# Patient Record
Sex: Male | Born: 1950 | Race: Black or African American | Hispanic: No | Marital: Married | Smoking: Former smoker
Health system: Southern US, Community
[De-identification: ages and names within clinical notes are randomized; demographics above are authoritative.]

## PROBLEM LIST (undated history)

## (undated) DIAGNOSIS — I10 Essential (primary) hypertension: Secondary | ICD-10-CM

## (undated) DIAGNOSIS — E039 Hypothyroidism, unspecified: Secondary | ICD-10-CM

## (undated) DIAGNOSIS — I639 Cerebral infarction, unspecified: Secondary | ICD-10-CM

## (undated) DIAGNOSIS — E785 Hyperlipidemia, unspecified: Secondary | ICD-10-CM

## (undated) DIAGNOSIS — R7303 Prediabetes: Secondary | ICD-10-CM

## (undated) DIAGNOSIS — I6522 Occlusion and stenosis of left carotid artery: Secondary | ICD-10-CM

---

## 2007-02-28 DIAGNOSIS — I2510 Atherosclerotic heart disease of native coronary artery without angina pectoris: Secondary | ICD-10-CM

## 2007-02-28 HISTORY — DX: Atherosclerotic heart disease of native coronary artery without angina pectoris: I25.10

## 2007-06-28 ENCOUNTER — Ambulatory Visit: Payer: Self-pay | Admitting: Thoracic Surgery (Cardiothoracic Vascular Surgery)

## 2007-08-14 ENCOUNTER — Ambulatory Visit: Payer: Self-pay | Admitting: Thoracic Surgery (Cardiothoracic Vascular Surgery)

## 2019-03-11 DIAGNOSIS — R42 Dizziness and giddiness: Secondary | ICD-10-CM | POA: Insufficient documentation

## 2019-03-11 DIAGNOSIS — I160 Hypertensive urgency: Secondary | ICD-10-CM

## 2019-03-11 HISTORY — DX: Hypertensive urgency: I16.0

## 2019-03-13 DIAGNOSIS — I519 Heart disease, unspecified: Secondary | ICD-10-CM | POA: Insufficient documentation

## 2019-03-13 DIAGNOSIS — I5189 Other ill-defined heart diseases: Secondary | ICD-10-CM | POA: Insufficient documentation

## 2019-03-17 DIAGNOSIS — R55 Syncope and collapse: Secondary | ICD-10-CM | POA: Diagnosis not present

## 2019-03-17 DIAGNOSIS — R001 Bradycardia, unspecified: Secondary | ICD-10-CM | POA: Diagnosis not present

## 2019-03-17 DIAGNOSIS — E039 Hypothyroidism, unspecified: Secondary | ICD-10-CM | POA: Diagnosis not present

## 2019-03-18 DIAGNOSIS — I10 Essential (primary) hypertension: Secondary | ICD-10-CM | POA: Diagnosis not present

## 2019-03-18 DIAGNOSIS — R001 Bradycardia, unspecified: Secondary | ICD-10-CM | POA: Diagnosis not present

## 2019-03-18 DIAGNOSIS — E039 Hypothyroidism, unspecified: Secondary | ICD-10-CM | POA: Diagnosis not present

## 2019-03-18 DIAGNOSIS — R55 Syncope and collapse: Secondary | ICD-10-CM | POA: Diagnosis not present

## 2019-03-18 DIAGNOSIS — I2510 Atherosclerotic heart disease of native coronary artery without angina pectoris: Secondary | ICD-10-CM | POA: Diagnosis not present

## 2019-03-18 DIAGNOSIS — I517 Cardiomegaly: Secondary | ICD-10-CM | POA: Diagnosis not present

## 2019-03-18 DIAGNOSIS — E785 Hyperlipidemia, unspecified: Secondary | ICD-10-CM | POA: Diagnosis not present

## 2019-03-18 DIAGNOSIS — R9089 Other abnormal findings on diagnostic imaging of central nervous system: Secondary | ICD-10-CM

## 2019-03-19 DIAGNOSIS — R55 Syncope and collapse: Secondary | ICD-10-CM | POA: Diagnosis not present

## 2019-03-19 DIAGNOSIS — I2510 Atherosclerotic heart disease of native coronary artery without angina pectoris: Secondary | ICD-10-CM | POA: Diagnosis not present

## 2019-03-19 DIAGNOSIS — R001 Bradycardia, unspecified: Secondary | ICD-10-CM | POA: Diagnosis not present

## 2019-03-19 DIAGNOSIS — E039 Hypothyroidism, unspecified: Secondary | ICD-10-CM | POA: Diagnosis not present

## 2019-03-19 DIAGNOSIS — E785 Hyperlipidemia, unspecified: Secondary | ICD-10-CM | POA: Diagnosis not present

## 2019-03-19 DIAGNOSIS — I10 Essential (primary) hypertension: Secondary | ICD-10-CM | POA: Diagnosis not present

## 2019-03-20 ENCOUNTER — Encounter (HOSPITAL_COMMUNITY): Payer: Self-pay | Admitting: Internal Medicine

## 2019-03-20 ENCOUNTER — Inpatient Hospital Stay (HOSPITAL_COMMUNITY)
Admission: AD | Admit: 2019-03-20 | Discharge: 2019-04-03 | DRG: 023 | Disposition: A | Discharge status: Rehabilitation Facility | Payer: BC Managed Care – PPO | Source: Other Acute Inpatient Hospital | Attending: Infectious Disease | Admitting: Infectious Disease

## 2019-03-20 ENCOUNTER — Other Ambulatory Visit: Payer: Self-pay

## 2019-03-20 DIAGNOSIS — E1151 Type 2 diabetes mellitus with diabetic peripheral angiopathy without gangrene: Secondary | ICD-10-CM | POA: Diagnosis present

## 2019-03-20 DIAGNOSIS — R339 Retention of urine, unspecified: Secondary | ICD-10-CM | POA: Diagnosis not present

## 2019-03-20 DIAGNOSIS — R338 Other retention of urine: Secondary | ICD-10-CM | POA: Diagnosis not present

## 2019-03-20 DIAGNOSIS — I771 Stricture of artery: Secondary | ICD-10-CM

## 2019-03-20 DIAGNOSIS — I513 Intracardiac thrombosis, not elsewhere classified: Secondary | ICD-10-CM | POA: Diagnosis present

## 2019-03-20 DIAGNOSIS — I6522 Occlusion and stenosis of left carotid artery: Secondary | ICD-10-CM | POA: Diagnosis not present

## 2019-03-20 DIAGNOSIS — E1169 Type 2 diabetes mellitus with other specified complication: Secondary | ICD-10-CM | POA: Diagnosis not present

## 2019-03-20 DIAGNOSIS — I63519 Cerebral infarction due to unspecified occlusion or stenosis of unspecified middle cerebral artery: Secondary | ICD-10-CM | POA: Diagnosis not present

## 2019-03-20 DIAGNOSIS — R001 Bradycardia, unspecified: Secondary | ICD-10-CM | POA: Diagnosis not present

## 2019-03-20 DIAGNOSIS — I639 Cerebral infarction, unspecified: Secondary | ICD-10-CM | POA: Diagnosis present

## 2019-03-20 DIAGNOSIS — I6523 Occlusion and stenosis of bilateral carotid arteries: Secondary | ICD-10-CM | POA: Diagnosis present

## 2019-03-20 DIAGNOSIS — D62 Acute posthemorrhagic anemia: Secondary | ICD-10-CM | POA: Diagnosis not present

## 2019-03-20 DIAGNOSIS — R55 Syncope and collapse: Secondary | ICD-10-CM | POA: Diagnosis not present

## 2019-03-20 DIAGNOSIS — I25810 Atherosclerosis of coronary artery bypass graft(s) without angina pectoris: Secondary | ICD-10-CM | POA: Diagnosis not present

## 2019-03-20 DIAGNOSIS — R7303 Prediabetes: Secondary | ICD-10-CM | POA: Diagnosis present

## 2019-03-20 DIAGNOSIS — E669 Obesity, unspecified: Secondary | ICD-10-CM | POA: Diagnosis not present

## 2019-03-20 DIAGNOSIS — R140 Abdominal distension (gaseous): Secondary | ICD-10-CM

## 2019-03-20 DIAGNOSIS — I10 Essential (primary) hypertension: Secondary | ICD-10-CM | POA: Diagnosis present

## 2019-03-20 DIAGNOSIS — I6602 Occlusion and stenosis of left middle cerebral artery: Secondary | ICD-10-CM | POA: Diagnosis not present

## 2019-03-20 DIAGNOSIS — N179 Acute kidney failure, unspecified: Secondary | ICD-10-CM | POA: Diagnosis not present

## 2019-03-20 DIAGNOSIS — E876 Hypokalemia: Secondary | ICD-10-CM | POA: Diagnosis present

## 2019-03-20 DIAGNOSIS — Y839 Surgical procedure, unspecified as the cause of abnormal reaction of the patient, or of later complication, without mention of misadventure at the time of the procedure: Secondary | ICD-10-CM | POA: Diagnosis not present

## 2019-03-20 DIAGNOSIS — J95821 Acute postprocedural respiratory failure: Secondary | ICD-10-CM | POA: Diagnosis not present

## 2019-03-20 DIAGNOSIS — I671 Cerebral aneurysm, nonruptured: Secondary | ICD-10-CM

## 2019-03-20 DIAGNOSIS — R251 Tremor, unspecified: Secondary | ICD-10-CM | POA: Diagnosis not present

## 2019-03-20 DIAGNOSIS — E871 Hypo-osmolality and hyponatremia: Secondary | ICD-10-CM | POA: Diagnosis not present

## 2019-03-20 DIAGNOSIS — Z7982 Long term (current) use of aspirin: Secondary | ICD-10-CM

## 2019-03-20 DIAGNOSIS — Z79899 Other long term (current) drug therapy: Secondary | ICD-10-CM

## 2019-03-20 DIAGNOSIS — R4189 Other symptoms and signs involving cognitive functions and awareness: Secondary | ICD-10-CM | POA: Diagnosis not present

## 2019-03-20 DIAGNOSIS — Z20822 Contact with and (suspected) exposure to covid-19: Secondary | ICD-10-CM | POA: Diagnosis present

## 2019-03-20 DIAGNOSIS — N400 Enlarged prostate without lower urinary tract symptoms: Secondary | ICD-10-CM | POA: Diagnosis present

## 2019-03-20 DIAGNOSIS — Z951 Presence of aortocoronary bypass graft: Secondary | ICD-10-CM

## 2019-03-20 DIAGNOSIS — I6302 Cerebral infarction due to thrombosis of basilar artery: Secondary | ICD-10-CM

## 2019-03-20 DIAGNOSIS — R29701 NIHSS score 1: Secondary | ICD-10-CM | POA: Diagnosis present

## 2019-03-20 DIAGNOSIS — I951 Orthostatic hypotension: Secondary | ICD-10-CM | POA: Diagnosis not present

## 2019-03-20 DIAGNOSIS — I6381 Other cerebral infarction due to occlusion or stenosis of small artery: Principal | ICD-10-CM | POA: Diagnosis present

## 2019-03-20 DIAGNOSIS — E039 Hypothyroidism, unspecified: Secondary | ICD-10-CM | POA: Diagnosis present

## 2019-03-20 DIAGNOSIS — F17210 Nicotine dependence, cigarettes, uncomplicated: Secondary | ICD-10-CM | POA: Diagnosis present

## 2019-03-20 DIAGNOSIS — I5032 Chronic diastolic (congestive) heart failure: Secondary | ICD-10-CM | POA: Diagnosis present

## 2019-03-20 DIAGNOSIS — B351 Tinea unguium: Secondary | ICD-10-CM | POA: Diagnosis present

## 2019-03-20 DIAGNOSIS — R1312 Dysphagia, oropharyngeal phase: Secondary | ICD-10-CM | POA: Diagnosis present

## 2019-03-20 DIAGNOSIS — R269 Unspecified abnormalities of gait and mobility: Secondary | ICD-10-CM | POA: Diagnosis not present

## 2019-03-20 DIAGNOSIS — E782 Mixed hyperlipidemia: Secondary | ICD-10-CM | POA: Diagnosis not present

## 2019-03-20 DIAGNOSIS — R310 Gross hematuria: Secondary | ICD-10-CM | POA: Diagnosis not present

## 2019-03-20 DIAGNOSIS — I6609 Occlusion and stenosis of unspecified middle cerebral artery: Secondary | ICD-10-CM | POA: Diagnosis present

## 2019-03-20 DIAGNOSIS — I2510 Atherosclerotic heart disease of native coronary artery without angina pectoris: Secondary | ICD-10-CM | POA: Diagnosis present

## 2019-03-20 DIAGNOSIS — I6529 Occlusion and stenosis of unspecified carotid artery: Secondary | ICD-10-CM | POA: Diagnosis present

## 2019-03-20 DIAGNOSIS — I959 Hypotension, unspecified: Secondary | ICD-10-CM | POA: Diagnosis present

## 2019-03-20 DIAGNOSIS — Z9911 Dependence on respirator [ventilator] status: Secondary | ICD-10-CM

## 2019-03-20 DIAGNOSIS — L602 Onychogryphosis: Secondary | ICD-10-CM | POA: Diagnosis present

## 2019-03-20 DIAGNOSIS — I70201 Unspecified atherosclerosis of native arteries of extremities, right leg: Secondary | ICD-10-CM | POA: Diagnosis present

## 2019-03-20 DIAGNOSIS — N390 Urinary tract infection, site not specified: Secondary | ICD-10-CM | POA: Diagnosis not present

## 2019-03-20 DIAGNOSIS — I161 Hypertensive emergency: Secondary | ICD-10-CM | POA: Diagnosis not present

## 2019-03-20 DIAGNOSIS — F17200 Nicotine dependence, unspecified, uncomplicated: Secondary | ICD-10-CM | POA: Diagnosis not present

## 2019-03-20 DIAGNOSIS — I663 Occlusion and stenosis of cerebellar arteries: Secondary | ICD-10-CM | POA: Diagnosis not present

## 2019-03-20 DIAGNOSIS — R509 Fever, unspecified: Secondary | ICD-10-CM

## 2019-03-20 DIAGNOSIS — I69391 Dysphagia following cerebral infarction: Secondary | ICD-10-CM | POA: Diagnosis not present

## 2019-03-20 DIAGNOSIS — I69351 Hemiplegia and hemiparesis following cerebral infarction affecting right dominant side: Secondary | ICD-10-CM | POA: Diagnosis present

## 2019-03-20 DIAGNOSIS — I110 Hypertensive heart disease with heart failure: Secondary | ICD-10-CM | POA: Diagnosis present

## 2019-03-20 DIAGNOSIS — R1310 Dysphagia, unspecified: Secondary | ICD-10-CM | POA: Diagnosis present

## 2019-03-20 DIAGNOSIS — I998 Other disorder of circulatory system: Secondary | ICD-10-CM | POA: Diagnosis not present

## 2019-03-20 DIAGNOSIS — Z978 Presence of other specified devices: Secondary | ICD-10-CM

## 2019-03-20 DIAGNOSIS — R7401 Elevation of levels of liver transaminase levels: Secondary | ICD-10-CM | POA: Diagnosis not present

## 2019-03-20 DIAGNOSIS — Z823 Family history of stroke: Secondary | ICD-10-CM

## 2019-03-20 DIAGNOSIS — R319 Hematuria, unspecified: Secondary | ICD-10-CM | POA: Diagnosis not present

## 2019-03-20 DIAGNOSIS — G8191 Hemiplegia, unspecified affecting right dominant side: Secondary | ICD-10-CM | POA: Diagnosis not present

## 2019-03-20 DIAGNOSIS — E785 Hyperlipidemia, unspecified: Secondary | ICD-10-CM | POA: Diagnosis present

## 2019-03-20 DIAGNOSIS — R0689 Other abnormalities of breathing: Secondary | ICD-10-CM | POA: Diagnosis not present

## 2019-03-20 DIAGNOSIS — I69398 Other sequelae of cerebral infarction: Secondary | ICD-10-CM | POA: Diagnosis not present

## 2019-03-20 DIAGNOSIS — N401 Enlarged prostate with lower urinary tract symptoms: Secondary | ICD-10-CM | POA: Diagnosis not present

## 2019-03-20 DIAGNOSIS — I739 Peripheral vascular disease, unspecified: Secondary | ICD-10-CM | POA: Diagnosis not present

## 2019-03-20 DIAGNOSIS — I340 Nonrheumatic mitral (valve) insufficiency: Secondary | ICD-10-CM

## 2019-03-20 DIAGNOSIS — J9600 Acute respiratory failure, unspecified whether with hypoxia or hypercapnia: Secondary | ICD-10-CM | POA: Diagnosis not present

## 2019-03-20 HISTORY — DX: Prediabetes: R73.03

## 2019-03-20 HISTORY — DX: Occlusion and stenosis of left carotid artery: I65.22

## 2019-03-20 HISTORY — DX: Essential (primary) hypertension: I10

## 2019-03-20 HISTORY — DX: Hyperlipidemia, unspecified: E78.5

## 2019-03-20 HISTORY — DX: Hypothyroidism, unspecified: E03.9

## 2019-03-20 LAB — CREATININE, SERUM
Creatinine, Ser: 1.14 mg/dL (ref 0.61–1.24)
GFR calc Af Amer: >60 mL/min (ref >60)
GFR calc non Af Amer: >60 mL/min (ref >60)

## 2019-03-20 LAB — GLUCOSE, CAPILLARY: Glucose-Capillary: 111 mg/dL — ABNORMAL HIGH (ref 70–99)

## 2019-03-20 LAB — HIV ANTIBODY (ROUTINE TESTING W REFLEX): HIV Screen 4th Generation wRfx: NONREACTIVE

## 2019-03-20 MED ORDER — STROKE: EARLY STAGES OF RECOVERY BOOK
Freq: Once | Status: AC
  Filled 2019-03-20: qty 1

## 2019-03-20 MED ORDER — ACETAMINOPHEN 160 MG/5ML PO SOLN
650.0000 mg | ORAL | Status: DC | PRN
  Administered 2019-04-01: 650 mg
  Filled 2019-03-20: qty 20.3

## 2019-03-20 MED ORDER — SODIUM CHLORIDE 0.9 % IV SOLN: INTRAVENOUS | Status: AC

## 2019-03-20 MED ORDER — ACETAMINOPHEN 650 MG RE SUPP: 650.0000 mg | RECTAL | Status: DC | PRN

## 2019-03-20 MED ORDER — INSULIN ASPART 100 UNIT/ML ~~LOC~~ SOLN
0.0000 [IU] | SUBCUTANEOUS | Status: DC
  Administered 2019-03-24 – 2019-03-29 (×3): 1 [IU] via SUBCUTANEOUS

## 2019-03-20 MED ORDER — ACETAMINOPHEN 325 MG PO TABS
650.0000 mg | ORAL_TABLET | ORAL | Status: DC | PRN
  Administered 2019-03-23 – 2019-04-02 (×10): 650 mg via ORAL
  Filled 2019-03-20 (×10): qty 2

## 2019-03-20 MED ORDER — HEPARIN SODIUM (PORCINE) 5000 UNIT/ML IJ SOLN
5000.0000 [IU] | Freq: Three times a day (TID) | INTRAMUSCULAR | Status: DC
  Administered 2019-03-20 – 2019-03-21 (×2): 5000 [IU] via SUBCUTANEOUS
  Filled 2019-03-20 (×3): qty 1

## 2019-03-20 NOTE — H&P (Addendum)
TRH H&P    Patient Demographics:    Jeremy Sherman, is a 69 y.o. male  MRN: [id number]  DOB - 1950/06/12  Admit Date - 03/20/2019  Referring MD/NP/PA: Newman Pies  Outpatient Primary MD for the patient is Imagene Riches, NP  Patient coming from: [facility]  Chief complaint- dizziness   HPI:    Jeremy Sherman  is a 69 y.o. 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    In ED bp 158/77,   CT brain  03/17/2019 8x57mm focal slight dense lesion in the left and anterior callosal region.    MRI brain 03/17/19 Early subacute infarct within the right cerebellum and right brachium pontis  Punctate acute left parietal lobe cortical infarct  9x54mm lobular focus of enhancement in the pericallosal region w imaging features most suggestive of ACA pericallosal aneurysm, CTA recommended.     Chronic lacunar infarct in the right caudate nucleus  CTA head/ neck 4x7 mm anuerysm left pericallosal segment left ACA without evidence of rupture.   Mild stenosis of right M1 segment,  Moderate to severe stenosis left M1 segment poststenotic dilation.  Mild stenoss in the PCA bilaterally  Atherosclerotic disease, mural thrombus in the prox left ICA narrowing the lumen by 50%.  Eccentric atherosclerotic dsiease vs focal dissection in the left ICA below the skull base without significant stenosis  TEE 03/20/19 EF 45-50% , left atrial appendage thrombus, and mild MR  03/20/19 Wbc 4.7, hgb 12.8, Plt 200 INR 1.0 TSH 57.2 Bun 29, Creatinine 1.1   03/19/19 T. Chol 245, TG 139, Hdl 29, LD  188 Hga1c=6.2  03/17/19 Sars covid-2 PCR negative  Pt will be admitted for stroke as well as  L ICA 50% stenosis and M1 stenosis and ACA pericallosal aneurysm 4x43mm      Review of systems:    In addition to the HPI above,  No Fever-chills, No Headache, No changes with Vision or hearing, No problems swallowing food or Liquids, No Chest pain, Cough or Shortness of Breath, No Abdominal pain, No Nausea or Vomiting, bowel movements are regular, No Blood in stool or Urine, No dysuria, No new skin rashes or bruises, No new joints pains-aches,  No new weakness, tingling, numbness in any extremity, No recent weight gain or loss, No polyuria, polydypsia or polyphagia, No significant Mental Stressors.  All other systems reviewed and are negative.    Past History of the following :    Past Medical History:  Diagnosis Date  . CAD (coronary artery disease)   . Carotid stenosis, left    L ICA 50%  . Hyperlipidemia   . Hypertension   . Hypertensive urgency 03/11/2019  . 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 Prediabetes       Past Surgical History:  Procedure Laterality Date  .  CORONARY ARTERY BYPASS GRAFT  07/02/2007   [place]-> LAD SVG-> PD branch of RCA [name]) at [facility]      Social History:      Social History   Tobacco Use  . Smoking status: Current Every Day Smoker    Packs/day: 0.50    Years: 45.00    Pack years: 22.50    Types: Cigarettes    Start date: 02/27/1974  . Smokeless tobacco: Never Used  Substance Use Topics  . Alcohol use: Not Currently       Family History :     Family History  Problem Relation Age of Onset  . Stroke Mother       Home Medications:   Prior to Admission medications   Medication Sig Start Date End Date Taking? Authorizing Provider  aspirin EC 81 MG tablet Take 81 mg by mouth daily.   Yes [provider]  omega-3 acid ethyl esters (LOVAZA) 1 g capsule Take 1 g by mouth daily.    Yes [provider]     Allergies:    No Known Allergies   Physical Exam:   Vitals  Blood pressure (!) 146/78, pulse 61, temperature 98.6 F (37 C), temperature source Oral, resp. rate 18, height 6\' 5"  (1.956 m), weight 106.1 kg, SpO2 99 %.  1.  General:  axoxo3  2. Psychiatric: euthymic  3. Neurologic: Nonfocal, cn2-12 intact, reflexes 2+ symmetric, diffuse with no clonus, motor 5/5 in all 4 ext Good finger to nose  4. HEENMT:  Anicteric, + arcus senilis, pupils 1.24mm symmetric, direct, consensual intact Neck: no jvd  5. Respiratory : CTAB  6. Cardiovascular : rrr s1, s2,   7. Gastrointestinal:  Abd: soft, nt, nd, +bs  8. Skin:  Ext: no c/c/e,  + dry skin on bilateral lower ext Onychomycosis (severe)  9.Musculoskeletal:  Good ROM    Data Review:    CBC No results for input(s): WBC, HGB, HCT, PLT, MCV, MCH, MCHC, RDW, LYMPHSABS, MONOABS, EOSABS, BASOSABS, BANDABS in the last 168 hours.  Invalid input(s): NEUTRABS, BANDSABD ------------------------------------------------------------------------------------------------------------------  Results for orders placed or performed during the hospital encounter of 03/20/19 (from the past 48 hour(s))  HIV Antibody (routine testing w rflx)     Status: None   Collection Time: 03/20/19  8:08 PM  Result Value Ref Range   HIV Screen 4th Generation wRfx NON REACTIVE NON REACTIVE    Comment: Performed at [facility], [address], [place], [place] [address]  Creatinine, serum     Status: None   Collection Time: 03/20/19  8:08 PM  Result Value Ref Range   Creatinine, Ser 1.14 0.61 - 1.24 mg/dL   GFR calc non Af Amer >60 >60 mL/min   GFR calc Af Amer >60 >60 mL/min    Comment: Performed at [facility] [address] [place], [place] [address]  Glucose, capillary     Status: Abnormal   Collection Time: 03/20/19 11:54 PM  Result Value Ref Range   Glucose-Capillary 111 (H) 70 - 99 mg/dL    Comment 1 Notify RN     Chemistries  Recent Labs  Lab 03/20/19 2008  CREATININE 1.14   ------------------------------------------------------------------------------------------------------------------  ------------------------------------------------------------------------------------------------------------------ GFR: Estimated Creatinine Clearance: 76 mL/min (by C-G formula based on SCr of 1.14 mg/dL). Liver Function Tests: No results for input(s): AST, ALT, ALKPHOS, BILITOT, PROT, ALBUMIN in the last 168 hours. No results for input(s): LIPASE, AMYLASE in the last 168 hours.  No results for input(s): AMMONIA in the last 168 hours. Coagulation Profile: No results for input(s): INR, PROTIME in the last 168 hours. Cardiac Enzymes: No results for input(s): CKTOTAL, CKMB, CKMBINDEX, TROPONINI in the last 168 hours. BNP (last 3 results) No results for input(s): PROBNP in the last 8760 hours. HbA1C: No results for input(s): HGBA1C in the last 72 hours. CBG: Recent Labs  Lab 03/20/19 2354  GLUCAP 111*   Lipid Profile: No results for input(s): CHOL, HDL, LDLCALC, TRIG, CHOLHDL, LDLDIRECT in the last 72 hours. Thyroid Function Tests: No results for input(s): TSH, T4TOTAL, FREET4, T3FREE, THYROIDAB in the last 72 hours. Anemia Panel: No results for input(s): VITAMINB12, FOLATE, FERRITIN, TIBC, IRON, RETICCTPCT in the last 72 hours.  --------------------------------------------------------------------------------------------------------------- Urine analysis: No results found for: COLORURINE, APPEARANCEUR, LABSPEC, PHURINE, GLUCOSEU, HGBUR, BILIRUBINUR, KETONESUR, PROTEINUR, UROBILINOGEN, NITRITE, LEUKOCYTESUR    Imaging Results:    No results found.     Assessment & Plan:    Active Problems:   Stroke ([place]) \ Acute bilateral strokes Right cerebellum , right brachium pontis PT/OT/ speech therapy Cont aspirin Cont Plavix Cont Statin Please consult neurology in  AM  M1 stenosis, left ACA aneurysm, Mild L ICA stenosis NPO after MN Hydrate with ns at 70mL per hour Dr. Estanislado Pandy was notified that patient has arrived Anticipate angiogram in AM to evaluate M1 stenosis and L ACA aneurysm   L atrial appendage clot Cont aspirin Cont plavix Consider anticoagulation after angiogram Please consult cardiology in Am  Syncope Multifactorial secondar to stroke polypharmacy causing bradycardia, hypotension Permissive hypertension, consider tightening bp control in am  Bradycardia ? Carvedilol vs hypothyroidism Carvedilol on hold Cont telemetry  Hypothyroidism TSH significantly elevated Cont Levothyroxine  Smoker counselled on smoking cessation x 75minutes  CAD s/p CABG Cont aspirin Cont Lipitor      DVT Prophylaxis-   Heparin- SCDs  AM Labs Ordered, also please review Full Orders  Family Communication: Admission, patients condition and plan of care including tests being ordered have been discussed with the patient who indicate understanding and agree with the plan and Code Status.  Code Status:  FULL CODE per patient,  Notified wife that patient arrived at [facility]  Admission status:  Inpatient: Based on patients clinical presentation and evaluation of above clinical data, I have made determination that patient meets Inpatient criteria at this time.   Time spent in minutes : 70 minutes   Jani Gravel M.D on 03/21/2019 at 4:42 AM

## 2019-03-20 NOTE — Progress Notes (Signed)
New Admission Note:  Arrival Method: via [place] from [place] Orientation: Alert and oriented to person, place time and situation Telemetry: Box 35 NSR Assessment: Completed Skin: Clean, dry and flaky on bilateral legs. Old scar in center of chest from CABG 2009 IV: Left and Right AC Pain: No complaints of pain Safety Measures: Safety Fall Prevention Plan was given, discussed. Admission: Completed 3W: Patient has been orientated to the room, unit and the staff. Family: No family at bedside, pt states wife will be on the way soon  Orders have been reviewed and implemented. Will continue to monitor the patient. Call light has been placed within reach and bed alarm has been activated.   Janus Molder ,RN

## 2019-03-21 ENCOUNTER — Inpatient Hospital Stay (HOSPITAL_COMMUNITY): Payer: BC Managed Care – PPO

## 2019-03-21 ENCOUNTER — Encounter (HOSPITAL_COMMUNITY): Payer: Self-pay | Admitting: Internal Medicine

## 2019-03-21 DIAGNOSIS — I671 Cerebral aneurysm, nonruptured: Secondary | ICD-10-CM | POA: Diagnosis present

## 2019-03-21 DIAGNOSIS — I998 Other disorder of circulatory system: Secondary | ICD-10-CM

## 2019-03-21 DIAGNOSIS — I6529 Occlusion and stenosis of unspecified carotid artery: Secondary | ICD-10-CM | POA: Diagnosis present

## 2019-03-21 DIAGNOSIS — I639 Cerebral infarction, unspecified: Secondary | ICD-10-CM

## 2019-03-21 DIAGNOSIS — I2510 Atherosclerotic heart disease of native coronary artery without angina pectoris: Secondary | ICD-10-CM

## 2019-03-21 DIAGNOSIS — I739 Peripheral vascular disease, unspecified: Secondary | ICD-10-CM

## 2019-03-21 DIAGNOSIS — E039 Hypothyroidism, unspecified: Secondary | ICD-10-CM

## 2019-03-21 HISTORY — PX: IR ANGIO INTRA EXTRACRAN SEL COM CAROTID INNOMINATE BILAT MOD SED: IMG5360

## 2019-03-21 HISTORY — PX: IR ANGIO VERTEBRAL SEL SUBCLAVIAN INNOMINATE BILAT MOD SED: IMG5366

## 2019-03-21 LAB — COMPREHENSIVE METABOLIC PANEL
ALT: 16 U/L (ref 0–44)
AST: 23 U/L (ref 15–41)
Albumin: 3.5 g/dL (ref 3.5–5.0)
Alkaline Phosphatase: 61 U/L (ref 38–126)
Anion gap: 13 (ref 5–15)
BUN: 15 mg/dL (ref 8–23)
CO2: 25 mmol/L (ref 22–32)
Calcium: 8.9 mg/dL (ref 8.9–10.3)
Chloride: 98 mmol/L (ref 98–111)
Creatinine, Ser: 1.12 mg/dL (ref 0.61–1.24)
GFR calc Af Amer: >60 mL/min (ref >60)
GFR calc non Af Amer: >60 mL/min (ref >60)
Glucose, Bld: 94 mg/dL (ref 70–99)
Potassium: 3.2 mmol/L — ABNORMAL LOW (ref 3.5–5.1)
Sodium: 136 mmol/L (ref 135–145)
Total Bilirubin: 0.9 mg/dL (ref 0.3–1.2)
Total Protein: 7.4 g/dL (ref 6.5–8.1)

## 2019-03-21 LAB — CBC
HCT: 39.4 % (ref 39.0–52.0)
Hemoglobin: 13.4 g/dL (ref 13.0–17.0)
MCH: 31.8 pg (ref 26.0–34.0)
MCHC: 34 g/dL (ref 30.0–36.0)
MCV: 93.6 fL (ref 80.0–100.0)
Platelets: UNDETERMINED 10*3/uL (ref 150–400)
RBC: 4.21 MIL/uL — ABNORMAL LOW (ref 4.22–5.81)
RDW: 15 % (ref 11.5–15.5)
WBC: 4.7 10*3/uL (ref 4.0–10.5)
nRBC: 0 % (ref 0.0–0.2)

## 2019-03-21 LAB — GLUCOSE, CAPILLARY
Glucose-Capillary: 98 mg/dL (ref 70–99)
Glucose-Capillary: 86 mg/dL (ref 70–99)
Glucose-Capillary: 80 mg/dL (ref 70–99)
Glucose-Capillary: 68 mg/dL — ABNORMAL LOW (ref 70–99)
Glucose-Capillary: 93 mg/dL (ref 70–99)
Glucose-Capillary: 108 mg/dL — ABNORMAL HIGH (ref 70–99)

## 2019-03-21 LAB — LIPID PANEL
Cholesterol: 243 mg/dL — ABNORMAL HIGH (ref 0–200)
HDL: 37 mg/dL — ABNORMAL LOW (ref >40)
LDL Cholesterol: 177 mg/dL — ABNORMAL HIGH (ref 0–99)
Total CHOL/HDL Ratio: 6.6 RATIO
Triglycerides: 147 mg/dL (ref <150)
VLDL: 29 mg/dL (ref 0–40)

## 2019-03-21 LAB — HEMOGLOBIN A1C
Hgb A1c MFr Bld: 6.4 % — ABNORMAL HIGH (ref 4.8–5.6)
Mean Plasma Glucose: 136.98 mg/dL

## 2019-03-21 LAB — PROTIME-INR
INR: 1 (ref 0.8–1.2)
Prothrombin Time: 13.2 seconds (ref 11.4–15.2)

## 2019-03-21 MED ORDER — LIDOCAINE HCL 1 % IJ SOLN
INTRAMUSCULAR | Status: AC | PRN
  Administered 2019-03-21: 10 mL

## 2019-03-21 MED ORDER — POTASSIUM CHLORIDE 10 MEQ/100ML IV SOLN
10.0000 meq | INTRAVENOUS | Status: AC
  Administered 2019-03-21 (×2): 10 meq via INTRAVENOUS
  Filled 2019-03-21 (×2): qty 100

## 2019-03-21 MED ORDER — FENTANYL CITRATE (PF) 100 MCG/2ML IJ SOLN
INTRAMUSCULAR | Status: AC | PRN
  Administered 2019-03-21: 25 ug via INTRAVENOUS

## 2019-03-21 MED ORDER — MIDAZOLAM HCL 2 MG/2ML IJ SOLN
INTRAMUSCULAR | Status: AC | PRN
  Administered 2019-03-21: 1 mg via INTRAVENOUS

## 2019-03-21 MED ORDER — VERAPAMIL HCL 2.5 MG/ML IV SOLN
INTRAVENOUS | Status: AC
  Filled 2019-03-21: qty 2

## 2019-03-21 MED ORDER — IOHEXOL 300 MG/ML  SOLN
150.0000 mL | Freq: Once | INTRAMUSCULAR | Status: AC | PRN
  Administered 2019-03-21: 12:00:00 70 mL via INTRA_ARTERIAL

## 2019-03-21 MED ORDER — HEPARIN (PORCINE) 25000 UT/250ML-% IV SOLN
900.0000 [IU]/h | INTRAVENOUS | Status: DC
  Administered 2019-03-21: 23:00:00 1300 [IU]/h via INTRAVENOUS
  Administered 2019-03-22: 19:00:00 900 [IU]/h via INTRAVENOUS
  Filled 2019-03-21 (×4): qty 250

## 2019-03-21 MED ORDER — MIDAZOLAM HCL 2 MG/2ML IJ SOLN
INTRAMUSCULAR | Status: AC
  Filled 2019-03-21: qty 2

## 2019-03-21 MED ORDER — HEPARIN SODIUM (PORCINE) 1000 UNIT/ML IJ SOLN
INTRAMUSCULAR | Status: AC | PRN
  Administered 2019-03-21: 1000 [IU] via INTRAVENOUS

## 2019-03-21 MED ORDER — CLOPIDOGREL BISULFATE 75 MG PO TABS
75.0000 mg | ORAL_TABLET | Freq: Every day | ORAL | Status: DC
  Administered 2019-03-22 – 2019-03-25 (×4): 75 mg via ORAL
  Filled 2019-03-21 (×4): qty 1

## 2019-03-21 MED ORDER — NITROGLYCERIN 1 MG/10 ML FOR IR/CATH LAB
INTRA_ARTERIAL | Status: AC
  Filled 2019-03-21: qty 10

## 2019-03-21 MED ORDER — SODIUM CHLORIDE 0.9 % IV SOLN: INTRAVENOUS | Status: AC

## 2019-03-21 MED ORDER — ATORVASTATIN CALCIUM 80 MG PO TABS
80.0000 mg | ORAL_TABLET | Freq: Every day | ORAL | Status: DC
  Administered 2019-03-21 – 2019-03-25 (×5): 80 mg via ORAL
  Filled 2019-03-21 (×5): qty 1

## 2019-03-21 MED ORDER — ASPIRIN EC 81 MG PO TBEC
81.0000 mg | DELAYED_RELEASE_TABLET | Freq: Every day | ORAL | Status: DC
  Administered 2019-03-21 – 2019-03-22 (×2): 81 mg via ORAL
  Filled 2019-03-21 (×2): qty 1

## 2019-03-21 MED ORDER — LIDOCAINE HCL 1 % IJ SOLN
INTRAMUSCULAR | Status: AC
  Filled 2019-03-21: qty 20

## 2019-03-21 MED ORDER — LEVOTHYROXINE SODIUM 100 MCG PO TABS
100.0000 ug | ORAL_TABLET | Freq: Every day | ORAL | Status: DC
  Administered 2019-03-21 – 2019-03-26 (×6): 100 ug via ORAL
  Filled 2019-03-21 (×6): qty 1

## 2019-03-21 MED ORDER — FENTANYL CITRATE (PF) 100 MCG/2ML IJ SOLN
INTRAMUSCULAR | Status: AC
  Filled 2019-03-21: qty 2

## 2019-03-21 MED ORDER — HEPARIN SODIUM (PORCINE) 1000 UNIT/ML IJ SOLN
INTRAMUSCULAR | Status: AC
  Filled 2019-03-21: qty 1

## 2019-03-21 MED ORDER — IOHEXOL 300 MG/ML  SOLN
50.0000 mL | Freq: Once | INTRAMUSCULAR | Status: AC | PRN
  Administered 2019-03-21: 12:00:00 5 mL via INTRA_ARTERIAL

## 2019-03-21 MED ORDER — POTASSIUM CHLORIDE 10 MEQ/100ML IV SOLN: 10.0000 meq | INTRAVENOUS | Status: DC

## 2019-03-21 NOTE — Procedures (Signed)
S/P 4 vessel c erebral arteriogram  RT CFA approach. Findings. 1.severe 90% LT MCA M 1 stenosis. 2.Approx 9.7 mm x 7 mm LT ACA pericallosal aneurysm. 3. Approx 75% LT ICA prox stenosis. S.Abayomi Pattison MD

## 2019-03-21 NOTE — Consult Note (Addendum)
NEURO HOSPITALIST  CONSULT   Requesting Physician: Dr. Darrick Meigs    Chief Complaint: stroke  History obtained from:  Chart  HPI:                                                                                                                                         Jeremy Sherman is an 69 y.o. male with PMH HTN, HLD, CAD, MI, CABG ( 2009), smoke, hypothyroidism. Presented to [facility] from [facility] for arteriogram.  Patient does not remember much as to what brought him to [place]. He remembers that he passed out, but states it was when he was getting some medication. He is aware of why he was transferred to [facility] ED and that he has stroke on MRI. Per chart review of records from [place] 03/17/19: patient had an episode of dizziness and confusion at home. He went to PCP's office and became unresponsive, hypotensive and diaphoretic. BP was reported lower than 100. Patient was brought via EMS to [facility]. Patient was hospitalized at [place] (1/12-1/14 for hypertensive urgency. Presented with SBP's in 200's over 100's. Was given lisinopril and started on nicardipine drip with good control of pressures. Was discharged on amlodipine 5mg , 10 mg of lisinopril and  3.125 mg coreg with SBP 120-150's. [facility] revealed grade 2 DD. Severe LVH EF 50-55%. CTA: 4x7 mm aneurysm left pericallosal segment left AVA w/o rupture. Severe stenosis of left M1  03/20/19: Dr. Dora Sims was consulted for left cerebral artery aneurysm and severe M1 stenosis.  CT scan at [place]: showed 8X61mm focal slightly dense lesion within the left and anterior callosal region.  MRI suggested: anterior cerebral artery pericallosal aneurysm. Showed an early/ subacute infarct with in the right cerebellum in the right brachium pontis.     Past Medical History:  Diagnosis Date  . CAD (coronary artery disease)   . Carotid stenosis, left    L ICA 50%  . Hyperlipidemia    . Hypertension   . Hypertensive urgency 03/11/2019  . 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 Prediabetes     Past Surgical History:  Procedure Laterality Date  . CORONARY ARTERY BYPASS GRAFT  07/02/2007   [place]-> LAD SVG-> PD branch of RCA [name]) at [facility]    Family History  Problem Relation Age of Onset  . Stroke Mother        Social History:  reports that he has been smoking cigarettes. He started smoking about 45 years ago. He has a 22.50 pack-year smoking history. He has never used smokeless tobacco. He reports previous alcohol use. No history  on file for drug.  Allergies: No Known Allergies  Medications:                                                                                                                           Scheduled: . atorvastatin  80 mg Oral q1800  . clopidogrel  75 mg Oral Daily  . fentaNYL      . heparin      . insulin aspart  0-9 Units Subcutaneous Q4H  . levothyroxine  100 mcg Oral Q0600  . lidocaine      . midazolam      . nitroGLYCERIN      . verapamil       Continuous: . sodium chloride 75 mL/hr at 03/21/19 1628   [id number] **OR** acetaminophen (TYLENOL) oral liquid 160 mg/5 mL **OR** acetaminophen   ROS:                                                                                                                                       ROS was performed and is negative except as noted in HPI   General Examination:                                                                                                      Blood pressure 98/80, pulse (!) 59, temperature 98.1 F (36.7 C), temperature source Oral, resp. rate 12, height 6\' 5"  (1.956 m), weight 106.1 kg, SpO2 97 %.  Physical Exam  Constitutional: Appears well-developed and well-nourished.  Psych: Affect appropriate to situation Eyes: Normal external eye and conjunctiva. HENT: Normocephalic, no lesions, without obvious abnormality.    Musculoskeletal-no joint tenderness, or swelling Bilateral feet with discoloration and long toe Cardiovascular: Normal rate and regular rhythm.  Respiratory: Effort normal, non-labored breathing saturations WNL GI: Soft.  No distension. There is no tenderness.  Skin: WDI, bilaterally feet with dark scaly patches of skin.  Neurological Examination Mental Status: Alert, oriented, thought content appropriate.  Speech fluent without evidence of aphasia.  Able to follow commands without difficulty. Cranial Nerves: II:  Visual fields grossly normal,  III,IV, VI: ptosis not present, extra-ocular motions intact bilaterally, pupils equal, round, reactive to light and accommodation V,VII: smile symmetric, facial light touch sensation normal bilaterally VIII: hearing normal bilaterally IX,X: uvula rises midline XI: bilateral shoulder shrug XII: midline tongue extension Motor: Right : Upper extremity   5/5  Left:     Upper extremity   5/5  Lower extremity   5/5   Lower extremity   5/5 Tone and bulk:normal tone throughout; no atrophy noted Sensory:  light touch intact throughout, bilaterally Deep Tendon Reflexes: 3+ and symmetric patella, 2+ and symmetric biceps Plantars: Right: downgoing   Left: downgoing Cerebellar: No ataxia Gait: deferred   Lab Results: Basic Metabolic Panel: Recent Labs  Lab 03/20/19 2008 03/21/19 0413  NA  --  136  K  --  3.2*  CL  --  98  CO2  --  25  GLUCOSE  --  94  BUN  --  15  CREATININE 1.14 1.12  CALCIUM  --  8.9    CBC: Recent Labs  Lab 03/21/19 0413  WBC 4.7  HGB 13.4  HCT 39.4  MCV 93.6  PLT PLATELET CLUMPS NOTED ON SMEAR, UNABLE TO ESTIMATE    Lipid Panel: Recent Labs  Lab 03/21/19 0413  CHOL 243*  TRIG 147  HDL 37*  CHOLHDL 6.6  VLDL 29  LDLCALC 177*    CBG: Recent Labs  Lab 03/20/19 2354 03/21/19 0335 03/21/19 0745  GLUCAP 111* 98 86    Imaging: VAS [place] ABI WITH/WO TBI  Result Date: 03/21/2019 LOWER EXTREMITY  DOPPLER STUDY Indications: Peripheral artery disease, and cold right foot post op angio. High Risk Factors: Hypertension, hyperlipidemia, coronary artery disease.  Comparison Study: no prior Performing Technologist: Abram Sander RVS  Examination Guidelines: A complete evaluation includes at minimum, Doppler waveform signals and systolic blood pressure reading at the level of bilateral brachial, anterior tibial, and posterior tibial arteries, when vessel segments are accessible. Bilateral testing is considered an integral part of a complete examination. Photoelectric Plethysmograph (PPG) waveforms and toe systolic pressure readings are included as required and additional duplex testing as needed. Limited examinations for reoccurring indications may be performed as noted.  ABI Findings: +--------+------------------+-----+---------+-----------+ Right   Rt Pressure (mmHg)IndexWaveform Comment     +--------+------------------+-----+---------+-----------+ [id number]                    triphasic            +--------+------------------+-----+---------+-----------+ PTA                                     not audible +--------+------------------+-----+---------+-----------+ PERO                                                +--------+------------------+-----+---------+-----------+ DP                                      not audible +--------+------------------+-----+---------+-----------+ +--------+------------------+-----+---------+-----------+ Left    Lt Pressure (mmHg)IndexWaveform Comment     +--------+------------------+-----+---------+-----------+ [id number]  triphasic            +--------+------------------+-----+---------+-----------+ PTA                                     not audible +--------+------------------+-----+---------+-----------+ DP                                      not audible  +--------+------------------+-----+---------+-----------+  Summary: Right: Unable to obtain DP and PTA. Not audbile. Right arterial duplex showed occluded SFA and popliteal artery. Left: Unable to obtain DP and PTA. Not audbile.  *See table(s) above for measurements and observations.    Preliminary    VAS [place] LOWER EXTREMITY ARTERIAL DUPLEX  Result Date: 03/21/2019 LOWER EXTREMITY ARTERIAL DUPLEX STUDY Indications: Peripheral artery disease.  Current ABI: n/a Limitations: right foot cool post angio Comparison Study: no prior Performing Technologist: Abram Sander RVS  Examination Guidelines: A complete evaluation includes B-mode imaging, spectral Doppler, color Doppler, and power Doppler as needed of all accessible portions of each vessel. Bilateral testing is considered an integral part of a complete examination. Limited examinations for reoccurring indications may be performed as noted.  +-----------+--------+-----+--------+----------+--------+ RIGHT      PSV cm/sRatioStenosisWaveform  Comments +-----------+--------+-----+--------+----------+--------+ CFA Prox   108                  biphasic           +-----------+--------+-----+--------+----------+--------+ DFA        47                   monophasic         +-----------+--------+-----+--------+----------+--------+ SFA Prox                occluded                   +-----------+--------+-----+--------+----------+--------+ SFA Mid                 occluded                   +-----------+--------+-----+--------+----------+--------+ SFA Distal              occluded                   +-----------+--------+-----+--------+----------+--------+ POP Prox                occluded                   +-----------+--------+-----+--------+----------+--------+ ATA Distal 9                    monophasic         +-----------+--------+-----+--------+----------+--------+ PTA Distal 16                   monophasic          +-----------+--------+-----+--------+----------+--------+ PERO Distal10                   monophasic         +-----------+--------+-----+--------+----------+--------+   Summary: Right: Total occlusion noted in the superficial femoral artery. Total occlusion noted in the superficial femoral artery and/or popliteal artery. Total occlusion noted in the popliteal artery.  See table(s) above for measurements and observations.    Preliminary     ECHO at [place]: 1. Technically a  difficult study without suprasternal views 2. Severe concentric left ventricular hypertrophy 3. Mild global hypokinesis of LV 4. Overall left ventricular systolic function is mildly impaired with and EF between 45-50 5.psuedo normal LV filling pattern consistent with elevated LA pressure 6. The right atrium is moderately;y enlarged 7. 2 agitated saline contrast injections at rest and valsalvae w/o shunt 8. Flow patterns measured by doppler indicate systolic blunting and elevated LA pressure.     Bilateral cortid duplex: Right: minimal heterogenous plaque, with no significant stenosis. Left: heterogenous plaque and left carotid bifurcation. Peak velocity suggest less than 50% stenosis with ICA/CCA ration 50-69% stenosis   TEE:  1. Overall left ventricular systolic function  Mildly impaired EF between 45-50% 2. Right ventrical is normal 3. Left atrium not well visualized. There is a slot in the left atrial appendage. 4. Negative bubble study 5. Aortic valve is trileaflet and is mildly thickened. 6. Moderate aortic valve sclerosis 7. Mitral valve normal 8. Mild mitral regurgitation 9. Tricuspid valve appears structurally  normal 10. Pulmonic valve not well visualized 11. Pulmonic valve is normal 12. There is thrombus located in left atrial appendage 13. Pericardium is normal  Laurey Morale, MSN, NP-C [facility] [phone]  03/21/2019, 11:35 AM   Attending physician note to follow with  Assessment and plan .   Assessment: 69 y.o. male   with PMH HTN, HLD, CAD, MI, CABG ( 2009), smoking, hypothyroidism.  He has a small brachium pontis infarct as well as a left frontal infarct.  The left frontal infarct is punctate.  It is possible that these represent decompensated small vessel disease in the setting of syncope, but the cerebellar infarct is less likely.   Stroke Risk Factors - hyperlipidemia, hypertension and smoking    Recommendations:  -- Prophylactic therapy-aspirin 81 mg daily with Plavix 75 mg daily for 3 weeks followed by monotherapy. -- High intensity Statin  -- PT consult, OT consult, Speech consult --Follow-up recommendations of Dr. Estanislado Pandy --please page stroke NP  Or  PA  Or MD from 8am -4 pm  as this patient from this time will be  followed by the stroke.   You can look them up on [email or web address]  Password [email or web address]  Roland Rack, MD [facility] [phone]  If 7pm- 7am, please page neurology on call as listed in [facility].

## 2019-03-21 NOTE — Progress Notes (Signed)
Patient ID: Jeremy Sherman, male   DOB: 1950-10-24, 69 y.o.   MRN: [id number] INR. Post procedure Neurologically stable . Prior to the procedure RT DP and PT dopplerable . LT DP dopplerable with absent LT PT . Post procedure LT DP dopplerable  And Lt PT absent unchanged. RT DP and PT not dopplerable. Both feet warm to the ankles.. Patient not complaining   of any pain or paresthesias in the lower extremities. Both common femoral pulses palpable. Have requested vascular surgery consult regarding above findings.Marland Kitchen S.Keaira Whitehurst MD

## 2019-03-21 NOTE — Progress Notes (Addendum)
ANTICOAGULATION CONSULT NOTE - Initial Consult  Pharmacy Consult for Heparin Indication: Left Atrial Appendage Thrombus  No Known Allergies  Patient Measurements: Height: 6\' 5"  (195.6 cm) Weight: 233 lb 14.5 oz (106.1 kg) IBW/kg (Calculated) : 89.1 Heparin Dosing Weight: 106.1 kg   Vital Signs: Temp: 98.7 F (37.1 C) (01/22 1532) Temp Source: Oral (01/22 1532) BP: 149/70 (01/22 1532) Pulse Rate: 53 (01/22 1532)  Labs: Recent Labs    03/20/19 2008 03/21/19 0413  HGB  --  13.4  HCT  --  39.4  PLT  --  PLATELET CLUMPS NOTED ON SMEAR, UNABLE TO ESTIMATE  LABPROT  --  13.2  INR  --  1.0  CREATININE 1.14 1.12    Estimated Creatinine Clearance: 77.3 mL/min (by C-G formula based on SCr of 1.12 mg/dL).   Medical History: Past Medical History:  Diagnosis Date  . CAD (coronary artery disease)   . Carotid stenosis, left    L ICA 50%  . Hyperlipidemia   . Hypertension   . Hypertensive urgency 03/11/2019  . 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 Prediabetes     Assessment: Pharmacy is consulted to dose heparin in 69 yr old male with left atrial appendage thrombus shown on TEE from 03/20/19 at [facility]. MRI of brain on 03/17/19 showed old stroke (subacute infarct within right cerebellum and right brachium pontis, acute left parietal lobe cortical infarct, chronic lacunar infarct in right caudate nucleus), and enhancement suggestive of ACA pericallosal aneurysm. CTA showed middle cerebral artery stenosis; pt is S/P 4-vessel cerebral arteriogram this afternoon. Dr. Darrick Meigs indicated that Dr. Estanislado Pandy and Cardiology are okay with starting heparin infusion. I spoke with Dr. Estanislado Pandy re: heparin dose intensity and goal; he recommended using heparin level goal of 0.3-0.5 units/ml and using stroke dosing intensity (no bolus).  Pt also with right foot ischemia and was found to have absent peripheral pulses; Vascular Surgery has been consulted.  Pt was receiving SQ heparin 5000  units every 8 hrs here (last dose at 0700 today). Pt also rec'd heparin 1000 units IV X 1during arteriogram at [id number] today). H/H WNL; INR 1.0. Pt was not on anticoagulation PTA, per med rec (confirmed with pt).   Goal of Therapy:  Heparin level: 0.3-0.5 units/ml Monitor platelets by anticoagulation protocol: Yes   Plan:  Start heparin infusion at 1300 units/hr (no bolus) Monitor 6-hr heparin level Monitor daily heparin level, CBC Monitor for signs/symptoms of bleeding   Gillermina Hu, PharmD, BCPS, [facility] Clinical Pharmacist 03/21/2019,5:39 PM

## 2019-03-21 NOTE — Progress Notes (Signed)
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  Apparently presented to [facility] ER on 03/17/2019 for dizziness and syncope at the PCP office.  Patient was unresponsive, hypotensive and diaphoretic.  In ED bp 158/77,   CT brain  03/17/2019 8x16mm focal slight dense lesion in the left and anterior callosal region.    MRI brain 03/17/19 Early subacute infarct within the right cerebellum and right brachium pontis  Punctate acute left parietal lobe cortical infarct  9x83mm lobular focus of enhancement in the pericallosal region w imaging features most suggestive of ACA pericallosal aneurysm, CTA recommended.     Chronic lacunar infarct in the right caudate nucleus  CTA head/ neck 4x7 mm anuerysm left pericallosal segment left ACA without evidence of rupture.   Mild stenosis of right M1 segment,  Moderate to severe stenosis left M1 segment poststenotic dilation.  Mild stenoss in the PCA bilaterally  Atherosclerotic disease, mural thrombus in the prox left ICA narrowing the lumen by 50%.  Eccentric atherosclerotic dsiease vs focal dissection in the left ICA below the skull base without significant stenosis  TEE 03/20/19 EF 45-50% , left atrial appendage thrombus, and mild MR   Subjective   Patient seen and examined, underwent cerebral angiogram today.  Patient was found to have absent peripheral pulses in both feet, vascular surgery was consulted by neuro interventional radiology.   Assessment/Plan:     1. M1 stenosis, left ACA aneurysm, mild L ICA stenosis-patient underwent cerebral arteriogram per neuro interventional  neurology.  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  Plan for intervention early next week as per Dr. Estanislado Pandy.  Patient is currently on Plavix 75 mg p.o. daily.  2. Left atrial appendage thrombus-patient has left atrial appendage thrombus, seen on TEE from 03/20/2019 at [facility] and discussed with cardiologist Dr. Sallyanne Kuster, he recommends starting anticoagulation immediately if okay with neuro intervention radiology.  I called and discussed with Dr. Estanislado Pandy, he is okay with starting anticoagulation.  Will start heparin at this time in case patient needs procedure as per vascular surgery.  3. Right foot ischemia-patient was found to have absent peripheral pulses in right foot, distal surgery was consulted.  Dr. Eden Lathe has seen the patient and ordered duplex ultrasound of the right lower extremity with bilateral ABIs.  Further management as per vascular surgery.  4. Bilateral stroke, right cerebellum, right brachium pontis-patient was taking aspirin 81 mg daily at home.  He has been started here on Plavix, aspirin has not been restarted.  Continue statin.  Neurology has been consulted for further recommendations.  5. Syncope-multifactorial likely from polypharmacy causing bradycardia, hypotension, permissive hypertension.  Continue to monitor patient blood pressure in the hospital.  6. Bradycardia-likely from carvedilol which is on hold.  Patient also has hypothyroidism.  7. Hypothyroidism-TSH was significantly elevated 111 with T3 and T4 lower than normal.  On Synthroid 100 mcg daily.  Will recheck TSH, T3 and T4.  8. Hypokalemia-potassium was 3.2, replace potassium and check BMP in a.m.    SpO2: 100 % O2 Flow Rate (L/min): 2 L/min      CBG:  Recent Labs  Lab 03/20/19 2354 03/21/19 0335 03/21/19 0745 03/21/19 1406  GLUCAP 111* 98 86 80    CBC: Recent Labs  Lab 03/21/19 0413  WBC 4.7   HGB 13.4  HCT 39.4  MCV 93.6  PLT PLATELET CLUMPS NOTED ON SMEAR, UNABLE TO ESTIMATE    Basic Metabolic Panel: Recent Labs  Lab 03/20/19 2008 03/21/19 0413  NA  --  136  K  --  3.2*  CL  --  98  CO2  --  25  GLUCOSE  --  94  BUN  --  15  CREATININE 1.14 1.12  CALCIUM  --  8.9     Liver Function Tests: Recent Labs  Lab 03/21/19 0413  AST 23  ALT 16  ALKPHOS 61  BILITOT 0.9  PROT 7.4  ALBUMIN 3.5        DVT prophylaxis: Heparin  Code Status: Full code  Family Communication: No family at bedside  Disposition Plan: likely home when medically ready for discharge       Scheduled medications:  . atorvastatin  80 mg Oral q1800  . clopidogrel  75 mg Oral Daily  . fentaNYL      . heparin      . heparin  5,000 Units Subcutaneous Q8H  . insulin aspart  0-9 Units Subcutaneous Q4H  . levothyroxine  100 mcg Oral Q0600  . lidocaine      . midazolam      . nitroGLYCERIN      . verapamil        Consultants:  Neuro intervention radiology  Vascular surgery  Neurology  Procedures:  Cerebral arteriogram  Antibiotics:   Anti-infectives (From admission, onward)   None       Objective   Vitals:   03/21/19 1255 03/21/19 1305 03/21/19 1357 03/21/19 1532  BP: 132/74 137/76 139/88 (!) 149/70  Pulse: 62 62 (!) 58 (!) 53  Resp: 16 16 18 16   Temp:   98.4 F (36.9 C) 98.7 F (37.1 C)  TempSrc:   Oral Oral  SpO2: 100% 97% 99% 100%  Weight:      Height:        Intake/Output Summary (Last 24 hours) at 03/21/2019 1713 Last data filed at 03/21/2019 0551 Gross per 24 hour  Intake 311.95 ml  Output 200 ml  Net 111.95 ml    01/20 1901 - 01/22 0700 In: 312 [I.V.:312] Out: 200 [Urine:200]  Filed Weights   03/20/19 1842  Weight: 106.1 kg    Physical Examination:    General-appears in no acute distress  Heart-S1-S2, regular, no murmur auscultated  Lungs-clear to auscultation bilaterally, no wheezing or crackles  auscultated  Abdomen-soft, nontender, no organomegaly  Extremities-no edema in the lower extremities  Neuro-alert, oriented x3, no focal deficit noted     Studies:  VAS [place] ABI WITH/WO TBI  Result Date: 03/21/2019 LOWER EXTREMITY DOPPLER STUDY Indications: Peripheral artery disease, and cold right foot post op angio. High Risk Factors: Hypertension, hyperlipidemia, coronary artery disease.  Comparison Study: no prior Performing Technologist: Abram Sander RVS  Examination Guidelines: A complete evaluation includes at minimum, Doppler waveform signals and systolic blood pressure reading at the level of bilateral brachial, anterior tibial, and posterior tibial arteries, when vessel segments are accessible. Bilateral testing is considered an integral part of a complete examination. Photoelectric Plethysmograph (PPG) waveforms and toe systolic pressure readings are included as required and additional duplex testing as needed. Limited examinations for reoccurring indications may be performed as  noted.  ABI Findings: +--------+------------------+-----+---------+-----------+ Right   Rt Pressure (mmHg)IndexWaveform Comment     +--------+------------------+-----+---------+-----------+ [id number]                    triphasic            +--------+------------------+-----+---------+-----------+ PTA                                     not audible +--------+------------------+-----+---------+-----------+ PERO                                                +--------+------------------+-----+---------+-----------+ DP                                      not audible +--------+------------------+-----+---------+-----------+ +--------+------------------+-----+---------+-----------+ Left    Lt Pressure (mmHg)IndexWaveform Comment     +--------+------------------+-----+---------+-----------+ [id number]                    triphasic             +--------+------------------+-----+---------+-----------+ PTA                                     not audible +--------+------------------+-----+---------+-----------+ DP                                      not audible +--------+------------------+-----+---------+-----------+  Summary: Right: Unable to obtain DP and PTA. Not audbile. Right arterial duplex showed occluded SFA and popliteal artery. Left: Unable to obtain DP and PTA. Not audbile.  *See table(s) above for measurements and observations.    Preliminary    VAS [place] LOWER EXTREMITY ARTERIAL DUPLEX  Result Date: 03/21/2019 LOWER EXTREMITY ARTERIAL DUPLEX STUDY Indications: Peripheral artery disease.  Current ABI: n/a Limitations: right foot cool post angio Comparison Study: no prior Performing Technologist: Abram Sander RVS  Examination Guidelines: A complete evaluation includes B-mode imaging, spectral Doppler, color Doppler, and power Doppler as needed of all accessible portions of each vessel. Bilateral testing is considered an integral part of a complete examination. Limited examinations for reoccurring indications may be performed as noted.  +-----------+--------+-----+--------+----------+--------+ RIGHT      PSV cm/sRatioStenosisWaveform  Comments +-----------+--------+-----+--------+----------+--------+ CFA Prox   108                  biphasic           +-----------+--------+-----+--------+----------+--------+ DFA        47                   monophasic         +-----------+--------+-----+--------+----------+--------+ SFA Prox                occluded                   +-----------+--------+-----+--------+----------+--------+ SFA Mid                 occluded                   +-----------+--------+-----+--------+----------+--------+ SFA Distal  occluded                   +-----------+--------+-----+--------+----------+--------+ POP Prox                occluded                    +-----------+--------+-----+--------+----------+--------+ ATA Distal 9                    monophasic         +-----------+--------+-----+--------+----------+--------+ PTA Distal 16                   monophasic         +-----------+--------+-----+--------+----------+--------+ PERO Distal10                   monophasic         +-----------+--------+-----+--------+----------+--------+   Summary: Right: Total occlusion noted in the superficial femoral artery. Total occlusion noted in the superficial femoral artery and/or popliteal artery. Total occlusion noted in the popliteal artery.  See table(s) above for measurements and observations.    Preliminary      Admission status: Inpatient: Based on patients clinical presentation and evaluation of above clinical data, I have made determination that patient meets Inpatient criteria at this time.   Oswald Hillock   [facility] If 7PM-7AM, please contact night-coverage at [email or web address], Office  [phone]  password [email or web address]  03/21/2019, 5:13 PM  LOS: 1 day

## 2019-03-21 NOTE — Progress Notes (Signed)
SLP Cancellation Note  Patient Details Name: Franchesco Dubray MRN: [id number] DOB: [birth year]   Cancelled treatment:        Attempted to see pt for speech evaluation.  Pt transporting to IR imminently for procedure.  Will reatttempt as schedule permits.   Celedonio Savage, MA, [facility] Capo Office: [phone] 03/21/2019, 9:26 AM

## 2019-03-21 NOTE — Consult Note (Signed)
Chief Complaint: Patient was seen in consultation today for diagnostic cerebral angiogram  Referring Physician(s): Dr. Jani Gravel  Supervising Physician: Luanne Bras  Patient Status: [facility] - In-pt  History of Present Illness: Jeremy Sherman is a 69 y.o. male with a past medical history significant for hypothyroidism, HTN with recent admission for HTN urgency (02/2019) HLD, CAD s/p CABG (2009), ongoing tobacco use who presented to [facility] ED on 03/17/19 from his PCPs office where he was being seen for dizziness and syncope - he was found to be hypotensive, diaphoretic and minimally responsive. CT brain significant for 8 x 6 mm focal slight dense lesion in the left and anterior callosal region. MRI brain significant for early subacute infarct within the right cerebellum and right brachium pontis, punctate acute left parietal lobe cortical infarct, 9 x 7 mm lobular focus of enhancement in the pericallosal region with imaging features most suggestive of ACA pericallosal aneurysm and chronic lacunar infarct in the right caudate nucleus. CTA head/neck notable for 4 x 7 mm aneurysm left pericallosal segment left ACA without evidence of rupture, mild stenosis of right M1 segment, moderate to severe stenosis left M1 segment poststenotic dilation, mild stenosis in the PCA bilaterally, mural thrombus in the proximal left ICA narrowing the lumen by 50%. He was transferred to [facility] for further evaluation and management. NIR has been asked to perform a diagnostic cerebral angiogram for further treatment planning.  Patient sitting up in bed watching TV, just finished using the urinal. He states he feels well today, he is aware that he will be having a test on his head today but he does not remember what it was called. He does not remember if had any trouble with his speech or movement, just that he felt very poorly. He states understanding of the requested procedure and is agreeable to  proceed.   Past Medical History:  Diagnosis Date  . CAD (coronary artery disease)   . Carotid stenosis, left    L ICA 50%  . Hyperlipidemia   . Hypertension   . Hypertensive urgency 03/11/2019  . 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 Prediabetes     Past Surgical History:  Procedure Laterality Date  . CORONARY ARTERY BYPASS GRAFT  07/02/2007   [place]-> LAD SVG-> PD branch of RCA [name]) at [facility]    Allergies: Patient has no known allergies.  Medications: Prior to Admission medications   Medication Sig Start Date End Date Taking? Authorizing Provider  aspirin EC 81 MG tablet Take 81 mg by mouth daily.   Yes [provider]  omega-3 acid ethyl esters (LOVAZA) 1 g capsule Take 1 g by mouth daily.   Yes [provider]     Family History  Problem Relation Age of Onset  . Stroke Mother     Social History   Socioeconomic History  . Marital status: Married    Spouse name: Not on file  . Number of children: Not on file  . Years of education: Not on file  . Highest education level: Not on file  Occupational History  . Not on file  Tobacco Use  . Smoking status: Current Every Day Smoker    Packs/day: 0.50    Years: 45.00    Pack years: 22.50    Types: Cigarettes    Start date: 02/27/1974  . Smokeless tobacco: Never Used  Substance and Sexual Activity  . Alcohol use: Not Currently  . Drug  use: Not on file  . Sexual activity: Not on file  Other Topics Concern  . Not on file  Social History Narrative  . Not on file   Social Determinants of Health   Financial Resource Strain:   . Difficulty of Paying Living Expenses: Not on file  Food Insecurity:   . Worried About Charity fundraiser in the Last Year: Not on file  . Ran Out of Food in the Last Year: Not on file  Transportation Needs:   . Lack of Transportation (Medical): Not on file  . Lack of Transportation (Non-Medical): Not on file  Physical Activity:   .  Days of Exercise per Week: Not on file  . Minutes of Exercise per Session: Not on file  Stress:   . Feeling of Stress : Not on file  Social Connections:   . Frequency of Communication with Friends and Family: Not on file  . Frequency of Social Gatherings with Friends and Family: Not on file  . Attends Religious Services: Not on file  . Active Member of Clubs or Organizations: Not on file  . Attends Archivist Meetings: Not on file  . Marital Status: Not on file     Review of Systems: A 12 point ROS discussed and pertinent positives are indicated in the HPI above.  All other systems are negative.  Review of Systems  Constitutional: Negative for chills and fever.  Respiratory: Negative for cough and shortness of breath.   Cardiovascular: Negative for chest pain.  Gastrointestinal: Negative for abdominal pain, diarrhea, nausea and vomiting.  Skin: Negative for wound.  Neurological: Positive for dizziness (none currently). Negative for facial asymmetry, speech difficulty, weakness, light-headedness, numbness and headaches.  Psychiatric/Behavioral: Negative for confusion.    Vital Signs: BP 121/86 (BP Location: Right Arm)   Pulse (!) 54   Temp 98.1 F (36.7 C) (Oral)   Resp 16   Ht 6\' 5"  (1.956 m)   Wt 233 lb 14.5 oz (106.1 kg)   SpO2 98%   BMI 27.74 kg/m   Physical Exam Vitals and nursing note reviewed.  HENT:     Head: Normocephalic.     Mouth/Throat:     Mouth: Mucous membranes are moist.     Pharynx: Oropharynx is clear. No oropharyngeal exudate or posterior oropharyngeal erythema.  Cardiovascular:     Rate and Rhythm: Normal rate and regular rhythm.  Pulmonary:     Effort: Pulmonary effort is normal.     Breath sounds: Normal breath sounds.  Abdominal:     General: There is no distension.     Palpations: Abdomen is soft.     Tenderness: There is no abdominal tenderness.  Skin:    General: Skin is warm and dry.  Neurological:     Mental Status: He is  alert.   Alert, awake, and oriented x 3 Speech and comprehension in tact PERRL bilaterally EOMs withou nystagmus or subjective diplopia. Visual fields grossly intact No obvious facial asymmetry. Tongue midline Motor power full in all 4 extremities Negative pronator drift. Fine motor and coordination grossly in tact Gait not assessed Romberg not assessed Heel to toe not assessed Distal pulses not assessed    MD Evaluation Airway: WNL Heart: WNL Abdomen: WNL Chest/ Lungs: WNL ASA  Classification: 3 Mallampati/Airway Score: Two   Imaging: No results found.  Labs:  CBC: Recent Labs    03/21/19 0413  WBC 4.7  HGB 13.4  HCT 39.4  PLT PENDING  COAGS: Recent Labs    03/21/19 0413  INR 1.0    BMP: Recent Labs    03/20/19 2008 03/21/19 0413  NA  --  136  K  --  3.2*  CL  --  98  CO2  --  25  GLUCOSE  --  94  BUN  --  15  CALCIUM  --  8.9  CREATININE 1.14 1.12  GFRNONAA >60 >60  GFRAA >60 >60    LIVER FUNCTION TESTS: Recent Labs    03/21/19 0413  BILITOT 0.9  AST 23  ALT 16  ALKPHOS 61  PROT 7.4  ALBUMIN 3.5    TUMOR MARKERS: No results for input(s): AFPTM, CEA, CA199, CHROMGRNA in the last 8760 hours.  Assessment and Plan:  69 y/o M who [facility] ED on [facility]/17/19 with complaints of dizziness and syncope - he was found to be hypotensive, diaphoretic and poorly responsive. CTA head/neck notable for M1 stenosis, left ACA aneurysm and mild left ICA stenosis - NIR has been asked to perform a diagnostic cerebral angiogram to further guide treatment planning.  Plan to proceed with diagnostic angiogram today in NIR pending any emergent procedures.  Patient has been NPO since midnight and is to remain NPO until post procedure. Afebrile, WBC 4.7, hgb 13.4, plt clumped on smear, creatinine 1.12, INR 1.0.   Risks and benefits of diagnostic cerebral angiogram were discussed with the patient including, but not limited to bleeding,  infection, vascular injury, stroke, or contrast induced renal failure.  This interventional procedure involves the use of X-rays and because of the nature of the planned procedure, it is possible that we will have prolonged use of X-ray fluoroscopy. Potential radiation risks to you include (but are not limited to) the following: - A slightly elevated risk for cancer  several years later in life. This risk is typically less than 0.5% percent. This risk is low in comparison to the normal incidence of human cancer, which is 33% for women and 50% for men according to the [employer]. - Radiation induced injury can include skin redness, resembling a rash, tissue breakdown / ulcers and hair loss (which can be temporary or permanent).  The likelihood of either of these occurring depends on the difficulty of the procedure and whether you are sensitive to radiation due to previous procedures, disease, or genetic conditions.   IF your procedure requires a prolonged use of radiation, you will be notified and given written instructions for further action.  It is your responsibility to monitor the irradiated area for the 2 weeks following the procedure and to notify your physician if you are concerned that you have suffered a radiation induced injury.    All of the patient's questions were answered, patient is agreeable to proceed.  Consent signed and in IR binder.  Thank you for this interesting consult.  I greatly enjoyed meeting [employer] and look forward to participating in their care.  A copy of this report was sent to the requesting provider on this date.  Electronically Signed: Joaquim Nam, PA-C 03/21/2019, 9:27 AM   I spent a total of 40 Minutes  in face to face in clinical consultation, greater than 50% of which was counseling/coordinating care for diagnostic cerebral angiogram.

## 2019-03-21 NOTE — Progress Notes (Signed)
PT Cancellation Note  Patient Details Name: Jeremy Sherman MRN: [id number] DOB: 1950/12/20   Cancelled Treatment:    Reason Eval/Treat Not Completed: Patient at procedure or test/unavailable (IR).  Ellamae Sia, PT, DPT Acute Rehabilitation Services Pager [phone] Office [phone]    Willy Eddy 03/21/2019, 10:34 AM

## 2019-03-21 NOTE — Progress Notes (Signed)
Duplex and ABIs noted. Pt common femoral and profunda open. Most likely this is all chronic disease. At some point may need agram but currently asymptomatic.   If he develops pain in the foot or numbness or wound will do agram while in hospital otherwise will wait until he has recovered from his neuro event.  Will recheck in morning.  Ruta Hinds MD

## 2019-03-21 NOTE — Consult Note (Signed)
Referring Physician: Dr Estanislado Pandy  Patient name: Jeremy Sherman MRN: [id number] DOB: Dec 23, 1950 Sex: male  REASON FOR CONSULT: Right foot ischemia  HPI: Jeremy Sherman is a 69 y.o. male, who underwent right common femoral access for a diagnostic carotid angiogram and cerebral angiogram earlier today by Dr. Estanislado Pandy.  According to the patient's medical record he had dorsalis pedis and posterior tibial Doppler signal in the right foot prior to the procedure.  At the conclusion of the procedure he did not have Doppler signals in the right foot.  The patient currently has no symptoms of pain numbness tingling or lack of motion in the right foot.  He does not have numbness and tingling in the foot at baseline.  He has no symptoms in the left foot either.  Patient was admitted with a recent stroke.  Other medical problems include coronary artery disease hyperlipidemia hypertension all of which have been stable.  Currently patient has minimal symptoms from his stroke.  Past Medical History:  Diagnosis Date  . CAD (coronary artery disease)   . Carotid stenosis, left    L ICA 50%  . Hyperlipidemia   . Hypertension   . Hypertensive urgency 03/11/2019  . 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 Prediabetes    Past Surgical History:  Procedure Laterality Date  . CORONARY ARTERY BYPASS GRAFT  07/02/2007   [place]-> LAD SVG-> PD branch of RCA [name]) at [facility]    Family History  Problem Relation Age of Onset  . Stroke Mother     SOCIAL HISTORY: Social History   Socioeconomic History  . Marital status: Married    Spouse name: Not on file  . Number of children: Not on file  . Years of education: Not on file  . Highest education level: Not on file  Occupational History  . Not on file  Tobacco Use  . Smoking status: Current Every Day Smoker    Packs/day: 0.50    Years: 45.00    Pack years: 22.50    Types: Cigarettes    Start date:  02/27/1974  . Smokeless tobacco: Never Used  Substance and Sexual Activity  . Alcohol use: Not Currently  . Drug use: Not on file  . Sexual activity: Not on file  Other Topics Concern  . Not on file  Social History Narrative  . Not on file   Social Determinants of Health   Financial Resource Strain:   . Difficulty of Paying Living Expenses: Not on file  Food Insecurity:   . Worried About Charity fundraiser in the Last Year: Not on file  . Ran Out of Food in the Last Year: Not on file  Transportation Needs:   . Lack of Transportation (Medical): Not on file  . Lack of Transportation (Non-Medical): Not on file  Physical Activity:   . Days of Exercise per Week: Not on file  . Minutes of Exercise per Session: Not on file  Stress:   . Feeling of Stress : Not on file  Social Connections:   . Frequency of Communication with Friends and Family: Not on file  . Frequency of Social Gatherings with Friends and Family: Not on file  . Attends Religious Services: Not on file  . Active Member of Clubs or Organizations: Not on file  . Attends Archivist Meetings: Not on file  . Marital Status: Not on file  Intimate Partner Violence:   . Fear  of Current or Ex-Partner: Not on file  . Emotionally Abused: Not on file  . Physically Abused: Not on file  . Sexually Abused: Not on file    No Known Allergies  Current Facility-Administered Medications  Medication Dose Route Frequency Provider Last Rate Last Admin  . 0.9 %  sodium chloride infusion   Intravenous Continuous Deveshwar, Sanjeev, MD      . acetaminophen (TYLENOL) tablet 650 mg  650 mg Oral Q4H PRN Jani Gravel, MD       Or  . acetaminophen (TYLENOL) 160 MG/5ML solution 650 mg  650 mg Per Tube Q4H PRN Jani Gravel, MD       Or  . acetaminophen (TYLENOL) suppository 650 mg  650 mg Rectal Q4H PRN Jani Gravel, MD      . atorvastatin (LIPITOR) tablet 80 mg  80 mg Oral q1800 Jani Gravel, MD      . clopidogrel (PLAVIX) tablet 75 mg  75  mg Oral Daily Jani Gravel, MD      . fentaNYL (SUBLIMAZE) 100 MCG/2ML injection           . heparin 1000 UNIT/ML injection           . heparin injection 5,000 Units  5,000 Units Subcutaneous Q8H Jani Gravel, MD   5,000 Units at 03/21/19 0700  . insulin aspart (novoLOG) injection 0-9 Units  0-9 Units Subcutaneous Q4H Jani Gravel, MD      . levothyroxine (SYNTHROID) tablet 100 mcg  100 mcg Oral Q0600 Jani Gravel, MD   100 mcg at 03/21/19 0700  . lidocaine (XYLOCAINE) 1 % (with pres) injection           . midazolam (VERSED) 2 MG/2ML injection           . nitroGLYCERIN 100 mcg/mL intra-arterial injection           . verapamil (ISOPTIN) 2.5 MG/ML injection             ROS:   General:  No weight loss, Fever, chills  HEENT: No recent headaches, no nasal bleeding, no visual changes, no sore throat  Neurologic: + dizziness, blackouts, seizures. + recent symptoms of stroke or mini- stroke. No recent episodes of slurred speech, or temporary blindness.  Cardiac: No recent episodes of chest pain/pressure, no shortness of breath at rest.  + shortness of breath with exertion.  Denies history of atrial fibrillation or irregular heartbeat  Vascular: No history of rest pain in feet.  No history of claudication.  No history of non-healing ulcer, No history of DVT   Pulmonary: No home oxygen, no productive cough, no hemoptysis,  No asthma or wheezing  Musculoskeletal:  [ ]  Arthritis, [ ]  Low back pain,  [ ]  Joint pain  Hematologic:No history of hypercoagulable state.  No history of easy bleeding.  No history of anemia  Gastrointestinal: No hematochezia or melena,  No gastroesophageal reflux, no trouble swallowing  Urinary: [ ]  chronic Kidney disease, [ ]  on HD - [ ]  MWF or [ ]  TTHS, [ ]  Burning with urination, [ ]  Frequent urination, [ ]  Difficulty urinating;   Skin: No rashes  Psychological: No history of anxiety,  No history of depression   Physical Examination  Vitals:   03/21/19 1220 03/21/19  1255 03/21/19 1305 03/21/19 1357  BP: (!) 137/99 132/74 137/76 139/88  Pulse: 61 62 62 (!) 58  Resp: 16 16 16 18   Temp:    98.4 F (36.9 C)  TempSrc:  Oral  SpO2: 97% 100% 97% 99%  Weight:      Height:        Body mass index is 27.74 kg/m.  General:  Alert and oriented, no acute distress HEENT: Normal Neck: No bruit or JVD Pulmonary: Clear to auscultation bilaterally Cardiac: Regular Rate and Rhythm  Abdomen: Soft, non-tender, non-distended Skin: No rash, thickened scaly skin bilateral feet with thickened nails no ulcer Extremity Pulses:  2+ left femoral patient tender in the right groin so this was not pressed, he has no palpable pulses in either feet.  Temperature of feet is fairly symmetric right foot may be slightly cooler than the left.   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  Otherwise we will wait on his diagnostic testing before proceeding with further intervention.  Please keep the patient n.p.o. for now.   Ruta Hinds, MD [facility] Office: [phone] Pager: [phone]

## 2019-03-21 NOTE — Sedation Documentation (Signed)
Unable to doppler distal right leg extremity pulses. Dr. Toni Amend made aware and at bedside to assess pulses. Patient denies pain in leg, foot warm to touch.

## 2019-03-21 NOTE — Progress Notes (Signed)
ABI and Arterial duplex have been completed.   Preliminary results in CV Proc.   Jeremy Sherman 03/21/2019 4:12 PM

## 2019-03-21 NOTE — Sedation Documentation (Signed)
Right groin sheath removed, manual pressure being held a right groin site.

## 2019-03-21 NOTE — Progress Notes (Signed)
OT Cancellation Note  Patient Details Name: Dominiq Nealy MRN: [id number] DOB: 03/18/1950   Cancelled Treatment:    Reason Eval/Treat Not Completed: Patient not medically ready Pt on bed rest orders post sheath removal. Will continue to follow as available and appropriate to initiate OT POC.   Marland KitchenZenovia Jarred, MSOT, OTR/L Acute Rehabilitation Services [facility] Office Number: [phone]  Zenovia Jarred 03/21/2019, 2:11 PM

## 2019-03-22 LAB — GLUCOSE, CAPILLARY
Glucose-Capillary: 104 mg/dL — ABNORMAL HIGH (ref 70–99)
Glucose-Capillary: 105 mg/dL — ABNORMAL HIGH (ref 70–99)
Glucose-Capillary: 111 mg/dL — ABNORMAL HIGH (ref 70–99)
Glucose-Capillary: 95 mg/dL (ref 70–99)
Glucose-Capillary: 97 mg/dL (ref 70–99)
Glucose-Capillary: 98 mg/dL (ref 70–99)

## 2019-03-22 LAB — T4, FREE: Free T4: 0.64 ng/dL (ref 0.61–1.12)

## 2019-03-22 LAB — CBC
HCT: 37.7 % — ABNORMAL LOW (ref 39.0–52.0)
Hemoglobin: 12.7 g/dL — ABNORMAL LOW (ref 13.0–17.0)
MCH: 31.6 pg (ref 26.0–34.0)
MCHC: 33.7 g/dL (ref 30.0–36.0)
MCV: 93.8 fL (ref 80.0–100.0)
Platelets: 199 10*3/uL (ref 150–400)
RBC: 4.02 MIL/uL — ABNORMAL LOW (ref 4.22–5.81)
RDW: 15 % (ref 11.5–15.5)
WBC: 5.2 10*3/uL (ref 4.0–10.5)
nRBC: 0 % (ref 0.0–0.2)

## 2019-03-22 LAB — COMPREHENSIVE METABOLIC PANEL
ALT: 15 U/L (ref 0–44)
AST: 22 U/L (ref 15–41)
Albumin: 3.5 g/dL (ref 3.5–5.0)
Alkaline Phosphatase: 64 U/L (ref 38–126)
Anion gap: 12 (ref 5–15)
BUN: 11 mg/dL (ref 8–23)
CO2: 23 mmol/L (ref 22–32)
Calcium: 9 mg/dL (ref 8.9–10.3)
Chloride: 101 mmol/L (ref 98–111)
Creatinine, Ser: 1.06 mg/dL (ref 0.61–1.24)
GFR calc Af Amer: >60 mL/min (ref >60)
GFR calc non Af Amer: >60 mL/min (ref >60)
Glucose, Bld: 109 mg/dL — ABNORMAL HIGH (ref 70–99)
Potassium: 3.3 mmol/L — ABNORMAL LOW (ref 3.5–5.1)
Sodium: 136 mmol/L (ref 135–145)
Total Bilirubin: 0.7 mg/dL (ref 0.3–1.2)
Total Protein: 7.6 g/dL (ref 6.5–8.1)

## 2019-03-22 LAB — HEPARIN LEVEL (UNFRACTIONATED)
Heparin Unfractionated: 0.34 IU/mL (ref 0.30–0.70)
Heparin Unfractionated: 0.89 IU/mL — ABNORMAL HIGH (ref 0.30–0.70)
Heparin Unfractionated: 0.85 IU/mL — ABNORMAL HIGH (ref 0.30–0.70)

## 2019-03-22 LAB — TSH: TSH: 45.808 u[IU]/mL — ABNORMAL HIGH (ref 0.350–4.500)

## 2019-03-22 MED ORDER — POTASSIUM CHLORIDE CRYS ER 20 MEQ PO TBCR
40.0000 meq | EXTENDED_RELEASE_TABLET | Freq: Once | ORAL | Status: AC
  Administered 2019-03-22: 40 meq via ORAL
  Filled 2019-03-22: qty 2

## 2019-03-22 NOTE — Consult Note (Signed)
No complaints in feet.    Physical Exam:  Vitals:   03/21/19 2000 03/21/19 2321 03/22/19 0354 03/22/19 0752  BP: (!) 136/55 (!) 124/57 (!) 150/92 (!) 146/69  Pulse: 65 67 79 65  Resp: 17 18 17 17   Temp: 99.5 F (37.5 C) 98.3 F (36.8 C) 99.3 F (37.4 C) 98.7 F (37.1 C)  TempSrc: Oral Oral Oral Oral  SpO2: 100% 99% 98% 99%  Weight:      Height:        Feet cool symmetric 2+ femoral pulses  Severe lengthening of thickened nails  A: Severe PAD currently asymptomatic, chronice  P: I will arrange outpt follow up for pt with me in a few weeks  Protect feet.  Will be at high risk of limb loss with any wound or pressure ulcer  He would definitely benefit from Podiatry eval to address his toenails which are all about 2 inches long and at risk of snagging and creating a wound  Call if questions  Ruta Hinds, MD [facility] Office: [phone]

## 2019-03-22 NOTE — Progress Notes (Signed)
[place] for Heparin Indication: Left Atrial Appendage Thrombus  No Known Allergies  Patient Measurements: Height: 6\' 5"  (195.6 cm) Weight: 233 lb 14.5 oz (106.1 kg) IBW/kg (Calculated) : 89.1 Heparin Dosing Weight: 106.1 kg   Vital Signs: Temp: 98.7 F (37.1 C) (01/23 0752) Temp Source: Oral (01/23 0752) BP: 146/69 (01/23 0752) Pulse Rate: 65 (01/23 0752)  Labs: Recent Labs    03/20/19 2008 03/21/19 0413 03/22/19 0112 03/22/19 0725  HGB  --  13.4 12.7*  --   HCT  --  39.4 37.7*  --   PLT  --  PLATELET CLUMPS NOTED ON SMEAR, UNABLE TO ESTIMATE 199  --   LABPROT  --  13.2  --   --   INR  --  1.0  --   --   HEPARINUNFRC  --   --  0.34 0.89*  CREATININE 1.14 1.12 1.06  --     Estimated Creatinine Clearance: 81.7 mL/min (by C-G formula based on SCr of 1.06 mg/dL).   Medical History: Past Medical History:  Diagnosis Date  . CAD (coronary artery disease)   . Carotid stenosis, left    L ICA 50%  . Hyperlipidemia   . Hypertension   . Hypertensive urgency 03/11/2019  . 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 Prediabetes     Assessment: Pharmacy is consulted to dose heparin in 69 yr old male with left atrial appendage thrombus shown on TEE from 03/20/19 at [facility]. 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 -Heparin level above goal  Goal of Therapy:  Heparin level: 0.3-0.5 units/ml Monitor platelets by anticoagulation protocol: Yes   Plan:  -Decrease heparin to 1100 units/hr -Heparin level in 6 hours and daily wth CBC daily  Hildred Laser, PharmD Clinical Pharmacist **Pharmacist phone directory can now be found on [email or web address] ([email or web address]).  Listed under [place].

## 2019-03-22 NOTE — Progress Notes (Signed)
ANTICOAGULATION CONSULT NOTE  Pharmacy Consult for Heparin Indication: Left Atrial Appendage Thrombus  No Known Allergies  Patient Measurements: Height: 6\' 5"  (195.6 cm) Weight: 233 lb 14.5 oz (106.1 kg) IBW/kg (Calculated) : 89.1 Heparin Dosing Weight: 106.1 kg   Vital Signs: Temp: 98.3 F (36.8 C) (01/23 1535) Temp Source: Oral (01/23 1535) BP: 116/66 (01/23 1535) Pulse Rate: 75 (01/23 1535)  Labs: Recent Labs    03/20/19 2008 03/21/19 0413 03/22/19 0112 03/22/19 0725 03/22/19 1631  HGB  --  13.4 12.7*  --   --   HCT  --  39.4 37.7*  --   --   PLT  --  PLATELET CLUMPS NOTED ON SMEAR, UNABLE TO ESTIMATE 199  --   --   LABPROT  --  13.2  --   --   --   INR  --  1.0  --   --   --   HEPARINUNFRC  --   --  0.34 0.89* 0.85*  CREATININE 1.14 1.12 1.06  --   --     Estimated Creatinine Clearance: 81.7 mL/min (by C-G formula based on SCr of 1.06 mg/dL).   Medical History: Past Medical History:  Diagnosis Date  . CAD (coronary artery disease)   . Carotid stenosis, left    L ICA 50%  . Hyperlipidemia   . Hypertension   . Hypertensive urgency 03/11/2019  . 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 Prediabetes     Assessment: Pharmacy is consulted to dose heparin in 69 yr old male with left atrial appendage thrombus shown on TEE from 03/20/19 at [facility]. MRI of brain on 03/17/19 showed old stroke. He is S/P 4-vessel cerebral arteriogram.   -Heparin level remains above goal despite prior rate decrease  Goal of Therapy:  Heparin level: 0.3-0.5 units/ml Monitor platelets by anticoagulation protocol: Yes   Plan:  -Decrease heparin to 900 units/hr -Heparin level in 6 hours and daily wth CBC daily -Monitor for bleeding  Vertis Kelch, PharmD, [facility] PGY2 Cardiology Pharmacy Resident Phone [phone] 03/22/2019       5:28 PM  Please check [email or web address] for unit-specific pharmacist phone numbers

## 2019-03-22 NOTE — Progress Notes (Signed)
[place] for Heparin Indication: Left Atrial Appendage Thrombus   Assessment: Pharmacy is consulted to dose heparin in 69 yr old male with left atrial appendage thrombus shown on TEE from 03/20/19 at [facility].  Heparin level in goal range 0.34 units/ml Goal of Therapy:  Heparin level: 0.3-0.5 units/ml Monitor platelets by anticoagulation protocol: Yes   Plan:  Continue heparin at 1300 units/hr Monitor 6-hr heparin level to confirm Monitor daily heparin level, CBC Monitor for signs/symptoms of bleeding  Thanks for allowing pharmacy to be a part of this patient's care.  Excell Seltzer, PharmD Clinical Pharmacist  03/22/2019,1:58 AM

## 2019-03-22 NOTE — Progress Notes (Signed)
[facility]  PROGRESS NOTE  Jeremy Sherman [id number] DOB: 06-May-1950 DOA: 03/20/2019 PCP: Imagene Riches, NP   Brief HPI:   69 year old male with a history of hypothyroidism, hypertension, hyperlipidemia, prediabetes, CAD s/p CABG 07/02/2007, smoker who was recently mated at [facility] from 03/11/2019 to 03/13/2019 for dizziness thought to be due to hypertensive urgency.  Apparently presented to [facility] ER on 03/17/2019 for dizziness and syncope at the PCP office.  Patient was unresponsive, hypotensive and diaphoretic.  In ED bp 158/77,   CT brain  03/17/2019 8x40mm focal slight dense lesion in the left and anterior callosal region.    MRI brain 03/17/19 Early subacute infarct within the right cerebellum and right brachium pontis  Punctate acute left parietal lobe cortical infarct  9x52mm lobular focus of enhancement in the pericallosal region w imaging features most suggestive of ACA pericallosal aneurysm, CTA recommended.     Chronic lacunar infarct in the right caudate nucleus  CTA head/ neck 4x7 mm anuerysm left pericallosal segment left ACA without evidence of rupture.   Mild stenosis of right M1 segment,  Moderate to severe stenosis left M1 segment poststenotic dilation.  Mild stenoss in the PCA bilaterally  Atherosclerotic disease, mural thrombus in the prox left ICA narrowing the lumen by 50%.  Eccentric atherosclerotic dsiease vs focal dissection in the left ICA below the skull base without significant stenosis  TEE 03/20/19 EF 45-50% , left atrial appendage thrombus, and mild MR   Subjective   Patient seen and examined, vascular surgery patient and plan for intervention as outpatient.  Appreciate neuro input.   Assessment/Plan:     1. M1 stenosis, left ACA aneurysm, mild L ICA stenosis-patient underwent cerebral arteriogram per neuro interventional neurology.  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  Plan for intervention early next week as per Dr. Estanislado Pandy.  Patient is currently on Plavix 75 mg p.o. daily.  Also started on aspirin 81 mg daily per neurology.  2. Left atrial appendage thrombus-patient has left atrial appendage thrombus, seen on TEE from 03/20/2019 at [facility] and discussed with cardiologist Dr. Sallyanne Kuster, he recommends starting anticoagulation immediately if okay with neuro intervention radiology.  I called and discussed with Dr. Estanislado Pandy, he is okay with starting anticoagulation.  Patient started on heparin.    3. Right foot ischemia-patient was found to have absent peripheral pulses in right foot, vascular surgery was consulted.  Dr. Eden Lathe has seen the patient and ordered duplex ultrasound of the right lower extremity with bilateral ABIs.  He has severe PAD, chronic.  Patient is asymptomatic at this time.  No intervention planned in the hospital.  Patient to follow-up as outpatient.  4. Bilateral stroke, right cerebellum, right brachium pontis-patient was taking aspirin 81 mg daily at home.  He has been started here on Plavix, and aspirin. Continue statin. Neurology following.  5. Syncope-multifactorial likely from polypharmacy causing bradycardia, hypotension, permissive hypertension.  Continue to monitor patient blood pressure in the hospital.  6. Bradycardia-likely from carvedilol which is on hold.  Patient also has hypothyroidism.  7. Hypothyroidism-TSH was significantly elevated 111 with T3 and T4 lower than normal.  On Synthroid 100 mcg daily.  Recheck TSH--45.8, T3 and T4--0.64.  8. Hypokalemia-potassium is 3.3 today.  Replace potassium and follow BMP in a.m.    SpO2: 98 % O2 Flow Rate (L/min): 2 L/min      CBG:  Recent Labs  Lab 03/21/19 2057 03/21/19 2319 03/22/19 0351 03/22/19 0754 03/22/19 1146  GLUCAP 93 108* 97 95 105*    CBC: Recent Labs  Lab 03/21/19 0413 03/22/19 0112   WBC 4.7 5.2  HGB 13.4 12.7*  HCT 39.4 37.7*  MCV 93.6 93.8  PLT PLATELET CLUMPS NOTED ON SMEAR, UNABLE TO ESTIMATE 123XX123    Basic Metabolic Panel: Recent Labs  Lab 03/20/19 2008 03/21/19 0413 03/22/19 0112  NA  --  136 136  K  --  3.2* 3.3*  CL  --  98 101  CO2  --  25 23  GLUCOSE  --  94 109*  BUN  --  15 11  CREATININE 1.14 1.12 1.06  CALCIUM  --  8.9 9.0     Liver Function Tests: Recent Labs  Lab 03/21/19 0413 03/22/19 0112  AST 23 22  ALT 16 15  ALKPHOS 61 64  BILITOT 0.9 0.7  PROT 7.4 7.6  ALBUMIN 3.5 3.5        DVT prophylaxis: Heparin  Code Status: Full code  Family Communication: No family at bedside  Disposition Plan: likely home when medically ready for discharge       Scheduled medications:  . aspirin EC  81 mg Oral Daily  . atorvastatin  80 mg Oral q1800  . clopidogrel  75 mg Oral Daily  . insulin aspart  0-9 Units Subcutaneous Q4H  . levothyroxine  100 mcg Oral Q0600    Consultants:  Neuro intervention radiology  Vascular surgery  Neurology  Procedures:  Cerebral arteriogram  Antibiotics:   Anti-infectives (From admission, onward)   None       Objective   Vitals:   03/21/19 2321 03/22/19 0354 03/22/19 0752 03/22/19 1143  BP: (!) 124/57 (!) 150/92 (!) 146/69 (!) 169/72  Pulse: 67 79 65 62  Resp: 18 17 17 20   Temp: 98.3 F (36.8 C) 99.3 F (37.4 C) 98.7 F (37.1 C) 98.2 F (36.8 C)  TempSrc: Oral Oral Oral Oral  SpO2: 99% 98% 99% 98%  Weight:      Height:        Intake/Output Summary (Last 24 hours) at 03/22/2019 1315 Last data filed at 03/22/2019 0500 Gross per 24 hour  Intake 314.85 ml  Output 425 ml  Net -110.15 ml    01/21 1901 - 01/23 0700 In: 626.8 [P.O.:240; I.V.:386.8] Out: 625 [Urine:625]  Filed Weights   03/20/19 1842  Weight: 106.1 kg    Physical Examination:    General-appears in no acute distress  Heart-S1-S2, regular, no murmur auscultated  Lungs-clear to auscultation  bilaterally, no wheezing or crackles auscultated  Abdomen-soft, nontender, no organomegaly  Extremities-no edema in the lower extremities  Neuro-alert, oriented x3, no focal deficit noted     Studies:  VAS [place] ABI WITH/WO TBI  Result Date: 03/22/2019 LOWER EXTREMITY DOPPLER STUDY Indications: Peripheral artery disease, and cold right foot post op angio. High Risk Factors: Hypertension, hyperlipidemia, coronary artery disease.  Comparison Study: no prior Performing Technologist: Abram Sander RVS  Examination Guidelines: A complete evaluation includes at minimum, Doppler waveform signals and systolic blood pressure reading at the level of bilateral brachial, anterior tibial, and posterior tibial arteries, when vessel segments are accessible. Bilateral testing is considered an integral part of a complete examination. Photoelectric Plethysmograph (PPG) waveforms and toe systolic pressure readings are included as required and additional duplex testing as needed. Limited examinations for reoccurring indications may be performed as noted.  ABI Findings: +--------+------------------+-----+---------+-----------+  Right   Rt Pressure (mmHg)IndexWaveform Comment     +--------+------------------+-----+---------+-----------+ [id number]                    triphasic            +--------+------------------+-----+---------+-----------+ PTA                                     not audible +--------+------------------+-----+---------+-----------+ PERO                                                +--------+------------------+-----+---------+-----------+ DP                                      not audible +--------+------------------+-----+---------+-----------+ +--------+------------------+-----+---------+-----------+ Left    Lt Pressure (mmHg)IndexWaveform Comment     +--------+------------------+-----+---------+-----------+ [id number]                    triphasic             +--------+------------------+-----+---------+-----------+ PTA                                     not audible +--------+------------------+-----+---------+-----------+ DP                                      not audible +--------+------------------+-----+---------+-----------+  Summary: Right: Unable to obtain DP and PTA. Not audbile. Right arterial duplex showed occluded SFA and popliteal artery. Left: Unable to obtain DP and PTA. Not audbile. Monophasic doppler flow noted in AT PT and peroneal right leg on duplex  *See table(s) above for measurements and observations.  Electronically signed by Ruta Hinds MD on 03/22/2019 at 9:39:19 AM.   Final    VAS [place] LOWER EXTREMITY ARTERIAL DUPLEX  Result Date: 03/22/2019 LOWER EXTREMITY ARTERIAL DUPLEX STUDY Indications: Peripheral artery disease.  Current ABI: n/a Limitations: right foot cool post angio Comparison Study: no prior Performing Technologist: Abram Sander RVS  Examination Guidelines: A complete evaluation includes B-mode imaging, spectral Doppler, color Doppler, and power Doppler as needed of all accessible portions of each vessel. Bilateral testing is considered an integral part of a complete examination. Limited examinations for reoccurring indications may be performed as noted.  +-----------+--------+-----+--------+----------+--------+ RIGHT      PSV cm/sRatioStenosisWaveform  Comments +-----------+--------+-----+--------+----------+--------+ CFA Prox   108                  biphasic           +-----------+--------+-----+--------+----------+--------+ DFA        47                   monophasic         +-----------+--------+-----+--------+----------+--------+ SFA Prox                occluded                   +-----------+--------+-----+--------+----------+--------+ SFA Mid                 occluded                   +-----------+--------+-----+--------+----------+--------+  SFA Distal              occluded                    +-----------+--------+-----+--------+----------+--------+ POP Prox                occluded                   +-----------+--------+-----+--------+----------+--------+ ATA Distal 9                    monophasic         +-----------+--------+-----+--------+----------+--------+ PTA Distal 16                   monophasic         +-----------+--------+-----+--------+----------+--------+ PERO Distal10                   monophasic         +-----------+--------+-----+--------+----------+--------+   Summary: Right: Total occlusion noted in the superficial femoral artery. Total occlusion noted in the superficial femoral artery and/or popliteal artery. Total occlusion noted in the popliteal artery.  See table(s) above for measurements and observations. Electronically signed by Ruta Hinds MD on 03/22/2019 at 9:38:33 AM.    Final      Admission status: Inpatient: Based on patients clinical presentation and evaluation of above clinical data, I have made determination that patient meets Inpatient criteria at this time.   Oswald Hillock   [facility] If 7PM-7AM, please contact night-coverage at [email or web address], Office  [phone]  password [email or web address]  03/22/2019, 1:15 PM  LOS: 2 days

## 2019-03-22 NOTE — Progress Notes (Signed)
STROKE TEAM PROGRESS NOTE   INTERVAL HISTORY Patient sitting in bed, having lunch, no acute distress, neuro stable.  Had cerebral angiogram with Dr. Estanislado Pandy yesterday, planning to have procedure to fix aneurysm and M1 stenosis.  Has vascular surgery consultation to evaluate lower extremity PAD.  OBJECTIVE Vitals:   03/21/19 2000 03/21/19 2321 03/22/19 0354 03/22/19 0752  BP: (!) 136/55 (!) 124/57 (!) 150/92 (!) 146/69  Pulse: 65 67 79 65  Resp: 17 18 17 17   Temp: 99.5 F (37.5 C) 98.3 F (36.8 C) 99.3 F (37.4 C) 98.7 F (37.1 C)  TempSrc: Oral Oral Oral Oral  SpO2: 100% 99% 98% 99%  Weight:      Height:        CBC:  Recent Labs  Lab 03/21/19 0413 03/22/19 0112  WBC 4.7 5.2  HGB 13.4 12.7*  HCT 39.4 37.7*  MCV 93.6 93.8  PLT PLATELET CLUMPS NOTED ON SMEAR, UNABLE TO ESTIMATE 123XX123    Basic Metabolic Panel:  Recent Labs  Lab 03/21/19 0413 03/22/19 0112  NA 136 136  K 3.2* 3.3*  CL 98 101  CO2 25 23  GLUCOSE 94 109*  BUN 15 11  CREATININE 1.12 1.06  CALCIUM 8.9 9.0    Lipid Panel:     Component Value Date/Time   CHOL 243 (H) 03/21/2019 0413   TRIG 147 03/21/2019 0413   HDL 37 (L) 03/21/2019 0413   CHOLHDL 6.6 03/21/2019 0413   VLDL 29 03/21/2019 0413   LDLCALC 177 (H) 03/21/2019 0413   HgbA1c:  Lab Results  Component Value Date   HGBA1C 6.4 (H) 03/21/2019   Urine Drug Screen: No results found for: LABOPIA, COCAINSCRNUR, LABBENZ, AMPHETMU, THCU, LABBARB  Alcohol Level No results found for: ETH  IMAGING  VAS [place] ABI WITH/WO TBI 03/22/2019 Summary:  Right: Unable to obtain DP and PTA. Not audbile. Right arterial duplex showed occluded SFA and popliteal artery.  Left: Unable to obtain DP and PTA. Not audbile. Monophasic doppler flow noted in AT PT and peroneal right leg on duplex    VAS [place] LOWER EXTREMITY ARTERIAL DUPLEX 03/22/2019 Summary:  Right: Total occlusion noted in the superficial femoral artery and/or popliteal artery. Total occlusion  noted in the popliteal artery.     S/P 4 Vessel Cerebral Arteriogram - Dr Estanislado Pandy 03/21/2019 RT CFA approach. Findings. 1. Severe 90% LT MCA M 1 stenosis. 2. Approx 9.7 mm x 7 mm LT ACA pericallosal aneurysm. 3. Approx 75% LT ICA prox stenosis. S.Deveshwar MD   Transthoracic Echocardiogram  Left atrial appendage thrombus-patient has left atrial appendage thrombus, seen on TEE from 03/20/2019 at [facility]    ECG - not performed here   PHYSICAL EXAM  Temp:  [98.2 F (36.8 C)-99.5 F (37.5 C)] 98.3 F (36.8 C) (01/23 1535) Pulse Rate:  [62-79] 75 (01/23 1535) Resp:  [17-20] 17 (01/23 1535) BP: (116-169)/(55-92) 116/66 (01/23 1535) SpO2:  [96 %-100 %] 96 % (01/23 1535)  General - Well nourished, well developed, in no apparent distress.  Ophthalmologic - fundi not visualized due to noncooperation.  Cardiovascular - Regular rhythm and rate.  Mental Status -  Level of arousal and orientation to time, place, and person were intact. Language including expression, naming, repetition, comprehension was assessed and found intact. Fund of Knowledge was assessed and was intact.  Cranial Nerves II - XII - II - Visual field intact OU. III, IV, VI - Extraocular movements intact. V - Facial sensation intact bilaterally. VII - Facial movement  intact bilaterally. VIII - Hearing & vestibular intact bilaterally. X - Palate elevates symmetrically. XI - Chin turning & shoulder shrug intact bilaterally. XII - Tongue protrusion intact.  Motor Strength - The patient's strength was normal in all extremities and pronator drift was absent.  Bulk was normal and fasciculations were absent.   Motor Tone - Muscle tone was assessed at the neck and appendages and was normal.  Reflexes - The patient's reflexes were symmetrical in all extremities and he had no pathological reflexes.  Sensory - Light touch, temperature/pinprick were assessed and were symmetrical.    Coordination - The  patient had normal movements in the hands with no ataxia or dysmetria.  Tremor was absent.  Gait and Station - deferred.   ASSESSMENT/PLAN Jeremy Sherman is a 69 y.o. 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  He subsequently became diaphoretic, hypotensive and unresponsive and was taken to [facility] where an MRI revealed a stroke. He did not receive IV t-PA due to late presentation.  Stroke:  early/ subacute infarct with in the right cerebellar peduncle, likely small vessel disease.   CT head - OSH - showed 8X37mm focal slightly dense lesion within the left and anterior callosal region   MRI head - OSH - anterior cerebral artery pericallosal aneurysm. Showed an early/ subacute infarct with in the right cerebellum in the right brachium pontis.   CTA H&N - OSH - 4 x 7 mm aneurysm left pericallosal segment left ACA without evidence of rupture. Moderate to severe stenosis left M1 segment with poststenotic dilatation. Atherosclerotic disease/mural thrombus in the proximal left ICA 50% stenosis.  IR - severe 90% left MCA M1 stenosis.  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  75% left ICA proximal stenosis.  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   LDL - 177  HgbA1c - 6.4  VTE prophylaxis - SCDs and Heparin IV  aspirin 81 mg daily prior to admission, now on clopidogrel 75 mg daily and heparin IV  Patient counseled to be compliant with his antithrombotic medications  Ongoing aggressive stroke risk factor management  Therapy recommendations:  pending  Disposition:  Pending  LAA thrombus  TEE - 03/20/2019 OSH - Left atrial appendage thrombus  on heparin IV  Tele monitoring to evaluate for afib  Cardiology on  board  Recommend long term cardiac monitoring with cardiology to evaluate for afib. If afib found pt will need long term anticoagulation  Left M1 high grade stenosis  CTA head and neck moderate to severe stenosis left M1 with poststenotic dilatation  IR severe left M1 90% stenosis  On Plavix  Dr. Estanislado Pandy plan for left M1 stenting next week  Left ACA pericallosal segment aneurysm  CTA head and neck 4x7  IR 9.7x7  Dr. Laurence Ferrari plan for pipeline stent for aneurysm repair next week  Left ICA proximal stenosis  CT head and neck left ICA proximal 50% stenosis with soft plaque versus mural thrombus  IR 75% left ICA proximal stenosis  On Plavix and heparin IV  PAD  Pre and post IR procedure, but her lower extremity PAD found with Doppler  Vascular surgery consulted  Dr. Oneida Alar to follow as outpatient  Hypertension  Home BP meds: none listed  Current BP meds: none  Stable . Permissive hypertension (OK if < 220/120) but gradually normalize in 5-7 days  . Long-term BP goal 130-150 given extra and intracranial stenosis  Hyperlipidemia  Home Lipid lowering medication: Lovaza  LDL 177, goal < 70  Current lipid lowering medication: Lipitor 80 mg daily   Continue statin at discharge  Diabetes  Home diabetic meds: none   Current diabetic meds: SSI   HgbA1c 6.4, goal < 7.0  CBG monitoring  Close PCP follow-up  Tobacco abuse  Current smoker  Smoking cessation counseling provided  Pt is willing to quit  Other Stroke Risk Factors  Previous ETOH use  Family hx stroke (mother)  Coronary artery disease  Other Active Problems  Hypokalemia - 3.3 - supplemented  Hospital day # 2  Neurology will sign off. Please call with questions. Pt will follow up with stroke clinic Dr. Leonie Man at [facility] in about 4 weeks. Thanks for the consult.  Rosalin Hawking, MD PhD Stroke Neurology 03/22/2019 5:22 PM  I spent  35 minutes in total face-to-face time with the  patient, more than 50% of which was spent in counseling and coordination of care, reviewing test results, images and medication, and discussing the diagnosis of stroke, left atrium appendage thrombus, left ACA aneurysm, left M1 and ICA high-grade stenosis, PAD, treatment plan and potential prognosis. This patient's care requiresreview of multiple databases, neurological assessment, discussion with family, other specialists and medical decision making of high complexity.  I also discussed with Dr. Darrick Meigs.    To contact Stroke Continuity provider, please refer to [email or web address] After hours, contact General Neurology

## 2019-03-22 NOTE — Evaluation (Signed)
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    Clinical Impression   PTA pt living with spouse, fully independent and still working full time. At time of eval he presents with higher level balance and cognitive deficits. He is able to complete transfers and functional mobility beyond a household distance at min guard level without AD. He does lean to the R slightly and is overall generally weak. He appears to have decreased awareness into deficits that limits safety going home. Educated on use of 3:1 as shower seat for tub shower at home to prevent falls. Recommend HHOT at this time to help promote functional and independent baseline. Will continue to follow per POC listed below.     Follow Up Recommendations  Home health OT;Supervision/Assistance - 24 hour    Equipment Recommendations  3 in 1 bedside commode    Recommendations for Other Services       Precautions / Restrictions Precautions Precautions: Fall Restrictions Weight Bearing Restrictions: No      Mobility Bed Mobility Overal bed mobility: Needs Assistance Bed Mobility: Supine to Sit     Supine to sit: Supervision     General bed mobility comments: finishing working with PT and up in recliner  Transfers Overall transfer level: Needs assistance Equipment used: None Transfers: Sit to/from Stand Sit to Stand: Min guard         General transfer comment: min-guard for safety    Balance Overall balance assessment: Needs assistance Sitting-balance support: Feet supported;No upper extremity supported Sitting balance-Leahy Scale: Good     Standing balance support: No upper extremity  supported Standing balance-Leahy Scale: Fair Standing balance comment: mild light lean and difficulty with higher level balance                           ADL either performed or assessed with clinical judgement   ADL Overall ADL's : Needs assistance/impaired Eating/Feeding: Independent   Grooming: Supervision/safety;Standing   Upper Body Bathing: Modified independent;Sitting   Lower Body Bathing: Min guard;Sit to/from stand   Upper Body Dressing : Modified independent;Sitting   Lower Body Dressing: Supervision/safety;Sit to/from stand   Toilet Transfer: Economist and Hygiene: Minimal assistance Toileting - Water quality scientist Details (indicate cue type and reason): unaware of incontinence during session Tub/ Shower Transfer: Min guard;3 in [employer] Details (indicate cue type and reason): education given on use of 3:1 in tub shower Functional mobility during ADLs: Min guard General ADL Comments: pt presents with higher level balance deficits and cognitive deficits as well as decreased BADL activity tolerance     Vision Patient Visual Report: No change from baseline Vision Assessment?: Yes Eye Alignment: Within Functional Limits Ocular Range of Motion: Within Functional Limits Alignment/Gaze Preference: Within Defined Limits Tracking/Visual Pursuits: Able to track stimulus in all quads without difficulty Additional Comments: no visual impairments noted     Perception     Praxis      Pertinent Vitals/Pain Pain Assessment: No/denies pain     Hand Dominance Right   Extremity/Trunk Assessment Upper Extremity Assessment Upper Extremity Assessment: Overall WFL for tasks assessed   Lower Extremity Assessment Lower Extremity Assessment: Defer to PT evaluation  Cervical / Trunk Assessment Cervical / Trunk Assessment: Normal   Communication Communication Communication: No  difficulties   Cognition Arousal/Alertness: Awake/alert Behavior During Therapy: WFL for tasks assessed/performed Overall Cognitive Status: Impaired/Different from baseline Area of Impairment: Safety/judgement;Problem solving                         Safety/Judgement: Decreased awareness of deficits;Decreased awareness of safety   Problem Solving: Difficulty sequencing;Requires verbal cues General Comments: decreased awareness of imbalance, incontinence in bed, and wrapped up in IV line unknowingly   General Comments  HR 86 bpm after ambulation    Exercises     Shoulder Instructions      Home Living Family/patient expects to be discharged to:: Private residence Living Arrangements: Spouse/significant other Available Help at Discharge: Family;Available 24 hours/day Type of Home: House Home Access: Stairs to enter [employer] of Steps: 4 Entrance Stairs-Rails: None Home Layout: One level     Bathroom Shower/Tub: Teacher, early years/pre: Standard     Home Equipment: [employer] - single point   Additional Comments: works full time as a Glass blower/designer      Prior Functioning/Environment Level of Independence: Independent        Comments: drives        OT Problem List: Decreased strength;Decreased knowledge of use of DME or AE;Decreased activity tolerance;Decreased cognition;Impaired balance (sitting and/or standing)      OT Treatment/Interventions: Self-care/ADL training;Therapeutic exercise;Patient/family education;Balance training;Energy conservation;Therapeutic activities;DME and/or AE instruction    OT Goals(Current goals can be found in the care plan section) Acute Rehab OT Goals Patient Stated Goal: return to home and work OT Goal Formulation: With patient Time For Goal Achievement: 04/05/19 Potential to Achieve Goals: Good  OT Frequency: Min 2X/week   Barriers to D/C:            Co-evaluation              AM-PAC OT  "6 Clicks" Daily Activity     Outcome Measure Help from another person eating meals?: None Help from another person taking care of personal grooming?: None Help from another person toileting, which includes using toliet, bedpan, or urinal?: A Little Help from another person bathing (including washing, rinsing, drying)?: A Little Help from another person to put on and taking off regular upper body clothing?: None Help from another person to put on and taking off regular lower body clothing?: A Little 6 Click Score: 21   End of Session Nurse Communication: Mobility status  Activity Tolerance: Patient tolerated treatment well Patient left: in chair;with call bell/phone within reach;with chair alarm set  OT Visit Diagnosis: Unsteadiness on feet (R26.81);Other abnormalities of gait and mobility (R26.89);Muscle weakness (generalized) (M62.81);Other symptoms and signs involving cognitive function                Time: [id number] OT Time Calculation (min): 10 min Charges:  OT General Charges $OT Visit: 1 Visit OT Evaluation $OT Eval Moderate Complexity: 1 Mod  Zenovia Jarred, MSOT, OTR/L Acute Rehabilitation Services [facility] Office Number: [phone]  Zenovia Jarred 03/22/2019, 5:46 PM

## 2019-03-22 NOTE — Progress Notes (Signed)
Chief Complaint: Patient was seen today for follow up cerebral angiogram  Supervising Physician: Luanne Bras  Patient Status: [facility] - In-pt  Subjective: S/p cerebral angiogram with findings as noted. Post procedure concern for absent pedal pulses. Appreciate vascular consult and input. Pt without c/o this am, feels well. Remains on heparin gtt  Objective: Physical Exam: BP (!) 146/69 (BP Location: Left Arm)   Pulse 65   Temp 98.7 F (37.1 C) (Oral)   Resp 17   Ht 6\' 5"  (1.956 m)   Wt 106.1 kg   SpO2 99%   BMI 27.74 kg/m  Neuro intact (R)groin soft, NT, no hematoma Femoral pulses intact bilat. Legs warm to ankles Feet cool but symmetric.   Current Facility-Administered Medications:  .  acetaminophen (TYLENOL) tablet 650 mg, 650 mg, Oral, Q4H PRN **OR** acetaminophen (TYLENOL) 160 MG/5ML solution 650 mg, 650 mg, Per Tube, Q4H PRN **OR** acetaminophen (TYLENOL) suppository 650 mg, 650 mg, Rectal, Q4H PRN, Jani Gravel, MD .  aspirin EC tablet 81 mg, 81 mg, Oral, Daily, Greta Doom, MD, 81 mg at 03/22/19 0914 .  atorvastatin (LIPITOR) tablet 80 mg, 80 mg, Oral, q1800, Jani Gravel, MD, 80 mg at 03/21/19 1842 .  clopidogrel (PLAVIX) tablet 75 mg, 75 mg, Oral, Daily, Jani Gravel, MD, 75 mg at 03/22/19 0914 .  heparin ADULT infusion 100 units/mL (25000 units/247mL sodium chloride 0.45%), 1,300 Units/hr, Intravenous, Continuous, Oswald Hillock, MD, Last Rate: 13 mL/hr at 03/21/19 2313, 1,300 Units/hr at 03/21/19 2313 .  insulin aspart (novoLOG) injection 0-9 Units, 0-9 Units, Subcutaneous, Q4H, Jani Gravel, MD .  levothyroxine (SYNTHROID) tablet 100 mcg, 100 mcg, Oral, Q0600, Jani Gravel, MD, 100 mcg at 03/22/19 0700  Labs: CBC Recent Labs    03/21/19 0413 03/22/19 0112  WBC 4.7 5.2  HGB 13.4 12.7*  HCT 39.4 37.7*  PLT PLATELET CLUMPS NOTED ON SMEAR, UNABLE TO ESTIMATE 199   BMET Recent Labs    03/21/19 0413 03/22/19 0112  NA 136 136  K 3.2* 3.3*  CL  98 101  CO2 25 23  GLUCOSE 94 109*  BUN 15 11  CREATININE 1.12 1.06  CALCIUM 8.9 9.0   LFT Recent Labs    03/22/19 0112  PROT 7.6  ALBUMIN 3.5  AST 22  ALT 15  ALKPHOS 64  BILITOT 0.7   PT/INR Recent Labs    03/21/19 0413  LABPROT 13.2  INR 1.0     Studies/Results: VAS [place] ABI WITH/WO TBI  Result Date: 03/21/2019 LOWER EXTREMITY DOPPLER STUDY Indications: Peripheral artery disease, and cold right foot post op angio. High Risk Factors: Hypertension, hyperlipidemia, coronary artery disease.  Comparison Study: no prior Performing Technologist: Abram Sander RVS  Examination Guidelines: A complete evaluation includes at minimum, Doppler waveform signals and systolic blood pressure reading at the level of bilateral brachial, anterior tibial, and posterior tibial arteries, when vessel segments are accessible. Bilateral testing is considered an integral part of a complete examination. Photoelectric Plethysmograph (PPG) waveforms and toe systolic pressure readings are included as required and additional duplex testing as needed. Limited examinations for reoccurring indications may be performed as noted.  ABI Findings: +--------+------------------+-----+---------+-----------+ Right   Rt Pressure (mmHg)IndexWaveform Comment     +--------+------------------+-----+---------+-----------+ [id number]                    triphasic            +--------+------------------+-----+---------+-----------+ PTA  not audible +--------+------------------+-----+---------+-----------+ PERO                                                +--------+------------------+-----+---------+-----------+ DP                                      not audible +--------+------------------+-----+---------+-----------+ +--------+------------------+-----+---------+-----------+ Left    Lt Pressure (mmHg)IndexWaveform Comment      +--------+------------------+-----+---------+-----------+ [id number]                    triphasic            +--------+------------------+-----+---------+-----------+ PTA                                     not audible +--------+------------------+-----+---------+-----------+ DP                                      not audible +--------+------------------+-----+---------+-----------+  Summary: Right: Unable to obtain DP and PTA. Not audbile. Right arterial duplex showed occluded SFA and popliteal artery. Left: Unable to obtain DP and PTA. Not audbile.  *See table(s) above for measurements and observations.    Preliminary    VAS [place] LOWER EXTREMITY ARTERIAL DUPLEX  Result Date: 03/21/2019 LOWER EXTREMITY ARTERIAL DUPLEX STUDY Indications: Peripheral artery disease.  Current ABI: n/a Limitations: right foot cool post angio Comparison Study: no prior Performing Technologist: Abram Sander RVS  Examination Guidelines: A complete evaluation includes B-mode imaging, spectral Doppler, color Doppler, and power Doppler as needed of all accessible portions of each vessel. Bilateral testing is considered an integral part of a complete examination. Limited examinations for reoccurring indications may be performed as noted.  +-----------+--------+-----+--------+----------+--------+ RIGHT      PSV cm/sRatioStenosisWaveform  Comments +-----------+--------+-----+--------+----------+--------+ CFA Prox   108                  biphasic           +-----------+--------+-----+--------+----------+--------+ DFA        47                   monophasic         +-----------+--------+-----+--------+----------+--------+ SFA Prox                occluded                   +-----------+--------+-----+--------+----------+--------+ SFA Mid                 occluded                   +-----------+--------+-----+--------+----------+--------+ SFA Distal              occluded                    +-----------+--------+-----+--------+----------+--------+ POP Prox                occluded                   +-----------+--------+-----+--------+----------+--------+ ATA Distal 9  monophasic         +-----------+--------+-----+--------+----------+--------+ PTA Distal 16                   monophasic         +-----------+--------+-----+--------+----------+--------+ PERO Distal10                   monophasic         +-----------+--------+-----+--------+----------+--------+   Summary: Right: Total occlusion noted in the superficial femoral artery. Total occlusion noted in the superficial femoral artery and/or popliteal artery. Total occlusion noted in the popliteal artery.  See table(s) above for measurements and observations.    Preliminary     Assessment/Plan: S/p cerebral arteriogram with findings as below 1.severe 90% LT MCA M 1 stenosis. 2.Approx 9.7 mm x 7 mm LT ACA pericallosal aneurysm. 3. Approx 75% LT ICA prox stenosis.  Severe PAD, appreciate VVS input, no immediate intervention needed NIR will cont to follow   LOS: 2 days   I spent a total of 15 minutes in face to face in clinical consultation, greater than 50% of which was counseling/coordinating care for cerebral angio  Ascencion Dike PA-C 03/22/2019 9:20 AM

## 2019-03-22 NOTE — Evaluation (Signed)
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   Clinical Impression  Pt admitted with above diagnosis. Pt ambulated 240' with IV pole and no AD. Unsteady without support, min A needed, though has not really ambulated past week. Decreased awareness of deficits noted as well. Will follow acutely and rec HHPT for now.  Pt currently with functional limitations due to the deficits listed below (see PT Problem List). Pt will benefit from skilled PT to increase their independence and safety with mobility to allow discharge to the venue listed below.       Follow Up Recommendations Home health PT    Equipment Recommendations  None recommended by PT    Recommendations for Other Services       Precautions / Restrictions Precautions Precautions: Fall Restrictions Weight Bearing Restrictions: No      Mobility  Bed Mobility Overal bed mobility: Needs Assistance Bed Mobility: Supine to Sit     Supine to sit: Supervision     General bed mobility comments: increased time and effort but no physical assist needed   Transfers Overall transfer level: Needs assistance Equipment used: None Transfers: Sit to/from Stand Sit to Stand: Min guard         General transfer comment: min-guard for safety  Ambulation/Gait Ambulation/Gait assistance: Min assist Gait Distance (Feet): 240 Feet Assistive device: None;IV Pole Gait Pattern/deviations: Wide base of support Gait velocity: WFL Gait velocity interpretation: >2.62 ft/sec, indicative of community ambulatory General Gait Details: with IV pole, pt ambulated min-guard A, mild unsteadiness noted. With no support, pt was  unsteady and widened BOS, min A for safety.   Stairs Stairs: Yes Stairs assistance: Min assist Stair Management: One rail Left;Forwards;Alternating pattern Number of Stairs: 5 General stair comments: safe with use of rails  Wheelchair Mobility    Modified Rankin (Stroke Patients Only) Modified Rankin (Stroke Patients Only) Pre-Morbid Rankin Score: No symptoms Modified Rankin: Moderate disability     Balance Overall balance assessment: Needs assistance Sitting-balance support: Feet supported;No upper extremity supported Sitting balance-Leahy Scale: Good     Standing balance support: No upper extremity supported Standing balance-Leahy Scale: Fair                               Pertinent Vitals/Pain Pain Assessment: No/denies pain    Home Living Family/patient expects to be discharged to:: Private residence Living Arrangements: Spouse/significant other Available Help at Discharge: Family;Available 24 hours/day Type of Home: House Home Access: Stairs to enter Entrance Stairs-Rails: None Entrance Stairs-Number of Steps: 4 Home Layout: One level Home Equipment: Cane - single point Additional Comments: works full time as a Glass blower/designer    Prior Function Level of Independence: Independent         Comments: drives     Hand Dominance   Dominant Hand: Right    Extremity/Trunk Assessment   Upper Extremity Assessment Upper Extremity Assessment: Defer to OT evaluation    Lower Extremity Assessment Lower Extremity Assessment: Overall WFL for tasks assessed    Cervical / Trunk Assessment Cervical / Trunk Assessment: Normal  Communication   Communication: Expressive difficulties(mild slurring)  Cognition Arousal/Alertness: Awake/alert Behavior During Therapy: WFL for tasks assessed/performed Overall Cognitive Status: Impaired/Different  from baseline Area of Impairment: Safety/judgement;Problem solving                          Safety/Judgement: Decreased awareness of deficits;Decreased awareness of safety   Problem Solving: Difficulty sequencing;Requires verbal cues General Comments: decreased awareness of imbalance, incontinence in bed, and wrapped up in IV line unknowingly      General Comments General comments (skin integrity, edema, etc.): HR 86 bpm after ambulation    Exercises     Assessment/Plan    PT Assessment Patient needs continued PT services  PT Problem List Decreased balance;Decreased mobility;Decreased knowledge of use of DME;Decreased safety awareness;Decreased knowledge of precautions       PT Treatment Interventions DME instruction;Gait training;Stair training;Functional mobility training;Therapeutic activities;Therapeutic exercise;Balance training;Cognitive remediation;Patient/family education    PT Goals (Current goals can be found in the Care Plan section)  Acute Rehab PT Goals Patient Stated Goal: return to home and work PT Goal Formulation: With patient Time For Goal Achievement: 04/05/19 Potential to Achieve Goals: Good    Frequency Min 4X/week   Barriers to discharge        Co-evaluation               AM-PAC PT "6 Clicks" Mobility  Outcome Measure Help needed turning from your back to your side while in a flat bed without using bedrails?: None Help needed moving from lying on your back to sitting on the side of a flat bed without using bedrails?: A Little Help needed moving to and from a bed to a chair (including a wheelchair)?: A Little Help needed standing up from a chair using your arms (e.g., wheelchair or bedside chair)?: A Little Help needed to walk in hospital room?: A Little Help needed climbing 3-5 steps with a railing? : A Little 6 Click Score: 19    End of Session Equipment Utilized During Treatment: Gait belt Activity Tolerance: Patient tolerated treatment well Patient left: in chair;with chair alarm set;with call bell/phone within reach Nurse  Communication: Mobility status PT Visit Diagnosis: Other abnormalities of gait and mobility (R26.89);Unsteadiness on feet (R26.81)    Time: 1405-1440 PT Time Calculation (min) (ACUTE ONLY): 35 min   Charges:   PT Evaluation $PT Eval Moderate Complexity: 1 Mod PT Treatments $Gait Training: 8-22 mins        Leighton Roach, [place]  Pager [phone] Office [place] 03/22/2019, 3:01 PM

## 2019-03-23 ENCOUNTER — Inpatient Hospital Stay (HOSPITAL_COMMUNITY): Payer: BC Managed Care – PPO

## 2019-03-23 LAB — CBC
HCT: 37.5 % — ABNORMAL LOW (ref 39.0–52.0)
Hemoglobin: 12.7 g/dL — ABNORMAL LOW (ref 13.0–17.0)
MCH: 31.7 pg (ref 26.0–34.0)
MCHC: 33.9 g/dL (ref 30.0–36.0)
MCV: 93.5 fL (ref 80.0–100.0)
Platelets: 193 10*3/uL (ref 150–400)
RBC: 4.01 MIL/uL — ABNORMAL LOW (ref 4.22–5.81)
RDW: 14.9 % (ref 11.5–15.5)
WBC: 5.7 10*3/uL (ref 4.0–10.5)
nRBC: 0 % (ref 0.0–0.2)

## 2019-03-23 LAB — BASIC METABOLIC PANEL
Anion gap: 11 (ref 5–15)
BUN: 15 mg/dL (ref 8–23)
CO2: 22 mmol/L (ref 22–32)
Calcium: 8.9 mg/dL (ref 8.9–10.3)
Chloride: 102 mmol/L (ref 98–111)
Creatinine, Ser: 1.43 mg/dL — ABNORMAL HIGH (ref 0.61–1.24)
GFR calc Af Amer: 57 mL/min — ABNORMAL LOW (ref >60)
GFR calc non Af Amer: 49 mL/min — ABNORMAL LOW (ref >60)
Glucose, Bld: 108 mg/dL — ABNORMAL HIGH (ref 70–99)
Potassium: 3.6 mmol/L (ref 3.5–5.1)
Sodium: 135 mmol/L (ref 135–145)

## 2019-03-23 LAB — GLUCOSE, CAPILLARY
Glucose-Capillary: 109 mg/dL — ABNORMAL HIGH (ref 70–99)
Glucose-Capillary: 92 mg/dL (ref 70–99)
Glucose-Capillary: 99 mg/dL (ref 70–99)
Glucose-Capillary: 103 mg/dL — ABNORMAL HIGH (ref 70–99)
Glucose-Capillary: 106 mg/dL — ABNORMAL HIGH (ref 70–99)
Glucose-Capillary: 111 mg/dL — ABNORMAL HIGH (ref 70–99)

## 2019-03-23 LAB — HEPARIN LEVEL (UNFRACTIONATED)
Heparin Unfractionated: 0.44 IU/mL (ref 0.30–0.70)
Heparin Unfractionated: 0.73 IU/mL — ABNORMAL HIGH (ref 0.30–0.70)

## 2019-03-23 LAB — T3: T3, Total: 64 ng/dL — ABNORMAL LOW (ref 71–180)

## 2019-03-23 MED ORDER — SODIUM CHLORIDE 0.9 % IV SOLN: INTRAVENOUS | Status: DC

## 2019-03-23 MED ORDER — TAMSULOSIN HCL 0.4 MG PO CAPS
0.4000 mg | ORAL_CAPSULE | Freq: Every day | ORAL | Status: DC
  Administered 2019-03-23 – 2019-03-25 (×3): 0.4 mg via ORAL
  Filled 2019-03-23 (×3): qty 1

## 2019-03-23 NOTE — Progress Notes (Signed)
ANTICOAGULATION CONSULT NOTE - Follow Up Consult  Pharmacy Consult for heparin Indication: left atrial appendage thrombus  Labs: Recent Labs    03/20/19 2008 03/21/19 0413 03/21/19 0413 03/22/19 0112 03/22/19 0112 03/22/19 0725 03/22/19 1631 03/23/19 0003  HGB  --  13.4   < > 12.7*  --   --   --  12.7*  HCT  --  39.4  --  37.7*  --   --   --  37.5*  PLT  --  PLATELET CLUMPS NOTED ON SMEAR, UNABLE TO ESTIMATE  --  199  --   --   --  193  LABPROT  --  13.2  --   --   --   --   --   --   INR  --  1.0  --   --   --   --   --   --   HEPARINUNFRC  --   --   --  0.34   < > 0.89* 0.85* 0.73*  CREATININE 1.14 1.12  --  1.06  --   --   --   --    < > = values in this interval not displayed.    Assessment: 69yo male remains supratherapeutic on heparin after rate changes; no gtt issues or signs of bleeding per RN.  Goal of Therapy:  Heparin level 0.3-0.5 units/ml   Plan:  Will decrease heparin gtt by 3 units/kg/hr to 600 units/hr and check level in 6-8 hours.    Wynona Neat, PharmD, BCPS  03/23/2019,12:49 AM

## 2019-03-23 NOTE — Progress Notes (Addendum)
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  Apparently presented to [facility] ER on 03/17/2019 for dizziness and syncope at the PCP office.  Patient was unresponsive, hypotensive and diaphoretic.  In ED bp 158/77,   CT brain  03/17/2019 8x37mm focal slight dense lesion in the left and anterior callosal region.    MRI brain 03/17/19 Early subacute infarct within the right cerebellum and right brachium pontis  Punctate acute left parietal lobe cortical infarct  9x29mm lobular focus of enhancement in the pericallosal region w imaging features most suggestive of ACA pericallosal aneurysm, CTA recommended.     Chronic lacunar infarct in the right caudate nucleus  CTA head/ neck 4x7 mm anuerysm left pericallosal segment left ACA without evidence of rupture.   Mild stenosis of right M1 segment,  Moderate to severe stenosis left M1 segment poststenotic dilation.  Mild stenoss in the PCA bilaterally  Atherosclerotic disease, mural thrombus in the prox left ICA narrowing the lumen by 50%.  Eccentric atherosclerotic dsiease vs focal dissection in the left ICA below the skull base without significant stenosis  TEE 03/20/19 EF 45-50% , left atrial appendage thrombus, and mild MR   Subjective   Patient seen and examined, denies any complaints.   Assessment/Plan:     1. M1 stenosis, left ACA aneurysm, mild L ICA stenosis-patient underwent cerebral arteriogram per neuro interventional neurology.  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  Plan for intervention  early next week as per Dr. Estanislado Pandy.  Patient is currently on Plavix 75 mg p.o. daily.  Also started on aspirin 81 mg daily per neurology.  2. 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 Sallyanne Kuster, he recommends starting anticoagulation immediately if okay with neuro intervention radiology.  I called and discussed with Dr. Estanislado Pandy, he is okay with starting anticoagulation.  Patient started on heparin.    3. Right foot ischemia-patient was found to have absent peripheral pulses in right foot, vascular surgery was consulted.  Dr. Eden Lathe has seen the patient and ordered duplex ultrasound of the right lower extremity with bilateral ABIs.  He has severe PAD, chronic.  Patient is asymptomatic at this time.  No intervention planned in the hospital.  Patient to follow-up as outpatient.  4. Bilateral stroke, right cerebellum, right brachium pontis-patient was taking aspirin 81 mg daily at home.  He has been started here on Plavix, and aspirin. Continue statin. Neurology following.  5. Syncope-multifactorial likely from polypharmacy causing bradycardia, hypotension, permissive hypertension.  Continue to monitor patient blood pressure in the hospital.  6. Bradycardia-likely from carvedilol which is on hold.  Patient also has hypothyroidism.  7. Hypothyroidism-TSH was significantly elevated 111 with T3 and T4 lower than normal.  On Synthroid 100 mcg daily.  Recheck TSH--45.8, T3 and T4--0.64.  8. Hypokalemia-replete  9. Abdominal mass-palpable mass noted in left lower quadrant/lower abdomen.  X-ray of the abdomen showed distended urinary bladder.  Will obtain bladder scan and in and out cath as needed.  Will start him on Flomax 0.4  mg p.o. daily.  10. Acute kidney injury-patient baseline creatinine is around one.  Creatinine has jumped up to 1.43.  Started on Flomax as above.  Start  gentle hydration with normal saline at 75 mill per hour.  Follow BMP in a.m.    SpO2: 99 % O2 Flow Rate (L/min): 2 L/min      CBG: Recent Labs  Lab 03/22/19 1538 03/22/19 1927 03/22/19 2353 03/23/19 0335 03/23/19 0749  GLUCAP 111* 98 104* 109* 92    CBC: Recent Labs  Lab 03/21/19 0413 03/22/19 0112 03/23/19 0003  WBC 4.7 5.2 5.7  HGB 13.4 12.7* 12.7*  HCT 39.4 37.7* 37.5*  MCV 93.6 93.8 93.5  PLT PLATELET CLUMPS NOTED ON SMEAR, UNABLE TO ESTIMATE 199 0000000    Basic Metabolic Panel: Recent Labs  Lab 03/20/19 2008 03/21/19 0413 03/22/19 0112 03/23/19 0003  NA  --  136 136 135  K  --  3.2* 3.3* 3.6  CL  --  98 101 102  CO2  --  25 23 22   GLUCOSE  --  94 109* 108*  BUN  --  15 11 15   CREATININE 1.14 1.12 1.06 1.43*  CALCIUM  --  8.9 9.0 8.9     Liver Function Tests: Recent Labs  Lab 03/21/19 0413 03/22/19 0112  AST 23 22  ALT 16 15  ALKPHOS 61 64  BILITOT 0.9 0.7  PROT 7.4 7.6  ALBUMIN 3.5 3.5        DVT prophylaxis: Heparin  Code Status: Full code  Family Communication: No family at bedside  Disposition Plan: likely home when medically ready for discharge       Scheduled medications:  . atorvastatin  80 mg Oral q1800  . clopidogrel  75 mg Oral Daily  . insulin aspart  0-9 Units Subcutaneous Q4H  . levothyroxine  100 mcg Oral Q0600    Consultants:  Neuro intervention radiology  Vascular surgery  Neurology  Procedures:  Cerebral arteriogram  Antibiotics:   Anti-infectives (From admission, onward)   None       Objective   Vitals:   03/22/19 2000 03/22/19 2356 03/23/19 0338 03/23/19 0754  BP: 136/71 115/78 118/62 (!) 121/55  Pulse: 90 75 79 71  Resp: 17 18 16 17   Temp: 98.3 F (36.8 C) 100.2 F (37.9 C) 98.9 F (37.2 C) 98.7 F (37.1 C)  TempSrc: Oral Oral Oral Oral  SpO2: 97% 98% 96% 99%  Weight:      Height:        Intake/Output Summary (Last 24 hours) at 03/23/2019 1215 Last data filed at 03/22/2019  2000 Gross per 24 hour  Intake 122.48 ml  Output 300 ml  Net -177.52 ml    01/22 1901 - 01/24 0700 In: 197.3 [I.V.:197.3] Out: 525 [Urine:525]  Filed Weights   03/20/19 1842  Weight: 106.1 kg    Physical Examination:    General-appears in no acute distress  Heart-S1-S2, regular, no murmur auscultated  Lungs-clear to auscultation bilaterally, no wheezing or crackles auscultated  Abdomen-soft, palpable mass noted in the left lower quadrant/lower abdomen  Extremities-no edema in the lower extremities  Neuro-alert, oriented x3, no focal deficit noted      Studies:  DG Abd 2 Views  Result Date: 03/23/2019 CLINICAL DATA:  Patient status post stroke.  Abdominal distension. EXAM: ABDOMEN - 2 VIEW COMPARISON:  None. FINDINGS: The bowel gas pattern is normal. There is no evidence of free air. No radio-opaque calculi or other significant radiographic abnormality  is seen. Contrast in a distended urinary bladder from the patient's angiogram is noted. IMPRESSION: Normal bowel gas pattern. Distended urinary bladder. Electronically Signed   By: Inge Rise M.D.   On: 03/23/2019 11:24   VAS [place] ABI WITH/WO TBI  Result Date: 03/22/2019 LOWER EXTREMITY DOPPLER STUDY Indications: Peripheral artery disease, and cold right foot post op angio. High Risk Factors: Hypertension, hyperlipidemia, coronary artery disease.  Comparison Study: no prior Performing Technologist: Abram Sander RVS  Examination Guidelines: A complete evaluation includes at minimum, Doppler waveform signals and systolic blood pressure reading at the level of bilateral brachial, anterior tibial, and posterior tibial arteries, when vessel segments are accessible. Bilateral testing is considered an integral part of a complete examination. Photoelectric Plethysmograph (PPG) waveforms and toe systolic pressure readings are included as required and additional duplex testing as needed. Limited examinations for reoccurring  indications may be performed as noted.  ABI Findings: +--------+------------------+-----+---------+-----------+ Right   Rt Pressure (mmHg)IndexWaveform Comment     +--------+------------------+-----+---------+-----------+ [id number]                    triphasic            +--------+------------------+-----+---------+-----------+ PTA                                     not audible +--------+------------------+-----+---------+-----------+ PERO                                                +--------+------------------+-----+---------+-----------+ DP                                      not audible +--------+------------------+-----+---------+-----------+ +--------+------------------+-----+---------+-----------+ Left    Lt Pressure (mmHg)IndexWaveform Comment     +--------+------------------+-----+---------+-----------+ [id number]                    triphasic            +--------+------------------+-----+---------+-----------+ PTA                                     not audible +--------+------------------+-----+---------+-----------+ DP                                      not audible +--------+------------------+-----+---------+-----------+  Summary: Right: Unable to obtain DP and PTA. Not audbile. Right arterial duplex showed occluded SFA and popliteal artery. Left: Unable to obtain DP and PTA. Not audbile. Monophasic doppler flow noted in AT PT and peroneal right leg on duplex  *See table(s) above for measurements and observations.  Electronically signed by Ruta Hinds MD on 03/22/2019 at 9:39:19 AM.   Final    VAS [place] LOWER EXTREMITY ARTERIAL DUPLEX  Result Date: 03/22/2019 LOWER EXTREMITY ARTERIAL DUPLEX STUDY Indications: Peripheral artery disease.  Current ABI: n/a Limitations: right foot cool post angio Comparison Study: no prior Performing Technologist: Abram Sander RVS  Examination Guidelines: A complete evaluation includes B-mode imaging, spectral  Doppler, color Doppler, and power Doppler as needed of all accessible portions of each vessel. Bilateral testing is considered an integral  part of a complete examination. Limited examinations for reoccurring indications may be performed as noted.  +-----------+--------+-----+--------+----------+--------+ RIGHT      PSV cm/sRatioStenosisWaveform  Comments +-----------+--------+-----+--------+----------+--------+ CFA Prox   108                  biphasic           +-----------+--------+-----+--------+----------+--------+ DFA        47                   monophasic         +-----------+--------+-----+--------+----------+--------+ SFA Prox                occluded                   +-----------+--------+-----+--------+----------+--------+ SFA Mid                 occluded                   +-----------+--------+-----+--------+----------+--------+ SFA Distal              occluded                   +-----------+--------+-----+--------+----------+--------+ POP Prox                occluded                   +-----------+--------+-----+--------+----------+--------+ ATA Distal 9                    monophasic         +-----------+--------+-----+--------+----------+--------+ PTA Distal 16                   monophasic         +-----------+--------+-----+--------+----------+--------+ PERO Distal10                   monophasic         +-----------+--------+-----+--------+----------+--------+   Summary: Right: Total occlusion noted in the superficial femoral artery. Total occlusion noted in the superficial femoral artery and/or popliteal artery. Total occlusion noted in the popliteal artery.  See table(s) above for measurements and observations. Electronically signed by Ruta Hinds MD on 03/22/2019 at 9:38:33 AM.    Final      Admission status: Inpatient: Based on patients clinical presentation and evaluation of above clinical data, I have made determination  that patient meets Inpatient criteria at this time.   Oswald Hillock   [facility] If 7PM-7AM, please contact night-coverage at [email or web address], Office  [phone]  password [email or web address]  03/23/2019, 12:15 PM  LOS: 3 days

## 2019-03-23 NOTE — Progress Notes (Signed)
PT Cancellation Note  Patient Details Name: Jeremy Sherman MRN: [id number] DOB: Sep 15, 1950   Cancelled Treatment:    Reason Eval/Treat Not Completed: Fatigue/lethargy limiting ability to participate. Pt napping and declined PT services today. Will check back as schedule permits.   Galen Manila 03/23/2019, 4:31 PM

## 2019-03-23 NOTE — Progress Notes (Signed)
ANTICOAGULATION CONSULT NOTE  Pharmacy Consult for Heparin Indication: Left Atrial Appendage Thrombus  No Known Allergies  Patient Measurements: Height: 6\' 5"  (195.6 cm) Weight: 233 lb 14.5 oz (106.1 kg) IBW/kg (Calculated) : 89.1 Heparin Dosing Weight: 106.1 kg   Vital Signs: Temp: 98.7 F (37.1 C) (01/24 0754) Temp Source: Oral (01/24 0754) BP: 121/55 (01/24 0754) Pulse Rate: 71 (01/24 0754)  Labs: Recent Labs    03/21/19 0413 03/21/19 0413 03/22/19 0112 03/22/19 0725 03/22/19 1631 03/23/19 0003 03/23/19 0738  HGB 13.4   < > 12.7*  --   --  12.7*  --   HCT 39.4  --  37.7*  --   --  37.5*  --   PLT PLATELET CLUMPS NOTED ON SMEAR, UNABLE TO ESTIMATE  --  199  --   --  193  --   LABPROT 13.2  --   --   --   --   --   --   INR 1.0  --   --   --   --   --   --   HEPARINUNFRC  --   --  0.34   < > 0.85* 0.73* 0.44  CREATININE 1.12  --  1.06  --   --  1.43*  --    < > = values in this interval not displayed.    Estimated Creatinine Clearance: 60.6 mL/min (A) (by C-G formula based on SCr of 1.43 mg/dL (H)).   Medical History: Past Medical History:  Diagnosis Date  . CAD (coronary artery disease)   . Carotid stenosis, left    L ICA 50%  . Hyperlipidemia   . Hypertension   . Hypertensive urgency 03/11/2019  . 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 Prediabetes     Assessment: Pharmacy is consulted to dose heparin in 69 yr old male with left atrial appendage thrombus shown on TEE from 03/20/19 at [facility] 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 -Heparin level at goal -plans noted this week for procedure to fix aneurysm and M1 stenosis  Goal of Therapy:  Heparin level: 0.3-0.5 units/ml Monitor platelets by anticoagulation protocol: Yes   Plan:  -No heparin changes needed -Daily heparin level and CBC  Hildred Laser, PharmD Clinical Pharmacist **Pharmacist phone directory can now be found on [email or web address] (PW  [email or web address]).  Listed under [place].

## 2019-03-24 ENCOUNTER — Inpatient Hospital Stay (HOSPITAL_COMMUNITY): Payer: BC Managed Care – PPO

## 2019-03-24 DIAGNOSIS — F17200 Nicotine dependence, unspecified, uncomplicated: Secondary | ICD-10-CM

## 2019-03-24 DIAGNOSIS — E782 Mixed hyperlipidemia: Secondary | ICD-10-CM

## 2019-03-24 DIAGNOSIS — I739 Peripheral vascular disease, unspecified: Secondary | ICD-10-CM

## 2019-03-24 LAB — GLUCOSE, CAPILLARY
Glucose-Capillary: 111 mg/dL — ABNORMAL HIGH (ref 70–99)
Glucose-Capillary: 102 mg/dL — ABNORMAL HIGH (ref 70–99)
Glucose-Capillary: 124 mg/dL — ABNORMAL HIGH (ref 70–99)
Glucose-Capillary: 135 mg/dL — ABNORMAL HIGH (ref 70–99)
Glucose-Capillary: 120 mg/dL — ABNORMAL HIGH (ref 70–99)

## 2019-03-24 LAB — BASIC METABOLIC PANEL
Anion gap: 12 (ref 5–15)
BUN: 18 mg/dL (ref 8–23)
CO2: 22 mmol/L (ref 22–32)
Calcium: 8.5 mg/dL — ABNORMAL LOW (ref 8.9–10.3)
Chloride: 102 mmol/L (ref 98–111)
Creatinine, Ser: 1.52 mg/dL — ABNORMAL HIGH (ref 0.61–1.24)
GFR calc Af Amer: 53 mL/min — ABNORMAL LOW (ref >60)
GFR calc non Af Amer: 46 mL/min — ABNORMAL LOW (ref >60)
Glucose, Bld: 109 mg/dL — ABNORMAL HIGH (ref 70–99)
Potassium: 3.8 mmol/L (ref 3.5–5.1)
Sodium: 136 mmol/L (ref 135–145)

## 2019-03-24 LAB — CBC
HCT: 33.2 % — ABNORMAL LOW (ref 39.0–52.0)
Hemoglobin: 11.4 g/dL — ABNORMAL LOW (ref 13.0–17.0)
MCH: 31.8 pg (ref 26.0–34.0)
MCHC: 34.3 g/dL (ref 30.0–36.0)
MCV: 92.7 fL (ref 80.0–100.0)
Platelets: 194 10*3/uL (ref 150–400)
RBC: 3.58 MIL/uL — ABNORMAL LOW (ref 4.22–5.81)
RDW: 15 % (ref 11.5–15.5)
WBC: 4.5 10*3/uL (ref 4.0–10.5)
nRBC: 0 % (ref 0.0–0.2)

## 2019-03-24 LAB — HEPARIN LEVEL (UNFRACTIONATED)
Heparin Unfractionated: 0.15 IU/mL — ABNORMAL LOW (ref 0.30–0.70)
Heparin Unfractionated: 0.14 IU/mL — ABNORMAL LOW (ref 0.30–0.70)

## 2019-03-24 LAB — PLATELET INHIBITION P2Y12: Platelet Function  P2Y12: 160 [PRU] — ABNORMAL LOW (ref 182–335)

## 2019-03-24 MED ORDER — CHLORHEXIDINE GLUCONATE CLOTH 2 % EX PADS
6.0000 | MEDICATED_PAD | Freq: Every day | CUTANEOUS | Status: DC
  Administered 2019-03-24 – 2019-04-03 (×10): 6 via TOPICAL

## 2019-03-24 MED ORDER — LIDOCAINE HCL URETHRAL/MUCOSAL 2 % EX GEL
1.0000 "application " | Freq: Once | CUTANEOUS | Status: AC
  Administered 2019-03-24: 1 via URETHRAL
  Filled 2019-03-24: qty 20

## 2019-03-24 MED ORDER — ASPIRIN EC 81 MG PO TBEC
81.0000 mg | DELAYED_RELEASE_TABLET | Freq: Every day | ORAL | Status: DC
  Administered 2019-03-24 – 2019-03-25 (×2): 81 mg via ORAL
  Filled 2019-03-24 (×2): qty 1

## 2019-03-24 NOTE — Progress Notes (Addendum)
Referring Physician(s): Jani Gravel  Supervising Physician: Luanne Bras  Patient Status:  [facility] - In-pt  Chief Complaint: None  Subjective:  Acute CVA s/p diagnostic cerebral arteriogram revealing severe left MCA M1 segment stenosis, a left ACA pericallosal aneurysm, and proximal left ICA stenosis. Patient awake and alert sitting in bed eating breakfast, no complaints.   Allergies: Patient has no known allergies.  Medications: Prior to Admission medications   Medication Sig Start Date End Date Taking? Authorizing Provider  aspirin EC 81 MG tablet Take 81 mg by mouth daily.   Yes [provider]  omega-3 acid ethyl esters (LOVAZA) 1 g capsule Take 1 g by mouth daily.   Yes [provider]     Vital Signs: BP 138/73 (BP Location: Left Arm)   Pulse 68   Temp 99.3 F (37.4 C) (Axillary)   Resp 16   Ht 6\' 5"  (1.956 m)   Wt 233 lb 14.5 oz (106.1 kg)   SpO2 98%   BMI 27.74 kg/m   Physical Exam Vitals and nursing note reviewed.  Constitutional:      General: He is not in acute distress.    Appearance: Normal appearance.  Pulmonary:     Effort: Pulmonary effort is normal. No respiratory distress.  Skin:    General: Skin is warm and dry.     Comments: Right groin incision soft without active bleeding or hematoma.  Neurological:     Mental Status: He is alert and oriented to person, place, and time.  Psychiatric:        Mood and Affect: Mood normal.        Behavior: Behavior normal.     Imaging: DG Abd 2 Views  Result Date: 03/23/2019 CLINICAL DATA:  Patient status post stroke.  Abdominal distension. EXAM: ABDOMEN - 2 VIEW COMPARISON:  None. FINDINGS: The bowel gas pattern is normal. There is no evidence of free air. No radio-opaque calculi or other significant radiographic abnormality is seen. Contrast in a distended urinary bladder from the patient's angiogram is noted. IMPRESSION: Normal bowel gas pattern. Distended urinary bladder.  Electronically Signed   By: Inge Rise M.D.   On: 03/23/2019 11:24   VAS [place] ABI WITH/WO TBI  Result Date: 03/22/2019 LOWER EXTREMITY DOPPLER STUDY Indications: Peripheral artery disease, and cold right foot post op angio. High Risk Factors: Hypertension, hyperlipidemia, coronary artery disease.  Comparison Study: no prior Performing Technologist: Abram Sander RVS  Examination Guidelines: A complete evaluation includes at minimum, Doppler waveform signals and systolic blood pressure reading at the level of bilateral brachial, anterior tibial, and posterior tibial arteries, when vessel segments are accessible. Bilateral testing is considered an integral part of a complete examination. Photoelectric Plethysmograph (PPG) waveforms and toe systolic pressure readings are included as required and additional duplex testing as needed. Limited examinations for reoccurring indications may be performed as noted.  ABI Findings: +--------+------------------+-----+---------+-----------+ Right   Rt Pressure (mmHg)IndexWaveform Comment     +--------+------------------+-----+---------+-----------+ [id number]                    triphasic            +--------+------------------+-----+---------+-----------+ PTA                                     not audible +--------+------------------+-----+---------+-----------+ PERO                                                +--------+------------------+-----+---------+-----------+  DP                                      not audible +--------+------------------+-----+---------+-----------+ +--------+------------------+-----+---------+-----------+ Left    Lt Pressure (mmHg)IndexWaveform Comment     +--------+------------------+-----+---------+-----------+ [id number]                    triphasic            +--------+------------------+-----+---------+-----------+ PTA                                     not audible  +--------+------------------+-----+---------+-----------+ DP                                      not audible +--------+------------------+-----+---------+-----------+  Summary: Right: Unable to obtain DP and PTA. Not audbile. Right arterial duplex showed occluded SFA and popliteal artery. Left: Unable to obtain DP and PTA. Not audbile. Monophasic doppler flow noted in AT PT and peroneal right leg on duplex  *See table(s) above for measurements and observations.  Electronically signed by Ruta Hinds MD on 03/22/2019 at 9:39:19 AM.   Final    VAS [place] LOWER EXTREMITY ARTERIAL DUPLEX  Result Date: 03/22/2019 LOWER EXTREMITY ARTERIAL DUPLEX STUDY Indications: Peripheral artery disease.  Current ABI: n/a Limitations: right foot cool post angio Comparison Study: no prior Performing Technologist: Abram Sander RVS  Examination Guidelines: A complete evaluation includes B-mode imaging, spectral Doppler, color Doppler, and power Doppler as needed of all accessible portions of each vessel. Bilateral testing is considered an integral part of a complete examination. Limited examinations for reoccurring indications may be performed as noted.  +-----------+--------+-----+--------+----------+--------+ RIGHT      PSV cm/sRatioStenosisWaveform  Comments +-----------+--------+-----+--------+----------+--------+ CFA Prox   108                  biphasic           +-----------+--------+-----+--------+----------+--------+ DFA        47                   monophasic         +-----------+--------+-----+--------+----------+--------+ SFA Prox                occluded                   +-----------+--------+-----+--------+----------+--------+ SFA Mid                 occluded                   +-----------+--------+-----+--------+----------+--------+ SFA Distal              occluded                   +-----------+--------+-----+--------+----------+--------+ POP Prox                occluded                    +-----------+--------+-----+--------+----------+--------+ ATA Distal 9                    monophasic         +-----------+--------+-----+--------+----------+--------+ PTA Distal 16  monophasic         +-----------+--------+-----+--------+----------+--------+ PERO Distal10                   monophasic         +-----------+--------+-----+--------+----------+--------+   Summary: Right: Total occlusion noted in the superficial femoral artery. Total occlusion noted in the superficial femoral artery and/or popliteal artery. Total occlusion noted in the popliteal artery.  See table(s) above for measurements and observations. Electronically signed by Ruta Hinds MD on 03/22/2019 at 9:38:33 AM.    Final     Labs:  CBC: Recent Labs    03/21/19 0413 03/22/19 0112 03/23/19 0003 03/24/19 0238  WBC 4.7 5.2 5.7 4.5  HGB 13.4 12.7* 12.7* 11.4*  HCT 39.4 37.7* 37.5* 33.2*  PLT PLATELET CLUMPS NOTED ON SMEAR, UNABLE TO ESTIMATE 199 193 194    COAGS: Recent Labs    03/21/19 0413  INR 1.0    BMP: Recent Labs    03/21/19 0413 03/22/19 0112 03/23/19 0003 03/24/19 0238  NA 136 136 135 136  K 3.2* 3.3* 3.6 3.8  CL 98 101 102 102  CO2 25 23 22 22   GLUCOSE 94 109* 108* 109*  BUN 15 11 15 18   CALCIUM 8.9 9.0 8.9 8.5*  CREATININE 1.12 1.06 1.43* 1.52*  GFRNONAA >60 >60 49* 46*  GFRAA >60 >60 57* 53*    LIVER FUNCTION TESTS: Recent Labs    03/21/19 0413 03/22/19 0112  BILITOT 0.9 0.7  AST 23 22  ALT 16 15  ALKPHOS 37 64  PROT 7.4 7.6  ALBUMIN 3.5 3.5    Assessment and Plan:  Acute CVA s/p diagnostic cerebral arteriogram revealing severe left MCA M1 segment stenosis, a left ACA pericallosal aneurysm, and proximal left ICA stenosis. Discussed management of above with patient. Explained that there are two management options moving forward- either conservative management including routine imaging scans to monitor for changes or  with an endovascular procedure. Explained procedure, including risks and benefits. Explained that due to patient's severe PAD, recommend treating left MCA stenosis and left ACA aneurysm during same procedure (if patient agrees to proceed). Patient expresses desire to move forward with procedure. Dr. Estanislado Pandy discussed treatment plans with Dr. Oneida Alar ([facility]) who recommends direct carotid stick for access during intervention- appreciate assistance with this. Plan for image-guided cerebral arteriogram with possible revascularization (angioplasty/stent placement) of left MCA M1 stenosis and/or embolization of left ACA pericallosal aneurysm via direct carotid stick access (by [facility]) tentatively for 03/26/2019 with Dr. Estanislado Pandy. Continue taking Plavix 75 mg once daily, begin taking Aspirin 81 mg once daily. Will obtain P2Y12 today to check effectiveness of DAPT. From NIR standpoint, ok to proceed with IV Heparin. Additional orders to follow. Will obtain consent prior to procedure. NIR to follow.   Electronically Signed: Earley Abide, PA-C 03/24/2019, 11:31 AM   I spent a total of 25 Minutes at the the patient's bedside AND on the patient's hospital floor or unit, greater than 50% of which was counseling/coordinating care for left MCA M1 stenosis, left ACA pericallosal aneurysm, and proximal left ICA stenosis.

## 2019-03-24 NOTE — Evaluation (Signed)
Speech Language Pathology Evaluation Patient Details Name: Jeremy Sherman MRN: [id number] DOB: 05-Jul-1950 Today's Date: 03/24/2019 Time: [id number] SLP Time Calculation (min) (ACUTE ONLY): 22 min  Problem List:  Patient Active Problem List   Diagnosis Date Noted  . Carotid stenosis 03/21/2019  . CAD (coronary artery disease) 03/21/2019  . Brain aneurysm 03/21/2019  . Stroke [facility]) 03/20/2019   Past Medical History:  Past Medical History:  Diagnosis Date  . CAD (coronary artery disease)   . Carotid stenosis, left    L ICA 50%  . Hyperlipidemia   . Hypertension   . Hypertensive urgency 03/11/2019  . 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 Prediabetes    Past Surgical History:  Past Surgical History:  Procedure Laterality Date  . CORONARY ARTERY BYPASS GRAFT  07/02/2007   [place]-> LAD SVG-> PD branch of RCA [name]) at [facility]   HPI:  Jeremy Sherman  is a 69 y.o. 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  Imaging revealed subacute infarct within the right cerebellum and right brachium pontis and punctate acute left parietal lobe cortical infarct.   Assessment / Plan / Recommendation Clinical Impression  Pt presents with mild-moderate cognitive impairments.  Pt was assessed using the COGNISTAT (see below for additional information).  Areas of impairment include memory and executive funciton (calculations, similarities).  Pt also exhibited some difficulty with following sequential commands as well, but this does not appear due to a true comprehension deficit and is more likely the result of fatigue and decreased attention.  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  Pt lives with his retired wife who is responsible for meal preparation, management of finances, and can assist him with any medication management as needed.  SLP will follow for above mentioned deficits while in house.  Pt may benefit from continued ST at next level of care.   COGNISTAT - all subtests are within the average range, except where otherwise specified Orientation: 11/12 Attention: 8/8 Comprehension: 4/6, mild impairment Repetition: 10/12 Naming: 6/8 Construction: not assessed Memory: 4/12, Severe impairment Calculation: 2/4, mild impairment Similarities: 1/8, severe impairment Judgment: 4/6    SLP Assessment  SLP Recommendation/Assessment: Patient needs continued Speech Lanaguage Pathology Services SLP Visit Diagnosis: Cognitive communication deficit (R41.841)    Follow Up Recommendations  (Continue ST at next level of care)    Frequency and Duration min 2x/week  2 weeks      SLP Evaluation Cognition  Overall Cognitive Status: Impaired/Different from baseline Arousal/Alertness: Awake/alert Orientation Level: Oriented X4 Attention: Focused;Sustained Focused Attention: Appears intact Sustained Attention: Appears intact Memory: Impaired Memory Impairment: Decreased short term memory Decreased Short Term Memory: Verbal basic Problem Solving: Impaired Problem Solving Impairment: Verbal complex Executive Function: Reasoning Reasoning: Impaired Reasoning Impairment: Verbal complex       Comprehension  Auditory Comprehension Commands: Impaired Multistep Basic Commands: 50-74% accurate Visual Recognition/Discrimination Discrimination: Not tested Reading Comprehension Reading Status: Not tested    Expression Expression Primary Mode of Expression: Verbal Verbal  Expression Overall Verbal Expression: Appears within functional limits for tasks  assessed Level of Generative/Spontaneous Verbalization: Conversation Repetition: No impairment Naming: No impairment Pragmatics: No impairment   Oral / Surveyor, quantity Overall Motor Speech: Appears within functional limits for tasks assessed Respiration: Within functional limits Resonance: Within functional limits Articulation: Within functional limitis Intelligibility: Intelligible Motor Speech Errors: Not applicable   [place], [place], [place] Office: [phone] 03/24/2019, 12:40 PM

## 2019-03-24 NOTE — Progress Notes (Signed)
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 s/p arteriogram which additionally reveals L ACA pericallosal aneurysm   OT comments  Pt fatigued this afternoon with OT.  He requires min A for ADLs and is unsteady placing him at risk for falls.   He initially insists he is safe to return to work, but had a good discussion with him re: post stroke fatigue, and that I don't think he has the endurance to safely return to work at this time.  He stated he agreed with that recommendation. He will need direct supervision/assist at discharge, at least initially as his balance decreases as he fatigues which places him at risk for falls.  Recommend tub transfer bench, and recommend no driving until cleared by therapies at follow up.    Follow Up Recommendations  Home health OT;Supervision/Assistance - 24 hour    Equipment Recommendations  Tub/shower bench    Recommendations for Other Services      Precautions / Restrictions Precautions Precautions: Fall Restrictions Weight Bearing Restrictions: No       Mobility Bed Mobility Overal bed mobility: Needs Assistance Bed Mobility: Supine to Sit;Sit to Supine     Supine to sit: HOB elevated;Min guard Sit to supine: Min guard;HOB elevated   General bed mobility comments: dependent on use of bedrails   Transfers Overall transfer level: Needs assistance Equipment used: None(pushing IV pole per his request ) Transfers: Sit to/from [facility] Sit to Stand: Min assist Stand pivot transfers: Min assist       General transfer comment: assist for balance     Balance Overall balance assessment: Needs  assistance Sitting-balance support: Feet supported;No upper extremity supported Sitting balance-Leahy Scale: Good     Standing balance support: During functional activity;Single extremity supported Standing balance-Leahy Scale: Poor Standing balance comment: reliant on UE support                            ADL either performed or assessed with clinical judgement   ADL Overall ADL's : Needs assistance/impaired Eating/Feeding: Independent   Grooming: Wash/dry hands;Oral care;Wash/dry face;Brushing hair;Min guard;Standing   Upper Body Bathing: Set up;Sitting   Lower Body Bathing: Minimal assistance;Sit to/from stand   Upper Body Dressing : Set up;Sitting   Lower Body Dressing: Minimal assistance;Sit to/from stand   Toilet Transfer: Minimal assistance;Ambulation;Comfort height toilet;Grab bars   Toileting- Clothing Manipulation and Hygiene: Minimal assistance;Sit to/from stand       Functional mobility during ADLs: Minimal assistance General ADL Comments: Pt reports feeling fatigued and doesn't feel steady this afternoon.  He requires min A for balance during ADLs.  Discussed recommendation for tub transfer bench with pt      Vision       Perception     Praxis      Cognition Arousal/Alertness: Awake/alert Behavior During Therapy: WFL for tasks assessed/performed Overall Cognitive Status: Impaired/Different from baseline Area of Impairment: Safety/judgement;Problem solving;Awareness                         Safety/Judgement: Decreased awareness of deficits;Decreased awareness of safety Awareness: Intellectual Problem Solving: Difficulty sequencing;Requires verbal cues General Comments: pt unable to generalize current deficits and  how they may impact him functionally         Exercises Other Exercises Other Exercises: Sit to stand x5 from EOB with use of RW, emphasis on slow eccentric lowering and gluteal contraction and full upright standing.    Shoulder Instructions       General Comments Pt initially stating he feels he can return to work.  At end of session discussed his level of fatigue and balance deficits and that I don't think he is ready to return to work, and should take off several weeks at minimum.  He did agree.  Discussed post stroke fatigue with pt and recommendation for tub transfer bench     Pertinent Vitals/ Pain       Pain Assessment: No/denies pain Faces Pain Scale: No hurt Pain Intervention(s): RN gave pain meds during session  Home Living     Available Help at Discharge: Family(Wife retired)                                    Prior Functioning/Environment              Frequency  Min 2X/week        Progress Toward Goals  OT Goals(current goals can now be found in the care plan section)  Progress towards OT goals: Not progressing toward goals - comment(fatigue )  Acute Rehab OT Goals Patient Stated Goal: return to home and work  Plan Discharge plan remains appropriate;Equipment recommendations need to be updated    Co-evaluation                 AM-PAC OT "6 Clicks" Daily Activity     Outcome Measure   Help from another person eating meals?: None Help from another person taking care of personal grooming?: A Little Help from another person toileting, which includes using toliet, bedpan, or urinal?: A Little Help from another person bathing (including washing, rinsing, drying)?: A Little Help from another person to put on and taking off regular upper body clothing?: A Little Help from another person to put on and taking off regular lower body clothing?: A Little 6 Click Score: 19    End of Session    OT Visit Diagnosis: Unsteadiness on feet (R26.81);Other abnormalities of gait and mobility (R26.89);Muscle weakness (generalized) (M62.81);Other symptoms and signs involving cognitive function   Activity Tolerance     Patient Left     Nurse Communication           Time: [id number] OT Time Calculation (min): 20 min  Charges: OT General Charges $OT Visit: 1 Visit OT Treatments $Self Care/Home Management : 8-22 mins  Nilsa Nutting OTR/L Acute Rehabilitation Services Pager [phone] Office [phone]    Lucille Passy M 03/24/2019, 1:21 PM

## 2019-03-24 NOTE — Progress Notes (Signed)
[place] for Heparin Indication: Left Atrial Appendage Thrombus  No Known Allergies  Patient Measurements: Height: 6\' 5"  (195.6 cm) Weight: 233 lb 14.5 oz (106.1 kg) IBW/kg (Calculated) : 89.1 Heparin Dosing Weight: 106.1 kg   Vital Signs: Temp: 99.3 F (37.4 C) (01/25 0753) Temp Source: Axillary (01/25 0753) BP: 138/73 (01/25 0753) Pulse Rate: 68 (01/25 0753)  Labs: Recent Labs    03/22/19 0112 03/22/19 0725 03/23/19 0003 03/23/19 0738 03/24/19 0238  HGB 12.7*  --  12.7*  --  11.4*  HCT 37.7*  --  37.5*  --  33.2*  PLT 199  --  193  --  194  HEPARINUNFRC 0.34   < > 0.73* 0.44 0.15*  CREATININE 1.06  --  1.43*  --  1.52*   < > = values in this interval not displayed.    Estimated Creatinine Clearance: 57 mL/min (A) (by C-G formula based on SCr of 1.52 mg/dL (H)).   Assessment: Pharmacy is consulted to dose heparin in 69 yr old male with left atrial appendage thrombus shown on TEE from 03/20/[facility] at [facility]. 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 -Heparin level below goal -plans noted this week for procedure to fix aneurysm and M1 stenosis  Goal of Therapy:  Heparin level: 0.3-0.5 units/ml Monitor platelets by anticoagulation protocol: Yes   Plan:  -Increase heparin to 750 units / hr -8 hour heparin level -Daily heparin level and CBC  Anette Guarneri, PharmD Clinical Pharmacist **Pharmacist phone directory can now be found on [email or web address] ([email or web address]).  Listed under [place].

## 2019-03-24 NOTE — Progress Notes (Signed)
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 s/p arteriogram which additionally reveals L ACA pericallosal aneurysm    PT Comments    PT focused session on normalizing gait, increasing pt activity tolerance for ambulation, and functional transfer training. Pt tolerated session well and showed increased ambulation distance this session vs eval, but does require frequent rest breaks at this time due to decreased activity tolerance. PT to continue to progress pt mobility as able, will continue to follow acutely.    Follow Up Recommendations  Home health PT     Equipment Recommendations  None recommended by PT    Recommendations for Other Services       Precautions / Restrictions Precautions Precautions: Fall Restrictions Weight Bearing Restrictions: No    Mobility  Bed Mobility Overal bed mobility: Needs Assistance Bed Mobility: Supine to Sit;Sit to Supine     Supine to sit: Min guard Sit to supine: Min assist   General bed mobility comments: min guard for supine to sit for safety, increased time. Min assist for return to supine for LE lifting into bed.  Transfers Overall transfer level: Needs assistance Equipment used: None;Rolling walker (2 wheeled) Transfers: Sit to/from Stand Sit to Stand: Min assist         General transfer comment: min assist for steadying upon standing, verbal cuing for hand placement rising especially with repeated sit to stands with use of RW.  Ambulation/Gait Ambulation/Gait assistance: Min assist Gait Distance (Feet): 340 Feet Assistive device: IV Pole;1 person hand held assist Gait Pattern/deviations: Wide base of  support;Step-through pattern;Staggering right Gait velocity: slightly decr, heavy anterior leaning to speed up gait   General Gait Details: Min assist for occasional steadying from PT, wide BOS with unsteadiness and RLE min buckling noted corrected by PT and use of IV pole and PT HHA on R. Standing rest breaks x2, one at 200 ft and other at 275 ft. Pt staggering R during hallway navigation, requiring PT assist to correct.   Stairs             Wheelchair Mobility    Modified Rankin (Stroke Patients Only)       Balance Overall balance assessment: Needs assistance Sitting-balance support: Feet supported;No upper extremity supported Sitting balance-Leahy Scale: Good     Standing balance support: No upper extremity supported Standing balance-Leahy Scale: Fair                              Cognition Arousal/Alertness: Awake/alert Behavior During Therapy: WFL for tasks assessed/performed Overall Cognitive Status: Impaired/Different from baseline Area of Impairment: Safety/judgement;Problem solving;Awareness                         Safety/Judgement: Decreased awareness of deficits;Decreased awareness of safety Awareness: Intellectual Problem Solving: Difficulty sequencing;Requires verbal cues General Comments: pt with difficulty articulating reason for being hospitalized, all pt states is "I'm going to have some sort of procedure sometime". Pt lacking understanding of deficits, staggers R requiring PT correction in hallway.      Exercises Other Exercises Other Exercises: Sit to stand x5 from EOB with use of RW, emphasis on slow eccentric lowering and gluteal contraction and full  upright standing.    General Comments General comments (skin integrity, edema, etc.): HRmax 103 bpm during ambulation      Pertinent Vitals/Pain Pain Assessment: No/denies pain    Home Living                      Prior Function            PT Goals (current  goals can now be found in the care plan section) Acute Rehab PT Goals Patient Stated Goal: return to home and work PT Goal Formulation: With patient Time For Goal Achievement: 04/05/19 Potential to Achieve Goals: Good Progress towards PT goals: Progressing toward goals    Frequency    Min 4X/week      PT Plan Current plan remains appropriate    Co-evaluation              AM-PAC PT "6 Clicks" Mobility   Outcome Measure  Help needed turning from your back to your side while in a flat bed without using bedrails?: None Help needed moving from lying on your back to sitting on the side of a flat bed without using bedrails?: A Little Help needed moving to and from a bed to a chair (including a wheelchair)?: A Little Help needed standing up from a chair using your arms (e.g., wheelchair or bedside chair)?: A Little Help needed to walk in hospital room?: A Little Help needed climbing 3-5 steps with a railing? : A Little 6 Click Score: 19    End of Session Equipment Utilized During Treatment: Gait belt Activity Tolerance: Patient tolerated treatment well;Patient limited by fatigue Patient left: with call bell/phone within reach;in bed;with bed alarm set Nurse Communication: Mobility status PT Visit Diagnosis: Other abnormalities of gait and mobility (R26.89);Unsteadiness on feet (R26.81)     Time: [id number] PT Time Calculation (min) (ACUTE ONLY): 23 min  Charges:  $Gait Training: 8-22 mins $Therapeutic Activity: 8-22 mins                     Seon Gaertner E, PT [place] Pager [phone]  Office [phone]    [place] 03/24/2019, 12:00 PM

## 2019-03-24 NOTE — TOC Initial Note (Signed)
Transition of Care [facility]) - Initial/Assessment Note    Patient Details  Name: Jeremy Sherman MRN: [id number] Date of Birth: 02/18/1951  [facility] CM/SW Contact:    Pollie Friar, RN Phone Number: 03/24/2019, 4:14 PM  Clinical Narrative:                 Plan is for IR on Wednesday. PT/OT recommending [place] services. Pt with only BCBS listed and he is 26ys old. Pt unsure if has medicare. CM will reach out to his spouse in am to see if he also has medicare.  TOC following to arrange [facility] and will order 3 in 1 to be delivered to the room closer to d/c.   Expected Discharge Plan: [place] Barriers to Discharge: Continued Medical Work up   Patient Goals and CMS Choice   CMS Medicare.gov Compare Post Acute Care list provided to:: Patient Choice offered to / list presented to : Patient  Expected Discharge Plan and Services Expected Discharge Plan: [place]   Discharge Planning Services: CM Consult Post Acute Care Choice: Home Health, Durable Medical Equipment Living arrangements for the past 2 months: Single Family Home                                      Prior Living Arrangements/Services Living arrangements for the past 2 months: Single Family Home Lives with:: Spouse Patient language and need for interpreter reviewed:: Yes Do you feel safe going back to the place where you live?: Yes      Need for Family Participation in Patient Care: Yes (Comment)     Criminal Activity/Legal Involvement Pertinent to Current Situation/Hospitalization: No - Comment as needed  Activities of Daily Living Home Assistive Devices/Equipment: Cane (specify quad or straight) ADL Screening (condition at time of admission) Patient's cognitive ability adequate to safely complete daily activities?: Yes Is the patient deaf or have difficulty hearing?: No Does the patient have difficulty seeing, even when wearing glasses/contacts?:  No Does the patient have difficulty concentrating, remembering, or making decisions?: No Patient able to express need for assistance with ADLs?: Yes Does the patient have difficulty dressing or bathing?: No Independently performs ADLs?: Yes (appropriate for developmental age) Does the patient have difficulty walking or climbing stairs?: Yes Weakness of Legs: Both Weakness of Arms/Hands: None  Permission Sought/Granted                  Emotional Assessment Appearance:: Appears stated age Attitude/Demeanor/Rapport: Engaged Affect (typically observed): Accepting Orientation: : Oriented to Self, Oriented to Place, Oriented to  Time, Oriented to Situation   Psych Involvement: No (comment)  Admission diagnosis:  Stroke [facility]) [I63.9] Patient Active Problem List   Diagnosis Date Noted  . Carotid stenosis 03/21/2019  . CAD (coronary artery disease) 03/21/2019  . Brain aneurysm 03/21/2019  . Stroke ([place]) 03/20/2019   PCP:  Imagene Riches, NP Pharmacy:   [id number] - [place], [place] - [address] [facility] [place] [address] Phone: [phone] Fax: [phone]     Social Determinants of Health (SDOH) Interventions    Readmission Risk Interventions No flowsheet data found.

## 2019-03-24 NOTE — Progress Notes (Signed)
ANTICOAGULATION CONSULT NOTE  Pharmacy Consult for Heparin Indication: Left Atrial Appendage Thrombus  No Known Allergies  Patient Measurements: Height: 6\' 5"  (195.6 cm) Weight: 233 lb 14.5 oz (106.1 kg) IBW/kg (Calculated) : 89.1 Heparin Dosing Weight: 106.1 kg   Vital Signs: Temp: 100.9 F (38.3 C) (01/25 1616) Temp Source: Oral (01/25 1616) BP: 138/76 (01/25 1616) Pulse Rate: 75 (01/25 1616)  Labs: Recent Labs    03/22/19 0112 03/22/19 0725 03/23/19 0003 03/23/19 0003 03/23/19 0738 03/24/19 0238 03/24/19 1548  HGB 12.7*  --  12.7*  --   --  11.4*  --   HCT 37.7*  --  37.5*  --   --  33.2*  --   PLT 199  --  193  --   --  194  --   HEPARINUNFRC 0.34   < > 0.73*   < > 0.44 0.15* 0.14*  CREATININE 1.06  --  1.43*  --   --  1.52*  --    < > = values in this interval not displayed.    Estimated Creatinine Clearance: 57 mL/min (A) (by C-G formula based on SCr of 1.52 mg/dL (H)).   Assessment: Pharmacy is consulted to dose heparin in 69 yr old male with left atrial appendage thrombus shown on TEE from 03/20/19 at [facility]. 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  Heparin level subtherapeutic at 0.14, on 750 units/hr. CBC stable. No s/sx of bleeding- some bloody urine. Plans noted this week for procedure to fix aneurysm and M1 stenosis.   Goal of Therapy:  Heparin level: 0.3-0.5 units/ml Monitor platelets by anticoagulation protocol: Yes   Plan:  -Increase heparin to 900 units / hr -8 hour heparin level -Daily heparin level and CBC  Antonietta Jewel, PharmD, BCCCP Clinical Pharmacist  Phone: [phone]  Please check AMION for all [place] phone numbers After 10:00 PM, call [place] [phone]

## 2019-03-24 NOTE — Progress Notes (Signed)
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  Apparently presented to [facility] ER on 03/17/2019 for dizziness and syncope at the PCP office.  Patient was unresponsive, hypotensive and diaphoretic.  In ED bp 158/77,   CT brain  03/17/2019 8x25mm focal slight dense lesion in the left and anterior callosal region.    MRI brain 03/17/19 Early subacute infarct within the right cerebellum and right brachium pontis  Punctate acute left parietal lobe cortical infarct  9x74mm lobular focus of enhancement in the pericallosal region w imaging features most suggestive of ACA pericallosal aneurysm, CTA recommended.     Chronic lacunar infarct in the right caudate nucleus  CTA head/ neck 4x7 mm anuerysm left pericallosal segment left ACA without evidence of rupture.   Mild stenosis of right M1 segment,  Moderate to severe stenosis left M1 segment poststenotic dilation.  Mild stenoss in the PCA bilaterally  Atherosclerotic disease, mural thrombus in the prox left ICA narrowing the lumen by 50%.  Eccentric atherosclerotic dsiease vs focal dissection in the left ICA below the skull base without significant stenosis  TEE 03/20/19 EF 45-50% , left atrial appendage thrombus, and mild MR   Subjective   Patient seen and examined, denies any complaints.   Assessment/Plan:     1. M1 stenosis, left ACA aneurysm, mild L ICA stenosis-patient underwent cerebral arteriogram per neuro interventional neurology.  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  Plan for intervention  early next week as per Dr. Estanislado Pandy.  Patient is currently on Plavix 75 mg p.o. daily.  Also started on aspirin 81 mg daily per neurology.plan for cerebral arteriogram with possible stent placement of left MCA M1 stenosis and/or embolization of left ACA pericallosal aneurysm plan for 03/26/2019 by neuro intervention radiology.  2. Left atrial appendage thrombus-patient has left atrial appendage thrombus, seen on TEE from 03/20/2019 at [facility] and discussed with cardiologist Dr. Sallyanne Kuster, he recommends starting anticoagulation immediately if okay with neuro intervention radiology.  I called and discussed with Dr. Estanislado Pandy, he is okay with starting anticoagulation.  Patient started on heparin.  Consider switching to Eliquis after the procedure.  3. Right foot ischemia-patient was found to have absent peripheral pulses in right foot, vascular surgery was consulted.  Dr. Eden Lathe has seen the patient and ordered duplex ultrasound of the right lower extremity with bilateral ABIs.  He has severe PAD, chronic.  Patient is asymptomatic at this time.  No intervention planned in the hospital.  Patient to follow-up as outpatient.  4. Bilateral stroke, right cerebellum, right brachium pontis-patient was taking aspirin 81 mg daily at home.  He has been started here on Plavix, and aspirin. Continue statin. Neurology following.  5. Syncope-multifactorial likely from polypharmacy causing bradycardia, hypotension, permissive hypertension.  Continue to monitor patient blood pressure in the hospital.  6. Bradycardia-likely from carvedilol which is on hold.  Patient also has hypothyroidism.  7. Hypothyroidism-TSH was significantly elevated 111 with T3 and T4 lower than normal.  On Synthroid 100 mcg daily.  Recheck TSH--45.8, T3 and T4--0.64.  8. Hypokalemia-replete  9. Abdominal mass-palpable mass noted in  left lower quadrant/lower abdomen.  X-ray of the abdomen showed distended urinary bladder.  Will  obtain bladder scan and in and out cath as needed.  Will start him on Flomax 0.4 mg p.o. daily.  Patient continues to have urinary retention, will insert Foley catheter.  He will need urology follow-up as outpatient.  10. Acute kidney injury-patient baseline creatinine is around one.  Creatinine has jumped up to 1.52.  Started on Flomax as above.  Foley catheter inserted as above.  Start gentle hydration with normal saline at 75 mill per hour.  Follow BMP in a.m. We will obtain renal ultrasound.    SpO2: 98 % O2 Flow Rate (L/min): 2 L/min      CBG: Recent Labs  Lab 03/23/19 1619 03/23/19 2016 03/23/19 2319 03/24/19 0353 03/24/19 0830  GLUCAP 103* 106* 111* 111* 102*    CBC: Recent Labs  Lab 03/21/19 0413 03/22/19 0112 03/23/19 0003 03/24/19 0238  WBC 4.7 5.2 5.7 4.5  HGB 13.4 12.7* 12.7* 11.4*  HCT 39.4 37.7* 37.5* 33.2*  MCV 93.6 93.8 93.5 92.7  PLT PLATELET CLUMPS NOTED ON SMEAR, UNABLE TO ESTIMATE 199 193 [id number]    Basic Metabolic Panel: Recent Labs  Lab 03/20/19 2008 03/21/19 0413 03/22/19 0112 03/23/19 0003 03/24/19 0238  NA  --  136 136 135 136  K  --  3.2* 3.3* 3.6 3.8  CL  --  98 101 102 102  CO2  --  25 23 22 22   GLUCOSE  --  94 109* 108* 109*  BUN  --  15 11 15 18   CREATININE 1.14 1.12 1.06 1.43* 1.52*  CALCIUM  --  8.9 9.0 8.9 8.5*     Liver Function Tests: Recent Labs  Lab 03/21/19 0413 03/22/19 0112  AST 23 22  ALT 16 15  ALKPHOS 61 64  BILITOT 0.9 0.7  PROT 7.4 7.6  ALBUMIN 3.5 3.5        DVT prophylaxis: Heparin  Code Status: Full code  Family Communication: No family at bedside  Disposition Plan: likely home when medically ready for discharge       Scheduled medications:  . aspirin EC  81 mg Oral Daily  . atorvastatin  80 mg Oral q1800  . clopidogrel  75 mg Oral Daily  . insulin aspart  0-9 Units Subcutaneous Q4H  . levothyroxine  100 mcg Oral Q0600  . lidocaine  1 application Urethral Once  . tamsulosin  0.4 mg  Oral Daily    Consultants:  Neuro intervention radiology  Vascular surgery  Neurology  Procedures:  Cerebral arteriogram  Antibiotics:   Anti-infectives (From admission, onward)   None       Objective   Vitals:   03/23/19 2017 03/23/19 2322 03/24/19 0357 03/24/19 0753  BP: (!) 142/71 121/80 117/74 138/73  Pulse: 76 64 69 68  Resp: 18 17 18 16   Temp: 99 F (37.2 C) 98.4 F (36.9 C) 99.6 F (37.6 C) 99.3 F (37.4 C)  TempSrc: Oral Oral Oral Axillary  SpO2: 97% 97% 97% 98%  Weight:      Height:        Intake/Output Summary (Last 24 hours) at 03/24/2019 1213 Last data filed at 03/24/2019 0615 Gross per 24 hour  Intake 455.06 ml  Output 1875 ml  Net -1419.94 ml    01/23 1901 - 01/25 0700 In: 455.1 [I.V.:455.1] Out: 2175 [Urine:2175]  Filed Weights   03/20/19 1842  Weight: 106.1 kg    Physical Examination:  General-appears in no acute distress  Heart-S1-S2, regular, no murmur auscultated  Lungs-clear to auscultation bilaterally, no wheezing or crackles auscultated  Abdomen-soft, nontender, no organomegaly  Extremities-no edema in the lower extremities  Neuro-alert, oriented x3, no focal deficit noted     Studies:  DG Abd 2 Views  Result Date: 03/23/2019 CLINICAL DATA:  Patient status post stroke.  Abdominal distension. EXAM: ABDOMEN - 2 VIEW COMPARISON:  None. FINDINGS: The bowel gas pattern is normal. There is no evidence of free air. No radio-opaque calculi or other significant radiographic abnormality is seen. Contrast in a distended urinary bladder from the patient's angiogram is noted. IMPRESSION: Normal bowel gas pattern. Distended urinary bladder. Electronically Signed   By: Inge Rise M.D.   On: 03/23/2019 11:24     Admission status: Inpatient: Based on patients clinical presentation and evaluation of above clinical data, I have made determination that patient meets Inpatient criteria at this time.   Oswald Hillock   [facility] If 7PM-7AM, please contact night-coverage at [email or web address], Office  [phone]  password [email or web address]  03/24/2019, 12:13 PM  LOS: 4 days

## 2019-03-25 ENCOUNTER — Encounter (HOSPITAL_COMMUNITY): Payer: Self-pay | Admitting: Anesthesiology

## 2019-03-25 DIAGNOSIS — R338 Other retention of urine: Secondary | ICD-10-CM

## 2019-03-25 LAB — BASIC METABOLIC PANEL
Anion gap: 10 (ref 5–15)
BUN: 14 mg/dL (ref 8–23)
CO2: 21 mmol/L — ABNORMAL LOW (ref 22–32)
Calcium: 8.4 mg/dL — ABNORMAL LOW (ref 8.9–10.3)
Chloride: 104 mmol/L (ref 98–111)
Creatinine, Ser: 1.22 mg/dL (ref 0.61–1.24)
GFR calc Af Amer: >60 mL/min (ref >60)
GFR calc non Af Amer: 60 mL/min — ABNORMAL LOW (ref >60)
Glucose, Bld: 106 mg/dL — ABNORMAL HIGH (ref 70–99)
Potassium: 3.5 mmol/L (ref 3.5–5.1)
Sodium: 135 mmol/L (ref 135–145)

## 2019-03-25 LAB — GLUCOSE, CAPILLARY
Glucose-Capillary: 101 mg/dL — ABNORMAL HIGH (ref 70–99)
Glucose-Capillary: 102 mg/dL — ABNORMAL HIGH (ref 70–99)
Glucose-Capillary: 103 mg/dL — ABNORMAL HIGH (ref 70–99)
Glucose-Capillary: 104 mg/dL — ABNORMAL HIGH (ref 70–99)
Glucose-Capillary: 110 mg/dL — ABNORMAL HIGH (ref 70–99)
Glucose-Capillary: 120 mg/dL — ABNORMAL HIGH (ref 70–99)
Glucose-Capillary: 86 mg/dL (ref 70–99)

## 2019-03-25 LAB — URINALYSIS, COMPLETE (UACMP) WITH MICROSCOPIC
Bilirubin Urine: NEGATIVE
Glucose, UA: NEGATIVE mg/dL
Ketones, ur: NEGATIVE mg/dL
Nitrite: NEGATIVE
Protein, ur: 30 mg/dL — AB
RBC / HPF: >50 RBC/hpf — ABNORMAL HIGH (ref 0–5)
Specific Gravity, Urine: 1.012 (ref 1.005–1.030)
WBC, UA: >50 WBC/hpf — ABNORMAL HIGH (ref 0–5)
pH: 5 (ref 5.0–8.0)

## 2019-03-25 LAB — CBC
HCT: 30 % — ABNORMAL LOW (ref 39.0–52.0)
Hemoglobin: 10.3 g/dL — ABNORMAL LOW (ref 13.0–17.0)
MCH: 31.9 pg (ref 26.0–34.0)
MCHC: 34.3 g/dL (ref 30.0–36.0)
MCV: 92.9 fL (ref 80.0–100.0)
Platelets: 177 10*3/uL (ref 150–400)
RBC: 3.23 MIL/uL — ABNORMAL LOW (ref 4.22–5.81)
RDW: 15.1 % (ref 11.5–15.5)
WBC: 4.8 10*3/uL (ref 4.0–10.5)
nRBC: 0 % (ref 0.0–0.2)

## 2019-03-25 LAB — HEPARIN LEVEL (UNFRACTIONATED): Heparin Unfractionated: 0.3 IU/mL (ref 0.30–0.70)

## 2019-03-25 LAB — SARS CORONAVIRUS 2 (TAT 6-24 HRS): SARS Coronavirus 2: NEGATIVE

## 2019-03-25 MED ORDER — LEVOFLOXACIN IN D5W 750 MG/150ML IV SOLN
750.0000 mg | INTRAVENOUS | Status: DC
  Administered 2019-03-25 – 2019-03-27 (×2): 750 mg via INTRAVENOUS
  Filled 2019-03-25 (×4): qty 150

## 2019-03-25 NOTE — Progress Notes (Addendum)
Referring Physician(s): Dr. Jani Gravel   Supervising Physician: Luanne Bras  Patient Status:  [facility] - In-pt  Chief Complaint: None   Subjective: Acute CVA s/p diagnostic cerebral arteriogram revealing severe left MCA M1 segment stenosis, a left ACA pericallosal aneurysm, and proximal left ICA stenosis. Patient alert and laying in bed.   Allergies: Patient has no known allergies.  Medications: Prior to Admission medications   Medication Sig Start Date End Date Taking? Authorizing Provider  aspirin EC 81 MG tablet Take 81 mg by mouth daily.   Yes [provider]  omega-3 acid ethyl esters (LOVAZA) 1 g capsule Take 1 g by mouth daily.   Yes [provider]     Vital Signs: BP (!) 150/76 (BP Location: Left Arm)   Pulse 67   Temp 98.6 F (37 C) (Oral)   Resp 20   Ht 6\' 5"  (1.956 m)   Wt 233 lb 14.5 oz (106.1 kg)   SpO2 97%   BMI 27.74 kg/m   Physical Exam Vitals and nursing note reviewed.  Constitutional:      Appearance: He is well-developed.  HENT:     Head: Normocephalic.  Cardiovascular:     Rate and Rhythm: Normal rate and regular rhythm.  Pulmonary:     Effort: Pulmonary effort is normal.     Breath sounds: Normal breath sounds.  Musculoskeletal:        General: Normal range of motion.     Cervical back: Normal range of motion.  Skin:    General: Skin is dry.     Comments: Surgical incision to sternum.  Neurological:     Mental Status: He is alert and oriented to person, place, and time.     Imaging: US RENAL  Result Date: 03/24/2019 CLINICAL DATA:  Elevated BUN and creatinine EXAM: RENAL / URINARY TRACT ULTRASOUND COMPLETE COMPARISON:  None. FINDINGS: Right Kidney: Renal measurements: 10.4 x 5.4 x 5.4 cm = volume: 158 mL. 1.4 cm midpole cyst. Normal echotexture. No suspicious mass or hydronephrosis. Left Kidney: Renal measurements: 11.1 x 6.4 x 4.8 cm = volume: 179 mL. Echogenicity within normal limits. No mass or  hydronephrosis visualized. Bladder: Decompressed with Foley catheter in place. Other: None. IMPRESSION: No acute findings.  No hydronephrosis. Electronically Signed   By: Rolm Baptise M.D.   On: 03/24/2019 21:58   DG Abd 2 Views  Result Date: 03/23/2019 CLINICAL DATA:  Patient status post stroke.  Abdominal distension. EXAM: ABDOMEN - 2 VIEW COMPARISON:  None. FINDINGS: The bowel gas pattern is normal. There is no evidence of free air. No radio-opaque calculi or other significant radiographic abnormality is seen. Contrast in a distended urinary bladder from the patient's angiogram is noted. IMPRESSION: Normal bowel gas pattern. Distended urinary bladder. Electronically Signed   By: Inge Rise M.D.   On: 03/23/2019 11:24   VAS [place] ABI WITH/WO TBI  Result Date: 03/22/2019 LOWER EXTREMITY DOPPLER STUDY Indications: Peripheral artery disease, and cold right foot post op angio. High Risk Factors: Hypertension, hyperlipidemia, coronary artery disease.  Comparison Study: no prior Performing Technologist: Abram Sander RVS  Examination Guidelines: A complete evaluation includes at minimum, Doppler waveform signals and systolic blood pressure reading at the level of bilateral brachial, anterior tibial, and posterior tibial arteries, when vessel segments are accessible. Bilateral testing is considered an integral part of a complete examination. Photoelectric Plethysmograph (PPG) waveforms and toe systolic pressure readings are included as required and additional duplex testing as needed. Limited examinations  for reoccurring indications may be performed as noted.  ABI Findings: +--------+------------------+-----+---------+-----------+ Right   Rt Pressure (mmHg)IndexWaveform Comment     +--------+------------------+-----+---------+-----------+ [id number]                    triphasic            +--------+------------------+-----+---------+-----------+ PTA                                     not  audible +--------+------------------+-----+---------+-----------+ PERO                                                +--------+------------------+-----+---------+-----------+ DP                                      not audible +--------+------------------+-----+---------+-----------+ +--------+------------------+-----+---------+-----------+ Left    Lt Pressure (mmHg)IndexWaveform Comment     +--------+------------------+-----+---------+-----------+ [id number]                    triphasic            +--------+------------------+-----+---------+-----------+ PTA                                     not audible +--------+------------------+-----+---------+-----------+ DP                                      not audible +--------+------------------+-----+---------+-----------+  Summary: Right: Unable to obtain DP and PTA. Not audbile. Right arterial duplex showed occluded SFA and popliteal artery. Left: Unable to obtain DP and PTA. Not audbile. Monophasic doppler flow noted in AT PT and peroneal right leg on duplex  *See table(s) above for measurements and observations.  Electronically signed by Ruta Hinds MD on 03/22/2019 at 9:39:19 AM.   Final    VAS [place] LOWER EXTREMITY ARTERIAL DUPLEX  Result Date: 03/22/2019 LOWER EXTREMITY ARTERIAL DUPLEX STUDY Indications: Peripheral artery disease.  Current ABI: n/a Limitations: right foot cool post angio Comparison Study: no prior Performing Technologist: Abram Sander RVS  Examination Guidelines: A complete evaluation includes B-mode imaging, spectral Doppler, color Doppler, and power Doppler as needed of all accessible portions of each vessel. Bilateral testing is considered an integral part of a complete examination. Limited examinations for reoccurring indications may be performed as noted.  +-----------+--------+-----+--------+----------+--------+ RIGHT      PSV cm/sRatioStenosisWaveform  Comments  +-----------+--------+-----+--------+----------+--------+ CFA Prox   108                  biphasic           +-----------+--------+-----+--------+----------+--------+ DFA        47                   monophasic         +-----------+--------+-----+--------+----------+--------+ SFA Prox                occluded                   +-----------+--------+-----+--------+----------+--------+ SFA Mid  occluded                   +-----------+--------+-----+--------+----------+--------+ SFA Distal              occluded                   +-----------+--------+-----+--------+----------+--------+ POP Prox                occluded                   +-----------+--------+-----+--------+----------+--------+ ATA Distal 9                    monophasic         +-----------+--------+-----+--------+----------+--------+ PTA Distal 16                   monophasic         +-----------+--------+-----+--------+----------+--------+ PERO Distal10                   monophasic         +-----------+--------+-----+--------+----------+--------+   Summary: Right: Total occlusion noted in the superficial femoral artery. Total occlusion noted in the superficial femoral artery and/or popliteal artery. Total occlusion noted in the popliteal artery.  See table(s) above for measurements and observations. Electronically signed by Ruta Hinds MD on 03/22/2019 at 9:38:33 AM.    Final    IR ANGIO INTRA EXTRACRAN SEL COM CAROTID INNOMINATE BILAT MOD SED  Result Date: 03/25/2019 CLINICAL DATA:  Recent history of aphasia and transient right-sided weakness. Workup revealed severe high-grade stenosis of the proximal left middle cerebral artery M1 segment on CT angiogram of the head neck. 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 EXAM: BILATERAL COMMON CAROTID AND INNOMINATE ANGIOGRAPHY COMPARISON:  CT angiogram of the head and neck of March 19, 2019.  MEDICATIONS: Heparin 1000 units IV; no antibiotic was administered within 1 hour of the procedure. ANESTHESIA/SEDATION: Versed 1 mg IV; Fentanyl 25 mcg IV Moderate Sedation Time:  41 minutes The patient was continuously monitored during the procedure by the interventional radiology nurse under my direct supervision. CONTRAST:  Isovue 300 approximately 60 mL. FLUOROSCOPY TIME:  Fluoroscopy Time: 10 minutes 12 seconds (1256 mGy). COMPLICATIONS: None immediate. TECHNIQUE: Informed written consent was obtained from the patient after a thorough discussion of the procedural risks, benefits and alternatives. All questions were addressed. Maximal Sterile Barrier Technique was utilized including caps, mask, sterile gowns, sterile gloves, sterile drape, hand hygiene and skin antiseptic. A timeout was performed prior to the initiation of the procedure. The right radial artery was interrogated and imaged for a right radial approach with ultrasound. The artery was noted to be severely narrowed distally. Therefore, this approach was not used. The right groin was prepped and draped in the usual sterile fashion. Thereafter using modified Seldinger technique, transfemoral access into the right common femoral artery was obtained without difficulty. Over a 0.035 inch guidewire, a 5 French Pinnacle sheath was inserted. Through this, and also over 0.035 inch guidewire, a 5 [place] JB 1 catheter was advanced to the aortic arch region and selectively positioned in the right common carotid artery, right subclavian artery, the left common carotid artery and the left subclavian artery. FINDINGS: The innominate artery injection demonstrates the origins of the right subclavian artery and the right common carotid artery to be widely patent. The right common carotid arteriogram demonstrates the right external carotid artery and its major branches to be widely patent. The right internal carotid  artery just distal to the bulb has a mild  approximately 20% stenosis by the NASCET criteria. No evidence of intraluminal filling defects or of ulcerations is seen. More distally, the vessel is seen to opacify to the cranial skull base. The petrous segment is widely patent. There is diffuse narrowing of the cavernous segment with approximately 50% narrowing of the right internal carotid supraclinoid segment. The right middle cerebral artery proximally demonstrates mild arteriosclerotic narrowing. The superior division demonstrates wide patency into the capillary and venous phases. The inferior division demonstrates a segmental area of moderate stenosis. Hypoplastic right anterior cerebral artery is seen with angiographic occlusion in the mid A2 segment. The origin of the right vertebral artery is widely patent. The vessel is seen to opacify to the cranial skull base with mild areas of focal narrowing. The right vertebrobasilar junction is widely patent. The right posteroinferior cerebellar artery demonstrates a high-grade stenosis in its proximal 1/3. More distally, the opacified portion of the basilar artery, the superior cerebellar arteries and transiently the posterior cerebral arteries and the anterior-inferior cerebellar arteries demonstrate patency into the capillary and venous phases. Unopacified blood is seen in hypoplastic basilar artery from the contralateral vertebral artery. The left common carotid arteriogram demonstrates the left external carotid artery and its major branches to be widely patent. The left internal carotid artery just distal to the bulb has a smooth area of atherosclerotic disease associated with narrowing of approximately 75%. No evidence of intraluminal filling defects is seen. More distally, the left internal carotid artery is seen to opacify to the cranial skull base. Wide patency is seen of the petrous segment. Tapered narrowing of the cavernous segment to the level of the takeoff of the ophthalmic artery. Mild focal  irregularity is also seen of the supraclinoid left ICA. A left posterior communicating artery is seen opacifying the left posterior cerebral artery distribution with focal areas of caliber irregularity. The left middle cerebral artery M1 segment demonstrates severe 90% stenosis. Patency of the left MCA trifurcation branches is seen into the capillary and the venous phases. The left anterior cerebral artery opacifies into the capillary and venous phases. Arising from the distal pericallosal artery is a large saccular aneurysm with mild lobulation. This is measured at approximately 9.7 mm x 7.0 mm. The left subclavian arteriogram demonstrates atherosclerotic disease involving the proximal left subclavian artery with a mild tapered stenosis of the artery proximal to the origin of the left vertebral artery. The left vertebral artery is seen to opacify to the cranial skull base. Patency is seen of the left vertebrobasilar junction and the basilar artery. The opacified portion of the left posteroinferior cerebellar artery demonstrates wide patency. Also the opacified portions of the basilar artery, the posterior cerebral arteries and the superior cerebellar arteries is grossly intact. Mixing of unopacified blood is seen from contralateral right vertebral artery. 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 Approximately 75% stenosis of the proximal left internal carotid artery just distal to the bulb. Probable angiographic occlusion of the right anterior cerebral artery, in the mid A2 segment. PLAN: Above findings were discussed with the patient. Prior to consideration of treatment of the left middle cerebral artery M1 segment symptomatic stenosis, and of the large aneurysm in the left pericallosal region, further workup regarding the lower extremity peripheral vascular  disease may be necessary given the severely depleted pulses in both feet left greater  than right. Electronically Signed   By: Luanne Bras M.D.   On: 03/21/2019 15:11   IR ANGIO VERTEBRAL SEL SUBCLAVIAN INNOMINATE BILAT MOD SED  Result Date: 03/21/2019 CLINICAL DATA:  Recent history of aphasia and transient right-sided weakness. Workup revealed severe high-grade stenosis of the proximal left middle cerebral artery M1 segment on CT angiogram of the head neck. 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  EXAM: BILATERAL COMMON CAROTID AND INNOMINATE ANGIOGRAPHY  COMPARISON:  CT angiogram of the head and neck of March 19, 2019.  MEDICATIONS: Heparin 1000 units IV; no antibiotic was administered within 1 hour of the procedure.  ANESTHESIA/SEDATION: Versed 1 mg IV; Fentanyl 25 mcg IV  Moderate Sedation Time:  41 minutes  The patient was continuously monitored during the procedure by the interventional radiology nurse under my direct supervision.  CONTRAST:  Isovue 300 approximately 60 mL.  FLUOROSCOPY TIME:  Fluoroscopy Time: 10 minutes 12 seconds (1256 mGy).  COMPLICATIONS: None immediate.  TECHNIQUE: Informed written consent was obtained from the patient after a thorough discussion of the procedural risks, benefits and alternatives. All questions were addressed. Maximal Sterile Barrier Technique was utilized including caps, mask, sterile gowns, sterile gloves, sterile drape, hand hygiene and skin antiseptic. A timeout was performed prior to the initiation of the procedure.  The right radial artery was interrogated and imaged for a right radial approach with ultrasound. The artery was noted to be severely narrowed distally. Therefore, this approach was not used.  The right groin was prepped and draped in the usual sterile fashion. Thereafter using modified Seldinger technique, transfemoral access into the right common femoral artery was obtained without difficulty. Over a  0.035 inch guidewire, a 5 French Pinnacle sheath was inserted. Through this, and also over 0.035 inch guidewire, a 5 [place] JB 1 catheter was advanced to the aortic arch region and selectively positioned in the right common carotid artery, right subclavian artery, the left common carotid artery and the left subclavian artery.  FINDINGS: The innominate artery injection demonstrates the origins of the right subclavian artery and the right common carotid artery to be widely patent.  The right common carotid arteriogram demonstrates the right external carotid artery and its major branches to be widely patent.  The right internal carotid artery just distal to the bulb has a mild approximately 20% stenosis by the NASCET criteria. No evidence of intraluminal filling defects or of ulcerations is seen.  More distally, the vessel is seen to opacify to the cranial skull base.  The petrous segment is widely patent.  There is diffuse narrowing of the cavernous segment with approximately 50% narrowing of the right internal carotid supraclinoid segment. The right middle cerebral artery proximally demonstrates mild arteriosclerotic narrowing. The superior division demonstrates wide patency into the capillary and venous phases. The inferior division demonstrates a segmental area of moderate stenosis. Hypoplastic right anterior cerebral artery is seen with angiographic occlusion in the mid A2 segment.  The origin of the right vertebral artery is widely patent. The vessel is seen to opacify to the cranial skull base with mild areas of focal narrowing. The right vertebrobasilar junction is widely patent.  The right posteroinferior cerebellar artery demonstrates a high-grade stenosis in its proximal 1/3.  More distally, the opacified portion of the basilar artery, the superior cerebellar arteries and transiently the posterior cerebral arteries and the anterior-inferior cerebellar arteries demonstrate patency into the capillary  and venous phases. Unopacified blood is seen in hypoplastic basilar artery from the contralateral vertebral  artery.  The left common carotid arteriogram demonstrates the left external carotid artery and its major branches to be widely patent.  The left internal carotid artery just distal to the bulb has a smooth area of atherosclerotic disease associated with narrowing of approximately 75%. No evidence of intraluminal filling defects is seen.  More distally, the left internal carotid artery is seen to opacify to the cranial skull base. Wide patency is seen of the petrous segment.  Tapered narrowing of the cavernous segment to the level of the takeoff of the ophthalmic artery.  Mild focal irregularity is also seen of the supraclinoid left ICA. A left posterior communicating artery is seen opacifying the left posterior cerebral artery distribution with focal areas of caliber irregularity.  The left middle cerebral artery M1 segment demonstrates severe 90% stenosis. Patency of the left MCA trifurcation branches is seen into the capillary and the venous phases.  The left anterior cerebral artery opacifies into the capillary and venous phases.  Arising from the distal pericallosal artery is a large saccular aneurysm with mild lobulation. This is measured at approximately 9.7 mm x 7.0 mm.  The left subclavian arteriogram demonstrates atherosclerotic disease involving the proximal left subclavian artery with a mild tapered stenosis of the artery proximal to the origin of the left vertebral artery.  The left vertebral artery is seen to opacify to the cranial skull base. Patency is seen of the left vertebrobasilar junction and the basilar artery. The opacified portion of the left posteroinferior cerebellar artery demonstrates wide patency.  Also the opacified portions of the basilar artery, the posterior cerebral arteries and the superior cerebellar arteries is grossly intact. Mixing of unopacified blood is seen  from contralateral right vertebral artery.  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  Approximately 75% stenosis of the proximal left internal carotid artery just distal to the bulb. Probable angiographic occlusion of the right anterior cerebral artery, in the mid A2 segment.  PLAN: Above findings were discussed with the patient. Prior to consideration of treatment of the left middle cerebral artery M1 segment symptomatic stenosis, and of the large aneurysm in the left pericallosal region, further workup regarding the lower extremity peripheral vascular disease may be necessary given the severely depleted pulses in both feet left greater than right.   Electronically Signed   By: Luanne Bras M.D.   On: 03/21/2019 15:11   Labs:  CBC: Recent Labs    03/22/19 0112 03/23/19 0003 03/24/19 0238 03/25/19 0225  WBC 5.2 5.7 4.5 4.8  HGB 12.7* 12.7* 11.4* 10.3*  HCT 37.7* 37.5* 33.2* 30.0*  PLT 199 193 194 177    COAGS: Recent Labs    03/21/19 0413  INR 1.0    BMP: Recent Labs    03/22/19 0112 03/23/19 0003 03/24/19 0238 03/25/19 0225  NA 136 135 136 135  K 3.3* 3.6 3.8 3.5  CL 101 102 102 104  CO2 23 22 22  21*  GLUCOSE 109* 108* 109* 106*  BUN 11 15 18 14   CALCIUM 9.0 8.9 8.5* 8.4*  CREATININE 1.06 1.43* 1.52* 1.22  GFRNONAA >60 49* 46* 60*  GFRAA >60 57* 53* >60    LIVER FUNCTION TESTS: Recent Labs    03/21/19 0413 03/22/19 0112  BILITOT 0.9 0.7  AST 23 22  ALT 16 15  ALKPHOS 61 64  PROT 7.4 7.6  ALBUMIN 3.5 3.5  Assessment and Plan:  Acute CVA s/p diagnostic cerebral arteriogram revealing severe left MCA M1 segment stenosis, a left ACA pericallosal aneurysm, and proximal left ICA stenosis. Discussed management of above with patient and patient's wife via telephone with Dr. Estanislado Pandy.  Explained  procedure, including risks and benefits. Explained that due to patient's severe PAD, recommend treating left MCA stenosis and left ACA aneurysm during same procedure. Patient in agreement with plan of care.  After direct discussion with Dr. Estanislado Pandy and Dr. Oneida Alar ([facility]) procedure will be performed using a  direct carotid stick for access during intervention which wil lbe performed by [facility].   Plan for image-guided cerebral arteriogram with possible revascularization (angioplasty/stent placement) of left MCA M1 stenosis and/or embolization of left ACA pericallosal aneurysm via direct carotid stick access (by [facility]) with Dr. Estanislado Pandy. recommendations  - Continue taking Plavix 75 mg once on 1.26.21 and 150 mg on 1.27.21  - Aspirin 81 mg to be given daily   - NPO at midnight  - P2Y12 the morning of 1.27.21  Recommendations regarding Heparin gtt from [facility].  From NIR standpoint okay to continue Heparin.   NIR to follow.  Tentatively planned for 1.27.21 pending OR availability.  Risks and benefits of cerebral arteriogram with intervention were discussed with the patient including, but not limited to bleeding, infection, vascular injury, contrast induced renal failure, stroke, reperfusion hemorrhage, or even death. This interventional procedure involves the use of X-rays and because of the nature of the planned procedure, it is possible that we will have prolonged use of X-ray fluoroscopy. Potential radiation risks to you include (but are not limited to) the following: - A slightly elevated risk for cancer  several years later in life. This risk is typically less than 0.5% percent. This risk is low in comparison to the normal incidence of human cancer, which is 33% for women and 50% for men according to the [employer]. - Radiation induced injury can include skin redness, resembling a rash, tissue breakdown / ulcers and hair loss (which can be temporary or permanent).  The likelihood of either  of these occurring depends on the difficulty of the procedure and whether you are sensitive to radiation due to previous procedures, disease, or genetic conditions.  IF your procedure requires a prolonged use of radiation, you will be notified and given written instructions for further action.  It is your responsibility to monitor the irradiated area for the 2 weeks following the procedure and to notify your physician if you are concerned that you have suffered a radiation induced injury.   All of the patient's questions were answered, patient is agreeable to proceed. Consent signed and in chart.    Electronically Signed: Avel Peace, NP 03/25/2019, 9:43 AM   I spent a total of 35 Minutes at the the patient's bedside AND on the patient's hospital floor or unit, greater than 50% of which was counseling/coordinating care for cerebral arteriogram   Addendum: UA results show UTI after direct discussion with Dr. Estanislado Pandy patient will be placed on levaquin 750 mg IV daily X 5 days. Patient will remain on NIR schedule for 1.27.21

## 2019-03-25 NOTE — Progress Notes (Signed)
Rehab Admissions Coordinator Note:  Per PT recommendation, patient was screened by Michel Santee for appropriateness for an Inpatient Acute Rehab Consult.  At this time, pt is performing at too high of a level for CIR. Would recommend f/u with a lower level of care if pt and family do not feel they can manage at home.   Michel Santee 03/25/2019, 1:11 PM  I can be reached at [facility], [place], DPT Admissions Coordinator [phone] 03/25/19  1:12 PM  .

## 2019-03-25 NOTE — Plan of Care (Signed)

## 2019-03-25 NOTE — Progress Notes (Signed)
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 s/p arteriogram which additionally reveals L ACA pericallosal aneurysm    PT Comments    Pt agreeable to PT this session. Pt presents with LLE weakness noticed in dynamic standing this session, not present yesterday. Pt tolerated short distance ambulation in hallway with use of RW, but pt appears and states he feels weaker today vs prior PT sessions. Pt reports no vision changes, no altered sensation, today vs yesterday. Pt's goal is to return to independence and maximize function upon d/c, PT feels pt would be good CIR candidate at this time given current presentation and planned aneurysm repair tomorrow. PT to continue to follow acutely, CIR screening order placed.    Follow Up Recommendations  CIR;Supervision/Assistance - 24 hour     Equipment Recommendations  None recommended by PT    Recommendations for Other Services       Precautions / Restrictions Precautions Precautions: Fall Restrictions Weight Bearing Restrictions: No    Mobility  Bed Mobility Overal bed mobility: Needs Assistance Bed Mobility: Supine to Sit;Sit to Supine     Supine to sit: HOB elevated;Min assist Sit to supine: Min guard;HOB elevated   General bed mobility comments: Min assist for supine to sit for scooting to EOB, trunk elevation. Very increased time with use of bedrails. Min guard for return to supine.  Transfers Overall transfer level: Needs assistance Equipment used: Rolling walker (2 wheeled) Transfers: Sit to/from Stand Sit to Stand: Min assist         General transfer comment: min assist for initial power up, steadying upon  standing. Frequent verbal cuing for hand placement when performing sit to stands (performed for exercise, see below)  Ambulation/Gait Ambulation/Gait assistance: Min assist Gait Distance (Feet): 60 Feet Assistive device: Rolling walker (2 wheeled) Gait Pattern/deviations: Wide base of support;Step-through pattern;Decreased stride length;Trunk flexed Gait velocity: decr   General Gait Details: Min assist for steadying, verbal cuing for upright posture and use of RW to offweight LEs as needed for weakness. Pt with mild L knee buckling noted this session, corrected by pt. Pt with standing rest breaks x2, lasting ~30 seconds each.   Stairs             Wheelchair Mobility    Modified Rankin (Stroke Patients Only) Modified Rankin (Stroke Patients Only) Pre-Morbid Rankin Score: No symptoms Modified Rankin: Moderately severe disability     Balance Overall balance assessment: Needs assistance Sitting-balance support: Feet supported;No upper extremity supported Sitting balance-Leahy Scale: Good     Standing balance support: During functional activity;Single extremity supported Standing balance-Leahy Scale: Poor Standing balance comment: reliant on UE support                             Cognition Arousal/Alertness: Awake/alert Behavior During Therapy: Flat affect Overall Cognitive Status: Impaired/Different from baseline Area of Impairment: Safety/judgement;Problem solving;Awareness;Following commands                       Following Commands: Follows one step commands consistently;Follows one step commands with increased time Safety/Judgement: Decreased awareness of safety Awareness: Intellectual Problem Solving: Difficulty sequencing;Requires verbal cues General Comments: Pt aware of his weakness today,  especially in LEs. Pt with flat affect today, states he is tired.      Exercises Other Exercises Other Exercises: Sit to stand x7 from EOB with use of  RW, emphasis on slow eccentric lowering and gluteal contraction and full upright standing.    General Comments        Pertinent Vitals/Pain Pain Assessment: No/denies pain    Home Living                      Prior Function            PT Goals (current goals can now be found in the care plan section)      Frequency    Min 4X/week      PT Plan Discharge plan needs to be updated    Co-evaluation              AM-PAC PT "6 Clicks" Mobility   Outcome Measure  Help needed turning from your back to your side while in a flat bed without using bedrails?: A Little Help needed moving from lying on your back to sitting on the side of a flat bed without using bedrails?: A Little Help needed moving to and from a bed to a chair (including a wheelchair)?: A Little Help needed standing up from a chair using your arms (e.g., wheelchair or bedside chair)?: A Little Help needed to walk in hospital room?: A Little Help needed climbing 3-5 steps with a railing? : A Lot 6 Click Score: 17    End of Session Equipment Utilized During Treatment: Gait belt Activity Tolerance: Patient tolerated treatment well;Patient limited by fatigue Patient left: with call bell/phone within reach;in bed;with bed alarm set Nurse Communication: Mobility status PT Visit Diagnosis: Other abnormalities of gait and mobility (R26.89);Unsteadiness on feet (R26.81)     Time: [id number] PT Time Calculation (min) (ACUTE ONLY): 26 min  Charges:  $Gait Training: 8-22 mins $Therapeutic Activity: 8-22 mins                     Clevie Prout E, PT Acute Rehabilitation Services Pager [phone]  Office [place] 03/25/2019, 1:01 PM

## 2019-03-25 NOTE — Anesthesia Preprocedure Evaluation (Deleted)
Anesthesia Evaluation    Reviewed: Allergy & Precautions, Patient's Chart, lab work & pertinent test results  History of Anesthesia Complications Negative for: history of anesthetic complications  Airway        Dental   Pulmonary Current Smoker and Patient abstained from smoking.,           Cardiovascular hypertension, Pt. on home beta blockers and Pt. on medications + CAD and + CABG     '21 Carotid US - Under 50% right ICAS, no left ICAS  '21 TTE - severely increased LV wall thickness. EF 50-55%. Grade II diastolic dysfunction (elevated filling pressure). LA and RA mildly dilated. No significant valve problems.  Beta-blocker being held during admission due to bradycardia   Neuro/Psych CVA negative psych ROS   GI/Hepatic negative GI ROS, Neg liver ROS,   Endo/Other  Hypothyroidism  Pre-DM   Renal/GU negative Renal ROS     Musculoskeletal negative musculoskeletal ROS (+)   Abdominal   Peds  Hematology  (+) anemia ,   Anesthesia Other Findings Covid neg 1/25   Reproductive/Obstetrics                             Anesthesia Physical Anesthesia Plan  ASA: III  Anesthesia Plan: General   Post-op Pain Management:    Induction: Intravenous  PONV Risk Score and Plan: 1 and Treatment may vary due to age or medical condition and Ondansetron  Airway Management Planned: Oral ETT  Additional Equipment: Arterial line  Intra-op Plan:   Post-operative Plan: Extubation in OR  Informed Consent:   Plan Discussed with: CRNA and Anesthesiologist  Anesthesia Plan Comments:         Anesthesia Quick Evaluation

## 2019-03-25 NOTE — Progress Notes (Addendum)
ANTICOAGULATION CONSULT NOTE - Follow Up Consult  Pharmacy Consult for heparin Indication: left atrial appendage thrombus  Labs: Recent Labs    03/23/19 0003 03/23/19 0738 03/24/19 0238 03/24/19 1548 03/25/19 0225  HGB 12.7*  --  11.4*  --  10.3*  HCT 37.5*  --  33.2*  --  30.0*  PLT 193  --  194  --  177  HEPARINUNFRC 0.73*   < > 0.15* 0.14* 0.30  CREATININE 1.43*  --  1.52*  --  1.22   < > = values in this interval not displayed.    Assessment/Plan:  69yo male therapeutic on heparin after rate changes. Will continue gtt at current rate.  Thank you Anette Guarneri, PharmD

## 2019-03-25 NOTE — Anesthesia Preprocedure Evaluation (Addendum)
Anesthesia Evaluation  Patient identified by MRN, date of birth, ID bandGeneral Assessment Comment: Awake, answers questions appropriately with yes/no verbalizations  Reviewed: Allergy & Precautions, NPO status , Patient's Chart, lab work & pertinent test results  History of Anesthesia Complications Negative for: history of anesthetic complications  Airway Mallampati: III  TM Distance: >3 FB Neck ROM: Full    Dental  (+) Dental Advisory Given, Edentulous Upper   Pulmonary Current Smoker and Patient abstained from smoking.,    Pulmonary exam normal        Cardiovascular hypertension, Pt. on home beta blockers and Pt. on medications + CAD and + CABG  Normal cardiovascular exam   '21 Carotid US - Under 50% right ICAS, no left ICAS  '21 TTE - severely increased LV wall thickness. EF 50-55%. Grade II diastolic dysfunction (elevated filling pressure). LA and RA mildly dilated. No significant valve problems.  Beta-blocker being held during admission due to bradycardia   Neuro/Psych CVA, Residual Symptoms negative psych ROS   GI/Hepatic negative GI ROS, Neg liver ROS,   Endo/Other  Hypothyroidism  Pre-DM   Renal/GU negative Renal ROS     Musculoskeletal negative musculoskeletal ROS (+)   Abdominal   Peds  Hematology  (+) anemia ,   Anesthesia Other Findings Covid neg 1/25   Reproductive/Obstetrics                           Anesthesia Physical  Anesthesia Plan  ASA: III  Anesthesia Plan: General   Post-op Pain Management:    Induction: Intravenous  PONV Risk Score and Plan: 1 and Treatment may vary due to age or medical condition and Ondansetron  Airway Management Planned: Oral ETT  Additional Equipment: Arterial line  Intra-op Plan:   Post-operative Plan: Post-operative intubation/ventilation  Informed Consent: I have reviewed the patients History and Physical, chart, labs and  discussed the procedure including the risks, benefits and alternatives for the proposed anesthesia with the patient or authorized representative who has indicated his/her understanding and acceptance.     Dental advisory given  Plan Discussed with: CRNA and Anesthesiologist  Anesthesia Plan Comments: (Will transfer IR suite intubated for second part of procedure. )      Anesthesia Quick Evaluation

## 2019-03-25 NOTE — Plan of Care (Signed)
  Problem: Education: Goal: Knowledge of General Education information will improve Description: Including pain rating scale, medication(s)/side effects and non-pharmacologic comfort measures Outcome: Progressing   Problem: Health Behavior/Discharge Planning: Goal: Ability to manage health-related needs will improve Outcome: Progressing   Problem: Clinical Measurements: Goal: Ability to maintain clinical measurements within normal limits will improve Outcome: Progressing Goal: Will remain free from infection Outcome: Progressing Goal: Diagnostic test results will improve Outcome: Progressing Goal: Respiratory complications will improve Outcome: Progressing Goal: Cardiovascular complication will be avoided Outcome: Progressing   Problem: Activity: Goal: Risk for activity intolerance will decrease Outcome: Progressing   Problem: Nutrition: Goal: Adequate nutrition will be maintained Outcome: Progressing   Problem: Coping: Goal: Level of anxiety will decrease Outcome: Progressing   Problem: Elimination: Goal: Will not experience complications related to bowel motility Outcome: Progressing Goal: Will not experience complications related to urinary retention Outcome: Progressing   Problem: Pain Managment: Goal: General experience of comfort will improve Outcome: Progressing   Problem: Skin Integrity: Goal: Risk for impaired skin integrity will decrease Outcome: Progressing   Problem: Education: Goal: Knowledge of disease or condition will improve Outcome: Progressing Goal: Knowledge of secondary prevention will improve Outcome: Progressing Goal: Knowledge of patient specific risk factors addressed and post discharge goals established will improve Outcome: Progressing Goal: Individualized Educational Video(s) Outcome: Progressing   Problem: Coping: Goal: Will verbalize positive feelings about self Outcome: Progressing Goal: Will identify appropriate support  needs Outcome: Progressing   Problem: Health Behavior/Discharge Planning: Goal: Ability to manage health-related needs will improve Outcome: Progressing   Problem: Self-Care: Goal: Ability to participate in self-care as condition permits will improve Outcome: Progressing Goal: Verbalization of feelings and concerns over difficulty with self-care will improve Outcome: Progressing Goal: Ability to communicate needs accurately will improve Outcome: Progressing   Problem: Nutrition: Goal: Risk of aspiration will decrease Outcome: Progressing Goal: Dietary intake will improve Outcome: Progressing   Problem: Ischemic Stroke/TIA Tissue Perfusion: Goal: Complications of ischemic stroke/TIA will be minimized Outcome: Progressing

## 2019-03-25 NOTE — Progress Notes (Signed)
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  Apparently presented to [facility] ER on 03/17/2019 for dizziness and syncope at the PCP office.  Patient was unresponsive, hypotensive and diaphoretic.  In ED bp 158/77,   CT brain  03/17/2019 8x82mm focal slight dense lesion in the left and anterior callosal region.    MRI brain 03/17/19 Early subacute infarct within the right cerebellum and right brachium pontis  Punctate acute left parietal lobe cortical infarct  9x50mm lobular focus of enhancement in the pericallosal region w imaging features most suggestive of ACA pericallosal aneurysm, CTA recommended.     Chronic lacunar infarct in the right caudate nucleus  CTA head/ neck 4x7 mm anuerysm left pericallosal segment left ACA without evidence of rupture.   Mild stenosis of right M1 segment,  Moderate to severe stenosis left M1 segment poststenotic dilation.  Mild stenoss in the PCA bilaterally  Atherosclerotic disease, mural thrombus in the prox left ICA narrowing the lumen by 50%.  Eccentric atherosclerotic dsiease vs focal dissection in the left ICA below the skull base without significant stenosis  TEE 03/20/19 EF 45-50% , left atrial appendage thrombus, and mild MR   Subjective   Patient seen and examined, required placement of coud Foley catheter yesterday for urinary retention.   Assessment/Plan:     1. M1 stenosis, left ACA aneurysm, mild L ICA stenosis-patient underwent cerebral arteriogram per neuro interventional neurology.  He has severe 90% left MCA M1 stenosis, 9.7 mm x 1 mm left ACA pericallosal aneurysm,  75% left ICA proximal stenosis.  Plan for intervention early next week as per Dr. Estanislado Pandy.  Patient is currently on Plavix 75 mg p.o. daily.  Also started on aspirin 81 mg daily per neurology.plan for cerebral arteriogram with possible stent placement of left MCA M1 stenosis and/or embolization of left ACA pericallosal aneurysm plan for 03/26/2019 by neuro intervention radiology.  2. 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 Sallyanne Kuster, he recommends starting anticoagulation immediately if okay with neuro intervention radiology.  I called and discussed with Dr. Estanislado Pandy, he is okay with starting anticoagulation.  Patient started on heparin.  Consider switching to Eliquis after the procedure.  3. Right foot ischemia-patient was found to have absent peripheral pulses in right foot, vascular surgery was consulted.  Dr. Eden Lathe has seen the patient and ordered duplex ultrasound of the right lower extremity with bilateral ABIs.  He has severe PAD, chronic.  Patient is asymptomatic at this time.  No intervention planned in the hospital.  Patient to follow-up as outpatient.  4. Bilateral stroke, right cerebellum, right brachium pontis-patient was taking aspirin 81 mg daily at home.  He has been started here on Plavix, and aspirin. Continue statin. Neurology following.  5. Syncope-multifactorial likely from polypharmacy causing bradycardia, hypotension, permissive hypertension.  Continue to monitor patient blood pressure in the hospital.  6. Bradycardia-likely from carvedilol which is on hold.  Patient also has hypothyroidism.  7. Hypothyroidism-TSH was significantly elevated 111 with T3 and T4 lower than normal.  On Synthroid 100 mcg daily.  Recheck TSH--45.8, T3 and T4--0.64.  8. Hypokalemia-replete  9. Abdominal mass-palpable mass noted in left lower quadrant/lower abdomen.  X-ray of  the abdomen showed distended urinary bladder.  Started on Flomax 0.4 mg p.o. daily.  Coud Foley catheter inserted yesterday.  Renal ultrasound shows no hydronephrosis.  I called and discussed with urologist on-call Dr. [name], who recommends to continue with Flomax at this time and consider voiding trial once patient is more stable.  If patient is unable to urinate, reinsert Foley catheter and follow-up urology in 2 weeks after discharge.    10. Acute kidney injury-patient baseline creatinine is around one.  Creatinine has jumped up to 1.52.  Started on Flomax as above.  Foley catheter inserted as above.  Started on gentle hydration with normal saline at 75 mill per hour.  Today creatinine is down to 1.22.  Will change IV fluids to [facility].  Follow BMP in a.m.      SpO2: 97 % O2 Flow Rate (L/min): 2 L/min      CBG: Recent Labs  Lab 03/24/19 2025 03/25/19 0011 03/25/19 0429 03/25/19 0810 03/25/19 1114  GLUCAP 120* 102* 104* 86 110*    CBC: Recent Labs  Lab 03/21/19 0413 03/22/19 0112 03/23/19 0003 03/24/19 0238 03/25/19 0225  WBC 4.7 5.2 5.7 4.5 4.8  HGB 13.4 12.7* 12.7* 11.4* 10.3*  HCT 39.4 37.7* 37.5* 33.2* 30.0*  MCV 93.6 93.8 93.5 92.7 92.9  PLT PLATELET CLUMPS NOTED ON SMEAR, UNABLE TO ESTIMATE 199 193 194 [id number]    Basic Metabolic Panel: Recent Labs  Lab 03/21/19 0413 03/22/19 0112 03/23/19 0003 03/24/19 0238 03/25/19 0225  NA 136 136 135 136 135  K 3.2* 3.3* 3.6 3.8 3.5  CL 98 101 102 102 104  CO2 25 23 22 22  21*  GLUCOSE 94 109* 108* 109* 106*  BUN 15 11 15 18 14   CREATININE 1.12 1.06 1.43* 1.52* 1.22  CALCIUM 8.9 9.0 8.9 8.5* 8.4*     Liver Function Tests: Recent Labs  Lab 03/21/19 0413 03/22/19 0112  AST 23 22  ALT 16 15  ALKPHOS 61 64  BILITOT 0.9 0.7  PROT 7.4 7.6  ALBUMIN 3.5 3.5      DVT prophylaxis: Heparin  Code Status: Full code  Family Communication: No family at bedside  Disposition Plan: likely home when medically  ready for discharge       Scheduled medications:  . aspirin EC  81 mg Oral Daily  . atorvastatin  80 mg Oral q1800  . Chlorhexidine Gluconate Cloth  6 each Topical Daily  . clopidogrel  75 mg Oral Daily  . insulin aspart  0-9 Units Subcutaneous Q4H  . levothyroxine  100 mcg Oral Q0600  . tamsulosin  0.4 mg Oral Daily    Consultants:  Neuro intervention radiology  Vascular surgery  Neurology  Procedures:  Cerebral arteriogram  Antibiotics:   Anti-infectives (From admission, onward)   Start     Dose/Rate Route Frequency Ordered Stop   03/25/19 1215  levofloxacin (LEVAQUIN) IVPB 750 mg     750 mg 100 mL/hr over 90 Minutes Intravenous Every 24 hours 03/25/19 1206 03/30/19 1214       Objective   Vitals:   03/25/19 0013 03/25/19 0308 03/25/19 0813 03/25/19 1118  BP: (!) 153/76 (!) 146/78 (!) 150/76 137/83  Pulse: 70 73 67 66  Resp: 19 18 20 16   Temp: 99.4 F (37.4 C) 100 F (37.8 C) 98.6 F (37 C) 98.4 F (36.9 C)  TempSrc: Oral Oral Oral Oral  SpO2: 95%  95% 97% 97%  Weight:      Height:        Intake/Output Summary (Last 24 hours) at 03/25/2019 1240 Last data filed at 03/25/2019 1119 Gross per 24 hour  Intake --  Output 2000 ml  Net -2000 ml    01/24 1901 - 01/26 0700 In: -  Out: 1350 [id number]  Filed Weights   03/20/19 1842  Weight: 106.1 kg    Physical Examination:    General-appears in no acute distress  Heart-S1-S2, regular, no murmur auscultated  Lungs-clear to auscultation bilaterally, no wheezing or crackles auscultated  Abdomen-soft, nontender, no organomegaly  Extremities-no edema in the lower extremities  Neuro-alert, oriented x3, no focal deficit noted     Studies:  US RENAL  Result Date: 04-21-19 CLINICAL DATA:  Elevated BUN and creatinine EXAM: RENAL / URINARY TRACT ULTRASOUND COMPLETE COMPARISON:  None. FINDINGS: Right Kidney: Renal measurements: 10.4 x 5.4 x 5.4 cm = volume: 158 mL. 1.4 cm midpole cyst.  Normal echotexture. No suspicious mass or hydronephrosis. Left Kidney: Renal measurements: 11.1 x 6.4 x 4.8 cm = volume: 179 mL. Echogenicity within normal limits. No mass or hydronephrosis visualized. Bladder: Decompressed with Foley catheter in place. Other: None. IMPRESSION: No acute findings.  No hydronephrosis. Electronically Signed   By: Rolm Baptise M.D.   On: 04/21/2019 21:58     Admission status: Inpatient: Based on patients clinical presentation and evaluation of above clinical data, I have made determination that patient meets Inpatient criteria at this time.   Oswald Hillock   [facility] If 7PM-7AM, please contact night-coverage at [email or web address], Office  [phone]  password [email or web address]  03/25/2019, 12:40 PM  LOS: 5 days

## 2019-03-26 ENCOUNTER — Encounter (HOSPITAL_COMMUNITY): Payer: Self-pay | Admitting: Anesthesiology

## 2019-03-26 ENCOUNTER — Inpatient Hospital Stay (HOSPITAL_COMMUNITY)
Admit: 2019-03-26 | Discharge: 2019-03-26 | Disposition: A | Discharge status: Home / Self Care | Payer: BC Managed Care – PPO | Attending: Interventional Radiology | Admitting: Interventional Radiology

## 2019-03-26 ENCOUNTER — Inpatient Hospital Stay (HOSPITAL_COMMUNITY): Payer: BC Managed Care – PPO

## 2019-03-26 ENCOUNTER — Encounter (HOSPITAL_COMMUNITY)
Admission: AD | Disposition: A | Discharge status: Rehabilitation Facility | Payer: Self-pay | Source: Other Acute Inpatient Hospital | Attending: Internal Medicine

## 2019-03-26 ENCOUNTER — Encounter (HOSPITAL_COMMUNITY): Payer: Self-pay | Admitting: Internal Medicine

## 2019-03-26 DIAGNOSIS — I6609 Occlusion and stenosis of unspecified middle cerebral artery: Secondary | ICD-10-CM | POA: Diagnosis present

## 2019-03-26 DIAGNOSIS — R55 Syncope and collapse: Secondary | ICD-10-CM

## 2019-03-26 HISTORY — PX: IR INTRA CRAN STENT: IMG2345

## 2019-03-26 HISTORY — PX: IR TRANSCATH/EMBOLIZ: IMG695

## 2019-03-26 HISTORY — PX: IR INTRAVSC STENT CERV CAROTID W/O EMB-PROT MOD SED INC ANGIO: IMG2304

## 2019-03-26 HISTORY — PX: IR ANGIOGRAM FOLLOW UP STUDY: IMG697

## 2019-03-26 HISTORY — PX: IR ANGIO INTRA EXTRACRAN SEL INTERNAL CAROTID UNI L MOD SED: IMG5361

## 2019-03-26 HISTORY — PX: IR CT HEAD LTD: IMG2386

## 2019-03-26 HISTORY — PX: ENDARTERECTOMY: SHX5162

## 2019-03-26 HISTORY — PX: IR NEURO EACH ADD'L AFTER BASIC UNI LEFT (MS): IMG5373

## 2019-03-26 LAB — POCT I-STAT 7, (LYTES, BLD GAS, ICA,H+H)
Bicarbonate: 24.8 mmol/L (ref 20.0–28.0)
Calcium, Ion: 1.13 mmol/L — ABNORMAL LOW (ref 1.15–1.40)
HCT: 24 % — ABNORMAL LOW (ref 39.0–52.0)
Hemoglobin: 8.2 g/dL — ABNORMAL LOW (ref 13.0–17.0)
O2 Saturation: 100 %
Patient temperature: 97.6
Potassium: 3.8 mmol/L (ref 3.5–5.1)
Sodium: 136 mmol/L (ref 135–145)
TCO2: 26 mmol/L (ref 22–32)
pCO2 arterial: 41.2 mmHg (ref 32.0–48.0)
pH, Arterial: 7.385 (ref 7.350–7.450)
pO2, Arterial: 386 mmHg — ABNORMAL HIGH (ref 83.0–108.0)

## 2019-03-26 LAB — POCT I-STAT, CHEM 8
BUN: 10 mg/dL (ref 8–23)
BUN: 10 mg/dL (ref 8–23)
Calcium, Ion: 1.14 mmol/L — ABNORMAL LOW (ref 1.15–1.40)
Calcium, Ion: 1.14 mmol/L — ABNORMAL LOW (ref 1.15–1.40)
Chloride: 105 mmol/L (ref 98–111)
Chloride: 104 mmol/L (ref 98–111)
Creatinine, Ser: 1 mg/dL (ref 0.61–1.24)
Creatinine, Ser: 1 mg/dL (ref 0.61–1.24)
Glucose, Bld: 96 mg/dL (ref 70–99)
Glucose, Bld: 136 mg/dL — ABNORMAL HIGH (ref 70–99)
HCT: 27 % — ABNORMAL LOW (ref 39.0–52.0)
HCT: 24 % — ABNORMAL LOW (ref 39.0–52.0)
Hemoglobin: 9.2 g/dL — ABNORMAL LOW (ref 13.0–17.0)
Hemoglobin: 8.2 g/dL — ABNORMAL LOW (ref 13.0–17.0)
Potassium: 3.4 mmol/L — ABNORMAL LOW (ref 3.5–5.1)
Potassium: 3.9 mmol/L (ref 3.5–5.1)
Sodium: 138 mmol/L (ref 135–145)
Sodium: 136 mmol/L (ref 135–145)
TCO2: 23 mmol/L (ref 22–32)
TCO2: 24 mmol/L (ref 22–32)

## 2019-03-26 LAB — CBC
HCT: 28.7 % — ABNORMAL LOW (ref 39.0–52.0)
Hemoglobin: 9.6 g/dL — ABNORMAL LOW (ref 13.0–17.0)
MCH: 31.6 pg (ref 26.0–34.0)
MCHC: 33.4 g/dL (ref 30.0–36.0)
MCV: 94.4 fL (ref 80.0–100.0)
Platelets: 195 10*3/uL (ref 150–400)
RBC: 3.04 MIL/uL — ABNORMAL LOW (ref 4.22–5.81)
RDW: 15.1 % (ref 11.5–15.5)
WBC: 4.7 10*3/uL (ref 4.0–10.5)
nRBC: 0 % (ref 0.0–0.2)

## 2019-03-26 LAB — ABO/RH: ABO/RH(D): O POS

## 2019-03-26 LAB — GLUCOSE, CAPILLARY
Glucose-Capillary: 98 mg/dL (ref 70–99)
Glucose-Capillary: 116 mg/dL — ABNORMAL HIGH (ref 70–99)
Glucose-Capillary: 89 mg/dL (ref 70–99)

## 2019-03-26 LAB — BASIC METABOLIC PANEL
Anion gap: 9 (ref 5–15)
BUN: 11 mg/dL (ref 8–23)
CO2: 22 mmol/L (ref 22–32)
Calcium: 8.4 mg/dL — ABNORMAL LOW (ref 8.9–10.3)
Chloride: 103 mmol/L (ref 98–111)
Creatinine, Ser: 1.19 mg/dL (ref 0.61–1.24)
GFR calc Af Amer: >60 mL/min (ref >60)
GFR calc non Af Amer: >60 mL/min (ref >60)
Glucose, Bld: 105 mg/dL — ABNORMAL HIGH (ref 70–99)
Potassium: 3.4 mmol/L — ABNORMAL LOW (ref 3.5–5.1)
Sodium: 134 mmol/L — ABNORMAL LOW (ref 135–145)

## 2019-03-26 LAB — POCT ACTIVATED CLOTTING TIME
Activated Clotting Time: 235 seconds
Activated Clotting Time: 235 seconds
Activated Clotting Time: 246 seconds

## 2019-03-26 LAB — SURGICAL PCR SCREEN
MRSA, PCR: NEGATIVE
Staphylococcus aureus: NEGATIVE

## 2019-03-26 LAB — PLATELET INHIBITION P2Y12
Platelet Function  P2Y12: 206 [PRU] (ref 182–335)
Platelet Function  P2Y12: 255 [PRU] (ref 182–335)

## 2019-03-26 LAB — PREPARE RBC (CROSSMATCH)

## 2019-03-26 LAB — HEPARIN LEVEL (UNFRACTIONATED): Heparin Unfractionated: 0.27 IU/mL — ABNORMAL LOW (ref 0.30–0.70)

## 2019-03-26 SURGERY — IR WITH ANESTHESIA
Anesthesia: General

## 2019-03-26 SURGERY — ENDARTERECTOMY, CAROTID
Anesthesia: General | Site: Neck | Laterality: Left

## 2019-03-26 MED ORDER — IOHEXOL 350 MG/ML SOLN
100.0000 mL | Freq: Once | INTRAVENOUS | Status: AC | PRN
  Administered 2019-03-26: 100 mL via INTRAVENOUS

## 2019-03-26 MED ORDER — ORAL CARE MOUTH RINSE
15.0000 mL | OROMUCOSAL | Status: DC
  Administered 2019-03-26 – 2019-03-27 (×10): 15 mL via OROMUCOSAL

## 2019-03-26 MED ORDER — LIDOCAINE HCL (PF) 1 % IJ SOLN
INTRAMUSCULAR | Status: AC
  Filled 2019-03-26: qty 30

## 2019-03-26 MED ORDER — SODIUM CHLORIDE 0.9 % IV BOLUS
500.0000 mL | Freq: Once | INTRAVENOUS | Status: AC
  Administered 2019-03-26: 18:00:00 500 mL via INTRAVENOUS

## 2019-03-26 MED ORDER — EPTIFIBATIDE 20 MG/10ML IV SOLN
INTRAVENOUS | Status: AC | PRN
  Administered 2019-03-26 (×8): 1.5 mg

## 2019-03-26 MED ORDER — ONDANSETRON HCL 4 MG/2ML IJ SOLN
INTRAMUSCULAR | Status: AC
  Filled 2019-03-26: qty 2

## 2019-03-26 MED ORDER — ACETAMINOPHEN 160 MG/5ML PO SOLN: 650.0000 mg | ORAL | Status: DC | PRN

## 2019-03-26 MED ORDER — CEFAZOLIN SODIUM 1 G IJ SOLR
INTRAMUSCULAR | Status: AC
  Filled 2019-03-26: qty 20

## 2019-03-26 MED ORDER — IOHEXOL 300 MG/ML  SOLN
150.0000 mL | Freq: Once | INTRAMUSCULAR | Status: AC | PRN
  Administered 2019-03-26: 75 mL via INTRA_ARTERIAL

## 2019-03-26 MED ORDER — PROPOFOL 1000 MG/100ML IV EMUL
0.0000 ug/kg/min | INTRAVENOUS | Status: DC
  Administered 2019-03-26: 17:00:00 40 ug/kg/min via INTRAVENOUS
  Administered 2019-03-26 (×2): 30 ug/kg/min via INTRAVENOUS
  Administered 2019-03-27: 10:00:00 15 ug/kg/min via INTRAVENOUS
  Administered 2019-03-27: 25 ug/kg/min via INTRAVENOUS
  Filled 2019-03-26 (×4): qty 100

## 2019-03-26 MED ORDER — 0.9 % SODIUM CHLORIDE (POUR BTL) OPTIME
TOPICAL | Status: DC | PRN
  Administered 2019-03-26: 2000 mL

## 2019-03-26 MED ORDER — LABETALOL HCL 5 MG/ML IV SOLN
INTRAVENOUS | Status: AC
  Filled 2019-03-26: qty 4

## 2019-03-26 MED ORDER — EPHEDRINE SULFATE-NACL 50-0.9 MG/10ML-% IV SOSY
PREFILLED_SYRINGE | INTRAVENOUS | Status: DC | PRN
  Administered 2019-03-26 (×2): 5 mg via INTRAVENOUS

## 2019-03-26 MED ORDER — PHENYLEPHRINE 40 MCG/ML (10ML) SYRINGE FOR IV PUSH (FOR BLOOD PRESSURE SUPPORT)
PREFILLED_SYRINGE | INTRAVENOUS | Status: DC | PRN
  Administered 2019-03-26 (×4): 40 ug via INTRAVENOUS
  Administered 2019-03-26 (×2): 80 ug via INTRAVENOUS
  Administered 2019-03-26: 40 ug via INTRAVENOUS
  Administered 2019-03-26: 80 ug via INTRAVENOUS
  Administered 2019-03-26: 40 ug via INTRAVENOUS

## 2019-03-26 MED ORDER — LACTATED RINGERS IV SOLN: INTRAVENOUS | Status: DC | PRN

## 2019-03-26 MED ORDER — PROPOFOL 10 MG/ML IV BOLUS
INTRAVENOUS | Status: DC | PRN
  Administered 2019-03-26: 130 mg via INTRAVENOUS
  Administered 2019-03-26: 20 mg via INTRAVENOUS

## 2019-03-26 MED ORDER — 0.9 % SODIUM CHLORIDE (POUR BTL) OPTIME
TOPICAL | Status: DC | PRN
  Administered 2019-03-26 (×2): 1000 mL

## 2019-03-26 MED ORDER — HEPARIN (PORCINE) 25000 UT/250ML-% IV SOLN: 500.0000 [IU]/h | INTRAVENOUS | Status: DC

## 2019-03-26 MED ORDER — FENTANYL CITRATE (PF) 250 MCG/5ML IJ SOLN
INTRAMUSCULAR | Status: DC | PRN
  Administered 2019-03-26 (×3): 50 ug via INTRAVENOUS

## 2019-03-26 MED ORDER — EPHEDRINE SULFATE 50 MG/ML IJ SOLN: INTRAMUSCULAR | Status: DC | PRN

## 2019-03-26 MED ORDER — SODIUM CHLORIDE 0.9% IV SOLUTION: Freq: Once | INTRAVENOUS | Status: DC

## 2019-03-26 MED ORDER — SODIUM CHLORIDE 0.9 % IV SOLN: INTRAVENOUS | Status: DC

## 2019-03-26 MED ORDER — FENTANYL CITRATE (PF) 100 MCG/2ML IJ SOLN
25.0000 ug | INTRAMUSCULAR | Status: DC | PRN
  Administered 2019-03-26 – 2019-03-27 (×2): 100 ug via INTRAVENOUS
  Filled 2019-03-26 (×2): qty 2

## 2019-03-26 MED ORDER — CHLORHEXIDINE GLUCONATE 0.12% ORAL RINSE (MEDLINE KIT)
15.0000 mL | Freq: Two times a day (BID) | OROMUCOSAL | Status: DC
  Administered 2019-03-26 – 2019-04-03 (×14): 15 mL via OROMUCOSAL

## 2019-03-26 MED ORDER — SODIUM CHLORIDE 0.9 % IV SOLN
INTRAVENOUS | Status: DC | PRN
  Administered 2019-03-26: 500 mL

## 2019-03-26 MED ORDER — HEPARIN SODIUM (PORCINE) 1000 UNIT/ML IJ SOLN
INTRAMUSCULAR | Status: DC | PRN
  Administered 2019-03-26: 10000 [IU] via INTRAVENOUS
  Administered 2019-03-26: 1000 [IU] via INTRAVENOUS

## 2019-03-26 MED ORDER — HYDRALAZINE HCL 20 MG/ML IJ SOLN
INTRAMUSCULAR | Status: AC
  Filled 2019-03-26: qty 1

## 2019-03-26 MED ORDER — FENTANYL CITRATE (PF) 100 MCG/2ML IJ SOLN: 25.0000 ug | INTRAMUSCULAR | Status: DC | PRN

## 2019-03-26 MED ORDER — ROCURONIUM BROMIDE 10 MG/ML (PF) SYRINGE
PREFILLED_SYRINGE | INTRAVENOUS | Status: DC | PRN
  Administered 2019-03-26 (×2): 50 mg via INTRAVENOUS
  Administered 2019-03-26: 100 mg via INTRAVENOUS
  Administered 2019-03-26: 50 mg via INTRAVENOUS

## 2019-03-26 MED ORDER — LEVOTHYROXINE SODIUM 100 MCG PO TABS
100.0000 ug | ORAL_TABLET | Freq: Every day | ORAL | Status: DC
  Administered 2019-03-27 – 2019-03-29 (×3): 100 ug
  Filled 2019-03-26 (×4): qty 1

## 2019-03-26 MED ORDER — ACETAMINOPHEN 650 MG RE SUPP: 650.0000 mg | RECTAL | Status: DC | PRN

## 2019-03-26 MED ORDER — CHLORHEXIDINE GLUCONATE 0.12% ORAL RINSE (MEDLINE KIT): 15.0000 mL | Freq: Two times a day (BID) | OROMUCOSAL | Status: DC

## 2019-03-26 MED ORDER — PROPOFOL 500 MG/50ML IV EMUL
INTRAVENOUS | Status: DC | PRN
  Administered 2019-03-26: 100 ug/kg/min via INTRAVENOUS

## 2019-03-26 MED ORDER — CHLORHEXIDINE GLUCONATE 4 % EX LIQD
CUTANEOUS | Status: AC
  Filled 2019-03-26: qty 15

## 2019-03-26 MED ORDER — PROTAMINE SULFATE 10 MG/ML IV SOLN
INTRAVENOUS | Status: AC
  Filled 2019-03-26: qty 5

## 2019-03-26 MED ORDER — SODIUM CHLORIDE 0.9 % IV SOLN
INTRAVENOUS | Status: AC
  Filled 2019-03-26: qty 1.2

## 2019-03-26 MED ORDER — HEPARIN (PORCINE) 25000 UT/250ML-% IV SOLN
750.0000 [IU]/h | INTRAVENOUS | Status: DC
  Filled 2019-03-26: qty 250

## 2019-03-26 MED ORDER — ORAL CARE MOUTH RINSE: 15.0000 mL | OROMUCOSAL | Status: DC

## 2019-03-26 MED ORDER — HEPARIN (PORCINE) 25000 UT/250ML-% IV SOLN
750.0000 [IU]/h | INTRAVENOUS | Status: DC
  Administered 2019-03-26: 20:00:00 750 [IU]/h via INTRAVENOUS

## 2019-03-26 MED ORDER — FENTANYL CITRATE (PF) 250 MCG/5ML IJ SOLN
INTRAMUSCULAR | Status: AC
  Filled 2019-03-26: qty 5

## 2019-03-26 MED ORDER — PHENYLEPHRINE HCL-NACL 10-0.9 MG/250ML-% IV SOLN
25.0000 ug/min | INTRAVENOUS | Status: DC
  Administered 2019-03-26: 17:00:00 40 ug/min via INTRAVENOUS
  Filled 2019-03-26: qty 250

## 2019-03-26 MED ORDER — ASPIRIN 81 MG PO CHEW
81.0000 mg | CHEWABLE_TABLET | Freq: Every day | ORAL | Status: DC
  Filled 2019-03-26: qty 1

## 2019-03-26 MED ORDER — CEFAZOLIN SODIUM-DEXTROSE 2-3 GM-%(50ML) IV SOLR
INTRAVENOUS | Status: DC | PRN
  Administered 2019-03-26 (×2): 2 g via INTRAVENOUS

## 2019-03-26 MED ORDER — ACETAMINOPHEN 325 MG PO TABS: 650.0000 mg | ORAL_TABLET | ORAL | Status: DC | PRN

## 2019-03-26 MED ORDER — EPTIFIBATIDE 20 MG/10ML IV SOLN
INTRAVENOUS | Status: AC
  Filled 2019-03-26: qty 10

## 2019-03-26 MED ORDER — PANTOPRAZOLE SODIUM 40 MG IV SOLR
40.0000 mg | Freq: Every day | INTRAVENOUS | Status: DC
  Administered 2019-03-26 – 2019-03-27 (×2): 40 mg via INTRAVENOUS
  Filled 2019-03-26 (×2): qty 40

## 2019-03-26 MED ORDER — IODIXANOL 320 MG/ML IV SOLN
INTRAVENOUS | Status: DC | PRN
  Administered 2019-03-26: 10 mL via INTRA_ARTERIAL

## 2019-03-26 MED ORDER — PROPOFOL 1000 MG/100ML IV EMUL
INTRAVENOUS | Status: AC
  Filled 2019-03-26: qty 100

## 2019-03-26 MED ORDER — ATORVASTATIN CALCIUM 80 MG PO TABS
80.0000 mg | ORAL_TABLET | Freq: Every day | ORAL | Status: DC
  Administered 2019-03-26 – 2019-03-28 (×2): 80 mg
  Filled 2019-03-26 (×2): qty 1

## 2019-03-26 MED ORDER — HEMOSTATIC AGENTS (NO CHARGE) OPTIME
TOPICAL | Status: DC | PRN
  Administered 2019-03-26: 1 via TOPICAL

## 2019-03-26 MED ORDER — ONDANSETRON HCL 4 MG/2ML IJ SOLN: 4.0000 mg | Freq: Four times a day (QID) | INTRAMUSCULAR | Status: DC | PRN

## 2019-03-26 MED ORDER — PHENYLEPHRINE HCL-NACL 10-0.9 MG/250ML-% IV SOLN
INTRAVENOUS | Status: DC | PRN
  Administered 2019-03-26: 25 ug/min via INTRAVENOUS
  Administered 2019-03-26: 30 ug/min via INTRAVENOUS

## 2019-03-26 MED ORDER — CLEVIDIPINE BUTYRATE 0.5 MG/ML IV EMUL
0.0000 mg/h | INTRAVENOUS | Status: DC
  Administered 2019-03-27: 4 mg/h via INTRAVENOUS
  Administered 2019-03-27: 06:00:00 5 mg/h via INTRAVENOUS
  Administered 2019-03-27: 01:00:00 2 mg/h via INTRAVENOUS
  Administered 2019-03-27 (×2): 8 mg/h via INTRAVENOUS
  Filled 2019-03-26 (×6): qty 50
  Filled 2019-03-26: qty 100

## 2019-03-26 MED ORDER — MIDAZOLAM HCL 2 MG/2ML IJ SOLN
INTRAMUSCULAR | Status: AC
  Filled 2019-03-26: qty 2

## 2019-03-26 MED ORDER — PROPOFOL 10 MG/ML IV BOLUS
INTRAVENOUS | Status: AC
  Filled 2019-03-26: qty 40

## 2019-03-26 MED ORDER — TICAGRELOR 90 MG PO TABS: 90.0000 mg | ORAL_TABLET | Freq: Two times a day (BID) | ORAL | Status: DC

## 2019-03-26 MED ORDER — CLOPIDOGREL BISULFATE 75 MG PO TABS: 225.0000 mg | ORAL_TABLET | Freq: Once | ORAL | Status: AC

## 2019-03-26 MED ORDER — LIDOCAINE 2% (20 MG/ML) 5 ML SYRINGE
INTRAMUSCULAR | Status: DC | PRN
  Administered 2019-03-26: 60 mg via INTRAVENOUS

## 2019-03-26 MED ORDER — SODIUM CHLORIDE 0.9 % IV SOLN: INTRAVENOUS | Status: DC | PRN

## 2019-03-26 MED ORDER — CLOPIDOGREL BISULFATE 75 MG PO TABS
ORAL_TABLET | ORAL | Status: AC
  Administered 2019-03-26: 08:00:00 225 mg via ORAL
  Filled 2019-03-26: qty 3

## 2019-03-26 MED ORDER — TICAGRELOR 90 MG PO TABS
180.0000 mg | ORAL_TABLET | Freq: Once | ORAL | Status: AC
  Administered 2019-03-27: 09:00:00 180 mg via ORAL
  Filled 2019-03-26: qty 2

## 2019-03-26 MED ORDER — SODIUM CHLORIDE 0.9 % IV SOLN: 250.0000 mL | INTRAVENOUS | Status: DC

## 2019-03-26 MED ORDER — PROTAMINE SULFATE 10 MG/ML IV SOLN
INTRAVENOUS | Status: DC | PRN
  Administered 2019-03-26: 50 mg via INTRAVENOUS

## 2019-03-26 MED ORDER — CEFAZOLIN SODIUM-DEXTROSE 2-4 GM/100ML-% IV SOLN
INTRAVENOUS | Status: AC
  Filled 2019-03-26: qty 100

## 2019-03-26 MED ORDER — CLOPIDOGREL BISULFATE 75 MG PO TABS: 75.0000 mg | ORAL_TABLET | Freq: Every day | ORAL | Status: DC

## 2019-03-26 MED ORDER — DEXAMETHASONE SODIUM PHOSPHATE 10 MG/ML IJ SOLN
INTRAMUSCULAR | Status: DC | PRN
  Administered 2019-03-26: 5 mg via INTRAVENOUS

## 2019-03-26 SURGICAL SUPPLY — 60 items
CANISTER SUCT 3000ML PPV (MISCELLANEOUS) ×3 IMPLANT
CATH ROBINSON RED A/P 18FR (CATHETERS) ×3 IMPLANT
CLIP VESOCCLUDE MED 24/CT (CLIP) IMPLANT
CLIP VESOCCLUDE MED 6/CT (CLIP) ×3 IMPLANT
CLIP VESOCCLUDE SM WIDE 24/CT (CLIP) IMPLANT
CLIP VESOCCLUDE SM WIDE 6/CT (CLIP) ×3 IMPLANT
COVER PROBE W GEL 5X96 (DRAPES) IMPLANT
COVER SURGICAL LIGHT HANDLE (MISCELLANEOUS) ×3 IMPLANT
COVER TRANSDUCER ULTRASND GEL (DRAPE) ×3 IMPLANT
COVER WAND RF STERILE (DRAPES) ×3 IMPLANT
DERMABOND ADVANCED (GAUZE/BANDAGES/DRESSINGS) ×2
DERMABOND ADVANCED .7 DNX12 (GAUZE/BANDAGES/DRESSINGS) ×1 IMPLANT
DRAIN CHANNEL 15F RND FF W/TCR (WOUND CARE) IMPLANT
DRAIN WOUND SNY 15 RND (WOUND CARE) ×3 IMPLANT
ELECT REM PT RETURN 9FT ADLT (ELECTROSURGICAL) ×3
ELECTRODE REM PT RTRN 9FT ADLT (ELECTROSURGICAL) ×1 IMPLANT
EVACUATOR SILICONE 100CC (DRAIN) ×3 IMPLANT
GAUZE SPONGE 2X2 8PLY STRL LF (GAUZE/BANDAGES/DRESSINGS) ×1 IMPLANT
GAUZE SPONGE 4X4 12PLY STRL LF (GAUZE/BANDAGES/DRESSINGS) ×3 IMPLANT
GLOVE BIO SURGEON STRL SZ 6.5 (GLOVE) ×2 IMPLANT
GLOVE BIO SURGEON STRL SZ7.5 (GLOVE) ×3 IMPLANT
GLOVE BIO SURGEONS STRL SZ 6.5 (GLOVE) ×1
GLOVE BIOGEL PI IND STRL 8 (GLOVE) ×1 IMPLANT
GLOVE BIOGEL PI INDICATOR 8 (GLOVE) ×2
GLOVE ECLIPSE 7.0 STRL STRAW (GLOVE) ×3 IMPLANT
GOWN STRL REUS W/ TWL LRG LVL3 (GOWN DISPOSABLE) ×2 IMPLANT
GOWN STRL REUS W/ TWL XL LVL3 (GOWN DISPOSABLE) ×2 IMPLANT
GOWN STRL REUS W/TWL LRG LVL3 (GOWN DISPOSABLE) ×4
GOWN STRL REUS W/TWL XL LVL3 (GOWN DISPOSABLE) ×4
HEMOSTAT SNOW SURGICEL 2X4 (HEMOSTASIS) ×3 IMPLANT
IV ADAPTER SYR DOUBLE MALE LL (MISCELLANEOUS) IMPLANT
KIT BASIN OR (CUSTOM PROCEDURE TRAY) ×3 IMPLANT
KIT SHUNT ARGYLE CAROTID ART 6 (VASCULAR PRODUCTS) IMPLANT
KIT TURNOVER KIT B (KITS) ×3 IMPLANT
LOOP VESSEL MINI RED (MISCELLANEOUS) IMPLANT
NEEDLE HYPO 25GX1X1/2 BEV (NEEDLE) IMPLANT
NEEDLE SPNL 20GX3.5 QUINCKE YW (NEEDLE) IMPLANT
NS IRRIG 1000ML POUR BTL (IV SOLUTION) ×9 IMPLANT
PACK CAROTID (CUSTOM PROCEDURE TRAY) ×3 IMPLANT
PAD ARMBOARD 7.5X6 YLW CONV (MISCELLANEOUS) ×6 IMPLANT
POSITIONER HEAD DONUT 9IN (MISCELLANEOUS) ×3 IMPLANT
SHUNT CAROTID BYPASS 10 (VASCULAR PRODUCTS) IMPLANT
SHUNT CAROTID BYPASS 12FRX15.5 (VASCULAR PRODUCTS) IMPLANT
SPONGE GAUZE 2X2 STER 10/PKG (GAUZE/BANDAGES/DRESSINGS) ×2
SPONGE SURGIFOAM ABS GEL 100 (HEMOSTASIS) IMPLANT
STOPCOCK 4 WAY LG BORE MALE ST (IV SETS) IMPLANT
SUT ETHILON 2 0 FS 18 (SUTURE) ×3 IMPLANT
SUT ETHILON 3 0 PS 1 (SUTURE) IMPLANT
SUT MNCRL AB 4-0 PS2 18 (SUTURE) ×3 IMPLANT
SUT PROLENE 5 0 C 1 24 (SUTURE) IMPLANT
SUT PROLENE 6 0 BV (SUTURE) ×3 IMPLANT
SUT SILK 3 0 (SUTURE)
SUT SILK 3-0 18XBRD TIE 12 (SUTURE) IMPLANT
SUT VIC AB 3-0 SH 27 (SUTURE) ×2
SUT VIC AB 3-0 SH 27X BRD (SUTURE) ×1 IMPLANT
SYR CONTROL 10ML LL (SYRINGE) IMPLANT
TAPE CLOTH SURG 4X10 WHT LF (GAUZE/BANDAGES/DRESSINGS) ×3 IMPLANT
TOWEL GREEN STERILE (TOWEL DISPOSABLE) ×3 IMPLANT
TUBING ART PRESS 48 MALE/FEM (TUBING) IMPLANT
WATER STERILE IRR 1000ML POUR (IV SOLUTION) ×3 IMPLANT

## 2019-03-26 SURGICAL SUPPLY — 54 items
CANISTER SUCT 3000ML PPV (MISCELLANEOUS) ×3 IMPLANT
CATH ROBINSON RED A/P 18FR (CATHETERS) ×3 IMPLANT
CLIP VESOCCLUDE MED 24/CT (CLIP) ×3 IMPLANT
CLIP VESOCCLUDE SM WIDE 24/CT (CLIP) ×3 IMPLANT
COVER PROBE W GEL 5X96 (DRAPES) IMPLANT
COVER TRANSDUCER ULTRASND GEL (DRAPE) ×3 IMPLANT
COVER WAND RF STERILE (DRAPES) ×3 IMPLANT
DERMABOND ADVANCED (GAUZE/BANDAGES/DRESSINGS) ×2
DERMABOND ADVANCED .7 DNX12 (GAUZE/BANDAGES/DRESSINGS) ×1 IMPLANT
DRAIN CHANNEL 15F RND FF W/TCR (WOUND CARE) IMPLANT
DRAPE INCISE IOBAN 66X45 STRL (DRAPES) ×3 IMPLANT
ELECT REM PT RETURN 9FT ADLT (ELECTROSURGICAL) ×3
ELECTRODE REM PT RTRN 9FT ADLT (ELECTROSURGICAL) ×1 IMPLANT
EVACUATOR SILICONE 100CC (DRAIN) IMPLANT
GLOVE BIO SURGEON STRL SZ7.5 (GLOVE) ×3 IMPLANT
GLOVE BIOGEL PI IND STRL 8 (GLOVE) ×1 IMPLANT
GLOVE BIOGEL PI INDICATOR 8 (GLOVE) ×2
GOWN STRL REUS W/ TWL LRG LVL3 (GOWN DISPOSABLE) ×2 IMPLANT
GOWN STRL REUS W/ TWL XL LVL3 (GOWN DISPOSABLE) ×2 IMPLANT
GOWN STRL REUS W/TWL LRG LVL3 (GOWN DISPOSABLE) ×4
GOWN STRL REUS W/TWL XL LVL3 (GOWN DISPOSABLE) ×4
HEMOSTAT SNOW SURGICEL 2X4 (HEMOSTASIS) IMPLANT
IV ADAPTER SYR DOUBLE MALE LL (MISCELLANEOUS) IMPLANT
KIT BASIN OR (CUSTOM PROCEDURE TRAY) ×3 IMPLANT
KIT SHUNT ARGYLE CAROTID ART 6 (VASCULAR PRODUCTS) IMPLANT
KIT TURNOVER KIT B (KITS) ×3 IMPLANT
LOOP VESSEL MINI RED (MISCELLANEOUS) IMPLANT
NEEDLE HYPO 25GX1X1/2 BEV (NEEDLE) IMPLANT
NEEDLE SPNL 20GX3.5 QUINCKE YW (NEEDLE) IMPLANT
NS IRRIG 1000ML POUR BTL (IV SOLUTION) ×9 IMPLANT
PACK CAROTID (CUSTOM PROCEDURE TRAY) ×3 IMPLANT
PAD ARMBOARD 7.5X6 YLW CONV (MISCELLANEOUS) ×6 IMPLANT
POSITIONER HEAD DONUT 9IN (MISCELLANEOUS) ×3 IMPLANT
SET MICROPUNCTURE 5F STIFF (MISCELLANEOUS) ×3 IMPLANT
SHEATH PINNACLE 6F 10CM (SHEATH) ×3 IMPLANT
SHUNT CAROTID BYPASS 10 (VASCULAR PRODUCTS) IMPLANT
SHUNT CAROTID BYPASS 12FRX15.5 (VASCULAR PRODUCTS) IMPLANT
SPONGE SURGIFOAM ABS GEL 100 (HEMOSTASIS) IMPLANT
STOPCOCK 4 WAY LG BORE MALE ST (IV SETS) ×3 IMPLANT
SUT ETHILON 2 0 FS 18 (SUTURE) ×3 IMPLANT
SUT ETHILON 3 0 PS 1 (SUTURE) IMPLANT
SUT MNCRL AB 4-0 PS2 18 (SUTURE) ×3 IMPLANT
SUT PROLENE 5 0 C 1 24 (SUTURE) ×3 IMPLANT
SUT PROLENE 6 0 BV (SUTURE) ×3 IMPLANT
SUT SILK 3 0 (SUTURE)
SUT SILK 3-0 18XBRD TIE 12 (SUTURE) IMPLANT
SUT VIC AB 3-0 SH 27 (SUTURE) ×2
SUT VIC AB 3-0 SH 27X BRD (SUTURE) ×1 IMPLANT
SYR CONTROL 10ML LL (SYRINGE) IMPLANT
TOWEL GREEN STERILE (TOWEL DISPOSABLE) ×3 IMPLANT
TUBING ART PRESS 48 MALE/FEM (TUBING) IMPLANT
TUBING EXTENTION W/L.L. (IV SETS) ×3 IMPLANT
WATER STERILE IRR 1000ML POUR (IV SOLUTION) ×3 IMPLANT
WIRE EMERALD 3MM-J .035X150CM (WIRE) ×3 IMPLANT

## 2019-03-26 NOTE — Progress Notes (Signed)
PT Cancellation Note  Patient Details Name: Jeremy Sherman MRN: [id number] DOB: 01-23-1951   Cancelled Treatment:    Reason Eval/Treat Not Completed: Patient at procedure or test/unavailable   Ellamae Sia, PT, DPT Acute Rehabilitation Services Pager [phone] Office [phone]    Deno Etienne 03/26/2019, 10:55 AM

## 2019-03-26 NOTE — Procedures (Signed)
S/P Lt common carotid arteriogram followed by Stent angioplasty of Lt MCA M 1 seg ,Stent assisted coiling of LT ACA pericallosal aneurysm and Lt ICA prox due  To sig stenosis precluding safe passage of guide catheter.  Post procedure CT brain ned for hemorrhage or mass effect. S.Makenize Messman MD

## 2019-03-26 NOTE — Progress Notes (Addendum)
Patient ID: Jeremy Sherman, male   DOB: [birth year], 69 y.o.   MRN: [id number] INR. Patient scheduled to undergo Lt ACA pericallosal region large probably dissecting aneurysm endovascular treatment and of the Lt MCA M1 symptomatic severe stenosis with angioplasty and/or  stenting under GA. Approach will be a via  direct  Lt CCA  Access provided by vascular surgery. Common femoral and radial access not feasible and safe due to severe peripheral vascular disease. The treatment plan ,reasons and alternatives were discussed fully in detail with the patient and his spouse. Risks of ischemic stroke,aneurysm rupture,worsening neuro function ,death and inability to treat were discussed fully also.   They both expressed understanding and provided consent to proceed. Plan for treatment also reviewed with stroke neurology.  Clinically stable with occasional word finding difficulties.No sig motor abnormalities. Speech and comprehension remain clear with mild dysarthria. S.Ludean Duhart MD

## 2019-03-26 NOTE — Op Note (Signed)
    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  He was then taken down to neuro interventional radiology for treatment of his intracranial aneurysm and carotid stenosis.  The procedure was uncomplicated.  The patient returns to the operating room for removal of the sheath and closure of the carotid artery.  Procedure:  The patient was identified in the holding area and taken to [place] [facility]  The patient was then placed supine on the table. general anesthesia was administered.  The patient was prepped and draped in the usual sterile fashion.  A time out was called and antibiotics were administered.  The previously placed sheath was aspirated through the port.  There was no thrombus.  The sheath was then removed and the previously placed pursestring suture around the arteriotomy was secured, closing the arteriotomy.  Hand-held Doppler was used to evaluate the signal in the common carotid artery which showed no resistance.  There was a normal carotid signal.  I then reversed the patient's heparin with protamine.  The wound was irrigated.  There was diffuse oozing from raw surface areas.  Cautery was used to obtain hemostasis.  There was still diffuse oozing which could not be controlled with septal cautery.  A piece of snow was inserted and manual pressure was then held.  The cause of the ongoing drainage which appeared to be slowing down, I elected to place a 15 French drain which was brought out through a separate  stab incision and secured with a 3-0 nylon.  I then closed the platysma muscle over the drain and closed the skin with a 3-0 Vicryl followed by Dermabond.  There were no immediate complications.   Disposition: To the ICU, intubated   V. Annamarie Major, M.D., [facility] [place] Office: [phone] Pager:  [phone]

## 2019-03-26 NOTE — Progress Notes (Signed)
Patient was  transferred to ICU intubated after today's IR intervention. I wasn't able to see the patient today because of the patients prolonged procedure today. Spoke with ICU team and ICU team will be taking over the service.

## 2019-03-26 NOTE — Progress Notes (Signed)
Pt's belongings form given to Clovis Riley nurse to place in pt' file.

## 2019-03-26 NOTE — Progress Notes (Signed)
Oozing blood from incision site and hardened margins around incision. Marked margins of hardened place. Spoke w/ Dr. Trula Slade. Verbal order to hold heparin until 1900 and continue to closely monitor site. Will call MD w/ any changes.

## 2019-03-26 NOTE — Progress Notes (Signed)
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  TEE also revealed left atrial appendage thrombus and patient on heparin drip.  Patient on 1/27 underwent left carotid CEA followed by left MCA M1 stent angioplasty as well as stent assisted coiling of the left ACA pericallosal aneurysm.  Postprocedure patient admitted to neuro ICU.  Heparin drip has been restarted at 7 PM.  Weaning off sedation, ICU nurse noted patient moving left upper and lower extremities spontaneously but not from the right side.  No forced gaze deviation noted.  Dr. Patrecia Pour was contacted who requested neurology to assess the patient    ROS: Unable to obtain due to poor mental status   Examination  Vital signs in last 24 hours: Temp:  [97.6 F (36.4 C)-97.7 F (36.5 C)] 97.7 F (36.5 C) (01/28 0400) Pulse Rate:  [52-65] 65 (01/28 0325) Resp:  [11-17] 15 (01/28 0700) BP: (91-150)/(55-91) 110/55 (01/28 0700) SpO2:  [100 %] 100 % (01/28 0600) Arterial Line BP: (111-181)/(64-88) 132/73 (01/28 0700) FiO2 (%):  [40 %-100 %] 40 % (01/28 0400)  General: lying in bed CVS: pulse-normal rate and rhythm RS: breathing comfortably Extremities: normal   Neuro: limited due to sedation  MS: not following commands CN: pupils equal and reactive,  EOMI, no forced gaze deviation Motor: minimal withdrawal bilateral extremities Reflexes:  plantars: flexor Coordination: normal Gait: not tested  Basic Metabolic Panel: Recent Labs  Lab 03/23/19 0003 03/23/19 0003 03/24/19 0238 03/24/19 0238 03/25/19 0225 03/25/19 0225 03/26/19 0444 03/26/19 0737 03/26/19 1250 03/26/19 1648 03/27/19 0537  NA 135   < > 136   < > 135   < > 134* 138 136 136 137  K 3.6    < > 3.8   < > 3.5   < > 3.4* 3.4* 3.9 3.8 3.8  CL 102   < > 102   < > 104  --  103 105 104  --  109  CO2 22  --  22  --  21*  --  22  --   --   --  20*  GLUCOSE 108*   < > 109*   < > 106*  --  105* 96 136*  --  121*  BUN 15   < > 18   < > 14  --  11 10 10   --  9  CREATININE 1.43*   < > 1.52*   < > 1.22  --  1.19 1.00 1.00  --  0.96  CALCIUM 8.9   < > 8.5*   < > 8.4*  --  8.4*  --   --   --  7.8*   < > = values in this interval not displayed.    CBC: Recent Labs  Lab 03/23/19 0003 03/23/19 0003 03/24/19 0238 03/24/19 0238 03/25/19 0225 03/25/19 0225 03/26/19 0444 03/26/19 0737 03/26/19 1250 03/26/19 1648 03/27/19 0537  WBC 5.7  --  4.5  --  4.8  --  4.7  --   --   --  6.3  NEUTROABS  --   --   --   --   --   --   --   --   --   --  4.8  HGB 12.7*   < > 11.4*   < > 10.3*   < > 9.6* 9.2* 8.2* 8.2* 8.8*  HCT 37.5*   < > 33.2*   < > 30.0*   < > 28.7* 27.0* 24.0* 24.0* 25.8*  MCV 93.5  --  92.7  --  92.9  --  94.4  --   --   --  93.5  PLT 193  --  194  --  177  --  195  --   --   --  173   < > = values in this interval not displayed.     Coagulation Studies: No results for input(s): LABPROT, INR in the last 72 hours.  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  TEE also revealed left atrial appendage thrombus and patient started on heparin drip.  Patient underwent left CEA as well as left MCA stent and left ACA stent assisted coiling.   Neurology reconsulted for concern for right-sided weakness post procedure  Plan Temporarily held heparin drip Stat CT head to rule out hemorrhage, CT angiogram to rule out in-stent thrombosis After discussion with neuroradiology, also order CT perfusion. MRI brain if patient has persistent right-sided  weakness  Addendum CT head showed no hemorrhage. CTA shows no occlusion of the stent, shows no perfusion deficit.  No need for any acute intervention.  Heparin drip was restarted Followed-up on patient, nurse stated that he is now moving the right side as well.    Karena Addison Caroline Matters [facility] Pager Number [phone] For questions after 7pm please refer to AMION to reach the Neurologist on call

## 2019-03-26 NOTE — Progress Notes (Signed)
[facility] [place]  Subjective  -plan for left common carotid artery exposure with sheath placement in the OR and then to IR with Dr. Estanislado Pandy.  No new neurologic complaints from patient.   Objective (!) 141/72 64 98.5 F (36.9 C) (Oral) 20 100%  Intake/Output Summary (Last 24 hours) at 03/26/2019 0736 Last data filed at 03/26/2019 0552 Gross per 24 hour  Intake 3895.57 ml  Output 1950 ml  Net 1945.57 ml    BUE and BLE motor exam intact, no focal deficits  Laboratory Lab Results: Recent Labs    03/25/19 0225 03/26/19 0444  WBC 4.8 4.7  HGB 10.3* 9.6*  HCT 30.0* 28.7*  PLT 177 195   BMET Recent Labs    03/25/19 0225 03/26/19 0444  NA 135 134*  K 3.5 3.4*  CL 104 103  CO2 21* 22  GLUCOSE 106* 105*  BUN 14 11  CREATININE 1.22 1.19  CALCIUM 8.4* 8.4*    COAG Lab Results  Component Value Date   INR 1.0 03/21/2019   No results found for: PTT  Assessment/Planning:  69 year old male scheduled for left common carotid artery cutdown with sheath placement in the OR and then to IR for intracranial intervention with Dr. Estanislado Pandy.  Risk and benefits discussed with patient.  Marty Heck 03/26/2019 7:36 AM --

## 2019-03-26 NOTE — Anesthesia Procedure Notes (Signed)
Procedure Name: Intubation Date/Time: 03/26/2019 10:32 AM Performed by: Colin Benton, CRNA Pre-anesthesia Checklist: Patient identified, Emergency Drugs available, Suction available and Patient being monitored Patient Re-evaluated:Patient Re-evaluated prior to induction Oxygen Delivery Method: Circle System Utilized Preoxygenation: Pre-oxygenation with 100% oxygen Induction Type: IV induction Ventilation: Mask ventilation without difficulty Laryngoscope Size: Mac and 3 Grade View: Grade II Tube type: Oral Tube size: 8.0 mm Number of attempts: 1 Airway Equipment and Method: Stylet and Oral airway Placement Confirmation: ETT inserted through vocal cords under direct vision,  positive ETCO2 and breath sounds checked- equal and bilateral Secured at: 22 cm Tube secured with: Tape Dental Injury: Teeth and Oropharynx as per pre-operative assessment  Comments: Inserted by Paulina Fusi, SRNA

## 2019-03-26 NOTE — Anesthesia Postprocedure Evaluation (Signed)
Anesthesia Post Note  Patient: Jeremy Sherman  Procedure(s) Performed: EXPOSURE OF LEFT COMMON CAROTID ARTERY AND PLACEMENT OF SHEATH (Left Neck)     Patient location during evaluation: ICU Anesthesia Type: General Level of consciousness: sedated and patient remains intubated per anesthesia plan Pain management: pain level controlled Vital Signs Assessment: post-procedure vital signs reviewed and stable Respiratory status: patient remains intubated per anesthesia plan Cardiovascular status: stable Postop Assessment: no apparent nausea or vomiting Anesthetic complications: no    Last Vitals:  Vitals:   03/26/19 0322 03/26/19 1525  BP: (!) 141/72 136/81  Pulse: 64 60  Resp: 20 16  Temp: 36.9 C   SpO2: 100% 100%    Last Pain:  Vitals:   03/26/19 0358  TempSrc:   PainSc: [name]

## 2019-03-26 NOTE — Progress Notes (Signed)
Pt has heparin gtt and potassium 3.4 with procedure scheduled. Sharlet Salina informed.

## 2019-03-26 NOTE — OR Nursing (Signed)
To OR 11 with the following items in surgical incision.  1 RGS, 1 Blue Vessel Loop.  1 Suture Tag and 2 Yellow Suture Boots around the incision.  These items were removed by Dr. Trula Slade from the surgical site and the beginning of the case.

## 2019-03-26 NOTE — Op Note (Signed)
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  Open exposure of left common carotid artery 2.  Left common carotid artery angiogram 3.  Percutaneous placement of 6 French Pinnacle sheath in the left common carotid artery after cutdown  Surgeon: Dr. 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  He is scheduled for intracranial intervention with Dr. Estanislado Pandy in IR today.  Unfortunately he has severe infrainguinal disease and a transfemoral approach is not an option after evaluation by Dr. Oneida Alar.  He presents today for left common carotid artery exposure above the clavicle with placement of percutaneous sheath after cutdown.  Risk benefits were discussed with the patient.  Findings: Transverse incision over the left clavicle with exposure of the common carotid artery.  A 5-0 Prolene pursestring suture was placed in the common carotid artery and then a micro access sheath was placed.  A left carotid angiogram was performed to identify the carotid bifurcation.  Subsequently placed a 6 French Pinnacle sheath over a J-wire in the left common carotid artery.  Sheath was secured and patient was transported to IR.  Anesthesia: General  Details: Patient was taken to the operating room after informed consent was obtained.  Placed on operative table in the supine position.  General endotracheal anesthesia was induced.  Shoulder roll was placed under his neck and then his head was turned to the right.  We used ultrasound and identified his carotid artery which was marked just above the clavicle as well as the sternocleidomastoid.  The left neck was then prepped and draped in  usual sterile fashion.  Patient got preoperative antibiotics.  Timeout was performed.  Initially placed a transverse incision over the left clavicle one fingerbreadth above the clavicle.  Dissected down with bovie cautery and opened the subcutaneous tissue and platysma with Bovie cautery.  The avascular plane between the heads of the sternocleidomastoid was identified and this was bluntly dissected.  We then mobilized the internal jugular vein laterally and identified the common carotid artery.  One branch of the internal jugular vein was ligated between 3-0 silk ties.  Ultimately we circumferentially dissected around the common carotid artery and placed a vessel loop.  At that point in time a 5-0 Prolene was used to place a pursestring on the anterior wall of the common carotid artery.  This was then accessed with a micro access needle placed a microwire and then a microsheath.  Patient was then given 100 units/kg heparin.  I then performed a left carotid angiogram to identify the carotid bifurcation.  This was subsequently marked and I inserted a J-wire into the micro sheath below the carotid bifurcation.  I then over this inserted a 6 [place] Pinnacle sheath.  I shot another carotid angiogram to ensure no dissection.  The sheath was then secured with multiple 2-0 nylons.  I then packed the neck with saline soaked gauze.  The neck was then covered with sterile towels and covered with sterile [place].  The patient was transported to IR under general anesthesia.  Please see IR dictation for the next portion of the case.  Complication: None  Condition: Stable  Marty Heck, MD [facility] [place] Office: [place]

## 2019-03-26 NOTE — Transfer of Care (Signed)
Immediate Anesthesia Transfer of Care Note  Patient: Jeremy Sherman  Procedure(s) Performed: EXPOSURE OF LEFT COMMON CAROTID ARTERY AND PLACEMENT OF SHEATH (Left Neck)  Patient Location: ICU  Anesthesia Type:General  Level of Consciousness: sedated and Patient remains intubated per anesthesia plan  Airway & Oxygen Therapy: Patient remains intubated per anesthesia plan and Patient placed on Ventilator (see vital sign flow sheet for setting)  Post-op Assessment: Report given to RN and Post -op Vital signs reviewed and stable  Post vital signs: Reviewed and stable  Last Vitals:  Vitals Value Taken Time  BP 136/81 03/26/19 1525  Temp    Pulse 58 03/26/19 1528  Resp 15 03/26/19 1528  SpO2 100 % 03/26/19 1528  Vitals shown include unvalidated device data.  Last Pain:  Vitals:   03/26/19 0358  TempSrc:   PainSc: Asleep         Complications: No apparent anesthesia complications

## 2019-03-26 NOTE — Progress Notes (Signed)
Referring Physician(s): Jani Gravel  Supervising Physician: Luanne Bras  Patient Status:  [facility] - In-pt  Chief Complaint: None- intubated with sedation  Subjective:  Proximal left ICA stenosis and left MCA M1 stenosis s/p revascularization using stent assisted angioplasty along with embolization of left ACA pericallosal aneurysm using stent assisted coiling via left carotid approach 03/26/2019 by Dr. Estanislado Pandy. Patient laying in bed intubated with sedation. Left carotid incision with drain intact, minimal oozing.   Allergies: Patient has no known allergies.  Medications: Prior to Admission medications   Medication Sig Start Date End Date Taking? Authorizing Provider  aspirin EC 81 MG tablet Take 81 mg by mouth daily.   Yes [provider]  omega-3 acid ethyl esters (LOVAZA) 1 g capsule Take 1 g by mouth daily.   Yes [provider]     Vital Signs: BP 116/64   Pulse (!) 56   Temp 98.5 F (36.9 C) (Oral)   Resp 12   Ht 6\' 5"  (1.956 m)   Wt 233 lb 14.5 oz (106.1 kg)   SpO2 100%   BMI 27.74 kg/m   Physical Exam Vitals and nursing note reviewed.  Constitutional:      General: He is not in acute distress.    Comments: Intubated with sedation.  Cardiovascular:     Comments: Right carotid incision with minimal oozing, JP intact with approximately 50 cc blood with clots. Pulmonary:     Effort: Pulmonary effort is normal. No respiratory distress.     Comments: Intubated with sedation. Skin:    General: Skin is warm and dry.  Neurological:     Comments: Intubated with sedation. Distal pulses palpable bilaterally with Doppler.     Imaging: US RENAL  Result Date: 03/24/2019 CLINICAL DATA:  Elevated BUN and creatinine EXAM: RENAL / URINARY TRACT ULTRASOUND COMPLETE COMPARISON:  None. FINDINGS: Right Kidney: Renal measurements: 10.4 x 5.4 x 5.4 cm = volume: 158 mL. 1.4 cm midpole cyst. Normal echotexture. No suspicious mass or hydronephrosis.  Left Kidney: Renal measurements: 11.1 x 6.4 x 4.8 cm = volume: 179 mL. Echogenicity within normal limits. No mass or hydronephrosis visualized. Bladder: Decompressed with Foley catheter in place. Other: None. IMPRESSION: No acute findings.  No hydronephrosis. Electronically Signed   By: Rolm Baptise M.D.   On: 03/24/2019 21:58   DG Chest Port 1 View  Result Date: 03/26/2019 CLINICAL DATA:  Endotracheal tube placement. Hypertension. Coronary artery disease. EXAM: PORTABLE CHEST 1 VIEW COMPARISON:  03/17/2019 from [place]: Prior median sternotomy. Endotracheal tube terminates 5.5 cm above carina. Nasogastric terminates at the body of the stomach with the side port likely just at the gastroesophageal junction. Numerous leads and wires project over the chest. Cardiomegaly accentuated by AP portable technique. The apices are partially excluded. No pleural fluid. Low lung volumes with resultant pulmonary interstitial prominence. New subsegmental atelectasis at the left lung base. IMPRESSION: Nasogastric tube borderline low in position.  Consider advancement. Otherwise, appropriate position of support apparatus. Diminished lung volumes with new left lower lobe subsegmental atelectasis. Cardiomegaly without congestive failure. Electronically Signed   By: Abigail Miyamoto M.D.   On: 03/26/2019 16:16   DG Abd 2 Views  Result Date: 03/23/2019 CLINICAL DATA:  Patient status post stroke.  Abdominal distension. EXAM: ABDOMEN - 2 VIEW COMPARISON:  None. FINDINGS: The bowel gas pattern is normal. There is no evidence of free air. No radio-opaque calculi or other significant radiographic abnormality is seen. Contrast in a distended urinary bladder  from the patient's angiogram is noted. IMPRESSION: Normal bowel gas pattern. Distended urinary bladder. Electronically Signed   By: Inge Rise M.D.   On: 03/23/2019 11:24   DG C-Arm 1-60 Min-No Report  Result Date: 03/26/2019 Fluoroscopy was utilized by the  requesting physician.  No radiographic interpretation.    Labs:  CBC: Recent Labs    03/23/19 0003 03/23/19 0003 03/24/19 0238 03/24/19 0238 03/25/19 0225 03/26/19 0444 03/26/19 0737 03/26/19 1250  WBC 5.7  --  4.5  --  4.8 4.7  --   --   HGB 12.7*   < > 11.4*   < > 10.3* 9.6* 9.2* 8.2*  HCT 37.5*   < > 33.2*   < > 30.0* 28.7* 27.0* 24.0*  PLT 193  --  194  --  177 195  --   --    < > = values in this interval not displayed.    COAGS: Recent Labs    03/21/19 0413  INR 1.0    BMP: Recent Labs    03/23/19 0003 03/23/19 0003 03/24/19 0238 03/24/19 0238 03/25/19 0225 03/26/19 0444 03/26/19 0737 03/26/19 1250  NA 135   < > 136   < > 135 134* 138 136  K 3.6   < > 3.8   < > 3.5 3.4* 3.4* 3.9  CL 102   < > 102   < > 104 103 105 104  CO2 22  --  22  --  21* 22  --   --   GLUCOSE 108*   < > 109*   < > 106* 105* 96 136*  BUN 15   < > 18   < > 14 11 10 10   CALCIUM 8.9  --  8.5*  --  8.4* 8.4*  --   --   CREATININE 1.43*   < > 1.52*   < > 1.22 1.19 1.00 1.00  GFRNONAA 49*  --  46*  --  60* >60  --   --   GFRAA 57*  --  53*  --  >60 >60  --   --    < > = values in this interval not displayed.    LIVER FUNCTION TESTS: Recent Labs    03/21/19 0413 03/22/19 0112  BILITOT 0.9 0.7  AST 23 22  ALT 16 15  ALKPHOS 61 64  PROT 7.4 7.6  ALBUMIN 3.5 3.5    Assessment and Plan:  Proximal left ICA stenosis and left MCA M1 stenosis s/p revascularization using stent assisted angioplasty along with embolization of left ACA pericallosal aneurysm using stent assisted coiling via left carotid approach 03/26/2019 by Dr. Estanislado Pandy. Patient's condition stable- remains intubated with sedation. Patient to remain intubated until results of carotid US in AM (CCM aware). Oozing from left carotid incision- per VVS hold heparin x3 hours and reassess. Jerene Pitch, RN and Dr. Estanislado Pandy aware. Further plans per CCM/VVS/neurology/TRH- appreciate and agree with management. NIR to  follow.   Electronically Signed: Earley Abide, PA-C 03/26/2019, 4:21 PM   I spent a total of 25 Minutes at the the patient's bedside AND on the patient's hospital floor or unit, greater than 50% of which was counseling/coordinating care for proximal left ICA stenosis and left MCA M1 stenosis s/p revascularization, and left ACA pericallosal aneurysm s/p embolization.

## 2019-03-26 NOTE — Consult Note (Addendum)
NAME:  Jeremy Sherman, MRN:  [id number], DOB:  06/21/1950, LOS: 6 ADMISSION DATE:  03/20/2019, CONSULTATION DATE:  1/27 REFERRING MD:  Estanislado Pandy, CHIEF COMPLAINT:  Stroke; ventilator management s/p Neuro IR   Brief History   69 year old initially admitted 1/18 for dizziness and syncope. 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  PCCM consulted on 1/27 by neuro interventional radiology to assist w/ care s/p planned intervention involving what was felt to be probably dissecting ACA aneurysm, and endovascular treatment of left MCA stenosis.  History of present illness   69 year old male patient with history as mentioned below presented to [facility] ER on 1/18 with chief complaint of dizziness and syncopal episode witnessed at primary care provider's office, with associated unresponsiveness, diaphoresis and hypotension. 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 Neuro and neuro IR consulted.  Had cerebral angiogram 1/22 post procedure had cold right foot. Vascular surg consulted. ABI and dopplers showed intact flow and was felt represented chronic changes. Recommended angiogram if symptomatic.  To IR on 1/27 for planned intervention involving what was felt to be probably dissecting ACA aneurysm, and endovascular treatment of left MCA stenosis. PCCM asked to assist w/ post-operative care.    Past Medical History  Hypothyroidism, hypertension, hyperlipidemia, prediabetes, coronary artery disease with prior bypass surgery 07/02/2007, smoker. Note: [facility] 1/12 through 1/14 with dizziness felt to be secondary to hypertensive urgency  Significant Hospital Events   1/18 MRI showed early subacute infarct within the right cerebellum  and right brachium pontis, also acute left Puctate and left parietal lobe infarct. 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 ECHO showed  TEE EF 45-50% , left atrial appendage thrombus, and mild MR.  Hospital course: admitted 1/18 1/22 both neuro and N-IR consulted. Started on asa and plavix. As well as high dose statin. As well as therapeutic AC.  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 3. Approx 75% LT ICA prox stenosis. Vascular surg consulted s/p angiogram lost pulses to right foot. Duplex and ABIs showed CFA and profunda open. felt this all represented chronic disease.  Recommended A-gram if symptomatic.  1/23 followed by stroke team recommending BP goal 130-150, anticoagulation and further cards eval to look for AF given the LA thrombus.  1/27 To IR for planned intervention involving what was felt to be probably dissecting ACA aneurysm, and endovascular treatment of left MCA stenosis. PCCM asked to assist w/ post-operative care.  After undergoing stent angioplasty of left MCA and stent assisted coiling of left ACA aneurysm.  Went to general OR for closure of carotid access site by vascular surgery,  Consults:  Neuro   Procedures:  Cerebral angiogram 1/22:  severe 90% left MCA M1 stenosis.  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  75% left ICA proximal stenosis. 1/27: stent angioplasty of left MCA and stent assisted coiling of left ACA aneurysm.  Went to general OR for closure of carotid access site by vascular surgery,   Significant Diagnostic Tests:  MRI head - OSH - anterior cerebral artery pericallosal aneurysm. Showed an early/ subacute infarct with  in the right cerebellum in the right brachium pontis TEE 1/21- OSH - Left atrial appendage thrombus  1/22: ABI right foot: Pt common  femoral and profunda open. Most likely this is all chronic disease (Fields)  Micro Data:    Antimicrobials:    Interim history/subjective:  Sedated  Objective   Blood pressure (Abnormal) 141/72, pulse 64, temperature 98.5 F (36.9 C), temperature source Oral, resp. rate 20, height 6\' 5"  (1.956 m), weight 106.1 kg, SpO2 100 %.        Intake/Output Summary (Last 24 hours) at 03/26/2019 1020 Last data filed at 03/26/2019 1008 Gross per 24 hour  Intake 4395.57 ml  Output 2100 ml  Net 2295.57 ml   Filed Weights   03/20/19 1842  Weight: 106.1 kg    Examination: General: 69 year old male patient currently sedated on propofol infusion HENT: Normocephalic atraumatic orally intubated pupils equal reactive, left carotid JP drain with dark bloody semiclotted output, JP currently charged Lungs: Clear bilaterally, plateau pressures 24, currently FiO2 100% awaiting ABG Cardiovascular: Soft systolic murmur sinus rhythm Abdomen: Soft nontender Extremities: Cool lower extremity edema right extremity require Doppler check, femoral arterial site dressing intact,lower extremities are dry and crusted bilaterally Neuro: Sedated pupils equal reactive GU: Mild hematuria Foley catheter in place  Resolved Hospital Problem list     Assessment & Plan:  S/p IR coiling of left ACA pericallosal aneurysm and stent placement of severely stenosed MCA at M1.  Plan Post care per IR w/ goal SBP 120-140 Serial neuro checks Keep intubated through the night. He will need [place] of carotid artery prior to extubation->to r/o risk of UAW obstruction.   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   Mild acute anemia. No evidence of bleeding but has has dropped from 12.7 down to 9.2 over course of hospitalization Plan Trend cbc  DM and hyperglycemia  Plan ssi   History of BPH Plan Foley catheter in place Resume Flomax at some point  Best practice:  Diet: NPO Pain/Anxiety/Delirium protocol (if indicated): VAD protocol 1/27 VAP protocol (if indicated): Ordered 1/27 DVT prophylaxis: IV heparin since 1/21 GI prophylaxis: PPI Glucose control: SSI  Mobility: Bedrest Code Status: FULL CODE  Family Communication: Pending Disposition: ICU monitoring  Labs   CBC: Recent Labs  Lab 03/22/19 0112 03/22/19 0112 03/23/19 0003 03/24/19 0238 03/25/19 0225 03/26/19 0444 03/26/19 0737  WBC 5.2  --  5.7 4.5 4.8 4.7  --   HGB 12.7*   < > 12.7* 11.4* 10.3* 9.6* 9.2*  HCT 37.7*   < > 37.5* 33.2* 30.0* 28.7* 27.0*  MCV 93.8  --  93.5 92.7 92.9 94.4  --   PLT 199  --  193 194 177 195  --    < > = values in this interval not displayed.    Basic Metabolic Panel: Recent Labs  Lab 03/22/19 0112 03/22/19 0112 03/23/19 0003 03/24/19 0238 03/25/19 0225 03/26/19 0444 03/26/19 0737  NA 136   < > 135 136 135 134* 138  K 3.3*   < > 3.6 3.8 3.5 3.4* 3.4*  CL 101   < >  102 102 104 103 105  CO2 23  --  22 22 21* 22  --   GLUCOSE 109*   < > 108* 109* 106* 105* 96  BUN 11   < > 15 18 14 11 10   CREATININE 1.06   < > 1.43* 1.52* 1.22 1.19 1.00  CALCIUM 9.0  --  8.9 8.5* 8.4* 8.4*  --    < > = values in this interval not displayed.   GFR: Estimated Creatinine Clearance: 86.6 mL/min (by C-G formula based on SCr of 1 mg/dL). Recent Labs  Lab 03/23/19 0003 03/24/19 0238 03/25/19 0225 03/26/19 0444  WBC 5.7 4.5 4.8 4.7    Liver Function Tests: Recent Labs  Lab 03/21/19 0413 03/22/19 0112  AST 23 22  ALT 16 15  ALKPHOS 61 64  BILITOT 0.9 0.7  PROT 7.4 7.6  ALBUMIN 3.5 3.5     No results for input(s): LIPASE, AMYLASE in the last 168 hours. No results for input(s): AMMONIA in the last 168 hours.  ABG    Component Value Date/Time   TCO2 23 03/26/2019 0737     Coagulation Profile: Recent Labs  Lab 03/21/19 0413  INR 1.0    Cardiac Enzymes: No results for input(s): CKTOTAL, CKMB, CKMBINDEX, TROPONINI in the last 168 hours.  HbA1C: Hgb A1c MFr Bld  Date/Time Value Ref Range Status  03/21/2019 04:13 AM 6.4 (H) 4.8 - 5.6 % Final    Comment:    (NOTE) Pre diabetes:          5.7%-6.4% Diabetes:              >6.4% Glycemic control for   <7.0% adults with diabetes     CBG: Recent Labs  Lab 03/25/19 1114 03/25/19 1658 03/25/19 2028 03/25/19 2353 03/26/19 0321  GLUCAP 110* 103* 120* 101* 98    Review of Systems:   Not able   Past Medical History  He,  has a past medical history of CAD (coronary artery disease), Carotid stenosis, left, Hyperlipidemia, Hypertension, Hypertensive urgency (03/11/2019), Hypothyroidism, and Prediabetes.   Surgical History    Past Surgical History:  Procedure Laterality Date  . CORONARY ARTERY BYPASS GRAFT  07/02/2007   [place]-> LAD SVG-> PD branch of RCA [name]) at [facility]  . IR ANGIO INTRA EXTRACRAN SEL COM CAROTID INNOMINATE BILAT MOD SED  03/21/2019  . IR ANGIO VERTEBRAL SEL SUBCLAVIAN INNOMINATE BILAT MOD SED  03/21/2019     Social History   reports that he has been smoking cigarettes. He started smoking about 45 years ago. He has a 22.50 pack-year smoking history. He has never used smokeless tobacco. He reports previous alcohol use.   Family History   His family history includes Stroke in his mother.   Allergies No Known Allergies   Home Medications  Prior to Admission medications   Medication Sig Start Date End Date Taking? Authorizing Provider  aspirin EC 81 MG tablet Take 81 mg by mouth daily.   Yes [provider]  omega-3 acid ethyl esters (LOVAZA) 1 g  capsule Take 1 g by mouth daily.   Yes [provider]     Critical care time: 32 minutes     Erick Colace ACNP-BC [facility] Pager # [phone] OR # [phone] if no answer

## 2019-03-26 NOTE — Progress Notes (Addendum)
Pt has gray and yellow color watch placed in plastic bag. Charge nurse called security and spoke with "Jake". Patient valuable form filled out. Pt already gone to OR. Jami Steelman and I both witnessed sealed bag with valuable. Crae nurse will transport belongings to security. On-coming nurse to be informed of placement of pt's valuable and form to be placed in pt binder.

## 2019-03-26 NOTE — Anesthesia Procedure Notes (Signed)
Arterial Line Insertion Start/End1/27/2021 6:45 AM, 03/26/2019 7:05 AM Performed by: Renato Shin, CRNA, CRNA  Preanesthetic checklist: patient identified, IV checked, site marked, risks and benefits discussed, surgical consent, monitors and equipment checked, pre-op evaluation, timeout performed and anesthesia consent Lidocaine 1% used for infiltration Left, radial was placed Catheter size: 20 G Hand hygiene performed , maximum sterile barriers used  and Seldinger technique used Allen's test indicative of satisfactory collateral circulation Attempts: 3 Procedure performed without using ultrasound guided technique. Following insertion, dressing applied and Biopatch. Post procedure assessment: normal and unchanged  Patient tolerated the procedure well with no immediate complications.

## 2019-03-26 NOTE — Progress Notes (Signed)
ANTICOAGULATION CONSULT NOTE  Pharmacy Consult for Heparin Indication: Left Atrial Appendage Thrombus  No Known Allergies  Patient Measurements: Height: 6\' 5"  (195.6 cm) Weight: 233 lb 14.5 oz (106.1 kg) IBW/kg (Calculated) : 89.1 Heparin Dosing Weight: 106.1 kg   Vital Signs: BP: 136/81 (01/27 1525) Pulse Rate: 60 (01/27 1525)  Labs: Recent Labs    03/24/19 0238 03/24/19 0238 03/24/19 1548 03/25/19 0225 03/25/19 0225 03/26/19 0444 03/26/19 0444 03/26/19 0737 03/26/19 1250  HGB 11.4*   < >  --  10.3*   < > 9.6*   < > 9.2* 8.2*  HCT 33.2*   < >  --  30.0*   < > 28.7*  --  27.0* 24.0*  PLT 194  --   --  177  --  195  --   --   --   HEPARINUNFRC 0.15*   < > 0.14* 0.30  --  0.27*  --   --   --   CREATININE 1.52*   < >  --  1.22   < > 1.19  --  1.00 1.00   < > = values in this interval not displayed.    Estimated Creatinine Clearance: 86.6 mL/min (by C-G formula based on SCr of 1 mg/dL).   Assessment: Pharmacy is consulted to dose heparin in 69 yr old male with left atrial appendage thrombus shown on TEE from 03/20/[facility] at [facility]. 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  S/p revascularization left ICA stenosis and left MCA stenosis by IR. Discussed with IR PA and will use our Post IR low dose tonight and reassess need for higher heparin dosing in am.   Goal of Therapy:  Heparin level: 0.1-0.25 units/ml overnight Monitor platelets by anticoagulation protocol: Yes   Plan:  -Increase heparin to 750 units / hr -8 hour heparin level -Daily heparin level and CBC  Erin Hearing PharmD., BCPS Clinical Pharmacist 03/26/2019 4:08 PM

## 2019-03-26 NOTE — Progress Notes (Signed)
Patient transported on ventilator from 4N29 to CT and back with no complications.

## 2019-03-27 ENCOUNTER — Encounter: Payer: Self-pay | Admitting: *Deleted

## 2019-03-27 ENCOUNTER — Inpatient Hospital Stay (HOSPITAL_COMMUNITY): Payer: BC Managed Care – PPO

## 2019-03-27 ENCOUNTER — Inpatient Hospital Stay: Payer: Self-pay

## 2019-03-27 DIAGNOSIS — I671 Cerebral aneurysm, nonruptured: Secondary | ICD-10-CM

## 2019-03-27 DIAGNOSIS — J9600 Acute respiratory failure, unspecified whether with hypoxia or hypercapnia: Secondary | ICD-10-CM

## 2019-03-27 DIAGNOSIS — R0689 Other abnormalities of breathing: Secondary | ICD-10-CM

## 2019-03-27 LAB — CBC WITH DIFFERENTIAL/PLATELET
Abs Immature Granulocytes: 0.04 10*3/uL (ref 0.00–0.07)
Basophils Absolute: 0 10*3/uL (ref 0.0–0.1)
Basophils Relative: 0 %
Eosinophils Absolute: 0 10*3/uL (ref 0.0–0.5)
Eosinophils Relative: 0 %
HCT: 25.8 % — ABNORMAL LOW (ref 39.0–52.0)
Hemoglobin: 8.8 g/dL — ABNORMAL LOW (ref 13.0–17.0)
Immature Granulocytes: 1 %
Lymphocytes Relative: 12 %
Lymphs Abs: 0.8 10*3/uL (ref 0.7–4.0)
MCH: 31.9 pg (ref 26.0–34.0)
MCHC: 34.1 g/dL (ref 30.0–36.0)
MCV: 93.5 fL (ref 80.0–100.0)
Monocytes Absolute: 0.7 10*3/uL (ref 0.1–1.0)
Monocytes Relative: 10 %
Neutro Abs: 4.8 10*3/uL (ref 1.7–7.7)
Neutrophils Relative %: 77 %
Platelets: 173 10*3/uL (ref 150–400)
RBC: 2.76 MIL/uL — ABNORMAL LOW (ref 4.22–5.81)
RDW: 15.9 % — ABNORMAL HIGH (ref 11.5–15.5)
WBC: 6.3 10*3/uL (ref 4.0–10.5)
nRBC: 0 % (ref 0.0–0.2)

## 2019-03-27 LAB — ECHOCARDIOGRAM COMPLETE
Height: 77 in
Weight: 3742.53 oz

## 2019-03-27 LAB — BASIC METABOLIC PANEL
Anion gap: 8 (ref 5–15)
BUN: 9 mg/dL (ref 8–23)
CO2: 20 mmol/L — ABNORMAL LOW (ref 22–32)
Calcium: 7.8 mg/dL — ABNORMAL LOW (ref 8.9–10.3)
Chloride: 109 mmol/L (ref 98–111)
Creatinine, Ser: 0.96 mg/dL (ref 0.61–1.24)
GFR calc Af Amer: >60 mL/min (ref >60)
GFR calc non Af Amer: >60 mL/min (ref >60)
Glucose, Bld: 121 mg/dL — ABNORMAL HIGH (ref 70–99)
Potassium: 3.8 mmol/L (ref 3.5–5.1)
Sodium: 137 mmol/L (ref 135–145)

## 2019-03-27 LAB — HEPARIN LEVEL (UNFRACTIONATED)
Heparin Unfractionated: 0.23 IU/mL — ABNORMAL LOW (ref 0.30–0.70)
Heparin Unfractionated: 0.24 IU/mL — ABNORMAL LOW (ref 0.30–0.70)
Heparin Unfractionated: 0.31 IU/mL (ref 0.30–0.70)

## 2019-03-27 LAB — TRIGLYCERIDES: Triglycerides: 219 mg/dL — ABNORMAL HIGH (ref <150)

## 2019-03-27 LAB — TYPE AND SCREEN
ABO/RH(D): O POS
Antibody Screen: NEGATIVE
Unit division: 0

## 2019-03-27 LAB — POCT I-STAT 7, (LYTES, BLD GAS, ICA,H+H)
Bicarbonate: 25.5 mmol/L (ref 20.0–28.0)
Calcium, Ion: 1.12 mmol/L — ABNORMAL LOW (ref 1.15–1.40)
HCT: 25 % — ABNORMAL LOW (ref 39.0–52.0)
Hemoglobin: 8.5 g/dL — ABNORMAL LOW (ref 13.0–17.0)
O2 Saturation: 100 %
Potassium: 3.9 mmol/L (ref 3.5–5.1)
Sodium: 138 mmol/L (ref 135–145)
TCO2: 27 mmol/L (ref 22–32)
pCO2 arterial: 46.4 mmHg (ref 32.0–48.0)
pH, Arterial: 7.348 — ABNORMAL LOW (ref 7.350–7.450)
pO2, Arterial: 318 mmHg — ABNORMAL HIGH (ref 83.0–108.0)

## 2019-03-27 LAB — GLUCOSE, CAPILLARY
Glucose-Capillary: 95 mg/dL (ref 70–99)
Glucose-Capillary: 80 mg/dL (ref 70–99)
Glucose-Capillary: 78 mg/dL (ref 70–99)

## 2019-03-27 LAB — BPAM RBC
Blood Product Expiration Date: 202102232359
ISSUE DATE / TIME: 202101271404
Unit Type and Rh: 5100

## 2019-03-27 LAB — POCT ACTIVATED CLOTTING TIME: Activated Clotting Time: 241 seconds

## 2019-03-27 LAB — PLATELET INHIBITION P2Y12
Platelet Function  P2Y12: 162 [PRU] — ABNORMAL LOW (ref 182–335)
Platelet Function  P2Y12: 21 [PRU] — ABNORMAL LOW (ref 182–335)

## 2019-03-27 MED ORDER — TICAGRELOR 90 MG PO TABS
90.0000 mg | ORAL_TABLET | Freq: Two times a day (BID) | ORAL | Status: DC
  Filled 2019-03-27: qty 1

## 2019-03-27 MED ORDER — SODIUM CHLORIDE 0.9% FLUSH
10.0000 mL | INTRAVENOUS | Status: DC | PRN
  Administered 2019-04-02: 20 mL

## 2019-03-27 MED ORDER — SODIUM CHLORIDE 0.9% FLUSH
10.0000 mL | Freq: Two times a day (BID) | INTRAVENOUS | Status: DC
  Administered 2019-03-27 – 2019-04-03 (×14): 10 mL

## 2019-03-27 MED ORDER — TICAGRELOR 90 MG PO TABS: 180.0000 mg | ORAL_TABLET | Freq: Once | ORAL | Status: DC

## 2019-03-27 MED ORDER — ASPIRIN 81 MG PO CHEW
81.0000 mg | CHEWABLE_TABLET | Freq: Every day | ORAL | Status: DC
  Administered 2019-03-27: 09:00:00 81 mg
  Filled 2019-03-27: qty 1

## 2019-03-27 MED ORDER — HEPARIN (PORCINE) 25000 UT/250ML-% IV SOLN
1050.0000 [IU]/h | INTRAVENOUS | Status: DC
  Administered 2019-03-27: 900 [IU]/h via INTRAVENOUS
  Filled 2019-03-27: qty 250

## 2019-03-27 MED ORDER — SODIUM CHLORIDE 0.9% FLUSH: 10.0000 mL | INTRAVENOUS | Status: DC | PRN

## 2019-03-27 MED ORDER — POTASSIUM CHLORIDE 20 MEQ/15ML (10%) PO SOLN
40.0000 meq | Freq: Once | ORAL | Status: AC
  Administered 2019-03-27: 40 meq

## 2019-03-27 MED ORDER — TICAGRELOR 90 MG PO TABS: 90.0000 mg | ORAL_TABLET | Freq: Two times a day (BID) | ORAL | Status: DC

## 2019-03-27 MED ORDER — TICAGRELOR 90 MG PO TABS
90.0000 mg | ORAL_TABLET | Freq: Two times a day (BID) | ORAL | Status: DC
  Administered 2019-03-27 – 2019-04-03 (×14): 90 mg via ORAL
  Filled 2019-03-27 (×13): qty 1

## 2019-03-27 MED ORDER — SODIUM CHLORIDE 0.9% FLUSH
10.0000 mL | Freq: Two times a day (BID) | INTRAVENOUS | Status: DC
  Administered 2019-03-27 – 2019-04-02 (×13): 10 mL

## 2019-03-27 NOTE — Consult Note (Signed)
NAME:  Jeremy Sherman, MRN:  [id number], DOB:  1950/12/20, LOS: 7 ADMISSION DATE:  03/20/2019, CONSULTATION DATE:  1/27 REFERRING MD:  Estanislado Pandy, CHIEF COMPLAINT:  Stroke; ventilator management s/p Neuro IR   Brief History    69 year old male patient with history as mentioned below presented to [facility] ER on 1/18 with chief complaint of dizziness and syncopal episode witnessed at primary care provider's office, with associated unresponsiveness, diaphoresis and hypotension. 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 Neuro and neuro IR consulted.  Had cerebral angiogram 1/22 post procedure had cold right foot. Vascular surg consulted. ABI and dopplers showed intact flow and was felt represented chronic changes. Recommended angiogram if symptomatic.  To IR on 1/27 for planned intervention involving what was felt to be probably dissecting ACA aneurysm, and endovascular treatment of left MCA stenosis. PCCM asked to assist w/ post-operative care.    Past Medical History  Hypothyroidism, hypertension, hyperlipidemia, prediabetes, coronary artery disease with prior bypass surgery 07/02/2007, smoker. Note: [facility] 1/12 through 1/14 with dizziness felt to be secondary to hypertensive urgency  Significant Hospital Events   1/18 MRI showed early subacute infarct within the right cerebellum and right brachium pontis, also acute left Puctate and left parietal lobe infarct. 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 ECHO showed  TEE EF 45-50% , left atrial appendage thrombus, and mild MR.  Hospital course: admitted 1/18 1/22 both neuro and N-IR consulted. Started on asa and plavix. As well as high dose statin. As well as therapeutic AC.  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 3. Approx 75% LT ICA prox stenosis. Vascular surg consulted s/p angiogram lost pulses to right foot. Duplex and ABIs showed CFA and profunda open. felt this all represented chronic disease.  Recommended A-gram if symptomatic.  1/23 followed by stroke team recommending BP goal 130-150, anticoagulation and further cards eval to look for AF given the LA thrombus.  1/27 To IR for planned intervention involving what was felt to be probably dissecting ACA aneurysm, and endovascular treatment of left MCA stenosis. PCCM asked to assist w/ post-operative care.  After undergoing stent angioplasty of left MCA and stent assisted coiling of left ACA aneurysm.  Went to general OR for closure of carotid access site by vascular surgery,  Consults:  Neuro   Procedures:  Cerebral angiogram 1/22:  severe 90% left MCA M1 stenosis.  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  75% left ICA proximal stenosis. 1/27: stent angioplasty of left MCA and stent assisted coiling of left ACA aneurysm.  Went to general OR for closure of carotid access site by vascular surgery,   Significant Diagnostic Tests:  MRI head - OSH - anterior cerebral artery pericallosal aneurysm. Showed an early/ subacute infarct with in the right cerebellum in the right brachium pontis TEE 1/21- OSH - Left atrial appendage thrombus  1/22: ABI right foot: Pt common femoral and profunda open. Most likely this is all chronic disease (Fields)  Micro Data:    Antimicrobials:    Interim history/subjective:    03/27/2019 - Qtc 489msec per tech. Cuff leak Positive. No active bleeding from carotid drain. Light WUA assessment earlier by RN ->  Followed  commands and neuro intact. Carotid US still pending. Off neo, On cleviprex and neo - target sbp 10-140. ON Iv heparin gtt  Objective   Blood pressure 131/85, pulse 65, temperature  98.5 F (36.9 C), temperature source Axillary, resp. rate 16, height 6\' 5"  (1.956 m), weight 106.1 kg, SpO2 100 %.    Vent Mode: PRVC FiO2 (%):  [40 %-100 %] 40 % Set Rate:  [12 bmp] 12 bmp Vt Set:  [700 mL] 700 mL PEEP:  [5 cmH20] 5 cmH20 Plateau Pressure:  [19 cmH20-20 cmH20] 20 cmH20   Intake/Output Summary (Last 24 hours) at 03/27/2019 [id number] Last data filed at 03/27/2019 0800 Gross per 24 hour  Intake 4969.66 ml  Output 1900 ml  Net 3069.66 ml   Filed Weights   03/20/19 1842  Weight: 106.1 kg   General Appearance:  Looks criticall ill OBESE - no Head:  Normocephalic, without obvious abnormality, atraumatic Eyes:  PERRL - yes, conjunctiva/corneas - muddy     Ears:  Normal external ear canals, both ears Nose:  G tube - n Throat:  ETT TUBE - yes , OG tube - yes Neck:  Supple,  No enlargement/tenderness/nodules. HAS LEFT CAROTID DRAIN - no active bleeding Lungs: Clear to auscultation bilaterally, Ventilator   Synchrony - yes Heart:  S1 and S2 normal, no murmur, CVP - no.  Pressors - no Abdomen:  Soft, no masses, no organomegaly Genitalia / Rectal:  Not done Extremities:  Extremities- intact Skin:  ntact in exposed areas . Sacral area - not examined Neurologic:  Sedation - diprivan gtt -> RASS - -3. 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  Need to ensure no upper airway obstruction prior to extubation. Cuff leak +  Plan Ok to do SBT off diprivan Await Carotid US v [facility] input on severity/risk of neck hematoma Full vent support otherwise F/u cxr and abg Daily assessment for SBT VAP bundle   Fluid and electrolyte imbalance:   - 03/27/2019 - mild low K  Plan Replete K Monitor  Mild acute anemia. No evidence of bleeding but has has dropped from 12.7 down to 9.2 over course of hospitalization  03/27/2019 -no active bleed  Plan - PRBC for hgb </= 6.9gm%    - exceptions are   -  if ACS susepcted/confirmed then transfuse for hgb </= 8.0gm%,  or    -  active bleeding with hemodynamic instability, then transfuse regardless of hemoglobin value   At at all times try to transfuse 1 unit prbc as  possible with exception of active hemorrhage    DM and hyperglycemia  Plan ssi   History of BPH Plan Foley catheter in place Resume Flomax at some point  Best practice:  Diet: NPO Pain/Anxiety/Delirium protocol (if indicated): VAD protocol 1/27 VAP protocol (if indicated): Ordered 1/27 DVT prophylaxis: IV heparin since 1/21 GI prophylaxis: PPI Glucose control: SSI  Mobility: Bedrest Code Status: FULL CODE  Family Communication: none at bedside Disposition: ICU monitoring     ATTESTATION & SIGNATURE   The patient Jeremy Sherman is critically ill with multiple organ systems failure and requires high complexity decision making for assessment and support, frequent evaluation and titration of therapies, application of advanced monitoring technologies and extensive interpretation of multiple databases.   Critical Care  Time devoted to patient care services described in this note is  31  Minutes. This time reflects time of care of this signee Dr Brand Males. This critical care time does not reflect procedure time, or teaching time or supervisory time of PA/NP/Med student/Med Resident etc but could involve care discussion time     Dr. Brand Males, M.D., [facility].C.P Pulmonary and Critical Care Medicine Staff Physician [place] [facility] Pager: [phone], If no answer or between  15:00h - 7:00h: call [phone]  03/27/2019 8:24 AM    LABS    PULMONARY Recent Labs  Lab 03/26/19 0737 03/26/19 1250 03/26/19 1406 03/26/19 1648  PHART  --   --  7.348* 7.385  PCO2ART  --   --  46.4 41.2  PO2ART  --   --  318.0* 386.0*  HCO3  --   --  25.5 24.8  TCO2 23 24 27 26   O2SAT  --   --  100.0 100.0    CBC Recent Labs  Lab 03/25/19 0225 03/25/19 0225 03/26/19 0444 03/26/19 0737 03/26/19 1406 03/26/19 1648 03/27/19 0537  HGB 10.3*   < > 9.6*   < > 8.5* 8.2* 8.8*  HCT 30.0*   < > 28.7*   < > 25.0* 24.0* 25.8*  WBC 4.8  --  4.7  --   --   --  6.3  PLT 177  --  195  --   --   --  173   < > = values in this interval not displayed.    COAGULATION Recent Labs  Lab 03/21/19 0413  INR 1.0    CARDIAC  No results for input(s): TROPONINI in the last 168 hours. No results for input(s): PROBNP in the last 168 hours.   CHEMISTRY Recent Labs  Lab 03/23/19 0003 03/23/19 0003 03/24/19 0238 03/24/19 0238 03/25/19 0225 03/25/19 0225 03/26/19 0444 03/26/19 0444 03/26/19 0737 03/26/19 0737 03/26/19 1250 03/26/19 1250 03/26/19 1406 03/26/19 1406 03/26/19 1648 03/27/19 0537  NA 135   < > 136   < > 135   < > 134*   < > 138  --  136  --  138  --  136 137  K 3.6   < > 3.8   < > 3.5   < > 3.4*   < > 3.4*   < > 3.9   < > 3.9   < > 3.8 3.8  CL 102   < > 102   < > 104  --  103  --  105  --  104  --   --   --   --  109  CO2 22  --  22  --  21*  --  22   --   --   --   --   --   --   --   --  20*  GLUCOSE 108*   < > 109*   < > 106*  --  105*  --  96  --  136*  --   --   --   --  121*  BUN 15   < > 18   < > 14  --  11  --  10  --  10  --   --   --   --  9  CREATININE 1.43*   < > 1.52*   < > 1.22  --  1.19  --  1.00  --  1.00  --   --   --   --  0.96  CALCIUM 8.9  --  8.5*  --  8.4*  --  8.4*  --   --   --   --   --   --   --   --  7.8*   < > = values in this interval not displayed.   Estimated Creatinine Clearance: 90.2 mL/min (by C-G formula based on SCr of 0.96 mg/dL).   LIVER Recent Labs  Lab 03/21/19 0413 03/22/19 0112  AST 23 22  ALT 16 15  ALKPHOS 61 64  BILITOT 0.9 0.7  PROT 7.4 7.6  ALBUMIN 3.5 3.5  INR 1.0  --      INFECTIOUS No results for input(s): LATICACIDVEN, PROCALCITON in the last 168 hours.   ENDOCRINE CBG (last 3)  Recent Labs    03/26/19 0321 03/26/19 1953 03/26/19 2323  GLUCAP 98 116* 48         IMAGING x48h  - image(s) personally visualized  -   highlighted in bold CT ANGIO HEAD W OR WO CONTRAST  Result Date: 03/26/2019 CLINICAL DATA:  Follow-up examination for acute stroke, recent ICA and MCA stenting and aneurysm coiling EXAM: CT ANGIOGRAPHY HEAD AND NECK CT PERFUSION BRAIN TECHNIQUE: Multidetector CT imaging of the head and neck was performed using the standard protocol during bolus administration of intravenous contrast. Multiplanar CT image reconstructions and MIPs were obtained to evaluate the vascular anatomy. Carotid stenosis measurements (when applicable) are obtained utilizing NASCET criteria, using the distal internal carotid diameter as the denominator. Multiphase CT imaging of the brain was performed following IV bolus contrast injection. Subsequent parametric perfusion maps were calculated using RAPID software. CONTRAST:  158mL OMNIPAQUE IOHEXOL 350 MG/ML SOLN COMPARISON:  Comparison made with prior CTA from 03/19/2019. FINDINGS: CT HEAD FINDINGS Brain: Generalized age-related cerebral  atrophy with chronic small vessel ischemic disease. No acute intracranial hemorrhage. No acute large vessel territory infarct. No mass lesion, midline shift or mass effect. No hydrocephalus. No extra-axial fluid collection. 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 Vascular stent has been placed within the left M1 segment. No hyperdense vessel. Scattered vascular calcifications noted within the carotid siphons. Skull: Scalp soft tissues within normal limits.  Calvarium intact. Sinuses/Orbits: Globes and orbital soft tissues within normal limits. Scattered mucosal thickening noted within the ethmoidal air cells and right maxillary sinus. Few small air-fluid levels noted. Mastoid air cells are clear. Patient is intubated. Other: None. CTA NECK FINDINGS Aortic arch: Visualized aortic arch of normal caliber. Bovine arch with common origin of the right brachiocephalic and left common carotid artery noted. Extensive noncalcified plaque seen throughout the visualized arch and about  the origin of the great vessels without hemodynamically significant stenosis. Irregular soft plaque and/or thrombus protruding into the lumen at the origin of the right brachiocephalic artery (series 12, image 38), unchanged. 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 Subclavian arteries otherwise irregular but patent without flow-limiting stenosis. Right carotid system: Right common carotid artery patent from its origin to the bifurcation without stenosis. Mild scattered plaque about the right bifurcation/proximal right ICA without hemodynamically significant stenosis. Right ICA irregular but patent to the skull base without stenosis, dissection, or occlusion. Left carotid system: Multifocal atheromatous irregularity within the left common carotid artery without stenosis. Interval placement of a vascular stent, proximal aspect at the origin of the left ICA. Mild-to-moderate  stenoses involving the mid and distal aspect of the stent, measuring up to approximately 50% by NASCET criteria. Widely patent flow otherwise seen through the stent. No intraluminal thrombus or other complication. Left ICA irregular but otherwise widely patent to the skull base without stenosis, dissection, or occlusion. Vertebral arteries: Both vertebral arteries arise from the subclavian arteries. Vertebral arteries remain widely patent within the neck without stenosis, dissection or occlusion. Skeleton: No acute osseous abnormality. No discrete osseous lesions. Moderate cervical spondylosis noted at C5-6. Patient is edentulous. Other neck: Endotracheal and enteric tubes in place. Postoperative changes from recent cutdown and open exposure of the left common carotid artery seen at the lower anterior left neck. Percutaneous drain remains in place within this region with associated scattered foci of soft tissue emphysema. Associated postoperative swelling and blood products present within this region as well. Small linear contrast blush within this region adjacent to the surgical drain likely reflects a small amount of persistent bleeding, likely venous in nature (series 5, image 36). No other active contrast extravasation. Upper chest: Layering bilateral pleural effusions with associated atelectasis partially visualized. Paraseptal emphysematous changes noted at the lung apices. Median sternotomy partially visualized. Review of the MIP images confirms the above findings CTA HEAD FINDINGS Anterior circulation: Petrous segments are widely patent. Scattered atherosclerotic change throughout the carotid siphons with associated moderate multifocal narrowing, unchanged. Left A1 widely patent. Hypoplastic right A1. Normal anterior communicating artery. Partially azygos ACA noted. 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 No visible neck remnant identified. Grossly patent flow through the  adjacent stent. Interval placement of a vascular stent across the previously seen severe left M1 stenosis. Patent flow is seen through the stent. Left MCA branches perfused distally. Mild stenosis involving the proximal right M1 segment noted, stable. Distal right MCA branches well perfused and stable from previous. Posterior circulation: Vertebral arteries remain widely patent to the vertebrobasilar junction. Posterior inferior cerebral arteries patent bilaterally. Basilar diffusely diminutive with associated mild multifocal narrowing. Superior cerebral arteries patent bilaterally. PCA supplied via hypoplastic P1 segments as well as robust bilateral posterior communicating arteries. Prominent atherosclerotic change throughout both PCAs with associated moderate to severe multifocal stenoses, right worse than left, grossly stable. Venous sinuses: Grossly patent allowing for timing of the contrast bolus Anatomic variants: Predominant fetal type origin of the PCAs. Hypoplastic right A1 segment. Review of the MIP images confirms the above findings CT Brain Perfusion Findings: CBF (<30%) Volume: 40mL Perfusion (Tmax>6.0s) volume: 53mL Mismatch Volume: 78mL Infarction Location:Negative CT perfusion for acute ischemia. On source perfusion maps, there is increased cerebral blood volume and cerebral blood flow with mildly decreased mean transit time and T-max, likely reflecting improved cerebrovascular flow to the left cerebral hemisphere due to interval stenting of the left ICA  and MCA. No other perfusion abnormality. IMPRESSION: CT HEAD IMPRESSION: 1. No acute intracranial abnormality. 2. 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 3. 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 CTA HEAD AND NECK IMPRESSION: 1. Negative CTA for emergent large vessel occlusion. 2. Interval stenting at the proximal left ICA and left M1 segment without complication. Patent flow seen  through both stents. 3. 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 No residual neck remnant identified. 4. Postoperative changes from interval cutdown for left carotid artery exposure/access. Surgical drain remains in place. Small blush of contrast adjacent to the drain consistent with a small focus of active bleeding, likely venous in nature. 5. Otherwise stable CTA with extensive atherosclerotic change elsewhere throughout the major arterial vasculature of the head and neck. CT PERFUSION IMPRESSION: 1. Negative CT perfusion for acute core infarct. 2. Increased cerebral blood flow and blood volume with decreased T-max within the left cerebral hemisphere, reflecting improved vascular flow to the left MCA distribution due to the left ICA and MCA stents. 3. Otherwise negative CT perfusion, with no other perfusion abnormality. These results were communicated to Dr. Lorraine Lax at 9:30 pmon 1/27/2021by text page via the [facility] messaging system. Electronically Signed   By: Jeannine Boga M.D.   On: 03/26/2019 22:49   CT ANGIO NECK W OR WO CONTRAST  Result Date: 03/26/2019 CLINICAL DATA:  Follow-up examination for acute stroke, recent ICA and MCA stenting and aneurysm coiling EXAM: CT ANGIOGRAPHY HEAD AND NECK CT PERFUSION BRAIN TECHNIQUE: Multidetector CT imaging of the head and neck was performed using the standard protocol during bolus administration of intravenous contrast. Multiplanar CT image reconstructions and MIPs were obtained to evaluate the vascular anatomy. Carotid stenosis measurements (when applicable) are obtained utilizing NASCET criteria, using the distal internal carotid diameter as the denominator. Multiphase CT imaging of the brain was performed following IV bolus contrast injection. Subsequent parametric perfusion maps were calculated using RAPID software. CONTRAST:  162mL OMNIPAQUE IOHEXOL 350 MG/ML SOLN COMPARISON:  Comparison made with prior CTA from  03/19/2019. FINDINGS: CT HEAD FINDINGS Brain: Generalized age-related cerebral atrophy with chronic small vessel ischemic disease. No acute intracranial hemorrhage. No acute large vessel territory infarct. No mass lesion, midline shift or mass effect. No hydrocephalus. No extra-axial fluid collection. 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 Vascular stent has been placed within the left M1 segment. No hyperdense vessel. Scattered vascular calcifications noted within the carotid siphons. Skull: Scalp soft tissues within normal limits.  Calvarium intact. Sinuses/Orbits: Globes and orbital soft tissues within normal limits. Scattered mucosal thickening noted within the ethmoidal air cells and right maxillary sinus. Few small air-fluid levels noted. Mastoid air cells are clear. Patient is intubated. Other: None. CTA NECK FINDINGS Aortic arch: Visualized aortic arch of normal caliber. Bovine arch with common origin of the right brachiocephalic and left common carotid artery noted. Extensive noncalcified plaque seen throughout the visualized arch and about the origin of the great vessels without hemodynamically significant stenosis. Irregular soft plaque and/or thrombus protruding into the lumen at the origin of the right brachiocephalic artery (series 12, image 38), unchanged. 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 Subclavian arteries otherwise irregular but patent without flow-limiting stenosis. Right carotid system: Right common carotid artery patent from its origin to the bifurcation without stenosis. Mild scattered plaque about the right bifurcation/proximal right ICA without hemodynamically significant stenosis. Right ICA irregular but patent to the skull  base without stenosis, dissection, or occlusion. Left carotid system: Multifocal atheromatous irregularity within the left common carotid artery without stenosis. Interval placement of a  vascular stent, proximal aspect at the origin of the left ICA. Mild-to-moderate stenoses involving the mid and distal aspect of the stent, measuring up to approximately 50% by NASCET criteria. Widely patent flow otherwise seen through the stent. No intraluminal thrombus or other complication. Left ICA irregular but otherwise widely patent to the skull base without stenosis, dissection, or occlusion. Vertebral arteries: Both vertebral arteries arise from the subclavian arteries. Vertebral arteries remain widely patent within the neck without stenosis, dissection or occlusion. Skeleton: No acute osseous abnormality. No discrete osseous lesions. Moderate cervical spondylosis noted at C5-6. Patient is edentulous. Other neck: Endotracheal and enteric tubes in place. Postoperative changes from recent cutdown and open exposure of the left common carotid artery seen at the lower anterior left neck. Percutaneous drain remains in place within this region with associated scattered foci of soft tissue emphysema. Associated postoperative swelling and blood products present within this region as well. Small linear contrast blush within this region adjacent to the surgical drain likely reflects a small amount of persistent bleeding, likely venous in nature (series 5, image 36). No other active contrast extravasation. Upper chest: Layering bilateral pleural effusions with associated atelectasis partially visualized. Paraseptal emphysematous changes noted at the lung apices. Median sternotomy partially visualized. Review of the MIP images confirms the above findings CTA HEAD FINDINGS Anterior circulation: Petrous segments are widely patent. Scattered atherosclerotic change throughout the carotid siphons with associated moderate multifocal narrowing, unchanged. Left A1 widely patent. Hypoplastic right A1. Normal anterior communicating artery. Partially azygos ACA noted. Interval stenting and coiling of previously seen pericallosal  aneurysm. No visible neck remnant identified. Grossly patent flow through the adjacent stent. Interval placement of a vascular stent across the previously seen severe left M1 stenosis. Patent flow is seen through the stent. Left MCA branches perfused distally. Mild stenosis involving the proximal right M1 segment noted, stable. Distal right MCA branches well perfused and stable from previous. Posterior circulation: Vertebral arteries remain widely patent to the vertebrobasilar junction. Posterior inferior cerebral arteries patent bilaterally. Basilar diffusely diminutive with associated mild multifocal narrowing. Superior cerebral arteries patent bilaterally. PCA supplied via hypoplastic P1 segments as well as robust bilateral posterior communicating arteries. Prominent atherosclerotic change throughout both PCAs with associated moderate to severe multifocal stenoses, right worse than left, grossly stable. Venous sinuses: Grossly patent allowing for timing of the contrast bolus Anatomic variants: Predominant fetal type origin of the PCAs. Hypoplastic right A1 segment. Review of the MIP images confirms the above findings CT Brain Perfusion Findings: CBF (<30%) Volume: 74mL Perfusion (Tmax>6.0s) volume: 14mL Mismatch Volume: 73mL Infarction Location:Negative CT perfusion for acute ischemia. On source perfusion maps, there is increased cerebral blood volume and cerebral blood flow with mildly decreased mean transit time and T-max, likely reflecting improved cerebrovascular flow to the left cerebral hemisphere due to interval stenting of the left ICA and MCA. No other perfusion abnormality. IMPRESSION: CT HEAD IMPRESSION: 1. No acute intracranial abnormality. 2. 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 3. 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 CTA HEAD AND NECK IMPRESSION: 1. Negative CTA for emergent large vessel occlusion. 2. Interval stenting at the  proximal left ICA and left M1 segment without complication. Patent flow seen through both stents. 3. 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 No residual neck remnant identified. 4.  Postoperative changes from interval cutdown for left carotid artery exposure/access. Surgical drain remains in place. Small blush of contrast adjacent to the drain consistent with a small focus of active bleeding, likely venous in nature. 5. Otherwise stable CTA with extensive atherosclerotic change elsewhere throughout the major arterial vasculature of the head and neck. CT PERFUSION IMPRESSION: 1. Negative CT perfusion for acute core infarct. 2. Increased cerebral blood flow and blood volume with decreased T-max within the left cerebral hemisphere, reflecting improved vascular flow to the left MCA distribution due to the left ICA and MCA stents. 3. Otherwise negative CT perfusion, with no other perfusion abnormality. These results were communicated to Dr. Lorraine Lax at 9:30 pmon 1/27/2021by text page via the [facility] messaging system. Electronically Signed   By: Jeannine Boga M.D.   On: 03/26/2019 22:49   CT CEREBRAL PERFUSION W CONTRAST  Result Date: 03/26/2019 CLINICAL DATA:  Follow-up examination for acute stroke, recent ICA and MCA stenting and aneurysm coiling EXAM: CT ANGIOGRAPHY HEAD AND NECK CT PERFUSION BRAIN TECHNIQUE: Multidetector CT imaging of the head and neck was performed using the standard protocol during bolus administration of intravenous contrast. Multiplanar CT image reconstructions and MIPs were obtained to evaluate the vascular anatomy. Carotid stenosis measurements (when applicable) are obtained utilizing NASCET criteria, using the distal internal carotid diameter as the denominator. Multiphase CT imaging of the brain was performed following IV bolus contrast injection. Subsequent parametric perfusion maps were calculated using RAPID software. CONTRAST:  176mL  OMNIPAQUE IOHEXOL 350 MG/ML SOLN COMPARISON:  Comparison made with prior CTA from 03/19/2019. FINDINGS: CT HEAD FINDINGS Brain: Generalized age-related cerebral atrophy with chronic small vessel ischemic disease. No acute intracranial hemorrhage. No acute large vessel territory infarct. No mass lesion, midline shift or mass effect. No hydrocephalus. No extra-axial fluid collection. 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 Vascular stent has been placed within the left M1 segment. No hyperdense vessel. Scattered vascular calcifications noted within the carotid siphons. Skull: Scalp soft tissues within normal limits.  Calvarium intact. Sinuses/Orbits: Globes and orbital soft tissues within normal limits. Scattered mucosal thickening noted within the ethmoidal air cells and right maxillary sinus. Few small air-fluid levels noted. Mastoid air cells are clear. Patient is intubated. Other: None. CTA NECK FINDINGS Aortic arch: Visualized aortic arch of normal caliber. Bovine arch with common origin of the right brachiocephalic and left common carotid artery noted. Extensive noncalcified plaque seen throughout the visualized arch and about the origin of the great vessels without hemodynamically significant stenosis. Irregular soft plaque and/or thrombus protruding into the lumen at the origin of the right brachiocephalic artery (series 12, image 38), unchanged. 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 Subclavian arteries otherwise irregular but patent without flow-limiting stenosis. Right carotid system: Right common carotid artery patent from its origin to the bifurcation without stenosis. Mild scattered plaque about the right bifurcation/proximal right ICA without hemodynamically significant stenosis. Right ICA irregular but patent to the skull base without stenosis, dissection, or occlusion. Left carotid system: Multifocal atheromatous irregularity  within the left common carotid artery without stenosis. Interval placement of a vascular stent, proximal aspect at the origin of the left ICA. Mild-to-moderate stenoses involving the mid and distal aspect of the stent, measuring up to approximately 50% by NASCET criteria. Widely patent flow otherwise seen through the stent. No intraluminal thrombus or other complication. Left ICA irregular but otherwise widely patent to the skull base without stenosis, dissection, or occlusion. Vertebral arteries: Both vertebral arteries  arise from the subclavian arteries. Vertebral arteries remain widely patent within the neck without stenosis, dissection or occlusion. Skeleton: No acute osseous abnormality. No discrete osseous lesions. Moderate cervical spondylosis noted at C5-6. Patient is edentulous. Other neck: Endotracheal and enteric tubes in place. Postoperative changes from recent cutdown and open exposure of the left common carotid artery seen at the lower anterior left neck. Percutaneous drain remains in place within this region with associated scattered foci of soft tissue emphysema. Associated postoperative swelling and blood products present within this region as well. Small linear contrast blush within this region adjacent to the surgical drain likely reflects a small amount of persistent bleeding, likely venous in nature (series 5, image 36). No other active contrast extravasation. Upper chest: Layering bilateral pleural effusions with associated atelectasis partially visualized. Paraseptal emphysematous changes noted at the lung apices. Median sternotomy partially visualized. Review of the MIP images confirms the above findings CTA HEAD FINDINGS Anterior circulation: Petrous segments are widely patent. Scattered atherosclerotic change throughout the carotid siphons with associated moderate multifocal narrowing, unchanged. Left A1 widely patent. Hypoplastic right A1. Normal anterior communicating artery. Partially  azygos ACA noted. 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 No visible neck remnant identified. Grossly patent flow through the adjacent stent. Interval placement of a vascular stent across the previously seen severe left M1 stenosis. Patent flow is seen through the stent. Left MCA branches perfused distally. Mild stenosis involving the proximal right M1 segment noted, stable. Distal right MCA branches well perfused and stable from previous. Posterior circulation: Vertebral arteries remain widely patent to the vertebrobasilar junction. Posterior inferior cerebral arteries patent bilaterally. Basilar diffusely diminutive with associated mild multifocal narrowing. Superior cerebral arteries patent bilaterally. PCA supplied via hypoplastic P1 segments as well as robust bilateral posterior communicating arteries. Prominent atherosclerotic change throughout both PCAs with associated moderate to severe multifocal stenoses, right worse than left, grossly stable. Venous sinuses: Grossly patent allowing for timing of the contrast bolus Anatomic variants: Predominant fetal type origin of the PCAs. Hypoplastic right A1 segment. Review of the MIP images confirms the above findings CT Brain Perfusion Findings: CBF (<30%) Volume: 51mL Perfusion (Tmax>6.0s) volume: 76mL Mismatch Volume: 27mL Infarction Location:Negative CT perfusion for acute ischemia. On source perfusion maps, there is increased cerebral blood volume and cerebral blood flow with mildly decreased mean transit time and T-max, likely reflecting improved cerebrovascular flow to the left cerebral hemisphere due to interval stenting of the left ICA and MCA. No other perfusion abnormality. IMPRESSION: CT HEAD IMPRESSION: 1. No acute intracranial abnormality. 2. 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 3. 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 CTA HEAD AND NECK IMPRESSION:  1. Negative CTA for emergent large vessel occlusion. 2. Interval stenting at the proximal left ICA and left M1 segment without complication. Patent flow seen through both stents. 3. 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 No residual neck remnant identified. 4. Postoperative changes from interval cutdown for left carotid artery exposure/access. Surgical drain remains in place. Small blush of contrast adjacent to the drain consistent with a small focus of active bleeding, likely venous in nature. 5. Otherwise stable CTA with extensive atherosclerotic change elsewhere throughout the major arterial vasculature of the head and neck. CT PERFUSION IMPRESSION: 1. Negative CT perfusion for acute core infarct. 2. Increased cerebral blood flow and blood volume with decreased T-max within the left cerebral hemisphere, reflecting improved vascular flow to the left MCA distribution  due to the left ICA and MCA stents. 3. Otherwise negative CT perfusion, with no other perfusion abnormality. These results were communicated to Dr. Lorraine Lax at 9:30 pmon 1/27/2021by text page via the [facility] messaging system. Electronically Signed   By: Jeannine Boga M.D.   On: 03/26/2019 22:49   DG Chest Port 1 View  Result Date: 03/26/2019 CLINICAL DATA:  Endotracheal tube placement. Hypertension. Coronary artery disease. EXAM: PORTABLE CHEST 1 VIEW COMPARISON:  03/17/2019 from [place]: Prior median sternotomy. Endotracheal tube terminates 5.5 cm above carina. Nasogastric terminates at the body of the stomach with the side port likely just at the gastroesophageal junction. Numerous leads and wires project over the chest. Cardiomegaly accentuated by AP portable technique. The apices are partially excluded. No pleural fluid. Low lung volumes with resultant pulmonary interstitial prominence. New subsegmental atelectasis at the left lung base. IMPRESSION: Nasogastric tube borderline low  in position.  Consider advancement. Otherwise, appropriate position of support apparatus. Diminished lung volumes with new left lower lobe subsegmental atelectasis. Cardiomegaly without congestive failure. Electronically Signed   By: Abigail Miyamoto M.D.   On: 03/26/2019 16:16   DG C-Arm 1-60 Min-No Report  Result Date: 03/26/2019 Fluoroscopy was utilized by the requesting physician.  No radiographic interpretation.

## 2019-03-27 NOTE — Progress Notes (Signed)
  Echocardiogram 2D Echocardiogram has been performed.  Jeremy Sherman A Gasper Hopes 03/27/2019, 1:45 PM

## 2019-03-27 NOTE — Progress Notes (Signed)
ANTICOAGULATION CONSULT NOTE  Pharmacy Consult for Heparin Indication: Left Atrial Appendage Thrombus  No Known Allergies  Patient Measurements: Height: 6\' 5"  (195.6 cm) Weight: 233 lb 14.5 oz (106.1 kg) IBW/kg (Calculated) : 89.1 Heparin Dosing Weight: 106.1 kg   Vital Signs: Temp: 97.7 F (36.5 C) (01/28 0400) Temp Source: Axillary (01/28 0400) BP: 91/55 (01/28 0500) Pulse Rate: 65 (01/28 0325)  Labs: Recent Labs    03/25/19 0225 03/25/19 0225 03/26/19 0444 03/26/19 0444 03/26/19 0737 03/26/19 0737 03/26/19 1250 03/26/19 1250 03/26/19 1648 03/27/19 0537  HGB 10.3*   < > 9.6*   < > 9.2*   < > 8.2*   < > 8.2* 8.8*  HCT 30.0*   < > 28.7*   < > 27.0*   < > 24.0*  --  24.0* 25.8*  PLT 177  --  195  --   --   --   --   --   --  173  HEPARINUNFRC 0.30  --  0.27*  --   --   --   --   --   --  0.23*  CREATININE 1.22   < > 1.19  --  1.00  --  1.00  --   --   --    < > = values in this interval not displayed.    Estimated Creatinine Clearance: 86.6 mL/min (by C-G formula based on SCr of 1 mg/dL).   Assessment: Pharmacy is consulted to dose heparin in 69 yr old male with left atrial appendage thrombus shown on TEE from 03/20/19 at [facility]. 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  S/p revascularization left ICA stenosis and left MCA stenosis by IR. Discussed with IR PA and will use our Post IR low dose tonight and reassess need for higher heparin dosing in am.   1/28 AM update:  Heparin level within therapeutic range of post-IR goal  Will likely need to increase goal at some point today (f/u with neuro/neuro IR)  Goal of Therapy:  Heparin level: 0.1-0.25 units/ml, f/u with neuro/neuro IR today to increase to higher goal Monitor platelets by anticoagulation protocol: Yes   Plan:  -Cont heparin at 750 units/hr -Confirmatory heparin level at 1200 -F/U with neuro/neuro IR regarding timing of increasing heparin level  goal  Narda Bonds, PharmD, BCPS Clinical Pharmacist Phone: [phone]

## 2019-03-27 NOTE — Progress Notes (Addendum)
Peripherally Inserted Central Catheter/Midline Placement  The IV Nurse has discussed with the patient and/or persons authorized to consent for the patient, the purpose of this procedure and the potential benefits and risks involved with this procedure.  The benefits include less needle sticks, lab draws from the catheter, and the patient may be discharged home with the catheter. Risks include, but not limited to, infection, bleeding, blood clot (thrombus formation), and puncture of an artery; nerve damage and irregular heartbeat and possibility to perform a PICC exchange if needed/ordered by physician.  Alternatives to this procedure were also discussed.  Bard Power PICC patient education guide, fact sheet on infection prevention and patient information card has been provided to patient /or left at bedside.    PICC/Midline Placement Documentation  PICC Double Lumen 03/27/19 PICC Right Brachial 39 cm 0 cm (Active)  Indication for Insertion or Continuance of Line Vasoactive infusions;Poor Vasculature-patient has had multiple peripheral attempts or PIVs lasting less than 24 hours 03/27/19 1531  Exposed Catheter (cm) 0 cm 03/27/19 1531  Site Assessment Clean;Dry;Intact 03/27/19 1531  Lumen #1 Status Flushed;Saline locked;Blood return noted 03/27/19 1531  Lumen #2 Status Flushed;Saline locked;Blood return noted 03/27/19 1531  Dressing Type Transparent;Securing device 03/27/19 1531  Dressing Status Clean;Dry;Intact;Antimicrobial disc in place 03/27/19 1531  Dressing Intervention New dressing 03/27/19 1531  Dressing Change Due 04/03/19 03/27/19 1531    Verbal consent given, patient unable to physically hold pen to sign consent.   Enos Fling 03/27/2019, 3:34 PM

## 2019-03-27 NOTE — Progress Notes (Signed)
   Doing well on SBT No neck  Hematoma of signficnatce on US neck today and CT neck yesterday and per VVS Cuff leak + RASS -1/-2 equilvant without sedation Moves all4s Answers questions Gag + No bleeding via drain  Plan Extubate    SIGNATURE    Dr. Brand Males, M.D., F.C.C.P,  Pulmonary and Critical Care Medicine Staff Physician, [place] Director - Interstitial Lung Disease  Program  [facility] [place] [facility] [place], [place], [address]  Pager: [phone], If no answer or between  15:00h - 7:00h: call [phone] Telephone: [phone]  10:43 AM 03/27/2019

## 2019-03-27 NOTE — Progress Notes (Signed)
Referring Physician(s): Dr. 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  Opens eyes and follows commands.  Tremor when moving upper extremities.  Allergies: Patient has no known allergies.  Medications: Prior to Admission medications   Medication Sig Start Date End Date Taking? Authorizing Provider  aspirin EC 81 MG tablet Take 81 mg by mouth daily.   Yes [provider]  omega-3 acid ethyl esters (LOVAZA) 1 g capsule Take 1 g by mouth daily.   Yes [provider]     Vital Signs: BP (!) 113/101   Pulse 72   Temp 98.5 F (36.9 C) (Axillary)   Resp 15   Ht 6\' 5"  (1.956 m)   Wt 233 lb 14.5 oz (106.1 kg)   SpO2 99%   BMI 27.74 kg/m   Physical Exam  Intubated, sedated. Neuro: on sedation medication, but does open eyes to voice and nod head appropriately to questions asked.  Moves all extremities.  Tremor noted in upper extremities bilaterally.   Imaging: CT ANGIO HEAD W OR WO CONTRAST  Result Date: 03/26/2019 CLINICAL DATA:  Follow-up examination for acute stroke, recent ICA and MCA stenting and aneurysm coiling EXAM: CT ANGIOGRAPHY HEAD AND NECK CT PERFUSION BRAIN TECHNIQUE: Multidetector CT imaging of the head and neck was performed using the standard protocol during bolus administration of intravenous contrast. Multiplanar CT image reconstructions and MIPs were obtained to evaluate the vascular anatomy. Carotid stenosis measurements (when applicable) are obtained utilizing NASCET criteria, using the distal internal carotid diameter as the denominator. Multiphase CT imaging of the brain was performed following IV bolus contrast injection. Subsequent parametric perfusion maps were calculated using RAPID software. CONTRAST:  155mL OMNIPAQUE IOHEXOL 350 MG/ML SOLN COMPARISON:  Comparison made with prior CTA from  03/19/2019. FINDINGS: CT HEAD FINDINGS Brain: Generalized age-related cerebral atrophy with chronic small vessel ischemic disease. No acute intracranial hemorrhage. No acute large vessel territory infarct. No mass lesion, midline shift or mass effect. No hydrocephalus. No extra-axial fluid collection. 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 Vascular stent has been placed within the left M1 segment. No hyperdense vessel. Scattered vascular calcifications noted within the carotid siphons. Skull: Scalp soft tissues within normal limits.  Calvarium intact. Sinuses/Orbits: Globes and orbital soft tissues within normal limits. Scattered mucosal thickening noted within the ethmoidal air cells and right maxillary sinus. Few small air-fluid levels noted. Mastoid air cells are clear. Patient is intubated. Other: None. CTA NECK FINDINGS Aortic arch: Visualized aortic arch of normal caliber. Bovine arch with common origin of the right brachiocephalic and left common carotid artery noted. Extensive noncalcified plaque seen throughout the visualized arch and about the origin of the great vessels without hemodynamically significant stenosis. Irregular soft plaque and/or thrombus protruding into the lumen at the origin of the right brachiocephalic artery (series 12, image 38), unchanged. 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 Subclavian arteries otherwise irregular but patent without flow-limiting stenosis. Right carotid system: Right common carotid artery patent from its origin to the bifurcation without stenosis. Mild scattered plaque about the right bifurcation/proximal right ICA without hemodynamically significant stenosis. Right ICA irregular but patent to the skull base without stenosis, dissection, or occlusion. Left carotid system: Multifocal atheromatous irregularity within the left common carotid artery without stenosis. Interval placement of a  vascular stent, proximal aspect at the  origin of the left ICA. Mild-to-moderate stenoses involving the mid and distal aspect of the stent, measuring up to approximately 50% by NASCET criteria. Widely patent flow otherwise seen through the stent. No intraluminal thrombus or other complication. Left ICA irregular but otherwise widely patent to the skull base without stenosis, dissection, or occlusion. Vertebral arteries: Both vertebral arteries arise from the subclavian arteries. Vertebral arteries remain widely patent within the neck without stenosis, dissection or occlusion. Skeleton: No acute osseous abnormality. No discrete osseous lesions. Moderate cervical spondylosis noted at C5-6. Patient is edentulous. Other neck: Endotracheal and enteric tubes in place. Postoperative changes from recent cutdown and open exposure of the left common carotid artery seen at the lower anterior left neck. Percutaneous drain remains in place within this region with associated scattered foci of soft tissue emphysema. Associated postoperative swelling and blood products present within this region as well. Small linear contrast blush within this region adjacent to the surgical drain likely reflects a small amount of persistent bleeding, likely venous in nature (series 5, image 36). No other active contrast extravasation. Upper chest: Layering bilateral pleural effusions with associated atelectasis partially visualized. Paraseptal emphysematous changes noted at the lung apices. Median sternotomy partially visualized. Review of the MIP images confirms the above findings CTA HEAD FINDINGS Anterior circulation: Petrous segments are widely patent. Scattered atherosclerotic change throughout the carotid siphons with associated moderate multifocal narrowing, unchanged. Left A1 widely patent. Hypoplastic right A1. Normal anterior communicating artery. Partially azygos ACA noted. Interval stenting and coiling of previously seen pericallosal  aneurysm. No visible neck remnant identified. Grossly patent flow through the adjacent stent. Interval placement of a vascular stent across the previously seen severe left M1 stenosis. Patent flow is seen through the stent. Left MCA branches perfused distally. Mild stenosis involving the proximal right M1 segment noted, stable. Distal right MCA branches well perfused and stable from previous. Posterior circulation: Vertebral arteries remain widely patent to the vertebrobasilar junction. Posterior inferior cerebral arteries patent bilaterally. Basilar diffusely diminutive with associated mild multifocal narrowing. Superior cerebral arteries patent bilaterally. PCA supplied via hypoplastic P1 segments as well as robust bilateral posterior communicating arteries. Prominent atherosclerotic change throughout both PCAs with associated moderate to severe multifocal stenoses, right worse than left, grossly stable. Venous sinuses: Grossly patent allowing for timing of the contrast bolus Anatomic variants: Predominant fetal type origin of the PCAs. Hypoplastic right A1 segment. Review of the MIP images confirms the above findings CT Brain Perfusion Findings: CBF (<30%) Volume: 34mL Perfusion (Tmax>6.0s) volume: 15mL Mismatch Volume: 67mL Infarction Location:Negative CT perfusion for acute ischemia. On source perfusion maps, there is increased cerebral blood volume and cerebral blood flow with mildly decreased mean transit time and T-max, likely reflecting improved cerebrovascular flow to the left cerebral hemisphere due to interval stenting of the left ICA and MCA. No other perfusion abnormality. IMPRESSION: CT HEAD IMPRESSION: 1. No acute intracranial abnormality. 2. 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 3. 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 CTA HEAD AND NECK IMPRESSION: 1. Negative CTA for emergent large vessel occlusion. 2. Interval stenting at the  proximal left ICA and left M1 segment without complication. Patent flow seen through both stents. 3. 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 No residual neck remnant identified. 4. Postoperative changes from interval cutdown for left carotid artery exposure/access. Surgical drain remains in place. Small blush of contrast adjacent to the drain consistent with a small focus of active bleeding,  likely venous in nature. 5. Otherwise stable CTA with extensive atherosclerotic change elsewhere throughout the major arterial vasculature of the head and neck. CT PERFUSION IMPRESSION: 1. Negative CT perfusion for acute core infarct. 2. Increased cerebral blood flow and blood volume with decreased T-max within the left cerebral hemisphere, reflecting improved vascular flow to the left MCA distribution due to the left ICA and MCA stents. 3. Otherwise negative CT perfusion, with no other perfusion abnormality. These results were communicated to Dr. Lorraine Lax at 9:30 pmon 1/27/2021by text page via the [facility] messaging system. Electronically Signed   By: Jeannine Boga M.D.   On: 03/26/2019 22:49   CT ANGIO NECK W OR WO CONTRAST  Result Date: 03/26/2019 CLINICAL DATA:  Follow-up examination for acute stroke, recent ICA and MCA stenting and aneurysm coiling EXAM: CT ANGIOGRAPHY HEAD AND NECK CT PERFUSION BRAIN TECHNIQUE: Multidetector CT imaging of the head and neck was performed using the standard protocol during bolus administration of intravenous contrast. Multiplanar CT image reconstructions and MIPs were obtained to evaluate the vascular anatomy. Carotid stenosis measurements (when applicable) are obtained utilizing NASCET criteria, using the distal internal carotid diameter as the denominator. Multiphase CT imaging of the brain was performed following IV bolus contrast injection. Subsequent parametric perfusion maps were calculated using RAPID software. CONTRAST:  185mL OMNIPAQUE  IOHEXOL 350 MG/ML SOLN COMPARISON:  Comparison made with prior CTA from 03/19/2019. FINDINGS: CT HEAD FINDINGS Brain: Generalized age-related cerebral atrophy with chronic small vessel ischemic disease. No acute intracranial hemorrhage. No acute large vessel territory infarct. No mass lesion, midline shift or mass effect. No hydrocephalus. No extra-axial fluid collection. 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 Vascular stent has been placed within the left M1 segment. No hyperdense vessel. Scattered vascular calcifications noted within the carotid siphons. Skull: Scalp soft tissues within normal limits.  Calvarium intact. Sinuses/Orbits: Globes and orbital soft tissues within normal limits. Scattered mucosal thickening noted within the ethmoidal air cells and right maxillary sinus. Few small air-fluid levels noted. Mastoid air cells are clear. Patient is intubated. Other: None. CTA NECK FINDINGS Aortic arch: Visualized aortic arch of normal caliber. Bovine arch with common origin of the right brachiocephalic and left common carotid artery noted. Extensive noncalcified plaque seen throughout the visualized arch and about the origin of the great vessels without hemodynamically significant stenosis. Irregular soft plaque and/or thrombus protruding into the lumen at the origin of the right brachiocephalic artery (series 12, image 38), unchanged. 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 Subclavian arteries otherwise irregular but patent without flow-limiting stenosis. Right carotid system: Right common carotid artery patent from its origin to the bifurcation without stenosis. Mild scattered plaque about the right bifurcation/proximal right ICA without hemodynamically significant stenosis. Right ICA irregular but patent to the skull base without stenosis, dissection, or occlusion. Left carotid system: Multifocal atheromatous irregularity within the  left common carotid artery without stenosis. Interval placement of a vascular stent, proximal aspect at the origin of the left ICA. Mild-to-moderate stenoses involving the mid and distal aspect of the stent, measuring up to approximately 50% by NASCET criteria. Widely patent flow otherwise seen through the stent. No intraluminal thrombus or other complication. Left ICA irregular but otherwise widely patent to the skull base without stenosis, dissection, or occlusion. Vertebral arteries: Both vertebral arteries arise from the subclavian arteries. Vertebral arteries remain widely patent within the neck without stenosis, dissection or occlusion. Skeleton: No acute osseous abnormality. No discrete osseous lesions. Moderate cervical  spondylosis noted at C5-6. Patient is edentulous. Other neck: Endotracheal and enteric tubes in place. Postoperative changes from recent cutdown and open exposure of the left common carotid artery seen at the lower anterior left neck. Percutaneous drain remains in place within this region with associated scattered foci of soft tissue emphysema. Associated postoperative swelling and blood products present within this region as well. Small linear contrast blush within this region adjacent to the surgical drain likely reflects a small amount of persistent bleeding, likely venous in nature (series 5, image 36). No other active contrast extravasation. Upper chest: Layering bilateral pleural effusions with associated atelectasis partially visualized. Paraseptal emphysematous changes noted at the lung apices. Median sternotomy partially visualized. Review of the MIP images confirms the above findings CTA HEAD FINDINGS Anterior circulation: Petrous segments are widely patent. Scattered atherosclerotic change throughout the carotid siphons with associated moderate multifocal narrowing, unchanged. Left A1 widely patent. Hypoplastic right A1. Normal anterior communicating artery. Partially azygos ACA  noted. 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 No visible neck remnant identified. Grossly patent flow through the adjacent stent. Interval placement of a vascular stent across the previously seen severe left M1 stenosis. Patent flow is seen through the stent. Left MCA branches perfused distally. Mild stenosis involving the proximal right M1 segment noted, stable. Distal right MCA branches well perfused and stable from previous. Posterior circulation: Vertebral arteries remain widely patent to the vertebrobasilar junction. Posterior inferior cerebral arteries patent bilaterally. Basilar diffusely diminutive with associated mild multifocal narrowing. Superior cerebral arteries patent bilaterally. PCA supplied via hypoplastic P1 segments as well as robust bilateral posterior communicating arteries. Prominent atherosclerotic change throughout both PCAs with associated moderate to severe multifocal stenoses, right worse than left, grossly stable. Venous sinuses: Grossly patent allowing for timing of the contrast bolus Anatomic variants: Predominant fetal type origin of the PCAs. Hypoplastic right A1 segment. Review of the MIP images confirms the above findings CT Brain Perfusion Findings: CBF (<30%) Volume: 51mL Perfusion (Tmax>6.0s) volume: 56mL Mismatch Volume: 62mL Infarction Location:Negative CT perfusion for acute ischemia. On source perfusion maps, there is increased cerebral blood volume and cerebral blood flow with mildly decreased mean transit time and T-max, likely reflecting improved cerebrovascular flow to the left cerebral hemisphere due to interval stenting of the left ICA and MCA. No other perfusion abnormality. IMPRESSION: CT HEAD IMPRESSION: 1. No acute intracranial abnormality. 2. 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 3. 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 CTA HEAD AND NECK IMPRESSION: 1. Negative  CTA for emergent large vessel occlusion. 2. Interval stenting at the proximal left ICA and left M1 segment without complication. Patent flow seen through both stents. 3. 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 No residual neck remnant identified. 4. Postoperative changes from interval cutdown for left carotid artery exposure/access. Surgical drain remains in place. Small blush of contrast adjacent to the drain consistent with a small focus of active bleeding, likely venous in nature. 5. Otherwise stable CTA with extensive atherosclerotic change elsewhere throughout the major arterial vasculature of the head and neck. CT PERFUSION IMPRESSION: 1. Negative CT perfusion for acute core infarct. 2. Increased cerebral blood flow and blood volume with decreased T-max within the left cerebral hemisphere, reflecting improved vascular flow to the left MCA distribution due to the left ICA and MCA stents. 3. Otherwise negative CT perfusion, with no other perfusion abnormality. These results were communicated to Dr. Lorraine Lax at 9:30 pmon 1/27/2021by  text page via the [facility] messaging system. Electronically Signed   By: Jeannine Boga M.D.   On: 03/26/2019 22:49   US RENAL  Result Date: 03/24/2019 CLINICAL DATA:  Elevated BUN and creatinine EXAM: RENAL / URINARY TRACT ULTRASOUND COMPLETE COMPARISON:  None. FINDINGS: Right Kidney: Renal measurements: 10.4 x 5.4 x 5.4 cm = volume: 158 mL. 1.4 cm midpole cyst. Normal echotexture. No suspicious mass or hydronephrosis. Left Kidney: Renal measurements: 11.1 x 6.4 x 4.8 cm = volume: 179 mL. Echogenicity within normal limits. No mass or hydronephrosis visualized. Bladder: Decompressed with Foley catheter in place. Other: None. IMPRESSION: No acute findings.  No hydronephrosis. Electronically Signed   By: Rolm Baptise M.D.   On: 03/24/2019 21:58   CT CEREBRAL PERFUSION W CONTRAST  Result Date: 03/26/2019 CLINICAL DATA:  Follow-up  examination for acute stroke, recent ICA and MCA stenting and aneurysm coiling EXAM: CT ANGIOGRAPHY HEAD AND NECK CT PERFUSION BRAIN TECHNIQUE: Multidetector CT imaging of the head and neck was performed using the standard protocol during bolus administration of intravenous contrast. Multiplanar CT image reconstructions and MIPs were obtained to evaluate the vascular anatomy. Carotid stenosis measurements (when applicable) are obtained utilizing NASCET criteria, using the distal internal carotid diameter as the denominator. Multiphase CT imaging of the brain was performed following IV bolus contrast injection. Subsequent parametric perfusion maps were calculated using RAPID software. CONTRAST:  144mL OMNIPAQUE IOHEXOL 350 MG/ML SOLN COMPARISON:  Comparison made with prior CTA from 03/19/2019. FINDINGS: CT HEAD FINDINGS Brain: Generalized age-related cerebral atrophy with chronic small vessel ischemic disease. No acute intracranial hemorrhage. No acute large vessel territory infarct. No mass lesion, midline shift or mass effect. No hydrocephalus. No extra-axial fluid collection. 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 Vascular stent has been placed within the left M1 segment. No hyperdense vessel. Scattered vascular calcifications noted within the carotid siphons. Skull: Scalp soft tissues within normal limits.  Calvarium intact. Sinuses/Orbits: Globes and orbital soft tissues within normal limits. Scattered mucosal thickening noted within the ethmoidal air cells and right maxillary sinus. Few small air-fluid levels noted. Mastoid air cells are clear. Patient is intubated. Other: None. CTA NECK FINDINGS Aortic arch: Visualized aortic arch of normal caliber. Bovine arch with common origin of the right brachiocephalic and left common carotid artery noted. Extensive noncalcified plaque seen throughout the visualized arch and about the origin of the great vessels without  hemodynamically significant stenosis. Irregular soft plaque and/or thrombus protruding into the lumen at the origin of the right brachiocephalic artery (series 12, image 38), unchanged. 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 Subclavian arteries otherwise irregular but patent without flow-limiting stenosis. Right carotid system: Right common carotid artery patent from its origin to the bifurcation without stenosis. Mild scattered plaque about the right bifurcation/proximal right ICA without hemodynamically significant stenosis. Right ICA irregular but patent to the skull base without stenosis, dissection, or occlusion. Left carotid system: Multifocal atheromatous irregularity within the left common carotid artery without stenosis. Interval placement of a vascular stent, proximal aspect at the origin of the left ICA. Mild-to-moderate stenoses involving the mid and distal aspect of the stent, measuring up to approximately 50% by NASCET criteria. Widely patent flow otherwise seen through the stent. No intraluminal thrombus or other complication. Left ICA irregular but otherwise widely patent to the skull base without stenosis, dissection, or occlusion. Vertebral arteries: Both vertebral arteries arise from the subclavian arteries. Vertebral arteries remain widely patent within the neck without stenosis,  dissection or occlusion. Skeleton: No acute osseous abnormality. No discrete osseous lesions. Moderate cervical spondylosis noted at C5-6. Patient is edentulous. Other neck: Endotracheal and enteric tubes in place. Postoperative changes from recent cutdown and open exposure of the left common carotid artery seen at the lower anterior left neck. Percutaneous drain remains in place within this region with associated scattered foci of soft tissue emphysema. Associated postoperative swelling and blood products present within this region as well. Small linear contrast blush within this  region adjacent to the surgical drain likely reflects a small amount of persistent bleeding, likely venous in nature (series 5, image 36). No other active contrast extravasation. Upper chest: Layering bilateral pleural effusions with associated atelectasis partially visualized. Paraseptal emphysematous changes noted at the lung apices. Median sternotomy partially visualized. Review of the MIP images confirms the above findings CTA HEAD FINDINGS Anterior circulation: Petrous segments are widely patent. Scattered atherosclerotic change throughout the carotid siphons with associated moderate multifocal narrowing, unchanged. Left A1 widely patent. Hypoplastic right A1. Normal anterior communicating artery. Partially azygos ACA noted. 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 No visible neck remnant identified. Grossly patent flow through the adjacent stent. Interval placement of a vascular stent across the previously seen severe left M1 stenosis. Patent flow is seen through the stent. Left MCA branches perfused distally. Mild stenosis involving the proximal right M1 segment noted, stable. Distal right MCA branches well perfused and stable from previous. Posterior circulation: Vertebral arteries remain widely patent to the vertebrobasilar junction. Posterior inferior cerebral arteries patent bilaterally. Basilar diffusely diminutive with associated mild multifocal narrowing. Superior cerebral arteries patent bilaterally. PCA supplied via hypoplastic P1 segments as well as robust bilateral posterior communicating arteries. Prominent atherosclerotic change throughout both PCAs with associated moderate to severe multifocal stenoses, right worse than left, grossly stable. Venous sinuses: Grossly patent allowing for timing of the contrast bolus Anatomic variants: Predominant fetal type origin of the PCAs. Hypoplastic right A1 segment. Review of the MIP images confirms the above findings CT Brain  Perfusion Findings: CBF (<30%) Volume: 90mL Perfusion (Tmax>6.0s) volume: 76mL Mismatch Volume: 45mL Infarction Location:Negative CT perfusion for acute ischemia. On source perfusion maps, there is increased cerebral blood volume and cerebral blood flow with mildly decreased mean transit time and T-max, likely reflecting improved cerebrovascular flow to the left cerebral hemisphere due to interval stenting of the left ICA and MCA. No other perfusion abnormality. IMPRESSION: CT HEAD IMPRESSION: 1. No acute intracranial abnormality. 2. 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 3. 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 CTA HEAD AND NECK IMPRESSION: 1. Negative CTA for emergent large vessel occlusion. 2. Interval stenting at the proximal left ICA and left M1 segment without complication. Patent flow seen through both stents. 3. 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 No residual neck remnant identified. 4. Postoperative changes from interval cutdown for left carotid artery exposure/access. Surgical drain remains in place. Small blush of contrast adjacent to the drain consistent with a small focus of active bleeding, likely venous in nature. 5. Otherwise stable CTA with extensive atherosclerotic change elsewhere throughout the major arterial vasculature of the head and neck. CT PERFUSION IMPRESSION: 1. Negative CT perfusion for acute core infarct. 2. Increased cerebral blood flow and blood volume with decreased T-max within the left cerebral hemisphere, reflecting improved vascular flow to the left MCA distribution due to the left ICA and MCA stents. 3. Otherwise negative CT perfusion, with no  other perfusion abnormality. These results were communicated to Dr. Lorraine Lax at 9:30 pmon 1/27/2021by text page via the [facility] messaging system. Electronically Signed   By: Jeannine Boga M.D.   On: 03/26/2019 22:49    DG Chest Port 1 View  Result Date: 03/26/2019 CLINICAL DATA:  Endotracheal tube placement. Hypertension. Coronary artery disease. EXAM: PORTABLE CHEST 1 VIEW COMPARISON:  03/17/2019 from [place]: Prior median sternotomy. Endotracheal tube terminates 5.5 cm above carina. Nasogastric terminates at the body of the stomach with the side port likely just at the gastroesophageal junction. Numerous leads and wires project over the chest. Cardiomegaly accentuated by AP portable technique. The apices are partially excluded. No pleural fluid. Low lung volumes with resultant pulmonary interstitial prominence. New subsegmental atelectasis at the left lung base. IMPRESSION: Nasogastric tube borderline low in position.  Consider advancement. Otherwise, appropriate position of support apparatus. Diminished lung volumes with new left lower lobe subsegmental atelectasis. Cardiomegaly without congestive failure. Electronically Signed   By: Abigail Miyamoto M.D.   On: 03/26/2019 16:16   DG Abd 2 Views  Result Date: 03/23/2019 CLINICAL DATA:  Patient status post stroke.  Abdominal distension. EXAM: ABDOMEN - 2 VIEW COMPARISON:  None. FINDINGS: The bowel gas pattern is normal. There is no evidence of free air. No radio-opaque calculi or other significant radiographic abnormality is seen. Contrast in a distended urinary bladder from the patient's angiogram is noted. IMPRESSION: Normal bowel gas pattern. Distended urinary bladder. Electronically Signed   By: Inge Rise M.D.   On: 03/23/2019 11:24   DG C-Arm 1-60 Min-No Report  Result Date: 03/26/2019 Fluoroscopy was utilized by the requesting physician.  No radiographic interpretation.   VAS US CAROTID  Result Date: 03/27/2019 Carotid Arterial Duplex Study Indications:       CVA, Syncope, Left stent and possible hematoma. Risk Factors:      Hypertension, current smoker, coronary artery disease. Limitations        Today's exam was limited due to the  post surgical status of                    the patient, patient on a ventilator and movement. Comparison Study:  No prior study on file for comparison. Performing Technologist: Sharion Dove RVS  Examination Guidelines: A complete evaluation includes B-mode imaging, spectral Doppler, color Doppler, and power Doppler as needed of all accessible portions of each vessel. Bilateral testing is considered an integral part of a complete examination. Limited examinations for reoccurring indications may be performed as noted.  Left Carotid Findings: +----------+--------+--------+--------+------------------+------------------+           PSV cm/sEDV cm/sStenosisPlaque DescriptionComments           +----------+--------+--------+--------+------------------+------------------+ CCA Prox  94      19                                intimal thickening +----------+--------+--------+--------+------------------+------------------+ CCA Distal81      15                                intimal thickening +----------+--------+--------+--------+------------------+------------------+ ICA Prox                                            stent              +----------+--------+--------+--------+------------------+------------------+  ICA Distal52      22                                                   +----------+--------+--------+--------+------------------+------------------+ ECA       55      12                                                   +----------+--------+--------+--------+------------------+------------------+ +----------+--------+--------+--------------+-------------------+           PSV cm/sEDV cm/sDescribe      Arm Pressure (mmHG) +----------+--------+--------+--------------+-------------------+ Subclavian                Not identified                    +----------+--------+--------+--------------+-------------------+ +---------+--------+--------+--------------+  VertebralPSV cm/sEDV cm/sNot identified +---------+--------+--------+--------------+  Left Stent(s): +--------------+--+--++++ Prox to Stent 6211 +--------------+--+--++++ Proximal Stent8032 +--------------+--+--++++ Mid Stent     7227 +--------------+--+--++++    Summary:  Left Carotid: Patent stent. 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 *See table(s) above for measurements and observations.     Preliminary     Labs:  CBC: Recent Labs    03/24/19 0238 03/24/19 0238 03/25/19 0225 03/25/19 0225 03/26/19 0444 03/26/19 0737 03/26/19 1250 03/26/19 1406 03/26/19 1648 03/27/19 0537  WBC 4.5  --  4.8  --  4.7  --   --   --   --  6.3  HGB 11.4*   < > 10.3*   < > 9.6*   < > 8.2* 8.5* 8.2* 8.8*  HCT 33.2*   < > 30.0*   < > 28.7*   < > 24.0* 25.0* 24.0* 25.8*  PLT 194  --  177  --  195  --   --   --   --  173   < > = values in this interval not displayed.    COAGS: Recent Labs    03/21/19 0413  INR 1.0    BMP: Recent Labs    03/24/19 0238 03/24/19 0238 03/25/19 0225 03/25/19 0225 03/26/19 0444 03/26/19 0444 03/26/19 0737 03/26/19 0737 03/26/19 1250 03/26/19 1406 03/26/19 1648 03/27/19 0537  NA 136   < > 135   < > 134*   < > 138   < > 136 138 136 137  K 3.8   < > 3.5   < > 3.4*   < > 3.4*   < > 3.9 3.9 3.8 3.8  CL 102   < > 104   < > 103  --  105  --  104  --   --  109  CO2 22  --  21*  --  22  --   --   --   --   --   --  20*  GLUCOSE 109*   < > 106*   < > 105*  --  96  --  136*  --   --  121*  BUN 18   < > 14   < > 11  --  10  --  10  --   --  9  CALCIUM 8.5*  --  8.4*  --  8.4*  --   --   --   --   --   --  7.8*  CREATININE 1.52*   < > 1.22   < > 1.19  --  1.00  --  1.00  --   --  0.96  GFRNONAA 46*  --  60*  --  >60  --   --   --   --   --   --  >60  GFRAA 53*  --  >60  --  >60  --   --   --   --   --   --  >60   < > = values in this interval not displayed.    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  CT performed overnight and reviewed by Dr. Estanislado Pandy.  No acute findings.  Patient remains intubated and sedated overnight for protection of carotid access site. Appreciate VS assistance.  Remains intubated this AM.  Carotid site stable.  Drain remains in place. Per VS note this AM, to remain in place for additional 24 hrs.  US Carotid this AM to assess for hematoma.  Brilinta 90mg  BID P2Y12 ordered.  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

## 2019-03-27 NOTE — Progress Notes (Signed)
ANTICOAGULATION CONSULT NOTE  Pharmacy Consult for Heparin Indication: Left Atrial Appendage Thrombus  No Known Allergies  Patient Measurements: Height: 6\' 5"  (195.6 cm) Weight: 233 lb 14.5 oz (106.1 kg) IBW/kg (Calculated) : 89.1 Heparin Dosing Weight: 106.1 kg   Vital Signs: Temp: 99.8 F (37.7 C) (01/28 2000) Temp Source: Oral (01/28 2000) BP: 122/54 (01/28 1400) Pulse Rate: 76 (01/28 2145)  Labs: Recent Labs    03/25/19 0225 03/25/19 0225 03/26/19 0444 03/26/19 0444 03/26/19 0737 03/26/19 0737 03/26/19 1250 03/26/19 1250 03/26/19 1406 03/26/19 1406 03/26/19 1648 03/27/19 0537 03/27/19 1338 03/27/19 2201  HGB 10.3*   < > 9.6*   < > 9.2*   < > 8.2*   < > 8.5*   < > 8.2* 8.8*  --   --   HCT 30.0*   < > 28.7*   < > 27.0*   < > 24.0*   < > 25.0*  --  24.0* 25.8*  --   --   PLT 177  --  195  --   --   --   --   --   --   --   --  173  --   --   HEPARINUNFRC 0.30   < > 0.27*   < >  --   --   --   --   --   --   --  0.23* 0.24* 0.31  CREATININE 1.22   < > 1.19   < > 1.00  --  1.00  --   --   --   --  0.96  --   --    < > = values in this interval not displayed.    Estimated Creatinine Clearance: 90.2 mL/min (by C-G formula based on SCr of 0.96 mg/dL).   Assessment: Pharmacy is consulted to dose heparin in 69 yr old male with left atrial appendage thrombus shown on TEE from 03/20/19 at [facility]. 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  S/p revascularization left ICA stenosis and left MCA stenosis by IR.  -heparin level= 0.31  Goal of Therapy:  Heparin level: 0.3-0.5 units/ml Monitor platelets by anticoagulation protocol: Yes   Plan:  -No heparin changes needed - Monitor daily heparin level and CBC  Hildred Laser, PharmD Clinical Pharmacist **Pharmacist phone directory can now be found on [email or web address] ([email or web address]).  Listed under [place].

## 2019-03-27 NOTE — Progress Notes (Signed)
eLink Physician-Brief Progress Note Patient Name: Jeremy Sherman DOB: [birth year] MRN: [id number]   Date of Service  03/27/2019  HPI/Events of Note  RN called with noticed some blood tinged urine in cath bag, no bleeding at urethra or frank red blood. Hgb improved and plt normal . B/p stable . O2 sats 100%. Patient is on Hep IV (and Brilinta s/p IR coiling /stent of MCA  And sub acute CVA , left atrial thrombus . B/p stable ,   eICU Interventions  Monitor for now . Check cbc in am . Reassess urine/cath in am .      Intervention Category Minor Interventions: Other:  Shloma Roggenkamp 03/27/2019, 10:23 PM

## 2019-03-27 NOTE — Progress Notes (Signed)
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 s/p arteriogram which additionally reveals L ACA pericallosal aneurysm. Pt now s/p L common carotid arteriogram with angioplasty of L MCA M1 segment, L ACA pericallosal aneurysm coiling, and L ICA stenting. Extubated 03/27/19.   OT comments  Pt seen for OT re-evaluation following removal of aneurysm and s/p extubation. Pt is formerly independent and still working. At time of re eval, he presents very fatigued and now max A +2 for bed mobility when he was able to complete functional mobility around the unit without AD at min guard level on 1/23. Further transfer deferred per MD considering carotid drain still present at this time. Pt continues to present with cognitive deficits noted in awareness, safety awareness, and problem solving/processing. Given this change in functional level, recommend CIR prior to d/c home for intensive facilitation of BADL/IADL for safety and fall reduction. Will continue to follow per POC listed below. Goals updated to reflect progress and current status.    Follow Up Recommendations  CIR;Supervision/Assistance - 24 hour    Equipment Recommendations  Tub/shower bench    Recommendations for Other Services      Precautions / Restrictions Precautions Precautions: Fall Restrictions Weight Bearing Restrictions: No       Mobility Bed Mobility Overal bed mobility: Needs Assistance Bed Mobility: Supine to Sit;Sit to Supine     Supine to sit: Max assist;+2 for physical assistance;+2 for safety/equipment Sit to supine: Max assist;+2 for physical assistance;+2 for safety/equipment   General bed mobility  comments: max A +2 for supine <> sit to EOB. Cueing and assist needed at [place] and assist for trunk elevation  Transfers                 General transfer comment: not tested this date due to MD request to hold while carotid drain in place    Balance Overall balance assessment: Needs assistance Sitting-balance support: Feet supported;No upper extremity supported Sitting balance-Leahy Scale: Poor Sitting balance - Comments: fluctuating support while sitting EOB, overall min A                                   ADL either performed or assessed with clinical judgement   ADL Overall ADL's : Needs assistance/impaired Eating/Feeding: NPO   Grooming: Minimal assistance;Sitting   Upper Body Bathing: Moderate assistance;Sitting   Lower Body Bathing: Maximal assistance;Sit to/from stand   Upper Body Dressing : Moderate assistance;Sitting   Lower Body Dressing: Maximal assistance;Sit to/from stand                 General ADL Comments: transfers not attempted 2/2 carotid drain in place (MD request) pt with significant status change from original eval     Vision   Vision Assessment?: No apparent visual deficits   Perception     Praxis      Cognition Arousal/Alertness: Suspect due to medications;Lethargic Behavior During Therapy: Flat affect Overall Cognitive Status: Impaired/Different from baseline Area of Impairment: Safety/judgement;Problem solving;Awareness;Following commands                       Following Commands: Follows one step commands consistently;Follows one step  commands with increased time Safety/Judgement: Decreased awareness of safety Awareness: Intellectual Problem Solving: Difficulty sequencing;Requires verbal cues;Slow processing;Requires tactile cues General Comments: pt presenting with status change from initial eval, much more lethargic this date- stating "there ya go" to many statements when not always appropriate.  Decreased overall awareness of deficits        Exercises     Shoulder Instructions       General Comments      Pertinent Vitals/ Pain       Pain Assessment: Faces Faces Pain Scale: Hurts a little bit Pain Location: generalized Pain Descriptors / Indicators: Aching Pain Intervention(s): Limited activity within patient's tolerance;Monitored during session;Repositioned  Home Living                                          Prior Functioning/Environment              Frequency  Min 2X/week        Progress Toward Goals  OT Goals(current goals can now be found in the care plan section)  Progress towards OT goals: Not progressing toward goals - comment(change in status with goal update)  Acute Rehab OT Goals Patient Stated Goal: return to home and work OT Goal Formulation: With patient Time For Goal Achievement: 04/05/19 Potential to Achieve Goals: Good  Plan Discharge plan needs to be updated    Co-evaluation    PT/OT/SLP Co-Evaluation/Treatment: Yes Reason for Co-Treatment: For patient/therapist safety;To address functional/ADL transfers PT goals addressed during session: Mobility/safety with mobility;Balance;Strengthening/ROM OT goals addressed during session: ADL's and self-care;Strengthening/ROM      AM-PAC OT "6 Clicks" Daily Activity     Outcome Measure   Help from another person eating meals?: Total(NPO) Help from another person taking care of personal grooming?: A Little Help from another person toileting, which includes using toliet, bedpan, or urinal?: A Lot Help from another person bathing (including washing, rinsing, drying)?: A Lot Help from another person to put on and taking off regular upper body clothing?: A Lot Help from another person to put on and taking off regular lower body clothing?: A Lot 6 Click Score: 12    End of Session    OT Visit Diagnosis: Unsteadiness on feet (R26.81);Other abnormalities of gait and  mobility (R26.89);Muscle weakness (generalized) (M62.81);Other symptoms and signs involving cognitive function   Activity Tolerance Patient limited by fatigue   Patient Left in bed;with call bell/phone within reach;with bed alarm set   Nurse Communication Mobility status        Time: [id number] OT Time Calculation (min): 17 min  Charges: OT General Charges $OT Visit: 1 Visit OT Evaluation $OT Re-eval: 1 Re-eval  Zenovia Jarred, MSOT, OTR/L Acute Rehabilitation Services [facility] Office Number: [phone]  Zenovia Jarred 03/27/2019, 5:34 PM

## 2019-03-27 NOTE — Progress Notes (Signed)
Extubated today, no neurologic deficits, moving all extremities.  Left neck unchanged, small soft hematoma at left carotid exposure - minimal output from drain.  Will d/c drain tomorrow.  Marty Heck, MD [facility] Office: [phone] Pager: [place]

## 2019-03-27 NOTE — Progress Notes (Signed)
STROKE TEAM PROGRESS NOTE   INTERVAL HISTORY RN at bedside. Pt still intubated, on weaning, just off sedation, open eyes and following simple commands.  Moving all extremities bilaterally symmetrical.  Had one episode last night with right-sided weakness, had a CT and CT head and neck which unremarkable.  Shortly after right-sided weakness resolved.  Not sure if it is related to sedation or TIA.  But this morning patient muscle strength remained symmetrical bilaterally.  Vitals:   03/27/19 0600 03/27/19 0700 03/27/19 0753 03/27/19 0800  BP: 127/65 (!) 110/55  131/85  Pulse:      Resp: 13 15 20 16   Temp:    98.5 F (36.9 C)  TempSrc:    Axillary  SpO2: 100%  100%   Weight:      Height:        CBC:  Recent Labs  Lab 03/26/19 0444 03/26/19 0737 03/26/19 1648 03/27/19 0537  WBC 4.7  --   --  6.3  NEUTROABS  --   --   --  4.8  HGB 9.6*   < > 8.2* 8.8*  HCT 28.7*   < > 24.0* 25.8*  MCV 94.4  --   --  93.5  PLT 195  --   --  173   < > = values in this interval not displayed.    Basic Metabolic Panel:  Recent Labs  Lab 03/26/19 0444 03/26/19 0737 03/26/19 1250 03/26/19 1406 03/26/19 1648 03/27/19 0537  NA 134*   < > 136   < > 136 137  K 3.4*   < > 3.9   < > 3.8 3.8  CL 103   < > 104  --   --  109  CO2 22  --   --   --   --  20*  GLUCOSE 105*   < > 136*  --   --  121*  BUN 11   < > 10  --   --  9  CREATININE 1.19   < > 1.00  --   --  0.96  CALCIUM 8.4*  --   --   --   --  7.8*   < > = values in this interval not displayed.   Lipid Panel:     Component Value Date/Time   CHOL 243 (H) 03/21/2019 0413   TRIG 219 (H) 03/27/2019 0537   HDL 37 (L) 03/21/2019 0413   CHOLHDL 6.6 03/21/2019 0413   VLDL 29 03/21/2019 0413   LDLCALC 177 (H) 03/21/2019 0413   HgbA1c:  Lab Results  Component Value Date   HGBA1C 6.4 (H) 03/21/2019   Urine Drug Screen: No results found for: LABOPIA, COCAINSCRNUR, LABBENZ, AMPHETMU, THCU, LABBARB  Alcohol Level No results found for:  ETH  IMAGING past 48 hours CT ANGIO HEAD W OR WO CONTRAST  Result Date: 03/26/2019 CLINICAL DATA:  Follow-up examination for acute stroke, recent ICA and MCA stenting and aneurysm coiling EXAM: CT ANGIOGRAPHY HEAD AND NECK CT PERFUSION BRAIN TECHNIQUE: Multidetector CT imaging of the head and neck was performed using the standard protocol during bolus administration of intravenous contrast. Multiplanar CT image reconstructions and MIPs were obtained to evaluate the vascular anatomy. Carotid stenosis measurements (when applicable) are obtained utilizing NASCET criteria, using the distal internal carotid diameter as the denominator. Multiphase CT imaging of the brain was performed following IV bolus contrast injection. Subsequent parametric perfusion maps were calculated using RAPID software. CONTRAST:  148mL OMNIPAQUE IOHEXOL 350 MG/ML SOLN COMPARISON:  Comparison made with prior CTA from 03/19/2019. FINDINGS: CT HEAD FINDINGS Brain: Generalized age-related cerebral atrophy with chronic small vessel ischemic disease. No acute intracranial hemorrhage. No acute large vessel territory infarct. No mass lesion, midline shift or mass effect. No hydrocephalus. No extra-axial fluid collection. 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 Vascular stent has been placed within the left M1 segment. No hyperdense vessel. Scattered vascular calcifications noted within the carotid siphons. Skull: Scalp soft tissues within normal limits.  Calvarium intact. Sinuses/Orbits: Globes and orbital soft tissues within normal limits. Scattered mucosal thickening noted within the ethmoidal air cells and right maxillary sinus. Few small air-fluid levels noted. Mastoid air cells are clear. Patient is intubated. Other: None. CTA NECK FINDINGS Aortic arch: Visualized aortic arch of normal caliber. Bovine arch with common origin of the right brachiocephalic and left common carotid artery noted. Extensive  noncalcified plaque seen throughout the visualized arch and about the origin of the great vessels without hemodynamically significant stenosis. Irregular soft plaque and/or thrombus protruding into the lumen at the origin of the right brachiocephalic artery (series 12, image 38), unchanged. 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 Subclavian arteries otherwise irregular but patent without flow-limiting stenosis. Right carotid system: Right common carotid artery patent from its origin to the bifurcation without stenosis. Mild scattered plaque about the right bifurcation/proximal right ICA without hemodynamically significant stenosis. Right ICA irregular but patent to the skull base without stenosis, dissection, or occlusion. Left carotid system: Multifocal atheromatous irregularity within the left common carotid artery without stenosis. Interval placement of a vascular stent, proximal aspect at the origin of the left ICA. Mild-to-moderate stenoses involving the mid and distal aspect of the stent, measuring up to approximately 50% by NASCET criteria. Widely patent flow otherwise seen through the stent. No intraluminal thrombus or other complication. Left ICA irregular but otherwise widely patent to the skull base without stenosis, dissection, or occlusion. Vertebral arteries: Both vertebral arteries arise from the subclavian arteries. Vertebral arteries remain widely patent within the neck without stenosis, dissection or occlusion. Skeleton: No acute osseous abnormality. No discrete osseous lesions. Moderate cervical spondylosis noted at C5-6. Patient is edentulous. Other neck: Endotracheal and enteric tubes in place. Postoperative changes from recent cutdown and open exposure of the left common carotid artery seen at the lower anterior left neck. Percutaneous drain remains in place within this region with associated scattered foci of soft tissue emphysema. Associated postoperative  swelling and blood products present within this region as well. Small linear contrast blush within this region adjacent to the surgical drain likely reflects a small amount of persistent bleeding, likely venous in nature (series 5, image 36). No other active contrast extravasation. Upper chest: Layering bilateral pleural effusions with associated atelectasis partially visualized. Paraseptal emphysematous changes noted at the lung apices. Median sternotomy partially visualized. Review of the MIP images confirms the above findings CTA HEAD FINDINGS Anterior circulation: Petrous segments are widely patent. Scattered atherosclerotic change throughout the carotid siphons with associated moderate multifocal narrowing, unchanged. Left A1 widely patent. Hypoplastic right A1. Normal anterior communicating artery. Partially azygos ACA noted. 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 No visible neck remnant identified. Grossly patent flow through the adjacent stent. Interval placement of a vascular stent across the previously seen severe left M1 stenosis. Patent flow is seen through the stent. Left MCA branches perfused distally. Mild stenosis involving the proximal right M1 segment noted, stable. Distal right MCA branches well perfused and stable  from previous. Posterior circulation: Vertebral arteries remain widely patent to the vertebrobasilar junction. Posterior inferior cerebral arteries patent bilaterally. Basilar diffusely diminutive with associated mild multifocal narrowing. Superior cerebral arteries patent bilaterally. PCA supplied via hypoplastic P1 segments as well as robust bilateral posterior communicating arteries. Prominent atherosclerotic change throughout both PCAs with associated moderate to severe multifocal stenoses, right worse than left, grossly stable. Venous sinuses: Grossly patent allowing for timing of the contrast bolus Anatomic variants: Predominant fetal type origin  of the PCAs. Hypoplastic right A1 segment. Review of the MIP images confirms the above findings CT Brain Perfusion Findings: CBF (<30%) Volume: 37mL Perfusion (Tmax>6.0s) volume: 46mL Mismatch Volume: 49mL Infarction Location:Negative CT perfusion for acute ischemia. On source perfusion maps, there is increased cerebral blood volume and cerebral blood flow with mildly decreased mean transit time and T-max, likely reflecting improved cerebrovascular flow to the left cerebral hemisphere due to interval stenting of the left ICA and MCA. No other perfusion abnormality. IMPRESSION: CT HEAD IMPRESSION: 1. No acute intracranial abnormality. 2. 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 3. 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 CTA HEAD AND NECK IMPRESSION: 1. Negative CTA for emergent large vessel occlusion. 2. Interval stenting at the proximal left ICA and left M1 segment without complication. Patent flow seen through both stents. 3. 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 No residual neck remnant identified. 4. Postoperative changes from interval cutdown for left carotid artery exposure/access. Surgical drain remains in place. Small blush of contrast adjacent to the drain consistent with a small focus of active bleeding, likely venous in nature. 5. Otherwise stable CTA with extensive atherosclerotic change elsewhere throughout the major arterial vasculature of the head and neck. CT PERFUSION IMPRESSION: 1. Negative CT perfusion for acute core infarct. 2. Increased cerebral blood flow and blood volume with decreased T-max within the left cerebral hemisphere, reflecting improved vascular flow to the left MCA distribution due to the left ICA and MCA stents. 3. Otherwise negative CT perfusion, with no other perfusion abnormality. These results were communicated to Dr. Lorraine Lax at 9:30 pmon 1/27/2021by text page via the  [facility] messaging system. Electronically Signed   By: Jeannine Boga M.D.   On: 03/26/2019 22:49   CT ANGIO NECK W OR WO CONTRAST  Result Date: 03/26/2019 CLINICAL DATA:  Follow-up examination for acute stroke, recent ICA and MCA stenting and aneurysm coiling EXAM: CT ANGIOGRAPHY HEAD AND NECK CT PERFUSION BRAIN TECHNIQUE: Multidetector CT imaging of the head and neck was performed using the standard protocol during bolus administration of intravenous contrast. Multiplanar CT image reconstructions and MIPs were obtained to evaluate the vascular anatomy. Carotid stenosis measurements (when applicable) are obtained utilizing NASCET criteria, using the distal internal carotid diameter as the denominator. Multiphase CT imaging of the brain was performed following IV bolus contrast injection. Subsequent parametric perfusion maps were calculated using RAPID software. CONTRAST:  127mL OMNIPAQUE IOHEXOL 350 MG/ML SOLN COMPARISON:  Comparison made with prior CTA from 03/19/2019. FINDINGS: CT HEAD FINDINGS Brain: Generalized age-related cerebral atrophy with chronic small vessel ischemic disease. No acute intracranial hemorrhage. No acute large vessel territory infarct. No mass lesion, midline shift or mass effect. No hydrocephalus. No extra-axial fluid collection. 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 Vascular stent has been placed within the left M1 segment. No hyperdense vessel. Scattered vascular calcifications noted within the carotid siphons. Skull: Scalp soft tissues within normal limits.  Calvarium intact. Sinuses/Orbits:  Globes and orbital soft tissues within normal limits. Scattered mucosal thickening noted within the ethmoidal air cells and right maxillary sinus. Few small air-fluid levels noted. Mastoid air cells are clear. Patient is intubated. Other: None. CTA NECK FINDINGS Aortic arch: Visualized aortic arch of normal caliber. Bovine arch with common origin of  the right brachiocephalic and left common carotid artery noted. Extensive noncalcified plaque seen throughout the visualized arch and about the origin of the great vessels without hemodynamically significant stenosis. Irregular soft plaque and/or thrombus protruding into the lumen at the origin of the right brachiocephalic artery (series 12, image 38), unchanged. 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 Subclavian arteries otherwise irregular but patent without flow-limiting stenosis. Right carotid system: Right common carotid artery patent from its origin to the bifurcation without stenosis. Mild scattered plaque about the right bifurcation/proximal right ICA without hemodynamically significant stenosis. Right ICA irregular but patent to the skull base without stenosis, dissection, or occlusion. Left carotid system: Multifocal atheromatous irregularity within the left common carotid artery without stenosis. Interval placement of a vascular stent, proximal aspect at the origin of the left ICA. Mild-to-moderate stenoses involving the mid and distal aspect of the stent, measuring up to approximately 50% by NASCET criteria. Widely patent flow otherwise seen through the stent. No intraluminal thrombus or other complication. Left ICA irregular but otherwise widely patent to the skull base without stenosis, dissection, or occlusion. Vertebral arteries: Both vertebral arteries arise from the subclavian arteries. Vertebral arteries remain widely patent within the neck without stenosis, dissection or occlusion. Skeleton: No acute osseous abnormality. No discrete osseous lesions. Moderate cervical spondylosis noted at C5-6. Patient is edentulous. Other neck: Endotracheal and enteric tubes in place. Postoperative changes from recent cutdown and open exposure of the left common carotid artery seen at the lower anterior left neck. Percutaneous drain remains in place within this region with  associated scattered foci of soft tissue emphysema. Associated postoperative swelling and blood products present within this region as well. Small linear contrast blush within this region adjacent to the surgical drain likely reflects a small amount of persistent bleeding, likely venous in nature (series 5, image 36). No other active contrast extravasation. Upper chest: Layering bilateral pleural effusions with associated atelectasis partially visualized. Paraseptal emphysematous changes noted at the lung apices. Median sternotomy partially visualized. Review of the MIP images confirms the above findings CTA HEAD FINDINGS Anterior circulation: Petrous segments are widely patent. Scattered atherosclerotic change throughout the carotid siphons with associated moderate multifocal narrowing, unchanged. Left A1 widely patent. Hypoplastic right A1. Normal anterior communicating artery. Partially azygos ACA noted. 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 No visible neck remnant identified. Grossly patent flow through the adjacent stent. Interval placement of a vascular stent across the previously seen severe left M1 stenosis. Patent flow is seen through the stent. Left MCA branches perfused distally. Mild stenosis involving the proximal right M1 segment noted, stable. Distal right MCA branches well perfused and stable from previous. Posterior circulation: Vertebral arteries remain widely patent to the vertebrobasilar junction. Posterior inferior cerebral arteries patent bilaterally. Basilar diffusely diminutive with associated mild multifocal narrowing. Superior cerebral arteries patent bilaterally. PCA supplied via hypoplastic P1 segments as well as robust bilateral posterior communicating arteries. Prominent atherosclerotic change throughout both PCAs with associated moderate to severe multifocal stenoses, right worse than left, grossly stable. Venous sinuses: Grossly patent allowing for  timing of the contrast bolus Anatomic variants: Predominant fetal type origin of the PCAs. Hypoplastic right  A1 segment. Review of the MIP images confirms the above findings CT Brain Perfusion Findings: CBF (<30%) Volume: 92mL Perfusion (Tmax>6.0s) volume: 105mL Mismatch Volume: 42mL Infarction Location:Negative CT perfusion for acute ischemia. On source perfusion maps, there is increased cerebral blood volume and cerebral blood flow with mildly decreased mean transit time and T-max, likely reflecting improved cerebrovascular flow to the left cerebral hemisphere due to interval stenting of the left ICA and MCA. No other perfusion abnormality. IMPRESSION: CT HEAD IMPRESSION: 1. No acute intracranial abnormality. 2. 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 3. 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 CTA HEAD AND NECK IMPRESSION: 1. Negative CTA for emergent large vessel occlusion. 2. Interval stenting at the proximal left ICA and left M1 segment without complication. Patent flow seen through both stents. 3. 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 No residual neck remnant identified. 4. Postoperative changes from interval cutdown for left carotid artery exposure/access. Surgical drain remains in place. Small blush of contrast adjacent to the drain consistent with a small focus of active bleeding, likely venous in nature. 5. Otherwise stable CTA with extensive atherosclerotic change elsewhere throughout the major arterial vasculature of the head and neck. CT PERFUSION IMPRESSION: 1. Negative CT perfusion for acute core infarct. 2. Increased cerebral blood flow and blood volume with decreased T-max within the left cerebral hemisphere, reflecting improved vascular flow to the left MCA distribution due to the left ICA and MCA stents. 3. Otherwise negative CT perfusion, with no other perfusion abnormality. These  results were communicated to Dr. Lorraine Lax at 9:30 pmon 1/27/2021by text page via the [facility] messaging system. Electronically Signed   By: Jeannine Boga M.D.   On: 03/26/2019 22:49   CT CEREBRAL PERFUSION W CONTRAST  Result Date: 03/26/2019 CLINICAL DATA:  Follow-up examination for acute stroke, recent ICA and MCA stenting and aneurysm coiling EXAM: CT ANGIOGRAPHY HEAD AND NECK CT PERFUSION BRAIN TECHNIQUE: Multidetector CT imaging of the head and neck was performed using the standard protocol during bolus administration of intravenous contrast. Multiplanar CT image reconstructions and MIPs were obtained to evaluate the vascular anatomy. Carotid stenosis measurements (when applicable) are obtained utilizing NASCET criteria, using the distal internal carotid diameter as the denominator. Multiphase CT imaging of the brain was performed following IV bolus contrast injection. Subsequent parametric perfusion maps were calculated using RAPID software. CONTRAST:  151mL OMNIPAQUE IOHEXOL 350 MG/ML SOLN COMPARISON:  Comparison made with prior CTA from 03/19/2019. FINDINGS: CT HEAD FINDINGS Brain: Generalized age-related cerebral atrophy with chronic small vessel ischemic disease. No acute intracranial hemorrhage. No acute large vessel territory infarct. No mass lesion, midline shift or mass effect. No hydrocephalus. No extra-axial fluid collection. 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 Vascular stent has been placed within the left M1 segment. No hyperdense vessel. Scattered vascular calcifications noted within the carotid siphons. Skull: Scalp soft tissues within normal limits.  Calvarium intact. Sinuses/Orbits: Globes and orbital soft tissues within normal limits. Scattered mucosal thickening noted within the ethmoidal air cells and right maxillary sinus. Few small air-fluid levels noted. Mastoid air cells are clear. Patient is intubated. Other: None. CTA NECK FINDINGS Aortic  arch: Visualized aortic arch of normal caliber. Bovine arch with common origin of the right brachiocephalic and left common carotid artery noted. Extensive noncalcified plaque seen throughout the visualized arch and about the origin of the great vessels without hemodynamically significant stenosis. Irregular soft plaque and/or thrombus protruding into  the lumen at the origin of the right brachiocephalic artery (series 12, image 38), unchanged. 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 Subclavian arteries otherwise irregular but patent without flow-limiting stenosis. Right carotid system: Right common carotid artery patent from its origin to the bifurcation without stenosis. Mild scattered plaque about the right bifurcation/proximal right ICA without hemodynamically significant stenosis. Right ICA irregular but patent to the skull base without stenosis, dissection, or occlusion. Left carotid system: Multifocal atheromatous irregularity within the left common carotid artery without stenosis. Interval placement of a vascular stent, proximal aspect at the origin of the left ICA. Mild-to-moderate stenoses involving the mid and distal aspect of the stent, measuring up to approximately 50% by NASCET criteria. Widely patent flow otherwise seen through the stent. No intraluminal thrombus or other complication. Left ICA irregular but otherwise widely patent to the skull base without stenosis, dissection, or occlusion. Vertebral arteries: Both vertebral arteries arise from the subclavian arteries. Vertebral arteries remain widely patent within the neck without stenosis, dissection or occlusion. Skeleton: No acute osseous abnormality. No discrete osseous lesions. Moderate cervical spondylosis noted at C5-6. Patient is edentulous. Other neck: Endotracheal and enteric tubes in place. Postoperative changes from recent cutdown and open exposure of the left common carotid artery seen at the lower  anterior left neck. Percutaneous drain remains in place within this region with associated scattered foci of soft tissue emphysema. Associated postoperative swelling and blood products present within this region as well. Small linear contrast blush within this region adjacent to the surgical drain likely reflects a small amount of persistent bleeding, likely venous in nature (series 5, image 36). No other active contrast extravasation. Upper chest: Layering bilateral pleural effusions with associated atelectasis partially visualized. Paraseptal emphysematous changes noted at the lung apices. Median sternotomy partially visualized. Review of the MIP images confirms the above findings CTA HEAD FINDINGS Anterior circulation: Petrous segments are widely patent. Scattered atherosclerotic change throughout the carotid siphons with associated moderate multifocal narrowing, unchanged. Left A1 widely patent. Hypoplastic right A1. Normal anterior communicating artery. Partially azygos ACA noted. 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 No visible neck remnant identified. Grossly patent flow through the adjacent stent. Interval placement of a vascular stent across the previously seen severe left M1 stenosis. Patent flow is seen through the stent. Left MCA branches perfused distally. Mild stenosis involving the proximal right M1 segment noted, stable. Distal right MCA branches well perfused and stable from previous. Posterior circulation: Vertebral arteries remain widely patent to the vertebrobasilar junction. Posterior inferior cerebral arteries patent bilaterally. Basilar diffusely diminutive with associated mild multifocal narrowing. Superior cerebral arteries patent bilaterally. PCA supplied via hypoplastic P1 segments as well as robust bilateral posterior communicating arteries. Prominent atherosclerotic change throughout both PCAs with associated moderate to severe multifocal stenoses, right  worse than left, grossly stable. Venous sinuses: Grossly patent allowing for timing of the contrast bolus Anatomic variants: Predominant fetal type origin of the PCAs. Hypoplastic right A1 segment. Review of the MIP images confirms the above findings CT Brain Perfusion Findings: CBF (<30%) Volume: 38mL Perfusion (Tmax>6.0s) volume: 70mL Mismatch Volume: 64mL Infarction Location:Negative CT perfusion for acute ischemia. On source perfusion maps, there is increased cerebral blood volume and cerebral blood flow with mildly decreased mean transit time and T-max, likely reflecting improved cerebrovascular flow to the left cerebral hemisphere due to interval stenting of the left ICA and MCA. No other perfusion abnormality. IMPRESSION: CT HEAD IMPRESSION: 1. No acute intracranial abnormality. 2. Sequelae  of interval left MCA stenting and left pericallosal aneurysm coiling. 3. 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 CTA HEAD AND NECK IMPRESSION: 1. Negative CTA for emergent large vessel occlusion. 2. Interval stenting at the proximal left ICA and left M1 segment without complication. Patent flow seen through both stents. 3. 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 No residual neck remnant identified. 4. Postoperative changes from interval cutdown for left carotid artery exposure/access. Surgical drain remains in place. Small blush of contrast adjacent to the drain consistent with a small focus of active bleeding, likely venous in nature. 5. Otherwise stable CTA with extensive atherosclerotic change elsewhere throughout the major arterial vasculature of the head and neck. CT PERFUSION IMPRESSION: 1. Negative CT perfusion for acute core infarct. 2. Increased cerebral blood flow and blood volume with decreased T-max within the left cerebral hemisphere, reflecting improved vascular flow to the left MCA distribution due to the left ICA and MCA stents. 3.  Otherwise negative CT perfusion, with no other perfusion abnormality. These results were communicated to Dr. Lorraine Lax at 9:30 pmon 1/27/2021by text page via the [facility] messaging system. Electronically Signed   By: Jeannine Boga M.D.   On: 03/26/2019 22:49   DG Chest Port 1 View  Result Date: 03/26/2019 CLINICAL DATA:  Endotracheal tube placement. Hypertension. Coronary artery disease. EXAM: PORTABLE CHEST 1 VIEW COMPARISON:  03/17/2019 from [place]: Prior median sternotomy. Endotracheal tube terminates 5.5 cm above carina. Nasogastric terminates at the body of the stomach with the side port likely just at the gastroesophageal junction. Numerous leads and wires project over the chest. Cardiomegaly accentuated by AP portable technique. The apices are partially excluded. No pleural fluid. Low lung volumes with resultant pulmonary interstitial prominence. New subsegmental atelectasis at the left lung base. IMPRESSION: Nasogastric tube borderline low in position.  Consider advancement. Otherwise, appropriate position of support apparatus. Diminished lung volumes with new left lower lobe subsegmental atelectasis. Cardiomegaly without congestive failure. Electronically Signed   By: Abigail Miyamoto M.D.   On: 03/26/2019 16:16   DG C-Arm 1-60 Min-No Report  Result Date: 03/26/2019 Fluoroscopy was utilized by the requesting physician.  No radiographic interpretation.   VAS US CAROTID  Result Date: 03/27/2019 Carotid Arterial Duplex Study Indications:       CVA, Syncope, Left stent and possible hematoma. Risk Factors:      Hypertension, current smoker, coronary artery disease. Limitations        Today's exam was limited due to the post surgical status of                    the patient, patient on a ventilator and movement. Comparison Study:  No prior study on file for comparison. Performing Technologist: Sharion Dove RVS  Examination Guidelines: A complete evaluation includes B-mode imaging,  spectral Doppler, color Doppler, and power Doppler as needed of all accessible portions of each vessel. Bilateral testing is considered an integral part of a complete examination. Limited examinations for reoccurring indications may be performed as noted.  Left Carotid Findings: +----------+--------+--------+--------+------------------+------------------+           PSV cm/sEDV cm/sStenosisPlaque DescriptionComments           +----------+--------+--------+--------+------------------+------------------+ CCA Prox  94      19                                intimal thickening +----------+--------+--------+--------+------------------+------------------+ CCA Distal81  15                                intimal thickening +----------+--------+--------+--------+------------------+------------------+ ICA Prox                                            stent              +----------+--------+--------+--------+------------------+------------------+ ICA Distal52      22                                                   +----------+--------+--------+--------+------------------+------------------+ ECA       55      12                                                   +----------+--------+--------+--------+------------------+------------------+ +----------+--------+--------+--------------+-------------------+           PSV cm/sEDV cm/sDescribe      Arm Pressure (mmHG) +----------+--------+--------+--------------+-------------------+ Subclavian                Not identified                    +----------+--------+--------+--------------+-------------------+ +---------+--------+--------+--------------+ VertebralPSV cm/sEDV cm/sNot identified +---------+--------+--------+--------------+  Left Stent(s): +--------------+--+--++++ Prox to Stent 6211 +--------------+--+--++++ Proximal [id number] +--------------+--+--++++ Mid Stent     7227  +--------------+--+--++++    Summary:  Left Carotid: Patent stent. 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 *See table(s) above for measurements and observations.     Preliminary     PHYSICAL EXAM  Temp:  [97.6 F (36.4 C)-99.3 F (37.4 C)] 99.3 F (37.4 C) (01/28 1200) Pulse Rate:  [52-79] 72 (01/28 1000) Resp:  [11-20] 17 (01/28 1119) BP: (91-150)/(55-101) 113/101 (01/28 1000) SpO2:  [96 %-100 %] 99 % (01/28 1119) Arterial Line BP: (111-181)/(64-88) 135/71 (01/28 1000) FiO2 (%):  [40 %-100 %] 40 % (01/28 0753)  General - Well nourished, well developed, intubated just off sedation.  Ophthalmologic - fundi not visualized due to noncooperation.  Cardiovascular - Regular rate and rhythm.  Neuro - intubated just off sedation, eyes open on voice, following all simple commands.  Able to mouse words "good morning" when greeting with me. Eyes in mid position, able to have bilateral gaze with unsustained nystagmus. Blinking to visual threat, bilateral tracking, PERRL. Corneal reflex present, gag and cough present. Breathing over the vent.  Facial symmetry not able to test due to ET tube.  Tongue midline in mouth.  Bilateral upper extremity 3/5, left lower extremity 3 -/5 proximal and 3/5 distal bilaterally. DTR 1+ and no babinski. Sensation symmetrical, coordination slow with b/l hands difficulty to reach nose, but no ataxia or dysmetria seen. Gait not tested.    ASSESSMENT/PLAN Mr. Gevin Orton Cheatam is a 69 y.o. 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  He subsequently became diaphoretic, hypotensive and unresponsive and was taken to [facility] where an MRI revealed a stroke. transferred to [facility].  Found to have a 90% L M1 stenosis, large L ACA aneurysm and atrial appendage clot. Underwent L CEA followed by L M1 stent angioplasty and stent assisted coiling L ACA aneurysm. Found to have R hemiparesis following procedure.  Left M1 high grade stenosis  CTA head and neck moderate to severe stenosis left M1 with poststenotic dilatation  IR severe left M1 90% stenosis  On Plavix 1/27, P2Y12 = 255  S/p left M1 stenting 1/27 Deveshwar  Post IR neuro changes - R HP, resolved  CT stable, CTA showed patent stent  Now on ASA and brilinta - P2Y12 = 21  Left ACA pericallosal segment aneurysm  CTA head and neck 4x7  IR 9.7x7  S/p stent assisted coiling for aneurysm repair 1/27 Deveshwar  CTA head and neck - 1/28 - stable coiling and stenting  Now on ASA and brilinta  Left ICA proximal stenosis  CT head and neck left ICA proximal 50% stenosis with soft plaque versus mural thrombus  IR 75% left ICA proximal stenosis  Was on Plavix and heparin IV  L CCA sheath placement in OR by VVS due to poor femoral access then to IR frontal operculum stent  S/p L ICA stent 1/27 Deveshwar, then back to OR for sheath removal and CCA closure by VVS  Post IR neuro changes - R HP, resolved  CT stable and CTA neck patent stent  On ASA and brilinta  Stroke:  early/ subacute infarct with in the right cerebellar peduncle, likely small vessel disease.   CT head - OSH - showed 8X56mm focal slightly dense lesion within the left and anterior callosal region   MRI head - OSH - anterior cerebral artery pericallosal aneurysm. Showed an early/ subacute infarct with in the right cerebellum in the right brachium pontis.  CTA H&N - OSH - 4 x 7 mm aneurysm left pericallosal segment left ACA without evidence of rupture. Moderate to severe stenosis left M1 segment with poststenotic dilatation. Atherosclerotic disease/mural thrombus in the proximal left ICA 50% stenosis.  IR - severe 90% left MCA M1  stenosis.  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  75% left ICA proximal stenosis.  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   LDL - 177  HgbA1c - 6.4  VTE prophylaxis - SCDs and Heparin IV  aspirin 81 mg daily prior to admission, now on aspirin 81, brilinta 90 bid and heparin IV - discussed with Dr. Estanislado Pandy, once stable change heparin IV to DOAC and then stop ASA 81, continue brilinta  Therapy recommendations:  HH PT, OT, SLP  Disposition:  Pending  LAA thrombus  TEE - 03/20/2019 OSH - Left atrial appendage thrombus  Tele monitoring to evaluate for afib  Cardiology on board  on heparin IV. Pharmacy adjusting goals to stroke protocol  Recommend long term cardiac monitoring with cardiology to evaluate for afib. If afib found pt will need long term anticoagulation  PAD  Pre and post IR procedure, but her lower extremity PAD found with Doppler  Vascular surgery consulted  Dr. Oneida Alar to follow as outpatient  No IR procedure from femoral approach  Hypertension  Home BP meds: none listed  Stable  BP goal 120-140 x 24h following IR procedure.   Long-term BP goal  normotensive  Hyperlipidemia  Home Lipid lowering medication: Lovaza  LDL  177, goal < 70  Current lipid lowering medication: Lipitor 80 mg daily   Continue statin at discharge  Diabetes  Home diabetic meds: none   Current diabetic meds: SSI   HgbA1c 6.4, goal < 7.0  CBG monitoring  Close PCP follow-up  Tobacco abuse  Current smoker  Smoking cessation counseling provided  Pt is willing to quit  Other Stroke Risk Factors  Previous ETOH use  Family hx stroke (mother)  Coronary artery disease  Other Active Problems  Hypokalemia, resolved  Anemia 8.5-8.2-8.8  Hospital day # 7  This patient is critically ill due to stroke, large aneurysm, ICA stenosis, intracranial stenosis, status post IR procedure, PAD and at significant risk of  neurological worsening, death form recurrent stroke, hemorrhagic conversion, aneurysm rupture, seizure, limb ischemia. This patient's care requires constant monitoring of vital signs, hemodynamics, respiratory and cardiac monitoring, review of multiple databases, neurological assessment, discussion with family, other specialists and medical decision making of high complexity. I spent 35 minutes of neurocritical care time in the care of this patient.  I discussed with Dr. Randal Buba, MD PhD Stroke Neurology 03/27/2019 7:07 PM   To contact Stroke Continuity provider, please refer to [email or web address] After hours, contact General Neurology

## 2019-03-27 NOTE — Progress Notes (Signed)
SLP Cancellation Note  Patient Details Name: Jeremy Sherman MRN: [id number] DOB: 1950/08/30   Cancelled treatment:       Reason Eval/Treat Not Completed: Medical issues which prohibited therapy (intubated).    Osie Bond., M.A. [place] [facility] Pager [phone] Office [phone]  03/27/2019, 10:45 AM

## 2019-03-27 NOTE — Evaluation (Signed)
Physical Therapy Re-Evaluation Patient Details Name: Jeremy Sherman MRN: [id number] DOB: Feb 15, 1951 Today's Date: 03/27/2019   History of Present Illness  69 year old male with a history of hypothyroidism, hypertension, hyperlipidemia, prediabetes, CAD s/p CABG 07/02/2007, who comes in from outside hospital with R cerebellar infarct and L parietal infarct. s/p arteriogram which additionally reveals L ACA pericallosal aneurysm. Pt now s/p L common carotid arteriogram with angioplasty of L MCA M1 segment, L ACA pericallosal aneurysm coiling, and L ICA stenting. Extubated 03/27/19.  Clinical Impression   Pt presents with significant weakness, difficulty performing bed mobility requiring max+2 to perform, poor sitting balance, decreased activity tolerance, and impaired cognition especially when compared to earlier PT visits this hospitalization. Pt to benefit from acute PT to address deficits. Pt required max +2 to move to and from EOB, and maintain EOB sitting ~5 minutes. Unable to transfer this session per Dr. Chase Caller request because of carotid drain post-aneurysm repair. PT recommending CIR to address decline in function and to maximize pt mobility and independence upon d/c. PT to progress mobility as tolerated, and will continue to follow acutely.      Follow Up Recommendations CIR;Supervision/Assistance - 24 hour    Equipment Recommendations  None recommended by PT    Recommendations for Other Services       Precautions / Restrictions Precautions Precautions: Fall Precaution Comments: bed rest orders active - per Dr. Chase Caller, pt appropriate to dangle EOB but does not advise OOB mobility due to carotid JP drain at this time Restrictions Weight Bearing Restrictions: No      Mobility  Bed Mobility Overal bed mobility: Needs Assistance Bed Mobility: Supine to Sit;Sit to Supine     Supine to sit: Max assist;+2 for physical assistance;+2 for safety/equipment Sit to supine:  Max assist;+2 for physical assistance;+2 for safety/equipment   General bed mobility comments: max A +2 for supine <> sit to EOB. Cueing and assist needed at [place] and assist for trunk elevation  Transfers                 General transfer comment: not tested this date due to MD request to hold while carotid drain in place  Ambulation/Gait                Stairs            Wheelchair Mobility    Modified Rankin (Stroke Patients Only)       Balance Overall balance assessment: Needs assistance Sitting-balance support: Feet supported;No upper extremity supported Sitting balance-Leahy Scale: Poor Sitting balance - Comments: fluctuating support while sitting EOB, overall min A                                     Pertinent Vitals/Pain Pain Assessment: Faces Faces Pain Scale: Hurts a little bit Pain Location: generalized Pain Descriptors / Indicators: Aching Pain Intervention(s): Limited activity within patient's tolerance;Monitored during session;Repositioned    Home Living Family/patient expects to be discharged to:: Private residence Living Arrangements: Spouse/significant other Available Help at Discharge: Family;Available 24 hours/day Type of Home: House Home Access: Stairs to enter Entrance Stairs-Rails: None Entrance Stairs-Number of Steps: 4 Home Layout: One level Home Equipment: Cane - single point Additional Comments: works full time as a Glass blower/designer    Prior Function Level of Independence: Independent         Comments: drives     Hand Dominance   Dominant  Hand: Right    Extremity/Trunk Assessment   Upper Extremity Assessment Upper Extremity Assessment: Defer to OT evaluation    Lower Extremity Assessment Lower Extremity Assessment: Generalized weakness    Cervical / Trunk Assessment Cervical / Trunk Assessment: Normal  Communication   Communication: No difficulties  Cognition Arousal/Alertness: Suspect  due to medications;Lethargic Behavior During Therapy: Flat affect Overall Cognitive Status: Impaired/Different from baseline Area of Impairment: Safety/judgement;Problem solving;Awareness;Following commands                       Following Commands: Follows one step commands consistently;Follows one step commands with increased time Safety/Judgement: Decreased awareness of safety Awareness: Intellectual Problem Solving: Difficulty sequencing;Requires verbal cues;Slow processing;Requires tactile cues General Comments: pt presenting with status change from initial eval, much more lethargic this date- stating "there ya go" to many statements when not always appropriate. Decreased overall awareness of deficits      General Comments      Exercises     Assessment/Plan    PT Assessment Patient needs continued PT services  PT Problem List Decreased balance;Decreased mobility;Decreased knowledge of use of DME;Decreased safety awareness;Decreased knowledge of precautions;Decreased strength;Decreased activity tolerance;Pain       PT Treatment Interventions DME instruction;Gait training;Stair training;Functional mobility training;Therapeutic activities;Therapeutic exercise;Balance training;Cognitive remediation;Patient/family education    PT Goals (Current goals can be found in the Care Plan section)  Acute Rehab PT Goals Patient Stated Goal: get stronger PT Goal Formulation: With patient Time For Goal Achievement: 04/05/19 Potential to Achieve Goals: Good    Frequency Min 4X/week   Barriers to discharge        Co-evaluation PT/OT/SLP Co-Evaluation/Treatment: Yes Reason for Co-Treatment: Complexity of the patient's impairments (multi-system involvement);For patient/therapist safety PT goals addressed during session: Mobility/safety with mobility;Balance;Strengthening/ROM OT goals addressed during session: ADL's and self-care;Strengthening/ROM       AM-PAC PT "6 Clicks"  Mobility  Outcome Measure Help needed turning from your back to your side while in a flat bed without using bedrails?: A Lot Help needed moving from lying on your back to sitting on the side of a flat bed without using bedrails?: Total Help needed moving to and from a bed to a chair (including a wheelchair)?: Total Help needed standing up from a chair using your arms (e.g., wheelchair or bedside chair)?: Total Help needed to walk in hospital room?: Total Help needed climbing 3-5 steps with a railing? : Total 6 Click Score: 7    End of Session Equipment Utilized During Treatment: Gait belt Activity Tolerance: Patient tolerated treatment well;Patient limited by fatigue Patient left: with call bell/phone within reach;in bed;with bed alarm set Nurse Communication: Mobility status PT Visit Diagnosis: Other abnormalities of gait and mobility (R26.89);Unsteadiness on feet (R26.81)    Time: [id number] PT Time Calculation (min) (ACUTE ONLY): 23 min   Charges:   PT Evaluation $PT Re-evaluation: 1 Re-eval         Andrewjames Weirauch E, PT Acute Rehabilitation Services Pager [phone]  Office [phone]  Jaekwon Mcclune D Elonda Husky 03/27/2019, 5:48 PM

## 2019-03-27 NOTE — Progress Notes (Signed)
D/w Dr Gala Lewandowsky of [facility] - they will be primary 03/28/19  and ccm off    SIGNATURE    Dr. Brand Males, M.D., F.C.C.P,  Pulmonary and Critical Care Medicine Staff Physician, [place] Director - Interstitial Lung Disease  Program  Pulmonary [place] [facility] [place], [place], [address]  Pager: [phone], If no answer or between  15:00h - 7:00h: call [phone] Telephone: [phone]  5:59 PM 03/27/2019

## 2019-03-27 NOTE — Procedures (Signed)
Extubation Procedure Note  Patient Details:   Name: Jeremy Sherman DOB: 1950-07-31 MRN: [id number]   Airway Documentation:    Vent end date: 03/27/19 Vent end time: 1119   Evaluation  O2 sats: stable throughout Complications: No apparent complications Patient did tolerate procedure well. Bilateral Breath Sounds: Diminished  Patient able to speak? Yes  [name] 03/27/2019, 11:21 AM

## 2019-03-27 NOTE — Progress Notes (Addendum)
ANTICOAGULATION CONSULT NOTE  Pharmacy Consult for Heparin Indication: Left Atrial Appendage Thrombus  No Known Allergies  Patient Measurements: Height: 6\' 5"  (195.6 cm) Weight: 233 lb 14.5 oz (106.1 kg) IBW/kg (Calculated) : 89.1 Heparin Dosing Weight: 106.1 kg   Vital Signs: Temp: 99.3 F (37.4 C) (01/28 1200) Temp Source: Oral (01/28 1200) BP: 128/61 (01/28 1200) Pulse Rate: 78 (01/28 1345)  Labs: Recent Labs    03/25/19 0225 03/25/19 0225 03/26/19 0444 03/26/19 0444 03/26/19 0737 03/26/19 0737 03/26/19 1250 03/26/19 1250 03/26/19 1406 03/26/19 1406 03/26/19 1648 03/27/19 0537 03/27/19 1338  HGB 10.3*   < > 9.6*   < > 9.2*   < > 8.2*   < > 8.5*   < > 8.2* 8.8*  --   HCT 30.0*   < > 28.7*   < > 27.0*   < > 24.0*   < > 25.0*  --  24.0* 25.8*  --   PLT 177  --  195  --   --   --   --   --   --   --   --  173  --   HEPARINUNFRC 0.30   < > 0.27*  --   --   --   --   --   --   --   --  0.23* 0.24*  CREATININE 1.22   < > 1.19   < > 1.00  --  1.00  --   --   --   --  0.96  --    < > = values in this interval not displayed.    Estimated Creatinine Clearance: 90.2 mL/min (by C-G formula based on SCr of 0.96 mg/dL).   Assessment: Pharmacy is consulted to dose heparin in 69 yr old male with left atrial appendage thrombus shown on TEE from 03/20/[facility] at [facility]. 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  S/p revascularization left ICA stenosis and left MCA stenosis by IR. Discussed with Neuro IR NP Burnetta Sabin) and will raise heparin goal back up to 0.3-0.5. Afternoon heparin level was 0.24, which was therapeutic for the old goal of 0.1-0.25. No signs/symptoms of bleeding or problems with infusion per RN. Hg/Hct are stable. Plts are wnls. Of note, patient is also on DAPT.  Goal of Therapy:  Heparin level: 0.3-0.5 units/ml Monitor platelets by anticoagulation protocol: Yes   Plan:  - Increase heparin to 900 units/hr (will  not bolus due to recent IR procedure and concomitant DAPT) - Check heparin level in 6 hours - Monitor daily heparin level and CBC - Watch for signs/symptoms of bleeding  Sherren Kerns, PharmD PGY1 Acute Care Pharmacy Resident

## 2019-03-27 NOTE — Consult Note (Signed)
NAME:  Jeremy Sherman, MRN:  [id number], DOB:  [birth year], LOS: 7 ADMISSION DATE:  03/20/2019, CONSULTATION DATE:  1/27 REFERRING MD:  Estanislado Pandy, CHIEF COMPLAINT:  Stroke; ventilator management s/p Neuro IR   Brief History    69 year old male patient with history as mentioned below presented to [facility] on 1/18 with chief complaint of dizziness and syncopal episode witnessed at primary care provider's office, with associated unresponsiveness, diaphoresis and hypotension. 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 Neuro and neuro IR consulted.  Had cerebral angiogram 1/22 post procedure had cold right foot. Vascular surg consulted. ABI and dopplers showed intact flow and was felt represented chronic changes. Recommended angiogram if symptomatic.  To IR on 1/27 for planned intervention involving what was felt to be probably dissecting ACA aneurysm, and endovascular treatment of left MCA stenosis. PCCM asked to assist w/ post-operative care.    Past Medical History  Hypothyroidism, hypertension, hyperlipidemia, prediabetes, coronary artery disease with prior bypass surgery 07/02/2007, smoker. Note: [facility] 1/12 through 1/14 with dizziness felt to be secondary to hypertensive urgency  Significant Hospital Events   1/18 MRI showed early subacute infarct within the right cerebellum and right brachium pontis, also acute left Puctate and left parietal lobe infarct. 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 ECHO showed  TEE EF 45-50% , left atrial appendage thrombus, and mild MR.  Hospital course: admitted 1/18 1/22 both neuro and N-IR consulted. Started on asa and plavix. As well as high dose statin. As well as therapeutic AC.  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 3. Approx 75% LT ICA prox stenosis. Vascular surg consulted s/p angiogram lost pulses to right foot. Duplex and ABIs showed CFA and profunda open. felt this all represented chronic disease.  Recommended A-gram if symptomatic.  1/23 followed by stroke team recommending BP goal 130-150, anticoagulation and further cards eval to look for AF given the LA thrombus.  1/27 To IR for planned intervention involving what was felt to be probably dissecting ACA aneurysm, and endovascular treatment of left MCA stenosis. PCCM asked to assist w/ post-operative care.  After undergoing stent angioplasty of left MCA and stent assisted coiling of left ACA aneurysm.  Went to general OR for closure of carotid access site by vascular surgery,  Consults:  Neuro   Procedures:  Cerebral angiogram 1/22:  severe 90% left MCA M1 stenosis.  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  75% left ICA proximal stenosis. 1/27: stent angioplasty of left MCA and stent assisted coiling of left ACA aneurysm.  Went to general OR for closure of carotid access site by vascular surgery,   Significant Diagnostic Tests:  MRI head - OSH - anterior cerebral artery pericallosal aneurysm. Showed an early/ subacute infarct with in the right cerebellum in the right brachium pontis TEE 1/21- OSH - Left atrial appendage thrombus  1/22: ABI right foot: Pt common femoral and profunda open. Most likely this is all chronic disease (Fields)  Micro Data:    Antimicrobials:    Interim history/subjective:    03/27/2019 - Qtc 471msec per tech. Cuff leak Positive. No active bleeding from carotid drain. Light WUA assessment earlier by RN ->  Followed  commands and neuro intact. Carotid US still pending. Off neo, On cleviprex and neo - target sbp 10-140. ON Iv heparin gtt  Objective   Blood pressure (!) 113/101, pulse 72,  temperature 98.5 F (36.9 C), temperature source Axillary, resp. rate 15, height 6\' 5"  ([id number] m), weight 106.1 kg, SpO2 99 %.    Vent Mode: PRVC FiO2 (%):  [40 %-100 %] 40 % Set Rate:  [12 bmp] 12 bmp Vt Set:  [700 mL] 700 mL PEEP:  [5 cmH20] 5 cmH20 Plateau Pressure:  [19 cmH20-20 cmH20] 20 cmH20   Intake/Output Summary (Last 24 hours) at 03/27/2019 1011 Last data filed at 03/27/2019 1000 Gross per 24 hour  Intake 4691.74 ml  Output 1750 ml  Net 2941.74 ml   Filed Weights   03/20/19 1842  Weight: 106.1 kg   General Appearance:  Looks criticall ill OBESE - no Head:  Normocephalic, without obvious abnormality, atraumatic Eyes:  PERRL - yes, conjunctiva/corneas - muddy     Ears:  Normal external ear canals, both ears Nose:  G tube - n Throat:  ETT TUBE - yes , OG tube - yes Neck:  Supple,  No enlargement/tenderness/nodules. HAS LEFT CAROTID DRAIN - no active bleeding Lungs: Clear to auscultation bilaterally, Ventilator   Synchrony - yes Heart:  S1 and S2 normal, no murmur, CVP - no.  Pressors - no Abdomen:  Soft, no masses, no organomegaly Genitalia / Rectal:  Not done Extremities:  Extremities- intact Skin:  ntact in exposed areas . Sacral area - not examined Neurologic:  Sedation - diprivan gtt -> RASS - -3. 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  Need to ensure no upper airway obstruction prior to extubation. Cuff leak +  Plan Ok to do SBT off diprivan Await Carotid US v [facility] input on severity/risk of neck hematoma Full vent support otherwise F/u cxr and abg Daily assessment for SBT VAP bundle   Fluid and electrolyte imbalance:   - 03/27/2019 - mild low K  Plan Replete K Monitor  Mild acute anemia. No evidence of bleeding but has has dropped from 12.7 down to 9.2 over course of hospitalization  03/27/2019 -no active bleed  Plan - PRBC for hgb </= 6.9gm%    - exceptions are   -  if ACS susepcted/confirmed then transfuse for hgb </= 8.0gm%,  or    -  active bleeding with hemodynamic instability, then transfuse regardless of hemoglobin value   At at all times try to transfuse 1 unit prbc  as possible with exception of active hemorrhage    DM and hyperglycemia  Plan ssi   History of BPH Plan Foley catheter in place Resume Flomax at some point  Best practice:  Diet: NPO Pain/Anxiety/Delirium protocol (if indicated): VAD protocol 1/27 VAP protocol (if indicated): Ordered 1/27 DVT prophylaxis: IV heparin since 1/21 GI prophylaxis: PPI Glucose control: SSI  Mobility: Bedrest Code Status: FULL CODE  Family Communication: none at bedside Disposition: ICU monitoring     ATTESTATION & SIGNATURE   The patient Jeremy Sherman is critically ill with multiple organ systems failure and requires high complexity decision making for assessment and support, frequent evaluation and titration of therapies, application of advanced monitoring technologies and extensive interpretation of multiple databases.    Critical Care Time devoted to patient care services described in this note is  31  Minutes. This time reflects time of care of this signee Dr Brand Males. This critical care time does not reflect procedure time, or teaching time or supervisory time of PA/NP/Med student/Med Resident etc but could involve care discussion time     Dr. Brand Males, M.D., [facility] Med.C.P Pulmonary and Critical Care Medicine Staff Physician [facility] Pager: [phone], If no answer or between  15:00h - 7:00h: call [phone]  03/27/2019 10:11 AM    LABS    PULMONARY Recent Labs  Lab 03/26/19 0737 03/26/19 1250 03/26/19 1406 03/26/19 1648  PHART  --   --  7.348* 7.385  PCO2ART  --   --  46.4 41.2  PO2ART  --   --  318.0* 386.0*  HCO3  --   --  25.5 24.8  TCO2 23 24 27 26   O2SAT  --   --  100.0 100.0    CBC Recent Labs  Lab 03/25/19 0225 03/25/19 0225 03/26/19 0444 03/26/19 0737 03/26/19 1406 03/26/19 1648 03/27/19 0537  HGB 10.3*   < > 9.6*   < > 8.5* 8.2* 8.8*  HCT 30.0*   < > 28.7*   < > 25.0* 24.0* 25.8*  WBC 4.8  --  4.7  --   --   --  6.3  PLT 177  --  195  --   --   --  173   < > = values in this interval not displayed.    COAGULATION Recent Labs  Lab 03/21/19 0413  INR 1.0    CARDIAC  No results for input(s): TROPONINI in the last 168 hours. No results for input(s): PROBNP in the last 168 hours.   CHEMISTRY Recent Labs  Lab 03/23/19 0003 03/23/19 0003 03/24/19 0238 03/24/19 0238 03/25/19 0225 03/25/19 0225 03/26/19 0444 03/26/19 0444 03/26/19 0737 03/26/19 0737 03/26/19 1250 03/26/19 1250 03/26/19 1406 03/26/19 1406 03/26/19 1648 03/27/19 0537  NA 135   < > 136   < > 135   < > 134*   < > 138  --  136  --  138  --  136 137  K 3.6   < > 3.8   < > 3.5   < > 3.4*   < > 3.4*   < > 3.9   < > 3.9   < > 3.8 3.8  CL 102   < > 102   < > 104  --  103  --  105  --  104  --   --   --   --  109  CO2 22  --  22  --  21*  --  22  --   --   --   --   --   --   --   --  20*  GLUCOSE 108*   < > 109*   < > 106*  --  105*  --  96  --  136*  --   --   --   --  121*  BUN 15   < > 18   < > 14  --  11  --  10  --  10  --   --   --   --  9  CREATININE 1.43*   < > 1.52*   < > 1.22  --  1.19  --  1.00  --  1.00  --   --   --   --  0.96  CALCIUM 8.9  --  8.5*  --  8.4*  --  8.4*  --   --   --   --   --   --   --   --  7.8*   < > = values in this interval not displayed.   Estimated Creatinine Clearance: 90.2 mL/min (by C-G formula based on SCr of 0.96 mg/dL).   LIVER Recent Labs  Lab 03/21/19 0413 03/22/19 0112  AST 23 22  ALT 16 15  ALKPHOS 61 64  BILITOT 0.9 0.7  PROT 7.4 7.6  ALBUMIN 3.5 3.5  INR 1.0  --      INFECTIOUS No results for input(s): LATICACIDVEN, PROCALCITON in the last 168 hours.   ENDOCRINE CBG (last 3)  Recent Labs    03/26/19 0321 03/26/19 1953 03/26/19 2323  GLUCAP 98 116* 73         IMAGING x48h  - image(s) personally visualized  -   highlighted in bold CT ANGIO HEAD W OR WO CONTRAST  Result Date: 03/26/2019 CLINICAL DATA:  Follow-up examination for acute stroke, recent ICA and MCA stenting and aneurysm coiling EXAM: CT ANGIOGRAPHY HEAD AND NECK CT PERFUSION BRAIN TECHNIQUE: Multidetector CT imaging of the head and neck was performed using the standard protocol during bolus administration of intravenous contrast. Multiplanar CT image reconstructions and MIPs were obtained to evaluate the vascular anatomy. Carotid stenosis measurements (when applicable) are obtained utilizing NASCET criteria, using the distal internal carotid diameter as the denominator. Multiphase CT imaging of the brain was performed following IV bolus contrast injection. Subsequent parametric perfusion maps were calculated using RAPID software. CONTRAST:  116mL OMNIPAQUE IOHEXOL 350 MG/ML SOLN COMPARISON:  Comparison made with prior CTA from 03/19/2019. FINDINGS: CT HEAD FINDINGS Brain: Generalized  age-related cerebral atrophy with chronic small vessel ischemic disease. No acute intracranial hemorrhage. No acute large vessel territory infarct. No mass lesion, midline shift or mass effect. No hydrocephalus. No extra-axial fluid collection. 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 Vascular stent has been placed within the left M1 segment. No hyperdense vessel. Scattered vascular calcifications noted within the carotid siphons. Skull: Scalp soft tissues within normal limits.  Calvarium intact. Sinuses/Orbits: Globes and orbital soft tissues within normal limits. Scattered mucosal thickening noted within the ethmoidal air cells and right maxillary sinus. Few small air-fluid levels noted. Mastoid air cells are clear. Patient is intubated. Other: None. CTA NECK FINDINGS Aortic arch: Visualized aortic arch of normal caliber. Bovine arch with common origin of the right brachiocephalic and left common carotid artery noted. Extensive noncalcified plaque seen throughout the visualized arch and about the  origin of the great vessels without hemodynamically significant stenosis. Irregular soft plaque and/or thrombus protruding into the lumen at the origin of the right brachiocephalic artery (series 12, image 38), unchanged. 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 Subclavian arteries otherwise irregular but patent without flow-limiting stenosis. Right carotid system: Right common carotid artery patent from its origin to the bifurcation without stenosis. Mild scattered plaque about the right bifurcation/proximal right ICA without hemodynamically significant stenosis. Right ICA irregular but patent to the skull base without stenosis, dissection, or occlusion. Left carotid system: Multifocal atheromatous irregularity within the left common carotid artery without stenosis. Interval placement of a vascular stent, proximal aspect at the origin of the left ICA.  Mild-to-moderate stenoses involving the mid and distal aspect of the stent, measuring up to approximately 50% by NASCET criteria. Widely patent flow otherwise seen through the stent. No intraluminal thrombus or other complication. Left ICA irregular but otherwise widely patent to the skull base without stenosis, dissection, or occlusion. Vertebral arteries: Both vertebral arteries arise from the subclavian arteries. Vertebral arteries remain widely patent within the neck without stenosis, dissection or occlusion. Skeleton: No acute osseous abnormality. No discrete osseous lesions. Moderate cervical spondylosis noted at C5-6. Patient is edentulous. Other neck: Endotracheal and enteric tubes in place. Postoperative changes from recent cutdown and open exposure of the left common carotid artery seen at the lower anterior left neck. Percutaneous drain remains in place within this region with associated scattered foci of soft tissue emphysema. Associated postoperative swelling and blood products present within this region as well. Small linear contrast blush within this region adjacent to the surgical drain likely reflects a small amount of persistent bleeding, likely venous in nature (series 5, image 36). No other active contrast extravasation. Upper chest: Layering bilateral pleural effusions with associated atelectasis partially visualized. Paraseptal emphysematous changes noted at the lung apices. Median sternotomy partially visualized. Review of the MIP images confirms the above findings CTA HEAD FINDINGS Anterior circulation: Petrous segments are widely patent. Scattered atherosclerotic change throughout the carotid siphons with associated moderate multifocal narrowing, unchanged. Left A1 widely patent. Hypoplastic right A1. Normal anterior communicating artery. Partially azygos ACA noted. 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 No visible neck remnant identified. Grossly patent flow  through the adjacent stent. Interval placement of a vascular stent across the previously seen severe left M1 stenosis. Patent flow is seen through the stent. Left MCA branches perfused distally. Mild stenosis involving the proximal right M1 segment noted, stable. Distal right MCA branches well perfused and stable from previous. Posterior circulation: Vertebral arteries remain widely patent to the vertebrobasilar junction. Posterior inferior cerebral arteries patent bilaterally. Basilar diffusely diminutive with associated mild multifocal narrowing. Superior cerebral arteries patent bilaterally. PCA supplied via hypoplastic P1 segments as well as robust bilateral posterior communicating arteries. Prominent atherosclerotic change throughout both PCAs with associated moderate to severe multifocal stenoses, right worse than left, grossly stable. Venous sinuses: Grossly patent allowing for timing of the contrast bolus Anatomic variants: Predominant fetal type origin of the PCAs. Hypoplastic right A1 segment. Review of the MIP images confirms the above findings CT Brain Perfusion Findings: CBF (<30%) Volume: 80mL Perfusion (Tmax>6.0s) volume: 29mL Mismatch Volume: 79mL Infarction Location:Negative CT perfusion for acute ischemia. On source perfusion maps, there is increased cerebral blood volume and cerebral blood flow with mildly decreased mean transit time and T-max, likely reflecting improved cerebrovascular flow to the left cerebral hemisphere due to interval stenting of the left ICA and  MCA. No other perfusion abnormality. IMPRESSION: CT HEAD IMPRESSION: 1. No acute intracranial abnormality. 2. 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 3. 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 CTA HEAD AND NECK IMPRESSION: 1. Negative CTA for emergent large vessel occlusion. 2. Interval stenting at the proximal left ICA and left M1 segment without complication.  Patent flow seen through both stents. 3. 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 No residual neck remnant identified. 4. Postoperative changes from interval cutdown for left carotid artery exposure/access. Surgical drain remains in place. Small blush of contrast adjacent to the drain consistent with a small focus of active bleeding, likely venous in nature. 5. Otherwise stable CTA with extensive atherosclerotic change elsewhere throughout the major arterial vasculature of the head and neck. CT PERFUSION IMPRESSION: 1. Negative CT perfusion for acute core infarct. 2. Increased cerebral blood flow and blood volume with decreased T-max within the left cerebral hemisphere, reflecting improved vascular flow to the left MCA distribution due to the left ICA and MCA stents. 3. Otherwise negative CT perfusion, with no other perfusion abnormality. These results were communicated to Dr. Lorraine Lax at 9:30 pmon 1/27/2021by text page via the [facility] messaging system. Electronically Signed   By: Jeannine Boga M.D.   On: 03/26/2019 22:49   CT ANGIO NECK W OR WO CONTRAST  Result Date: 03/26/2019 CLINICAL DATA:  Follow-up examination for acute stroke, recent ICA and MCA stenting and aneurysm coiling EXAM: CT ANGIOGRAPHY HEAD AND NECK CT PERFUSION BRAIN TECHNIQUE: Multidetector CT imaging of the head and neck was performed using the standard protocol during bolus administration of intravenous contrast. Multiplanar CT image reconstructions and MIPs were obtained to evaluate the vascular anatomy. Carotid stenosis measurements (when applicable) are obtained utilizing NASCET criteria, using the distal internal carotid diameter as the denominator. Multiphase CT imaging of the brain was performed following IV bolus contrast injection. Subsequent parametric perfusion maps were calculated using RAPID software. CONTRAST:  115mL OMNIPAQUE IOHEXOL 350 MG/ML SOLN COMPARISON:  Comparison made with  prior CTA from 03/19/2019. FINDINGS: CT HEAD FINDINGS Brain: Generalized age-related cerebral atrophy with chronic small vessel ischemic disease. No acute intracranial hemorrhage. No acute large vessel territory infarct. No mass lesion, midline shift or mass effect. No hydrocephalus. No extra-axial fluid collection. 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 Vascular stent has been placed within the left M1 segment. No hyperdense vessel. Scattered vascular calcifications noted within the carotid siphons. Skull: Scalp soft tissues within normal limits.  Calvarium intact. Sinuses/Orbits: Globes and orbital soft tissues within normal limits. Scattered mucosal thickening noted within the ethmoidal air cells and right maxillary sinus. Few small air-fluid levels noted. Mastoid air cells are clear. Patient is intubated. Other: None. CTA NECK FINDINGS Aortic arch: Visualized aortic arch of normal caliber. Bovine arch with common origin of the right brachiocephalic and left common carotid artery noted. Extensive noncalcified plaque seen throughout the visualized arch and about the origin of the great vessels without hemodynamically significant stenosis. Irregular soft plaque and/or thrombus protruding into the lumen at the origin of the right brachiocephalic artery (series 12, image 38), unchanged. 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 Subclavian arteries otherwise irregular but patent without flow-limiting stenosis. Right carotid system: Right common carotid artery patent from its origin to the bifurcation without stenosis. Mild scattered plaque about the right bifurcation/proximal right ICA without hemodynamically significant stenosis. Right ICA irregular but patent to the skull base  without stenosis, dissection, or occlusion. Left carotid system: Multifocal atheromatous irregularity within the left common carotid artery without stenosis. Interval  placement of a vascular stent, proximal aspect at the origin of the left ICA. Mild-to-moderate stenoses involving the mid and distal aspect of the stent, measuring up to approximately 50% by NASCET criteria. Widely patent flow otherwise seen through the stent. No intraluminal thrombus or other complication. Left ICA irregular but otherwise widely patent to the skull base without stenosis, dissection, or occlusion. Vertebral arteries: Both vertebral arteries arise from the subclavian arteries. Vertebral arteries remain widely patent within the neck without stenosis, dissection or occlusion. Skeleton: No acute osseous abnormality. No discrete osseous lesions. Moderate cervical spondylosis noted at C5-6. Patient is edentulous. Other neck: Endotracheal and enteric tubes in place. Postoperative changes from recent cutdown and open exposure of the left common carotid artery seen at the lower anterior left neck. Percutaneous drain remains in place within this region with associated scattered foci of soft tissue emphysema. Associated postoperative swelling and blood products present within this region as well. Small linear contrast blush within this region adjacent to the surgical drain likely reflects a small amount of persistent bleeding, likely venous in nature (series 5, image 36). No other active contrast extravasation. Upper chest: Layering bilateral pleural effusions with associated atelectasis partially visualized. Paraseptal emphysematous changes noted at the lung apices. Median sternotomy partially visualized. Review of the MIP images confirms the above findings CTA HEAD FINDINGS Anterior circulation: Petrous segments are widely patent. Scattered atherosclerotic change throughout the carotid siphons with associated moderate multifocal narrowing, unchanged. Left A1 widely patent. Hypoplastic right A1. Normal anterior communicating artery. Partially azygos ACA noted. Interval stenting and coiling of previously seen  pericallosal aneurysm. No visible neck remnant identified. Grossly patent flow through the adjacent stent. Interval placement of a vascular stent across the previously seen severe left M1 stenosis. Patent flow is seen through the stent. Left MCA branches perfused distally. Mild stenosis involving the proximal right M1 segment noted, stable. Distal right MCA branches well perfused and stable from previous. Posterior circulation: Vertebral arteries remain widely patent to the vertebrobasilar junction. Posterior inferior cerebral arteries patent bilaterally. Basilar diffusely diminutive with associated mild multifocal narrowing. Superior cerebral arteries patent bilaterally. PCA supplied via hypoplastic P1 segments as well as robust bilateral posterior communicating arteries. Prominent atherosclerotic change throughout both PCAs with associated moderate to severe multifocal stenoses, right worse than left, grossly stable. Venous sinuses: Grossly patent allowing for timing of the contrast bolus Anatomic variants: Predominant fetal type origin of the PCAs. Hypoplastic right A1 segment. Review of the MIP images confirms the above findings CT Brain Perfusion Findings: CBF (<30%) Volume: 23mL Perfusion (Tmax>6.0s) volume: 18mL Mismatch Volume: 47mL Infarction Location:Negative CT perfusion for acute ischemia. On source perfusion maps, there is increased cerebral blood volume and cerebral blood flow with mildly decreased mean transit time and T-max, likely reflecting improved cerebrovascular flow to the left cerebral hemisphere due to interval stenting of the left ICA and MCA. No other perfusion abnormality. IMPRESSION: CT HEAD IMPRESSION: 1. No acute intracranial abnormality. 2. 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 3. 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 CTA HEAD AND NECK IMPRESSION: 1. Negative CTA for emergent large vessel occlusion. 2. Interval  stenting at the proximal left ICA and left M1 segment without complication. Patent flow seen through both stents. 3. 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 No residual neck remnant identified. 4. Postoperative  changes from interval cutdown for left carotid artery exposure/access. Surgical drain remains in place. Small blush of contrast adjacent to the drain consistent with a small focus of active bleeding, likely venous in nature. 5. Otherwise stable CTA with extensive atherosclerotic change elsewhere throughout the major arterial vasculature of the head and neck. CT PERFUSION IMPRESSION: 1. Negative CT perfusion for acute core infarct. 2. Increased cerebral blood flow and blood volume with decreased T-max within the left cerebral hemisphere, reflecting improved vascular flow to the left MCA distribution due to the left ICA and MCA stents. 3. Otherwise negative CT perfusion, with no other perfusion abnormality. These results were communicated to Dr. Lorraine Lax at 9:30 pmon 1/27/2021by text page via the [facility] messaging system. Electronically Signed   By: Jeannine Boga M.D.   On: 03/26/2019 22:49   CT CEREBRAL PERFUSION W CONTRAST  Result Date: 03/26/2019 CLINICAL DATA:  Follow-up examination for acute stroke, recent ICA and MCA stenting and aneurysm coiling EXAM: CT ANGIOGRAPHY HEAD AND NECK CT PERFUSION BRAIN TECHNIQUE: Multidetector CT imaging of the head and neck was performed using the standard protocol during bolus administration of intravenous contrast. Multiplanar CT image reconstructions and MIPs were obtained to evaluate the vascular anatomy. Carotid stenosis measurements (when applicable) are obtained utilizing NASCET criteria, using the distal internal carotid diameter as the denominator. Multiphase CT imaging of the brain was performed following IV bolus contrast injection. Subsequent parametric perfusion maps were calculated using RAPID software. CONTRAST:   137mL OMNIPAQUE IOHEXOL 350 MG/ML SOLN COMPARISON:  Comparison made with prior CTA from 03/19/2019. FINDINGS: CT HEAD FINDINGS Brain: Generalized age-related cerebral atrophy with chronic small vessel ischemic disease. No acute intracranial hemorrhage. No acute large vessel territory infarct. No mass lesion, midline shift or mass effect. No hydrocephalus. No extra-axial fluid collection. 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 Vascular stent has been placed within the left M1 segment. No hyperdense vessel. Scattered vascular calcifications noted within the carotid siphons. Skull: Scalp soft tissues within normal limits.  Calvarium intact. Sinuses/Orbits: Globes and orbital soft tissues within normal limits. Scattered mucosal thickening noted within the ethmoidal air cells and right maxillary sinus. Few small air-fluid levels noted. Mastoid air cells are clear. Patient is intubated. Other: None. CTA NECK FINDINGS Aortic arch: Visualized aortic arch of normal caliber. Bovine arch with common origin of the right brachiocephalic and left common carotid artery noted. Extensive noncalcified plaque seen throughout the visualized arch and about the origin of the great vessels without hemodynamically significant stenosis. Irregular soft plaque and/or thrombus protruding into the lumen at the origin of the right brachiocephalic artery (series 12, image 38), unchanged. 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 Subclavian arteries otherwise irregular but patent without flow-limiting stenosis. Right carotid system: Right common carotid artery patent from its origin to the bifurcation without stenosis. Mild scattered plaque about the right bifurcation/proximal right ICA without hemodynamically significant stenosis. Right ICA irregular but patent to the skull base without stenosis, dissection, or occlusion. Left carotid system: Multifocal atheromatous  irregularity within the left common carotid artery without stenosis. Interval placement of a vascular stent, proximal aspect at the origin of the left ICA. Mild-to-moderate stenoses involving the mid and distal aspect of the stent, measuring up to approximately 50% by NASCET criteria. Widely patent flow otherwise seen through the stent. No intraluminal thrombus or other complication. Left ICA irregular but otherwise widely patent to the skull base without stenosis, dissection, or occlusion. Vertebral arteries: Both vertebral arteries arise  from the subclavian arteries. Vertebral arteries remain widely patent within the neck without stenosis, dissection or occlusion. Skeleton: No acute osseous abnormality. No discrete osseous lesions. Moderate cervical spondylosis noted at C5-6. Patient is edentulous. Other neck: Endotracheal and enteric tubes in place. Postoperative changes from recent cutdown and open exposure of the left common carotid artery seen at the lower anterior left neck. Percutaneous drain remains in place within this region with associated scattered foci of soft tissue emphysema. Associated postoperative swelling and blood products present within this region as well. Small linear contrast blush within this region adjacent to the surgical drain likely reflects a small amount of persistent bleeding, likely venous in nature (series 5, image 36). No other active contrast extravasation. Upper chest: Layering bilateral pleural effusions with associated atelectasis partially visualized. Paraseptal emphysematous changes noted at the lung apices. Median sternotomy partially visualized. Review of the MIP images confirms the above findings CTA HEAD FINDINGS Anterior circulation: Petrous segments are widely patent. Scattered atherosclerotic change throughout the carotid siphons with associated moderate multifocal narrowing, unchanged. Left A1 widely patent. Hypoplastic right A1. Normal anterior communicating artery.  Partially azygos ACA noted. 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 No visible neck remnant identified. Grossly patent flow through the adjacent stent. Interval placement of a vascular stent across the previously seen severe left M1 stenosis. Patent flow is seen through the stent. Left MCA branches perfused distally. Mild stenosis involving the proximal right M1 segment noted, stable. Distal right MCA branches well perfused and stable from previous. Posterior circulation: Vertebral arteries remain widely patent to the vertebrobasilar junction. Posterior inferior cerebral arteries patent bilaterally. Basilar diffusely diminutive with associated mild multifocal narrowing. Superior cerebral arteries patent bilaterally. PCA supplied via hypoplastic P1 segments as well as robust bilateral posterior communicating arteries. Prominent atherosclerotic change throughout both PCAs with associated moderate to severe multifocal stenoses, right worse than left, grossly stable. Venous sinuses: Grossly patent allowing for timing of the contrast bolus Anatomic variants: Predominant fetal type origin of the PCAs. Hypoplastic right A1 segment. Review of the MIP images confirms the above findings CT Brain Perfusion Findings: CBF (<30%) Volume: 41mL Perfusion (Tmax>6.0s) volume: 11mL Mismatch Volume: 53mL Infarction Location:Negative CT perfusion for acute ischemia. On source perfusion maps, there is increased cerebral blood volume and cerebral blood flow with mildly decreased mean transit time and T-max, likely reflecting improved cerebrovascular flow to the left cerebral hemisphere due to interval stenting of the left ICA and MCA. No other perfusion abnormality. IMPRESSION: CT HEAD IMPRESSION: 1. No acute intracranial abnormality. 2. 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 3. 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 CTA HEAD AND NECK  IMPRESSION: 1. Negative CTA for emergent large vessel occlusion. 2. Interval stenting at the proximal left ICA and left M1 segment without complication. Patent flow seen through both stents. 3. 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 No residual neck remnant identified. 4. Postoperative changes from interval cutdown for left carotid artery exposure/access. Surgical drain remains in place. Small blush of contrast adjacent to the drain consistent with a small focus of active bleeding, likely venous in nature. 5. Otherwise stable CTA with extensive atherosclerotic change elsewhere throughout the major arterial vasculature of the head and neck. CT PERFUSION IMPRESSION: 1. Negative CT perfusion for acute core infarct. 2. Increased cerebral blood flow and blood volume with decreased T-max within the left cerebral hemisphere, reflecting improved vascular flow to the left MCA distribution due  to the left ICA and MCA stents. 3. Otherwise negative CT perfusion, with no other perfusion abnormality. These results were communicated to Dr. Lorraine Lax at 9:30 pmon 1/27/2021by text page via the [facility] messaging system. Electronically Signed   By: Jeannine Boga M.D.   On: 03/26/2019 22:49   DG Chest Port 1 View  Result Date: 03/26/2019 CLINICAL DATA:  Endotracheal tube placement. Hypertension. Coronary artery disease. EXAM: PORTABLE CHEST 1 VIEW COMPARISON:  03/17/2019 from [facility]: Prior median sternotomy. Endotracheal tube terminates 5.5 cm above carina. Nasogastric terminates at the body of the stomach with the side port likely just at the gastroesophageal junction. Numerous leads and wires project over the chest. Cardiomegaly accentuated by AP portable technique. The apices are partially excluded. No pleural fluid. Low lung volumes with resultant pulmonary interstitial prominence. New subsegmental atelectasis at the left lung base. IMPRESSION: Nasogastric tube  borderline low in position.  Consider advancement. Otherwise, appropriate position of support apparatus. Diminished lung volumes with new left lower lobe subsegmental atelectasis. Cardiomegaly without congestive failure. Electronically Signed   By: Abigail Miyamoto M.D.   On: 03/26/2019 16:16   DG C-Arm 1-60 Min-No Report  Result Date: 03/26/2019 Fluoroscopy was utilized by the requesting physician.  No radiographic interpretation.   VAS US CAROTID  Result Date: 03/27/2019 Carotid Arterial Duplex Study Indications:       CVA, Syncope, Left stent and possible hematoma. Risk Factors:      Hypertension, current smoker, coronary artery disease. Limitations        Today's exam was limited due to the post surgical status of                    the patient, patient on a ventilator and movement. Comparison Study:  No prior study on file for comparison. Performing Technologist: Sharion Dove RVS  Examination Guidelines: A complete evaluation includes B-mode imaging, spectral Doppler, color Doppler, and power Doppler as needed of all accessible portions of each vessel. Bilateral testing is considered an integral part of a complete examination. Limited examinations for reoccurring indications may be performed as noted.  Left Carotid Findings: +----------+--------+--------+--------+------------------+------------------+           PSV cm/sEDV cm/sStenosisPlaque DescriptionComments           +----------+--------+--------+--------+------------------+------------------+ CCA Prox  94      19                                intimal thickening +----------+--------+--------+--------+------------------+------------------+ CCA Distal81      15                                intimal thickening +----------+--------+--------+--------+------------------+------------------+ ICA Prox                                            stent               +----------+--------+--------+--------+------------------+------------------+ ICA Distal52      22                                                   +----------+--------+--------+--------+------------------+------------------+ ECA  55      12                                                   +----------+--------+--------+--------+------------------+------------------+ +----------+--------+--------+--------------+-------------------+           PSV cm/sEDV cm/sDescribe      Arm Pressure (mmHG) +----------+--------+--------+--------------+-------------------+ Subclavian                Not identified                    +----------+--------+--------+--------------+-------------------+ +---------+--------+--------+--------------+ VertebralPSV cm/sEDV cm/sNot identified +---------+--------+--------+--------------+  Left Stent(s): +--------------+--+--++++ Prox to Stent 6211 +--------------+--+--++++ Proximal Stent8032 +--------------+--+--++++ Mid Stent     7227 +--------------+--+--++++    Summary:  Left Carotid: Patent stent. 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 *See table(s) above for measurements and observations.     Preliminary

## 2019-03-27 NOTE — Progress Notes (Addendum)
  Progress Note    03/27/2019 8:42 AM 1 Day Post-Op  Subjective: Remains intubated and sedated.   Vitals:   03/27/19 0753 03/27/19 0800  BP:  131/85  Pulse:    Resp: 20 16  Temp:  98.5 F (36.9 C)  SpO2: 100%    Physical Exam: Lungs: Mechanical ventilation Incisions: Left neck incision with some fullness but soft; 30 cc sanguinous drainage output from JP overnight Neurologic: Sedated  CBC    Component Value Date/Time   WBC 6.3 03/27/2019 0537   RBC 2.76 (L) 03/27/2019 0537   HGB 8.8 (L) 03/27/2019 0537   HCT 25.8 (L) 03/27/2019 0537   PLT 173 03/27/2019 0537   MCV 93.5 03/27/2019 0537   MCH 31.9 03/27/2019 0537   MCHC 34.1 03/27/2019 0537   RDW 15.9 (H) 03/27/2019 0537   LYMPHSABS 0.8 03/27/2019 0537   MONOABS 0.7 03/27/2019 0537   EOSABS 0.0 03/27/2019 0537   BASOSABS 0.0 03/27/2019 0537    BMET    Component Value Date/Time   NA 137 03/27/2019 0537   K 3.8 03/27/2019 0537   CL 109 03/27/2019 0537   CO2 20 (L) 03/27/2019 0537   GLUCOSE 121 (H) 03/27/2019 0537   BUN 9 03/27/2019 0537   CREATININE 0.96 03/27/2019 0537   CALCIUM 7.8 (L) 03/27/2019 0537   GFRNONAA >60 03/27/2019 0537   GFRAA >60 03/27/2019 0537    INR    Component Value Date/Time   INR 1.0 03/21/2019 0413     Intake/Output Summary (Last 24 hours) at 03/27/2019 0842 Last data filed at 03/27/2019 0800 Gross per 24 hour  Intake 4969.66 ml  Output 1900 ml  Net 3069.66 ml     Assessment/Plan:  69 y.o. male is s/p open exposure of left common carotid artery for neuro IR intervention and subsequent primary closure as well as placement of JP drain  1 Day Post-Op   Patient remains intubated and sedated this morning 30 cc sanguinous output overnight from JP drain; continue another day Continue IV heparin per Neurology    Dagoberto Ligas, PA-C [facility] [phone] 03/27/2019 8:42 AM   I have seen and evaluated the patient. I agree with the PA note as  documented above.  Postop day 1 status post left common carotid artery exposure for sheath placement and neuro interventional procedure with IR.  Patient underwent stroke work-up last night but now is moving all his extremities when sedation weaned.  No acute findings on stroke work-up.  He has a little fullness at his incision above the clavicle with small hematoma but there is a drain here that put out 30 mL overnight.  We will leave drain for 1 more day.  Okay for extubation from our standpoint.  Marty Heck, MD [facility] [place] Office: [phone]

## 2019-03-27 NOTE — Progress Notes (Signed)
VASCULAR LAB PRELIMINARY  PRELIMINARY  PRELIMINARY  PRELIMINARY  Left carotid duplex completed.    Preliminary report:  See CV proc for preliminary results.   Errika Narvaiz, RVT 03/27/2019, 9:08 AM

## 2019-03-27 NOTE — Progress Notes (Signed)
@  2000 assessment propofol was weaned down for neuro exam. Patient noted to be localizing LUE & LLE, but only a flicker to pain on RUE/RLE. Opens eyes but unable to follow commands. PERRLA. Left neck surgical site unchanged from previous assessment. Per notes prior to procedure strength was equal in all extremities. Notified Dr Estanislado Pandy who wanted RN to contact Dr Lorraine Lax, who then ordered stat CTs and met patient down in CT scanner. Patient was then transported back up to unit. Sedation resumed due to patient coughing/gagging on ETT. Heparin gtt also resumed as CT negative for hemorrhage. Will continue to monitor.

## 2019-03-28 ENCOUNTER — Inpatient Hospital Stay (HOSPITAL_COMMUNITY): Payer: BC Managed Care – PPO

## 2019-03-28 ENCOUNTER — Encounter (HOSPITAL_COMMUNITY): Payer: Self-pay | Admitting: Internal Medicine

## 2019-03-28 DIAGNOSIS — I10 Essential (primary) hypertension: Secondary | ICD-10-CM

## 2019-03-28 DIAGNOSIS — I6522 Occlusion and stenosis of left carotid artery: Secondary | ICD-10-CM

## 2019-03-28 DIAGNOSIS — I69391 Dysphagia following cerebral infarction: Secondary | ICD-10-CM

## 2019-03-28 DIAGNOSIS — I6302 Cerebral infarction due to thrombosis of basilar artery: Secondary | ICD-10-CM

## 2019-03-28 DIAGNOSIS — I6602 Occlusion and stenosis of left middle cerebral artery: Secondary | ICD-10-CM

## 2019-03-28 DIAGNOSIS — N179 Acute kidney failure, unspecified: Secondary | ICD-10-CM

## 2019-03-28 DIAGNOSIS — I671 Cerebral aneurysm, nonruptured: Secondary | ICD-10-CM

## 2019-03-28 DIAGNOSIS — I513 Intracardiac thrombosis, not elsewhere classified: Secondary | ICD-10-CM

## 2019-03-28 DIAGNOSIS — I25810 Atherosclerosis of coronary artery bypass graft(s) without angina pectoris: Secondary | ICD-10-CM

## 2019-03-28 DIAGNOSIS — R7303 Prediabetes: Secondary | ICD-10-CM

## 2019-03-28 DIAGNOSIS — I161 Hypertensive emergency: Secondary | ICD-10-CM

## 2019-03-28 LAB — GLUCOSE, CAPILLARY
Glucose-Capillary: 79 mg/dL (ref 70–99)
Glucose-Capillary: 90 mg/dL (ref 70–99)
Glucose-Capillary: 82 mg/dL (ref 70–99)
Glucose-Capillary: 83 mg/dL (ref 70–99)
Glucose-Capillary: 80 mg/dL (ref 70–99)
Glucose-Capillary: 87 mg/dL (ref 70–99)
Glucose-Capillary: 115 mg/dL — ABNORMAL HIGH (ref 70–99)

## 2019-03-28 LAB — PHOSPHORUS: Phosphorus: 2.9 mg/dL (ref 2.5–4.6)

## 2019-03-28 LAB — TRIGLYCERIDES: Triglycerides: 186 mg/dL — ABNORMAL HIGH (ref <150)

## 2019-03-28 LAB — CBC
HCT: 24.9 % — ABNORMAL LOW (ref 39.0–52.0)
Hemoglobin: 8.2 g/dL — ABNORMAL LOW (ref 13.0–17.0)
MCH: 31.2 pg (ref 26.0–34.0)
MCHC: 32.9 g/dL (ref 30.0–36.0)
MCV: 94.7 fL (ref 80.0–100.0)
Platelets: 200 10*3/uL (ref 150–400)
RBC: 2.63 MIL/uL — ABNORMAL LOW (ref 4.22–5.81)
RDW: 15.7 % — ABNORMAL HIGH (ref 11.5–15.5)
WBC: 7.1 10*3/uL (ref 4.0–10.5)
nRBC: 0 % (ref 0.0–0.2)

## 2019-03-28 LAB — HEPATIC FUNCTION PANEL
ALT: 19 U/L (ref 0–44)
AST: 34 U/L (ref 15–41)
Albumin: 2.3 g/dL — ABNORMAL LOW (ref 3.5–5.0)
Alkaline Phosphatase: 89 U/L (ref 38–126)
Bilirubin, Direct: 0.2 mg/dL (ref 0.0–0.2)
Indirect Bilirubin: 0.4 mg/dL (ref 0.3–0.9)
Total Bilirubin: 0.6 mg/dL (ref 0.3–1.2)
Total Protein: 6.1 g/dL — ABNORMAL LOW (ref 6.5–8.1)

## 2019-03-28 LAB — COMPREHENSIVE METABOLIC PANEL
ALT: 17 U/L (ref 0–44)
AST: 26 U/L (ref 15–41)
Albumin: 2.3 g/dL — ABNORMAL LOW (ref 3.5–5.0)
Alkaline Phosphatase: 71 U/L (ref 38–126)
Anion gap: 8 (ref 5–15)
BUN: 11 mg/dL (ref 8–23)
CO2: 20 mmol/L — ABNORMAL LOW (ref 22–32)
Calcium: 7.8 mg/dL — ABNORMAL LOW (ref 8.9–10.3)
Chloride: 110 mmol/L (ref 98–111)
Creatinine, Ser: 1.36 mg/dL — ABNORMAL HIGH (ref 0.61–1.24)
GFR calc Af Amer: >60 mL/min (ref >60)
GFR calc non Af Amer: 52 mL/min — ABNORMAL LOW (ref >60)
Glucose, Bld: 85 mg/dL (ref 70–99)
Potassium: 3.2 mmol/L — ABNORMAL LOW (ref 3.5–5.1)
Sodium: 138 mmol/L (ref 135–145)
Total Bilirubin: 0.7 mg/dL (ref 0.3–1.2)
Total Protein: 5.6 g/dL — ABNORMAL LOW (ref 6.5–8.1)

## 2019-03-28 LAB — HEPARIN LEVEL (UNFRACTIONATED): Heparin Unfractionated: 0.25 IU/mL — ABNORMAL LOW (ref 0.30–0.70)

## 2019-03-28 LAB — MAGNESIUM: Magnesium: 1.7 mg/dL (ref 1.7–2.4)

## 2019-03-28 MED ORDER — POTASSIUM CHLORIDE CRYS ER 20 MEQ PO TBCR
40.0000 meq | EXTENDED_RELEASE_TABLET | Freq: Once | ORAL | Status: AC
  Administered 2019-03-28: 11:00:00 40 meq via ORAL
  Filled 2019-03-28: qty 2

## 2019-03-28 MED ORDER — APIXABAN 5 MG PO TABS
5.0000 mg | ORAL_TABLET | Freq: Two times a day (BID) | ORAL | Status: DC
  Administered 2019-03-28 – 2019-04-03 (×13): 5 mg via ORAL
  Filled 2019-03-28 (×13): qty 1

## 2019-03-28 MED ORDER — PANTOPRAZOLE SODIUM 40 MG PO TBEC
40.0000 mg | DELAYED_RELEASE_TABLET | Freq: Every day | ORAL | Status: DC
  Administered 2019-03-28 – 2019-04-02 (×6): 40 mg via ORAL
  Filled 2019-03-28 (×6): qty 1

## 2019-03-28 MED ORDER — HYDRALAZINE HCL 20 MG/ML IJ SOLN
10.0000 mg | INTRAMUSCULAR | Status: DC | PRN
  Administered 2019-03-31: 03:00:00 10 mg via INTRAVENOUS
  Administered 2019-04-01 – 2019-04-02 (×2): 20 mg via INTRAVENOUS
  Filled 2019-03-28 (×3): qty 1

## 2019-03-28 MED ORDER — LEVOFLOXACIN 750 MG PO TABS
750.0000 mg | ORAL_TABLET | Freq: Every day | ORAL | Status: AC
  Administered 2019-03-28 – 2019-04-01 (×5): 750 mg via ORAL
  Filled 2019-03-28 (×5): qty 1

## 2019-03-28 MED ORDER — ASPIRIN 81 MG PO CHEW
81.0000 mg | CHEWABLE_TABLET | Freq: Every day | ORAL | Status: DC
  Administered 2019-03-28: 81 mg via ORAL

## 2019-03-28 MED ORDER — MAGNESIUM SULFATE 2 GM/50ML IV SOLN
2.0000 g | Freq: Once | INTRAVENOUS | Status: AC
  Administered 2019-03-28: 11:00:00 2 g via INTRAVENOUS
  Filled 2019-03-28: qty 50

## 2019-03-28 NOTE — Progress Notes (Addendum)
xxxx 

## 2019-03-28 NOTE — Discharge Instructions (Signed)
Information on my medicine - ELIQUIS (apixaban)  This medication education was reviewed with me or my healthcare representative as part of my discharge preparation.  Why was Eliquis prescribed for you? 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  What do You need to know about Eliquis ? The starting dose is one 5 mg tablet taken TWICE daily.  Eliquis may be taken with or without food.   Try to take the dose about the same time in the morning and in the evening. If you have difficulty swallowing the tablet whole please discuss with your pharmacist how to take the medication safely.  Take Eliquis exactly as prescribed and DO NOT stop taking Eliquis without talking to the doctor who prescribed the medication.  Stopping may increase your risk of developing a new blood clot.  Refill your prescription before you run out.  After discharge, you should have regular check-up appointments with your healthcare provider that is prescribing your Eliquis.    What do you do if you miss a dose? If a dose of ELIQUIS is not taken at the scheduled time, take it as soon as possible on the same day and twice-daily administration should be resumed. The dose should not be doubled to make up for a missed dose.  Important Safety Information A possible side effect of Eliquis is bleeding. You should call your healthcare provider right away if you experience any of the following: ? Bleeding from an injury or your nose that does not stop. ? Unusual colored urine (red or dark brown) or unusual colored stools (red or black). ? Unusual bruising for unknown reasons. ? A serious fall or if you hit your head (even if there is no bleeding).  Some medicines may interact with Eliquis and might increase your risk of bleeding or clotting while on Eliquis. To help avoid this, consult your healthcare provider or pharmacist prior to using any new  prescription or non-prescription medications, including herbals, vitamins, non-steroidal anti-inflammatory drugs (NSAIDs) and supplements.  This website has more information on Eliquis (apixaban): http://[email or web address].com/eliquis/home

## 2019-03-28 NOTE — Progress Notes (Signed)
NAME:  Jeremy Sherman, MRN:  [id number], DOB:  1950/09/09, LOS: 8 ADMISSION DATE:  03/20/2019, CONSULTATION DATE:  1/27 REFERRING MD:  Estanislado Pandy, CHIEF COMPLAINT:  Stroke; ventilator management s/p Neuro IR   Brief History    69 year old male patient with history as mentioned below presented to [facility] ER on 1/18 with chief complaint of dizziness and syncopal episode witnessed at primary care provider's office, with associated unresponsiveness, diaphoresis and hypotension. 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 Neuro and neuro IR consulted.  Had cerebral angiogram 1/22 post procedure had cold right foot. Vascular surg consulted. ABI and dopplers showed intact flow and was felt represented chronic changes. Recommended angiogram if symptomatic.  To IR on 1/27 for planned intervention involving what was felt to be probably dissecting ACA aneurysm, and endovascular treatment of left MCA stenosis. PCCM asked to assist w/ post-operative care.    Past Medical History  Hypothyroidism, hypertension, hyperlipidemia, prediabetes, coronary artery disease with prior bypass surgery 07/02/2007, smoker. Note: [facility] 1/12 through 1/14 with dizziness felt to be secondary to hypertensive urgency  Significant Hospital Events   1/18 MRI showed early subacute infarct within the right cerebellum and right brachium pontis, also acute left Puctate and left parietal lobe infarct. 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 ECHO showed  TEE EF 45-50% , left atrial appendage thrombus, and mild MR. - > ADMIT    1/21 - TEE 1/21- OSH - Left atrial appendage thrombus   1/22 both neuro and N-IR consulted. Started on asa and plavix. As well as high dose  statin. As well as therapeutic AC.  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 3. Approx 75% LT ICA prox stenosis. Vascular surg consulted s/p angiogram lost pulses to right foot. Duplex and ABIs showed CFA and profunda open. felt this all represented chronic disease.  Recommended A-gram if symptomatic.   1/22: ABI right foot: Pt common femoral and profunda open. Most likely this is all chronic disease (Fields)   1/23 followed by stroke team recommending BP goal 130-150, anticoagulation and further cards eval to look for AF given the LA thrombus.   1/26 - Levaquin for abnormal UA by [facility]  1/27 To IR for planned intervention involving what was felt to be probably dissecting ACA aneurysm, and endovascular treatment of left MCA stenosis. PCCM asked to assist w/ post-operative care.  After undergoing stent angioplasty of left MCA and stent assisted coiling of left ACA aneurysm.  Went to general OR for closure of carotid access site by vascular surgery,  -         S/P Lt common carotid arteriogram followed by Stent angioplasty of Lt MCA M 1 seg ,Stent assisted coiling of LT ACA pericallosal aneurysm and Lt ICA prox due  To sig stenosis precluding safe passage of guide catheter.  Post procedure CT brain ned for hemorrhage or mass effect.         1/28 -  Qtc 464msec per tech. Cuff leak Positive. No active bleeding from carotid drain. Light WUA assessment earlier by RN ->  Followed commands and neuro intact. Carotid US still pending. Off neo, On cleviprex and neo - target sbp 10-140. ON  Iv heparin gtt => EXTUBATED  Consults:  Neuro   Procedures:  Cerebral angiogram 1/22:  severe 90% left MCA M1 stenosis.  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  75% left ICA proximal stenosis.  1/27: stent angioplasty of left MCA and stent assisted coiling of left ACA aneurysm.  Went to general OR for closure of  carotid access site by vascular surgery,  1/27 - LUE MIDLINE  1/29  1/27  - left radial aline > 1/29  1/27  Left carotid drain - 1/29  1/28 - rUE PICC    ? Date - foley   Significant Diagnostic Tests:  MRI head - OSH - anterior cerebral artery pericallosal aneurysm. Showed an early/ subacute infarct with in the right cerebellum in the right brachium pontis  TEE 1/21- OSH - Left atrial appendage thrombus   1/22: ABI right foot: Pt common femoral and profunda open. Most likely this is all chronic disease (Fields)  Micro Data:   1/25 - covid neg 1/27 MSSA neg 1/27 - MRSA pcr neg  xxxxxxxxxxx 1/29 - urine culture     Antimicrobials:   1/26 - levaquin >> (1/31 by radiology for abn UA)  Interim history/subjective:    03/28/2019 -remains extubated. Last night back on cleviprex. So ccm rounding again. RN indicated that neuro-IR ok with SBP < 150. Cleviprex being weaned off again. QTc 421msec per tech. On levaquin. Left carotid drain off and vvs signed off. Remains on IV heparin for LAA Thrombus. Also on DAPT - aspirin and brilinta. Urine with gross hematuria + -> per neuro Dr Erlinda Hong -> ok to change heparin to eliquis and then dc asa but continue brilinta  Objective   Blood pressure (!) 122/54, pulse 72, temperature 99.1 F (37.3 C), temperature source Axillary, resp. rate 16, height 6\' 5"  (1.956 m), weight 106.1 kg, SpO2 99 %.    Vent Mode: PSV;CPAP PEEP:  [5 cmH20] 5 cmH20 Pressure Support:  [5 cmH20] 5 cmH20   Intake/Output Summary (Last 24 hours) at 03/28/2019 0953 Last data filed at 03/28/2019 0545 Gross per 24 hour  Intake 1980.12 ml  Output 2180 ml  Net -199.88 ml   Filed Weights   03/20/19 1842  Weight: 106.1 kg     General Appearance:  Looks better and stable. Sitting on bed Head:  Normocephalic, without obvious abnormality, atraumatic Eyes:  PERRL - yes, conjunctiva/corneas - muddy     Ears:  Normal external ear canals, both ears Nose:  G tube - no    Throat:  ETT TUBE - no , OG tube - no Neck:  Supple,  No enlargement/tenderness/nodules. LEFT CAROTID DRAIN - REMOVED Lungs: Clear to auscultation bilaterally,  Heart:  S1 and S2 normal, no murmur, CVP - no.  Pressors - no Abdomen:  Soft, no masses, no organomegaly Genitalia / Rectal:  Not done Extremities:  Extremities- intact. Devices in LUE and RUE + Skin:  ntact in exposed areas . Sacral area - not examined Neurologic:  Sedation - none -> RASS - +1 . Moves all 4s - yes. CAM-ICU - not fully tested . 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 No evidence of neck hematoma causing airway compromise  Plan INcentive spirometry   Fluid and electrolyte imbalance:   - 03/28/2019 - mild low K amd low mag  Plan Replete K and mag Monitor  AKI   03/28/2019 - new and mild today  Plan  - monitor - maintain Bp/hr  Mild acute anemia. No evidence of bleeding  Other than hematuria but has has had significant hgb drop since admit.  03/28/2019 -hematuria  Plan - PRBC for hgb </= 6.9gm%    - exceptions are   -  if ACS susepcted/confirmed then transfuse for hgb </= 8.0gm%,  or    -  active bleeding with hemodynamic instability, then transfuse regardless of hemoglobin value   At at all times try to  transfuse 1 unit prbc as possible with exception of active hemorrhage    DM and hyperglycemia  Plan ssi   History of BPH - started on flomax in hospitl 1/24-1/27 Possible UTI 03/25/2019 - on levaquin 5d  - on foley currently. Has hematuria. Abnormal UA on 1/26 and possitble UTI  Plan Foley catheter in place - aim to remove after hematuria subsides or if culture positive Check urine culture Resume Flomax at some point when foley comes out - might need this at discharge Check PSA   Best practice:  Diet: d1 diet Pain/Anxiety/Delirium protocol (if indicated):  VAP protocol (if indicated): hob > 30 degress DVT prophylaxis : anticoagulation as above GI prophylaxis: PPI Glucose control: SSI  Mobility: Bedrest Code Status: FULL CODE  Family Communication: none at bedside Disposition: BAck on CCM service -> keep in  ICU monitoring -> to progressive once consistently off cleviprex and then give back to triad      ATTESTATION & SIGNATURE   The patient Easten Savarino is critically ill with multiple organ systems failure and requires high complexity decision making for assessment and support, frequent evaluation and titration of therapies, application of advanced monitoring technologies and extensive interpretation of multiple databases.   Critical Care Time devoted to patient care services described in this note is  31  Minutes. This time reflects time of care of this signee Dr Brand Males. This critical care time does not reflect procedure time, or teaching time or supervisory time of PA/NP/Med student/Med Resident etc but could involve care discussion time     Dr. Brand Males, M.D., [facility]C.P Pulmonary and Critical Care Medicine Staff Physician [facility] Pager: [phone], If no answer or between  15:00h - 7:00h: call [phone]  03/28/2019 9:53 AM    LABS    PULMONARY Recent Labs  Lab 03/26/19 0737  03/26/19 1250 03/26/19 1406 03/26/19 1648  PHART  --   --  7.348* 7.385  PCO2ART  --   --  46.4 41.2  PO2ART  --   --  318.0* 386.0*  HCO3  --   --  25.5 24.8  TCO2 23 24 27 26   O2SAT  --   --  100.0 100.0    CBC Recent Labs  Lab 03/26/19 0444  03/26/19 0737 03/26/19 1648 03/27/19 0537 03/28/19 0633  HGB 9.6*   < > 8.2* 8.8* 8.2*  HCT 28.7*   < > 24.0* 25.8* 24.9*  WBC 4.7  --   --  6.3 7.1  PLT 195  --   --  173 200   < > = values in this interval not displayed.    COAGULATION No results for input(s): INR in the last 168 hours.  CARDIAC  No results for input(s): TROPONINI in the last 168 hours. No results for input(s): PROBNP in the last 168 hours.   CHEMISTRY Recent Labs  Lab 03/24/19 0238 03/24/19 0238 03/25/19 0225 03/25/19 0225 03/26/19 0444 03/26/19 0444 03/26/19 0737 03/26/19 0737 03/26/19 1250 03/26/19 1250 03/26/19 1406 03/26/19 1406 03/26/19 1648 03/26/19 1648 03/27/19 0537 03/28/19 0633  NA 136   < > 135   < > 134*   < > 138   < > 136  --  138  --  136  --  137 138  K 3.8   < > 3.5   < > 3.4*   < > 3.4*   < > 3.9   < > 3.9   < > 3.8   < > 3.8 3.2*  CL 102   < > 104   < > 103  --  105  --  104  --   --   --   --   --  109 110  CO2 22  --  21*  --  22  --   --   --   --   --   --   --   --   --  20* 20*  GLUCOSE 109*   < > 106*   < > 105*  --  96  --  136*  --   --   --   --   --  121* 85  BUN 18   < > 14   < > 11  --  10  --  10  --   --   --   --   --  9 11  CREATININE 1.52*   < > 1.22   < > 1.19  --  1.00  --  1.00  --   --   --   --   --  0.96 1.36*  CALCIUM 8.5*  --  8.4*  --  8.4*  --   --   --   --   --   --   --   --   --  7.8* 7.8*  MG  --   --   --   --   --   --   --   --   --   --   --   --   --   --   --  1.7  PHOS  --   --   --   --   --   --   --   --   --   --   --   --   --   --   --  2.9   < > = values in this interval not displayed.   Estimated Creatinine Clearance: 63.7 mL/min (A) (by C-G formula based on SCr of 1.36 mg/dL  (H)).   LIVER Recent Labs  Lab 03/22/19 0112 03/28/19 0633  AST 22 26  ALT 15 17  ALKPHOS 64 71  BILITOT 0.7 0.7  PROT 7.6  5.6*  ALBUMIN 3.5 2.3*     INFECTIOUS No results for input(s): LATICACIDVEN, PROCALCITON in the last 168 hours.   ENDOCRINE CBG (last 3)  Recent Labs    03/27/19 1934 03/27/19 2315 03/28/19 0844  GLUCAP 90 82 83         IMAGING x48h  - image(s) personally visualized  -   highlighted in bold CT ANGIO HEAD W OR WO CONTRAST  Result Date: 03/26/2019 CLINICAL DATA:  Follow-up examination for acute stroke, recent ICA and MCA stenting and aneurysm coiling EXAM: CT ANGIOGRAPHY HEAD AND NECK CT PERFUSION BRAIN TECHNIQUE: Multidetector CT imaging of the head and neck was performed using the standard protocol during bolus administration of intravenous contrast. Multiplanar CT image reconstructions and MIPs were obtained to evaluate the vascular anatomy. Carotid stenosis measurements (when applicable) are obtained utilizing NASCET criteria, using the distal internal carotid diameter as the denominator. Multiphase CT imaging of the brain was performed following IV bolus contrast injection. Subsequent parametric perfusion maps were calculated using RAPID software. CONTRAST:  172mL OMNIPAQUE IOHEXOL 350 MG/ML SOLN COMPARISON:  Comparison made with prior CTA from 03/19/2019. FINDINGS: CT HEAD FINDINGS Brain: Generalized age-related cerebral atrophy with chronic small vessel ischemic disease. No acute intracranial hemorrhage. No acute large vessel territory infarct. No mass lesion, midline shift or mass effect. No hydrocephalus. No extra-axial fluid collection. 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 Vascular stent has been placed within the left M1 segment. No hyperdense vessel. Scattered vascular calcifications noted within the carotid siphons. Skull: Scalp soft tissues within normal limits.  Calvarium intact. Sinuses/Orbits: Globes  and orbital soft tissues within normal limits. Scattered mucosal thickening noted within the ethmoidal air cells and right maxillary sinus. Few small air-fluid levels noted. Mastoid air cells are clear. Patient is intubated. Other: None. CTA NECK FINDINGS Aortic arch: Visualized aortic arch of normal caliber. Bovine arch with common origin of the right brachiocephalic and left common carotid artery noted. Extensive noncalcified plaque seen throughout the visualized arch and about the origin of the great vessels without hemodynamically significant stenosis. Irregular soft plaque and/or thrombus protruding into the lumen at the origin of the right brachiocephalic artery (series 12, image 38), unchanged. 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 Subclavian arteries otherwise irregular but patent without flow-limiting stenosis. Right carotid system: Right common carotid artery patent from its origin to the bifurcation without stenosis. Mild scattered plaque about the right bifurcation/proximal right ICA without hemodynamically significant stenosis. Right ICA irregular but patent to the skull base without stenosis, dissection, or occlusion. Left carotid system: Multifocal atheromatous irregularity within the left common carotid artery without stenosis. Interval placement of a vascular stent, proximal aspect at the origin of the left ICA. Mild-to-moderate stenoses involving the mid and distal aspect of the stent, measuring up to approximately 50% by NASCET criteria. Widely patent flow otherwise seen through the stent. No intraluminal thrombus or other complication. Left ICA irregular but otherwise widely patent to the skull base without stenosis, dissection, or occlusion. Vertebral arteries: Both vertebral arteries arise from the subclavian arteries. Vertebral arteries remain widely patent within the neck without stenosis, dissection or occlusion. Skeleton: No acute osseous abnormality. No  discrete osseous lesions. Moderate cervical spondylosis noted at C5-6. Patient is edentulous. Other neck: Endotracheal and enteric tubes in place. Postoperative changes from recent cutdown and open exposure of the left common carotid artery seen at the lower anterior left neck. Percutaneous drain remains in place within this region with  associated scattered foci of soft tissue emphysema. Associated postoperative swelling and blood products present within this region as well. Small linear contrast blush within this region adjacent to the surgical drain likely reflects a small amount of persistent bleeding, likely venous in nature (series 5, image 36). No other active contrast extravasation. Upper chest: Layering bilateral pleural effusions with associated atelectasis partially visualized. Paraseptal emphysematous changes noted at the lung apices. Median sternotomy partially visualized. Review of the MIP images confirms the above findings CTA HEAD FINDINGS Anterior circulation: Petrous segments are widely patent. Scattered atherosclerotic change throughout the carotid siphons with associated moderate multifocal narrowing, unchanged. Left A1 widely patent. Hypoplastic right A1. Normal anterior communicating artery. Partially azygos ACA noted. 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 No visible neck remnant identified. Grossly patent flow through the adjacent stent. Interval placement of a vascular stent across the previously seen severe left M1 stenosis. Patent flow is seen through the stent. Left MCA branches perfused distally. Mild stenosis involving the proximal right M1 segment noted, stable. Distal right MCA branches well perfused and stable from previous. Posterior circulation: Vertebral arteries remain widely patent to the vertebrobasilar junction. Posterior inferior cerebral arteries patent bilaterally. Basilar diffusely diminutive with associated mild multifocal narrowing.  Superior cerebral arteries patent bilaterally. PCA supplied via hypoplastic P1 segments as well as robust bilateral posterior communicating arteries. Prominent atherosclerotic change throughout both PCAs with associated moderate to severe multifocal stenoses, right worse than left, grossly stable. Venous sinuses: Grossly patent allowing for timing of the contrast bolus Anatomic variants: Predominant fetal type origin of the PCAs. Hypoplastic right A1 segment. Review of the MIP images confirms the above findings CT Brain Perfusion Findings: CBF (<30%) Volume: 42mL Perfusion (Tmax>6.0s) volume: 40mL Mismatch Volume: 45mL Infarction Location:Negative CT perfusion for acute ischemia. On source perfusion maps, there is increased cerebral blood volume and cerebral blood flow with mildly decreased mean transit time and T-max, likely reflecting improved cerebrovascular flow to the left cerebral hemisphere due to interval stenting of the left ICA and MCA. No other perfusion abnormality. IMPRESSION: CT HEAD IMPRESSION: 1. No acute intracranial abnormality. 2. 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 3. 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 CTA HEAD AND NECK IMPRESSION: 1. Negative CTA for emergent large vessel occlusion. 2. Interval stenting at the proximal left ICA and left M1 segment without complication. Patent flow seen through both stents. 3. 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 No residual neck remnant identified. 4. Postoperative changes from interval cutdown for left carotid artery exposure/access. Surgical drain remains in place. Small blush of contrast adjacent to the drain consistent with a small focus of active bleeding, likely venous in nature. 5. Otherwise stable CTA with extensive atherosclerotic change elsewhere throughout the major arterial vasculature of the head and neck. CT PERFUSION  IMPRESSION: 1. Negative CT perfusion for acute core infarct. 2. Increased cerebral blood flow and blood volume with decreased T-max within the left cerebral hemisphere, reflecting improved vascular flow to the left MCA distribution due to the left ICA and MCA stents. 3. Otherwise negative CT perfusion, with no other perfusion abnormality. These results were communicated to Dr. Lorraine Lax at 9:30 pmon 1/27/2021by text page via the [facility] messaging system. Electronically Signed   By: Jeannine Boga M.D.   On: 03/26/2019 22:49   CT ANGIO NECK W OR WO CONTRAST  Result Date: 03/26/2019 CLINICAL DATA:  Follow-up examination for acute stroke, recent ICA and  MCA stenting and aneurysm coiling EXAM: CT ANGIOGRAPHY HEAD AND NECK CT PERFUSION BRAIN TECHNIQUE: Multidetector CT imaging of the head and neck was performed using the standard protocol during bolus administration of intravenous contrast. Multiplanar CT image reconstructions and MIPs were obtained to evaluate the vascular anatomy. Carotid stenosis measurements (when applicable) are obtained utilizing NASCET criteria, using the distal internal carotid diameter as the denominator. Multiphase CT imaging of the brain was performed following IV bolus contrast injection. Subsequent parametric perfusion maps were calculated using RAPID software. CONTRAST:  165mL OMNIPAQUE IOHEXOL 350 MG/ML SOLN COMPARISON:  Comparison made with prior CTA from 03/19/2019. FINDINGS: CT HEAD FINDINGS Brain: Generalized age-related cerebral atrophy with chronic small vessel ischemic disease. No acute intracranial hemorrhage. No acute large vessel territory infarct. No mass lesion, midline shift or mass effect. No hydrocephalus. No extra-axial fluid collection. 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 Vascular stent has been placed within the left M1 segment. No hyperdense vessel. Scattered vascular calcifications noted within the carotid siphons. Skull:  Scalp soft tissues within normal limits.  Calvarium intact. Sinuses/Orbits: Globes and orbital soft tissues within normal limits. Scattered mucosal thickening noted within the ethmoidal air cells and right maxillary sinus. Few small air-fluid levels noted. Mastoid air cells are clear. Patient is intubated. Other: None. CTA NECK FINDINGS Aortic arch: Visualized aortic arch of normal caliber. Bovine arch with common origin of the right brachiocephalic and left common carotid artery noted. Extensive noncalcified plaque seen throughout the visualized arch and about the origin of the great vessels without hemodynamically significant stenosis. Irregular soft plaque and/or thrombus protruding into the lumen at the origin of the right brachiocephalic artery (series 12, image 38), unchanged. 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 Subclavian arteries otherwise irregular but patent without flow-limiting stenosis. Right carotid system: Right common carotid artery patent from its origin to the bifurcation without stenosis. Mild scattered plaque about the right bifurcation/proximal right ICA without hemodynamically significant stenosis. Right ICA irregular but patent to the skull base without stenosis, dissection, or occlusion. Left carotid system: Multifocal atheromatous irregularity within the left common carotid artery without stenosis. Interval placement of a vascular stent, proximal aspect at the origin of the left ICA. Mild-to-moderate stenoses involving the mid and distal aspect of the stent, measuring up to approximately 50% by NASCET criteria. Widely patent flow otherwise seen through the stent. No intraluminal thrombus or other complication. Left ICA irregular but otherwise widely patent to the skull base without stenosis, dissection, or occlusion. Vertebral arteries: Both vertebral arteries arise from the subclavian arteries. Vertebral arteries remain widely patent within the neck  without stenosis, dissection or occlusion. Skeleton: No acute osseous abnormality. No discrete osseous lesions. Moderate cervical spondylosis noted at C5-6. Patient is edentulous. Other neck: Endotracheal and enteric tubes in place. Postoperative changes from recent cutdown and open exposure of the left common carotid artery seen at the lower anterior left neck. Percutaneous drain remains in place within this region with associated scattered foci of soft tissue emphysema. Associated postoperative swelling and blood products present within this region as well. Small linear contrast blush within this region adjacent to the surgical drain likely reflects a small amount of persistent bleeding, likely venous in nature (series 5, image 36). No other active contrast extravasation. Upper chest: Layering bilateral pleural effusions with associated atelectasis partially visualized. Paraseptal emphysematous changes noted at the lung apices. Median sternotomy partially visualized. Review of the MIP images confirms the above findings CTA HEAD FINDINGS Anterior  circulation: Petrous segments are widely patent. Scattered atherosclerotic change throughout the carotid siphons with associated moderate multifocal narrowing, unchanged. Left A1 widely patent. Hypoplastic right A1. Normal anterior communicating artery. Partially azygos ACA noted. 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 No visible neck remnant identified. Grossly patent flow through the adjacent stent. Interval placement of a vascular stent across the previously seen severe left M1 stenosis. Patent flow is seen through the stent. Left MCA branches perfused distally. Mild stenosis involving the proximal right M1 segment noted, stable. Distal right MCA branches well perfused and stable from previous. Posterior circulation: Vertebral arteries remain widely patent to the vertebrobasilar junction. Posterior inferior cerebral arteries patent  bilaterally. Basilar diffusely diminutive with associated mild multifocal narrowing. Superior cerebral arteries patent bilaterally. PCA supplied via hypoplastic P1 segments as well as robust bilateral posterior communicating arteries. Prominent atherosclerotic change throughout both PCAs with associated moderate to severe multifocal stenoses, right worse than left, grossly stable. Venous sinuses: Grossly patent allowing for timing of the contrast bolus Anatomic variants: Predominant fetal type origin of the PCAs. Hypoplastic right A1 segment. Review of the MIP images confirms the above findings CT Brain Perfusion Findings: CBF (<30%) Volume: 58mL Perfusion (Tmax>6.0s) volume: 58mL Mismatch Volume: 55mL Infarction Location:Negative CT perfusion for acute ischemia. On source perfusion maps, there is increased cerebral blood volume and cerebral blood flow with mildly decreased mean transit time and T-max, likely reflecting improved cerebrovascular flow to the left cerebral hemisphere due to interval stenting of the left ICA and MCA. No other perfusion abnormality. IMPRESSION: CT HEAD IMPRESSION: 1. No acute intracranial abnormality. 2. 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 3. 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 CTA HEAD AND NECK IMPRESSION: 1. Negative CTA for emergent large vessel occlusion. 2. Interval stenting at the proximal left ICA and left M1 segment without complication. Patent flow seen through both stents. 3. 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 No residual neck remnant identified. 4. Postoperative changes from interval cutdown for left carotid artery exposure/access. Surgical drain remains in place. Small blush of contrast adjacent to the drain consistent with a small focus of active bleeding, likely venous in nature. 5. Otherwise stable CTA with extensive atherosclerotic change elsewhere  throughout the major arterial vasculature of the head and neck. CT PERFUSION IMPRESSION: 1. Negative CT perfusion for acute core infarct. 2. Increased cerebral blood flow and blood volume with decreased T-max within the left cerebral hemisphere, reflecting improved vascular flow to the left MCA distribution due to the left ICA and MCA stents. 3. Otherwise negative CT perfusion, with no other perfusion abnormality. These results were communicated to Dr. Lorraine Lax at 9:30 pmon 1/27/2021by text page via the [facility] messaging system. Electronically Signed   By: Jeannine Boga M.D.   On: 03/26/2019 22:49   CT CEREBRAL PERFUSION W CONTRAST  Result Date: 03/26/2019 CLINICAL DATA:  Follow-up examination for acute stroke, recent ICA and MCA stenting and aneurysm coiling EXAM: CT ANGIOGRAPHY HEAD AND NECK CT PERFUSION BRAIN TECHNIQUE: Multidetector CT imaging of the head and neck was performed using the standard protocol during bolus administration of intravenous contrast. Multiplanar CT image reconstructions and MIPs were obtained to evaluate the vascular anatomy. Carotid stenosis measurements (when applicable) are obtained utilizing NASCET criteria, using the distal internal carotid diameter as the denominator. Multiphase CT imaging of the brain was performed following IV bolus contrast injection. Subsequent parametric perfusion maps were calculated using RAPID software. CONTRAST:  145mL OMNIPAQUE IOHEXOL 350 MG/ML SOLN COMPARISON:  Comparison made with prior CTA from 03/19/2019. FINDINGS: CT HEAD FINDINGS Brain: Generalized age-related cerebral atrophy with chronic small vessel ischemic disease. No acute intracranial hemorrhage. No acute large vessel territory infarct. No mass lesion, midline shift or mass effect. No hydrocephalus. No extra-axial fluid collection. 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 Vascular stent has been placed within the left M1 segment. No hyperdense  vessel. Scattered vascular calcifications noted within the carotid siphons. Skull: Scalp soft tissues within normal limits.  Calvarium intact. Sinuses/Orbits: Globes and orbital soft tissues within normal limits. Scattered mucosal thickening noted within the ethmoidal air cells and right maxillary sinus. Few small air-fluid levels noted. Mastoid air cells are clear. Patient is intubated. Other: None. CTA NECK FINDINGS Aortic arch: Visualized aortic arch of normal caliber. Bovine arch with common origin of the right brachiocephalic and left common carotid artery noted. Extensive noncalcified plaque seen throughout the visualized arch and about the origin of the great vessels without hemodynamically significant stenosis. Irregular soft plaque and/or thrombus protruding into the lumen at the origin of the right brachiocephalic artery (series 12, image 38), unchanged. 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 Subclavian arteries otherwise irregular but patent without flow-limiting stenosis. Right carotid system: Right common carotid artery patent from its origin to the bifurcation without stenosis. Mild scattered plaque about the right bifurcation/proximal right ICA without hemodynamically significant stenosis. Right ICA irregular but patent to the skull base without stenosis, dissection, or occlusion. Left carotid system: Multifocal atheromatous irregularity within the left common carotid artery without stenosis. Interval placement of a vascular stent, proximal aspect at the origin of the left ICA. Mild-to-moderate stenoses involving the mid and distal aspect of the stent, measuring up to approximately 50% by NASCET criteria. Widely patent flow otherwise seen through the stent. No intraluminal thrombus or other complication. Left ICA irregular but otherwise widely patent to the skull base without stenosis, dissection, or occlusion. Vertebral arteries: Both vertebral arteries arise from  the subclavian arteries. Vertebral arteries remain widely patent within the neck without stenosis, dissection or occlusion. Skeleton: No acute osseous abnormality. No discrete osseous lesions. Moderate cervical spondylosis noted at C5-6. Patient is edentulous. Other neck: Endotracheal and enteric tubes in place. Postoperative changes from recent cutdown and open exposure of the left common carotid artery seen at the lower anterior left neck. Percutaneous drain remains in place within this region with associated scattered foci of soft tissue emphysema. Associated postoperative swelling and blood products present within this region as well. Small linear contrast blush within this region adjacent to the surgical drain likely reflects a small amount of persistent bleeding, likely venous in nature (series 5, image 36). No other active contrast extravasation. Upper chest: Layering bilateral pleural effusions with associated atelectasis partially visualized. Paraseptal emphysematous changes noted at the lung apices. Median sternotomy partially visualized. Review of the MIP images confirms the above findings CTA HEAD FINDINGS Anterior circulation: Petrous segments are widely patent. Scattered atherosclerotic change throughout the carotid siphons with associated moderate multifocal narrowing, unchanged. Left A1 widely patent. Hypoplastic right A1. Normal anterior communicating artery. Partially azygos ACA noted. 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 No visible neck remnant identified. Grossly patent flow through the adjacent stent. Interval placement of a vascular stent across the previously seen severe left M1 stenosis. Patent flow is seen through the stent. Left MCA branches perfused distally. Mild stenosis involving the proximal right M1 segment noted, stable.  Distal right MCA branches well perfused and stable from previous. Posterior circulation: Vertebral arteries remain widely patent  to the vertebrobasilar junction. Posterior inferior cerebral arteries patent bilaterally. Basilar diffusely diminutive with associated mild multifocal narrowing. Superior cerebral arteries patent bilaterally. PCA supplied via hypoplastic P1 segments as well as robust bilateral posterior communicating arteries. Prominent atherosclerotic change throughout both PCAs with associated moderate to severe multifocal stenoses, right worse than left, grossly stable. Venous sinuses: Grossly patent allowing for timing of the contrast bolus Anatomic variants: Predominant fetal type origin of the PCAs. Hypoplastic right A1 segment. Review of the MIP images confirms the above findings CT Brain Perfusion Findings: CBF (<30%) Volume: 74mL Perfusion (Tmax>6.0s) volume: 58mL Mismatch Volume: 16mL Infarction Location:Negative CT perfusion for acute ischemia. On source perfusion maps, there is increased cerebral blood volume and cerebral blood flow with mildly decreased mean transit time and T-max, likely reflecting improved cerebrovascular flow to the left cerebral hemisphere due to interval stenting of the left ICA and MCA. No other perfusion abnormality. IMPRESSION: CT HEAD IMPRESSION: 1. No acute intracranial abnormality. 2. 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 3. 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 CTA HEAD AND NECK IMPRESSION: 1. Negative CTA for emergent large vessel occlusion. 2. Interval stenting at the proximal left ICA and left M1 segment without complication. Patent flow seen through both stents. 3. 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 No residual neck remnant identified. 4. Postoperative changes from interval cutdown for left carotid artery exposure/access. Surgical drain remains in place. Small blush of contrast adjacent to the drain consistent with a small focus of active bleeding, likely venous in  nature. 5. Otherwise stable CTA with extensive atherosclerotic change elsewhere throughout the major arterial vasculature of the head and neck. CT PERFUSION IMPRESSION: 1. Negative CT perfusion for acute core infarct. 2. Increased cerebral blood flow and blood volume with decreased T-max within the left cerebral hemisphere, reflecting improved vascular flow to the left MCA distribution due to the left ICA and MCA stents. 3. Otherwise negative CT perfusion, with no other perfusion abnormality. These results were communicated to Dr. Lorraine Lax at 9:30 pmon 1/27/2021by text page via the [facility] messaging system. Electronically Signed   By: Jeannine Boga M.D.   On: 03/26/2019 22:49   DG CHEST PORT 1 VIEW  Result Date: 03/28/2019 CLINICAL DATA:  Hypertension, stroke EXAM: PORTABLE CHEST 1 VIEW COMPARISON:  Radiograph 03/26/2019 FINDINGS: Interval removal of the endotracheal and transesophageal tubes. Right upper extremity PICC tip terminates at the superior cavoatrial junction. Telemetry leads overlie the chest. Median sternotomy wires remain intact and aligned. Extensive CABG clips project over the cardiac silhouette. Slightly improved atelectasis when compared to prior. No acute osseous or soft tissue abnormality. IMPRESSION: Improving volumes. Stable cardiomegaly. Removal of the endotracheal and transesophageal tubes. Satisfactory positioning of the right upper extremity PICC. Electronically Signed   By: Lovena Le M.D.   On: 03/28/2019 05:52   DG Chest Port 1 View  Result Date: 03/26/2019 CLINICAL DATA:  Endotracheal tube placement. Hypertension. Coronary artery disease. EXAM: PORTABLE CHEST 1 VIEW COMPARISON:  03/17/2019 from [place]: Prior median sternotomy. Endotracheal tube terminates 5.5 cm above carina. Nasogastric terminates at the body of the stomach with the side port likely just at the gastroesophageal junction. Numerous leads and wires project over the chest. Cardiomegaly  accentuated by AP portable technique. The apices are partially excluded. No pleural fluid. Low lung volumes with resultant pulmonary  interstitial prominence. New subsegmental atelectasis at the left lung base. IMPRESSION: Nasogastric tube borderline low in position.  Consider advancement. Otherwise, appropriate position of support apparatus. Diminished lung volumes with new left lower lobe subsegmental atelectasis. Cardiomegaly without congestive failure. Electronically Signed   By: Abigail Miyamoto M.D.   On: 03/26/2019 16:16   ECHOCARDIOGRAM COMPLETE  Result Date: 03/27/2019   ECHOCARDIOGRAM REPORT   Patient Name:   SIGFREDO BARGAR Aprea Date of Exam: 03/27/2019 Medical Rec #:  [id number]                 Height:       77.0 in Accession #:    [id number]                Weight:       233.9 lb Date of Birth:  1951-02-22                BSA:          2.39 m Patient Age:    10 years                  BP:           113/101 mmHg Patient Gender: M                         HR:           72 bpm. Exam Location:  Inpatient Procedure: 2D Echo Indications:    Acute Respiratory Insufficiency 518.82 / R06.89  History:        Patient has no prior history of Echocardiogram examinations.                 CAD; Stroke.  Sonographer:    Vikki Ports Turrentine Referring Phys: [place]  1. Left ventricular ejection fraction, by visual estimation, is 50 to 55%. The left ventricle has low normal function. Left ventricular septal wall thickness was mildly increased. Mildly increased left ventricular posterior wall thickness. There is no left ventricular hypertrophy.  2. Left ventricular diastolic parameters are consistent with Grade I diastolic dysfunction (impaired relaxation).  3. Global right ventricle has normal systolic function.The right ventricular size is normal. No increase in right ventricular wall thickness.  4. Left atrial size was severely dilated.  5. Right atrial size was severely dilated.  6. The mitral  valve is normal in structure. Trivial mitral valve regurgitation. No evidence of mitral stenosis.  7. The tricuspid valve is normal in structure.  8. The tricuspid valve is normal in structure. Tricuspid valve regurgitation is trivial.  9. The aortic valve is tricuspid. Aortic valve regurgitation is not visualized. No evidence of aortic valve sclerosis or stenosis. 10. The pulmonic valve was normal in structure. Pulmonic valve regurgitation is trivial. 11. Normal pulmonary artery systolic pressure. 12. The inferior vena cava is normal in size with greater than 50% respiratory variability, suggesting right atrial pressure of 3 mmHg. FINDINGS  Left Ventricle: Left ventricular ejection fraction, by visual estimation, is 50 to 55%. The left ventricle has low normal function. The left ventricle is not well visualized. The left ventricular internal cavity size was the left ventricle is normal in size. Mildly increased left ventricular posterior wall thickness. There is no left ventricular hypertrophy. Left ventricular diastolic parameters are consistent with Grade I diastolic dysfunction (impaired relaxation). Normal left atrial pressure. Right Ventricle: The right ventricular size is normal. No increase in right ventricular wall thickness. Global RV  systolic function is has normal systolic function. The tricuspid regurgitant velocity is 1.91 m/s, and with an assumed right atrial pressure  of 3 mmHg, the estimated right ventricular systolic pressure is normal at 17.6 mmHg. Left Atrium: Left atrial size was severely dilated. Right Atrium: Right atrial size was severely dilated Pericardium: There is no evidence of pericardial effusion. Mitral Valve: The mitral valve is normal in structure. Trivial mitral valve regurgitation. No evidence of mitral valve stenosis by observation. Tricuspid Valve: The tricuspid valve is normal in structure. Tricuspid valve regurgitation is trivial. Aortic Valve: The aortic valve is tricuspid.  Aortic valve regurgitation is not visualized. The aortic valve is structurally normal, with no evidence of sclerosis or stenosis. Aortic valve mean gradient measures 4.0 mmHg. Aortic valve peak gradient measures 9.1 mmHg. Aortic valve area, by VTI measures 4.61 cm. Pulmonic Valve: The pulmonic valve was normal in structure. Pulmonic valve regurgitation is trivial. Pulmonic regurgitation is trivial. Aorta: The aortic root, ascending aorta and aortic arch are all structurally normal, with no evidence of dilitation or obstruction. Venous: The inferior vena cava is normal in size with greater than 50% respiratory variability, suggesting right atrial pressure of 3 mmHg. IAS/Shunts: No atrial level shunt detected by color flow Doppler. There is no evidence of a patent foramen ovale. No ventricular septal defect is seen or detected. There is no evidence of an atrial septal defect.  LEFT VENTRICLE PLAX 2D LVIDd:         4.95 cm       Diastology LVIDs:         3.75 cm       LV e' lateral:   6.74 cm/s LV PW:         1.20 cm       LV E/e' lateral: 12.4 LV IVS:        1.20 cm       LV e' medial:    8.81 cm/s LVOT diam:     2.70 cm       LV E/e' medial:  9.5 LV SV:         55 ml LV SV Index:   22.99 LVOT Area:     5.73 cm  LV Volumes (MOD) LV area d, A2C:    31.40 cm LV area d, A4C:    35.10 cm LV area s, A2C:    19.60 cm LV area s, A4C:    21.50 cm LV major d, A2C:   8.01 cm LV major d, A4C:   8.17 cm LV major s, A2C:   7.17 cm LV major s, A4C:   6.43 cm LV vol d, MOD A2C: 104.0 ml LV vol d, MOD A4C: 131.0 ml LV vol s, MOD A2C: 47.5 ml LV vol s, MOD A4C: 62.1 ml LV SV MOD A2C:     56.5 ml LV SV MOD A4C:     131.0 ml LV SV MOD BP:      60.5 ml RIGHT VENTRICLE RV S prime:     15.40 cm/s TAPSE (M-mode): 2.1 cm LEFT ATRIUM              Index       RIGHT ATRIUM           Index LA diam:        4.05 cm  1.69 cm/m  RA Area:     33.00 cm LA Vol (A2C):   92.8 ml  38.82 ml/m RA Volume:   115.00 ml 48.10  ml/m LA Vol (A4C):    109.0 ml 45.59 ml/m LA Biplane Vol: 101.0 ml 42.25 ml/m  AORTIC VALVE AV Area (Vmax):    4.36 cm AV Area (Vmean):   4.36 cm AV Area (VTI):     4.61 cm AV Vmax:           151.00 cm/s AV Vmean:          97.300 cm/s AV VTI:            0.231 m AV Peak Grad:      9.1 mmHg AV Mean Grad:      4.0 mmHg LVOT Vmax:         115.00 cm/s LVOT Vmean:        74.100 cm/s LVOT VTI:          0.186 m LVOT/AV VTI ratio: 0.81  AORTA Ao Root diam: 3.80 cm MITRAL VALVE                        TRICUSPID VALVE MV Area (PHT): 3.74 cm             TR Peak grad:   14.6 mmHg MV PHT:        58.87 msec           TR Vmax:        191.00 cm/s MV Decel Time: 203 msec MV E velocity: 83.40 cm/s 103 cm/s  SHUNTS MV A velocity: 92.20 cm/s 70.3 cm/s Systemic VTI:  0.19 m MV E/A ratio:  0.90       1.5       Systemic Diam: 2.70 cm  Skeet Latch MD Electronically signed by Skeet Latch MD Signature Date/Time: 03/27/2019/4:31:15 PM    Final    VAS US CAROTID  Result Date: 03/27/2019 Carotid Arterial Duplex Study Indications:       CVA, Syncope, Left stent and possible hematoma. Risk Factors:      Hypertension, current smoker, coronary artery disease. Limitations        Today's exam was limited due to the post surgical status of                    the patient, patient on a ventilator and movement. Comparison Study:  No prior study on file for comparison. Performing Technologist: Sharion Dove RVS  Examination Guidelines: A complete evaluation includes B-mode imaging, spectral Doppler, color Doppler, and power Doppler as needed of all accessible portions of each vessel. Bilateral testing is considered an integral part of a complete examination. Limited examinations for reoccurring indications may be performed as noted.  Left Carotid Findings: +----------+--------+--------+--------+------------------+------------------+           PSV cm/sEDV cm/sStenosisPlaque DescriptionComments            +----------+--------+--------+--------+------------------+------------------+ CCA Prox  94      19                                intimal thickening +----------+--------+--------+--------+------------------+------------------+ CCA Distal81      15                                intimal thickening +----------+--------+--------+--------+------------------+------------------+ ICA Prox  stent              +----------+--------+--------+--------+------------------+------------------+ ICA Distal52      22                                                   +----------+--------+--------+--------+------------------+------------------+ ECA       55      12                                                   +----------+--------+--------+--------+------------------+------------------+ +----------+--------+--------+--------------+-------------------+           PSV cm/sEDV cm/sDescribe      Arm Pressure (mmHG) +----------+--------+--------+--------------+-------------------+ Subclavian                Not identified                    +----------+--------+--------+--------------+-------------------+ +---------+--------+--------+--------------+ VertebralPSV cm/sEDV cm/sNot identified +---------+--------+--------+--------------+  Left Stent(s): +--------------+--+--++++ Prox to Stent 6211 +--------------+--+--++++ Proximal Stent8032 +--------------+--+--++++ Mid Stent     7227 +--------------+--+--++++    Summary:  Left Carotid: Patent stent. Small area of mixed echoes noted at incision site,               no pseudoaneurysm or large hematoma noted. 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 *See table(s) above for measurements and observations.  Electronically signed by Antony Contras MD on 03/27/2019 at 12:48:21 PM.    Final    [place] EKG SITE RITE  Result  Date: 03/27/2019 If Site Rite image not attached, placement could not be confirmed due to current cardiac rhythm.

## 2019-03-28 NOTE — Progress Notes (Signed)
Case discussed with Dr. Chase Caller, intensivist.  He said patient is not ready to be transferred out of the ICU today so we he will repage to the [facility] hospitalist team when he is ready.  He will remain under the critical care team service for now.

## 2019-03-28 NOTE — Progress Notes (Addendum)
   D/w RN  sbp 120-150 on cuff No aline Off cleviprex  Plan  - move to progressive -  - can trial off foley - paged [facility] - d/w Dr Tyrell Antonio and [facility] primary 03/29/19    Ccm off 03/29/19     SIGNATURE    Dr. Brand Males, M.D., F.C.C.P,  Pulmonary and Critical Care Medicine Staff Physician, [place] Director - Interstitial Lung Disease  Program  [facility] [place] [facility] [place], [place], [address]  Pager: [phone], If no answer or between  15:00h - 7:00h: call [phone] Telephone: [phone]  2:01 PM 03/28/2019

## 2019-03-28 NOTE — Progress Notes (Signed)
STROKE TEAM PROGRESS NOTE   INTERVAL HISTORY Pt extubated yesterday, tolerating well. This am still lethargic but following all commands, orientated to self, month, place and people but not to year. Discussed with Dr. Estanislado Pandy, heparin IV switch to Eliquis and continue brilinta but stop ASA.   Vitals:   03/28/19 0815 03/28/19 0830 03/28/19 0900 03/28/19 1200  BP:      Pulse: 74 76 72   Resp: 15 18 16    Temp:    98.6 F (37 C)  TempSrc:    Oral  SpO2: 99% 99% 99%   Weight:      Height:        CBC:  Recent Labs  Lab 03/27/19 0537 03/28/19 0633  WBC 6.3 7.1  NEUTROABS 4.8  --   HGB 8.8* 8.2*  HCT 25.8* 24.9*  MCV 93.5 94.7  PLT 173 [id number]    Basic Metabolic Panel:  Recent Labs  Lab 03/27/19 0537 03/28/19 0633  NA 137 138  K 3.8 3.2*  CL 109 110  CO2 20* 20*  GLUCOSE 121* 85  BUN 9 11  CREATININE 0.96 1.36*  CALCIUM 7.8* 7.8*  MG  --  1.7  PHOS  --  2.9   Lipid Panel:     Component Value Date/Time   CHOL 243 (H) 03/21/2019 0413   TRIG 186 (H) 03/28/2019 0633   HDL 37 (L) 03/21/2019 0413   CHOLHDL 6.6 03/21/2019 0413   VLDL 29 03/21/2019 0413   LDLCALC 177 (H) 03/21/2019 0413   HgbA1c:  Lab Results  Component Value Date   HGBA1C 6.4 (H) 03/21/2019   Urine Drug Screen: No results found for: LABOPIA, COCAINSCRNUR, LABBENZ, AMPHETMU, THCU, LABBARB  Alcohol Level No results found for: ETH  IMAGING past 48 hours CT ANGIO HEAD W OR WO CONTRAST  Result Date: 03/26/2019 CLINICAL DATA:  Follow-up examination for acute stroke, recent ICA and MCA stenting and aneurysm coiling EXAM: CT ANGIOGRAPHY HEAD AND NECK CT PERFUSION BRAIN TECHNIQUE: Multidetector CT imaging of the head and neck was performed using the standard protocol during bolus administration of intravenous contrast. Multiplanar CT image reconstructions and MIPs were obtained to evaluate the vascular anatomy. Carotid stenosis measurements (when applicable) are obtained utilizing NASCET criteria, using the  distal internal carotid diameter as the denominator. Multiphase CT imaging of the brain was performed following IV bolus contrast injection. Subsequent parametric perfusion maps were calculated using RAPID software. CONTRAST:  170mL OMNIPAQUE IOHEXOL 350 MG/ML SOLN COMPARISON:  Comparison made with prior CTA from 03/19/2019. FINDINGS: CT HEAD FINDINGS Brain: Generalized age-related cerebral atrophy with chronic small vessel ischemic disease. No acute intracranial hemorrhage. No acute large vessel territory infarct. No mass lesion, midline shift or mass effect. No hydrocephalus. No extra-axial fluid collection. 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 Vascular stent has been placed within the left M1 segment. No hyperdense vessel. Scattered vascular calcifications noted within the carotid siphons. Skull: Scalp soft tissues within normal limits.  Calvarium intact. Sinuses/Orbits: Globes and orbital soft tissues within normal limits. Scattered mucosal thickening noted within the ethmoidal air cells and right maxillary sinus. Few small air-fluid levels noted. Mastoid air cells are clear. Patient is intubated. Other: None. CTA NECK FINDINGS Aortic arch: Visualized aortic arch of normal caliber. Bovine arch with common origin of the right brachiocephalic and left common carotid artery noted. Extensive noncalcified plaque seen throughout the visualized arch and about the origin of the great vessels without hemodynamically significant stenosis. Irregular soft plaque and/or  thrombus protruding into the lumen at the origin of the right brachiocephalic artery (series 12, image 38), unchanged. 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 Subclavian arteries otherwise irregular but patent without flow-limiting stenosis. Right carotid system: Right common carotid artery patent from its origin to the bifurcation without stenosis. Mild scattered plaque about the  right bifurcation/proximal right ICA without hemodynamically significant stenosis. Right ICA irregular but patent to the skull base without stenosis, dissection, or occlusion. Left carotid system: Multifocal atheromatous irregularity within the left common carotid artery without stenosis. Interval placement of a vascular stent, proximal aspect at the origin of the left ICA. Mild-to-moderate stenoses involving the mid and distal aspect of the stent, measuring up to approximately 50% by NASCET criteria. Widely patent flow otherwise seen through the stent. No intraluminal thrombus or other complication. Left ICA irregular but otherwise widely patent to the skull base without stenosis, dissection, or occlusion. Vertebral arteries: Both vertebral arteries arise from the subclavian arteries. Vertebral arteries remain widely patent within the neck without stenosis, dissection or occlusion. Skeleton: No acute osseous abnormality. No discrete osseous lesions. Moderate cervical spondylosis noted at C5-6. Patient is edentulous. Other neck: Endotracheal and enteric tubes in place. Postoperative changes from recent cutdown and open exposure of the left common carotid artery seen at the lower anterior left neck. Percutaneous drain remains in place within this region with associated scattered foci of soft tissue emphysema. Associated postoperative swelling and blood products present within this region as well. Small linear contrast blush within this region adjacent to the surgical drain likely reflects a small amount of persistent bleeding, likely venous in nature (series 5, image 36). No other active contrast extravasation. Upper chest: Layering bilateral pleural effusions with associated atelectasis partially visualized. Paraseptal emphysematous changes noted at the lung apices. Median sternotomy partially visualized. Review of the MIP images confirms the above findings CTA HEAD FINDINGS Anterior circulation: Petrous segments  are widely patent. Scattered atherosclerotic change throughout the carotid siphons with associated moderate multifocal narrowing, unchanged. Left A1 widely patent. Hypoplastic right A1. Normal anterior communicating artery. Partially azygos ACA noted. 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 No visible neck remnant identified. Grossly patent flow through the adjacent stent. Interval placement of a vascular stent across the previously seen severe left M1 stenosis. Patent flow is seen through the stent. Left MCA branches perfused distally. Mild stenosis involving the proximal right M1 segment noted, stable. Distal right MCA branches well perfused and stable from previous. Posterior circulation: Vertebral arteries remain widely patent to the vertebrobasilar junction. Posterior inferior cerebral arteries patent bilaterally. Basilar diffusely diminutive with associated mild multifocal narrowing. Superior cerebral arteries patent bilaterally. PCA supplied via hypoplastic P1 segments as well as robust bilateral posterior communicating arteries. Prominent atherosclerotic change throughout both PCAs with associated moderate to severe multifocal stenoses, right worse than left, grossly stable. Venous sinuses: Grossly patent allowing for timing of the contrast bolus Anatomic variants: Predominant fetal type origin of the PCAs. Hypoplastic right A1 segment. Review of the MIP images confirms the above findings CT Brain Perfusion Findings: CBF (<30%) Volume: 27mL Perfusion (Tmax>6.0s) volume: 9mL Mismatch Volume: 29mL Infarction Location:Negative CT perfusion for acute ischemia. On source perfusion maps, there is increased cerebral blood volume and cerebral blood flow with mildly decreased mean transit time and T-max, likely reflecting improved cerebrovascular flow to the left cerebral hemisphere due to interval stenting of the left ICA and MCA. No other perfusion abnormality. IMPRESSION: CT HEAD  IMPRESSION: 1. No acute  intracranial abnormality. 2. 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 3. 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 CTA HEAD AND NECK IMPRESSION: 1. Negative CTA for emergent large vessel occlusion. 2. Interval stenting at the proximal left ICA and left M1 segment without complication. Patent flow seen through both stents. 3. 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 No residual neck remnant identified. 4. Postoperative changes from interval cutdown for left carotid artery exposure/access. Surgical drain remains in place. Small blush of contrast adjacent to the drain consistent with a small focus of active bleeding, likely venous in nature. 5. Otherwise stable CTA with extensive atherosclerotic change elsewhere throughout the major arterial vasculature of the head and neck. CT PERFUSION IMPRESSION: 1. Negative CT perfusion for acute core infarct. 2. Increased cerebral blood flow and blood volume with decreased T-max within the left cerebral hemisphere, reflecting improved vascular flow to the left MCA distribution due to the left ICA and MCA stents. 3. Otherwise negative CT perfusion, with no other perfusion abnormality. These results were communicated to Dr. Lorraine Lax at 9:30 pmon 1/27/2021by text page via the [facility] messaging system. Electronically Signed   By: Jeannine Boga M.D.   On: 03/26/2019 22:49   CT ANGIO NECK W OR WO CONTRAST  Result Date: 03/26/2019 CLINICAL DATA:  Follow-up examination for acute stroke, recent ICA and MCA stenting and aneurysm coiling EXAM: CT ANGIOGRAPHY HEAD AND NECK CT PERFUSION BRAIN TECHNIQUE: Multidetector CT imaging of the head and neck was performed using the standard protocol during bolus administration of intravenous contrast. Multiplanar CT image reconstructions and MIPs were obtained to evaluate the vascular anatomy. Carotid stenosis  measurements (when applicable) are obtained utilizing NASCET criteria, using the distal internal carotid diameter as the denominator. Multiphase CT imaging of the brain was performed following IV bolus contrast injection. Subsequent parametric perfusion maps were calculated using RAPID software. CONTRAST:  133mL OMNIPAQUE IOHEXOL 350 MG/ML SOLN COMPARISON:  Comparison made with prior CTA from 03/19/2019. FINDINGS: CT HEAD FINDINGS Brain: Generalized age-related cerebral atrophy with chronic small vessel ischemic disease. No acute intracranial hemorrhage. No acute large vessel territory infarct. No mass lesion, midline shift or mass effect. No hydrocephalus. No extra-axial fluid collection. 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 Vascular stent has been placed within the left M1 segment. No hyperdense vessel. Scattered vascular calcifications noted within the carotid siphons. Skull: Scalp soft tissues within normal limits.  Calvarium intact. Sinuses/Orbits: Globes and orbital soft tissues within normal limits. Scattered mucosal thickening noted within the ethmoidal air cells and right maxillary sinus. Few small air-fluid levels noted. Mastoid air cells are clear. Patient is intubated. Other: None. CTA NECK FINDINGS Aortic arch: Visualized aortic arch of normal caliber. Bovine arch with common origin of the right brachiocephalic and left common carotid artery noted. Extensive noncalcified plaque seen throughout the visualized arch and about the origin of the great vessels without hemodynamically significant stenosis. Irregular soft plaque and/or thrombus protruding into the lumen at the origin of the right brachiocephalic artery (series 12, image 38), unchanged. 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 Subclavian arteries otherwise irregular but patent without flow-limiting stenosis. Right carotid system: Right common carotid artery patent from  its origin to the bifurcation without stenosis. Mild scattered plaque about the right bifurcation/proximal right ICA without hemodynamically significant stenosis. Right ICA irregular but patent to the skull base without stenosis, dissection, or occlusion. Left carotid system: Multifocal atheromatous irregularity within  the left common carotid artery without stenosis. Interval placement of a vascular stent, proximal aspect at the origin of the left ICA. Mild-to-moderate stenoses involving the mid and distal aspect of the stent, measuring up to approximately 50% by NASCET criteria. Widely patent flow otherwise seen through the stent. No intraluminal thrombus or other complication. Left ICA irregular but otherwise widely patent to the skull base without stenosis, dissection, or occlusion. Vertebral arteries: Both vertebral arteries arise from the subclavian arteries. Vertebral arteries remain widely patent within the neck without stenosis, dissection or occlusion. Skeleton: No acute osseous abnormality. No discrete osseous lesions. Moderate cervical spondylosis noted at C5-6. Patient is edentulous. Other neck: Endotracheal and enteric tubes in place. Postoperative changes from recent cutdown and open exposure of the left common carotid artery seen at the lower anterior left neck. Percutaneous drain remains in place within this region with associated scattered foci of soft tissue emphysema. Associated postoperative swelling and blood products present within this region as well. Small linear contrast blush within this region adjacent to the surgical drain likely reflects a small amount of persistent bleeding, likely venous in nature (series 5, image 36). No other active contrast extravasation. Upper chest: Layering bilateral pleural effusions with associated atelectasis partially visualized. Paraseptal emphysematous changes noted at the lung apices. Median sternotomy partially visualized. Review of the MIP images  confirms the above findings CTA HEAD FINDINGS Anterior circulation: Petrous segments are widely patent. Scattered atherosclerotic change throughout the carotid siphons with associated moderate multifocal narrowing, unchanged. Left A1 widely patent. Hypoplastic right A1. Normal anterior communicating artery. Partially azygos ACA noted. 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 No visible neck remnant identified. Grossly patent flow through the adjacent stent. Interval placement of a vascular stent across the previously seen severe left M1 stenosis. Patent flow is seen through the stent. Left MCA branches perfused distally. Mild stenosis involving the proximal right M1 segment noted, stable. Distal right MCA branches well perfused and stable from previous. Posterior circulation: Vertebral arteries remain widely patent to the vertebrobasilar junction. Posterior inferior cerebral arteries patent bilaterally. Basilar diffusely diminutive with associated mild multifocal narrowing. Superior cerebral arteries patent bilaterally. PCA supplied via hypoplastic P1 segments as well as robust bilateral posterior communicating arteries. Prominent atherosclerotic change throughout both PCAs with associated moderate to severe multifocal stenoses, right worse than left, grossly stable. Venous sinuses: Grossly patent allowing for timing of the contrast bolus Anatomic variants: Predominant fetal type origin of the PCAs. Hypoplastic right A1 segment. Review of the MIP images confirms the above findings CT Brain Perfusion Findings: CBF (<30%) Volume: 19mL Perfusion (Tmax>6.0s) volume: 59mL Mismatch Volume: 64mL Infarction Location:Negative CT perfusion for acute ischemia. On source perfusion maps, there is increased cerebral blood volume and cerebral blood flow with mildly decreased mean transit time and T-max, likely reflecting improved cerebrovascular flow to the left cerebral hemisphere due to interval  stenting of the left ICA and MCA. No other perfusion abnormality. IMPRESSION: CT HEAD IMPRESSION: 1. No acute intracranial abnormality. 2. 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 3. 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 CTA HEAD AND NECK IMPRESSION: 1. Negative CTA for emergent large vessel occlusion. 2. Interval stenting at the proximal left ICA and left M1 segment without complication. Patent flow seen through both stents. 3. 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 No residual neck remnant identified. 4. Postoperative changes from interval cutdown for left carotid artery exposure/access. Surgical drain remains in  place. Small blush of contrast adjacent to the drain consistent with a small focus of active bleeding, likely venous in nature. 5. Otherwise stable CTA with extensive atherosclerotic change elsewhere throughout the major arterial vasculature of the head and neck. CT PERFUSION IMPRESSION: 1. Negative CT perfusion for acute core infarct. 2. Increased cerebral blood flow and blood volume with decreased T-max within the left cerebral hemisphere, reflecting improved vascular flow to the left MCA distribution due to the left ICA and MCA stents. 3. Otherwise negative CT perfusion, with no other perfusion abnormality. These results were communicated to Dr. Lorraine Lax at 9:30 pmon 1/27/2021by text page via the [facility] messaging system. Electronically Signed   By: Jeannine Boga M.D.   On: 03/26/2019 22:49   CT CEREBRAL PERFUSION W CONTRAST  Result Date: 03/26/2019 CLINICAL DATA:  Follow-up examination for acute stroke, recent ICA and MCA stenting and aneurysm coiling EXAM: CT ANGIOGRAPHY HEAD AND NECK CT PERFUSION BRAIN TECHNIQUE: Multidetector CT imaging of the head and neck was performed using the standard protocol during bolus administration of intravenous contrast. Multiplanar CT image  reconstructions and MIPs were obtained to evaluate the vascular anatomy. Carotid stenosis measurements (when applicable) are obtained utilizing NASCET criteria, using the distal internal carotid diameter as the denominator. Multiphase CT imaging of the brain was performed following IV bolus contrast injection. Subsequent parametric perfusion maps were calculated using RAPID software. CONTRAST:  116mL OMNIPAQUE IOHEXOL 350 MG/ML SOLN COMPARISON:  Comparison made with prior CTA from 03/19/2019. FINDINGS: CT HEAD FINDINGS Brain: Generalized age-related cerebral atrophy with chronic small vessel ischemic disease. No acute intracranial hemorrhage. No acute large vessel territory infarct. No mass lesion, midline shift or mass effect. No hydrocephalus. No extra-axial fluid collection. 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 Vascular stent has been placed within the left M1 segment. No hyperdense vessel. Scattered vascular calcifications noted within the carotid siphons. Skull: Scalp soft tissues within normal limits.  Calvarium intact. Sinuses/Orbits: Globes and orbital soft tissues within normal limits. Scattered mucosal thickening noted within the ethmoidal air cells and right maxillary sinus. Few small air-fluid levels noted. Mastoid air cells are clear. Patient is intubated. Other: None. CTA NECK FINDINGS Aortic arch: Visualized aortic arch of normal caliber. Bovine arch with common origin of the right brachiocephalic and left common carotid artery noted. Extensive noncalcified plaque seen throughout the visualized arch and about the origin of the great vessels without hemodynamically significant stenosis. Irregular soft plaque and/or thrombus protruding into the lumen at the origin of the right brachiocephalic artery (series 12, image 38), unchanged. 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 Subclavian arteries otherwise irregular but patent  without flow-limiting stenosis. Right carotid system: Right common carotid artery patent from its origin to the bifurcation without stenosis. Mild scattered plaque about the right bifurcation/proximal right ICA without hemodynamically significant stenosis. Right ICA irregular but patent to the skull base without stenosis, dissection, or occlusion. Left carotid system: Multifocal atheromatous irregularity within the left common carotid artery without stenosis. Interval placement of a vascular stent, proximal aspect at the origin of the left ICA. Mild-to-moderate stenoses involving the mid and distal aspect of the stent, measuring up to approximately 50% by NASCET criteria. Widely patent flow otherwise seen through the stent. No intraluminal thrombus or other complication. Left ICA irregular but otherwise widely patent to the skull base without stenosis, dissection, or occlusion. Vertebral arteries: Both vertebral arteries arise from the subclavian arteries. Vertebral arteries remain widely patent within the neck without  stenosis, dissection or occlusion. Skeleton: No acute osseous abnormality. No discrete osseous lesions. Moderate cervical spondylosis noted at C5-6. Patient is edentulous. Other neck: Endotracheal and enteric tubes in place. Postoperative changes from recent cutdown and open exposure of the left common carotid artery seen at the lower anterior left neck. Percutaneous drain remains in place within this region with associated scattered foci of soft tissue emphysema. Associated postoperative swelling and blood products present within this region as well. Small linear contrast blush within this region adjacent to the surgical drain likely reflects a small amount of persistent bleeding, likely venous in nature (series 5, image 36). No other active contrast extravasation. Upper chest: Layering bilateral pleural effusions with associated atelectasis partially visualized. Paraseptal emphysematous changes  noted at the lung apices. Median sternotomy partially visualized. Review of the MIP images confirms the above findings CTA HEAD FINDINGS Anterior circulation: Petrous segments are widely patent. Scattered atherosclerotic change throughout the carotid siphons with associated moderate multifocal narrowing, unchanged. Left A1 widely patent. Hypoplastic right A1. Normal anterior communicating artery. Partially azygos ACA noted. 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 No visible neck remnant identified. Grossly patent flow through the adjacent stent. Interval placement of a vascular stent across the previously seen severe left M1 stenosis. Patent flow is seen through the stent. Left MCA branches perfused distally. Mild stenosis involving the proximal right M1 segment noted, stable. Distal right MCA branches well perfused and stable from previous. Posterior circulation: Vertebral arteries remain widely patent to the vertebrobasilar junction. Posterior inferior cerebral arteries patent bilaterally. Basilar diffusely diminutive with associated mild multifocal narrowing. Superior cerebral arteries patent bilaterally. PCA supplied via hypoplastic P1 segments as well as robust bilateral posterior communicating arteries. Prominent atherosclerotic change throughout both PCAs with associated moderate to severe multifocal stenoses, right worse than left, grossly stable. Venous sinuses: Grossly patent allowing for timing of the contrast bolus Anatomic variants: Predominant fetal type origin of the PCAs. Hypoplastic right A1 segment. Review of the MIP images confirms the above findings CT Brain Perfusion Findings: CBF (<30%) Volume: 15mL Perfusion (Tmax>6.0s) volume: 61mL Mismatch Volume: 68mL Infarction Location:Negative CT perfusion for acute ischemia. On source perfusion maps, there is increased cerebral blood volume and cerebral blood flow with mildly decreased mean transit time and T-max, likely  reflecting improved cerebrovascular flow to the left cerebral hemisphere due to interval stenting of the left ICA and MCA. No other perfusion abnormality. IMPRESSION: CT HEAD IMPRESSION: 1. No acute intracranial abnormality. 2. 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 3. 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 CTA HEAD AND NECK IMPRESSION: 1. Negative CTA for emergent large vessel occlusion. 2. Interval stenting at the proximal left ICA and left M1 segment without complication. Patent flow seen through both stents. 3. 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 No residual neck remnant identified. 4. Postoperative changes from interval cutdown for left carotid artery exposure/access. Surgical drain remains in place. Small blush of contrast adjacent to the drain consistent with a small focus of active bleeding, likely venous in nature. 5. Otherwise stable CTA with extensive atherosclerotic change elsewhere throughout the major arterial vasculature of the head and neck. CT PERFUSION IMPRESSION: 1. Negative CT perfusion for acute core infarct. 2. Increased cerebral blood flow and blood volume with decreased T-max within the left cerebral hemisphere, reflecting improved vascular flow to the left MCA distribution due to the left ICA and MCA stents. 3. Otherwise negative CT perfusion, with  no other perfusion abnormality. These results were communicated to Dr. Lorraine Lax at 9:30 pmon 1/27/2021by text page via the [facility] messaging system. Electronically Signed   By: Jeannine Boga M.D.   On: 03/26/2019 22:49   DG CHEST PORT 1 VIEW  Result Date: 03/28/2019 CLINICAL DATA:  Hypertension, stroke EXAM: PORTABLE CHEST 1 VIEW COMPARISON:  Radiograph 03/26/2019 FINDINGS: Interval removal of the endotracheal and transesophageal tubes. Right upper extremity PICC tip terminates at the superior cavoatrial junction. Telemetry  leads overlie the chest. Median sternotomy wires remain intact and aligned. Extensive CABG clips project over the cardiac silhouette. Slightly improved atelectasis when compared to prior. No acute osseous or soft tissue abnormality. IMPRESSION: Improving volumes. Stable cardiomegaly. Removal of the endotracheal and transesophageal tubes. Satisfactory positioning of the right upper extremity PICC. Electronically Signed   By: Lovena Le M.D.   On: 03/28/2019 05:52   DG Chest Port 1 View  Result Date: 03/26/2019 CLINICAL DATA:  Endotracheal tube placement. Hypertension. Coronary artery disease. EXAM: PORTABLE CHEST 1 VIEW COMPARISON:  03/17/2019 from [place]: Prior median sternotomy. Endotracheal tube terminates 5.5 cm above carina. Nasogastric terminates at the body of the stomach with the side port likely just at the gastroesophageal junction. Numerous leads and wires project over the chest. Cardiomegaly accentuated by AP portable technique. The apices are partially excluded. No pleural fluid. Low lung volumes with resultant pulmonary interstitial prominence. New subsegmental atelectasis at the left lung base. IMPRESSION: Nasogastric tube borderline low in position.  Consider advancement. Otherwise, appropriate position of support apparatus. Diminished lung volumes with new left lower lobe subsegmental atelectasis. Cardiomegaly without congestive failure. Electronically Signed   By: Abigail Miyamoto M.D.   On: 03/26/2019 16:16   ECHOCARDIOGRAM COMPLETE  Result Date: 03/27/2019   ECHOCARDIOGRAM REPORT   Patient Name:   ROYER MAISANO Alexie Date of Exam: 03/27/2019 Medical Rec #:  [id number]                 Height:       77.0 in Accession #:    [id number]                Weight:       233.9 lb Date of Birth:  1951/01/06                BSA:          2.39 m Patient Age:    71 years                  BP:           113/101 mmHg Patient Gender: M                         HR:           72 bpm. Exam  Location:  Inpatient Procedure: 2D Echo Indications:    Acute Respiratory Insufficiency 518.82 / R06.89  History:        Patient has no prior history of Echocardiogram examinations.                 CAD; Stroke.  Sonographer:    Vikki Ports T[place] Referring Phys: [place]  1. Left ventricular ejection fraction, by visual estimation, is 50 to 55%. The left ventricle has low normal function. Left ventricular septal wall thickness was mildly increased. Mildly increased left ventricular posterior wall thickness. There is no left ventricular hypertrophy.  2. Left ventricular diastolic parameters  are consistent with Grade I diastolic dysfunction (impaired relaxation).  3. Global right ventricle has normal systolic function.The right ventricular size is normal. No increase in right ventricular wall thickness.  4. Left atrial size was severely dilated.  5. Right atrial size was severely dilated.  6. The mitral valve is normal in structure. Trivial mitral valve regurgitation. No evidence of mitral stenosis.  7. The tricuspid valve is normal in structure.  8. The tricuspid valve is normal in structure. Tricuspid valve regurgitation is trivial.  9. The aortic valve is tricuspid. Aortic valve regurgitation is not visualized. No evidence of aortic valve sclerosis or stenosis. 10. The pulmonic valve was normal in structure. Pulmonic valve regurgitation is trivial. 11. Normal pulmonary artery systolic pressure. 12. The inferior vena cava is normal in size with greater than 50% respiratory variability, suggesting right atrial pressure of 3 mmHg. FINDINGS  Left Ventricle: Left ventricular ejection fraction, by visual estimation, is 50 to 55%. The left ventricle has low normal function. The left ventricle is not well visualized. The left ventricular internal cavity size was the left ventricle is normal in size. Mildly increased left ventricular posterior wall thickness. There is no left ventricular  hypertrophy. Left ventricular diastolic parameters are consistent with Grade I diastolic dysfunction (impaired relaxation). Normal left atrial pressure. Right Ventricle: The right ventricular size is normal. No increase in right ventricular wall thickness. Global RV systolic function is has normal systolic function. The tricuspid regurgitant velocity is 1.91 m/s, and with an assumed right atrial pressure  of 3 mmHg, the estimated right ventricular systolic pressure is normal at 17.6 mmHg. Left Atrium: Left atrial size was severely dilated. Right Atrium: Right atrial size was severely dilated Pericardium: There is no evidence of pericardial effusion. Mitral Valve: The mitral valve is normal in structure. Trivial mitral valve regurgitation. No evidence of mitral valve stenosis by observation. Tricuspid Valve: The tricuspid valve is normal in structure. Tricuspid valve regurgitation is trivial. Aortic Valve: The aortic valve is tricuspid. Aortic valve regurgitation is not visualized. The aortic valve is structurally normal, with no evidence of sclerosis or stenosis. Aortic valve mean gradient measures 4.0 mmHg. Aortic valve peak gradient measures 9.1 mmHg. Aortic valve area, by VTI measures 4.61 cm. Pulmonic Valve: The pulmonic valve was normal in structure. Pulmonic valve regurgitation is trivial. Pulmonic regurgitation is trivial. Aorta: The aortic root, ascending aorta and aortic arch are all structurally normal, with no evidence of dilitation or obstruction. Venous: The inferior vena cava is normal in size with greater than 50% respiratory variability, suggesting right atrial pressure of 3 mmHg. IAS/Shunts: No atrial level shunt detected by color flow Doppler. There is no evidence of a patent foramen ovale. No ventricular septal defect is seen or detected. There is no evidence of an atrial septal defect.  LEFT VENTRICLE PLAX 2D LVIDd:         4.95 cm       Diastology LVIDs:         3.75 cm       LV e' lateral:    6.74 cm/s LV PW:         1.20 cm       LV E/e' lateral: 12.4 LV IVS:        1.20 cm       LV e' medial:    8.81 cm/s LVOT diam:     2.70 cm       LV E/e' medial:  9.5 LV SV:  55 ml LV SV Index:   22.99 LVOT Area:     5.73 cm  LV Volumes (MOD) LV area d, A2C:    31.40 cm LV area d, A4C:    35.10 cm LV area s, A2C:    19.60 cm LV area s, A4C:    21.50 cm LV major d, A2C:   8.01 cm LV major d, A4C:   8.17 cm LV major s, A2C:   7.17 cm LV major s, A4C:   6.43 cm LV vol d, MOD A2C: 104.0 ml LV vol d, MOD A4C: 131.0 ml LV vol s, MOD A2C: 47.5 ml LV vol s, MOD A4C: 62.1 ml LV SV MOD A2C:     56.5 ml LV SV MOD A4C:     131.0 ml LV SV MOD BP:      60.5 ml RIGHT VENTRICLE RV S prime:     15.40 cm/s TAPSE (M-mode): 2.1 cm LEFT ATRIUM              Index       RIGHT ATRIUM           Index LA diam:        4.05 cm  1.69 cm/m  RA Area:     33.00 cm LA Vol (A2C):   92.8 ml  38.82 ml/m RA Volume:   115.00 ml 48.10 ml/m LA Vol (A4C):   109.0 ml 45.59 ml/m LA Biplane Vol: 101.0 ml 42.25 ml/m  AORTIC VALVE AV Area (Vmax):    4.36 cm AV Area (Vmean):   4.36 cm AV Area (VTI):     4.61 cm AV Vmax:           151.00 cm/s AV Vmean:          97.300 cm/s AV VTI:            0.231 m AV Peak Grad:      9.1 mmHg AV Mean Grad:      4.0 mmHg LVOT Vmax:         115.00 cm/s LVOT Vmean:        74.100 cm/s LVOT VTI:          0.186 m LVOT/AV VTI ratio: 0.81  AORTA Ao Root diam: 3.80 cm MITRAL VALVE                        TRICUSPID VALVE MV Area (PHT): 3.74 cm             TR Peak grad:   14.6 mmHg MV PHT:        58.87 msec           TR Vmax:        191.00 cm/s MV Decel Time: 203 msec MV E velocity: 83.40 cm/s 103 cm/s  SHUNTS MV A velocity: 92.20 cm/s 70.3 cm/s Systemic VTI:  0.19 m MV E/A ratio:  0.90       1.5       Systemic Diam: 2.70 cm  Skeet Latch MD Electronically signed by Skeet Latch MD Signature Date/Time: 03/27/2019/4:31:15 PM    Final    VAS US CAROTID  Result Date: 03/27/2019 Carotid Arterial Duplex Study  Indications:       CVA, Syncope, Left stent and possible hematoma. Risk Factors:      Hypertension, current smoker, coronary artery disease. Limitations        Today's exam was limited due to the post surgical status of  the patient, patient on a ventilator and movement. Comparison Study:  No prior study on file for comparison. Performing Technologist: Sharion Dove RVS  Examination Guidelines: A complete evaluation includes B-mode imaging, spectral Doppler, color Doppler, and power Doppler as needed of all accessible portions of each vessel. Bilateral testing is considered an integral part of a complete examination. Limited examinations for reoccurring indications may be performed as noted.  Left Carotid Findings: +----------+--------+--------+--------+------------------+------------------+           PSV cm/sEDV cm/sStenosisPlaque DescriptionComments           +----------+--------+--------+--------+------------------+------------------+ CCA Prox  94      19                                intimal thickening +----------+--------+--------+--------+------------------+------------------+ CCA Distal81      15                                intimal thickening +----------+--------+--------+--------+------------------+------------------+ ICA Prox                                            stent              +----------+--------+--------+--------+------------------+------------------+ ICA Distal52      22                                                   +----------+--------+--------+--------+------------------+------------------+ ECA       55      12                                                   +----------+--------+--------+--------+------------------+------------------+ +----------+--------+--------+--------------+-------------------+           PSV cm/sEDV cm/sDescribe      Arm Pressure (mmHG)  +----------+--------+--------+--------------+-------------------+ Subclavian                Not identified                    +----------+--------+--------+--------------+-------------------+ +---------+--------+--------+--------------+ VertebralPSV cm/sEDV cm/sNot identified +---------+--------+--------+--------------+  Left Stent(s): +--------------+--+--++++ Prox to Stent 6211 +--------------+--+--++++ Proximal [id number] +--------------+--+--++++ Mid Stent     7227 +--------------+--+--++++    Summary:  Left Carotid: Patent stent. Small area of mixed echoes noted at incision site,               no pseudoaneurysm or large hematoma noted. 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 *See table(s) above for measurements and observations.  Electronically signed by Antony Contras MD on 03/27/2019 at 12:48:21 PM.    Final    [place] EKG SITE RITE  Result Date: 03/27/2019 If Site Rite image not attached, placement could not be confirmed due to current cardiac rhythm.   PHYSICAL EXAM    Temp:  [98.6 F (37 C)-99.8 F (37.7 C)] 98.6 F (37 C) (01/29 1200) Pulse Rate:  [39-82] 72 (01/29 0900) Resp:  [11-23] 16 (01/29 0900) BP: (122)/(54) 122/54 (01/28 1400)  SpO2:  [59 %-100 %] 99 % (01/29 0900) Arterial Line BP: (122-146)/(59-77) 146/71 (01/29 0900)  General - Well nourished, well developed, lethargic  Ophthalmologic - fundi not visualized due to noncooperation.  Cardiovascular - Regular rate and rhythm.  Neuro - lethargic, but eyes open on voice, following all simple commands. Orientated to place, age, self, month and people but not to year. Visual field full, PERRL EOMI. Facial symmetrical.  Tongue midline.  Bilateral upper extremity 3+/5,bilateral lower extremity 3/5 proximal and 4/5 distally. DTR 1+ and no babinski. Sensation symmetrical, coordination slow but intact. Gait not  tested.   ASSESSMENT/PLAN Mr. Zaion Weers Rosenstock is a 69 y.o. 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  He subsequently became diaphoretic, hypotensive and unresponsive and was taken to [facility] where an MRI revealed a stroke. transferred to [facility]. Found to have a 90% L M1 stenosis, large L ACA aneurysm and atrial appendage clot. Underwent L CEA followed by L M1 stent angioplasty and stent assisted coiling L ACA aneurysm. Found to have R hemiparesis following procedure.  Left M1 high grade stenosis  CTA head and neck moderate to severe stenosis left M1 with poststenotic dilatation  IR severe left M1 90% stenosis  On Plavix 1/27, P2Y12 = 255  S/p left M1 stenting 1/27 Deveshwar  Post IR neuro changes - R HP, resolved  CT stable, CTA showed patent stent  Now on brilinta, ASA discontinued - P2Y12 = 21  Left ACA pericallosal segment aneurysm  CTA head and neck 4x7  IR 9.7x7  S/p stent assisted coiling for aneurysm repair 1/27 Deveshwar  CTA head and neck - 1/28 - stable coiling and stenting  Now on brilinta, ASA discontinued  Left ICA proximal stenosis  CT head and neck left ICA proximal 50% stenosis with soft plaque versus mural thrombus  IR 75% left ICA proximal stenosis  Was on Plavix and heparin IV  L CCA sheath placement in OR by VVS due to poor femoral access then to IR frontal operculum stent  S/p L ICA stent 1/27 Deveshwar, then back to OR for sheath removal and CCA closure by VVS -> JP removed today  Post IR neuro changes - R HP, resolved  CT stable and CTA neck patent stent  On brilinta, ASA discontinued  Stroke:  early/ subacute infarct with in the right cerebellar peduncle, likely small vessel disease.   CT head - OSH -  showed 8X47mm focal slightly dense lesion within the left and anterior callosal region   MRI head - OSH - anterior cerebral artery pericallosal aneurysm. Showed an early/ subacute infarct with in the right cerebellum in the right brachium pontis.  CTA H&N - OSH - 4 x 7 mm aneurysm left pericallosal segment left ACA without evidence of rupture. Moderate to severe stenosis left M1 segment with poststenotic dilatation. Atherosclerotic disease/mural thrombus in the proximal left ICA 50% stenosis.  IR - severe 90% left MCA M1 stenosis.  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  75% left ICA proximal stenosis.  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   LDL - 177  HgbA1c - 6.4  VTE prophylaxis - SCDs and Heparin IV  aspirin 81 mg daily prior to admission, was on aspirin 81, brilinta 90 bid and heparin IV -  discussed with Dr. Estanislado Pandy, will change heparin IV to eliquis and stop ASA 81, continue brilinta  Therapy recommendations:  CIR  Disposition:  Pending  LAA thrombus  TEE - 03/20/2019 OSH - Left atrial appendage thrombus  Tele monitoring to evaluate for afib  Cardiology on board  on heparin IV -> change to eliquis  Recommend long term cardiac monitoring with cardiology to evaluate for afib once off anticoagulation. Follow up with cardiology.   PAD  Pre and post IR procedure, but her lower extremity PAD found with Doppler  Vascular surgery consulted  Dr. Oneida Alar to follow as outpatient  No IR procedure from femoral approach  Hypertension  Home BP meds: none listed  Stable  BP goal 120-140 x 24h following IR procedure.   Long-term BP goal  normotensive  Hyperlipidemia  Home Lipid lowering medication: Lovaza  LDL 177, goal < 70  Current lipid lowering medication: Lipitor 80 mg daily   Continue statin at discharge  Diabetes  Home diabetic meds: none   Current diabetic meds: SSI   HgbA1c 6.4, goal < 7.0  CBG  monitoring  Close PCP follow-up  Tobacco abuse  Current smoker  Smoking cessation counseling provided  Pt is willing to quit  Other Stroke Risk Factors  Previous ETOH use  Family hx stroke (mother)  Coronary artery disease  Other Active Problems  Hypokalemia, resolved  Anemia 8.5-8.2-8.8-8.2  Hospital day # 8  This patient is critically ill due to stroke, large aneurysm, ICA stenosis, intracranial stenosis, status post IR procedure, PAD and at significant risk of neurological worsening, death form recurrent stroke, hemorrhagic conversion, aneurysm rupture, seizure, limb ischemia. This patient's care requires constant monitoring of vital signs, hemodynamics, respiratory and cardiac monitoring, review of multiple databases, neurological assessment, discussion with family, other specialists and medical decision making of high complexity. I spent 35 minutes of neurocritical care time in the care of this patient.  I discussed with Dr. Chase Caller.   Rosalin Hawking, MD PhD Stroke Neurology 03/28/2019 1:27 PM   To contact Stroke Continuity provider, please refer to [email or web address] After hours, contact General Neurology

## 2019-03-28 NOTE — Progress Notes (Signed)
Inpatient Rehabilitation Admissions Coordinator  Inpatient rehab consult received. I will follow up on Monday to assist with planning dispo options.  Danne Baxter, RN, MSN Rehab Admissions Coordinator [phone] 03/28/2019 3:20 PM

## 2019-03-28 NOTE — Progress Notes (Signed)
Pt transferred to [facility] room 10, report given to [employer]. Pt's wife aware of pt's new room number.

## 2019-03-28 NOTE — Evaluation (Addendum)
Clinical/Bedside Swallow Evaluation Patient Details  Name: Jeremy Sherman MRN: [id number] Date of Birth: 1951/01/29  Today's Date: 03/28/2019 Time: SLP Start Time (ACUTE ONLY): 0900 SLP Stop Time (ACUTE ONLY): 0927 SLP Time Calculation (min) (ACUTE ONLY): 27 min  Past Medical History:  Past Medical History:  Diagnosis Date  . CAD (coronary artery disease)   . Carotid stenosis, left    L ICA 50%  . Hyperlipidemia   . Hypertension   . Hypertensive urgency 03/11/2019  . 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 Prediabetes    Past Surgical History:  Past Surgical History:  Procedure Laterality Date  . CORONARY ARTERY BYPASS GRAFT  07/02/2007   [place]-> LAD SVG-> PD branch of RCA [name]) at [place] Left 03/26/2019   Procedure: EXPOSURE OF LEFT COMMON CAROTID ARTERY AND PLACEMENT OF SHEATH;  Surgeon: Marty Heck, MD;  Location: [place];  Service: Vascular;  Laterality: Left;  . ENDARTERECTOMY Left 03/26/2019   Procedure: REMOVAL OF CAROTID SHEATH AND CLOSURE OF  CAROTID ARTERY;  Surgeon: Serafina Mitchell, MD;  Location: [facility] OR;  Service: Vascular;  Laterality: Left;  . IR ANGIO INTRA EXTRACRAN SEL COM CAROTID INNOMINATE BILAT MOD SED  03/21/2019  . IR ANGIO VERTEBRAL SEL SUBCLAVIAN INNOMINATE BILAT MOD SED  03/21/2019   HPI:  Jeremy Sherman  is a 69 y.o. male, w hypothyroidism, hypertension, hyperlipidemia, prediabetes, CAD s/p CABG 07/02/2007, smoker, was recently admitted at [facility] 03/11/19-03/13/19, for dizziness thought related to hypertensive urgency, apparently presented to [facility] ER 03/17/19 for dizziness and syncope at this pcp office, pt was unresponsive , hypotensive and diaphoretic.  Imaging revealed subacute infarct within the right cerebellum and right brachium pontis and punctate acute left parietal lobe cortical infarct.   Assessment / Plan / Recommendation Clinical Impression  Pt drowsy but  cooperative t/o session. Pt needed Max HOH assist bringing spoon/cup to mouth. Pt presented with delayed AP transit and multiple swallows following puree and soft solid trials; but presented specifically with significant reduced mastication and clearance from oral cavity following soft solids (egg). Pt required x4 liquid wash/ puree bite and Mod cueing to clear soft solids out of oral cavity. No significant s/sx of aspiration following thin liquid trails. x1 delayed cough was noted as SLP left room, but considering the difficulty the pt had with soft solid and possible residue left in the oral cavity/ pharynx, this could be related. Due to pt's current cognitive status and decreased level of alertness for session, puree diet with thin liquids are recommended. SLP will continue to follow to upgrade diet as appropriate and as pt's cognitive status improves.   SLP Visit Diagnosis: Dysphagia, oropharyngeal phase (R13.12)    Aspiration Risk  Moderate aspiration risk;Mild aspiration risk    Diet Recommendation Dysphagia 1 (Puree);Thin liquid   Liquid Administration via: Straw Medication Administration: Crushed with puree Supervision: Full supervision/cueing for compensatory strategies Compensations: Slow rate;Small sips/bites;Minimize environmental distractions Postural Changes: Seated upright at 90 degrees;Remain upright for at least 30 minutes after po intake    Other  Recommendations Oral Care Recommendations: Oral care BID   Follow up Recommendations Inpatient Rehab      Frequency and Duration min 2x/week  2 weeks       Prognosis Prognosis for Safe Diet Advancement: Good Barriers to Reach Goals: Cognitive deficits      Swallow Study   General HPI: Jeremy Sherman  is a 69 y.o. male, w  hypothyroidism, hypertension, hyperlipidemia, prediabetes, CAD s/p CABG 07/02/2007, smoker, was recently admitted at [facility] 03/11/19-03/13/19, for dizziness thought related to hypertensive urgency,  apparently presented to [facility] ER 03/17/19 for dizziness and syncope at this pcp office, pt was unresponsive , hypotensive and diaphoretic.  Imaging revealed subacute infarct within the right cerebellum and right brachium pontis and punctate acute left parietal lobe cortical infarct. Type of Study: Bedside Swallow Evaluation Previous Swallow Assessment: none Diet Prior to this Study: Dysphagia 3 (soft);Thin liquids Temperature Spikes Noted: No Respiratory Status: Nasal cannula History of Recent Intubation: Yes Length of Intubations (days): 1 days Date extubated: 03/27/19 Behavior/Cognition: Cooperative;Lethargic/Drowsy;Requires cueing Oral Cavity Assessment: Within Functional Limits Oral Care Completed by SLP: No Oral Cavity - Dentition: Edentulous Vision: Functional for self-feeding Self-Feeding Abilities: Needs assist Patient Positioning: Upright in bed Baseline Vocal Quality: Other (comment)(mildly rough, pt reports to be baseline) Volitional Swallow: Able to elicit    Oral/Motor/Sensory Function Overall Oral Motor/Sensory Function: Within functional limits   Ice Chips Ice chips: Not tested   Thin Liquid Thin Liquid: Within functional limits Presentation: Straw    Nectar Thick Nectar Thick Liquid: Not tested   Honey Thick Honey Thick Liquid: Not tested   Puree Puree: Impaired Presentation: Spoon Oral Phase Impairments: Impaired mastication Oral Phase Functional Implications: Prolonged oral transit Pharyngeal Phase Impairments: Multiple swallows   Solid     Solid: Impaired Presentation: Spoon Oral Phase Impairments: Impaired mastication Oral Phase Functional Implications: Prolonged oral transit;Impaired mastication;Oral holding Pharyngeal Phase Impairments: Wet Vocal Quality;Cough - Delayed     Aline August, Student SLP Office: [phone]  03/28/2019,10:07 AM

## 2019-03-28 NOTE — Consult Note (Addendum)
Physical Medicine and Rehabilitation Consult Reason for Consult: Decreased functional mobility Referring Physician: [facility]   HPI: Davion Hoshino is a 69 y.o. 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  History taken from chart review and patient, with some assistance, nursing.  Patient lives with spouse and daughter.  1 level home 4 steps to entry.  Works full-time as a Glass blower/designer.  By report patient had episode of dizziness confusion on 03/17/2019.  He went to his PCPs office became unresponsive hypotensive and diaphoretic.  Blood pressure reportedly lower than 100 he was brought by EMS to [facility].  Patient had recently been hospitalized at [facility] 03/11/2019 to 03/13/2019 for hypertensive urgency.  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  TEE at outside hospital showed left atrial appendage thrombus and was placed on IV heparin.  He was transferred to [facility] for further evaluation.  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  Found to have right hemiparesis following procedure.  Follow-up MRI pending. Patient did remain intubated until 03/27/2019.  Patient currently remains on heparin GGT as advised.  He has been receiving Cleviprex for blood pressure control.  He is on a dysphagia 1 diet.  Therapy evaluations completed and recommendations of physical medicine rehab consult.  Review of Systems  Constitutional: Positive for malaise/fatigue. Negative for chills and fever.  HENT: Negative for hearing loss.   Eyes: Negative for blurred vision and double vision.  Respiratory: Negative for cough and shortness of breath.   Cardiovascular: Positive for  leg swelling. Negative for chest pain and palpitations.  Gastrointestinal: Positive for constipation. Negative for heartburn, nausea and vomiting.  Genitourinary: Positive for urgency. Negative for dysuria, flank pain and hematuria.  Musculoskeletal: Positive for joint pain and myalgias.  Skin: Negative for rash.  Neurological: Positive for focal weakness and weakness.   Past Medical History:  Diagnosis Date  . CAD (coronary artery disease)   . Carotid stenosis, left    L ICA 50%  . Hyperlipidemia   . Hypertension   . Hypertensive urgency 03/11/2019  . 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 Prediabetes    Past Surgical History:  Procedure Laterality Date  . CORONARY ARTERY BYPASS GRAFT  07/02/2007   [place]-> LAD SVG-> PD branch of RCA [name]) at [place] Left 03/26/2019   Procedure: EXPOSURE OF LEFT COMMON CAROTID ARTERY AND PLACEMENT OF SHEATH;  Surgeon: Marty Heck, MD;  Location: [facility];  Service: Vascular;  Laterality: Left;  . ENDARTERECTOMY Left 03/26/2019   Procedure: REMOVAL OF CAROTID SHEATH AND CLOSURE OF  CAROTID ARTERY;  Surgeon: Serafina Mitchell, MD;  Location: [facility] OR;  Service: Vascular;  Laterality: Left;  . IR ANGIO INTRA EXTRACRAN SEL COM CAROTID INNOMINATE BILAT MOD SED  03/21/2019  . IR ANGIO VERTEBRAL SEL SUBCLAVIAN INNOMINATE BILAT MOD SED  03/21/2019   Family History  Problem Relation Age of Onset  . Stroke Mother    Social History:  reports that he has been smoking cigarettes. He started smoking about 45 years ago. He has a 22.50 pack-year smoking history. He has never used smokeless tobacco. He reports previous alcohol use.  No history on file for drug. Allergies: No Known Allergies Medications Prior to Admission  Medication Sig Dispense Refill  . aspirin EC 81 MG tablet Take 81 mg by mouth daily.    Marland Kitchen omega-3 acid ethyl esters (LOVAZA) 1 g capsule Take 1 g by mouth daily.      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 Cueing and assist needed at BLEs and assist for trunk elevation Transfers Overall transfer level: Needs assistance Equipment used: Rolling walker (2 wheeled) Transfers: Sit to/from Stand Sit to Stand: Min assist Stand pivot transfers: Min assist General transfer comment: not tested this date due to MD request to hold while carotid drain in place Ambulation/Gait Ambulation/Gait assistance: Min assist Gait Distance (Feet): 60 Feet Assistive device: Rolling walker (2 wheeled) Gait Pattern/deviations: Wide base of support, Step-through pattern, Decreased stride length, Trunk flexed General Gait Details: Min assist for steadying, verbal cuing for upright posture and use of RW to offweight LEs as needed for weakness. Pt with mild L knee buckling noted this session, corrected by pt. Pt with standing rest breaks x2, lasting ~30 seconds each. Gait velocity: decr Gait velocity interpretation: >2.62 ft/sec, indicative of community ambulatory Stairs: Yes Stairs assistance: Min assist Stair  Management: One rail Left, Forwards, Alternating pattern Number of Stairs: 5 General stair comments: safe with use of rails    ADL: ADL Overall ADL's : Needs assistance/impaired Eating/Feeding: NPO Grooming: Minimal assistance, Sitting Upper Body Bathing: Moderate assistance, Sitting Lower Body Bathing: Maximal assistance, Sit to/from stand Upper Body Dressing : Moderate assistance, Sitting Lower Body Dressing: Maximal assistance, Sit to/from stand Toilet Transfer: Minimal assistance, Ambulation, Comfort height toilet, Grab bars Toileting- Clothing Manipulation and Hygiene: Minimal assistance, Sit to/from stand Toileting - Clothing Manipulation Details (indicate cue type and reason): unaware of incontinence during session Tub/ Shower Transfer: Min guard, 3 in 1, Ambulation Tub/Shower Transfer Details (indicate cue type and reason): education given on use of 3:1 in tub shower Functional mobility during ADLs: Minimal assistance General ADL Comments: transfers not attempted 2/2 carotid drain in place (MD request) pt with significant status change from original eval  Cognition: Cognition Overall Cognitive Status: Impaired/Different from baseline Arousal/Alertness: Awake/alert Orientation Level: Oriented X4 Attention: Focused, Sustained Focused Attention: Appears intact Sustained Attention: Appears intact Memory: Impaired Memory Impairment: Decreased short term memory Decreased Short Term Memory: Verbal basic Problem Solving: Impaired Problem Solving Impairment: Verbal complex Executive Function: Reasoning Reasoning: Impaired Reasoning Impairment: Verbal complex Cognition Arousal/Alertness: Suspect due to medications, Lethargic Behavior During Therapy: Flat affect Overall Cognitive Status: Impaired/Different from baseline Area of Impairment: Safety/judgement, Problem solving, Awareness, Following commands Following Commands: Follows one step commands consistently, Follows one  step commands with increased time Safety/Judgement: Decreased awareness of safety Awareness: Intellectual Problem Solving: Difficulty sequencing,  Requires verbal cues, Slow processing, Requires tactile cues General Comments: pt presenting with status change from initial eval, much more lethargic this date- stating "there ya go" to many statements when not always appropriate. Decreased overall awareness of deficits  Blood pressure (!) 122/54, pulse 72, temperature 99.1 F (37.3 C), temperature source Axillary, resp. rate 16, height 6\' 5"  (1.956 m), weight 106.1 kg, SpO2 99 %. Physical Exam  Vitals reviewed. Constitutional: He appears well-developed.  Obese  HENT:  Head: Normocephalic and atraumatic.  Eyes: Right eye exhibits no discharge. Left eye exhibits no discharge.  ?  Disconjugate gaze-trouble keeping eyes open.  Neck: No tracheal deviation present. No thyromegaly present.  Respiratory: Effort normal. No stridor. No respiratory distress.  + [place]  GI: Soft. He exhibits distension.  Genitourinary:    Genitourinary Comments: Foley with sanguinous drainage   Musculoskeletal:     Comments: Generalized edema  Neurological: He is alert.  He does follow simple commands.   Provides his name and age. Motor: RUE: Shoulder abduction 2-/5, elbow flexion/extension 2+/5, handgrip 3/5 Right lower extremity: Hip flexion, knee extension 2+/5, ankle dorsiflexion, 5/5 in Left upper extremity: 3/5 proximal distal Left lower extremity: 3/5 proximal distal  Skin:  Left neck incision C/D/I Vascular changes bilateral lower extremities  Psychiatric: His affect is blunt. His speech is delayed. He is slowed.    Results for orders placed or performed during the hospital encounter of 03/20/19 (from the past 24 hour(s))  Glucose, capillary     Status: None   Collection Time: 03/27/19 11:26 AM  Result Value Ref Range   Glucose-Capillary 78 70 - 99 mg/dL   Comment 1 Notify RN    Comment 2 Document in  Chart   Heparin level (unfractionated)     Status: Abnormal   Collection Time: 03/27/19  1:38 PM  Result Value Ref Range   Heparin Unfractionated 0.24 (L) 0.30 - 0.70 IU/mL  Platelet inhibition p2y12 (Not at [facility])     Status: Abnormal   Collection Time: 03/27/19  2:11 PM  Result Value Ref Range   Platelet Function  P2Y12 21 (L) 182 - 335 PRU  Glucose, capillary     Status: None   Collection Time: 03/27/19  4:17 PM  Result Value Ref Range   Glucose-Capillary 79 70 - 99 mg/dL  Glucose, capillary     Status: None   Collection Time: 03/27/19  7:34 PM  Result Value Ref Range   Glucose-Capillary 90 70 - 99 mg/dL  Heparin level (unfractionated)     Status: None   Collection Time: 03/27/19 10:01 PM  Result Value Ref Range   Heparin Unfractionated 0.31 0.30 - 0.70 IU/mL  Glucose, capillary     Status: None   Collection Time: 03/27/19 11:15 PM  Result Value Ref Range   Glucose-Capillary 82 70 - 99 mg/dL  CBC     Status: Abnormal   Collection Time: 03/28/19  6:33 AM  Result Value Ref Range   WBC 7.1 4.0 - 10.5 K/uL   RBC 2.63 (L) 4.22 - 5.81 MIL/uL   Hemoglobin 8.2 (L) 13.0 - 17.0 g/dL   HCT 24.9 (L) 39.0 - 52.0 %   MCV 94.7 80.0 - 100.0 fL   MCH 31.2 26.0 - 34.0 pg   MCHC 32.9 30.0 - 36.0 g/dL   RDW 15.7 (H) 11.5 - 15.5 %   Platelets 200 150 - 400 K/uL   nRBC 0.0 0.0 - 0.2 %  Magnesium     Status: None  Collection Time: 03/28/19  6:33 AM  Result Value Ref Range   Magnesium 1.7 1.7 - 2.4 mg/dL  Phosphorus     Status: None   Collection Time: 03/28/19  6:33 AM  Result Value Ref Range   Phosphorus 2.9 2.5 - 4.6 mg/dL  Comprehensive metabolic panel     Status: Abnormal   Collection Time: 03/28/19  6:33 AM  Result Value Ref Range   Sodium 138 135 - 145 mmol/L   Potassium 3.2 (L) 3.5 - 5.1 mmol/L   Chloride 110 98 - 111 mmol/L   CO2 20 (L) 22 - 32 mmol/L   Glucose, Bld 85 70 - 99 mg/dL   BUN 11 8 - 23 mg/dL   Creatinine, Ser 1.36 (H) 0.61 - 1.24 mg/dL   Calcium 7.8 (L) 8.9 -  10.3 mg/dL   Total Protein 5.6 (L) 6.5 - 8.1 g/dL   Albumin 2.3 (L) 3.5 - 5.0 g/dL   AST 26 15 - 41 U/L   ALT 17 0 - 44 U/L   Alkaline Phosphatase 71 38 - 126 U/L   Total Bilirubin 0.7 0.3 - 1.2 mg/dL   GFR calc non Af Amer 52 (L) >60 mL/min   GFR calc Af Amer >60 >60 mL/min   Anion gap 8 5 - 15  Triglycerides     Status: Abnormal   Collection Time: 03/28/19  6:33 AM  Result Value Ref Range   Triglycerides 186 (H) <150 mg/dL  Heparin level (unfractionated)     Status: Abnormal   Collection Time: 03/28/19  6:34 AM  Result Value Ref Range   Heparin Unfractionated 0.25 (L) 0.30 - 0.70 IU/mL  Glucose, capillary     Status: None   Collection Time: 03/28/19  8:44 AM  Result Value Ref Range   Glucose-Capillary 83 70 - 99 mg/dL   CT ANGIO HEAD W OR WO CONTRAST  Result Date: 03/26/2019 CLINICAL DATA:  Follow-up examination for acute stroke, recent ICA and MCA stenting and aneurysm coiling EXAM: CT ANGIOGRAPHY HEAD AND NECK CT PERFUSION BRAIN TECHNIQUE: Multidetector CT imaging of the head and neck was performed using the standard protocol during bolus administration of intravenous contrast. Multiplanar CT image reconstructions and MIPs were obtained to evaluate the vascular anatomy. Carotid stenosis measurements (when applicable) are obtained utilizing NASCET criteria, using the distal internal carotid diameter as the denominator. Multiphase CT imaging of the brain was performed following IV bolus contrast injection. Subsequent parametric perfusion maps were calculated using RAPID software. CONTRAST:  132mL OMNIPAQUE IOHEXOL 350 MG/ML SOLN COMPARISON:  Comparison made with prior CTA from 03/19/2019. FINDINGS: CT HEAD FINDINGS Brain: Generalized age-related cerebral atrophy with chronic small vessel ischemic disease. No acute intracranial hemorrhage. No acute large vessel territory infarct. No mass lesion, midline shift or mass effect. No hydrocephalus. No extra-axial fluid collection. Vascular:  Streak artifact from interval coiling of left pericallosal aneurysm. Vascular stent has been placed within the left M1 segment. No hyperdense vessel. Scattered vascular calcifications noted within the carotid siphons. Skull: Scalp soft tissues within normal limits.  Calvarium intact. Sinuses/Orbits: Globes and orbital soft tissues within normal limits. Scattered mucosal thickening noted within the ethmoidal air cells and right maxillary sinus. Few small air-fluid levels noted. Mastoid air cells are clear. Patient is intubated. Other: None. CTA NECK FINDINGS Aortic arch: Visualized aortic arch of normal caliber. Bovine arch with common origin of the right brachiocephalic and left common carotid artery noted. Extensive noncalcified plaque seen throughout the visualized arch and about the origin  of the great vessels without hemodynamically significant stenosis. Irregular soft plaque and/or thrombus protruding into the lumen at the origin of the right brachiocephalic artery (series 12, image 38), unchanged. 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 Subclavian arteries otherwise irregular but patent without flow-limiting stenosis. Right carotid system: Right common carotid artery patent from its origin to the bifurcation without stenosis. Mild scattered plaque about the right bifurcation/proximal right ICA without hemodynamically significant stenosis. Right ICA irregular but patent to the skull base without stenosis, dissection, or occlusion. Left carotid system: Multifocal atheromatous irregularity within the left common carotid artery without stenosis. Interval placement of a vascular stent, proximal aspect at the origin of the left ICA. Mild-to-moderate stenoses involving the mid and distal aspect of the stent, measuring up to approximately 50% by NASCET criteria. Widely patent flow otherwise seen through the stent. No intraluminal thrombus or other complication. Left ICA irregular but  otherwise widely patent to the skull base without stenosis, dissection, or occlusion. Vertebral arteries: Both vertebral arteries arise from the subclavian arteries. Vertebral arteries remain widely patent within the neck without stenosis, dissection or occlusion. Skeleton: No acute osseous abnormality. No discrete osseous lesions. Moderate cervical spondylosis noted at C5-6. Patient is edentulous. Other neck: Endotracheal and enteric tubes in place. Postoperative changes from recent cutdown and open exposure of the left common carotid artery seen at the lower anterior left neck. Percutaneous drain remains in place within this region with associated scattered foci of soft tissue emphysema. Associated postoperative swelling and blood products present within this region as well. Small linear contrast blush within this region adjacent to the surgical drain likely reflects a small amount of persistent bleeding, likely venous in nature (series 5, image 36). No other active contrast extravasation. Upper chest: Layering bilateral pleural effusions with associated atelectasis partially visualized. Paraseptal emphysematous changes noted at the lung apices. Median sternotomy partially visualized. Review of the MIP images confirms the above findings CTA HEAD FINDINGS Anterior circulation: Petrous segments are widely patent. Scattered atherosclerotic change throughout the carotid siphons with associated moderate multifocal narrowing, unchanged. Left A1 widely patent. Hypoplastic right A1. Normal anterior communicating artery. Partially azygos ACA noted. 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 No visible neck remnant identified. Grossly patent flow through the adjacent stent. Interval placement of a vascular stent across the previously seen severe left M1 stenosis. Patent flow is seen through the stent. Left MCA branches perfused distally. Mild stenosis involving the proximal right M1 segment  noted, stable. Distal right MCA branches well perfused and stable from previous. Posterior circulation: Vertebral arteries remain widely patent to the vertebrobasilar junction. Posterior inferior cerebral arteries patent bilaterally. Basilar diffusely diminutive with associated mild multifocal narrowing. Superior cerebral arteries patent bilaterally. PCA supplied via hypoplastic P1 segments as well as robust bilateral posterior communicating arteries. Prominent atherosclerotic change throughout both PCAs with associated moderate to severe multifocal stenoses, right worse than left, grossly stable. Venous sinuses: Grossly patent allowing for timing of the contrast bolus Anatomic variants: Predominant fetal type origin of the PCAs. Hypoplastic right A1 segment. Review of the MIP images confirms the above findings CT Brain Perfusion Findings: CBF (<30%) Volume: 38mL Perfusion (Tmax>6.0s) volume: 81mL Mismatch Volume: 69mL Infarction Location:Negative CT perfusion for acute ischemia. On source perfusion maps, there is increased cerebral blood volume and cerebral blood flow with mildly decreased mean transit time and T-max, likely reflecting improved cerebrovascular flow to the left cerebral hemisphere due to interval stenting of the left ICA and MCA.  No other perfusion abnormality. IMPRESSION: CT HEAD IMPRESSION: 1. No acute intracranial abnormality. 2. 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 3. 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 CTA HEAD AND NECK IMPRESSION: 1. Negative CTA for emergent large vessel occlusion. 2. Interval stenting at the proximal left ICA and left M1 segment without complication. Patent flow seen through both stents. 3. 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 No residual neck remnant identified. 4. Postoperative changes from interval cutdown for left carotid artery exposure/access.  Surgical drain remains in place. Small blush of contrast adjacent to the drain consistent with a small focus of active bleeding, likely venous in nature. 5. Otherwise stable CTA with extensive atherosclerotic change elsewhere throughout the major arterial vasculature of the head and neck. CT PERFUSION IMPRESSION: 1. Negative CT perfusion for acute core infarct. 2. Increased cerebral blood flow and blood volume with decreased T-max within the left cerebral hemisphere, reflecting improved vascular flow to the left MCA distribution due to the left ICA and MCA stents. 3. Otherwise negative CT perfusion, with no other perfusion abnormality. These results were communicated to Dr. Lorraine Lax at 9:30 pmon 1/27/2021by text page via the [facility] messaging system. Electronically Signed   By: Jeannine Boga M.D.   On: 03/26/2019 22:49   CT ANGIO NECK W OR WO CONTRAST  Result Date: 03/26/2019 CLINICAL DATA:  Follow-up examination for acute stroke, recent ICA and MCA stenting and aneurysm coiling EXAM: CT ANGIOGRAPHY HEAD AND NECK CT PERFUSION BRAIN TECHNIQUE: Multidetector CT imaging of the head and neck was performed using the standard protocol during bolus administration of intravenous contrast. Multiplanar CT image reconstructions and MIPs were obtained to evaluate the vascular anatomy. Carotid stenosis measurements (when applicable) are obtained utilizing NASCET criteria, using the distal internal carotid diameter as the denominator. Multiphase CT imaging of the brain was performed following IV bolus contrast injection. Subsequent parametric perfusion maps were calculated using RAPID software. CONTRAST:  175mL OMNIPAQUE IOHEXOL 350 MG/ML SOLN COMPARISON:  Comparison made with prior CTA from 03/19/2019. FINDINGS: CT HEAD FINDINGS Brain: Generalized age-related cerebral atrophy with chronic small vessel ischemic disease. No acute intracranial hemorrhage. No acute large vessel territory infarct. No mass lesion, midline shift  or mass effect. No hydrocephalus. No extra-axial fluid collection. 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 Vascular stent has been placed within the left M1 segment. No hyperdense vessel. Scattered vascular calcifications noted within the carotid siphons. Skull: Scalp soft tissues within normal limits.  Calvarium intact. Sinuses/Orbits: Globes and orbital soft tissues within normal limits. Scattered mucosal thickening noted within the ethmoidal air cells and right maxillary sinus. Few small air-fluid levels noted. Mastoid air cells are clear. Patient is intubated. Other: None. CTA NECK FINDINGS Aortic arch: Visualized aortic arch of normal caliber. Bovine arch with common origin of the right brachiocephalic and left common carotid artery noted. Extensive noncalcified plaque seen throughout the visualized arch and about the origin of the great vessels without hemodynamically significant stenosis. Irregular soft plaque and/or thrombus protruding into the lumen at the origin of the right brachiocephalic artery (series 12, image 38), unchanged. 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 Subclavian arteries otherwise irregular but patent without flow-limiting stenosis. Right carotid system: Right common carotid artery patent from its origin to the bifurcation without stenosis. Mild scattered plaque about the right bifurcation/proximal right ICA without hemodynamically significant stenosis. Right ICA irregular but patent to the skull base without  stenosis, dissection, or occlusion. Left carotid system: Multifocal atheromatous irregularity within the left common carotid artery without stenosis. Interval placement of a vascular stent, proximal aspect at the origin of the left ICA. Mild-to-moderate stenoses involving the mid and distal aspect of the stent, measuring up to approximately 50% by NASCET criteria. Widely patent flow otherwise seen through the  stent. No intraluminal thrombus or other complication. Left ICA irregular but otherwise widely patent to the skull base without stenosis, dissection, or occlusion. Vertebral arteries: Both vertebral arteries arise from the subclavian arteries. Vertebral arteries remain widely patent within the neck without stenosis, dissection or occlusion. Skeleton: No acute osseous abnormality. No discrete osseous lesions. Moderate cervical spondylosis noted at C5-6. Patient is edentulous. Other neck: Endotracheal and enteric tubes in place. Postoperative changes from recent cutdown and open exposure of the left common carotid artery seen at the lower anterior left neck. Percutaneous drain remains in place within this region with associated scattered foci of soft tissue emphysema. Associated postoperative swelling and blood products present within this region as well. Small linear contrast blush within this region adjacent to the surgical drain likely reflects a small amount of persistent bleeding, likely venous in nature (series 5, image 36). No other active contrast extravasation. Upper chest: Layering bilateral pleural effusions with associated atelectasis partially visualized. Paraseptal emphysematous changes noted at the lung apices. Median sternotomy partially visualized. Review of the MIP images confirms the above findings CTA HEAD FINDINGS Anterior circulation: Petrous segments are widely patent. Scattered atherosclerotic change throughout the carotid siphons with associated moderate multifocal narrowing, unchanged. Left A1 widely patent. Hypoplastic right A1. Normal anterior communicating artery. Partially azygos ACA noted. 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 No visible neck remnant identified. Grossly patent flow through the adjacent stent. Interval placement of a vascular stent across the previously seen severe left M1 stenosis. Patent flow is seen through the stent. Left MCA branches  perfused distally. Mild stenosis involving the proximal right M1 segment noted, stable. Distal right MCA branches well perfused and stable from previous. Posterior circulation: Vertebral arteries remain widely patent to the vertebrobasilar junction. Posterior inferior cerebral arteries patent bilaterally. Basilar diffusely diminutive with associated mild multifocal narrowing. Superior cerebral arteries patent bilaterally. PCA supplied via hypoplastic P1 segments as well as robust bilateral posterior communicating arteries. Prominent atherosclerotic change throughout both PCAs with associated moderate to severe multifocal stenoses, right worse than left, grossly stable. Venous sinuses: Grossly patent allowing for timing of the contrast bolus Anatomic variants: Predominant fetal type origin of the PCAs. Hypoplastic right A1 segment. Review of the MIP images confirms the above findings CT Brain Perfusion Findings: CBF (<30%) Volume: 59mL Perfusion (Tmax>6.0s) volume: 57mL Mismatch Volume: 87mL Infarction Location:Negative CT perfusion for acute ischemia. On source perfusion maps, there is increased cerebral blood volume and cerebral blood flow with mildly decreased mean transit time and T-max, likely reflecting improved cerebrovascular flow to the left cerebral hemisphere due to interval stenting of the left ICA and MCA. No other perfusion abnormality. IMPRESSION: CT HEAD IMPRESSION: 1. No acute intracranial abnormality. 2. 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 3. 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 CTA HEAD AND NECK IMPRESSION: 1. Negative CTA for emergent large vessel occlusion. 2. Interval stenting at the proximal left ICA and left M1 segment without complication. Patent flow seen through both stents. 3. 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 No residual neck remnant identified. 4. Postoperative  changes  from interval cutdown for left carotid artery exposure/access. Surgical drain remains in place. Small blush of contrast adjacent to the drain consistent with a small focus of active bleeding, likely venous in nature. 5. Otherwise stable CTA with extensive atherosclerotic change elsewhere throughout the major arterial vasculature of the head and neck. CT PERFUSION IMPRESSION: 1. Negative CT perfusion for acute core infarct. 2. Increased cerebral blood flow and blood volume with decreased T-max within the left cerebral hemisphere, reflecting improved vascular flow to the left MCA distribution due to the left ICA and MCA stents. 3. Otherwise negative CT perfusion, with no other perfusion abnormality. These results were communicated to Dr. Lorraine Lax at 9:30 pmon 1/27/2021by text page via the [facility] messaging system. Electronically Signed   By: Jeannine Boga M.D.   On: 03/26/2019 22:49   CT CEREBRAL PERFUSION W CONTRAST  Result Date: 03/26/2019 CLINICAL DATA:  Follow-up examination for acute stroke, recent ICA and MCA stenting and aneurysm coiling EXAM: CT ANGIOGRAPHY HEAD AND NECK CT PERFUSION BRAIN TECHNIQUE: Multidetector CT imaging of the head and neck was performed using the standard protocol during bolus administration of intravenous contrast. Multiplanar CT image reconstructions and MIPs were obtained to evaluate the vascular anatomy. Carotid stenosis measurements (when applicable) are obtained utilizing NASCET criteria, using the distal internal carotid diameter as the denominator. Multiphase CT imaging of the brain was performed following IV bolus contrast injection. Subsequent parametric perfusion maps were calculated using RAPID software. CONTRAST:  182mL OMNIPAQUE IOHEXOL 350 MG/ML SOLN COMPARISON:  Comparison made with prior CTA from 03/19/2019. FINDINGS: CT HEAD FINDINGS Brain: Generalized age-related cerebral atrophy with chronic small vessel ischemic disease. No acute intracranial  hemorrhage. No acute large vessel territory infarct. No mass lesion, midline shift or mass effect. No hydrocephalus. No extra-axial fluid collection. 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 Vascular stent has been placed within the left M1 segment. No hyperdense vessel. Scattered vascular calcifications noted within the carotid siphons. Skull: Scalp soft tissues within normal limits.  Calvarium intact. Sinuses/Orbits: Globes and orbital soft tissues within normal limits. Scattered mucosal thickening noted within the ethmoidal air cells and right maxillary sinus. Few small air-fluid levels noted. Mastoid air cells are clear. Patient is intubated. Other: None. CTA NECK FINDINGS Aortic arch: Visualized aortic arch of normal caliber. Bovine arch with common origin of the right brachiocephalic and left common carotid artery noted. Extensive noncalcified plaque seen throughout the visualized arch and about the origin of the great vessels without hemodynamically significant stenosis. Irregular soft plaque and/or thrombus protruding into the lumen at the origin of the right brachiocephalic artery (series 12, image 38), unchanged. 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 Subclavian arteries otherwise irregular but patent without flow-limiting stenosis. Right carotid system: Right common carotid artery patent from its origin to the bifurcation without stenosis. Mild scattered plaque about the right bifurcation/proximal right ICA without hemodynamically significant stenosis. Right ICA irregular but patent to the skull base without stenosis, dissection, or occlusion. Left carotid system: Multifocal atheromatous irregularity within the left common carotid artery without stenosis. Interval placement of a vascular stent, proximal aspect at the origin of the left ICA. Mild-to-moderate stenoses involving the mid and distal aspect of the stent, measuring up to  approximately 50% by NASCET criteria. Widely patent flow otherwise seen through the stent. No intraluminal thrombus or other complication. Left ICA irregular but otherwise widely patent to the skull base without stenosis, dissection, or occlusion. Vertebral arteries: Both vertebral arteries arise from  the subclavian arteries. Vertebral arteries remain widely patent within the neck without stenosis, dissection or occlusion. Skeleton: No acute osseous abnormality. No discrete osseous lesions. Moderate cervical spondylosis noted at C5-6. Patient is edentulous. Other neck: Endotracheal and enteric tubes in place. Postoperative changes from recent cutdown and open exposure of the left common carotid artery seen at the lower anterior left neck. Percutaneous drain remains in place within this region with associated scattered foci of soft tissue emphysema. Associated postoperative swelling and blood products present within this region as well. Small linear contrast blush within this region adjacent to the surgical drain likely reflects a small amount of persistent bleeding, likely venous in nature (series 5, image 36). No other active contrast extravasation. Upper chest: Layering bilateral pleural effusions with associated atelectasis partially visualized. Paraseptal emphysematous changes noted at the lung apices. Median sternotomy partially visualized. Review of the MIP images confirms the above findings CTA HEAD FINDINGS Anterior circulation: Petrous segments are widely patent. Scattered atherosclerotic change throughout the carotid siphons with associated moderate multifocal narrowing, unchanged. Left A1 widely patent. Hypoplastic right A1. Normal anterior communicating artery. Partially azygos ACA noted. 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 No visible neck remnant identified. Grossly patent flow through the adjacent stent. Interval placement of a vascular stent across the previously  seen severe left M1 stenosis. Patent flow is seen through the stent. Left MCA branches perfused distally. Mild stenosis involving the proximal right M1 segment noted, stable. Distal right MCA branches well perfused and stable from previous. Posterior circulation: Vertebral arteries remain widely patent to the vertebrobasilar junction. Posterior inferior cerebral arteries patent bilaterally. Basilar diffusely diminutive with associated mild multifocal narrowing. Superior cerebral arteries patent bilaterally. PCA supplied via hypoplastic P1 segments as well as robust bilateral posterior communicating arteries. Prominent atherosclerotic change throughout both PCAs with associated moderate to severe multifocal stenoses, right worse than left, grossly stable. Venous sinuses: Grossly patent allowing for timing of the contrast bolus Anatomic variants: Predominant fetal type origin of the PCAs. Hypoplastic right A1 segment. Review of the MIP images confirms the above findings CT Brain Perfusion Findings: CBF (<30%) Volume: 10mL Perfusion (Tmax>6.0s) volume: 79mL Mismatch Volume: 22mL Infarction Location:Negative CT perfusion for acute ischemia. On source perfusion maps, there is increased cerebral blood volume and cerebral blood flow with mildly decreased mean transit time and T-max, likely reflecting improved cerebrovascular flow to the left cerebral hemisphere due to interval stenting of the left ICA and MCA. No other perfusion abnormality. IMPRESSION: CT HEAD IMPRESSION: 1. No acute intracranial abnormality. 2. 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 3. 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 CTA HEAD AND NECK IMPRESSION: 1. Negative CTA for emergent large vessel occlusion. 2. Interval stenting at the proximal left ICA and left M1 segment without complication. Patent flow seen through both stents. 3. Interval stenting and coiling of left pericallosal  aneurysm without complication. No residual neck remnant identified. 4. Postoperative changes from interval cutdown for left carotid artery exposure/access. Surgical drain remains in place. Small blush of contrast adjacent to the drain consistent with a small focus of active bleeding, likely venous in nature. 5. Otherwise stable CTA with extensive atherosclerotic change elsewhere throughout the major arterial vasculature of the head and neck. CT PERFUSION IMPRESSION: 1. Negative CT perfusion for acute core infarct. 2. Increased cerebral blood flow and blood volume with decreased T-max within the left cerebral hemisphere, reflecting improved vascular flow to the left MCA distribution due to  the left ICA and MCA stents. 3. Otherwise negative CT perfusion, with no other perfusion abnormality. These results were communicated to Dr. Lorraine Lax at 9:30 pmon 1/27/2021by text page via the [facility] messaging system. Electronically Signed   By: Jeannine Boga M.D.   On: 03/26/2019 22:49   DG CHEST PORT 1 VIEW  Result Date: 03/28/2019 CLINICAL DATA:  Hypertension, stroke EXAM: PORTABLE CHEST 1 VIEW COMPARISON:  Radiograph 03/26/2019 FINDINGS: Interval removal of the endotracheal and transesophageal tubes. Right upper extremity PICC tip terminates at the superior cavoatrial junction. Telemetry leads overlie the chest. Median sternotomy wires remain intact and aligned. Extensive CABG clips project over the cardiac silhouette. Slightly improved atelectasis when compared to prior. No acute osseous or soft tissue abnormality. IMPRESSION: Improving volumes. Stable cardiomegaly. Removal of the endotracheal and transesophageal tubes. Satisfactory positioning of the right upper extremity PICC. Electronically Signed   By: Lovena Le M.D.   On: 03/28/2019 05:52   DG Chest Port 1 View  Result Date: 03/26/2019 CLINICAL DATA:  Endotracheal tube placement. Hypertension. Coronary artery disease. EXAM: PORTABLE CHEST 1 VIEW COMPARISON:   03/17/2019 from [place]: Prior median sternotomy. Endotracheal tube terminates 5.5 cm above carina. Nasogastric terminates at the body of the stomach with the side port likely just at the gastroesophageal junction. Numerous leads and wires project over the chest. Cardiomegaly accentuated by AP portable technique. The apices are partially excluded. No pleural fluid. Low lung volumes with resultant pulmonary interstitial prominence. New subsegmental atelectasis at the left lung base. IMPRESSION: Nasogastric tube borderline low in position.  Consider advancement. Otherwise, appropriate position of support apparatus. Diminished lung volumes with new left lower lobe subsegmental atelectasis. Cardiomegaly without congestive failure. Electronically Signed   By: Abigail Miyamoto M.D.   On: 03/26/2019 16:16   DG C-Arm 1-60 Min-No Report  Result Date: 03/26/2019 Fluoroscopy was utilized by the requesting physician.  No radiographic interpretation.   ECHOCARDIOGRAM COMPLETE  Result Date: 03/27/2019   ECHOCARDIOGRAM REPORT   Patient Name:   KINNIE LAMBERTI [facility] Date of Exam: 03/27/2019 Medical Rec #:  [id number]                 Height:       77.0 in Accession #:    [id number]                Weight:       233.9 lb Date of Birth:  Apr 15, 1950                BSA:          2.39 m Patient Age:    68 years                  BP:           113/101 mmHg Patient Gender: M                         HR:           72 bpm. Exam Location:  Inpatient Procedure: 2D Echo Indications:    Acute Respiratory Insufficiency 518.82 / R06.89  History:        Patient has no prior history of Echocardiogram examinations.                 CAD; Stroke.  Sonographer:    Vikki Ports [place] Referring Phys: [place]  1. Left ventricular ejection fraction, by visual estimation, is 50 to 55%. The  left ventricle has low normal function. Left ventricular septal wall thickness was mildly increased. Mildly increased left  ventricular posterior wall thickness. There is no left ventricular hypertrophy.  2. Left ventricular diastolic parameters are consistent with Grade I diastolic dysfunction (impaired relaxation).  3. Global right ventricle has normal systolic function.The right ventricular size is normal. No increase in right ventricular wall thickness.  4. Left atrial size was severely dilated.  5. Right atrial size was severely dilated.  6. The mitral valve is normal in structure. Trivial mitral valve regurgitation. No evidence of mitral stenosis.  7. The tricuspid valve is normal in structure.  8. The tricuspid valve is normal in structure. Tricuspid valve regurgitation is trivial.  9. The aortic valve is tricuspid. Aortic valve regurgitation is not visualized. No evidence of aortic valve sclerosis or stenosis. 10. The pulmonic valve was normal in structure. Pulmonic valve regurgitation is trivial. 11. Normal pulmonary artery systolic pressure. 12. The inferior vena cava is normal in size with greater than 50% respiratory variability, suggesting right atrial pressure of 3 mmHg. FINDINGS  Left Ventricle: Left ventricular ejection fraction, by visual estimation, is 50 to 55%. The left ventricle has low normal function. The left ventricle is not well visualized. The left ventricular internal cavity size was the left ventricle is normal in size. Mildly increased left ventricular posterior wall thickness. There is no left ventricular hypertrophy. Left ventricular diastolic parameters are consistent with Grade I diastolic dysfunction (impaired relaxation). Normal left atrial pressure. Right Ventricle: The right ventricular size is normal. No increase in right ventricular wall thickness. Global RV systolic function is has normal systolic function. The tricuspid regurgitant velocity is 1.91 m/s, and with an assumed right atrial pressure  of 3 mmHg, the estimated right ventricular systolic pressure is normal at 17.6 mmHg. Left Atrium: Left  atrial size was severely dilated. Right Atrium: Right atrial size was severely dilated Pericardium: There is no evidence of pericardial effusion. Mitral Valve: The mitral valve is normal in structure. Trivial mitral valve regurgitation. No evidence of mitral valve stenosis by observation. Tricuspid Valve: The tricuspid valve is normal in structure. Tricuspid valve regurgitation is trivial. Aortic Valve: The aortic valve is tricuspid. Aortic valve regurgitation is not visualized. The aortic valve is structurally normal, with no evidence of sclerosis or stenosis. Aortic valve mean gradient measures 4.0 mmHg. Aortic valve peak gradient measures 9.1 mmHg. Aortic valve area, by VTI measures 4.61 cm. Pulmonic Valve: The pulmonic valve was normal in structure. Pulmonic valve regurgitation is trivial. Pulmonic regurgitation is trivial. Aorta: The aortic root, ascending aorta and aortic arch are all structurally normal, with no evidence of dilitation or obstruction. Venous: The inferior vena cava is normal in size with greater than 50% respiratory variability, suggesting right atrial pressure of 3 mmHg. IAS/Shunts: No atrial level shunt detected by color flow Doppler. There is no evidence of a patent foramen ovale. No ventricular septal defect is seen or detected. There is no evidence of an atrial septal defect.  LEFT VENTRICLE PLAX 2D LVIDd:         4.95 cm       Diastology LVIDs:         3.75 cm       LV e' lateral:   6.74 cm/s LV PW:         1.20 cm       LV E/e' lateral: 12.4 LV IVS:        1.20 cm       LV  e' medial:    8.81 cm/s LVOT diam:     2.70 cm       LV E/e' medial:  9.5 LV SV:         55 ml LV SV Index:   22.99 LVOT Area:     5.73 cm  LV Volumes (MOD) LV area d, A2C:    31.40 cm LV area d, A4C:    35.10 cm LV area s, A2C:    19.60 cm LV area s, A4C:    21.50 cm LV major d, A2C:   8.01 cm LV major d, A4C:   8.17 cm LV major s, A2C:   7.17 cm LV major s, A4C:   6.43 cm LV vol d, MOD A2C: 104.0 ml LV vol d,  MOD A4C: 131.0 ml LV vol s, MOD A2C: 47.5 ml LV vol s, MOD A4C: 62.1 ml LV SV MOD A2C:     56.5 ml LV SV MOD A4C:     131.0 ml LV SV MOD BP:      60.5 ml RIGHT VENTRICLE RV S prime:     15.40 cm/s TAPSE (M-mode): 2.1 cm LEFT ATRIUM              Index       RIGHT ATRIUM           Index LA diam:        4.05 cm  1.69 cm/m  RA Area:     33.00 cm LA Vol (A2C):   92.8 ml  38.82 ml/m RA Volume:   115.00 ml 48.10 ml/m LA Vol (A4C):   109.0 ml 45.59 ml/m LA Biplane Vol: 101.0 ml 42.25 ml/m  AORTIC VALVE AV Area (Vmax):    4.36 cm AV Area (Vmean):   4.36 cm AV Area (VTI):     4.61 cm AV Vmax:           151.00 cm/s AV Vmean:          97.300 cm/s AV VTI:            0.231 m AV Peak Grad:      9.1 mmHg AV Mean Grad:      4.0 mmHg LVOT Vmax:         115.00 cm/s LVOT Vmean:        74.100 cm/s LVOT VTI:          0.186 m LVOT/AV VTI ratio: 0.81  AORTA Ao Root diam: 3.80 cm MITRAL VALVE                        TRICUSPID VALVE MV Area (PHT): 3.74 cm             TR Peak grad:   14.6 mmHg MV PHT:        58.87 msec           TR Vmax:        191.00 cm/s MV Decel Time: 203 msec MV E velocity: 83.40 cm/s 103 cm/s  SHUNTS MV A velocity: 92.20 cm/s 70.3 cm/s Systemic VTI:  0.19 m MV E/A ratio:  0.90       1.5       Systemic Diam: 2.70 cm  Skeet Latch MD Electronically signed by Skeet Latch MD Signature Date/Time: 03/27/2019/4:31:15 PM    Final    VAS US CAROTID  Result Date: 03/27/2019 Carotid Arterial Duplex Study Indications:       CVA, Syncope, Left  stent and possible hematoma. Risk Factors:      Hypertension, current smoker, coronary artery disease. Limitations        Today's exam was limited due to the post surgical status of                    the patient, patient on a ventilator and movement. Comparison Study:  No prior study on file for comparison. Performing Technologist: Sharion Dove RVS  Examination Guidelines: A complete evaluation includes B-mode imaging, spectral Doppler, color Doppler, and power Doppler  as needed of all accessible portions of each vessel. Bilateral testing is considered an integral part of a complete examination. Limited examinations for reoccurring indications may be performed as noted.  Left Carotid Findings: +----------+--------+--------+--------+------------------+------------------+           PSV cm/sEDV cm/sStenosisPlaque DescriptionComments           +----------+--------+--------+--------+------------------+------------------+ CCA Prox  94      19                                intimal thickening +----------+--------+--------+--------+------------------+------------------+ CCA Distal81      15                                intimal thickening +----------+--------+--------+--------+------------------+------------------+ ICA Prox                                            stent              +----------+--------+--------+--------+------------------+------------------+ ICA Distal52      22                                                   +----------+--------+--------+--------+------------------+------------------+ ECA       55      12                                                   +----------+--------+--------+--------+------------------+------------------+ +----------+--------+--------+--------------+-------------------+           PSV cm/sEDV cm/sDescribe      Arm Pressure (mmHG) +----------+--------+--------+--------------+-------------------+ Subclavian                Not identified                    +----------+--------+--------+--------------+-------------------+ +---------+--------+--------+--------------+ VertebralPSV cm/sEDV cm/sNot identified +---------+--------+--------+--------------+  Left Stent(s): +--------------+--+--++++ Prox to Stent [id number] +--------------+--+--++++ Proximal [id number] +--------------+--+--++++ Mid Stent     [id number] +--------------+--+--++++    Summary:  Left Carotid: Patent stent.  Small area of mixed echoes noted at incision site,               no pseudoaneurysm or large hematoma noted. 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 *See table(s) above for measurements and observations.  Electronically signed by Antony Contras MD on 03/27/2019 at 12:48:21 PM.    Final    [place] EKG SITE RITE  Result Date: 03/27/2019  If [employer] not attached, placement could not be confirmed due to current cardiac rhythm.   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 Labs independently reviewed.  Records reviewed and summated above.  1. Does the need for close, 24 hr/day medical supervision in concert with the patient's rehab needs make it unreasonable for this patient to be served in a less intensive setting? Yes  2. 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 3. Due to bladder management, safety, skin/wound care, disease management, pain management and patient education, does the patient require 24 hr/day rehab nursing? Yes 4. Does the patient require coordinated care of a physician, rehab nurse, therapy disciplines of PT/OT/SLP to address physical and functional deficits in the context of the above medical diagnosis(es)? Yes Addressing deficits in the following areas: balance, endurance, locomotion, strength, transferring, bathing, dressing, toileting, swallowing and psychosocial support 5. Can the patient actively participate in an intensive therapy program of at least 3 hrs of therapy per day at least 5 days per  week? Potentially 6. The potential for patient to make measurable gains while on inpatient rehab is excellent 7. Anticipated functional outcomes upon discharge from inpatient rehab are min assist  with PT, min assist with OT, modified independent with SLP. 8. Estimated rehab length of stay to reach the above functional goals is: 17-20 days. 9. Anticipated discharge destination: Home 10. Overall Rehab/Functional Prognosis: good  RECOMMENDATIONS: This patient's condition is appropriate for continued rehabilitative care in the following setting: CIR when medically stable, medical work-up complete, and caregiver support available upon discharge. Patient has agreed to participate in recommended program. Yes Note that insurance prior authorization may be required for reimbursement for recommended care.  Comment: Rehab Admissions Coordinator to follow up.  I have personally performed a face to face diagnostic evaluation, including, but not limited to relevant history and physical exam findings, of this patient and developed relevant assessment and plan.  Additionally, I have reviewed and concur with the physician assistant's documentation above.   Delice Lesch, MD, ABPMR Lavon Paganini Angiulli, PA-C 03/28/2019

## 2019-03-28 NOTE — Progress Notes (Signed)
Rehab Admissions Coordinator Note:  Per PT request, patient was re-screened by Michel Santee for appropriateness for an Inpatient Acute Rehab Consult.  At this time, we are recommending Inpatient Rehab consult.  I will place an order so we can evaluate the patient.   Michel Santee 03/28/2019, 9:27 AM  I can be reached at [phone].

## 2019-03-28 NOTE — Progress Notes (Addendum)
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 Estanislado Pandy. Patient laying in bed resting comfortably. He responds to voice and answers questions appropriately. No complaints. Neurologically intact. Left carotid incision c/d/i.   Allergies: Patient has no known allergies.  Medications: Prior to Admission medications   Medication Sig Start Date End Date Taking? Authorizing Provider  aspirin EC 81 MG tablet Take 81 mg by mouth daily.   Yes [provider]  omega-3 acid ethyl esters (LOVAZA) 1 g capsule Take 1 g by mouth daily.   Yes [provider]     Vital Signs: BP (!) 122/54   Pulse 72   Temp 99.1 F (37.3 C) (Axillary)   Resp 16   Ht 6\' 5"  (1.956 m)   Wt 233 lb 14.5 oz (106.1 kg)   SpO2 99%   BMI 27.74 kg/m   Physical Exam Vitals and nursing note reviewed.  Constitutional:      General: He is not in acute distress.    Appearance: Normal appearance.  Pulmonary:     Effort: Pulmonary effort is normal. No respiratory distress.  Skin:    General: Skin is warm and dry.     Comments: Right carotid incision stable and healing well, no active bleeding or hematoma.  Neurological:     Mental Status: He is alert.     Comments: Alert, awake, and oriented x3. Speech and comprehension intact. PERRL bilaterally. No facial asymmetry. Can spontaneously move all extremities.  Psychiatric:        Mood and Affect: Mood normal.        Behavior: Behavior normal.     Imaging: CT ANGIO HEAD W OR WO CONTRAST  Result Date: 03/26/2019 CLINICAL DATA:  Follow-up examination for acute stroke, recent ICA and MCA stenting and  aneurysm coiling EXAM: CT ANGIOGRAPHY HEAD AND NECK CT PERFUSION BRAIN TECHNIQUE: Multidetector CT imaging of the head and neck was performed using the standard protocol during bolus administration of intravenous contrast. Multiplanar CT image reconstructions and MIPs were obtained to evaluate the vascular anatomy. Carotid stenosis measurements (when applicable) are obtained utilizing NASCET criteria, using the distal internal carotid diameter as the denominator. Multiphase CT imaging of the brain was performed following IV bolus contrast injection. Subsequent parametric perfusion maps were calculated using RAPID software. CONTRAST:  175mL OMNIPAQUE IOHEXOL 350 MG/ML SOLN COMPARISON:  Comparison made with prior CTA from 03/19/2019. FINDINGS: CT HEAD FINDINGS Brain: Generalized age-related cerebral atrophy with chronic small vessel ischemic disease. No acute intracranial hemorrhage. No acute large vessel territory infarct. No mass lesion, midline shift or mass effect. No hydrocephalus. No extra-axial fluid collection. 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 Vascular stent has been placed within the left M1 segment. No hyperdense vessel. Scattered vascular calcifications noted within the carotid siphons. Skull: Scalp soft tissues within normal limits.  Calvarium intact. Sinuses/Orbits: Globes and orbital soft tissues within normal limits. Scattered mucosal thickening noted within the ethmoidal air cells and right maxillary sinus. Few small air-fluid levels noted. Mastoid air cells are clear. Patient is intubated. Other: None. CTA NECK FINDINGS Aortic arch: Visualized aortic arch of normal caliber. Bovine arch with common origin of the right brachiocephalic and left common  carotid artery noted. Extensive noncalcified plaque seen throughout the visualized arch and about the origin of the great vessels without hemodynamically significant stenosis. Irregular soft plaque and/or  thrombus protruding into the lumen at the origin of the right brachiocephalic artery (series 12, image 38), unchanged. 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 Subclavian arteries otherwise irregular but patent without flow-limiting stenosis. Right carotid system: Right common carotid artery patent from its origin to the bifurcation without stenosis. Mild scattered plaque about the right bifurcation/proximal right ICA without hemodynamically significant stenosis. Right ICA irregular but patent to the skull base without stenosis, dissection, or occlusion. Left carotid system: Multifocal atheromatous irregularity within the left common carotid artery without stenosis. Interval placement of a vascular stent, proximal aspect at the origin of the left ICA. Mild-to-moderate stenoses involving the mid and distal aspect of the stent, measuring up to approximately 50% by NASCET criteria. Widely patent flow otherwise seen through the stent. No intraluminal thrombus or other complication. Left ICA irregular but otherwise widely patent to the skull base without stenosis, dissection, or occlusion. Vertebral arteries: Both vertebral arteries arise from the subclavian arteries. Vertebral arteries remain widely patent within the neck without stenosis, dissection or occlusion. Skeleton: No acute osseous abnormality. No discrete osseous lesions. Moderate cervical spondylosis noted at C5-6. Patient is edentulous. Other neck: Endotracheal and enteric tubes in place. Postoperative changes from recent cutdown and open exposure of the left common carotid artery seen at the lower anterior left neck. Percutaneous drain remains in place within this region with associated scattered foci of soft tissue emphysema. Associated postoperative swelling and blood products present within this region as well. Small linear contrast blush within this region adjacent to the surgical drain likely reflects a small amount of  persistent bleeding, likely venous in nature (series 5, image 36). No other active contrast extravasation. Upper chest: Layering bilateral pleural effusions with associated atelectasis partially visualized. Paraseptal emphysematous changes noted at the lung apices. Median sternotomy partially visualized. Review of the MIP images confirms the above findings CTA HEAD FINDINGS Anterior circulation: Petrous segments are widely patent. Scattered atherosclerotic change throughout the carotid siphons with associated moderate multifocal narrowing, unchanged. Left A1 widely patent. Hypoplastic right A1. Normal anterior communicating artery. Partially azygos ACA noted. 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 No visible neck remnant identified. Grossly patent flow through the adjacent stent. Interval placement of a vascular stent across the previously seen severe left M1 stenosis. Patent flow is seen through the stent. Left MCA branches perfused distally. Mild stenosis involving the proximal right M1 segment noted, stable. Distal right MCA branches well perfused and stable from previous. Posterior circulation: Vertebral arteries remain widely patent to the vertebrobasilar junction. Posterior inferior cerebral arteries patent bilaterally. Basilar diffusely diminutive with associated mild multifocal narrowing. Superior cerebral arteries patent bilaterally. PCA supplied via hypoplastic P1 segments as well as robust bilateral posterior communicating arteries. Prominent atherosclerotic change throughout both PCAs with associated moderate to severe multifocal stenoses, right worse than left, grossly stable. Venous sinuses: Grossly patent allowing for timing of the contrast bolus Anatomic variants: Predominant fetal type origin of the PCAs. Hypoplastic right A1 segment. Review of the MIP images confirms the above findings CT Brain Perfusion Findings: CBF (<30%) Volume: 86mL Perfusion (Tmax>6.0s) volume:  20mL Mismatch Volume: 31mL Infarction Location:Negative CT perfusion for acute ischemia. On source perfusion maps, there is increased cerebral blood volume and cerebral blood flow with mildly decreased mean transit time and T-max, likely reflecting improved cerebrovascular  flow to the left cerebral hemisphere due to interval stenting of the left ICA and MCA. No other perfusion abnormality. IMPRESSION: CT HEAD IMPRESSION: 1. No acute intracranial abnormality. 2. 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 3. 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 CTA HEAD AND NECK IMPRESSION: 1. Negative CTA for emergent large vessel occlusion. 2. Interval stenting at the proximal left ICA and left M1 segment without complication. Patent flow seen through both stents. 3. 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 No residual neck remnant identified. 4. Postoperative changes from interval cutdown for left carotid artery exposure/access. Surgical drain remains in place. Small blush of contrast adjacent to the drain consistent with a small focus of active bleeding, likely venous in nature. 5. Otherwise stable CTA with extensive atherosclerotic change elsewhere throughout the major arterial vasculature of the head and neck. CT PERFUSION IMPRESSION: 1. Negative CT perfusion for acute core infarct. 2. Increased cerebral blood flow and blood volume with decreased T-max within the left cerebral hemisphere, reflecting improved vascular flow to the left MCA distribution due to the left ICA and MCA stents. 3. Otherwise negative CT perfusion, with no other perfusion abnormality. These results were communicated to Dr. Lorraine Lax at 9:30 pmon 1/27/2021by text page via the [facility] messaging system. Electronically Signed   By: Jeannine Boga M.D.   On: 03/26/2019 22:49   CT ANGIO NECK W OR WO CONTRAST  Result Date: 03/26/2019 CLINICAL DATA:   Follow-up examination for acute stroke, recent ICA and MCA stenting and aneurysm coiling EXAM: CT ANGIOGRAPHY HEAD AND NECK CT PERFUSION BRAIN TECHNIQUE: Multidetector CT imaging of the head and neck was performed using the standard protocol during bolus administration of intravenous contrast. Multiplanar CT image reconstructions and MIPs were obtained to evaluate the vascular anatomy. Carotid stenosis measurements (when applicable) are obtained utilizing NASCET criteria, using the distal internal carotid diameter as the denominator. Multiphase CT imaging of the brain was performed following IV bolus contrast injection. Subsequent parametric perfusion maps were calculated using RAPID software. CONTRAST:  193mL OMNIPAQUE IOHEXOL 350 MG/ML SOLN COMPARISON:  Comparison made with prior CTA from 03/19/2019. FINDINGS: CT HEAD FINDINGS Brain: Generalized age-related cerebral atrophy with chronic small vessel ischemic disease. No acute intracranial hemorrhage. No acute large vessel territory infarct. No mass lesion, midline shift or mass effect. No hydrocephalus. No extra-axial fluid collection. 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 Vascular stent has been placed within the left M1 segment. No hyperdense vessel. Scattered vascular calcifications noted within the carotid siphons. Skull: Scalp soft tissues within normal limits.  Calvarium intact. Sinuses/Orbits: Globes and orbital soft tissues within normal limits. Scattered mucosal thickening noted within the ethmoidal air cells and right maxillary sinus. Few small air-fluid levels noted. Mastoid air cells are clear. Patient is intubated. Other: None. CTA NECK FINDINGS Aortic arch: Visualized aortic arch of normal caliber. Bovine arch with common origin of the right brachiocephalic and left common carotid artery noted. Extensive noncalcified plaque seen throughout the visualized arch and about the origin of the great vessels without  hemodynamically significant stenosis. Irregular soft plaque and/or thrombus protruding into the lumen at the origin of the right brachiocephalic artery (series 12, image 38), unchanged. 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 Subclavian arteries otherwise irregular but patent without flow-limiting stenosis. Right carotid system: Right common carotid artery patent from its origin to the bifurcation without stenosis. Mild scattered plaque about the right bifurcation/proximal  right ICA without hemodynamically significant stenosis. Right ICA irregular but patent to the skull base without stenosis, dissection, or occlusion. Left carotid system: Multifocal atheromatous irregularity within the left common carotid artery without stenosis. Interval placement of a vascular stent, proximal aspect at the origin of the left ICA. Mild-to-moderate stenoses involving the mid and distal aspect of the stent, measuring up to approximately 50% by NASCET criteria. Widely patent flow otherwise seen through the stent. No intraluminal thrombus or other complication. Left ICA irregular but otherwise widely patent to the skull base without stenosis, dissection, or occlusion. Vertebral arteries: Both vertebral arteries arise from the subclavian arteries. Vertebral arteries remain widely patent within the neck without stenosis, dissection or occlusion. Skeleton: No acute osseous abnormality. No discrete osseous lesions. Moderate cervical spondylosis noted at C5-6. Patient is edentulous. Other neck: Endotracheal and enteric tubes in place. Postoperative changes from recent cutdown and open exposure of the left common carotid artery seen at the lower anterior left neck. Percutaneous drain remains in place within this region with associated scattered foci of soft tissue emphysema. Associated postoperative swelling and blood products present within this region as well. Small linear contrast blush within this  region adjacent to the surgical drain likely reflects a small amount of persistent bleeding, likely venous in nature (series 5, image 36). No other active contrast extravasation. Upper chest: Layering bilateral pleural effusions with associated atelectasis partially visualized. Paraseptal emphysematous changes noted at the lung apices. Median sternotomy partially visualized. Review of the MIP images confirms the above findings CTA HEAD FINDINGS Anterior circulation: Petrous segments are widely patent. Scattered atherosclerotic change throughout the carotid siphons with associated moderate multifocal narrowing, unchanged. Left A1 widely patent. Hypoplastic right A1. Normal anterior communicating artery. Partially azygos ACA noted. 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 No visible neck remnant identified. Grossly patent flow through the adjacent stent. Interval placement of a vascular stent across the previously seen severe left M1 stenosis. Patent flow is seen through the stent. Left MCA branches perfused distally. Mild stenosis involving the proximal right M1 segment noted, stable. Distal right MCA branches well perfused and stable from previous. Posterior circulation: Vertebral arteries remain widely patent to the vertebrobasilar junction. Posterior inferior cerebral arteries patent bilaterally. Basilar diffusely diminutive with associated mild multifocal narrowing. Superior cerebral arteries patent bilaterally. PCA supplied via hypoplastic P1 segments as well as robust bilateral posterior communicating arteries. Prominent atherosclerotic change throughout both PCAs with associated moderate to severe multifocal stenoses, right worse than left, grossly stable. Venous sinuses: Grossly patent allowing for timing of the contrast bolus Anatomic variants: Predominant fetal type origin of the PCAs. Hypoplastic right A1 segment. Review of the MIP images confirms the above findings CT Brain  Perfusion Findings: CBF (<30%) Volume: 18mL Perfusion (Tmax>6.0s) volume: 20mL Mismatch Volume: 30mL Infarction Location:Negative CT perfusion for acute ischemia. On source perfusion maps, there is increased cerebral blood volume and cerebral blood flow with mildly decreased mean transit time and T-max, likely reflecting improved cerebrovascular flow to the left cerebral hemisphere due to interval stenting of the left ICA and MCA. No other perfusion abnormality. IMPRESSION: CT HEAD IMPRESSION: 1. No acute intracranial abnormality. 2. 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 3. 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 CTA HEAD AND NECK IMPRESSION: 1. Negative CTA for emergent large vessel occlusion. 2. Interval stenting at the proximal left ICA and left M1 segment without complication. Patent flow seen through both stents. 3. Interval stenting and  coiling of left pericallosal aneurysm without complication. No residual neck remnant identified. 4. Postoperative changes from interval cutdown for left carotid artery exposure/access. Surgical drain remains in place. Small blush of contrast adjacent to the drain consistent with a small focus of active bleeding, likely venous in nature. 5. Otherwise stable CTA with extensive atherosclerotic change elsewhere throughout the major arterial vasculature of the head and neck. CT PERFUSION IMPRESSION: 1. Negative CT perfusion for acute core infarct. 2. Increased cerebral blood flow and blood volume with decreased T-max within the left cerebral hemisphere, reflecting improved vascular flow to the left MCA distribution due to the left ICA and MCA stents. 3. Otherwise negative CT perfusion, with no other perfusion abnormality. These results were communicated to Dr. Lorraine Lax at 9:30 pmon 1/27/2021by text page via the [facility] messaging system. Electronically Signed   By: Jeannine Boga M.D.   On: 03/26/2019 22:49    US RENAL  Result Date: 03/24/2019 CLINICAL DATA:  Elevated BUN and creatinine EXAM: RENAL / URINARY TRACT ULTRASOUND COMPLETE COMPARISON:  None. FINDINGS: Right Kidney: Renal measurements: 10.4 x 5.4 x 5.4 cm = volume: 158 mL. 1.4 cm midpole cyst. Normal echotexture. No suspicious mass or hydronephrosis. Left Kidney: Renal measurements: 11.1 x 6.4 x 4.8 cm = volume: 179 mL. Echogenicity within normal limits. No mass or hydronephrosis visualized. Bladder: Decompressed with Foley catheter in place. Other: None. IMPRESSION: No acute findings.  No hydronephrosis. Electronically Signed   By: Rolm Baptise M.D.   On: 03/24/2019 21:58   CT CEREBRAL PERFUSION W CONTRAST  Result Date: 03/26/2019 CLINICAL DATA:  Follow-up examination for acute stroke, recent ICA and MCA stenting and aneurysm coiling EXAM: CT ANGIOGRAPHY HEAD AND NECK CT PERFUSION BRAIN TECHNIQUE: Multidetector CT imaging of the head and neck was performed using the standard protocol during bolus administration of intravenous contrast. Multiplanar CT image reconstructions and MIPs were obtained to evaluate the vascular anatomy. Carotid stenosis measurements (when applicable) are obtained utilizing NASCET criteria, using the distal internal carotid diameter as the denominator. Multiphase CT imaging of the brain was performed following IV bolus contrast injection. Subsequent parametric perfusion maps were calculated using RAPID software. CONTRAST:  152mL OMNIPAQUE IOHEXOL 350 MG/ML SOLN COMPARISON:  Comparison made with prior CTA from 03/19/2019. FINDINGS: CT HEAD FINDINGS Brain: Generalized age-related cerebral atrophy with chronic small vessel ischemic disease. No acute intracranial hemorrhage. No acute large vessel territory infarct. No mass lesion, midline shift or mass effect. No hydrocephalus. No extra-axial fluid collection. 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 Vascular stent has been placed within the  left M1 segment. No hyperdense vessel. Scattered vascular calcifications noted within the carotid siphons. Skull: Scalp soft tissues within normal limits.  Calvarium intact. Sinuses/Orbits: Globes and orbital soft tissues within normal limits. Scattered mucosal thickening noted within the ethmoidal air cells and right maxillary sinus. Few small air-fluid levels noted. Mastoid air cells are clear. Patient is intubated. Other: None. CTA NECK FINDINGS Aortic arch: Visualized aortic arch of normal caliber. Bovine arch with common origin of the right brachiocephalic and left common carotid artery noted. Extensive noncalcified plaque seen throughout the visualized arch and about the origin of the great vessels without hemodynamically significant stenosis. Irregular soft plaque and/or thrombus protruding into the lumen at the origin of the right brachiocephalic artery (series 12, image 38), unchanged. 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 Subclavian arteries otherwise irregular but patent without flow-limiting stenosis. Right carotid system: Right common carotid artery patent from  its origin to the bifurcation without stenosis. Mild scattered plaque about the right bifurcation/proximal right ICA without hemodynamically significant stenosis. Right ICA irregular but patent to the skull base without stenosis, dissection, or occlusion. Left carotid system: Multifocal atheromatous irregularity within the left common carotid artery without stenosis. Interval placement of a vascular stent, proximal aspect at the origin of the left ICA. Mild-to-moderate stenoses involving the mid and distal aspect of the stent, measuring up to approximately 50% by NASCET criteria. Widely patent flow otherwise seen through the stent. No intraluminal thrombus or other complication. Left ICA irregular but otherwise widely patent to the skull base without stenosis, dissection, or occlusion. Vertebral arteries: Both  vertebral arteries arise from the subclavian arteries. Vertebral arteries remain widely patent within the neck without stenosis, dissection or occlusion. Skeleton: No acute osseous abnormality. No discrete osseous lesions. Moderate cervical spondylosis noted at C5-6. Patient is edentulous. Other neck: Endotracheal and enteric tubes in place. Postoperative changes from recent cutdown and open exposure of the left common carotid artery seen at the lower anterior left neck. Percutaneous drain remains in place within this region with associated scattered foci of soft tissue emphysema. Associated postoperative swelling and blood products present within this region as well. Small linear contrast blush within this region adjacent to the surgical drain likely reflects a small amount of persistent bleeding, likely venous in nature (series 5, image 36). No other active contrast extravasation. Upper chest: Layering bilateral pleural effusions with associated atelectasis partially visualized. Paraseptal emphysematous changes noted at the lung apices. Median sternotomy partially visualized. Review of the MIP images confirms the above findings CTA HEAD FINDINGS Anterior circulation: Petrous segments are widely patent. Scattered atherosclerotic change throughout the carotid siphons with associated moderate multifocal narrowing, unchanged. Left A1 widely patent. Hypoplastic right A1. Normal anterior communicating artery. Partially azygos ACA noted. 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 No visible neck remnant identified. Grossly patent flow through the adjacent stent. Interval placement of a vascular stent across the previously seen severe left M1 stenosis. Patent flow is seen through the stent. Left MCA branches perfused distally. Mild stenosis involving the proximal right M1 segment noted, stable. Distal right MCA branches well perfused and stable from previous. Posterior circulation: Vertebral  arteries remain widely patent to the vertebrobasilar junction. Posterior inferior cerebral arteries patent bilaterally. Basilar diffusely diminutive with associated mild multifocal narrowing. Superior cerebral arteries patent bilaterally. PCA supplied via hypoplastic P1 segments as well as robust bilateral posterior communicating arteries. Prominent atherosclerotic change throughout both PCAs with associated moderate to severe multifocal stenoses, right worse than left, grossly stable. Venous sinuses: Grossly patent allowing for timing of the contrast bolus Anatomic variants: Predominant fetal type origin of the PCAs. Hypoplastic right A1 segment. Review of the MIP images confirms the above findings CT Brain Perfusion Findings: CBF (<30%) Volume: 64mL Perfusion (Tmax>6.0s) volume: 18mL Mismatch Volume: 53mL Infarction Location:Negative CT perfusion for acute ischemia. On source perfusion maps, there is increased cerebral blood volume and cerebral blood flow with mildly decreased mean transit time and T-max, likely reflecting improved cerebrovascular flow to the left cerebral hemisphere due to interval stenting of the left ICA and MCA. No other perfusion abnormality. IMPRESSION: CT HEAD IMPRESSION: 1. No acute intracranial abnormality. 2. 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 3. 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 CTA HEAD AND NECK IMPRESSION: 1. Negative CTA for emergent large vessel occlusion. 2. Interval stenting at the proximal left ICA and left  M1 segment without complication. Patent flow seen through both stents. 3. 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 No residual neck remnant identified. 4. Postoperative changes from interval cutdown for left carotid artery exposure/access. Surgical drain remains in place. Small blush of contrast adjacent to the drain consistent with a small focus of active  bleeding, likely venous in nature. 5. Otherwise stable CTA with extensive atherosclerotic change elsewhere throughout the major arterial vasculature of the head and neck. CT PERFUSION IMPRESSION: 1. Negative CT perfusion for acute core infarct. 2. Increased cerebral blood flow and blood volume with decreased T-max within the left cerebral hemisphere, reflecting improved vascular flow to the left MCA distribution due to the left ICA and MCA stents. 3. Otherwise negative CT perfusion, with no other perfusion abnormality. These results were communicated to Dr. Lorraine Lax at 9:30 pmon 1/27/2021by text page via the [facility] messaging system. Electronically Signed   By: Jeannine Boga M.D.   On: 03/26/2019 22:49   DG CHEST PORT 1 VIEW  Result Date: 03/28/2019 CLINICAL DATA:  Hypertension, stroke EXAM: PORTABLE CHEST 1 VIEW COMPARISON:  Radiograph 03/26/2019 FINDINGS: Interval removal of the endotracheal and transesophageal tubes. Right upper extremity PICC tip terminates at the superior cavoatrial junction. Telemetry leads overlie the chest. Median sternotomy wires remain intact and aligned. Extensive CABG clips project over the cardiac silhouette. Slightly improved atelectasis when compared to prior. No acute osseous or soft tissue abnormality. IMPRESSION: Improving volumes. Stable cardiomegaly. Removal of the endotracheal and transesophageal tubes. Satisfactory positioning of the right upper extremity PICC. Electronically Signed   By: Lovena Le M.D.   On: 03/28/2019 05:52   DG Chest Port 1 View  Result Date: 03/26/2019 CLINICAL DATA:  Endotracheal tube placement. Hypertension. Coronary artery disease. EXAM: PORTABLE CHEST 1 VIEW COMPARISON:  03/17/2019 from [place]: Prior median sternotomy. Endotracheal tube terminates 5.5 cm above carina. Nasogastric terminates at the body of the stomach with the side port likely just at the gastroesophageal junction. Numerous leads and wires project over  the chest. Cardiomegaly accentuated by AP portable technique. The apices are partially excluded. No pleural fluid. Low lung volumes with resultant pulmonary interstitial prominence. New subsegmental atelectasis at the left lung base. IMPRESSION: Nasogastric tube borderline low in position.  Consider advancement. Otherwise, appropriate position of support apparatus. Diminished lung volumes with new left lower lobe subsegmental atelectasis. Cardiomegaly without congestive failure. Electronically Signed   By: Abigail Miyamoto M.D.   On: 03/26/2019 16:16   DG C-Arm 1-60 Min-No Report  Result Date: 03/26/2019 Fluoroscopy was utilized by the requesting physician.  No radiographic interpretation.   ECHOCARDIOGRAM COMPLETE  Result Date: 03/27/2019   ECHOCARDIOGRAM REPORT   Patient Name:   Jeremy Sherman [facility] Date of Exam: 03/27/2019 Medical Rec #:  [id number]                 Height:       77.0 in Accession #:    [id number]                Weight:       233.9 lb Date of Birth:  Nov 22, 1950                BSA:          2.39 m Patient Age:    42 years                  BP:           113/101  mmHg Patient Gender: M                         HR:           72 bpm. Exam Location:  Inpatient Procedure: 2D Echo Indications:    Acute Respiratory Insufficiency 518.82 / R06.89  History:        Patient has no prior history of Echocardiogram examinations.                 CAD; Stroke.  Sonographer:    Vikki Ports [place] Referring Phys: [place]  1. Left ventricular ejection fraction, by visual estimation, is 50 to 55%. The left ventricle has low normal function. Left ventricular septal wall thickness was mildly increased. Mildly increased left ventricular posterior wall thickness. There is no left ventricular hypertrophy.  2. Left ventricular diastolic parameters are consistent with Grade I diastolic dysfunction (impaired relaxation).  3. Global right ventricle has normal systolic function.The right ventricular  size is normal. No increase in right ventricular wall thickness.  4. Left atrial size was severely dilated.  5. Right atrial size was severely dilated.  6. The mitral valve is normal in structure. Trivial mitral valve regurgitation. No evidence of mitral stenosis.  7. The tricuspid valve is normal in structure.  8. The tricuspid valve is normal in structure. Tricuspid valve regurgitation is trivial.  9. The aortic valve is tricuspid. Aortic valve regurgitation is not visualized. No evidence of aortic valve sclerosis or stenosis. 10. The pulmonic valve was normal in structure. Pulmonic valve regurgitation is trivial. 11. Normal pulmonary artery systolic pressure. 12. The inferior vena cava is normal in size with greater than 50% respiratory variability, suggesting right atrial pressure of 3 mmHg. FINDINGS  Left Ventricle: Left ventricular ejection fraction, by visual estimation, is 50 to 55%. The left ventricle has low normal function. The left ventricle is not well visualized. The left ventricular internal cavity size was the left ventricle is normal in size. Mildly increased left ventricular posterior wall thickness. There is no left ventricular hypertrophy. Left ventricular diastolic parameters are consistent with Grade I diastolic dysfunction (impaired relaxation). Normal left atrial pressure. Right Ventricle: The right ventricular size is normal. No increase in right ventricular wall thickness. Global RV systolic function is has normal systolic function. The tricuspid regurgitant velocity is 1.91 m/s, and with an assumed right atrial pressure  of 3 mmHg, the estimated right ventricular systolic pressure is normal at 17.6 mmHg. Left Atrium: Left atrial size was severely dilated. Right Atrium: Right atrial size was severely dilated Pericardium: There is no evidence of pericardial effusion. Mitral Valve: The mitral valve is normal in structure. Trivial mitral valve regurgitation. No evidence of mitral valve  stenosis by observation. Tricuspid Valve: The tricuspid valve is normal in structure. Tricuspid valve regurgitation is trivial. Aortic Valve: The aortic valve is tricuspid. Aortic valve regurgitation is not visualized. The aortic valve is structurally normal, with no evidence of sclerosis or stenosis. Aortic valve mean gradient measures 4.0 mmHg. Aortic valve peak gradient measures 9.1 mmHg. Aortic valve area, by VTI measures 4.61 cm. Pulmonic Valve: The pulmonic valve was normal in structure. Pulmonic valve regurgitation is trivial. Pulmonic regurgitation is trivial. Aorta: The aortic root, ascending aorta and aortic arch are all structurally normal, with no evidence of dilitation or obstruction. Venous: The inferior vena cava is normal in size with greater than 50% respiratory variability, suggesting right atrial pressure of 3 mmHg. IAS/Shunts: No atrial  level shunt detected by color flow Doppler. There is no evidence of a patent foramen ovale. No ventricular septal defect is seen or detected. There is no evidence of an atrial septal defect.  LEFT VENTRICLE PLAX 2D LVIDd:         4.95 cm       Diastology LVIDs:         3.75 cm       LV e' lateral:   6.74 cm/s LV PW:         1.20 cm       LV E/e' lateral: 12.4 LV IVS:        1.20 cm       LV e' medial:    8.81 cm/s LVOT diam:     2.70 cm       LV E/e' medial:  9.5 LV SV:         55 ml LV SV Index:   22.99 LVOT Area:     5.73 cm  LV Volumes (MOD) LV area d, A2C:    31.40 cm LV area d, A4C:    35.10 cm LV area s, A2C:    19.60 cm LV area s, A4C:    21.50 cm LV major d, A2C:   8.01 cm LV major d, A4C:   8.17 cm LV major s, A2C:   7.17 cm LV major s, A4C:   6.43 cm LV vol d, MOD A2C: 104.0 ml LV vol d, MOD A4C: 131.0 ml LV vol s, MOD A2C: 47.5 ml LV vol s, MOD A4C: 62.1 ml LV SV MOD A2C:     56.5 ml LV SV MOD A4C:     131.0 ml LV SV MOD BP:      60.5 ml RIGHT VENTRICLE RV S prime:     15.40 cm/s TAPSE (M-mode): 2.1 cm LEFT ATRIUM              Index       RIGHT  ATRIUM           Index LA diam:        4.05 cm  1.69 cm/m  RA Area:     33.00 cm LA Vol (A2C):   92.8 ml  38.82 ml/m RA Volume:   115.00 ml 48.10 ml/m LA Vol (A4C):   109.0 ml 45.59 ml/m LA Biplane Vol: 101.0 ml 42.25 ml/m  AORTIC VALVE AV Area (Vmax):    4.36 cm AV Area (Vmean):   4.36 cm AV Area (VTI):     4.61 cm AV Vmax:           151.00 cm/s AV Vmean:          97.300 cm/s AV VTI:            0.231 m AV Peak Grad:      9.1 mmHg AV Mean Grad:      4.0 mmHg LVOT Vmax:         115.00 cm/s LVOT Vmean:        74.100 cm/s LVOT VTI:          0.186 m LVOT/AV VTI ratio: 0.81  AORTA Ao Root diam: 3.80 cm MITRAL VALVE                        TRICUSPID VALVE MV Area (PHT): 3.74 cm             TR Peak grad:   14.6 mmHg MV PHT:  58.87 msec           TR Vmax:        191.00 cm/s MV Decel Time: 203 msec MV E velocity: 83.40 cm/s 103 cm/s  SHUNTS MV A velocity: 92.20 cm/s 70.3 cm/s Systemic VTI:  0.19 m MV E/A ratio:  0.90       1.5       Systemic Diam: 2.70 cm  Skeet Latch MD Electronically signed by Skeet Latch MD Signature Date/Time: 03/27/2019/4:31:15 PM    Final    VAS US CAROTID  Result Date: 03/27/2019 Carotid Arterial Duplex Study Indications:       CVA, Syncope, Left stent and possible hematoma. Risk Factors:      Hypertension, current smoker, coronary artery disease. Limitations        Today's exam was limited due to the post surgical status of                    the patient, patient on a ventilator and movement. Comparison Study:  No prior study on file for comparison. Performing Technologist: Sharion Dove RVS  Examination Guidelines: A complete evaluation includes B-mode imaging, spectral Doppler, color Doppler, and power Doppler as needed of all accessible portions of each vessel. Bilateral testing is considered an integral part of a complete examination. Limited examinations for reoccurring indications may be performed as noted.  Left Carotid Findings:  +----------+--------+--------+--------+------------------+------------------+           PSV cm/sEDV cm/sStenosisPlaque DescriptionComments           +----------+--------+--------+--------+------------------+------------------+ CCA Prox  94      19                                intimal thickening +----------+--------+--------+--------+------------------+------------------+ CCA Distal81      15                                intimal thickening +----------+--------+--------+--------+------------------+------------------+ ICA Prox                                            stent              +----------+--------+--------+--------+------------------+------------------+ ICA Distal52      22                                                   +----------+--------+--------+--------+------------------+------------------+ ECA       55      12                                                   +----------+--------+--------+--------+------------------+------------------+ +----------+--------+--------+--------------+-------------------+           PSV cm/sEDV cm/sDescribe      Arm Pressure (mmHG) +----------+--------+--------+--------------+-------------------+ Subclavian                Not identified                    +----------+--------+--------+--------------+-------------------+ +---------+--------+--------+--------------+  [place] cm/sEDV cm/sNot identified +---------+--------+--------+--------------+  Left Stent(s): +--------------+--+--++++ Prox to Stent 6211 +--------------+--+--++++ Proximal Stent8032 +--------------+--+--++++ Mid Stent     7227 +--------------+--+--++++    Summary:  Left Carotid: Patent stent. Small area of mixed echoes noted at incision site,               no pseudoaneurysm or large hematoma noted. 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 *See  table(s) above for measurements and observations.  Electronically signed by Antony Contras MD on 03/27/2019 at 12:48:21 PM.    Final    [place] EKG SITE RITE  Result Date: 03/27/2019 If Site Rite image not attached, placement could not be confirmed due to current cardiac rhythm.   Labs:  CBC: Recent Labs    03/25/19 0225 03/25/19 0225 03/26/19 0444 03/26/19 0737 03/26/19 1406 03/26/19 1648 03/27/19 0537 03/28/19 0633  WBC 4.8  --  4.7  --   --   --  6.3 7.1  HGB 10.3*   < > 9.6*   < > 8.5* 8.2* 8.8* 8.2*  HCT 30.0*   < > 28.7*   < > 25.0* 24.0* 25.8* 24.9*  PLT 177  --  195  --   --   --  173 200   < > = values in this interval not displayed.    COAGS: Recent Labs    03/21/19 0413  INR 1.0    BMP: Recent Labs    03/25/19 0225 03/25/19 0225 03/26/19 0444 03/26/19 0444 03/26/19 0737 03/26/19 0737 03/26/19 1250 03/26/19 1250 03/26/19 1406 03/26/19 1648 03/27/19 0537 03/28/19 0633  NA 135   < > 134*   < > 138   < > 136   < > 138 136 137 138  K 3.5   < > 3.4*   < > 3.4*   < > 3.9   < > 3.9 3.8 3.8 3.2*  CL 104   < > 103   < > 105  --  104  --   --   --  109 110  CO2 21*  --  22  --   --   --   --   --   --   --  20* 20*  GLUCOSE 106*   < > 105*   < > 96  --  136*  --   --   --  121* 85  BUN 14   < > 11   < > 10  --  10  --   --   --  9 11  CALCIUM 8.4*  --  8.4*  --   --   --   --   --   --   --  7.8* 7.8*  CREATININE 1.22   < > 1.19   < > 1.00  --  1.00  --   --   --  0.96 1.36*  GFRNONAA 60*  --  >60  --   --   --   --   --   --   --  >60 52*  GFRAA >60  --  >60  --   --   --   --   --   --   --  >60 >60   < > = values in this interval not displayed.    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 Estanislado Pandy.  Patient's condition stable and neurologically intact. Left carotid incision stable, JP drain removed today by [facility]- appreciate assistance with this patient. Continue taking Brilinta 90 mg twice daily and Aspirin 81 mg once daily. 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 Patient stable from NIR standpoint. Plan to follow-up with Dr. Estanislado Pandy in clinic 3 weeks after discharge- order placed to facilitate this. Further plans per CCM/neurology- appreciate and agree with management. Please call NIR with questions/concerns.   Electronically Signed: Earley Abide, PA-C 03/28/2019, 11:22 AM   I spent a total of 35 Minutes at the the patient's bedside AND on the patient's hospital floor or unit, greater than 50% of which was counseling/coordinating care for M1 stenosis, left ACA aneurysm and mild left ICA stenosis.

## 2019-03-28 NOTE — Progress Notes (Signed)
ANTICOAGULATION CONSULT NOTE  Pharmacy Consult for Heparin Indication: Left Atrial Appendage Thrombus  No Known Allergies  Patient Measurements: Height: 6\' 5"  (195.6 cm) Weight: 233 lb 14.5 oz (106.1 kg) IBW/kg (Calculated) : 89.1 Heparin Dosing Weight: 106.1 kg   Vital Signs: Temp: 99.1 F (37.3 C) (01/29 0400) Temp Source: Axillary (01/29 0400) Pulse Rate: 74 (01/29 0330)  Labs: Recent Labs    03/26/19 0444 03/26/19 0444 03/26/19 0737 03/26/19 1250 03/26/19 1406 03/26/19 1648 03/27/19 0537 03/27/19 0537 03/27/19 1338 03/27/19 2201 03/28/19 0633 03/28/19 0634  HGB 9.6*   < >   < > 8.2*   < > 8.2* 8.8*  --   --   --  8.2*  --   HCT 28.7*  --    < > 24.0*   < > 24.0* 25.8*  --   --   --  24.9*  --   PLT 195  --   --   --   --   --  173  --   --   --  200  --   HEPARINUNFRC 0.27*   < >  --   --   --   --  0.23*   < > 0.24* 0.31  --  0.25*  CREATININE 1.19  --    < > 1.00  --   --  0.96  --   --   --  1.36*  --    < > = values in this interval not displayed.    Estimated Creatinine Clearance: 63.7 mL/min (A) (by C-G formula based on SCr of 1.36 mg/dL (H)).   Assessment: 40 yom continuing on heparin for left atrial appendage thrombus shown on TEE from 03/20/19 at [facility]. Noted, brain MRI 03/17/19 revealed stroke and transferred to [facility]. He is S/P 4-vessel cerebral arteriogram on 1/23 and s/p revascularization left ICA stenosis and left MCA stenosis by IR on 1/27.  Per discussion with Neuro NP Burnetta Sabin) on 1/28, will increase heparin level goal to 0.3-0.5 with no boluses per stroke protocol.   Heparin level dropped to 0.25 this AM after therapeutic level last night. Hg drifting down to 8.2, plt WNL. No acute bleeding or issues with infusion per discussion with RN. Urine is pink but unchanged. Patient is also on DAPT.  Goal of Therapy:  Heparin level: 0.3-0.5 units/ml Monitor platelets by anticoagulation protocol: Yes   Plan:  - No bolus. Increase  heparin to 1050 units/hr - Check heparin level in 6 hours - Monitor daily heparin level and CBC, s/sx bleeding   Elicia Lamp, PharmD, BCPS Please check AMION for all [place] contact numbers Clinical Pharmacist 03/28/2019 8:22 AM

## 2019-03-28 NOTE — Progress Notes (Signed)
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 s/p arteriogram which additionally reveals L ACA pericallosal aneurysm. Pt now s/p L common carotid arteriogram with angioplasty of L MCA M1 segment, L ACA pericallosal aneurysm coiling, and L ICA stenting. Extubated 03/27/19.    PT Comments    Pt with improved activity tolerance this session, but limited by + symptomatic orthostatic vitals. Pt requires max assist +1-2 for bed mobility and transfers this session, but improved sitting balance only requiring assist with dynamic activity. Pt tolerated 15 minutes EOB sitting and x2 transfers, with emphasis on form and pt safety. PT to continue to follow acutely, continues to be an excellent CIR candidate at this time.   BP supine: 129/60 (79) BP post-stand: 108/48 (66) - symptomatic dizziness, decreased alertness BP return to supine: 137/67 (85)    Follow Up Recommendations  CIR;Supervision/Assistance - 24 hour     Equipment Recommendations  None recommended by PT    Recommendations for Other Services       Precautions / Restrictions Precautions Precautions: Fall Precaution Comments: bed rest orders in - spoke with Dr. Chase Sherman today, pt appropriate for OOB Restrictions Weight Bearing Restrictions: No    Mobility  Bed Mobility Overal bed mobility: Needs Assistance Bed Mobility: Supine to Sit;Sit to Supine     Supine to sit: Max assist Sit to supine: Max assist;+2 for physical assistance;+2 for safety/equipment   General bed mobility comments: max assist +1-2 for supine<>sit for trunk and LE management, scooting to and from EOB, scooting pt up in bed with use of boost  function.  Transfers Overall transfer level: Needs assistance Equipment used: Rolling walker (2 wheeled) Transfers: Sit to/from Stand Sit to Stand: Max assist;+2 physical assistance;+2 safety/equipment;From elevated surface         General transfer comment: Max assist +2 for power up, steadying, hip extension to neutral via PT facilitation, and uprigth posture. Sit to stand x2, + orthostatic BP from129/60 (79) to 108/48 (66) with decreased pt arousal and alertness.  Ambulation/Gait             General Gait Details: unable, orthostatic   Stairs             Wheelchair Mobility    Modified Rankin (Stroke Patients Only)       Balance Overall balance assessment: Needs assistance Sitting-balance support: Feet supported;No upper extremity supported Sitting balance-Leahy Scale: Fair Sitting balance - Comments: able to sit EOB unsupported, requires support when performing sitting exercise.   Standing balance support: Bilateral upper extremity supported;During functional activity Standing balance-Leahy Scale: Poor Standing balance comment: reliant on UE support, PT support                            Cognition Arousal/Alertness: Suspect due to medications;Lethargic Behavior During Therapy: Flat affect Overall Cognitive Status: Impaired/Different from baseline Area of Impairment: Safety/judgement;Problem solving;Awareness;Following commands                       Following Commands: Follows one step commands consistently;Follows one step commands with increased time Safety/Judgement: Decreased awareness of safety Awareness: Intellectual Problem Solving: Difficulty sequencing;Requires verbal cues;Slow processing;Requires tactile cues General Comments: pt very increased processing  and response time this session, with periods of quieting and eye closing requiring PT verbal and tactile cuing to wake. Decreased awareness of deficits, does not remember  PT/OT seeing him yesterday. Pt asking "where is my Jeremy Sherman? where is my Jeremy Sherman? I haven't seen them"      Exercises General Exercises - Lower Extremity Long Arc Quad: Both;5 reps;AAROM;Seated Heel Slides: Both;10 reps;AAROM;Supine    General Comments        Pertinent Vitals/Pain Pain Assessment: No/denies pain Faces Pain Scale: No hurt Pain Intervention(s): Limited activity within patient's tolerance;Monitored during session;Repositioned    Home Living                      Prior Function            PT Goals (current goals can now be found in the care plan section) Acute Rehab PT Goals Patient Stated Goal: get stronger PT Goal Formulation: With patient Time For Goal Achievement: 04/05/19 Potential to Achieve Goals: Good Progress towards PT goals: Progressing toward goals    Frequency    Min 4X/week      PT Plan Current plan remains appropriate    Co-evaluation              AM-PAC PT "6 Clicks" Mobility   Outcome Measure  Help needed turning from your back to your side while in a flat bed without using bedrails?: A Lot Help needed moving from lying on your back to sitting on the side of a flat bed without using bedrails?: Total Help needed moving to and from a bed to a chair (including a wheelchair)?: Total Help needed standing up from a chair using your arms (Sherman.g., wheelchair or bedside chair)?: Total Help needed to walk in hospital room?: Total Help needed climbing 3-5 steps with a railing? : Total 6 Click Score: 7    End of Session Equipment Utilized During Treatment: Gait belt Activity Tolerance: Patient limited by fatigue Patient left: with call bell/phone within reach;in bed;with bed alarm set(not up to chair due to pt transferring units) Nurse Communication: Mobility status PT Visit Diagnosis: Other abnormalities of gait and mobility (R26.89);Unsteadiness on feet (R26.81)     Time: [id number] PT Time Calculation (min) (ACUTE ONLY): 32  min  Charges:  $Therapeutic Activity: 8-22 mins $Neuromuscular Re-education: 8-22 mins                     Jeremy Sherman, PT Acute Rehabilitation Services Pager [phone]  Office [phone]    Jeremy Sherman D Jeremy Sherman 03/28/2019, 4:49 PM

## 2019-03-28 NOTE — Progress Notes (Signed)
POD#2, s/p left carotid exposure for neuro intervention 5cc drain output, mostly clotted blood in JP bulb Neck hematoma unchanged, remains soft JP removed Will not follow actively, please call with any questions  Wells Jalani Rominger

## 2019-03-29 LAB — GLUCOSE, CAPILLARY
Glucose-Capillary: 103 mg/dL — ABNORMAL HIGH (ref 70–99)
Glucose-Capillary: 105 mg/dL — ABNORMAL HIGH (ref 70–99)
Glucose-Capillary: 108 mg/dL — ABNORMAL HIGH (ref 70–99)
Glucose-Capillary: 109 mg/dL — ABNORMAL HIGH (ref 70–99)
Glucose-Capillary: 110 mg/dL — ABNORMAL HIGH (ref 70–99)
Glucose-Capillary: 118 mg/dL — ABNORMAL HIGH (ref 70–99)
Glucose-Capillary: 130 mg/dL — ABNORMAL HIGH (ref 70–99)

## 2019-03-29 LAB — CBC
HCT: 23.9 % — ABNORMAL LOW (ref 39.0–52.0)
Hemoglobin: 7.7 g/dL — ABNORMAL LOW (ref 13.0–17.0)
MCH: 31.4 pg (ref 26.0–34.0)
MCHC: 32.2 g/dL (ref 30.0–36.0)
MCV: 97.6 fL (ref 80.0–100.0)
Platelets: 190 10*3/uL (ref 150–400)
RBC: 2.45 MIL/uL — ABNORMAL LOW (ref 4.22–5.81)
RDW: 15.5 % (ref 11.5–15.5)
WBC: 6.6 10*3/uL (ref 4.0–10.5)
nRBC: 0 % (ref 0.0–0.2)

## 2019-03-29 LAB — URINALYSIS, ROUTINE W REFLEX MICROSCOPIC
Glucose, UA: 100 mg/dL — AB
Ketones, ur: NEGATIVE mg/dL
Nitrite: POSITIVE — AB
Protein, ur: 100 mg/dL — AB
Specific Gravity, Urine: 1.02 (ref 1.005–1.030)
pH: 6.5 (ref 5.0–8.0)

## 2019-03-29 LAB — URINALYSIS, MICROSCOPIC (REFLEX): RBC / HPF: >50 RBC/hpf (ref 0–5)

## 2019-03-29 LAB — BASIC METABOLIC PANEL
Anion gap: 9 (ref 5–15)
BUN: 13 mg/dL (ref 8–23)
CO2: 22 mmol/L (ref 22–32)
Calcium: 7.9 mg/dL — ABNORMAL LOW (ref 8.9–10.3)
Chloride: 108 mmol/L (ref 98–111)
Creatinine, Ser: 1.1 mg/dL (ref 0.61–1.24)
GFR calc Af Amer: >60 mL/min (ref >60)
GFR calc non Af Amer: >60 mL/min (ref >60)
Glucose, Bld: 117 mg/dL — ABNORMAL HIGH (ref 70–99)
Potassium: 3.4 mmol/L — ABNORMAL LOW (ref 3.5–5.1)
Sodium: 139 mmol/L (ref 135–145)

## 2019-03-29 LAB — PSA: Prostatic Specific Antigen: 0.7 ng/mL (ref 0.00–4.00)

## 2019-03-29 LAB — PROTIME-INR
INR: 1.4 — ABNORMAL HIGH (ref 0.8–1.2)
Prothrombin Time: 17 seconds — ABNORMAL HIGH (ref 11.4–15.2)

## 2019-03-29 LAB — URINE CULTURE: Culture: NO GROWTH

## 2019-03-29 LAB — MAGNESIUM: Magnesium: 2.3 mg/dL (ref 1.7–2.4)

## 2019-03-29 LAB — PHOSPHORUS: Phosphorus: 2.6 mg/dL (ref 2.5–4.6)

## 2019-03-29 MED ORDER — ATORVASTATIN CALCIUM 80 MG PO TABS
80.0000 mg | ORAL_TABLET | Freq: Every day | ORAL | Status: DC
  Administered 2019-03-29 – 2019-04-02 (×5): 80 mg via ORAL
  Filled 2019-03-29 (×5): qty 1

## 2019-03-29 MED ORDER — LIDOCAINE HCL URETHRAL/MUCOSAL 2 % EX GEL
1.0000 "application " | Freq: Once | CUTANEOUS | Status: AC
  Administered 2019-03-29: 1 via URETHRAL
  Filled 2019-03-29: qty 20

## 2019-03-29 MED ORDER — TRAMADOL HCL 50 MG PO TABS: 50.0000 mg | ORAL_TABLET | Freq: Four times a day (QID) | ORAL | Status: DC | PRN

## 2019-03-29 MED ORDER — INSULIN ASPART 100 UNIT/ML ~~LOC~~ SOLN
0.0000 [IU] | Freq: Three times a day (TID) | SUBCUTANEOUS | Status: DC
  Administered 2019-04-01 – 2019-04-02 (×3): 1 [IU] via SUBCUTANEOUS

## 2019-03-29 MED ORDER — POTASSIUM CHLORIDE CRYS ER 20 MEQ PO TBCR
40.0000 meq | EXTENDED_RELEASE_TABLET | Freq: Once | ORAL | Status: AC
  Administered 2019-03-29: 08:00:00 40 meq via ORAL
  Filled 2019-03-29: qty 2

## 2019-03-29 MED ORDER — TAMSULOSIN HCL 0.4 MG PO CAPS
0.4000 mg | ORAL_CAPSULE | Freq: Every day | ORAL | Status: DC
  Administered 2019-03-29 – 2019-04-03 (×6): 0.4 mg via ORAL
  Filled 2019-03-29 (×6): qty 1

## 2019-03-29 NOTE — Progress Notes (Signed)
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  He then presented to [facility] on 03/17/2019 for dizziness and syncope.  Work-up as below:   CT brain 03/17/19: 8x12m focal slight dense lesion in the left and anterior callosal region.   MRI brain 03/17/19: Early subacute infarct within the right cerebellum and right brachium pontis. Punctate acute left parietal lobe cortical infarct. [id number] focus of enhancement in the pericallosal region w imaging features most suggestive of ACA pericallosal aneurysm, CTA recommended. Chronic lacunar infarct in the right caudate nucleus  CTA head/neck: 4x7 mm anuerysm left pericallosal segment left ACA without evidence of rupture. Mild stenosis of right M1 segment, Moderate to severe stenosis left M1 segment poststenotic dilation. Mild stenoss in the PCA bilaterally. Atherosclerotic disease, mural thrombus in the prox left ICA narrowing the lumen by 50%. Eccentric atherosclerotic dsiease vs focal dissection in the left ICA below the skull base without significant stenosis  TEE 03/20/19: EF 45-50% , left atrial appendage thrombus, and mild MR  He underwent left common carotid artery angiogram followed by stent angioplasty of left MCM M1 segment. He was admitted to ICU post-procedure, then subsequently extubated and transferred back to hospitalist service 03/29/2019.   New events last 24 hours / Subjective: No acute events overnight.  States that he is feeling well.  Tolerated breakfast without any issues.  Assessment & Plan:   [facility]:    [facility]  Carotid stenosis   CAD (coronary artery disease)   Brain aneurysm   Middle cerebral artery stenosis   Aneurysm of anterior cerebral artery   Essential hypertension   Prediabetes   Dysphagia, post-stroke   Thrombus of left atrial appendage   Left MCA stenosis and aneurysm, left ACA pericallosal segment aneurysm, left ICA proximal stenosis  -S/p left common carotid artery angiogram, stent angioplasty left MCA, stent assisted coiling of the left ACA pericallosal aneurysm 03/26/2019 -Continue Eliquis, brilinta  -Follow up with Dr. DeEstanislado Pandyn 3 weeks after discharge   Acute respiratory failure, hypoxemic, post-procedure -Now extubated. Continue to wean to room air as able   Left atrial appendage thrombus -Continue Eliquis  -Follow up with cardiology   HLD -Continue lipitor   Hypothyroidism -Continue Synthroid  Acute kidney injury -Baseline Cr ~1 -Resolved  DM type 2, well controlled -Ha1c 6.4 -SSI   Right foot ischemia, PVD -Vascular surgery Dr. FiEden Latheas seen the patient and ordered duplex ultrasound of the right lower extremity with bilateral ABIs.  He has severe PAD, chronic.  Patient is asymptomatic at this time.  No intervention planned in the hospital.  Patient to follow-up as outpatient.  BPH -Previous hospitalist discussed with Dr. BeLouis Meckelwho recommends to continue with Flomax at this time and consider voiding trial once patient is more stable.  If patient is unable to urinate, reinsert catheter and follow-up urology in 2 weeks after discharge. -Coude catheter removed but now having urinary retention again. Replace coude catheter and plan to follow up with urology outpatient.   Hypokalemia -Replace, trend    DVT prophylaxis: Eliquis  Code Status: Full Family Communication: None at bedside Disposition Plan: Patient is from  home prior to admission. Currently in-hospital treatment needed due to stroke work up and treatment, now  medically stable and improved. Barrier(s) to discharge include CIR placement and suspect patient will discharge to CIR vs SNF.    Consultants:   Neurology  Vascular surgery  IR  PCCM   Antimicrobials:  Anti-infectives (From admission, onward)   Start     Dose/Rate Route Frequency Ordered Stop   03/28/19 1200  levofloxacin (LEVAQUIN) tablet 750 mg     750 mg Oral Daily 03/28/19 1026 03/31/19 1159   03/25/19 1215  levofloxacin (LEVAQUIN) IVPB 750 mg  Status:  Discontinued     750 mg 100 mL/hr over 90 Minutes Intravenous Every 24 hours 03/25/19 1206 03/28/19 1026       Objective: Vitals:   03/28/19 2142 03/28/19 2350 03/29/19 0411 03/29/19 0852  BP: (!) 141/79 134/89 137/77 134/73  Pulse: 72 75 69 67  Resp: 18 19 17 18   Temp: 99 F (37.2 C) 99.6 F (37.6 C) 99.5 F (37.5 C) 98.2 F (36.8 C)  TempSrc: Oral Oral Oral Oral  SpO2: 100% 100% 100% 100%  Weight:      Height:        Intake/Output Summary (Last 24 hours) at 03/29/2019 1005 Last data filed at 03/29/2019 0733 Gross per 24 hour  Intake 464.34 ml  Output 900 ml  Net -435.66 ml   Filed Weights   03/20/19 1842  Weight: 106.1 kg    Examination:  General exam: Appears calm and comfortable  Respiratory system: Clear to auscultation. Respiratory effort normal. No respiratory distress. No conversational dyspnea.  Cardiovascular system: S1 & S2 heard, RRR. No murmurs. No pedal edema. Gastrointestinal system: Abdomen is nondistended, soft and nontender. Normal bowel sounds heard. Central nervous system: Alert and oriented. No focal neurological deficits. Speech clear.  Extremities: Symmetric in appearance  Psychiatry: Judgement and insight appear normal. Mood & affect appropriate.   Data Reviewed: I have personally reviewed following labs and imaging studies  CBC: Recent Labs  Lab 03/25/19 0225 03/25/19 0225 03/26/19 0444 03/26/19 0737 03/26/19 1406 03/26/19 1648 03/27/19 0537 03/28/19 0633 03/29/19  0528  WBC 4.8  --  4.7  --   --   --  6.3 7.1 6.6  NEUTROABS  --   --   --   --   --   --  4.8  --   --   HGB 10.3*   < > 9.6*   < > 8.5* 8.2* 8.8* 8.2* 7.7*  HCT 30.0*   < > 28.7*   < > 25.0* 24.0* 25.8* 24.9* 23.9*  MCV 92.9  --  94.4  --   --   --  93.5 94.7 97.6  PLT 177  --  195  --   --   --  173 200 190   < > = values in this interval not displayed.   Basic Metabolic Panel: Recent Labs  Lab 03/25/19 0225 03/25/19 0225 03/26/19 0444 03/26/19 0444 03/26/19 0737 03/26/19 0737 03/26/19 1250 03/26/19 1250 03/26/19 1406 03/26/19 1648 03/27/19 0537 03/28/19 0633 03/29/19 0528  NA 135   < > 134*   < > 138   < > 136   < > 138 136 137 138 139  K 3.5   < > 3.4*   < > 3.4*   < > 3.9   < > 3.9 3.8 3.8 3.2* 3.4*  CL 104   < > 103   < > 105  --  104  --   --   --  109 110 108  CO2 21*  --  22  --   --   --   --   --   --   --  20* 20* 22  GLUCOSE 106*   < > 105*   < > 96  --  136*  --   --   --  121* 85 117*  BUN 14   < > 11   < > 10  --  10  --   --   --  9 11 13   CREATININE 1.22   < > 1.19   < > 1.00  --  1.00  --   --   --  0.96 1.36* 1.10  CALCIUM 8.4*  --  8.4*  --   --   --   --   --   --   --  7.8* 7.8* 7.9*  MG  --   --   --   --   --   --   --   --   --   --   --  1.7 2.3  PHOS  --   --   --   --   --   --   --   --   --   --   --  2.9 2.6   < > = values in this interval not displayed.   GFR: Estimated Creatinine Clearance: 78.8 mL/min (by C-G formula based on SCr of 1.1 mg/dL). Liver Function Tests: Recent Labs  Lab 03/28/19 0633 03/28/19 1553  AST 26 34  ALT 17 19  ALKPHOS 71 89  BILITOT 0.7 0.6  PROT 5.6* 6.1*  ALBUMIN 2.3* 2.3*   No results for input(s): LIPASE, AMYLASE in the last 168 hours. No results for input(s): AMMONIA in the last 168 hours. Coagulation Profile: Recent Labs  Lab 03/29/19 0528  INR 1.4*   Cardiac Enzymes: No results for input(s): CKTOTAL, CKMB, CKMBINDEX, TROPONINI in the last 168 hours. BNP (last 3 results) No results for  input(s): PROBNP in the last 8760 hours. HbA1C: No results for input(s): HGBA1C in the last 72 hours. CBG: Recent Labs  Lab 03/28/19 1616 03/28/19 2035 03/29/19 0024 03/29/19 0415 03/29/19 0718  GLUCAP 87 115* 118* 130* 105*   Lipid Profile: Recent Labs    03/27/19 0537 03/28/19 0633  TRIG 219* 186*   Thyroid Function Tests: No results for input(s): TSH, T4TOTAL, FREET4, T3FREE, THYROIDAB in the last 72 hours. Anemia Panel: No results for input(s): VITAMINB12, FOLATE, FERRITIN, TIBC, IRON, RETICCTPCT in the last 72 hours. Sepsis Labs: No results for input(s): PROCALCITON, LATICACIDVEN in the last 168 hours.  Recent Results (from the past 240 hour(s))  SARS CORONAVIRUS 2 (TAT 6-24 HRS) Nasopharyngeal Nasopharyngeal Swab     Status: None   Collection Time: 03/24/19  7:29 PM   Specimen: Nasopharyngeal Swab  Result Value Ref Range Status   SARS Coronavirus 2 NEGATIVE NEGATIVE Final    Comment: (NOTE) SARS-CoV-2 target nucleic acids are NOT DETECTED. The SARS-CoV-2 RNA is generally detectable in upper and lower respiratory specimens during the acute phase of infection. Negative results do not preclude SARS-CoV-2 infection, do not rule out co-infections with other pathogens, and should not be used as the sole basis for treatment or other patient management decisions. Negative results must be combined with clinical observations, patient history, and epidemiological information. The expected result is Negative. Fact Sheet for Patients: SugarRoll.be Fact  Sheet for Healthcare Providers: https://www.woods-mathews.com/ This test is not yet approved or cleared by the [place] FDA and  has been authorized for detection and/or diagnosis of SARS-CoV-2 by FDA under an Emergency Use Authorization (EUA). This EUA will remain  in effect (meaning this test can be used) for the duration of the COVID-19 declaration under Section 56 4(b)(1) of  the Act, 21 U.S.C. section 360bbb-3(b)(1), unless the authorization is terminated or revoked sooner. Performed at [facility], [place] [address], [place], [place] [address]   Surgical PCR screen     Status: None   Collection Time: 03/26/19  2:15 AM   Specimen: Nasal Mucosa; Nasal Swab  Result Value Ref Range Status   MRSA, PCR NEGATIVE NEGATIVE Final   Staphylococcus aureus NEGATIVE NEGATIVE Final    Comment: (NOTE) The Xpert SA Assay (FDA approved for NASAL specimens in patients 82 years of age and older), is one component of a comprehensive surveillance program. It is not intended to diagnose infection nor to guide or monitor treatment. Performed at [facility], [place] [address], [place], [place] [address]       Radiology Studies: DG CHEST PORT 1 VIEW  Result Date: 03/28/2019 CLINICAL DATA:  Hypertension, stroke EXAM: PORTABLE CHEST 1 VIEW COMPARISON:  Radiograph 03/26/2019 FINDINGS: Interval removal of the endotracheal and transesophageal tubes. Right upper extremity PICC tip terminates at the superior cavoatrial junction. Telemetry leads overlie the chest. Median sternotomy wires remain intact and aligned. Extensive CABG clips project over the cardiac silhouette. Slightly improved atelectasis when compared to prior. No acute osseous or soft tissue abnormality. IMPRESSION: Improving volumes. Stable cardiomegaly. Removal of the endotracheal and transesophageal tubes. Satisfactory positioning of the right upper extremity PICC. Electronically Signed   By: Lovena Le M.D.   On: 03/28/2019 05:52   ECHOCARDIOGRAM COMPLETE  Result Date: 03/27/2019   ECHOCARDIOGRAM REPORT   Patient Name:   JAMORION GOMILLION Creegan Date of Exam: 03/27/2019 Medical Rec #:  [id number]                 Height:       77.0 in Accession #:    [id number]                Weight:       233.9 lb Date of Birth:  02-Apr-1950                BSA:          2.39 m Patient Age:    46 years                  BP:            113/101 mmHg Patient Gender: M                         HR:           72 bpm. Exam Location:  Inpatient Procedure: 2D Echo Indications:    Acute Respiratory Insufficiency 518.82 / R06.89  History:        Patient has no prior history of Echocardiogram examinations.                 CAD; Stroke.  Sonographer:    Vikki Ports Turrentine Referring Phys: [place]  1. Left ventricular ejection fraction, by visual estimation, is 50 to 55%. The left ventricle has low normal function. Left ventricular septal wall thickness was mildly increased. Mildly  increased left ventricular posterior wall thickness. There is no left ventricular hypertrophy.  2. Left ventricular diastolic parameters are consistent with Grade I diastolic dysfunction (impaired relaxation).  3. Global right ventricle has normal systolic function.The right ventricular size is normal. No increase in right ventricular wall thickness.  4. Left atrial size was severely dilated.  5. Right atrial size was severely dilated.  6. The mitral valve is normal in structure. Trivial mitral valve regurgitation. No evidence of mitral stenosis.  7. The tricuspid valve is normal in structure.  8. The tricuspid valve is normal in structure. Tricuspid valve regurgitation is trivial.  9. The aortic valve is tricuspid. Aortic valve regurgitation is not visualized. No evidence of aortic valve sclerosis or stenosis. 10. The pulmonic valve was normal in structure. Pulmonic valve regurgitation is trivial. 11. Normal pulmonary artery systolic pressure. 12. The inferior vena cava is normal in size with greater than 50% respiratory variability, suggesting right atrial pressure of 3 mmHg. FINDINGS  Left Ventricle: Left ventricular ejection fraction, by visual estimation, is 50 to 55%. The left ventricle has low normal function. The left ventricle is not well visualized. The left ventricular internal cavity size was the left ventricle is normal in size. Mildly increased  left ventricular posterior wall thickness. There is no left ventricular hypertrophy. Left ventricular diastolic parameters are consistent with Grade I diastolic dysfunction (impaired relaxation). Normal left atrial pressure. Right Ventricle: The right ventricular size is normal. No increase in right ventricular wall thickness. Global RV systolic function is has normal systolic function. The tricuspid regurgitant velocity is 1.91 m/s, and with an assumed right atrial pressure  of 3 mmHg, the estimated right ventricular systolic pressure is normal at 17.6 mmHg. Left Atrium: Left atrial size was severely dilated. Right Atrium: Right atrial size was severely dilated Pericardium: There is no evidence of pericardial effusion. Mitral Valve: The mitral valve is normal in structure. Trivial mitral valve regurgitation. No evidence of mitral valve stenosis by observation. Tricuspid Valve: The tricuspid valve is normal in structure. Tricuspid valve regurgitation is trivial. Aortic Valve: The aortic valve is tricuspid. Aortic valve regurgitation is not visualized. The aortic valve is structurally normal, with no evidence of sclerosis or stenosis. Aortic valve mean gradient measures 4.0 mmHg. Aortic valve peak gradient measures 9.1 mmHg. Aortic valve area, by VTI measures 4.61 cm. Pulmonic Valve: The pulmonic valve was normal in structure. Pulmonic valve regurgitation is trivial. Pulmonic regurgitation is trivial. Aorta: The aortic root, ascending aorta and aortic arch are all structurally normal, with no evidence of dilitation or obstruction. Venous: The inferior vena cava is normal in size with greater than 50% respiratory variability, suggesting right atrial pressure of 3 mmHg. IAS/Shunts: No atrial level shunt detected by color flow Doppler. There is no evidence of a patent foramen ovale. No ventricular septal defect is seen or detected. There is no evidence of an atrial septal defect.  LEFT VENTRICLE PLAX 2D LVIDd:          4.95 cm       Diastology LVIDs:         3.75 cm       LV e' lateral:   6.74 cm/s LV PW:         1.20 cm       LV E/e' lateral: 12.4 LV IVS:        1.20 cm       LV e' medial:    8.81 cm/s LVOT diam:     2.70 cm  LV E/e' medial:  9.5 LV SV:         55 ml LV SV Index:   22.99 LVOT Area:     5.73 cm  LV Volumes (MOD) LV area d, A2C:    31.40 cm LV area d, A4C:    35.10 cm LV area s, A2C:    19.60 cm LV area s, A4C:    21.50 cm LV major d, A2C:   8.01 cm LV major d, A4C:   8.17 cm LV major s, A2C:   7.17 cm LV major s, A4C:   6.43 cm LV vol d, MOD A2C: 104.0 ml LV vol d, MOD A4C: 131.0 ml LV vol s, MOD A2C: 47.5 ml LV vol s, MOD A4C: 62.1 ml LV SV MOD A2C:     56.5 ml LV SV MOD A4C:     131.0 ml LV SV MOD BP:      60.5 ml RIGHT VENTRICLE RV S prime:     15.40 cm/s TAPSE (M-mode): 2.1 cm LEFT ATRIUM              Index       RIGHT ATRIUM           Index LA diam:        4.05 cm  1.69 cm/m  RA Area:     33.00 cm LA Vol (A2C):   92.8 ml  38.82 ml/m RA Volume:   115.00 ml 48.10 ml/m LA Vol (A4C):   109.0 ml 45.59 ml/m LA Biplane Vol: 101.0 ml 42.25 ml/m  AORTIC VALVE AV Area (Vmax):    4.36 cm AV Area (Vmean):   4.36 cm AV Area (VTI):     4.61 cm AV Vmax:           151.00 cm/s AV Vmean:          97.300 cm/s AV VTI:            0.231 m AV Peak Grad:      9.1 mmHg AV Mean Grad:      4.0 mmHg LVOT Vmax:         115.00 cm/s LVOT Vmean:        74.100 cm/s LVOT VTI:          0.186 m LVOT/AV VTI ratio: 0.81  AORTA Ao Root diam: 3.80 cm MITRAL VALVE                        TRICUSPID VALVE MV Area (PHT): 3.74 cm             TR Peak grad:   14.6 mmHg MV PHT:        58.87 msec           TR Vmax:        191.00 cm/s MV Decel Time: 203 msec MV E velocity: 83.40 cm/s 103 cm/s  SHUNTS MV A velocity: 92.20 cm/s 70.3 cm/s Systemic VTI:  0.19 m MV E/A ratio:  0.90       1.5       Systemic Diam: 2.70 cm  Skeet Latch MD Electronically signed by Skeet Latch MD Signature Date/Time: 03/27/2019/4:31:15 PM    Final    [place]  EKG SITE RITE  Result Date: 03/27/2019 If Site Rite image not attached, placement could not be confirmed due to current cardiac rhythm.     Scheduled Meds: . apixaban  5 mg Oral BID  . atorvastatin  80  mg Oral q1800  . chlorhexidine gluconate (MEDLINE KIT)  15 mL Mouth Rinse BID  . Chlorhexidine Gluconate Cloth  6 each Topical Daily  . insulin aspart  0-9 Units Subcutaneous TID WC  . levofloxacin  750 mg Oral Daily  . levothyroxine  100 mcg Per Tube Q0600  . lidocaine  1 application Urethral Once  . pantoprazole  40 mg Oral QHS  . sodium chloride flush  10-40 mL Intracatheter Q12H  . sodium chloride flush  10-40 mL Intracatheter Q12H  . tamsulosin  0.4 mg Oral Daily  . ticagrelor  90 mg Oral BID   Continuous Infusions: . sodium chloride Stopped (03/26/19 1727)     LOS: 9 days      Time spent: 45 minutes   Dessa Phi, DO [facility] 03/29/2019, 10:05 AM   Available via Epic secure chat 7am-7pm After these hours, please refer to coverage provider listed on [email or web address]

## 2019-03-29 NOTE — Progress Notes (Signed)
STROKE TEAM PROGRESS NOTE   INTERVAL HISTORY Pt lying in bed, sleepy and eyes closed, however, orientated to place, time and age, following all simple commands, neuro stable, no acute event overnight.   Vitals:   03/28/19 2142 03/28/19 2350 03/29/19 0411 03/29/19 0852  BP: (!) 141/79 134/89 137/77 134/73  Pulse: 72 75 69 67  Resp: 18 19 17 18   Temp: 99 F (37.2 C) 99.6 F (37.6 C) 99.5 F (37.5 C) 98.2 F (36.8 C)  TempSrc: Oral Oral Oral Oral  SpO2: 100% 100% 100% 100%  Weight:      Height:        CBC:  Recent Labs  Lab 03/27/19 0537 03/27/19 0537 03/28/19 0633 03/29/19 0528  WBC 6.3   < > 7.1 6.6  NEUTROABS 4.8  --   --   --   HGB 8.8*   < > 8.2* 7.7*  HCT 25.8*   < > 24.9* 23.9*  MCV 93.5   < > 94.7 97.6  PLT 173   < > 200 190   < > = values in this interval not displayed.    Basic Metabolic Panel:  Recent Labs  Lab 03/28/19 0633 03/29/19 0528  NA 138 139  K 3.2* 3.4*  CL 110 108  CO2 20* 22  GLUCOSE 85 117*  BUN 11 13  CREATININE 1.36* 1.10  CALCIUM 7.8* 7.9*  MG 1.7 2.3  PHOS 2.9 2.6   Lipid Panel:     Component Value Date/Time   CHOL 243 (H) 03/21/2019 0413   TRIG 186 (H) 03/28/2019 0633   HDL 37 (L) 03/21/2019 0413   CHOLHDL 6.6 03/21/2019 0413   VLDL 29 03/21/2019 0413   LDLCALC 177 (H) 03/21/2019 0413   HgbA1c:  Lab Results  Component Value Date   HGBA1C 6.4 (H) 03/21/2019   Urine Drug Screen: No results found for: LABOPIA, COCAINSCRNUR, LABBENZ, AMPHETMU, THCU, LABBARB  Alcohol Level No results found for: ETH  IMAGING past 48 hours DG CHEST PORT 1 VIEW  Result Date: 03/28/2019 CLINICAL DATA:  Hypertension, stroke EXAM: PORTABLE CHEST 1 VIEW COMPARISON:  Radiograph 03/26/2019 FINDINGS: Interval removal of the endotracheal and transesophageal tubes. Right upper extremity PICC tip terminates at the superior cavoatrial junction. Telemetry leads overlie the chest. Median sternotomy wires remain intact and aligned. Extensive CABG clips  project over the cardiac silhouette. Slightly improved atelectasis when compared to prior. No acute osseous or soft tissue abnormality. IMPRESSION: Improving volumes. Stable cardiomegaly. Removal of the endotracheal and transesophageal tubes. Satisfactory positioning of the right upper extremity PICC. Electronically Signed   By: Lovena Le M.D.   On: 03/28/2019 05:52   ECHOCARDIOGRAM COMPLETE  Result Date: 03/27/2019   ECHOCARDIOGRAM REPORT   Patient Name:   KEYONTAE BALUYOT [facility] Date of Exam: 03/27/2019 Medical Rec #:  [id number]                 Height:       77.0 in Accession #:    [id number]                Weight:       233.9 lb Date of Birth:  1950-11-28                BSA:          2.39 m Patient Age:    69 years                  BP:  113/101 mmHg Patient Gender: M                         HR:           72 bpm. Exam Location:  Inpatient Procedure: 2D Echo Indications:    Acute Respiratory Insufficiency 518.82 / R06.89  History:        Patient has no prior history of Echocardiogram examinations.                 CAD; Stroke.  Sonographer:    Vikki Ports Turrentine Referring Phys: [place]  1. Left ventricular ejection fraction, by visual estimation, is 50 to 55%. The left ventricle has low normal function. Left ventricular septal wall thickness was mildly increased. Mildly increased left ventricular posterior wall thickness. There is no left ventricular hypertrophy.  2. Left ventricular diastolic parameters are consistent with Grade I diastolic dysfunction (impaired relaxation).  3. Global right ventricle has normal systolic function.The right ventricular size is normal. No increase in right ventricular wall thickness.  4. Left atrial size was severely dilated.  5. Right atrial size was severely dilated.  6. The mitral valve is normal in structure. Trivial mitral valve regurgitation. No evidence of mitral stenosis.  7. The tricuspid valve is normal in structure.  8. The  tricuspid valve is normal in structure. Tricuspid valve regurgitation is trivial.  9. The aortic valve is tricuspid. Aortic valve regurgitation is not visualized. No evidence of aortic valve sclerosis or stenosis. 10. The pulmonic valve was normal in structure. Pulmonic valve regurgitation is trivial. 11. Normal pulmonary artery systolic pressure. 12. The inferior vena cava is normal in size with greater than 50% respiratory variability, suggesting right atrial pressure of 3 mmHg. FINDINGS  Left Ventricle: Left ventricular ejection fraction, by visual estimation, is 50 to 55%. The left ventricle has low normal function. The left ventricle is not well visualized. The left ventricular internal cavity size was the left ventricle is normal in size. Mildly increased left ventricular posterior wall thickness. There is no left ventricular hypertrophy. Left ventricular diastolic parameters are consistent with Grade I diastolic dysfunction (impaired relaxation). Normal left atrial pressure. Right Ventricle: The right ventricular size is normal. No increase in right ventricular wall thickness. Global RV systolic function is has normal systolic function. The tricuspid regurgitant velocity is 1.91 m/s, and with an assumed right atrial pressure  of 3 mmHg, the estimated right ventricular systolic pressure is normal at 17.6 mmHg. Left Atrium: Left atrial size was severely dilated. Right Atrium: Right atrial size was severely dilated Pericardium: There is no evidence of pericardial effusion. Mitral Valve: The mitral valve is normal in structure. Trivial mitral valve regurgitation. No evidence of mitral valve stenosis by observation. Tricuspid Valve: The tricuspid valve is normal in structure. Tricuspid valve regurgitation is trivial. Aortic Valve: The aortic valve is tricuspid. Aortic valve regurgitation is not visualized. The aortic valve is structurally normal, with no evidence of sclerosis or stenosis. Aortic valve mean  gradient measures 4.0 mmHg. Aortic valve peak gradient measures 9.1 mmHg. Aortic valve area, by VTI measures 4.61 cm. Pulmonic Valve: The pulmonic valve was normal in structure. Pulmonic valve regurgitation is trivial. Pulmonic regurgitation is trivial. Aorta: The aortic root, ascending aorta and aortic arch are all structurally normal, with no evidence of dilitation or obstruction. Venous: The inferior vena cava is normal in size with greater than 50% respiratory variability, suggesting right atrial pressure of 3 mmHg. IAS/Shunts: No  atrial level shunt detected by color flow Doppler. There is no evidence of a patent foramen ovale. No ventricular septal defect is seen or detected. There is no evidence of an atrial septal defect.  LEFT VENTRICLE PLAX 2D LVIDd:         4.95 cm       Diastology LVIDs:         3.75 cm       LV e' lateral:   6.74 cm/s LV PW:         1.20 cm       LV E/e' lateral: 12.4 LV IVS:        1.20 cm       LV e' medial:    8.81 cm/s LVOT diam:     2.70 cm       LV E/e' medial:  9.5 LV SV:         55 ml LV SV Index:   22.99 LVOT Area:     5.73 cm  LV Volumes (MOD) LV area d, A2C:    31.40 cm LV area d, A4C:    35.10 cm LV area s, A2C:    19.60 cm LV area s, A4C:    21.50 cm LV major d, A2C:   8.01 cm LV major d, A4C:   8.17 cm LV major s, A2C:   7.17 cm LV major s, A4C:   6.43 cm LV vol d, MOD A2C: 104.0 ml LV vol d, MOD A4C: 131.0 ml LV vol s, MOD A2C: 47.5 ml LV vol s, MOD A4C: 62.1 ml LV SV MOD A2C:     56.5 ml LV SV MOD A4C:     131.0 ml LV SV MOD BP:      60.5 ml RIGHT VENTRICLE RV S prime:     15.40 cm/s TAPSE (M-mode): 2.1 cm LEFT ATRIUM              Index       RIGHT ATRIUM           Index LA diam:        4.05 cm  1.69 cm/m  RA Area:     33.00 cm LA Vol (A2C):   92.8 ml  38.82 ml/m RA Volume:   115.00 ml 48.10 ml/m LA Vol (A4C):   109.0 ml 45.59 ml/m LA Biplane Vol: 101.0 ml 42.25 ml/m  AORTIC VALVE AV Area (Vmax):    4.36 cm AV Area (Vmean):   4.36 cm AV Area (VTI):     4.61  cm AV Vmax:           151.00 cm/s AV Vmean:          97.300 cm/s AV VTI:            0.231 m AV Peak Grad:      9.1 mmHg AV Mean Grad:      4.0 mmHg LVOT Vmax:         115.00 cm/s LVOT Vmean:        74.100 cm/s LVOT VTI:          0.186 m LVOT/AV VTI ratio: 0.81  AORTA Ao Root diam: 3.80 cm MITRAL VALVE                        TRICUSPID VALVE MV Area (PHT): 3.74 cm             TR Peak grad:   14.6 mmHg MV  PHT:        58.87 msec           TR Vmax:        191.00 cm/s MV Decel Time: 203 msec MV E velocity: 83.40 cm/s 103 cm/s  SHUNTS MV A velocity: 92.20 cm/s 70.3 cm/s Systemic VTI:  0.19 m MV E/A ratio:  0.90       1.5       Systemic Diam: 2.70 cm  Skeet Latch MD Electronically signed by Skeet Latch MD Signature Date/Time: 03/27/2019/4:31:15 PM    Final    [place] EKG SITE RITE  Result Date: 03/27/2019 If Site Rite image not attached, placement could not be confirmed due to current cardiac rhythm.   PHYSICAL EXAM    Temp:  [98.2 F (36.8 C)-99.6 F (37.6 C)] 98.2 F (36.8 C) (01/30 0852) Pulse Rate:  [67-75] 67 (01/30 0852) Resp:  [14-19] 18 (01/30 0852) BP: (121-143)/(51-89) 134/73 (01/30 0852) SpO2:  [100 %] 100 % (01/30 0852)  General - Well nourished, well developed, sleepy  Ophthalmologic - fundi not visualized due to noncooperation.  Cardiovascular - Regular rate and rhythm.  Neuro - sleepy, eyes closed but able to follow all simple commands. Orientated to place, age, self, time. Visual field not cooperative, with forced eye opening, PERRL EOMI. Facial symmetrical.  Tongue midline.  Bilateral upper extremity 3+/5,bilateral lower extremity 3/5 proximal and 4/5 distally. DTR 1+ and no babinski. Sensation symmetrical, coordination slow but intact. Gait not tested.   ASSESSMENT/PLAN Mr. Vegas Tringali Handrich is a 69 y.o. 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  He subsequently became diaphoretic, hypotensive and unresponsive and was taken to [facility] where an MRI revealed a stroke. transferred to [facility]. Found to have a 90% L M1 stenosis, large L ACA aneurysm and atrial appendage clot. Underwent L CEA followed by L M1 stent angioplasty and stent assisted coiling L ACA aneurysm. Found to have R hemiparesis following procedure.  Left M1 high grade stenosis  CTA head and neck moderate to severe stenosis left M1 with poststenotic dilatation  IR severe left M1 90% stenosis  On Plavix 1/27, P2Y12 = 255  S/p left M1 stenting 1/27 Deveshwar  Post IR neuro changes - R HP, resolved  CT stable, CTA showed patent stent  Now on brilinta, ASA discontinued - P2Y12 = 21  Left ACA pericallosal segment aneurysm  CTA head and neck 4x7  IR 9.7x7  S/p stent assisted coiling for aneurysm repair 1/27 Deveshwar  CTA head and neck - 1/28 - stable coiling and stenting  Now on brilinta, ASA discontinued  Left ICA proximal stenosis  CT head and neck left ICA proximal 50% stenosis with soft plaque versus mural thrombus  IR 75% left ICA proximal stenosis  Was on Plavix and heparin IV  L CCA sheath placement in OR by VVS due to poor femoral access then to IR frontal operculum stent  S/p L ICA stent 1/27 Deveshwar, then back to OR for sheath removal and CCA closure by VVS -> JP removed today  Post IR neuro changes - R HP, resolved  CT stable and CTA neck patent stent  On brilinta, ASA discontinued  Stroke:  early/ subacute infarct with in the right cerebellar peduncle, likely small vessel disease.   CT  head - OSH - showed 8X38mm focal slightly dense lesion within the left and anterior callosal region   MRI head - OSH - anterior cerebral artery pericallosal aneurysm. Showed an early/ subacute infarct with in the right cerebellum in the right  brachium pontis.  CTA H&N - OSH - 4 x 7 mm aneurysm left pericallosal segment left ACA without evidence of rupture. Moderate to severe stenosis left M1 segment with poststenotic dilatation. Atherosclerotic disease/mural thrombus in the proximal left ICA 50% stenosis.  IR - severe 90% left MCA M1 stenosis.  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  75% left ICA proximal stenosis.  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   LDL - 177  HgbA1c - 6.4  VTE prophylaxis - SCDs and Heparin IV  aspirin 81 mg daily prior to admission, now on eliquis and brilinta  Therapy recommendations:  CIR  Disposition:  Pending  LAA thrombus  TEE - 03/20/2019 OSH - Left atrial appendage thrombus  Tele monitoring to evaluate for afib  Cardiology on board  on heparin IV -> change to eliquis  Recommend long term cardiac monitoring with cardiology to evaluate for afib once off anticoagulation. Follow up with cardiology.   PAD  Pre and post IR procedure, but her lower extremity PAD found with Doppler  Vascular surgery consulted  Dr. Oneida Alar to follow as outpatient  No IR procedure from femoral approach  Hypertension  Home BP meds: none listed  Stable  BP goal 120-140 x 24h following IR procedure.   Long-term BP goal  normotensive  Hyperlipidemia  Home Lipid lowering medication: Lovaza  LDL 177, goal < 70  Current lipid lowering medication: Lipitor 80 mg daily   Continue statin at discharge  Diabetes  Home diabetic meds: none   Current diabetic meds: SSI   HgbA1c 6.4, goal < 7.0  CBG monitoring  Close PCP follow-up  Tobacco abuse  Current smoker  Smoking cessation counseling provided  Pt is willing to quit  Other Stroke Risk Factors  Previous ETOH use  Family hx stroke (mother)  Coronary artery disease  Other Active Problems  Hypokalemia - K 3.4  Acute blood lose anemia 8.5-8.2-8.8-8.2->7.7  Hospital day #  9  Neurology will sign off. Please call with questions. Pt will follow up with stroke clinic Dr. Leonie Man at [facility] in about 4 weeks. Thanks for the consult.  Rosalin Hawking, MD PhD Stroke Neurology 03/29/2019 4:49 PM  To contact Stroke Continuity provider, please refer to [email or web address] After hours, contact General Neurology

## 2019-03-29 NOTE — Progress Notes (Signed)
   Proximal left ICA stenosis and left MCA M1 stenosis s/p revascularization using stent assisted angioplasty along with embolization of left ACA pericallosal aneurysm using stent assisted coiling via left carotid approach 03/26/2019 by Dr. Estanislado Pandy.  Dr Erlinda Hong note 1/29:  aspirin 81 mg dailyprior to admission, was on aspirin 81, brilinta 90 bid and heparin IV - discussed with Dr. Estanislado Pandy, will change heparin IV to eliquis and stop ASA 81, continue brilinta  Chart check today: Dr Maylene Roes: Left MCA stenosis and aneurysm, left ACA pericallosal segment aneurysm, left ICA proximal stenosis  -S/p left common carotid artery angiogram, stent angioplasty left MCA, stent assisted coiling of the left ACA pericallosal aneurysm 03/26/2019 -Continue Eliquis, brilinta  -Follow up with Dr. Estanislado Pandy in 3 weeks after discharge    IR OP orders in place

## 2019-03-29 NOTE — Progress Notes (Signed)
Was given in report that patient had a caude foley removed 1/29 and was due to void. Patient had not voided, was bladder scanned and had 563 ml. MD gave order to in/out, met a lot of resistance and the patient was in pain. Bladder scanned again and patient had 642 ml. The patient has pain at the penis just from touch.   

## 2019-03-30 LAB — BASIC METABOLIC PANEL
Anion gap: 8 (ref 5–15)
BUN: 12 mg/dL (ref 8–23)
CO2: 23 mmol/L (ref 22–32)
Calcium: 8 mg/dL — ABNORMAL LOW (ref 8.9–10.3)
Chloride: 107 mmol/L (ref 98–111)
Creatinine, Ser: 0.97 mg/dL (ref 0.61–1.24)
GFR calc Af Amer: >60 mL/min (ref >60)
GFR calc non Af Amer: >60 mL/min (ref >60)
Glucose, Bld: 112 mg/dL — ABNORMAL HIGH (ref 70–99)
Potassium: 3.4 mmol/L — ABNORMAL LOW (ref 3.5–5.1)
Sodium: 138 mmol/L (ref 135–145)

## 2019-03-30 LAB — CBC
HCT: 22.7 % — ABNORMAL LOW (ref 39.0–52.0)
Hemoglobin: 7.4 g/dL — ABNORMAL LOW (ref 13.0–17.0)
MCH: 31.4 pg (ref 26.0–34.0)
MCHC: 32.6 g/dL (ref 30.0–36.0)
MCV: 96.2 fL (ref 80.0–100.0)
Platelets: 200 10*3/uL (ref 150–400)
RBC: 2.36 MIL/uL — ABNORMAL LOW (ref 4.22–5.81)
RDW: 15.4 % (ref 11.5–15.5)
WBC: 7.6 10*3/uL (ref 4.0–10.5)
nRBC: 0 % (ref 0.0–0.2)

## 2019-03-30 LAB — GLUCOSE, CAPILLARY
Glucose-Capillary: 118 mg/dL — ABNORMAL HIGH (ref 70–99)
Glucose-Capillary: 96 mg/dL (ref 70–99)
Glucose-Capillary: 114 mg/dL — ABNORMAL HIGH (ref 70–99)
Glucose-Capillary: 115 mg/dL — ABNORMAL HIGH (ref 70–99)
Glucose-Capillary: 106 mg/dL — ABNORMAL HIGH (ref 70–99)
Glucose-Capillary: 97 mg/dL (ref 70–99)

## 2019-03-30 MED ORDER — LEVOTHYROXINE SODIUM 100 MCG PO TABS
100.0000 ug | ORAL_TABLET | Freq: Every day | ORAL | Status: DC
  Administered 2019-03-31 – 2019-04-03 (×4): 100 ug via ORAL
  Filled 2019-03-30 (×4): qty 1

## 2019-03-30 MED ORDER — POTASSIUM CHLORIDE CRYS ER 20 MEQ PO TBCR
40.0000 meq | EXTENDED_RELEASE_TABLET | Freq: Once | ORAL | Status: AC
  Administered 2019-03-30: 08:00:00 40 meq via ORAL
  Filled 2019-03-30: qty 2

## 2019-03-30 NOTE — Progress Notes (Signed)
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  He then presented to [facility] on 03/17/2019 for dizziness and syncope.  Work-up as below:   CT brain 03/17/19: 8x33m focal slight dense lesion in the left and anterior callosal region.   MRI brain 03/17/19: Early subacute infarct within the right cerebellum and right brachium pontis. Punctate acute left parietal lobe cortical infarct. [id number] focus of enhancement in the pericallosal region w imaging features most suggestive of ACA pericallosal aneurysm, CTA recommended. Chronic lacunar infarct in the right caudate nucleus  CTA head/neck: 4x7 mm anuerysm left pericallosal segment left ACA without evidence of rupture. Mild stenosis of right M1 segment, Moderate to severe stenosis left M1 segment poststenotic dilation. Mild stenoss in the PCA bilaterally. Atherosclerotic disease, mural thrombus in the prox left ICA narrowing the lumen by 50%. Eccentric atherosclerotic dsiease vs focal dissection in the left ICA below the skull base without significant stenosis  TEE 03/20/19: EF 45-50% , left atrial appendage thrombus, and mild MR  He underwent left common carotid artery angiogram followed by stent angioplasty of left MCM M1 segment. He was admitted to ICU post-procedure, then subsequently extubated and transferred back to hospitalist service 03/29/2019.   New events last 24 hours / Subjective: Ate some breakfast, no new complaints today. Had documented fever 100.4 yesterday.   Assessment & Plan:   [facility] Problems:   [facility]  Carotid stenosis   CAD (coronary artery disease)   Brain aneurysm   Middle cerebral artery stenosis   Aneurysm of anterior cerebral artery   Essential hypertension   Prediabetes   Dysphagia, post-stroke   Thrombus of left atrial appendage   Left MCA stenosis and aneurysm, left ACA pericallosal segment aneurysm, left ICA proximal stenosis  -S/p left common carotid artery angiogram, stent angioplasty left MCA, stent assisted coiling of the left ACA pericallosal aneurysm 03/26/2019 -Continue Eliquis, brilinta  -Follow up with Dr. DeEstanislado Pandyn 3 weeks after discharge  -Follow up with Dr. SeLeonie Mann 4 weeks after discharge   Acute respiratory failure, hypoxemic, post-procedure -Now extubated and remains on room air   Fever -Currently on levaquin for presumed UTI  Left atrial appendage thrombus -Continue Eliquis  -Follow up with cardiology   HLD -Continue lipitor   Hypothyroidism -Continue Synthroid  Acute kidney injury -Baseline Cr ~1 -Resolved  DM type 2, well controlled -Ha1c 6.4 -SSI   Right foot ischemia, PVD -Vascular surgery Dr. FiEden Latheas seen the patient and ordered duplex ultrasound of the right lower extremity with bilateral ABIs.  He has severe PAD, chronic.  Patient is asymptomatic at this time.  No intervention planned in the hospital.  Patient to follow-up as outpatient.  BPH -Previous hospitalist discussed with Dr. BeLouis Meckelwho recommends to continue with Flomax at this time and consider voiding trial once patient is more stable.  If patient is unable to urinate, reinsert catheter and follow-up urology in 2 weeks after discharge. -Coude catheter removed but now having urinary retention again. Replace coude catheter and plan to follow up with urology outpatient.   Hypokalemia -Replace, trend    DVT prophylaxis: Eliquis  Code Status:  Full Family Communication: None at bedside Disposition Plan: Patient is from home prior to admission. Currently  in-hospital treatment needed due to stroke work up and treatment, now medically stable and improved. Barrier(s) to discharge include CIR placement and suspect patient will discharge to CIR vs SNF.    Consultants:   Neurology  Vascular surgery  IR  PCCM   Antimicrobials:  Anti-infectives (From admission, onward)   Start     Dose/Rate Route Frequency Ordered Stop   03/28/19 1200  levofloxacin (LEVAQUIN) tablet 750 mg     750 mg Oral Daily 03/28/19 1026 04/02/19 1159   03/25/19 1215  levofloxacin (LEVAQUIN) IVPB 750 mg  Status:  Discontinued     750 mg 100 mL/hr over 90 Minutes Intravenous Every 24 hours 03/25/19 1206 03/28/19 1026       Objective: Vitals:   03/29/19 1949 03/30/19 0020 03/30/19 0411 03/30/19 0746  BP: (!) 153/66 (!) 164/70 138/65 (!) 146/62  Pulse: 74 74 69 66  Resp: 18 20 18 20   Temp: 99.9 F (37.7 C) 99.4 F (37.4 C) 98.5 F (36.9 C) 98.6 F (37 C)  TempSrc: Oral Oral Oral Oral  SpO2: 100% 100% 100% 100%  Weight:      Height:        Intake/Output Summary (Last 24 hours) at 03/30/2019 1038 Last data filed at 03/30/2019 0800 Gross per 24 hour  Intake 360 ml  Output 2150 ml  Net -1790 ml   Filed Weights   03/20/19 1842  Weight: 106.1 kg    Examination: General exam: Appears calm and comfortable  Respiratory system: Clear to auscultation. Respiratory effort normal. Cardiovascular system: S1 & S2 heard. No pedal edema. Gastrointestinal system: Abdomen is nondistended, soft and nontender. Normal bowel sounds heard. Central nervous system: Alert and oriented. Non focal exam. Speech clear  Extremities: Symmetric in appearance bilaterally  Skin: No rashes, lesions or ulcers on exposed skin  Psychiatry: Judgement and insight appear stable. Mood & affect appropriate.    Data Reviewed: I have personally reviewed following labs and imaging studies  CBC: Recent Labs  Lab 03/26/19 0444 03/26/19 0737 03/26/19 1648 03/27/19 0537 03/28/19 0633  03/29/19 0528 03/30/19 0640  WBC 4.7  --   --  6.3 7.1 6.6 7.6  NEUTROABS  --   --   --  4.8  --   --   --   HGB 9.6*   < > 8.2* 8.8* 8.2* 7.7* 7.4*  HCT 28.7*   < > 24.0* 25.8* 24.9* 23.9* 22.7*  MCV 94.4  --   --  93.5 94.7 97.6 96.2  PLT 195  --   --  173 200 190 200   < > = values in this interval not displayed.   Basic Metabolic Panel: Recent Labs  Lab 03/26/19 0444 03/26/19 0737 03/26/19 1250 03/26/19 1406 03/26/19 1648 03/27/19 0537 03/28/19 0633 03/29/19 0528 03/30/19 0640  NA 134*   < > 136   < > 136 137 138 139 138  K 3.4*   < > 3.9   < > 3.8 3.8 3.2* 3.4* 3.4*  CL 103   < > 104  --   --  109 110 108 107  CO2 22  --   --   --   --  20* 20* 22 23  GLUCOSE 105*   < > 136*  --   --  121* 85 117* 112*  BUN 11   < > 10  --   --  9 11 13 12   CREATININE 1.19   < > 1.00  --   --  0.96 1.36* 1.10 0.97  CALCIUM 8.4*  --   --   --   --  7.8* 7.8* 7.9* 8.0*  MG  --   --   --   --   --   --  1.7 2.3  --   PHOS  --   --   --   --   --   --  2.9 2.6  --    < > = values in this interval not displayed.   GFR: Estimated Creatinine Clearance: 89.3 mL/min (by C-G formula based on SCr of 0.97 mg/dL). Liver Function Tests: Recent Labs  Lab 03/28/19 0633 03/28/19 1553  AST 26 34  ALT 17 19  ALKPHOS 71 89  BILITOT 0.7 0.6  PROT 5.6* 6.1*  ALBUMIN 2.3* 2.3*   No results for input(s): LIPASE, AMYLASE in the last 168 hours. No results for input(s): AMMONIA in the last 168 hours. Coagulation Profile: Recent Labs  Lab 03/29/19 0528  INR 1.4*   Cardiac Enzymes: No results for input(s): CKTOTAL, CKMB, CKMBINDEX, TROPONINI in the last 168 hours. BNP (last 3 results) No results for input(s): PROBNP in the last 8760 hours. HbA1C: No results for input(s): HGBA1C in the last 72 hours. CBG: Recent Labs  Lab 03/29/19 1541 03/29/19 2006 03/29/19 2334 03/30/19 0410 03/30/19 0643  GLUCAP 103* 108* 110* 118* 96   Lipid Profile: Recent Labs    03/28/19 0633  TRIG 186*    Thyroid Function Tests: No results for input(s): TSH, T4TOTAL, FREET4, T3FREE, THYROIDAB in the last 72 hours. Anemia Panel: No results for input(s): VITAMINB12, FOLATE, FERRITIN, TIBC, IRON, RETICCTPCT in the last 72 hours. Sepsis Labs: No results for input(s): PROCALCITON, LATICACIDVEN in the last 168 hours.  Recent Results (from the past 240 hour(s))  SARS CORONAVIRUS 2 (TAT 6-24 HRS) Nasopharyngeal Nasopharyngeal Swab     Status: None   Collection Time: 03/24/19  7:29 PM   Specimen: Nasopharyngeal Swab  Result Value Ref Range Status   SARS Coronavirus 2 NEGATIVE NEGATIVE Final    Comment: (NOTE) SARS-CoV-2 target nucleic acids are NOT DETECTED. The SARS-CoV-2 RNA is generally detectable in upper and lower respiratory specimens during the acute phase of infection. Negative results do not preclude SARS-CoV-2 infection, do not rule out co-infections with other pathogens, and should not be used as the sole basis for treatment or other patient management decisions. Negative results must be combined with clinical observations, patient history, and epidemiological information. The expected result is Negative. Fact Sheet for Patients: SugarRoll.be Fact Sheet for Healthcare Providers: https://www.woods-mathews.com/ This test is not yet approved or cleared by the [place] FDA and  has been authorized for detection and/or diagnosis of SARS-CoV-2 by FDA under an Emergency Use Authorization (EUA). This EUA will remain  in effect (meaning this test can be used) for the duration of the COVID-19 declaration under Section 56 4(b)(1) of the Act, 21 U.S.C. section 360bbb-3(b)(1), unless the authorization is terminated or revoked sooner. Performed at [facility], [place] [address] [place], [place] [address]   Surgical PCR screen     Status: None   Collection Time: 03/26/19  2:15 AM   Specimen: Nasal Mucosa; Nasal Swab  Result Value Ref Range  Status   MRSA, PCR NEGATIVE NEGATIVE Final   Staphylococcus aureus NEGATIVE NEGATIVE Final    Comment: (NOTE) The Xpert SA Assay (FDA approved for  NASAL specimens in patients 44 years of age and older), is one component of a comprehensive surveillance program. It is not intended to diagnose infection nor to guide or monitor treatment. Performed at [facility], [place] [address] [place], [place] [address]   Culture, Urine     Status: None   Collection Time: 03/28/19 10:28 AM   Specimen: Urine, Random  Result Value Ref Range Status   Specimen Description URINE, RANDOM  Final   Special Requests NONE  Final   Culture   Final    NO GROWTH Performed at [facility], [place] [address], [place], [place] [address]    Report Status 03/29/2019 FINAL  Final      Radiology Studies: No results found.    Scheduled Meds: . apixaban  5 mg Oral BID  . atorvastatin  80 mg Oral q1800  . chlorhexidine gluconate (MEDLINE KIT)  15 mL Mouth Rinse BID  . Chlorhexidine Gluconate Cloth  6 each Topical Daily  . insulin aspart  0-9 Units Subcutaneous TID WC  . levofloxacin  750 mg Oral Daily  . [START ON 03/31/2019] levothyroxine  100 mcg Oral Q0600  . pantoprazole  40 mg Oral QHS  . sodium chloride flush  10-40 mL Intracatheter Q12H  . sodium chloride flush  10-40 mL Intracatheter Q12H  . tamsulosin  0.4 mg Oral Daily  . ticagrelor  90 mg Oral BID   Continuous Infusions: . sodium chloride Stopped (03/26/19 1727)     LOS: 10 days      Time spent: 20 minutes   Dessa Phi, DO [facility] 03/30/2019, 10:38 AM   Available via Epic secure chat 7am-7pm After these hours, please refer to coverage provider listed on [email or web address]

## 2019-03-31 LAB — BASIC METABOLIC PANEL
Anion gap: 12 (ref 5–15)
BUN: 10 mg/dL (ref 8–23)
CO2: 23 mmol/L (ref 22–32)
Calcium: 8.5 mg/dL — ABNORMAL LOW (ref 8.9–10.3)
Chloride: 101 mmol/L (ref 98–111)
Creatinine, Ser: 0.93 mg/dL (ref 0.61–1.24)
GFR calc Af Amer: >60 mL/min (ref >60)
GFR calc non Af Amer: >60 mL/min (ref >60)
Glucose, Bld: 100 mg/dL — ABNORMAL HIGH (ref 70–99)
Potassium: 3.5 mmol/L (ref 3.5–5.1)
Sodium: 136 mmol/L (ref 135–145)

## 2019-03-31 LAB — CBC
HCT: 23.7 % — ABNORMAL LOW (ref 39.0–52.0)
Hemoglobin: 7.9 g/dL — ABNORMAL LOW (ref 13.0–17.0)
MCH: 31.2 pg (ref 26.0–34.0)
MCHC: 33.3 g/dL (ref 30.0–36.0)
MCV: 93.7 fL (ref 80.0–100.0)
Platelets: 244 10*3/uL (ref 150–400)
RBC: 2.53 MIL/uL — ABNORMAL LOW (ref 4.22–5.81)
RDW: 15 % (ref 11.5–15.5)
WBC: 8.6 10*3/uL (ref 4.0–10.5)
nRBC: 0 % (ref 0.0–0.2)

## 2019-03-31 LAB — GLUCOSE, CAPILLARY
Glucose-Capillary: 94 mg/dL (ref 70–99)
Glucose-Capillary: 97 mg/dL (ref 70–99)
Glucose-Capillary: 102 mg/dL — ABNORMAL HIGH (ref 70–99)
Glucose-Capillary: 103 mg/dL — ABNORMAL HIGH (ref 70–99)

## 2019-03-31 LAB — MAGNESIUM: Magnesium: 2 mg/dL (ref 1.7–2.4)

## 2019-03-31 NOTE — Progress Notes (Signed)
Inpatient Rehabilitation Admissions Coordinator  I met with patient at bedside and spoke with his wife by phone. We dicussed goals and expectations of an inpt rehab admit. They are in agreement. I will begin insurance authorization with [employer] for a possible admit; I will follow up tomorrow.  Danne Baxter, RN, MSN Rehab Admissions Coordinator [phone] 03/31/2019 11:47 AM

## 2019-03-31 NOTE — Progress Notes (Signed)
Physical Therapy Treatment Patient Details Name: Jeremy Sherman MRN: [id number] DOB: 1950-11-13 Today's Date: 03/31/2019    History of Present Illness 69 year old male with a history of hypothyroidism, hypertension, hyperlipidemia, prediabetes, CAD s/p CABG 07/02/2007, who comes in from outside hospital with R cerebellar infarct and L parietal infarct. s/p arteriogram which additionally reveals L ACA pericallosal aneurysm. Pt now s/p L common carotid arteriogram with angioplasty of L MCA M1 segment, L ACA pericallosal aneurysm coiling, and L ICA stenting. Extubated 03/27/19.    PT Comments    Pt with marked improvement from most recent PT eval but not to the level that he was on initial PT eval. Required min A for bed mobility. Mod A +2 for sit<>stand from chair numerous times and to ambulate 7' with RW. Pt with excessive trunk flexion in standing. Worked on this at sink with visual and tactile feedback with some improvement. Pt with delayed processing, decreased attention, and STM deficits but more alert and engaged during this session. Continue to recommend CIR level therapies after acute course. PT will continue to follow.    Follow Up Recommendations  CIR;Supervision/Assistance - 24 hour     Equipment Recommendations  None recommended by PT    Recommendations for Other Services       Precautions / Restrictions Precautions Precautions: Fall Restrictions Weight Bearing Restrictions: No    Mobility  Bed Mobility Overal bed mobility: Needs Assistance Bed Mobility: Supine to Sit     Supine to sit: Min assist     General bed mobility comments: pt attempted to sit straight up in bed and was unable, vc's for rolling over and pt was able to complete task and move LE's off bed. Min HHA for elevation of trunk into sitting and vc's needed to scoot to EOB. Pt needed redirecting to this task several times due to decreased attention  Transfers Overall transfer level: Needs  assistance Equipment used: Rolling walker (2 wheeled) Transfers: Sit to/from Stand Sit to Stand: +2 physical assistance;+2 safety/equipment;Mod assist         General transfer comment: max A +2 for first sit<>stand from bed but mod A +2 for for 3 subsequent sit<>stand from recliner. Cues for full extension of trunk as pt tends to keep wt posterior in heels and trunk flexed at hips.   Ambulation/Gait Ambulation/Gait assistance: Mod assist;+2 safety/equipment Gait Distance (Feet): 7 Feet Assistive device: Rolling walker (2 wheeled) Gait Pattern/deviations: Wide base of support;Decreased stride length;Trunk flexed;Step-to pattern Gait velocity: decr Gait velocity interpretation: <1.31 ft/sec, indicative of household ambulator General Gait Details: pt with very short step length and kept trying to push RW further fwd without moving feet. Mod A and mod vc's for safe use of RW and each step.    Stairs             Wheelchair Mobility    Modified Rankin (Stroke Patients Only) Modified Rankin (Stroke Patients Only) Pre-Morbid Rankin Score: No symptoms Modified Rankin: Moderately severe disability     Balance Overall balance assessment: Needs assistance Sitting-balance support: Feet supported;No upper extremity supported Sitting balance-Leahy Scale: Fair Sitting balance - Comments: able to sit EOB and perform LE ther ex without LOB   Standing balance support: Bilateral upper extremity supported;During functional activity Standing balance-Leahy Scale: Poor Standing balance comment: heavily reliant on RW with trunk flex almost to 90 degrees at times  Cognition Arousal/Alertness: Lethargic Behavior During Therapy: Flat affect Overall Cognitive Status: Impaired/Different from baseline Area of Impairment: Safety/judgement;Problem solving;Awareness;Following commands;Orientation;Memory;Attention                 Orientation Level:  Time Current Attention Level: Sustained Memory: Decreased short-term memory;Decreased recall of precautions Following Commands: Follows one step commands consistently;Follows one step commands with increased time Safety/Judgement: Decreased awareness of safety Awareness: Intellectual Problem Solving: Difficulty sequencing;Requires verbal cues;Slow processing;Requires tactile cues General Comments: pt with delayed processing and thought it was 1991 but once alert was appropriate throughout session even responding to humor      Exercises General Exercises - Lower Extremity Ankle Circles/Pumps: AROM;Both;10 reps;Seated Long Arc Quad: Both;AAROM;Seated;10 reps Hip Flexion/Marching: AROM;Both;10 reps;Seated    General Comments General comments (skin integrity, edema, etc.): worked at sink to increase visual feedback of posture. Pt seems tight in hip flexors but also with decreased awareness of body position and quick fatigue all contributing to poor posture. HR 86-90 bpm, SpO2 97% on RA.       Pertinent Vitals/Pain Pain Assessment: No/denies pain Pain Location: denies pain but frequently rubs L knee    Home Living                      Prior Function            PT Goals (current goals can now be found in the care plan section) Acute Rehab PT Goals Patient Stated Goal: get stronger PT Goal Formulation: With patient Time For Goal Achievement: 04/05/19 Potential to Achieve Goals: Good Progress towards PT goals: Progressing toward goals    Frequency    Min 4X/week      PT Plan Current plan remains appropriate    Co-evaluation PT/OT/SLP Co-Evaluation/Treatment: Yes Reason for Co-Treatment: Complexity of the patient's impairments (multi-system involvement);Necessary to address cognition/behavior during functional activity;For patient/therapist safety          AM-PAC PT "6 Clicks" Mobility   Outcome Measure  Help needed turning from your back to your side while  in a flat bed without using bedrails?: A Lot Help needed moving from lying on your back to sitting on the side of a flat bed without using bedrails?: A Lot Help needed moving to and from a bed to a chair (including a wheelchair)?: A Lot Help needed standing up from a chair using your arms (e.g., wheelchair or bedside chair)?: A Lot Help needed to walk in hospital room?: A Lot Help needed climbing 3-5 steps with a railing? : Total 6 Click Score: 11    End of Session Equipment Utilized During Treatment: Gait belt Activity Tolerance: Patient limited by fatigue Patient left: with call bell/phone within reach;in chair;with chair alarm set(not up to chair due to pt transferring units) Nurse Communication: Mobility status PT Visit Diagnosis: Other abnormalities of gait and mobility (R26.89);Unsteadiness on feet (R26.81)     Time: [id number] PT Time Calculation (min) (ACUTE ONLY): 30 min  Charges:  $Gait Training: 8-22 mins                     Leighton Roach, [place]  Pager [phone] Office [place] 03/31/2019, 11:55 AM

## 2019-03-31 NOTE — Plan of Care (Signed)
  Problem: Education: Goal: Knowledge of General Education information will improve Description: Including pain rating scale, medication(s)/side effects and non-pharmacologic comfort measures Outcome: Progressing   Problem: Safety: Goal: Ability to remain free from injury will improve Outcome: Progressing   Problem: Education: Goal: Knowledge of disease or condition will improve Outcome: Progressing Goal: Knowledge of secondary prevention will improve Outcome: Progressing   Problem: Ischemic Stroke/TIA Tissue Perfusion: Goal: Complications of ischemic stroke/TIA will be minimized Outcome: Progressing

## 2019-03-31 NOTE — Progress Notes (Signed)
PROGRESS NOTE    Jeremy Sherman  MRN:[id number] DOB: 04/28/1950 DOA: 03/20/2019 PCP: York, Regina F, NP     Brief Narrative:  Jeremy Sherman is a 68-year-old male with past medical history significant for hypothyroidism, hypertension, hyperlipidemia, prediabetes, CAD status post CABG May 2009 who was recently admitted at [facility] from 03/11/2019 to 03/13/2019 for dizziness thought to be secondary to hypertensive urgency.  He then presented to [facility] on 03/17/2019 for dizziness and syncope.  Work-up as below:   CT brain 03/17/19: 8x6mm focal slight dense lesion in the left and anterior callosal region.   MRI brain 03/17/19: Early subacute infarct within the right cerebellum and right brachium pontis. Punctate acute left parietal lobe cortical infarct. 9x7mm lobular focus of enhancement in the pericallosal region w imaging features most suggestive of ACA pericallosal aneurysm, CTA recommended. Chronic lacunar infarct in the right caudate nucleus  CTA head/neck: 4x7 mm anuerysm left pericallosal segment left ACA without evidence of rupture. Mild stenosis of right M1 segment, Moderate to severe stenosis left M1 segment poststenotic dilation. Mild stenoss in the PCA bilaterally. Atherosclerotic disease, mural thrombus in the prox left ICA narrowing the lumen by 50%. Eccentric atherosclerotic dsiease vs focal dissection in the left ICA below the skull base without significant stenosis  TEE 03/20/19: EF 45-50% , left atrial appendage thrombus, and mild MR  He underwent left common carotid artery angiogram followed by stent angioplasty of left MCM M1 segment. He was admitted to ICU post-procedure, then subsequently extubated and transferred back to hospitalist service 03/29/2019.   New events last 24 hours / Subjective: Eating breakfast this morning in bed, no new complaints.  Remained afebrile.  Assessment & Plan:   Active Problems:   Stroke (HCC)  Carotid stenosis   CAD (coronary artery disease)   Brain aneurysm   Middle cerebral artery stenosis   Aneurysm of anterior cerebral artery   Essential hypertension   Prediabetes   Dysphagia, post-stroke   Thrombus of left atrial appendage   Left MCA stenosis and aneurysm, left ACA pericallosal segment aneurysm, left ICA proximal stenosis  -S/p left common carotid artery angiogram, stent angioplasty left MCA, stent assisted coiling of the left ACA pericallosal aneurysm 03/26/2019 -Continue Eliquis, brilinta  -Follow up with Dr. Deveshwar in 3 weeks after discharge  -Follow up with Dr. Sethi in 4 weeks after discharge   Acute respiratory failure, hypoxemic, post-procedure -Now extubated and remains on room air   Fever -Currently on levaquin for presumed UTI -Afebrile last 24 hours  Left atrial appendage thrombus -Continue Eliquis  -Follow up with cardiology   HLD -Continue lipitor   Hypothyroidism -Continue Synthroid  Acute kidney injury -Baseline Cr ~1 -Resolved  DM type 2, well controlled -Ha1c 6.4 -SSI   Right foot ischemia, PVD -Vascular surgery Dr. Field has seen the patient and ordered duplex ultrasound of the right lower extremity with bilateral ABIs.  He has severe PAD, chronic.  Patient is asymptomatic at this time.  No intervention planned in the hospital.  Patient to follow-up as outpatient.  BPH -Previous hospitalist discussed with Dr. Benjamin Herrick, who recommends to continue with Flomax at this time and consider voiding trial once patient is more stable.  If patient is unable to urinate, reinsert catheter and follow-up urology in 2 weeks after discharge. -Coude catheter removed but now having urinary retention again. Replace coude catheter and plan to follow up with urology outpatient.     DVT prophylaxis: Eliquis  Code Status: Full Family   Communication: None at bedside Disposition Plan: Patient is from home prior to admission. Currently in-hospital  treatment needed due to stroke work up and treatment, now medically stable and improved. Barrier(s) to discharge include CIR placement and suspect patient will discharge to CIR pending insurance auth   Consultants:   Neurology  Vascular surgery  IR  PCCM   Antimicrobials:  Anti-infectives (From admission, onward)   Start     Dose/Rate Route Frequency Ordered Stop   03/28/19 1200  levofloxacin (LEVAQUIN) tablet 750 mg     750 mg Oral Daily 03/28/19 1026 04/02/19 1159   03/25/19 1215  levofloxacin (LEVAQUIN) IVPB 750 mg  Status:  Discontinued     750 mg 100 mL/hr over 90 Minutes Intravenous Every 24 hours 03/25/19 1206 03/28/19 1026       Objective: Vitals:   03/31/19 0200 03/31/19 0228 03/31/19 0325 03/31/19 0931  BP:  (!) 160/72 (!) 154/93 (!) 152/68  Pulse:  75 75 76  Resp:  20 (!) 22 16  Temp: 99.4 F (37.4 C) 98.2 F (36.8 C) 99.8 F (37.7 C) 98.2 F (36.8 C)  TempSrc: Oral Oral Oral Oral  SpO2:  100% 99% 97%  Weight:      Height:        Intake/Output Summary (Last 24 hours) at 03/31/2019 0945 Last data filed at 03/31/2019 0820 Gross per 24 hour  Intake 100 ml  Output 3250 ml  Net -3150 ml   Filed Weights   03/20/19 1842  Weight: 106.1 kg    Examination: General exam: Appears calm and comfortable  Respiratory system: Clear to auscultation. Respiratory effort normal. Cardiovascular system: S1 & S2 heard, RRR. No pedal edema. Gastrointestinal system: Abdomen is nondistended, soft and nontender. Normal bowel sounds heard. Central nervous system: Alert and oriented.  Speech clear  Extremities: Symmetric in appearance bilaterally  Skin: No rashes, lesions or ulcers on exposed skin  Psychiatry: Judgement and insight appear stable. Mood & affect appropriate.    Data Reviewed: I have personally reviewed following labs and imaging studies  CBC: Recent Labs  Lab 03/27/19 0537 03/28/19 9518 03/29/19 0528 03/30/19 0640 03/31/19 0619  WBC 6.3 7.1 6.6 7.6  8.6  NEUTROABS 4.8  --   --   --   --   HGB 8.8* 8.2* 7.7* 7.4* 7.9*  HCT 25.8* 24.9* 23.9* 22.7* 23.7*  MCV 93.5 94.7 97.6 96.2 93.7  PLT 173 200 190 200 841   Basic Metabolic Panel: Recent Labs  Lab 03/27/19 0537 03/28/19 0633 03/29/19 0528 03/30/19 0640 03/31/19 0619  NA 137 138 139 138 136  K 3.8 3.2* 3.4* 3.4* 3.5  CL 109 110 108 107 101  CO2 20* 20* _0 GLUCOSE 121* 85 117* 112* 100*  BUN _1 CREATININE 0.96 1.36* 1.10 0.97 0.93  CALCIUM 7.8* 7.8* 7.9* 8.0* 8.5*  MG  --  1.7 2.3  --  2.0  PHOS  --  2.9 2.6  --   --    GFR: Estimated Creatinine Clearance: 93.1 mL/min (by C-G formula based on SCr of 0.93 mg/dL). Liver Function Tests: Recent Labs  Lab 03/28/19 0633 03/28/19 1553  AST 26 34  ALT 17 19  ALKPHOS 71 89  BILITOT 0.7 0.6  PROT 5.6* 6.1*  ALBUMIN 2.3* 2.3*   No results for input(s): LIPASE, AMYLASE in the last 168 hours. No results for input(s): AMMONIA in the last 168 hours. Coagulation Profile: Recent Labs  Lab 03/29/19  0528  INR 1.4*   Cardiac Enzymes: No results for input(s): CKTOTAL, CKMB, CKMBINDEX, TROPONINI in the last 168 hours. BNP (last 3 results) No results for input(s): PROBNP in the last 8760 hours. HbA1C: No results for input(s): HGBA1C in the last 72 hours. CBG: Recent Labs  Lab 03/30/19 1200 03/30/19 1553 03/30/19 1632 03/30/19 2115 03/31/19 0604  GLUCAP 114* 115* 106* 97 94   Lipid Profile: No results for input(s): CHOL, HDL, LDLCALC, TRIG, CHOLHDL, LDLDIRECT in the last 72 hours. Thyroid Function Tests: No results for input(s): TSH, T4TOTAL, FREET4, T3FREE, THYROIDAB in the last 72 hours. Anemia Panel: No results for input(s): VITAMINB12, FOLATE, FERRITIN, TIBC, IRON, RETICCTPCT in the last 72 hours. Sepsis Labs: No results for input(s): PROCALCITON, LATICACIDVEN in the last 168 hours.  Recent Results (from the past 240 hour(s))  SARS CORONAVIRUS 2 (TAT 6-24 HRS) Nasopharyngeal Nasopharyngeal  Swab     Status: None   Collection Time: 03/24/19  7:29 PM   Specimen: Nasopharyngeal Swab  Result Value Ref Range Status   SARS Coronavirus 2 NEGATIVE NEGATIVE Final    Comment: (NOTE) SARS-CoV-2 target nucleic acids are NOT DETECTED. The SARS-CoV-2 RNA is generally detectable in upper and lower respiratory specimens during the acute phase of infection. Negative results do not preclude SARS-CoV-2 infection, do not rule out co-infections with other pathogens, and should not be used as the sole basis for treatment or other patient management decisions. Negative results must be combined with clinical observations, patient history, and epidemiological information. The expected result is Negative. Fact Sheet for Patients: SugarRoll.be Fact Sheet for Healthcare Providers: https://www.woods-mathews.com/ This test is not yet approved or cleared by the [place] FDA and  has been authorized for detection and/or diagnosis of SARS-CoV-2 by FDA under an Emergency Use Authorization (EUA). This EUA will remain  in effect (meaning this test can be used) for the duration of the COVID-19 declaration under Section 56 4(b)(1) of the Act, 21 U.S.C. section 360bbb-3(b)(1), unless the authorization is terminated or revoked sooner. Performed at [facility], [place] [address], [place], [place] [address]   Surgical PCR screen     Status: None   Collection Time: 03/26/19  2:15 AM   Specimen: Nasal Mucosa; Nasal Swab  Result Value Ref Range Status   MRSA, PCR NEGATIVE NEGATIVE Final   Staphylococcus aureus NEGATIVE NEGATIVE Final    Comment: (NOTE) The Xpert SA Assay (FDA approved for NASAL specimens in patients 89 years of age and older), is one component of a comprehensive surveillance program. It is not intended to diagnose infection nor to guide or monitor treatment. Performed at [facility], [place] [address], [place], [place] [address]     Culture, Urine     Status: None   Collection Time: 03/28/19 10:28 AM   Specimen: Urine, Random  Result Value Ref Range Status   Specimen Description URINE, RANDOM  Final   Special Requests NONE  Final   Culture   Final    NO GROWTH Performed at [facility], [place] [address] [place], [place] [address]    Report Status 03/29/2019 FINAL  Final      Radiology Studies: No results found.    Scheduled Meds: . apixaban  5 mg Oral BID  . atorvastatin  80 mg Oral q1800  . chlorhexidine gluconate (MEDLINE KIT)  15 mL Mouth Rinse BID  . Chlorhexidine Gluconate Cloth  6 each Topical Daily  . insulin aspart  0-9 Units Subcutaneous  TID WC  . levofloxacin  750 mg Oral Daily  . levothyroxine  100 mcg Oral Q0600  . pantoprazole  40 mg Oral QHS  . sodium chloride flush  10-40 mL Intracatheter Q12H  . sodium chloride flush  10-40 mL Intracatheter Q12H  . tamsulosin  0.4 mg Oral Daily  . ticagrelor  90 mg Oral BID   Continuous Infusions: . sodium chloride Stopped (03/26/19 1727)     LOS: 11 days      Time spent: 20 minutes   Jennifer Choi, DO [facility] 03/31/2019, 9:45 AM   Available via Epic secure chat 7am-7pm After these hours, please refer to coverage provider listed on [email or web address]  

## 2019-03-31 NOTE — Progress Notes (Signed)
Occupational Therapy Treatment Patient Details Name: Jeremy Sherman MRN: [id number] DOB: 1950-11-15 Today's Date: 03/31/2019    History of present illness 69 year old male with a history of hypothyroidism, hypertension, hyperlipidemia, prediabetes, CAD s/p CABG 07/02/2007, who comes in from outside hospital with R cerebellar infarct and L parietal infarct. s/p arteriogram which additionally reveals L ACA pericallosal aneurysm. Pt now s/p L common carotid arteriogram with angioplasty of L MCA M1 segment, L ACA pericallosal aneurysm coiling, and L ICA stenting. Extubated 03/27/19.   OT comments  Pt reports fatigue at OT/PT arrival, but he was agreeable to therapy session. He demonstrates decreased safety awareness and decreased body awareness requiring consistent cues for safe hand placement, cues for sequencing during ambulation at RW level, and multimodal cues for upright posture while standing in front of mirror at sink level. He required modA+2 for functional mobility at RW level, he completed oral care prep while sitting and brushed his teeth while standing requiring modA for stability. Pt will continue to benefit from skilled OT services to maximize safety and independence with ADL/IADL and functional mobility. Will continue to follow acutely and progress as tolerated.    Follow Up Recommendations  CIR;Supervision/Assistance - 24 hour    Equipment Recommendations  Tub/shower bench    Recommendations for Other Services      Precautions / Restrictions Precautions Precautions: Fall Restrictions Weight Bearing Restrictions: No       Mobility Bed Mobility Overal bed mobility: Needs Assistance Bed Mobility: Supine to Sit     Supine to sit: Min assist     General bed mobility comments: pt attempted to sit straight up in bed and was unable, vc's for rolling over and pt was able to complete task and move LE's off bed. Min HHA for elevation of trunk into sitting and vc's needed  to scoot to EOB. Pt needed redirecting to this task several times due to decreased attention  Transfers Overall transfer level: Needs assistance Equipment used: Rolling walker (2 wheeled) Transfers: Sit to/from Stand Sit to Stand: +2 physical assistance;+2 safety/equipment;Mod assist         General transfer comment: max A +2 for first sit<>stand from bed but mod A +2 for for 3 subsequent sit<>stand from recliner. Cues for full extension of trunk as pt tends to keep wt posterior in heels and trunk flexed at hips.     Balance Overall balance assessment: Needs assistance Sitting-balance support: Feet supported;No upper extremity supported Sitting balance-Leahy Scale: Fair Sitting balance - Comments: able to sit EOB and perform LE ther ex without LOB   Standing balance support: Bilateral upper extremity supported;During functional activity;Single extremity supported Standing balance-Leahy Scale: Poor Standing balance comment: heavily reliant on RW with trunk flex almost to 90 degrees at times                           ADL either performed or assessed with clinical judgement   ADL Overall ADL's : Needs assistance/impaired Eating/Feeding: Set up;Sitting   Grooming: Sitting;Moderate assistance;Standing;Min guard Grooming Details (indicate cue type and reason): pt prepped toothbrush while sitting in recliner, brushed teeth while standing at the sink with modA for stability in standing                 Toilet Transfer: Maximal assistance;Moderate assistance;+2 for physical assistance;+2 for safety/equipment;RW;Ambulation Toilet Transfer Details (indicate cue type and reason): ambulated to sink with maxA+2 for sit<>stand and modA for ambulation at RW level with  assist for chair follow Bradley and Hygiene: Maximal assistance;+2 for physical assistance;+2 for safety/equipment Toileting - Clothing Manipulation Details (indicate cue type and reason):  maxA for sit<>stand     Functional mobility during ADLs: Moderate assistance;Maximal assistance;+2 for physical assistance;+2 for safety/equipment;Rolling walker;Cueing for safety;Cueing for sequencing General ADL Comments: ambulated from EOB to sink with need for chair follow;pt uanble to progress into full upright posture even at sink level with use of mirror for visual assistance and tactile input from therapists     Vision       Perception     Praxis      Cognition Arousal/Alertness: Lethargic Behavior During Therapy: Flat affect Overall Cognitive Status: Impaired/Different from baseline Area of Impairment: Safety/judgement;Problem solving;Awareness;Following commands;Orientation;Memory;Attention                 Orientation Level: Time Current Attention Level: Sustained Memory: Decreased short-term memory;Decreased recall of precautions Following Commands: Follows one step commands consistently;Follows one step commands with increased time Safety/Judgement: Decreased awareness of safety Awareness: Intellectual Problem Solving: Difficulty sequencing;Requires verbal cues;Slow processing;Requires tactile cues General Comments: pt with delayed processing and thought it was 1991 but once alert was appropriate throughout session even responding to humor;consistent cues for safety;appears to have decreased awareness of deficits         Exercises Exercises: General Lower Extremity General Exercises - Lower Extremity Ankle Circles/Pumps: AROM;Both;10 reps;Seated Long Arc Quad: Both;AAROM;Seated;10 reps Hip Flexion/Marching: AROM;Both;10 reps;Seated   Shoulder Instructions       General Comments SpO2 97% RA;HR 86-90bpm     Pertinent Vitals/ Pain       Pain Assessment: No/denies pain Pain Location: denies pain but frequently rubs L knee Pain Intervention(s): Limited activity within patient's tolerance;Monitored during session  Home Living                                           Prior Functioning/Environment              Frequency  Min 2X/week        Progress Toward Goals  OT Goals(current goals can now be found in the care plan section)  Progress towards OT goals: Progressing toward goals  Acute Rehab OT Goals Patient Stated Goal: get stronger OT Goal Formulation: With patient Time For Goal Achievement: 04/05/19 Potential to Achieve Goals: Good ADL Goals Pt Will Perform Lower Body Bathing: with min assist;sit to/from stand Pt Will Perform Lower Body Dressing: with min assist;sitting/lateral leans;sit to/from stand Pt Will Transfer to Toilet: with min assist;stand pivot transfer;bedside commode Pt Will Perform Tub/Shower Transfer: with modified independence;3 in 1;ambulating Additional ADL Goal #1: Pt will increase to emergent awareness for safe engagement in BADL strategies Additional ADL Goal #2: Pt will complete multistep cognitive task with <3 errors for safe progression of IADLs in the home  Plan Discharge plan remains appropriate    Co-evaluation    PT/OT/SLP Co-Evaluation/Treatment: Yes Reason for Co-Treatment: Complexity of the patient's impairments (multi-system involvement);For patient/therapist safety;To address functional/ADL transfers;Necessary to address cognition/behavior during functional activity   OT goals addressed during session: ADL's and self-care      AM-PAC OT "6 Clicks" Daily Activity     Outcome Measure   Help from another person eating meals?: A Little Help from another person taking care of personal grooming?: A Little Help from another person toileting, which includes using toliet, bedpan, or  urinal?: A Lot Help from another person bathing (including washing, rinsing, drying)?: A Lot Help from another person to put on and taking off regular upper body clothing?: A Lot Help from another person to put on and taking off regular lower body clothing?: A Lot 6 Click Score: 14     End of Session Equipment Utilized During Treatment: Gait belt;Rolling walker  OT Visit Diagnosis: Unsteadiness on feet (R26.81);Other abnormalities of gait and mobility (R26.89);Muscle weakness (generalized) (M62.81);Other symptoms and signs involving cognitive function   Activity Tolerance Patient tolerated treatment well   Patient Left in chair;with call bell/phone within reach;with chair alarm set   Nurse Communication Mobility status        Time: 1031-1101 OT Time Calculation (min): 30 min  Charges: OT General Charges $OT Visit: 1 Visit OT Treatments $Self Care/Home Management : 8-22 mins  Dorinda Hill OTR/L Acute Rehabilitation Services Office: [place] 03/31/2019, 1:12 PM

## 2019-03-31 NOTE — Progress Notes (Signed)
  Speech Language Pathology Treatment: Dysphagia;Cognitive-Linquistic  Patient Details Name: Jeremy Sherman MRN: [id number] DOB: Jun 27, 1950 Today's Date: 03/31/2019 Time: [id number] SLP Time Calculation (min) (ACUTE ONLY): 21 min  Assessment / Plan / Recommendation Clinical Impression  Patient received sitting upright in chair in room. Patient alert, becoming lethargic as session continued. Pt oriented to self, place, month, but not year (he stated "1951" and later "1971"). ST provided orientation facts verbally, pt did not demonstrate carryover of information. When asked [age], patient stated "45, no, 49. No, 69." Patient able to follow basic verbal commands. He demonstrated distractibility, but was responsive to reduced environmental stimuli and mild verbal cues to attend. Patient with some reduced insight into memory deficits, but responsive to education. ST provided education re: strategies for recall including repetition, visualization and the use of routines. Patient reports he "writes things down" at home, but unable to provide further info when prompted. Unclear if this is accurate. Reading informally assessed, patient able to read short sentences and demonstrated understanding of call bell related signage in room. Pt able to visually scan to locate call bell and verbalize vague situations in which he would utilize it. Patient demonstrated some semantic paraphasias a few times in informal conversation (for instance, he called the window a "windshield").   Patient seen for dysphagia treatment. He is edentulous. Pt seen with thin liquids via cup and straw sips, no overt s/sx aspiration. Audible swallow noted. With puree trials (magic cup), patient with mild/moderately prolonged bolus formation/oral transit  prior to initiation of the swallow. Good oral clearance and no overt s/sx aspiration.  Pt seen with soft solid trial of blueberry yogurt with fruit (blueberries). He demonstrated  moderately prolonged mastication and some difficulty with bolus formation. Belching x1 immediately after the swallow. Delayed coughx1 following the swallow (>6 seconds). Patient becoming fatigued, closing eyes and holding bolus in oral cavity. He was able to be roused with verbal cues to masticate and swallow. Recommend continue dysphagia 1 as he is at increased risk of aspiration secondary to mentation. ST to follow acutely.   HPI HPI: Jeremy Sherman  is a 69 y.o. male, w hypothyroidism, hypertension, hyperlipidemia, prediabetes, CAD s/p CABG 07/02/2007, smoker, was recently admitted at [facility] 03/11/19-03/13/19, for dizziness thought related to hypertensive urgency, apparently presented to [facility] ER 03/17/19 for dizziness and syncope at this pcp office, pt was unresponsive , hypotensive and diaphoretic.  Imaging revealed subacute infarct within the right cerebellum and right brachium pontis and punctate acute left parietal lobe cortical infarct.      SLP Plan  Continue with current plan of care       Recommendations  Diet recommendations: Dysphagia 1 (puree);Thin liquid Liquids provided via: Cup;Straw Medication Administration: Crushed with puree Supervision: Full supervision/cueing for compensatory strategies Compensations: Slow rate;Small sips/bites;Minimize environmental distractions Postural Changes and/or Swallow Maneuvers: Upright 30-60 min after meal;Seated upright 90 degrees                Oral Care Recommendations: Oral care BID Follow up Recommendations: Inpatient Rehab SLP Visit Diagnosis: Dysphagia, oral phase (R13.11) Plan: Continue with current plan of care       [place], M.Ed., [facility] Acute Rehabilitation [phone]: Acute Rehab office [phone] - pager   Jeremy Sherman 03/31/2019, 1:23 PM

## 2019-04-01 LAB — GLUCOSE, CAPILLARY
Glucose-Capillary: 100 mg/dL — ABNORMAL HIGH (ref 70–99)
Glucose-Capillary: 138 mg/dL — ABNORMAL HIGH (ref 70–99)
Glucose-Capillary: 80 mg/dL (ref 70–99)
Glucose-Capillary: 132 mg/dL — ABNORMAL HIGH (ref 70–99)

## 2019-04-01 NOTE — H&P (Signed)
Physical Medicine and Rehabilitation Admission H&P     HPI: Jeremy Sherman is a 69 year old right-handed male with history of hypertension, hyperlipidemia, prediabetes, 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  Per chart review patient lives with spouse and daughter.  1 level home 4 steps to entry.  Works full-time as a Glass blower/designer.  By report patient had episode of dizziness confusion on 03/17/2019.  He went to his PCPs office became unresponsive hypotensive and diaphoretic.  Blood pressure reportedly lower than 100 he was brought by EMS to [facility].  Patient had recently been hospitalized at [facility] 03/11/2019 to 03/13/2019 for hypertensive urgency.  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  TEE at outside hospital showed left atrial appendage thrombus was placed on intravenous heparin.  He was transferred to [facility] for further evaluation.  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 Oneida Alar 03/26/2019.  Found to have right hemiparesis following procedure.  Follow-up imaging showed early subacute infarct in the right cerebellar peduncle, likely small vessel disease.  Patient did remain intubated through 03/27/2019.  He had been receiving Cleviprex for blood pressure control.  Initially on intravenous heparin transition to Eliquis 5 mg twice daily as well as Brilinta.  Patient with persistent low-grade fever with urinalysis 03/29/2019 + nitrite placed on Levaquin x5 doses that has been completed blood cultures are pending Currently maintained on a dysphagia #1 thin liquid diet.  Hospital course findings of elevated TSH level  45.808 and placed on Synthroid.  Therapy evaluations completed and patient was admitted for a comprehensive rehab program  Pt reports he's voiding OK_ said his last BM was probably yesterday- denies feelings of constipation. Is now on D#2 diet with thin liquids.   Although chart says R foot ischemia is asymptomatic, pt c/o pain in RLE from foot to above ankle that's diffuse, not point tender.  C/o being sleepy- and TSH was 45! Fits the medical picture.    Review of Systems  Constitutional: Positive for fever and malaise/fatigue.  HENT: Negative for hearing loss.   Eyes: Negative for blurred vision and double vision.  Respiratory: Negative for cough and shortness of breath.   Cardiovascular: Positive for leg swelling. Negative for chest pain and palpitations.  Gastrointestinal: Positive for constipation. Negative for heartburn, nausea and vomiting.  Genitourinary: Positive for urgency. Negative for dysuria, flank pain and hematuria.  Musculoskeletal: Positive for joint pain and myalgias.  Skin: Negative for rash.  Neurological: Positive for weakness.  All other systems reviewed and are negative.  Past Medical History:  Diagnosis Date  . CAD (coronary artery disease)   . Carotid stenosis, left    L ICA 50%  . Hyperlipidemia   . Hypertension   . Hypertensive urgency 03/11/2019  . 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 Prediabetes    Past Surgical History:  Procedure Laterality Date  . CORONARY ARTERY BYPASS GRAFT  07/02/2007   [place]-> LAD SVG-> PD branch of RCA [name]) at [place] Left 03/26/2019   Procedure: EXPOSURE OF LEFT COMMON CAROTID ARTERY AND PLACEMENT OF SHEATH;  Surgeon: Marty Heck, MD;  Location: [facility] OR;  Service: Vascular;  Laterality: Left;  . ENDARTERECTOMY Left 03/26/2019   Procedure: REMOVAL OF CAROTID SHEATH AND CLOSURE OF  CAROTID ARTERY;  Surgeon: Serafina Mitchell, MD;  Location: [facility] OR;  Service: Vascular;   Laterality: Left;  . IR ANGIO INTRA EXTRACRAN SEL COM CAROTID INNOMINATE BILAT MOD SED  03/21/2019  . IR ANGIO VERTEBRAL SEL SUBCLAVIAN INNOMINATE BILAT MOD SED  03/21/2019   Family History  Problem Relation Age of Onset  . Stroke Mother    Social History:  reports that he has been smoking cigarettes. He started smoking about 45 years ago. He has a 22.50 pack-year smoking history. He has never used smokeless tobacco. He reports previous alcohol use. No history on file for drug. lives with wife who is in good health in [place], [place] in 2 story house 2-4 STE Allergies: No Known Allergies Medications Prior to Admission  Medication Sig Dispense Refill  . aspirin EC 81 MG tablet Take 81 mg by mouth daily.    Marland Kitchen omega-3 acid ethyl esters (LOVAZA) 1 g capsule Take 1 g by mouth daily.      Drug Regimen Review Drug regimen was reviewed and remains appropriate with no significant issues identified  Home: Home Living Family/patient expects to be discharged to:: Private residence Living Arrangements: Spouse/significant other Available Help at Discharge: Family, Available 24 hours/day Type of Home: House Home Access: Stairs to enter Technical brewer of Steps: 4 Entrance Stairs-Rails: None Home Layout: One level Bathroom Shower/Tub: Chiropodist: Standard Home Equipment: [employer] - single point Additional Comments: works full time as a Tax inspector History: Prior Function Level of Independence: Independent Comments: drives  Functional Status:  Mobility: Bed Mobility Overal bed mobility: Needs Assistance Bed Mobility: Supine to Sit Supine to sit: Min assist Sit to supine: Max assist, +2 for physical assistance, +2 for safety/equipment General bed mobility comments: pt attempted to sit straight up in bed and was unable, vc's for rolling over and pt was able to complete task and move LE's off bed. Min HHA for elevation of trunk into sitting and vc's  needed to scoot to EOB. Pt needed redirecting to this task several times due to decreased attention Transfers Overall transfer level: Needs assistance Equipment used: Rolling walker (2 wheeled) Transfers: Sit to/from Stand Sit to Stand: +2 physical assistance, +2 safety/equipment, Mod assist Stand pivot transfers: Min assist General transfer comment: max A +2 for first sit<>stand from bed but mod A +2 for for 3 subsequent sit<>stand from recliner. Cues for full extension of trunk as pt tends to keep wt posterior in heels and trunk flexed at hips.  Ambulation/Gait Ambulation/Gait assistance: Mod assist, +2 safety/equipment Gait Distance (Feet): 7 Feet Assistive device: Rolling walker (2 wheeled) Gait Pattern/deviations: Wide base of support, Decreased stride length, Trunk flexed, Step-to pattern General Gait Details: pt with very short step length and kept trying to push RW further fwd without moving feet. Mod A and mod vc's for safe use of RW and each step.  Gait velocity: decr Gait velocity interpretation: <1.31 ft/sec, indicative of household ambulator Stairs: Yes Stairs assistance: Min assist Stair Management: One rail Left, Forwards, Alternating pattern Number of Stairs: 5 General stair comments: safe with use of rails    ADL: ADL Overall ADL's : Needs assistance/impaired Eating/Feeding: Set up, Sitting Grooming: Sitting, Moderate assistance, Standing, Min guard Grooming Details (indicate cue type and reason): pt prepped toothbrush while sitting in recliner, brushed teeth while standing at  the sink with modA for stability in standing Upper Body Bathing: Moderate assistance, Sitting Lower Body Bathing: Maximal assistance, Sit to/from stand Upper Body Dressing : Moderate assistance, Sitting Lower Body Dressing: Maximal assistance, Sit to/from stand Toilet Transfer: Maximal assistance, Moderate assistance, +2 for physical assistance, +2 for safety/equipment, RW, Ambulation Toilet  Transfer Details (indicate cue type and reason): ambulated to sink with maxA+2 for sit<>stand and modA for ambulation at RW level with assist for chair follow Toileting- Clothing Manipulation and Hygiene: Maximal assistance, +2 for physical assistance, +2 for safety/equipment Toileting - Clothing Manipulation Details (indicate cue type and reason): maxA for sit<>stand Tub/ Shower Transfer: Min guard, 3 in 1, Ambulation Tub/Shower Transfer Details (indicate cue type and reason): education given on use of 3:1 in tub shower Functional mobility during ADLs: Moderate assistance, Maximal assistance, +2 for physical assistance, +2 for safety/equipment, Rolling walker, Cueing for safety, Cueing for sequencing General ADL Comments: ambulated from EOB to sink with need for chair follow;pt uanble to progress into full upright posture even at sink level with use of mirror for visual assistance and tactile input from therapists  Cognition: Cognition Overall Cognitive Status: Impaired/Different from baseline Arousal/Alertness: Awake/alert Orientation Level: Oriented to person, Oriented to place, Oriented to situation Attention: Focused, Sustained Focused Attention: Appears intact Sustained Attention: Appears intact Memory: Impaired Memory Impairment: Decreased short term memory Decreased Short Term Memory: Verbal basic Problem Solving: Impaired Problem Solving Impairment: Verbal complex Executive Function: Reasoning Reasoning: Impaired Reasoning Impairment: Verbal complex Cognition Arousal/Alertness: Lethargic Behavior During Therapy: Flat affect Overall Cognitive Status: Impaired/Different from baseline Area of Impairment: Safety/judgement, Problem solving, Awareness, Following commands, Orientation, Memory, Attention Orientation Level: Time Current Attention Level: Sustained Memory: Decreased short-term memory, Decreased recall of precautions Following Commands: Follows one step commands  consistently, Follows one step commands with increased time Safety/Judgement: Decreased awareness of safety Awareness: Intellectual Problem Solving: Difficulty sequencing, Requires verbal cues, Slow processing, Requires tactile cues General Comments: pt with delayed processing and thought it was 1991 but once alert was appropriate throughout session even responding to humor;consistent cues for safety;appears to have decreased awareness of deficits   Physical Exam: Blood pressure (!) 162/95, pulse 74, temperature 98.2 F (36.8 C), temperature source Oral, resp. rate 18, height 6\' 5"  (1.956 m), weight 106.1 kg, SpO2 99 %. Physical Exam  Nursing note and vitals reviewed. Constitutional: He appears well-developed and well-nourished.  Large man who's 6 ft 5 inches; sitting in bedside chair; wearing mask on forehead- asked for help to fix it, at 1pm- lunch was not in front of pt- was watching TV, NAD  HENT:  Head: Normocephalic and atraumatic.  Nose: Nose normal.  Mouth/Throat: No oropharyngeal exudate.  Face symmetrical- no facial droop; tongue midline; face sensation intact  Eyes: Pupils are equal, round, and reactive to light. Conjunctivae are normal. Right eye exhibits no discharge. Left eye exhibits no discharge.  EOMI B/L- L horizontal nystagmus with EOM to L only;    Neck: No tracheal deviation present.  Left neck incision clean and dry  Cardiovascular:  RRR  Respiratory:  CTA B/L- no w/r/r  GI:  Protuberant; soft, NT, ND, (+)hyperactive BS  Musculoskeletal:     Cervical back: Normal range of motion and neck supple.     Comments: RUE- bicep 4/5, tricep 4+/5, WE 4+/5, grip 4/5, finger abd 4+/5  LUE 5-/5 in all same muscles tested  RLE- HF 2+/5, KE and KF 3-/5, DF and PF 3+/5 LLE- HF 2/5, KE and KF 3-/5, DF and  PF 3/5  C/o a lot of pain with ROM of R and L legs.   Neurological:  Patient is alert and responds to verbal stimuli however he prefers to keep his eyes closed. Provides  his name age and date of birth. Follows simple commands.  Sensation to light touch intact in all 4 extremities Michela Pitcher was dizzy, so didn't want to do finger to nose/rapid alternating movements   Skin:  Severe plaque like vascular changes to knees B/L with a scab on L  Shin that was flaking off some  Wooding of hands from loss of fluids   Psychiatric:  Flat affect    Results for orders placed or performed during the hospital encounter of 03/20/19 (from the past 48 hour(s))  CBC     Status: Abnormal   Collection Time: 03/30/19  6:40 AM  Result Value Ref Range   WBC 7.6 4.0 - 10.5 K/uL   RBC 2.36 (L) 4.22 - 5.81 MIL/uL   Hemoglobin 7.4 (L) 13.0 - 17.0 g/dL   HCT 22.7 (L) 39.0 - 52.0 %   MCV 96.2 80.0 - 100.0 fL   MCH 31.4 26.0 - 34.0 pg   MCHC 32.6 30.0 - 36.0 g/dL   RDW 15.4 11.5 - 15.5 %   Platelets 200 150 - 400 K/uL   nRBC 0.0 0.0 - 0.2 %    Comment: Performed at [facility], [place] [address], [place],  [id number]  Basic metabolic panel     Status: Abnormal   Collection Time: 03/30/19  6:40 AM  Result Value Ref Range   Sodium 138 135 - 145 mmol/L   Potassium 3.4 (L) 3.5 - 5.1 mmol/L   Chloride 107 98 - 111 mmol/L   CO2 23 22 - 32 mmol/L   Glucose, Bld 112 (H) 70 - 99 mg/dL   BUN 12 8 - 23 mg/dL   Creatinine, Ser 0.97 0.61 - 1.24 mg/dL   Calcium 8.0 (L) 8.9 - 10.3 mg/dL   GFR calc non Af Amer >60 >60 mL/min   GFR calc Af Amer >60 >60 mL/min   Anion gap 8 5 - 15    Comment: Performed at [facility] [address], [place], [place] [address]  Glucose, capillary     Status: None   Collection Time: 03/30/19  6:43 AM  Result Value Ref Range   Glucose-Capillary 96 70 - 99 mg/dL  Glucose, capillary     Status: Abnormal   Collection Time: 03/30/19 12:00 PM  Result Value Ref Range   Glucose-Capillary 114 (H) 70 - 99 mg/dL  Glucose, capillary     Status: Abnormal   Collection Time: 03/30/19  3:53 PM  Result Value Ref Range   Glucose-Capillary 115 (H) 70 -  99 mg/dL  Glucose, capillary     Status: Abnormal   Collection Time: 03/30/19  4:32 PM  Result Value Ref Range   Glucose-Capillary 106 (H) 70 - 99 mg/dL  Glucose, capillary     Status: None   Collection Time: 03/30/19  9:15 PM  Result Value Ref Range   Glucose-Capillary 97 70 - 99 mg/dL  Glucose, capillary     Status: None   Collection Time: 03/31/19  6:04 AM  Result Value Ref Range   Glucose-Capillary 94 70 - 99 mg/dL  Basic metabolic panel     Status: Abnormal   Collection Time: 03/31/19  6:19 AM  Result Value Ref Range   Sodium 136 135 - 145 mmol/L  Potassium 3.5 3.5 - 5.1 mmol/L   Chloride 101 98 - 111 mmol/L   CO2 23 22 - 32 mmol/L   Glucose, Bld 100 (H) 70 - 99 mg/dL   BUN 10 8 - 23 mg/dL   Creatinine, Ser 0.93 0.61 - 1.24 mg/dL   Calcium 8.5 (L) 8.9 - 10.3 mg/dL   GFR calc non Af Amer >60 >60 mL/min   GFR calc Af Amer >60 >60 mL/min   Anion gap 12 5 - 15    Comment: Performed at [facility] [address] [place], [place] [address]  Magnesium     Status: None   Collection Time: 03/31/19  6:19 AM  Result Value Ref Range   Magnesium 2.0 1.7 - 2.4 mg/dL    Comment: Performed at [facility] [address] [place], [place] [address]  CBC     Status: Abnormal   Collection Time: 03/31/19  6:19 AM  Result Value Ref Range   WBC 8.6 4.0 - 10.5 K/uL   RBC 2.53 (L) 4.22 - 5.81 MIL/uL   Hemoglobin 7.9 (L) 13.0 - 17.0 g/dL   HCT 23.7 (L) 39.0 - 52.0 %   MCV 93.7 80.0 - 100.0 fL   MCH 31.2 26.0 - 34.0 pg   MCHC 33.3 30.0 - 36.0 g/dL   RDW 15.0 11.5 - 15.5 %   Platelets 244 150 - 400 K/uL   nRBC 0.0 0.0 - 0.2 %    Comment: Performed at [facility], [place] [address], [place], [place] [address]  Glucose, capillary     Status: None   Collection Time: 03/31/19 12:04 PM  Result Value Ref Range   Glucose-Capillary 97 70 - 99 mg/dL   Comment 1 QC Due    Comment 2 Notify RN    Comment 3 Document in Chart   Glucose, capillary     Status: Abnormal    Collection Time: 03/31/19  4:34 PM  Result Value Ref Range   Glucose-Capillary 102 (H) 70 - 99 mg/dL  Glucose, capillary     Status: Abnormal   Collection Time: 03/31/19  9:15 PM  Result Value Ref Range   Glucose-Capillary 103 (H) 70 - 99 mg/dL   No results found.     Medical Problem List and Plan: 1.  Right side weakness with dysphagia secondary to left ACA pericallosal segment aneurysm S/P stent assisted coiling as well as left ICA proximal stenosis S/P CEA followed by stent angioplasty 03/26/2019 per interventional radiology with post procedure right cerebellar peduncle infarction  -patient may shower if covers IR sites  -ELOS/Goals:  2 to 2.5 weeks; supervision to Mod I 2.  Antithrombotics: -DVT/anticoagulation: Eliquis  -antiplatelet therapy: Brilinta 3. Pain Management: Tramadol as needed 4. Mood: Provide emotional support  -antipsychotic agents: N/A 5. Neuropsych: This patient is capable of making decisions on his own behalf. 6. Skin/Wound Care: Routine skin checks 7. Fluids/Electrolytes/Nutrition: Routine in and outs with follow-up chemistries 8.  Hypertension.  Patient on no home med antihypertensive medications.  Monitor with increased mobility 9.  Hyperlipidemia.  Lipitor 10.  Tobacco abuse.  Counseling 11.  Prediabetes.  Hemoglobin A1c 6.4.  SSI.  Patient on no diabetic agents prior to admission. Will likely need something for this prior to d/c.  12.  BPH /UTI.  Flomax 0.4 mg daily.  Urine culture 03/28/2019 no growth.  Urine study 03/29/2019 + nitrite.  Empiric Levaquin x5 doses completed. -per note, had replaced the foley due to  urinary retention? Monitor 13.  Hypothyroidism. TSH 45! Synthroid started.  Patient will need follow-up thyroid panel in 3 months 14. Per chart, severe PAD with R foot ischemia- has LE foot/ankle pain on exam that is likely c/w PAD- will monitor closely for skin breakdown, etc.      Cathlyn Parsons, PA-C 04/01/2019   I have personally  performed a face to face diagnostic evaluation of this patient and formulated the key components of the plan.  Additionally, I have personally reviewed laboratory data, imaging studies, as well as relevant notes and concur with the physician assistant's documentation above.   The patient's status has not changed from the original H&P.  Any changes in documentation from the acute care chart have been noted above.

## 2019-04-01 NOTE — Progress Notes (Signed)
Physical Therapy Treatment Patient Details Name: Jeremy Sherman MRN: [id number] DOB: 12/20/1950 Today's Date: 04/01/2019    History of Present Illness 69 year old male with a history of hypothyroidism, hypertension, hyperlipidemia, prediabetes, CAD s/p CABG 07/02/2007, who comes in from outside hospital with R cerebellar infarct and L parietal infarct. s/p arteriogram which additionally reveals L ACA pericallosal aneurysm. Pt now s/p L common carotid arteriogram with angioplasty of L MCA M1 segment, L ACA pericallosal aneurysm coiling, and L ICA stenting. Extubated 03/27/19.    PT Comments    Pt more alert upon arrival today, continues to be disoriented to time and does not remember PT session with me yesterday. Ambulated 10' and 7' with seated rest in between, use of RW and mod A +2 for chair close behind. Pt able to stand with more erect posture than yesterday. Pt positioned in chair after session. PT will continue to follow.    Follow Up Recommendations  CIR;Supervision/Assistance - 24 hour     Equipment Recommendations  None recommended by PT    Recommendations for Other Services       Precautions / Restrictions Precautions Precautions: Fall Restrictions Weight Bearing Restrictions: No    Mobility  Bed Mobility Overal bed mobility: Needs Assistance Bed Mobility: Supine to Sit     Supine to sit: Min guard     General bed mobility comments: vc's to grasp R rail with L hand and then pt able to come all the way to edge without physical assist. Further vc's to oontinue  until feet all the way to floor  Transfers Overall transfer level: Needs assistance Equipment used: Rolling walker (2 wheeled) Transfers: Sit to/from Stand Sit to Stand: +2 physical assistance;+2 safety/equipment;Mod assist         General transfer comment: pt has difficulty standing from bed, max A +2 needed. Better from chair with firm arm rests even though surface is lower, mod A +2 for power  up and fwd wt shift  Ambulation/Gait Ambulation/Gait assistance: Mod assist;+2 safety/equipment;Min assist Gait Distance (Feet): 16 Feet(10', seated rest 6') Assistive device: Rolling walker (2 wheeled) Gait Pattern/deviations: Wide base of support;Decreased stride length;Trunk flexed;Step-through pattern Gait velocity: decr Gait velocity interpretation: <1.31 ft/sec, indicative of household ambulator General Gait Details: mod A initially but progressed to min A with chair close behind. vc's for erect posture, slightly better than yesterday. Step through pattern but not passing opposite foot with each step. DOE 2/4   Marine scientist Rankin (Stroke Patients Only) Modified Rankin (Stroke Patients Only) Pre-Morbid Rankin Score: No symptoms Modified Rankin: Moderately severe disability     Balance Overall balance assessment: Needs assistance Sitting-balance support: Feet supported;No upper extremity supported Sitting balance-Leahy Scale: Fair Sitting balance - Comments: able to sit EOB and perform LE ther ex without LOB   Standing balance support: Bilateral upper extremity supported;During functional activity;Single extremity supported Standing balance-Leahy Scale: Poor Standing balance comment: heavily reliant on RW                             Cognition Arousal/Alertness: Awake/alert Behavior During Therapy: Flat affect Overall Cognitive Status: Impaired/Different from baseline Area of Impairment: Safety/judgement;Problem solving;Awareness;Following commands;Orientation;Memory;Attention                 Orientation Level: Time Current Attention Level: Sustained Memory: Decreased short-term memory;Decreased recall of precautions Following Commands: Follows one  step commands consistently;Follows one step commands with increased time Safety/Judgement: Decreased awareness of safety Awareness: Intellectual Problem  Solving: Difficulty sequencing;Requires verbal cues;Slow processing;Requires tactile cues        Exercises General Exercises - Lower Extremity Ankle Circles/Pumps: AROM;Both;10 reps;Seated Long Arc Quad: Both;Seated;10 reps;AROM Hip Flexion/Marching: AROM;Both;10 reps;Seated    General Comments General comments (skin integrity, edema, etc.): dizzy with initial standing but cleared. HR 98 bpm with ambulation      Pertinent Vitals/Pain Pain Assessment: Faces Faces Pain Scale: Hurts little more Pain Location: toes to touch with donning socks, nails long and crossing over each other Pain Descriptors / Indicators: Tender Pain Intervention(s): Limited activity within patient's tolerance;Monitored during session    Home Living                      Prior Function            PT Goals (current goals can now be found in the care plan section) Acute Rehab PT Goals Patient Stated Goal: get stronger PT Goal Formulation: With patient Time For Goal Achievement: 04/05/19 Potential to Achieve Goals: Good Progress towards PT goals: Progressing toward goals    Frequency    Min 4X/week      PT Plan Current plan remains appropriate    Co-evaluation              AM-PAC PT "6 Clicks" Mobility   Outcome Measure  Help needed turning from your back to your side while in a flat bed without using bedrails?: A Little Help needed moving from lying on your back to sitting on the side of a flat bed without using bedrails?: A Little Help needed moving to and from a bed to a chair (including a wheelchair)?: A Lot Help needed standing up from a chair using your arms (e.g., wheelchair or bedside chair)?: A Lot Help needed to walk in hospital room?: A Lot Help needed climbing 3-5 steps with a railing? : Total 6 Click Score: 13    End of Session Equipment Utilized During Treatment: Gait belt Activity Tolerance: Patient tolerated treatment well Patient left: with call bell/phone  within reach;in chair;with chair alarm set Nurse Communication: Mobility status PT Visit Diagnosis: Other abnormalities of gait and mobility (R26.89);Unsteadiness on feet (R26.81)     Time: [id number] PT Time Calculation (min) (ACUTE ONLY): 24 min  Charges:  $Gait Training: 23-37 mins                     Leighton Roach, [place]  Pager [phone] Office [place] 04/01/2019, 12:49 PM

## 2019-04-01 NOTE — Progress Notes (Signed)
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  He then presented to [facility] on 03/17/2019 for dizziness and syncope.  Work-up as below:   CT brain 03/17/19: 8x83m focal slight dense lesion in the left and anterior callosal region.   MRI brain 03/17/19: Early subacute infarct within the right cerebellum and right brachium pontis. Punctate acute left parietal lobe cortical infarct. [id number] focus of enhancement in the pericallosal region w imaging features most suggestive of ACA pericallosal aneurysm, CTA recommended. Chronic lacunar infarct in the right caudate nucleus  CTA head/neck: 4x7 mm anuerysm left pericallosal segment left ACA without evidence of rupture. Mild stenosis of right M1 segment, Moderate to severe stenosis left M1 segment poststenotic dilation. Mild stenoss in the PCA bilaterally. Atherosclerotic disease, mural thrombus in the prox left ICA narrowing the lumen by 50%. Eccentric atherosclerotic dsiease vs focal dissection in the left ICA below the skull base without significant stenosis  TEE 03/20/19: EF 45-50% , left atrial appendage thrombus, and mild MR  He underwent left common carotid artery angiogram followed by stent angioplasty of left MCM M1 segment. He was admitted to ICU post-procedure, then subsequently extubated and transferred back to hospitalist service 03/29/2019.   New events last 24 hours / Subjective: Had fever this morning 100.6. He is eating breakfast, has no physical complaints of SOB, cough, abdominal pain, N/V.   Assessment & Plan:     [facility] Problems:   [facility]  Carotid stenosis   CAD (coronary artery disease)   Brain aneurysm   Middle cerebral artery stenosis   Aneurysm of anterior cerebral artery   Essential hypertension   Prediabetes   Dysphagia, post-stroke   Thrombus of left atrial appendage   Left MCA stenosis and aneurysm, left ACA pericallosal segment aneurysm, left ICA proximal stenosis  -S/p left common carotid artery angiogram, stent angioplasty left MCA, stent assisted coiling of the left ACA pericallosal aneurysm 03/26/2019 -Continue Eliquis, brilinta  -Follow up with Dr. DeEstanislado Pandyn 3 weeks after discharge  -Follow up with Dr. SeLeonie Mann 4 weeks after discharge   Acute respiratory failure, hypoxemic, post-procedure -Now extubated and remains on room air   Fever -Currently on levaquin for presumed UTI, although urine culture 1/29 negative  -Fever again 2/2  -Check blood culture   Left atrial appendage thrombus -Continue Eliquis  -Follow up with cardiology   HLD -Continue lipitor   Hypothyroidism -Continue Synthroid  Acute kidney injury -Baseline Cr ~1 -Resolved  DM type 2, well controlled -Ha1c 6.4 -SSI   Right foot ischemia, PVD -Vascular surgery Dr. FiEden Latheas seen the patient and ordered duplex ultrasound of the right lower extremity with bilateral ABIs.  He has severe PAD, chronic.  Patient is asymptomatic at this time.  No intervention planned in the hospital.  Patient to follow-up as outpatient.  BPH -Previous hospitalist discussed with Dr. BeLouis Meckelwho recommends to continue with Flomax at this time and consider voiding trial once patient is more stable.  If patient is unable to urinate, reinsert catheter and follow-up urology in 2 weeks after discharge. -Coude catheter removed but now having urinary retention again. Replace coude  catheter and plan to follow up with urology outpatient.     DVT prophylaxis: Eliquis  Code Status: Full Family Communication:  None at bedside Disposition Plan: Patient is from home prior to admission. Currently in-hospital treatment needed due to stroke work up and treatment, now medically stable and improved. Await until fever free 24 hours. Barrier(s) to discharge include CIR placement and suspect patient will discharge to CIR pending insurance auth   Consultants:   Neurology  Vascular surgery  IR  PCCM   Antimicrobials:  Anti-infectives (From admission, onward)   Start     Dose/Rate Route Frequency Ordered Stop   03/28/19 1200  levofloxacin (LEVAQUIN) tablet 750 mg     750 mg Oral Daily 03/28/19 1026 04/02/19 1159   03/25/19 1215  levofloxacin (LEVAQUIN) IVPB 750 mg  Status:  Discontinued     750 mg 100 mL/hr over 90 Minutes Intravenous Every 24 hours 03/25/19 1206 03/28/19 1026       Objective: Vitals:   03/31/19 2352 04/01/19 0412 04/01/19 0724 04/01/19 0805  BP: 133/72 (!) 162/95 106/75 (!) 153/69  Pulse: 76 74  72  Resp: 18 18 16 20   Temp: 99 F (37.2 C) 98.2 F (36.8 C)  (!) 100.6 F (38.1 C)  TempSrc: Oral Oral  Oral  SpO2: (!) 65% 99% 99% 98%  Weight:      Height:        Intake/Output Summary (Last 24 hours) at 04/01/2019 0945 Last data filed at 04/01/2019 0500 Gross per 24 hour  Intake 135 ml  Output 950 ml  Net -815 ml   Filed Weights   03/20/19 1842  Weight: 106.1 kg    Examination: General exam: Appears calm and comfortable  Respiratory system: Clear to auscultation. Respiratory effort normal. Cardiovascular system: S1 & S2 heard, RRR. No pedal edema. Gastrointestinal system: Abdomen is nondistended, soft and nontender. Normal bowel sounds heard. Central nervous system: Alert and oriented. Non focal exam. Speech clear  Extremities: Symmetric in appearance bilaterally  Skin: No rashes, lesions or ulcers on exposed skin  Psychiatry: Judgement and insight appear stable. Mood & affect appropriate.    Data Reviewed: I have personally reviewed following labs and imaging  studies  CBC: Recent Labs  Lab 03/27/19 0537 03/28/19 7824 03/29/19 0528 03/30/19 0640 03/31/19 0619  WBC 6.3 7.1 6.6 7.6 8.6  NEUTROABS 4.8  --   --   --   --   HGB 8.8* 8.2* 7.7* 7.4* 7.9*  HCT 25.8* 24.9* 23.9* 22.7* 23.7*  MCV 93.5 94.7 97.6 96.2 93.7  PLT 173 200 190 200 235   Basic Metabolic Panel: Recent Labs  Lab 03/27/19 0537 03/28/19 0633 03/29/19 0528 03/30/19 0640 03/31/19 0619  NA 137 138 139 138 136  K 3.8 3.2* 3.4* 3.4* 3.5  CL 109 110 108 107 101  CO2 20* 20* 22 23 23   GLUCOSE 121* 85 117* 112* 100*  BUN 9 11 13 12 10   CREATININE 0.96 1.36* 1.10 0.97 0.93  CALCIUM 7.8* 7.8* 7.9* 8.0* 8.5*  MG  --  1.7 2.3  --  2.0  PHOS  --  2.9 2.6  --   --    GFR: Estimated Creatinine Clearance: 93.1 mL/min (by C-G formula based on SCr of 0.93 mg/dL). Liver Function Tests: Recent Labs  Lab 03/28/19 0633 03/28/19 1553  AST 26 34  ALT 17 19  ALKPHOS 71 89  BILITOT 0.7 0.6  PROT 5.6* 6.1*  ALBUMIN 2.3* 2.3*   No results  for input(s): LIPASE, AMYLASE in the last 168 hours. No results for input(s): AMMONIA in the last 168 hours. Coagulation Profile: Recent Labs  Lab 03/29/19 0528  INR 1.4*   Cardiac Enzymes: No results for input(s): CKTOTAL, CKMB, CKMBINDEX, TROPONINI in the last 168 hours. BNP (last 3 results) No results for input(s): PROBNP in the last 8760 hours. HbA1C: No results for input(s): HGBA1C in the last 72 hours. CBG: Recent Labs  Lab 03/31/19 0604 03/31/19 1204 03/31/19 1634 03/31/19 2115 04/01/19 0626  GLUCAP 94 97 102* 103* 100*   Lipid Profile: No results for input(s): CHOL, HDL, LDLCALC, TRIG, CHOLHDL, LDLDIRECT in the last 72 hours. Thyroid Function Tests: No results for input(s): TSH, T4TOTAL, FREET4, T3FREE, THYROIDAB in the last 72 hours. Anemia Panel: No results for input(s): VITAMINB12, FOLATE, FERRITIN, TIBC, IRON, RETICCTPCT in the last 72 hours. Sepsis Labs: No results for input(s): PROCALCITON, LATICACIDVEN in  the last 168 hours.  Recent Results (from the past 240 hour(s))  SARS CORONAVIRUS 2 (TAT 6-24 HRS) Nasopharyngeal Nasopharyngeal Swab     Status: None   Collection Time: 03/24/19  7:29 PM   Specimen: Nasopharyngeal Swab  Result Value Ref Range Status   SARS Coronavirus 2 NEGATIVE NEGATIVE Final    Comment: (NOTE) SARS-CoV-2 target nucleic acids are NOT DETECTED. The SARS-CoV-2 RNA is generally detectable in upper and lower respiratory specimens during the acute phase of infection. Negative results do not preclude SARS-CoV-2 infection, do not rule out co-infections with other pathogens, and should not be used as the sole basis for treatment or other patient management decisions. Negative results must be combined with clinical observations, patient history, and epidemiological information. The expected result is Negative. Fact Sheet for Patients: SugarRoll.be Fact Sheet for Healthcare Providers: https://www.woods-mathews.com/ This test is not yet approved or cleared by the [place] FDA and  has been authorized for detection and/or diagnosis of SARS-CoV-2 by FDA under an Emergency Use Authorization (EUA). This EUA will remain  in effect (meaning this test can be used) for the duration of the COVID-19 declaration under Section 56 4(b)(1) of the Act, 21 U.S.C. section 360bbb-3(b)(1), unless the authorization is terminated or revoked sooner. Performed at [facility], [place] [address] [place], [place] [address]   Surgical PCR screen     Status: None   Collection Time: 03/26/19  2:15 AM   Specimen: Nasal Mucosa; Nasal Swab  Result Value Ref Range Status   MRSA, PCR NEGATIVE NEGATIVE Final   Staphylococcus aureus NEGATIVE NEGATIVE Final    Comment: (NOTE) The Xpert SA Assay (FDA approved for NASAL specimens in patients 69 years of age and older), is one component of a comprehensive surveillance program. It is not intended to diagnose  infection nor to guide or monitor treatment. Performed at [facility], [place] [address] [place], [place] [address]   Culture, Urine     Status: None   Collection Time: 03/28/19 10:28 AM   Specimen: Urine, Random  Result Value Ref Range Status   Specimen Description URINE, RANDOM  Final   Special Requests NONE  Final   Culture   Final    NO GROWTH Performed at [facility], [place] [address] [place], [place] [address]    Report Status 03/29/2019 FINAL  Final      Radiology Studies: No results found.    Scheduled Meds: . apixaban  5 mg Oral BID  . atorvastatin  80 mg Oral q1800  . chlorhexidine gluconate (MEDLINE  KIT)  15 mL Mouth Rinse BID  . Chlorhexidine Gluconate Cloth  6 each Topical Daily  . insulin aspart  0-9 Units Subcutaneous TID WC  . levofloxacin  750 mg Oral Daily  . levothyroxine  100 mcg Oral Q0600  . pantoprazole  40 mg Oral QHS  . sodium chloride flush  10-40 mL Intracatheter Q12H  . sodium chloride flush  10-40 mL Intracatheter Q12H  . tamsulosin  0.4 mg Oral Daily  . ticagrelor  90 mg Oral BID   Continuous Infusions: . sodium chloride Stopped (03/26/19 1727)     LOS: 12 days      Time spent: 20 minutes   Dessa Phi, DO [facility] 04/01/2019, 9:45 AM   Available via Epic secure chat 7am-7pm After these hours, please refer to coverage provider listed on [email or web address]

## 2019-04-02 ENCOUNTER — Inpatient Hospital Stay (HOSPITAL_COMMUNITY): Payer: BC Managed Care – PPO

## 2019-04-02 LAB — CBC WITH DIFFERENTIAL/PLATELET
Abs Immature Granulocytes: 0.11 10*3/uL — ABNORMAL HIGH (ref 0.00–0.07)
Basophils Absolute: 0 10*3/uL (ref 0.0–0.1)
Basophils Relative: 0 %
Eosinophils Absolute: 0.1 10*3/uL (ref 0.0–0.5)
Eosinophils Relative: 1 %
HCT: 24.5 % — ABNORMAL LOW (ref 39.0–52.0)
Hemoglobin: 8.1 g/dL — ABNORMAL LOW (ref 13.0–17.0)
Immature Granulocytes: 1 %
Lymphocytes Relative: 7 %
Lymphs Abs: 0.7 10*3/uL (ref 0.7–4.0)
MCH: 30.9 pg (ref 26.0–34.0)
MCHC: 33.1 g/dL (ref 30.0–36.0)
MCV: 93.5 fL (ref 80.0–100.0)
Monocytes Absolute: 0.6 10*3/uL (ref 0.1–1.0)
Monocytes Relative: 7 %
Neutro Abs: 7.8 10*3/uL — ABNORMAL HIGH (ref 1.7–7.7)
Neutrophils Relative %: 84 %
Platelets: 318 10*3/uL (ref 150–400)
RBC: 2.62 MIL/uL — ABNORMAL LOW (ref 4.22–5.81)
RDW: 15 % (ref 11.5–15.5)
WBC: 9.3 10*3/uL (ref 4.0–10.5)
nRBC: 0 % (ref 0.0–0.2)

## 2019-04-02 LAB — GLUCOSE, CAPILLARY
Glucose-Capillary: 137 mg/dL — ABNORMAL HIGH (ref 70–99)
Glucose-Capillary: 126 mg/dL — ABNORMAL HIGH (ref 70–99)
Glucose-Capillary: 111 mg/dL — ABNORMAL HIGH (ref 70–99)
Glucose-Capillary: 86 mg/dL (ref 70–99)

## 2019-04-02 NOTE — Progress Notes (Signed)
  Speech Language Pathology Treatment: Dysphagia;Cognitive-Linquistic  Patient Details Name: Jeremy Sherman MRN: [id number] DOB: 03-29-1950 Today's Date: 04/02/2019 Time: [id number] SLP Time Calculation (min) (ACUTE ONLY): 23 min  Assessment / Plan / Recommendation Clinical Impression  Pt received at bedside, sitting upright in bed, on room air. Patient alert, oriented to self, month, city upon [place] entry. Patient seen with thin liquids via straw sips. Pt with no overt s/sx aspiration seen with thin liquids. Patient seen with chopped fruit (peaches, watermelon, pineapple) to assess for tolerance of diet upgrade. Patient with moderately prolonged mastication, resulting in energy depletion. Pt appeared to have slightly increased work of breathing, though he denied feeling short of breath while doing PO trials. Pt with suspected adequate swallow initiation, no overt s/sx aspiration. Recommend upgrade to dysphagia 2 solids for energy conservation, continue thin liquids.   Session also targeted his cognitive-linguistic goals. ST utilized spaced retrieval technique to target recall and carryover of functional information. Patient able to demonstrate to recall information provided verbally for up to 6 minutes. Pt demonstrated ability to locate and use call bell and provided information in which call bell utilization would be warranted with moderate verbal cues. In verbal problem solving task, patient benefited from reduced environmental stimuli and repetition of information to achieve adequate answers. ST to follow as per POC. Follow up ST recommended at next venue of care.    HPI HPI: Jeremy Sherman  is a 69 y.o. male, w hypothyroidism, hypertension, hyperlipidemia, prediabetes, CAD s/p CABG 07/02/2007, smoker, was recently admitted at [facility] 03/11/19-03/13/19, for dizziness thought related to hypertensive urgency, apparently presented to [facility] ER 03/17/19 for dizziness and syncope at  this pcp office, pt was unresponsive , hypotensive and diaphoretic.  Imaging revealed subacute infarct within the right cerebellum and right brachium pontis and punctate acute left parietal lobe cortical infarct.      SLP Plan  Continue with current plan of care       Recommendations  Diet recommendations: Dysphagia 2 (fine chop);Thin liquid Liquids provided via: Cup;Straw Medication Administration: Crushed with puree Supervision: Full supervision/cueing for compensatory strategies Compensations: Slow rate;Small sips/bites;Minimize environmental distractions Postural Changes and/or Swallow Maneuvers: Seated upright 90 degrees;Upright 30-60 min after meal       CIR vs HH         Oral Care Recommendations: Oral care BID SLP Visit Diagnosis: Dysphagia, unspecified (R13.10);Cognitive communication deficit (R41.841) Plan: Continue with current plan of care       Dixon, M.Ed., [place] [facility] Acute Rehabilitation [phone]: Acute Rehab office [phone] - pager    Athira Janowicz 04/02/2019, 10:14 AM

## 2019-04-02 NOTE — Progress Notes (Signed)
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  He then presented to [facility] on 03/17/2019 for dizziness and syncope.  Work-up as below:   CT brain 03/17/19: 8x48m focal slight dense lesion in the left and anterior callosal region.   MRI brain 03/17/19: Early subacute infarct within the right cerebellum and right brachium pontis. Punctate acute left parietal lobe cortical infarct. [id number] focus of enhancement in the pericallosal region w imaging features most suggestive of ACA pericallosal aneurysm, CTA recommended. Chronic lacunar infarct in the right caudate nucleus  CTA head/neck: 4x7 mm anuerysm left pericallosal segment left ACA without evidence of rupture. Mild stenosis of right M1 segment, Moderate to severe stenosis left M1 segment poststenotic dilation. Mild stenoss in the PCA bilaterally. Atherosclerotic disease, mural thrombus in the prox left ICA narrowing the lumen by 50%. Eccentric atherosclerotic dsiease vs focal dissection in the left ICA below the skull base without significant stenosis  TEE 03/20/19: EF 45-50% , left atrial appendage thrombus, and mild MR  He underwent left common carotid artery angiogram followed by stent angioplasty of left MCM M1 segment. He was admitted to ICU post-procedure, then subsequently extubated and transferred back to hospitalist service 03/29/2019.   New events last 24 hours / Subjective: Afebrile but did have Tmax 100.1 overnight. Patient continues to deny complaints on my exam; denies CP, SOB, cough, N/V/D, abdominal pain.  Indwelling cathter in place due to urinary retention.   Assessment & Plan:   [facility]:   [facility]  Carotid stenosis   CAD (coronary artery disease)   Brain aneurysm   Middle cerebral artery stenosis   Aneurysm of anterior cerebral artery   Essential hypertension   Prediabetes   Dysphagia, post-stroke   Thrombus of left atrial appendage   Left MCA stenosis and aneurysm, left ACA pericallosal segment aneurysm, left ICA proximal stenosis  -S/p left common carotid artery angiogram, stent angioplasty left MCA, stent assisted coiling of the left ACA pericallosal aneurysm 03/26/2019 -Continue Eliquis, brilinta  -Follow up with Dr. DeEstanislado Pandyn 3 weeks after discharge  -Follow up with Dr. SeLeonie Mann 4 weeks after discharge   Acute respiratory failure, hypoxemic, post-procedure -Now extubated and remains on room air   Fever -Completed levaquin for presumed UTI -Fever again 04/01/19. Blood culture pending -Per discussion with CIR coordinator, would like patient to be afebrile prior to discharge to CIR. Will also order CXR and CBC today and monitor temp off tylenol for next 24 hours   Left atrial appendage thrombus -Continue Eliquis  -Follow up with cardiology   HLD -Continue lipitor   Hypothyroidism -Continue Synthroid  Acute kidney injury -Baseline Cr ~1 -Resolved  DM type 2, well controlled -Ha1c 6.4 -SSI   Right foot ischemia, PVD -Vascular surgery Dr. FiEden Latheas seen the patient and ordered duplex ultrasound of the right lower extremity with bilateral ABIs.  He has severe PAD, chronic.  Patient is asymptomatic at this time.  No intervention planned in the hospital.  Patient to follow-up as outpatient.  BPH -Previous hospitalist discussed with Dr. BeLouis Meckelwho recommends to continue with Flomax at this time and consider voiding  trial once patient is more stable.  If patient is unable to urinate, reinsert catheter and follow-up urology in 2 weeks after  discharge. -Coude catheter removed but now having urinary retention again. Replace coude catheter and plan to follow up with urology outpatient.     DVT prophylaxis: Eliquis  Code Status: Full Family Communication: None at bedside Disposition Plan: Patient is from home prior to admission. Currently in-hospital treatment needed due to stroke work up and treatment, now medically stable and improved. Await until fever free 24 hours, fever work up in process. Barrier(s) to discharge include CIR placement.    Consultants:   Neurology  Vascular surgery  IR  PCCM   Antimicrobials:  Anti-infectives (From admission, onward)   Start     Dose/Rate Route Frequency Ordered Stop   03/28/19 1200  levofloxacin (LEVAQUIN) tablet 750 mg     750 mg Oral Daily 03/28/19 1026 04/01/19 1235   03/25/19 1215  levofloxacin (LEVAQUIN) IVPB 750 mg  Status:  Discontinued     750 mg 100 mL/hr over 90 Minutes Intravenous Every 24 hours 03/25/19 1206 03/28/19 1026       Objective: Vitals:   04/02/19 0222 04/02/19 0326 04/02/19 0347 04/02/19 0756  BP:  (!) 165/70 120/76 129/74  Pulse:  79 85 80  Resp:  20  20  Temp: 99.3 F (37.4 C) 99.6 F (37.6 C)  98.3 F (36.8 C)  TempSrc: Oral Oral  Oral  SpO2:  98%  98%  Weight:      Height:        Intake/Output Summary (Last 24 hours) at 04/02/2019 1041 Last data filed at 04/02/2019 1000 Gross per 24 hour  Intake 600 ml  Output 1825 ml  Net -1225 ml   Filed Weights   03/20/19 1842  Weight: 106.1 kg    Examination: General exam: Appears calm and comfortable  Respiratory system: Clear to auscultation. Respiratory effort normal. On room air  Cardiovascular system: S1 & S2 heard, RRR. No pedal edema. Gastrointestinal system: Abdomen is nondistended, soft and nontender. Normal bowel sounds heard. Central nervous system: Alert and oriented. Non focal exam. Speech clear  Extremities: Symmetric in appearance bilaterally  Skin: Bilateral feet with very  poor hygiene and onychomycosis, warm to touch, no sign of infection  Psychiatry: Judgement and insight appear stable. Mood & affect appropriate.    Data Reviewed: I have personally reviewed following labs and imaging studies  CBC: Recent Labs  Lab 03/27/19 0537 03/28/19 1093 03/29/19 0528 03/30/19 0640 03/31/19 0619  WBC 6.3 7.1 6.6 7.6 8.6  NEUTROABS 4.8  --   --   --   --   HGB 8.8* 8.2* 7.7* 7.4* 7.9*  HCT 25.8* 24.9* 23.9* 22.7* 23.7*  MCV 93.5 94.7 97.6 96.2 93.7  PLT 173 200 190 200 235   Basic Metabolic Panel: Recent Labs  Lab 03/27/19 0537 03/28/19 0633 03/29/19 0528 03/30/19 0640 03/31/19 0619  NA 137 138 139 138 136  K 3.8 3.2* 3.4* 3.4* 3.5  CL 109 110 108 107 101  CO2 20* 20* 22 23 23   GLUCOSE 121* 85 117* 112* 100*  BUN 9 11 13 12 10   CREATININE 0.96 1.36* 1.10 0.97 0.93  CALCIUM 7.8* 7.8* 7.9* 8.0* 8.5*  MG  --  1.7 2.3  --  2.0  PHOS  --  2.9 2.6  --   --    GFR: Estimated Creatinine Clearance: 93.1 mL/min (by C-G formula based on SCr of 0.93 mg/dL). Liver  Function Tests: Recent Labs  Lab 03/28/19 0633 03/28/19 1553  AST 26 34  ALT 17 19  ALKPHOS 71 89  BILITOT 0.7 0.6  PROT 5.6* 6.1*  ALBUMIN 2.3* 2.3*   No results for input(s): LIPASE, AMYLASE in the last 168 hours. No results for input(s): AMMONIA in the last 168 hours. Coagulation Profile: Recent Labs  Lab 03/29/19 0528  INR 1.4*   Cardiac Enzymes: No results for input(s): CKTOTAL, CKMB, CKMBINDEX, TROPONINI in the last 168 hours. BNP (last 3 results) No results for input(s): PROBNP in the last 8760 hours. HbA1C: No results for input(s): HGBA1C in the last 72 hours. CBG: Recent Labs  Lab 04/01/19 0626 04/01/19 1119 04/01/19 1647 04/01/19 2144 04/02/19 0553  GLUCAP 100* 138* 80 132* 137*   Lipid Profile: No results for input(s): CHOL, HDL, LDLCALC, TRIG, CHOLHDL, LDLDIRECT in the last 72 hours. Thyroid Function Tests: No results for input(s): TSH, T4TOTAL, FREET4,  T3FREE, THYROIDAB in the last 72 hours. Anemia Panel: No results for input(s): VITAMINB12, FOLATE, FERRITIN, TIBC, IRON, RETICCTPCT in the last 72 hours. Sepsis Labs: No results for input(s): PROCALCITON, LATICACIDVEN in the last 168 hours.  Recent Results (from the past 240 hour(s))  SARS CORONAVIRUS 2 (TAT 6-24 HRS) Nasopharyngeal Nasopharyngeal Swab     Status: None   Collection Time: 03/24/19  7:29 PM   Specimen: Nasopharyngeal Swab  Result Value Ref Range Status   SARS Coronavirus 2 NEGATIVE NEGATIVE Final    Comment: (NOTE) SARS-CoV-2 target nucleic acids are NOT DETECTED. The SARS-CoV-2 RNA is generally detectable in upper and lower respiratory specimens during the acute phase of infection. Negative results do not preclude SARS-CoV-2 infection, do not rule out co-infections with other pathogens, and should not be used as the sole basis for treatment or other patient management decisions. Negative results must be combined with clinical observations, patient history, and epidemiological information. The expected result is Negative. Fact Sheet for Patients: SugarRoll.be Fact Sheet for Healthcare Providers: https://www.woods-mathews.com/ This test is not yet approved or cleared by the [place] FDA and  has been authorized for detection and/or diagnosis of SARS-CoV-2 by FDA under an Emergency Use Authorization (EUA). This EUA will remain  in effect (meaning this test can be used) for the duration of the COVID-19 declaration under Section 56 4(b)(1) of the Act, 21 U.S.C. section 360bbb-3(b)(1), unless the authorization is terminated or revoked sooner. Performed at [facility], [place] [address], [place], [place] [address]   Surgical PCR screen     Status: None   Collection Time: 03/26/19  2:15 AM   Specimen: Nasal Mucosa; Nasal Swab  Result Value Ref Range Status   MRSA, PCR NEGATIVE NEGATIVE Final   Staphylococcus aureus  NEGATIVE NEGATIVE Final    Comment: (NOTE) The Xpert SA Assay (FDA approved for NASAL specimens in patients 91 years of age and older), is one component of a comprehensive surveillance program. It is not intended to diagnose infection nor to guide or monitor treatment. Performed at [facility], [place] [address] [place], [place] [address]   Culture, Urine     Status: None   Collection Time: 03/28/19 10:28 AM   Specimen: Urine, Random  Result Value Ref Range Status   Specimen Description URINE, RANDOM  Final   Special Requests NONE  Final   Culture   Final    NO GROWTH Performed at [facility], [place] [address] [place], [place] [address]    Report Status 03/29/2019  FINAL  Final      Radiology Studies: No results found.    Scheduled Meds: . apixaban  5 mg Oral BID  . atorvastatin  80 mg Oral q1800  . chlorhexidine gluconate (MEDLINE KIT)  15 mL Mouth Rinse BID  . Chlorhexidine Gluconate Cloth  6 each Topical Daily  . insulin aspart  0-9 Units Subcutaneous TID WC  . levothyroxine  100 mcg Oral Q0600  . pantoprazole  40 mg Oral QHS  . sodium chloride flush  10-40 mL Intracatheter Q12H  . sodium chloride flush  10-40 mL Intracatheter Q12H  . tamsulosin  0.4 mg Oral Daily  . ticagrelor  90 mg Oral BID   Continuous Infusions: . sodium chloride Stopped (03/26/19 1727)     LOS: 13 days      Time spent: 35 minutes   Dessa Phi, DO [facility] 04/02/2019, 10:41 AM   Available via Epic secure chat 7am-7pm After these hours, please refer to coverage provider listed on [email or web address]

## 2019-04-02 NOTE — Progress Notes (Signed)
Occupational Therapy Treatment Patient Details Name: Jeremy Sherman MRN: [id number] DOB: 12-11-1950 Today's Date: 04/02/2019    History of present illness 69 year old male with a history of hypothyroidism, hypertension, hyperlipidemia, prediabetes, CAD s/p CABG 07/02/2007, who comes in from outside hospital with R cerebellar infarct and L parietal infarct. s/p arteriogram which additionally reveals L ACA pericallosal aneurysm. Pt now s/p L common carotid arteriogram with angioplasty of L MCA M1 segment, L ACA pericallosal aneurysm coiling, and L ICA stenting. Extubated 03/27/19.   OT comments  Pt asleep in bed upon arrival, pleasantly awoke to therapists arrival and agreeable to OT/PT session. Pt reported dizziness throughout session, HR 95-115 with activity, BP within normal range. Reports dizziness subsided following ambulation in the hallway with chair follow. He required modA+2 for powerup and modA+1 for ambulation at RW level, he demonstrated improvement with upright posture this date. He attempted to complete grooming standing at sink level, but due to weakness required seated completion of activity. He requires cues from therapist for initiating seated rest breaks, safe hand placement, and safe use of RW during mobility. Pt will continue to benefit from skilled OT services to maximize safety and independence with ADL/IADL and functional mobility. Will continue to follow acutely and progress as tolerated.    Follow Up Recommendations  CIR;Supervision/Assistance - 24 hour    Equipment Recommendations  Tub/shower bench    Recommendations for Other Services      Precautions / Restrictions Precautions Precautions: Fall       Mobility Bed Mobility Overal bed mobility: Needs Assistance Bed Mobility: Supine to Sit     Supine to sit: Min assist     General bed mobility comments: minA due to pt report of dizziness with initial sitting: BP WNL  Transfers Overall transfer  level: Needs assistance Equipment used: Rolling walker (2 wheeled) Transfers: Sit to/from Stand Sit to Stand: +2 physical assistance;+2 safety/equipment;Mod assist         General transfer comment: modA+2 to powerup into standing    Balance Overall balance assessment: Needs assistance Sitting-balance support: Feet supported;No upper extremity supported Sitting balance-Leahy Scale: Fair Sitting balance - Comments: demonstrated preference for single UE support due to dizziness, able to sit without UE support   Standing balance support: Bilateral upper extremity supported;During functional activity Standing balance-Leahy Scale: Poor Standing balance comment: heavy reliance on external support, unable to stand upright without UE support                           ADL either performed or assessed with clinical judgement   ADL Overall ADL's : Needs assistance/impaired     Grooming: Cueing for safety;Minimal assistance;Sitting Grooming Details (indicate cue type and reason): attempted to complete in standing, pt required cue to initiate sitting                 Toilet Transfer: Moderate assistance;+2 for physical assistance;+2 for safety/equipment;RW Toilet Transfer Details (indicate cue type and reason): simulated, ambulated 85feet with modA+2 to stand, modA+1 for physical assistance with ambulation and +1 for chair follow Toileting- Clothing Manipulation and Hygiene: Moderate assistance;+2 for physical assistance;+2 for safety/equipment;Cueing for safety       Functional mobility during ADLs: Moderate assistance;+2 for physical assistance;+2 for safety/equipment;Rolling walker General ADL Comments: ambulated to sink with need for chair follow, unable to maintain standing balance at sink level due to reliance on BUE support on external surface     Vision   Vision  Assessment?: No apparent visual deficits Additional Comments: pt keeps eyes shut majority of session,  no impairments noted   Perception     Praxis      Cognition Arousal/Alertness: Awake/alert Behavior During Therapy: Flat affect Overall Cognitive Status: Impaired/Different from baseline Area of Impairment: Attention;Memory;Orientation;Following commands;Safety/judgement;Awareness;Problem solving                 Orientation Level: Disoriented to;Situation;Time;Place Current Attention Level: Sustained Memory: Decreased short-term memory;Decreased recall of precautions Following Commands: Follows one step commands consistently;Follows one step commands with increased time Safety/Judgement: Decreased awareness of safety;Decreased awareness of deficits Awareness: Intellectual Problem Solving: Difficulty sequencing;Requires verbal cues;Slow processing;Requires tactile cues General Comments: pt reports he is in [employer] initially, later in session reports he is at [facility], therapists reoriented pt;he continues to require vc to initiate task, problem solving and safety awareness;consistent cues for safe hand placement and safe use of RW;grunting frequently during session but denies pain;        Exercises     Shoulder Instructions       General Comments dizzy with initial sitting BP WNL;HR up to 115 with activity    Pertinent Vitals/ Pain       Pain Assessment: No/denies pain Pain Score: 0-No pain Pain Location: pt denies pain, but grunting frequently throughout session, declines pain/uncomfortability Pain Intervention(s): Limited activity within patient's tolerance;Monitored during session  Home Living                                          Prior Functioning/Environment              Frequency  Min 2X/week        Progress Toward Goals  OT Goals(current goals can now be found in the care plan section)  Progress towards OT goals: Progressing toward goals  Acute Rehab OT Goals Patient Stated Goal: get stronger OT Goal  Formulation: With patient Time For Goal Achievement: 04/16/19 Potential to Achieve Goals: Good ADL Goals Pt Will Perform Lower Body Bathing: with min assist;sit to/from stand Pt Will Perform Lower Body Dressing: with min assist;sitting/lateral leans;sit to/from stand Pt Will Transfer to Toilet: with min assist;ambulating Pt Will Perform Tub/Shower Transfer: with modified independence;3 in 1;ambulating Additional ADL Goal #1: Pt will demonstrate anticipatory awareness for safe engagement in ADL. Additional ADL Goal #2: Pt will complete multistep cognitive task with <3 errors for safe progression of IADLs in the home  Plan Discharge plan remains appropriate    Co-evaluation    PT/OT/SLP Co-Evaluation/Treatment: Yes Reason for Co-Treatment: Complexity of the patient's impairments (multi-system involvement);For patient/therapist safety;To address functional/ADL transfers   OT goals addressed during session: ADL's and self-care      AM-PAC OT "6 Clicks" Daily Activity     Outcome Measure   Help from another person eating meals?: A Little Help from another person taking care of personal grooming?: A Little Help from another person toileting, which includes using toliet, bedpan, or urinal?: A Lot Help from another person bathing (including washing, rinsing, drying)?: A Lot Help from another person to put on and taking off regular upper body clothing?: A Lot Help from another person to put on and taking off regular lower body clothing?: A Lot 6 Click Score: 14    End of Session Equipment Utilized During Treatment: Gait belt;Rolling walker  OT Visit Diagnosis: Unsteadiness on feet (R26.81);Other abnormalities of gait  and mobility (R26.89);Muscle weakness (generalized) (M62.81);Other symptoms and signs involving cognitive function   Activity Tolerance Patient tolerated treatment well   Patient Left in chair;with call bell/phone within reach;with chair alarm set   Nurse Communication  Mobility status        Time: [id number] OT Time Calculation (min): 29 min  Charges: OT General Charges $OT Visit: 1 Visit OT Treatments $Self Care/Home Management : 8-22 mins  Dorinda Hill OTR/L [place] Office: [place] 04/02/2019, 1:52 PM

## 2019-04-02 NOTE — Progress Notes (Signed)
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 s/p arteriogram which additionally reveals L ACA pericallosal aneurysm. Pt now s/p L common carotid arteriogram with angioplasty of L MCA M1 segment, L ACA pericallosal aneurysm coiling, and L ICA stenting. Extubated 03/27/19.    PT Comments    Pt performed gt training and functional mobility with DOE throughout.  He continues with flexed posture during session and required cues to correct.  Continue to recommend aggressive CIR therapies to improve strength and function before returning home.     Follow Up Recommendations  CIR;Supervision/Assistance - 24 hour     Equipment Recommendations  None recommended by PT    Recommendations for Other Services       Precautions / Restrictions Precautions Precautions: Fall Restrictions Weight Bearing Restrictions: No    Mobility  Bed Mobility Overal bed mobility: Needs Assistance Bed Mobility: Rolling;Sidelying to Sit Rolling: Mod assist Sidelying to sit: Mod assist;+2 for safety/equipment Supine to sit: Min assist     General bed mobility comments: Pt required assistance to roll to R side push with LLE and reach with LUE for bed rail.  Cues to open eyes to look for railing.  Once in side lying he required assistance to elevate trunk into sitting and advance LEs to edge of bed.  HOB elevated to improve ease.  Transfers Overall transfer level: Needs assistance Equipment used: Rolling walker (2 wheeled) Transfers: Sit to/from Stand Sit to Stand: +2 physical assistance;+2 safety/equipment;Mod assist         General transfer comment: Cues for hand placement as he attempts to  pull into standing with hand grips on RW.  Onc e in standing remains with flexed hips.  Pt's RW too short.  Weakness noted on R side.  Ambulation/Gait Ambulation/Gait assistance: Mod assist;+2 safety/equipment Gait Distance (Feet): 8 Feet(+ 40 ft) Assistive device: Rolling walker (2 wheeled) Gait Pattern/deviations: Wide base of support;Decreased stride length;Trunk flexed;Step-through pattern;Drifts right/left Gait velocity: decr   General Gait Details: Pt noted with drifting to the R due to weakness and poor safety when using RW.  Cues to keep RW close to his body. Pt fatigue and noted with DOE.   Stairs             Wheelchair Mobility    Modified Rankin (Stroke Patients Only) Modified Rankin (Stroke Patients Only) Pre-Morbid Rankin Score: No symptoms Modified Rankin: Moderately severe disability     Balance Overall balance assessment: Needs assistance Sitting-balance support: Feet supported;No upper extremity supported Sitting balance-Leahy Scale: Fair Sitting balance - Comments: demonstrated preference for single UE support due to dizziness, able to sit without UE support   Standing balance support: Bilateral upper extremity supported;During functional activity Standing balance-Leahy Scale: Poor Standing balance comment: heavy reliance on external support, unable to stand upright without UE support                            Cognition Arousal/Alertness: Awake/alert Behavior During Therapy: Flat affect Overall Cognitive Status: Impaired/Different from baseline Area of Impairment: Attention;Memory;Orientation;Following commands;Safety/judgement;Awareness;Problem solving                 Orientation Level: Disoriented to;Situation;Time;Place Current Attention Level: Sustained Memory: Decreased short-term memory;Decreased recall  of precautions Following Commands: Follows one step commands consistently;Follows one step commands with increased  time Safety/Judgement: Decreased awareness of safety;Decreased awareness of deficits Awareness: Intellectual Problem Solving: Difficulty sequencing;Requires verbal cues;Slow processing;Requires tactile cues General Comments: pt reports he is in [employer] initially, later in session reports he is at [facility], therapists reoriented pt;he continues to require vc to initiate task, problem solving and safety awareness;consistent cues for safe hand placement and safe use of RW;grunting frequently during session but denies pain;      Exercises      General Comments General comments (skin integrity, edema, etc.): dizzy with initial sitting BP WNL;HR up to 115 with activity      Pertinent Vitals/Pain Pain Assessment: No/denies pain(he denies pain but appears in pain.) Faces Pain Scale: Hurts little more Pain Location: pt denies pain, but grunting frequently throughout session, declines pain/uncomfortability Pain Descriptors / Indicators: Grimacing;Moaning Pain Intervention(s): Monitored during session;Repositioned    Home Living                      Prior Function            PT Goals (current goals can now be found in the care plan section) Acute Rehab PT Goals Patient Stated Goal: get stronger Potential to Achieve Goals: Good Progress towards PT goals: Progressing toward goals    Frequency    Min 4X/week      PT Plan Current plan remains appropriate    Co-evaluation PT/OT/SLP Co-Evaluation/Treatment: Yes Reason for Co-Treatment: Complexity of the patient's impairments (multi-system involvement);For patient/therapist safety;To address functional/ADL transfers PT goals addressed during session: Mobility/safety with mobility OT goals addressed during session: ADL's and self-care      AM-PAC PT "6 Clicks" Mobility   Outcome Measure  Help needed turning from your back to your side while in a flat bed without using bedrails?: A Little Help needed  moving from lying on your back to sitting on the side of a flat bed without using bedrails?: A Lot Help needed moving to and from a bed to a chair (including a wheelchair)?: A Lot Help needed standing up from a chair using your arms (e.g., wheelchair or bedside chair)?: A Lot Help needed to walk in hospital room?: A Lot Help needed climbing 3-5 steps with a railing? : A Lot 6 Click Score: 13    End of Session Equipment Utilized During Treatment: Gait belt Activity Tolerance: Patient tolerated treatment well Patient left: with call bell/phone within reach;in chair;with chair alarm set Nurse Communication: Mobility status PT Visit Diagnosis: Other abnormalities of gait and mobility (R26.89);Unsteadiness on feet (R26.81)     Time: [id number] PT Time Calculation (min) (ACUTE ONLY): 30 min  Charges:  $Gait Training: 8-22 mins                     Erasmo Leventhal , PTA Acute Rehabilitation Services Pager [phone] Office [phone]     Tavious Griesinger Eli Hose 04/02/2019, 2:20 PM

## 2019-04-02 NOTE — Progress Notes (Signed)
Inpatient Rehabilitation Admissions Coordinator  Discussed with Dr. Hulda Humphrey and Dr. Maylene Roes. Noted temp continues. Medical work up continues for temp and I will discuss with patient and his wife today.  Danne Baxter, RN, MSN Rehab Admissions Coordinator [phone] 04/02/2019 10:43 AM

## 2019-04-03 ENCOUNTER — Inpatient Hospital Stay (HOSPITAL_COMMUNITY)
Admission: RE | Admit: 2019-04-03 | Discharge: 2019-04-24 | DRG: 057 | Disposition: A | Discharge status: Home Health | Payer: BC Managed Care – PPO | Source: Intra-hospital | Attending: Physical Medicine & Rehabilitation | Admitting: Physical Medicine & Rehabilitation

## 2019-04-03 ENCOUNTER — Encounter (HOSPITAL_COMMUNITY): Payer: Self-pay | Admitting: Physical Medicine & Rehabilitation

## 2019-04-03 ENCOUNTER — Other Ambulatory Visit: Payer: Self-pay

## 2019-04-03 DIAGNOSIS — I69398 Other sequelae of cerebral infarction: Secondary | ICD-10-CM | POA: Diagnosis not present

## 2019-04-03 DIAGNOSIS — I639 Cerebral infarction, unspecified: Secondary | ICD-10-CM | POA: Diagnosis not present

## 2019-04-03 DIAGNOSIS — Z7982 Long term (current) use of aspirin: Secondary | ICD-10-CM

## 2019-04-03 DIAGNOSIS — R319 Hematuria, unspecified: Secondary | ICD-10-CM | POA: Diagnosis not present

## 2019-04-03 DIAGNOSIS — R7401 Elevation of levels of liver transaminase levels: Secondary | ICD-10-CM | POA: Diagnosis not present

## 2019-04-03 DIAGNOSIS — I2510 Atherosclerotic heart disease of native coronary artery without angina pectoris: Secondary | ICD-10-CM

## 2019-04-03 DIAGNOSIS — N390 Urinary tract infection, site not specified: Secondary | ICD-10-CM | POA: Diagnosis present

## 2019-04-03 DIAGNOSIS — I69351 Hemiplegia and hemiparesis following cerebral infarction affecting right dominant side: Principal | ICD-10-CM

## 2019-04-03 DIAGNOSIS — R4189 Other symptoms and signs involving cognitive functions and awareness: Secondary | ICD-10-CM | POA: Diagnosis not present

## 2019-04-03 DIAGNOSIS — L602 Onychogryphosis: Secondary | ICD-10-CM | POA: Diagnosis present

## 2019-04-03 DIAGNOSIS — I69391 Dysphagia following cerebral infarction: Secondary | ICD-10-CM

## 2019-04-03 DIAGNOSIS — Z716 Tobacco abuse counseling: Secondary | ICD-10-CM

## 2019-04-03 DIAGNOSIS — I951 Orthostatic hypotension: Secondary | ICD-10-CM | POA: Diagnosis not present

## 2019-04-03 DIAGNOSIS — R338 Other retention of urine: Secondary | ICD-10-CM | POA: Diagnosis not present

## 2019-04-03 DIAGNOSIS — R1310 Dysphagia, unspecified: Secondary | ICD-10-CM | POA: Diagnosis present

## 2019-04-03 DIAGNOSIS — I671 Cerebral aneurysm, nonruptured: Secondary | ICD-10-CM | POA: Diagnosis not present

## 2019-04-03 DIAGNOSIS — I110 Hypertensive heart disease with heart failure: Secondary | ICD-10-CM | POA: Diagnosis present

## 2019-04-03 DIAGNOSIS — N400 Enlarged prostate without lower urinary tract symptoms: Secondary | ICD-10-CM | POA: Diagnosis present

## 2019-04-03 DIAGNOSIS — Z79899 Other long term (current) drug therapy: Secondary | ICD-10-CM

## 2019-04-03 DIAGNOSIS — F17210 Nicotine dependence, cigarettes, uncomplicated: Secondary | ICD-10-CM | POA: Diagnosis present

## 2019-04-03 DIAGNOSIS — D62 Acute posthemorrhagic anemia: Secondary | ICD-10-CM | POA: Diagnosis present

## 2019-04-03 DIAGNOSIS — E1169 Type 2 diabetes mellitus with other specified complication: Secondary | ICD-10-CM

## 2019-04-03 DIAGNOSIS — I5032 Chronic diastolic (congestive) heart failure: Secondary | ICD-10-CM | POA: Diagnosis present

## 2019-04-03 DIAGNOSIS — Z951 Presence of aortocoronary bypass graft: Secondary | ICD-10-CM

## 2019-04-03 DIAGNOSIS — E1151 Type 2 diabetes mellitus with diabetic peripheral angiopathy without gangrene: Secondary | ICD-10-CM | POA: Diagnosis present

## 2019-04-03 DIAGNOSIS — I63519 Cerebral infarction due to unspecified occlusion or stenosis of unspecified middle cerebral artery: Secondary | ICD-10-CM | POA: Diagnosis not present

## 2019-04-03 DIAGNOSIS — E039 Hypothyroidism, unspecified: Secondary | ICD-10-CM | POA: Diagnosis present

## 2019-04-03 DIAGNOSIS — N179 Acute kidney failure, unspecified: Secondary | ICD-10-CM

## 2019-04-03 DIAGNOSIS — E785 Hyperlipidemia, unspecified: Secondary | ICD-10-CM | POA: Diagnosis present

## 2019-04-03 DIAGNOSIS — I663 Occlusion and stenosis of cerebellar arteries: Secondary | ICD-10-CM | POA: Diagnosis present

## 2019-04-03 DIAGNOSIS — R7303 Prediabetes: Secondary | ICD-10-CM | POA: Diagnosis present

## 2019-04-03 DIAGNOSIS — E46 Unspecified protein-calorie malnutrition: Secondary | ICD-10-CM

## 2019-04-03 DIAGNOSIS — Z823 Family history of stroke: Secondary | ICD-10-CM

## 2019-04-03 DIAGNOSIS — N401 Enlarged prostate with lower urinary tract symptoms: Secondary | ICD-10-CM | POA: Diagnosis not present

## 2019-04-03 DIAGNOSIS — E8809 Other disorders of plasma-protein metabolism, not elsewhere classified: Secondary | ICD-10-CM

## 2019-04-03 DIAGNOSIS — I513 Intracardiac thrombosis, not elsewhere classified: Secondary | ICD-10-CM | POA: Diagnosis present

## 2019-04-03 DIAGNOSIS — R269 Unspecified abnormalities of gait and mobility: Secondary | ICD-10-CM | POA: Diagnosis not present

## 2019-04-03 DIAGNOSIS — E669 Obesity, unspecified: Secondary | ICD-10-CM

## 2019-04-03 LAB — GLUCOSE, CAPILLARY
Glucose-Capillary: 82 mg/dL (ref 70–99)
Glucose-Capillary: 109 mg/dL — ABNORMAL HIGH (ref 70–99)
Glucose-Capillary: 85 mg/dL (ref 70–99)
Glucose-Capillary: 115 mg/dL — ABNORMAL HIGH (ref 70–99)

## 2019-04-03 MED ORDER — TRAMADOL HCL 50 MG PO TABS
50.0000 mg | ORAL_TABLET | Freq: Four times a day (QID) | ORAL | Status: DC | PRN
  Administered 2019-04-17 – 2019-04-22 (×7): 50 mg via ORAL
  Filled 2019-04-03 (×9): qty 1

## 2019-04-03 MED ORDER — TICAGRELOR 90 MG PO TABS: 90.0000 mg | ORAL_TABLET | Freq: Two times a day (BID) | ORAL | 0 refills | Status: DC

## 2019-04-03 MED ORDER — INSULIN ASPART 100 UNIT/ML ~~LOC~~ SOLN: 0.0000 [IU] | Freq: Three times a day (TID) | SUBCUTANEOUS | 11 refills | Status: DC

## 2019-04-03 MED ORDER — TAMSULOSIN HCL 0.4 MG PO CAPS
0.4000 mg | ORAL_CAPSULE | Freq: Every day | ORAL | Status: DC
  Administered 2019-04-04 – 2019-04-11 (×8): 0.4 mg via ORAL
  Filled 2019-04-03 (×8): qty 1

## 2019-04-03 MED ORDER — SORBITOL 70 % SOLN
30.0000 mL | Freq: Every day | Status: DC | PRN
  Administered 2019-04-04 – 2019-04-22 (×4): 30 mL via ORAL
  Filled 2019-04-03 (×4): qty 30

## 2019-04-03 MED ORDER — APIXABAN 5 MG PO TABS: 5.0000 mg | ORAL_TABLET | Freq: Two times a day (BID) | ORAL | 0 refills | Status: DC

## 2019-04-03 MED ORDER — ATORVASTATIN CALCIUM 80 MG PO TABS: 80.0000 mg | ORAL_TABLET | Freq: Every day | ORAL | 0 refills | Status: DC

## 2019-04-03 MED ORDER — LEVOTHYROXINE SODIUM 100 MCG PO TABS
100.0000 ug | ORAL_TABLET | Freq: Every day | ORAL | Status: DC
  Administered 2019-04-04 – 2019-04-24 (×21): 100 ug via ORAL
  Filled 2019-04-03 (×21): qty 1

## 2019-04-03 MED ORDER — APIXABAN 5 MG PO TABS
5.0000 mg | ORAL_TABLET | Freq: Two times a day (BID) | ORAL | Status: DC
  Administered 2019-04-03 – 2019-04-24 (×42): 5 mg via ORAL
  Filled 2019-04-03 (×42): qty 1

## 2019-04-03 MED ORDER — LEVOTHYROXINE SODIUM 100 MCG PO TABS: 100.0000 ug | ORAL_TABLET | Freq: Every day | ORAL | 0 refills | Status: DC

## 2019-04-03 MED ORDER — PANTOPRAZOLE SODIUM 40 MG PO TBEC: 40.0000 mg | DELAYED_RELEASE_TABLET | Freq: Every day | ORAL | 0 refills | Status: DC

## 2019-04-03 MED ORDER — ATORVASTATIN CALCIUM 80 MG PO TABS
80.0000 mg | ORAL_TABLET | Freq: Every day | ORAL | Status: DC
  Administered 2019-04-03 – 2019-04-23 (×21): 80 mg via ORAL
  Filled 2019-04-03 (×21): qty 1

## 2019-04-03 MED ORDER — TAMSULOSIN HCL 0.4 MG PO CAPS: 0.4000 mg | ORAL_CAPSULE | Freq: Every day | ORAL | 0 refills | Status: DC

## 2019-04-03 MED ORDER — CHLORHEXIDINE GLUCONATE 0.12% ORAL RINSE (MEDLINE KIT): 15.0000 mL | Freq: Two times a day (BID) | OROMUCOSAL | 0 refills | Status: DC

## 2019-04-03 MED ORDER — TICAGRELOR 90 MG PO TABS
90.0000 mg | ORAL_TABLET | Freq: Two times a day (BID) | ORAL | Status: DC
  Administered 2019-04-03 – 2019-04-24 (×42): 90 mg via ORAL
  Filled 2019-04-03 (×42): qty 1

## 2019-04-03 MED ORDER — PANTOPRAZOLE SODIUM 40 MG PO TBEC
40.0000 mg | DELAYED_RELEASE_TABLET | Freq: Every day | ORAL | Status: DC
  Administered 2019-04-03 – 2019-04-23 (×22): 40 mg via ORAL
  Filled 2019-04-03 (×21): qty 1

## 2019-04-03 NOTE — Progress Notes (Signed)
Inpatient Rehabilitation Admissions Coordinator  I contacted Dr. Barth Kirks and she is in agreement to d/c patient to CIR today. I have notified patient at bedside, his Wife, by phone, as well as RN CM , Vida Roller. I will make the arrangements to admit today.  Danne Baxter, RN, MSN Rehab Admissions Coordinator [phone] 04/03/2019 10:34 AM

## 2019-04-03 NOTE — Progress Notes (Signed)
[name], RN  Rehab Admission Coordinator  Physical Medicine and Rehabilitation  PMR Pre-admission  Signed  Date of Service:  04/03/2019 11:12 AM      Related encounter: Admission (Discharged) from 03/20/2019 in [facility]      Signed        Show:Clear all _0 Manual_1 Template_2 Copied  Added by: _3 [name], RN  _4 Hover for details PMR Admission Coordinator Pre-Admission Assessment   Patient: Jeremy Sherman is an 69 y.o., male MRN: [id number] DOB: [birth year] Height: _5  (195.6 cm) Weight: 106.1 kg                                                                                                                                                  Insurance Information HMO:     PPO: yes     PCP:      IPA:      80/20:      OTHER:  PRIMARY: [employer] [place]      Policy#: [id number]      Subscriber: pt CM Name: Merrilyn Puma      Phone#: [phone]     Fax#: [phone] Pre-Cert#: [id number] approved until 04/15/2019      Employer:  Benefits:  Phone #: [phone]     Name: 2/4 Eff. Date: 01/28/2019     Deduct: $3500      Out of Pocket Max: $7000 includes deductible/have met $6881.67      Life Max: none CIR: 70%      SNF: 70% 60 days per year Outpatient: $50 per visit     Co-Pay: 30 visits combined PT and OT; 30 visits SLP Home Health: 70%      Co-Pay: visits limited by medical neccesity DME: 70%     Co-Pay: 30% Providers: in network  SECONDARY: Medicare part A only      Policy#: [id number]      Subscriber: pt Patient's name on his medicare card is listed as "Jeremy Sherman" I had his Medicare added to his account as well as told patient and his wife that they needed to contact Medicare to have his name corrected   Medicaid Application Date:       Case Manager:  Disability Application Date:       Case Worker:    The "Data Collection Information Summary" for patients in Inpatient Rehabilitation Facilities with attached  "Privacy Act [place] Records" was provided and verbally reviewed with: Patient and Family   Emergency Contact Information         Contact Information     Name Relation Home Work [place], [place] [phone]           Current Medical History  Patient Admitting Diagnosis: CVA   History of Present Illness: 69 year old right-handed male with history of hypertension, hyperlipidemia, prediabetes,  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  By report patient had episode of dizziness confusion on 03/17/2019.  He went to his PCP's office became unresponsive hypotensive and diaphoretic.  Blood pressure reportedly lower than 100 he was brought by EMS to [facility].  Patient had recently been hospitalized at [facility] 03/11/2019 to 03/13/2019 for hypertensive urgency.  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  TEE at outside hospital showed left atrial appendage thrombus was placed on intravenous heparin.  He was transferred to [facility] for further evaluation.  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 Oneida Alar 03/26/2019.  Found to have right hemiparesis following procedure.  Follow-up imaging showed early subacute infarct in the right cerebellar peduncle, likely small vessel disease.  Patient did remain intubated through 03/27/2019.  He had been receiving Cleviprex for blood pressure control.  Initially on intravenous heparin transition to Eliquis 5 mg twice daily as well as Brilinta.  Patient with persistent low-grade fever with urinalysis 03/29/2019 + nitrite placed on Levaquin x5 doses that has been completed blood cultures are  pending Currently maintained on a dysphagia #1 thin liquid diet.  Hospital course findings of elevated TSH level 45.808 and placed on Synthroid.    Complete NIHSS TOTAL: 4 Glasgow Coma Scale Score: 15   Past Medical History      Past Medical History:  Diagnosis Date  . CAD (coronary artery disease)    . Carotid stenosis, left      L ICA 50%  . Hyperlipidemia    . Hypertension    . Hypertensive urgency 03/11/2019  . 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 Prediabetes        Family History  family history includes Stroke in his mother.   Prior Rehab/Hospitalizations:  Has the patient had prior rehab or hospitalizations prior to admission? Yes   Has the patient had major surgery during 100 days prior to admission? Yes   Current Medications    Current Facility-Administered Medications:  .  0.9 %  sodium chloride infusion, 250 mL, Intravenous, Continuous, Kipp Brood, MD, Stopped at 03/26/19 1727 .  apixaban (ELIQUIS) tablet 5 mg, 5 mg, Oral, BID, Brand Males, MD, 5 mg at 04/03/19 0952 .  atorvastatin (LIPITOR) tablet 80 mg, 80 mg, Oral, q1800, Dessa Phi, DO, 80 mg at 04/02/19 1721 .  chlorhexidine gluconate (MEDLINE KIT) (PERIDEX) 0.12 % solution 15 mL, 15 mL, Mouth Rinse, BID, Erick Colace, NP, 15 mL at 04/03/19 0952 .  Chlorhexidine Gluconate Cloth 2 % PADS 6 each, 6 each, Topical, Daily, Oswald Hillock, MD, 6 each at 04/03/19 [phone] .  hydrALAZINE (APRESOLINE) injection 10-40 mg, 10-40 mg, Intravenous, Q4H PRN, Brand Males, MD, 20 mg at 04/02/19 0338 .  insulin aspart (novoLOG) injection 0-9 Units, 0-9 Units, Subcutaneous, TID WC, Dessa Phi, DO, 1 Units at 04/02/19 1258 .  levothyroxine (SYNTHROID) tablet 100 mcg, 100 mcg, Oral, Q0600, Dessa Phi, DO, 100 mcg at 04/03/19 0615 .  pantoprazole (PROTONIX) EC tablet 40 mg, 40 mg, Oral, QHS, Ramaswamy, Murali, MD, 40 mg at 04/02/19 2243 .  sodium chloride flush (NS) 0.9 % injection 10-40 mL, 10-40 mL,  Intracatheter, Q12H, Kipp Brood, MD, 10 mL at 04/02/19 2244 .  sodium chloride flush (NS) 0.9 % injection 10-40 mL, 10-40 mL, Intracatheter, PRN, Agarwala, Ravi, MD .  sodium chloride flush (NS) 0.9 % injection 10-40 mL, 10-40 mL, Intracatheter, Q12H, Agarwala, Ravi, MD, 10 mL at 04/03/19 0952 .  sodium chloride flush (NS) 0.9 % injection 10-40 mL, 10-40 mL, Intracatheter, PRN, Agarwala, Ravi, MD, 20 mL at 04/02/19 1504 .  tamsulosin (FLOMAX) capsule 0.4 mg, 0.4 mg, Oral, Daily, Dessa Phi, DO, 0.4 mg at 04/03/19 6945 .  [COMPLETED] ticagrelor (BRILINTA) tablet 180 mg, 180 mg, Oral, Once, 180 mg at 03/27/19 0857 **FOLLOWED BY** ticagrelor (BRILINTA) tablet 90 mg, 90 mg, Oral, BID, Agarwala, Ravi, MD, 90 mg at 04/03/19 0952 .  traMADol (ULTRAM) tablet 50 mg, 50 mg, Oral, Q6H PRN, Dessa Phi, DO   Patients Current Diet:     Diet Order                      DIET DYS 2 Room service appropriate? Yes; Fluid consistency: Thin  Diet effective now                   Precautions / Restrictions Precautions Precautions: Fall Precaution Comments: bed rest orders in - spoke with Dr. Chase Caller today, pt appropriate for OOB Restrictions Weight Bearing Restrictions: No    Has the patient had 2 or more falls or a fall with injury in the past year?No   Prior Activity Level Community (5-7x/wk): Independent and working as  a Dealer ; driving   Prior Functional Level Prior Function Level of Independence: Independent Comments: drives   Self Care: Did the patient need help bathing, dressing, using the toilet or eating?  Independent   Indoor Mobility: Did the patient need assistance with walking from room to room (with or without device)? Independent   Stairs: Did the patient need assistance with internal or external stairs (with or without device)? Independent   Functional Cognition: Did the patient need help planning regular tasks such as shopping or remembering to take medications?  Independent   Home Assistive Devices / Equipment Home Assistive Devices/Equipment: Cane (specify quad or straight) Home Equipment: Cane - single point   Prior Device Use: Indicate devices/aids used by the patient prior to current illness, exacerbation or injury? None of the above   Current Functional Level Cognition   Arousal/Alertness: Awake/alert Overall Cognitive Status: Impaired/Different from baseline Current Attention Level: Sustained Orientation Level: Oriented to person, Oriented to place, Oriented to time, Oriented to situation Following Commands: Follows one step commands consistently, Follows one step commands with increased time Safety/Judgement: Decreased awareness of safety, Decreased awareness of deficits General Comments: pt reports he is in [employer] initially, later in session reports he is at [facility], therapists reoriented pt;he continues to require vc to initiate task, problem solving and safety awareness;consistent cues for safe hand placement and safe use of RW;grunting frequently during session but denies pain; Attention: Focused, Sustained Focused Attention: Appears intact Sustained Attention: Appears intact Memory: Impaired Memory Impairment: Decreased short term memory Decreased Short Term Memory: Verbal basic Problem Solving: Impaired Problem Solving Impairment: Verbal complex Executive Function: Reasoning Reasoning: Impaired Reasoning Impairment: Verbal complex    Extremity Assessment (includes Sensation/Coordination)   Upper Extremity Assessment: Generalized weakness  Lower Extremity Assessment: Generalized weakness     ADLs   Overall ADL's : Needs assistance/impaired Eating/Feeding: Set up, Sitting Grooming: Cueing for safety, Minimal assistance, Sitting Grooming Details (indicate  cue type and reason): attempted to complete in standing, pt required cue to initiate sitting Upper Body Bathing: Moderate assistance, Sitting Lower  Body Bathing: Maximal assistance, Sit to/from stand Upper Body Dressing : Moderate assistance, Sitting Lower Body Dressing: Maximal assistance, Sit to/from stand Toilet Transfer: Moderate assistance, +2 for physical assistance, +2 for safety/equipment, RW Toilet Transfer Details (indicate cue type and reason): simulated, ambulated 43fet with modA+2 to stand, modA+1 for physical assistance with ambulation and +1 for chair follow Toileting- Clothing Manipulation and Hygiene: Moderate assistance, +2 for physical assistance, +2 for safety/equipment, Cueing for safety Toileting - Clothing Manipulation Details (indicate cue type and reason): maxA for sit<>stand Tub/ Shower Transfer: Min guard, 3 in 1, Ambulation Tub/Shower Transfer Details (indicate cue type and reason): education given on use of 3:1 in tub shower Functional mobility during ADLs: Moderate assistance, +2 for physical assistance, +2 for safety/equipment, Rolling walker General ADL Comments: ambulated to sink with need for chair follow, unable to maintain standing balance at sink level due to reliance on BUE support on external surface     Mobility   Overal bed mobility: Needs Assistance Bed Mobility: Rolling, Sidelying to Sit Rolling: Mod assist Sidelying to sit: Mod assist, +2 for safety/equipment Supine to sit: Min assist Sit to supine: Max assist, +2 for physical assistance, +2 for safety/equipment General bed mobility comments: Pt required assistance to roll to R side push with LLE and reach with LUE for bed rail.  Cues to open eyes to look for railing.  Once in side lying he required assistance to elevate trunk into sitting and advance LEs to edge of bed.  HOB elevated to improve ease.     Transfers   Overall transfer level: Needs assistance Equipment used: Rolling walker (2 wheeled) Transfers: Sit to/from Stand Sit to Stand: +2 physical assistance, +2 safety/equipment, Mod assist Stand pivot transfers: Min assist General  transfer comment: Cues for hand placement as he attempts to pull into standing with hand grips on RW.  Onc e in standing remains with flexed hips.  Pt's RW too short.  Weakness noted on R side.     Ambulation / Gait / Stairs / Wheelchair Mobility   Ambulation/Gait Ambulation/Gait assistance: Mod assist, +2 safety/equipment Gait Distance (Feet): 8 Feet(+ 40 ft) Assistive device: Rolling walker (2 wheeled) Gait Pattern/deviations: Wide base of support, Decreased stride length, Trunk flexed, Step-through pattern, Drifts right/left General Gait Details: Pt noted with drifting to the R due to weakness and poor safety when using RW.  Cues to keep RW close to his body. Pt fatigue and noted with DOE. Gait velocity: decr Gait velocity interpretation: <1.31 ft/sec, indicative of household ambulator Stairs: Yes Stairs assistance: Min assist Stair Management: One rail Left, Forwards, Alternating pattern Number of Stairs: 5 General stair comments: safe with use of rails     Posture / Balance Dynamic Sitting Balance Sitting balance - Comments: demonstrated preference for single UE support due to dizziness, able to sit without UE support Balance Overall balance assessment: Needs assistance Sitting-balance support: Feet supported, No upper extremity supported Sitting balance-Leahy Scale: Fair Sitting balance - Comments: demonstrated preference for single UE support due to dizziness, able to sit without UE support Standing balance support: Bilateral upper extremity supported, During functional activity Standing balance-Leahy Scale: Poor Standing balance comment: heavy reliance on external support, unable to stand upright without UE support     Special needs/care consideration BiPAP/CPAP CPM Continuous Drip IV Dialysis        Life Vest  Oxygen Special Bed Trach Size Wound Vac  Skin                          Bowel mgmt:incontinent Bladder mgmt: Coude 16 FR place 1/30 Diabetic mgmt Hgb A1c  6.4 Behavioral consideration  Chemo/radiation  Designated visitor is wife, Mee Hives    Previous Home Environment  Living Arrangements: Spouse/significant other  Lives With: Spouse Available Help at Discharge: Family, Available 24 hours/day Type of Home: House Home Layout: One level Home Access: Stairs to enter Entrance Stairs-Rails: None Technical brewer of Steps: 4 Bathroom Shower/Tub: Optometrist: Yes How Accessible: Accessible via walker Home Care Services: No Additional Comments: works full time as a Automotive engineer for Discharge Living Setting: Patient's home, Lives with (comment)(wife) Type of Home at Discharge: House Discharge Home Layout: One level Discharge Home Access: Stairs to enter Entrance Stairs-Rails: None Entrance Stairs-Number of Steps: 4 Discharge Bathroom Shower/Tub: Tub/shower unit Discharge Bathroom Toilet: Standard Discharge Bathroom Accessibility: Yes How Accessible: Accessible via walker Does the patient have any problems obtaining your medications?: No   Social/Family/Support Systems Patient Roles: Spouse, Parent(employee) Contact Information: wife, Mee Hives Anticipated Caregiver: wife Anticipated Caregiver's Contact Information: [phone] Ability/Limitations of Caregiver: no limitations Caregiver Availability: 24/7 Discharge Plan Discussed with Primary Caregiver: Yes Is Caregiver In Agreement with Plan?: Yes Does Caregiver/Family have Issues with Lodging/Transportation while Pt is in Rehab?: No   Goals/Additional Needs Patient/Family Goal for Rehab: supervision to min with PT and OT, Mod I to supervision with SLP Expected length of stay: ELOS 12 to 17 days Pt/Family Agrees to Admission and willing to participate: Yes Program Orientation Provided & Reviewed with Pt/Caregiver Including Roles  & Responsibilities: Yes   Decrease burden of Care through IP  rehab admission:    Possible need for SNF placement upon discharge:   Patient Condition: This patient's medical and functional status has changed since the consult dated: 03/28/2019 in which the Rehabilitation Physician determined and documented that the patient's condition is appropriate for intensive rehabilitative care in an inpatient rehabilitation facility. See "History of Present Illness" (above) for medical update. Functional changes are: mod assist. Patient's medical and functional status update has been discussed with the Rehabilitation physician and patient remains appropriate for inpatient rehabilitation. Will admit to inpatient rehab today.   Preadmission Screen Completed By:  Cleatrice Burke, RN, 04/03/2019 11:22 AM ______________________________________________________________________   Discussed status with Dr. Dagoberto Ligas on 04/03/2019 at  1123 and received approval for admission today.   Admission Coordinator:  Cleatrice Burke, time 4720 Date 04/03/2019         Cosigned by: Courtney Heys, MD at 04/03/2019 11:25 AM  Revision History

## 2019-04-03 NOTE — Discharge Summary (Addendum)
Physician Discharge Summary  Luverne Zerkle Gesell ZSW:[id number] DOB: 12-Sep-1950 DOA: 03/20/2019  PCP: Imagene Riches, NP  Admit date: 03/20/2019 Discharge date: 04/03/2019  Admitted From: Home Disposition: Inpatient rehab  Recommendations for Outpatient Follow-up:  1. Follow up with PCP in 2 weeks 2. Please obtain BMP/CBC in one week 3. 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  He then presented to [facility] on 03/17/2019 for dizziness and syncope.  Work-up as below:   CT brain 03/17/19: 8x24m focal slight dense lesion in the left and anterior callosal region.   MRI brain 03/17/19: Early subacute infarct within the right cerebellum and right brachium pontis. Punctate acute left parietal lobe cortical infarct. [id number] focus of enhancement in the pericallosal region w imaging features most suggestive of ACA pericallosal aneurysm, CTA recommended. Chronic lacunar infarct in the right caudate nucleus  CTA head/neck: 4x7 mm anuerysm left pericallosal segment left ACA without evidence of rupture. Mild stenosis of right M1 segment, Moderate to severe stenosis left M1 segment poststenotic dilation. Mild stenoss in the PCA bilaterally. Atherosclerotic disease, mural thrombus in the prox left ICA narrowing the lumen by 50%. Eccentric atherosclerotic dsiease vs focal dissection in the left ICA below the skull base without significant stenosis  TEE 03/20/19: EF 45-50% , left atrial appendage  thrombus, and mild MR  He underwent left common carotid artery angiogram followed by stent angioplasty of left MCM M1 segment. He was admitted to ICU post-procedure, then subsequently extubated and transferred back to hospitalist service 03/29/2019.  Since transfer he has been stable except for low-grade fever.  Chest x-ray has been normal.  UA and urine cultures were negative.  Patient has urinary retention and required indwelling catheter.  Following is the hospital course in the form of problem list: Left MCA stenosis and aneurysm, left ACA pericallosal segment aneurysm, left ICA proximal stenosis  -S/p left common carotid artery angiogram, stent angioplasty left MCA, stent assisted coiling of the left ACA pericallosal aneurysm 03/26/2019 -Continue Eliquis, brilinta  -Follow up with Dr. DeEstanislado Pandyn 3 weeks after discharge  -Follow up with Dr. SeLeonie Mann 4 weeks after discharge   Acute respiratory failure, hypoxemic, post-procedure -Now extubated and remains on room air   Fever -Completed levaquin for presumed UTI -Fever again 04/01/19. Blood culture NGTD - CXR and CBC normal. -Urine cultures no growth. -Appears to be stable at this time.  Denies having any complaints.  Please continue to monitor.   Left atrial appendage thrombus -Continue Eliquis  -Follow up with cardiology   HLD -Continue lipitor   Hypothyroidism -Continue Synthroid  Acute kidney injury -Baseline Cr ~1 -Resolved  DM type 2, well controlled -Ha1c 6.4 -SSI   Right foot ischemia, PVD -Vascular surgery Dr. FiEden Latheas seen the patient and ordered duplex ultrasound of the right lower extremity with bilateral ABIs. He has severe PAD, chronic. Patient is asymptomatic at this time. No intervention planned in the hospital. Patient to follow-up as outpatient.  BPH -Previous hospitalist discussed with Dr. BeLouis Meckelwho recommends to continue with Flomax at this time and consider voiding trial once  patient is more stable. If patient is unable to urinate, reinsert catheter and follow-up urology in 2 weeks after discharge. -Coude catheter removed but had urinary retention again. Replace coude catheter and plan to follow up with urology outpatient.   Patient being discharged to inpatient rehab in a stable condition.  Answered all of the patient's questions appropriately to the best of my knowledge.  He verbalized understanding.  Discharge Diagnoses:  Active Problems:   Stroke [facility])   Carotid stenosis   CAD (coronary artery disease)   Brain aneurysm   Middle cerebral artery stenosis   Aneurysm of anterior cerebral artery   Essential hypertension   Prediabetes   Dysphagia, post-stroke   Thrombus of left atrial appendage    Discharge Instructions  Discharge Instructions    Ambulatory referral to Neurology   Complete by: As directed    Follow up with [facility] Leonie [facility] in 4-6 weeks. Too complicated for NP to follow. Thanks.     Allergies as of 04/03/2019   No Known Allergies     Medication List    STOP taking these medications   aspirin EC 81 MG tablet   omega-3 acid ethyl esters 1 g capsule Commonly known as: LOVAZA     TAKE these medications   apixaban 5 MG Tabs tablet Commonly known as: ELIQUIS Take 1 tablet (5 mg total) by mouth 2 (two) times daily.   atorvastatin 80 MG tablet Commonly known as: LIPITOR Take 1 tablet (80 mg total) by mouth daily at 6 PM.   chlorhexidine gluconate (MEDLINE KIT) 0.12 % solution Commonly known as: PERIDEX 15 mLs by Mouth Rinse route 2 (two) times daily.   insulin aspart 100 UNIT/ML injection Commonly known as: novoLOG Inject 0-9 Units into the skin 3 (three) times daily with meals.   levothyroxine 100 MCG tablet Commonly known as: SYNTHROID Take 1 tablet (100 mcg total) by mouth daily at 6 (six) AM. Start taking on: April 04, 2019   pantoprazole 40 MG tablet Commonly known as: PROTONIX Take 1 tablet (40 mg total) by  mouth at bedtime.   tamsulosin 0.4 MG Caps capsule Commonly known as: FLOMAX Take 1 capsule (0.4 mg total) by mouth daily. Start taking on: April 04, 2019   ticagrelor 90 MG Tabs tablet Commonly known as: BRILINTA Take 1 tablet (90 mg total) by mouth 2 (two) times daily.      Follow-up Information    Garvin Fila, MD. Schedule an appointment as soon as possible for a visit in 4 week(s).   Specialties: Neurology, Radiology Contact information: [address] [place] [address] [phone]        Luanne Bras, MD Follow up in 3 week(s).   Specialties: Interventional Radiology, Radiology Why: Please follow-up with Dr. Estanislado Pandy in clinic 3 weeks after discharge. Our office will call you to set up this appointment. Contact information: [place] [address] [phone]          No Known Allergies  Consultations:  Neurology  Vascular surgery  IR  PCCM   Procedures/Studies: CT ANGIO HEAD W OR WO CONTRAST  Result Date: 03/26/2019 CLINICAL DATA:  Follow-up examination for acute stroke, recent ICA and MCA stenting and aneurysm coiling EXAM: CT ANGIOGRAPHY HEAD AND NECK CT PERFUSION BRAIN TECHNIQUE: Multidetector CT imaging of the head and neck was performed using the standard protocol during bolus administration of intravenous contrast. Multiplanar CT image reconstructions and MIPs were obtained to evaluate the vascular anatomy.  Carotid stenosis measurements (when applicable) are obtained utilizing NASCET criteria, using the distal internal carotid diameter as the denominator. Multiphase CT imaging of the brain was performed following IV bolus contrast injection. Subsequent parametric perfusion maps were calculated using RAPID software. CONTRAST:  173m OMNIPAQUE IOHEXOL 350 MG/ML SOLN COMPARISON:  Comparison made with prior CTA from 03/19/2019. FINDINGS: CT HEAD FINDINGS Brain: Generalized age-related cerebral atrophy with chronic  small vessel ischemic disease. No acute intracranial hemorrhage. No acute large vessel territory infarct. No mass lesion, midline shift or mass effect. No hydrocephalus. No extra-axial fluid collection. 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 Vascular stent has been placed within the left M1 segment. No hyperdense vessel. Scattered vascular calcifications noted within the carotid siphons. Skull: Scalp soft tissues within normal limits.  Calvarium intact. Sinuses/Orbits: Globes and orbital soft tissues within normal limits. Scattered mucosal thickening noted within the ethmoidal air cells and right maxillary sinus. Few small air-fluid levels noted. Mastoid air cells are clear. Patient is intubated. Other: None. CTA NECK FINDINGS Aortic arch: Visualized aortic arch of normal caliber. Bovine arch with common origin of the right brachiocephalic and left common carotid artery noted. Extensive noncalcified plaque seen throughout the visualized arch and about the origin of the great vessels without hemodynamically significant stenosis. Irregular soft plaque and/or thrombus protruding into the lumen at the origin of the right brachiocephalic artery (series 12, image 38), unchanged. 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 Subclavian arteries otherwise irregular but patent without flow-limiting stenosis. Right carotid system: Right common carotid artery patent from its origin to the bifurcation without stenosis. Mild scattered plaque about the right bifurcation/proximal right ICA without hemodynamically significant stenosis. Right ICA irregular but patent to the skull base without stenosis, dissection, or occlusion. Left carotid system: Multifocal atheromatous irregularity within the left common carotid artery without stenosis. Interval placement of a vascular stent, proximal aspect at the origin of the left ICA. Mild-to-moderate stenoses involving the  mid and distal aspect of the stent, measuring up to approximately 50% by NASCET criteria. Widely patent flow otherwise seen through the stent. No intraluminal thrombus or other complication. Left ICA irregular but otherwise widely patent to the skull base without stenosis, dissection, or occlusion. Vertebral arteries: Both vertebral arteries arise from the subclavian arteries. Vertebral arteries remain widely patent within the neck without stenosis, dissection or occlusion. Skeleton: No acute osseous abnormality. No discrete osseous lesions. Moderate cervical spondylosis noted at C5-6. Patient is edentulous. Other neck: Endotracheal and enteric tubes in place. Postoperative changes from recent cutdown and open exposure of the left common carotid artery seen at the lower anterior left neck. Percutaneous drain remains in place within this region with associated scattered foci of soft tissue emphysema. Associated postoperative swelling and blood products present within this region as well. Small linear contrast blush within this region adjacent to the surgical drain likely reflects a small amount of persistent bleeding, likely venous in nature (series 5, image 36). No other active contrast extravasation. Upper chest: Layering bilateral pleural effusions with associated atelectasis partially visualized. Paraseptal emphysematous changes noted at the lung apices. Median sternotomy partially visualized. Review of the MIP images confirms the above findings CTA HEAD FINDINGS Anterior circulation: Petrous segments are widely patent. Scattered atherosclerotic change throughout the carotid siphons with associated moderate multifocal narrowing, unchanged. Left A1 widely patent. Hypoplastic right A1. Normal anterior communicating artery. Partially azygos ACA noted. 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 No visible neck remnant identified.  Grossly patent flow through the adjacent stent. Interval  placement of a vascular stent across the previously seen severe left M1 stenosis. Patent flow is seen through the stent. Left MCA branches perfused distally. Mild stenosis involving the proximal right M1 segment noted, stable. Distal right MCA branches well perfused and stable from previous. Posterior circulation: Vertebral arteries remain widely patent to the vertebrobasilar junction. Posterior inferior cerebral arteries patent bilaterally. Basilar diffusely diminutive with associated mild multifocal narrowing. Superior cerebral arteries patent bilaterally. PCA supplied via hypoplastic P1 segments as well as robust bilateral posterior communicating arteries. Prominent atherosclerotic change throughout both PCAs with associated moderate to severe multifocal stenoses, right worse than left, grossly stable. Venous sinuses: Grossly patent allowing for timing of the contrast bolus Anatomic variants: Predominant fetal type origin of the PCAs. Hypoplastic right A1 segment. Review of the MIP images confirms the above findings CT Brain Perfusion Findings: CBF (<30%) Volume: 38m Perfusion (Tmax>6.0s) volume: 049mMismatch Volume: 72m272mnfarction Location:Negative CT perfusion for acute ischemia. On source perfusion maps, there is increased cerebral blood volume and cerebral blood flow with mildly decreased mean transit time and T-max, likely reflecting improved cerebrovascular flow to the left cerebral hemisphere due to interval stenting of the left ICA and MCA. No other perfusion abnormality. IMPRESSION: CT HEAD IMPRESSION: 1. No acute intracranial abnormality. 2. 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 3. 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 CTA HEAD AND NECK IMPRESSION: 1. Negative CTA for emergent large vessel occlusion. 2. Interval stenting at the proximal left ICA and left M1 segment without complication. Patent flow seen through both stents. 3.  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 No residual neck remnant identified. 4. Postoperative changes from interval cutdown for left carotid artery exposure/access. Surgical drain remains in place. Small blush of contrast adjacent to the drain consistent with a small focus of active bleeding, likely venous in nature. 5. Otherwise stable CTA with extensive atherosclerotic change elsewhere throughout the major arterial vasculature of the head and neck. CT PERFUSION IMPRESSION: 1. Negative CT perfusion for acute core infarct. 2. Increased cerebral blood flow and blood volume with decreased T-max within the left cerebral hemisphere, reflecting improved vascular flow to the left MCA distribution due to the left ICA and MCA stents. 3. Otherwise negative CT perfusion, with no other perfusion abnormality. These results were communicated to Dr. AroLorraine Lax 9:30 pmon 1/27/2021by text page via the [facility] system. Electronically Signed   By: BenJeannine BogaD.   On: 03/26/2019 22:49   DG Chest 2 View  Result Date: 04/02/2019 CLINICAL DATA:  Acute onset fever today. EXAM: CHEST - 2 VIEW COMPARISON:  Single-view of the chest 03/28/2019. FINDINGS: Right PICC is unchanged with its tip near the superior cavoatrial junction. Lungs are clear. Marked cardiomegaly. No pneumothorax or pleural fluid. No acute or focal bony abnormality. IMPRESSION: No acute disease. Marked cardiomegaly. Electronically Signed   By: ThoInge RiseD.   On: 04/02/2019 11:13   CT ANGIO NECK W OR WO CONTRAST  Result Date: 03/26/2019 CLINICAL DATA:  Follow-up examination for acute stroke, recent ICA and MCA stenting and aneurysm coiling EXAM: CT ANGIOGRAPHY HEAD AND NECK CT PERFUSION BRAIN TECHNIQUE: Multidetector CT imaging of the head and neck was performed using the standard protocol during bolus administration of intravenous contrast. Multiplanar CT image reconstructions and MIPs were obtained  to evaluate the vascular anatomy. Carotid stenosis measurements (when applicable) are obtained utilizing NASCET  criteria, using the distal internal carotid diameter as the denominator. Multiphase CT imaging of the brain was performed following IV bolus contrast injection. Subsequent parametric perfusion maps were calculated using RAPID software. CONTRAST:  122m OMNIPAQUE IOHEXOL 350 MG/ML SOLN COMPARISON:  Comparison made with prior CTA from 03/19/2019. FINDINGS: CT HEAD FINDINGS Brain: Generalized age-related cerebral atrophy with chronic small vessel ischemic disease. No acute intracranial hemorrhage. No acute large vessel territory infarct. No mass lesion, midline shift or mass effect. No hydrocephalus. No extra-axial fluid collection. 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 Vascular stent has been placed within the left M1 segment. No hyperdense vessel. Scattered vascular calcifications noted within the carotid siphons. Skull: Scalp soft tissues within normal limits.  Calvarium intact. Sinuses/Orbits: Globes and orbital soft tissues within normal limits. Scattered mucosal thickening noted within the ethmoidal air cells and right maxillary sinus. Few small air-fluid levels noted. Mastoid air cells are clear. Patient is intubated. Other: None. CTA NECK FINDINGS Aortic arch: Visualized aortic arch of normal caliber. Bovine arch with common origin of the right brachiocephalic and left common carotid artery noted. Extensive noncalcified plaque seen throughout the visualized arch and about the origin of the great vessels without hemodynamically significant stenosis. Irregular soft plaque and/or thrombus protruding into the lumen at the origin of the right brachiocephalic artery (series 12, image 38), unchanged. 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 Subclavian arteries otherwise irregular but patent without flow-limiting stenosis. Right carotid  system: Right common carotid artery patent from its origin to the bifurcation without stenosis. Mild scattered plaque about the right bifurcation/proximal right ICA without hemodynamically significant stenosis. Right ICA irregular but patent to the skull base without stenosis, dissection, or occlusion. Left carotid system: Multifocal atheromatous irregularity within the left common carotid artery without stenosis. Interval placement of a vascular stent, proximal aspect at the origin of the left ICA. Mild-to-moderate stenoses involving the mid and distal aspect of the stent, measuring up to approximately 50% by NASCET criteria. Widely patent flow otherwise seen through the stent. No intraluminal thrombus or other complication. Left ICA irregular but otherwise widely patent to the skull base without stenosis, dissection, or occlusion. Vertebral arteries: Both vertebral arteries arise from the subclavian arteries. Vertebral arteries remain widely patent within the neck without stenosis, dissection or occlusion. Skeleton: No acute osseous abnormality. No discrete osseous lesions. Moderate cervical spondylosis noted at C5-6. Patient is edentulous. Other neck: Endotracheal and enteric tubes in place. Postoperative changes from recent cutdown and open exposure of the left common carotid artery seen at the lower anterior left neck. Percutaneous drain remains in place within this region with associated scattered foci of soft tissue emphysema. Associated postoperative swelling and blood products present within this region as well. Small linear contrast blush within this region adjacent to the surgical drain likely reflects a small amount of persistent bleeding, likely venous in nature (series 5, image 36). No other active contrast extravasation. Upper chest: Layering bilateral pleural effusions with associated atelectasis partially visualized. Paraseptal emphysematous changes noted at the lung apices. Median sternotomy  partially visualized. Review of the MIP images confirms the above findings CTA HEAD FINDINGS Anterior circulation: Petrous segments are widely patent. Scattered atherosclerotic change throughout the carotid siphons with associated moderate multifocal narrowing, unchanged. Left A1 widely patent. Hypoplastic right A1. Normal anterior communicating artery. Partially azygos ACA noted. 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 No visible neck remnant identified. Grossly patent flow through the adjacent stent. Interval placement  of a vascular stent across the previously seen severe left M1 stenosis. Patent flow is seen through the stent. Left MCA branches perfused distally. Mild stenosis involving the proximal right M1 segment noted, stable. Distal right MCA branches well perfused and stable from previous. Posterior circulation: Vertebral arteries remain widely patent to the vertebrobasilar junction. Posterior inferior cerebral arteries patent bilaterally. Basilar diffusely diminutive with associated mild multifocal narrowing. Superior cerebral arteries patent bilaterally. PCA supplied via hypoplastic P1 segments as well as robust bilateral posterior communicating arteries. Prominent atherosclerotic change throughout both PCAs with associated moderate to severe multifocal stenoses, right worse than left, grossly stable. Venous sinuses: Grossly patent allowing for timing of the contrast bolus Anatomic variants: Predominant fetal type origin of the PCAs. Hypoplastic right A1 segment. Review of the MIP images confirms the above findings CT Brain Perfusion Findings: CBF (<30%) Volume: 41m Perfusion (Tmax>6.0s) volume: 028mMismatch Volume: 56m58mnfarction Location:Negative CT perfusion for acute ischemia. On source perfusion maps, there is increased cerebral blood volume and cerebral blood flow with mildly decreased mean transit time and T-max, likely reflecting improved cerebrovascular flow to the  left cerebral hemisphere due to interval stenting of the left ICA and MCA. No other perfusion abnormality. IMPRESSION: CT HEAD IMPRESSION: 1. No acute intracranial abnormality. 2. 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 3. 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 CTA HEAD AND NECK IMPRESSION: 1. Negative CTA for emergent large vessel occlusion. 2. Interval stenting at the proximal left ICA and left M1 segment without complication. Patent flow seen through both stents. 3. 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 No residual neck remnant identified. 4. Postoperative changes from interval cutdown for left carotid artery exposure/access. Surgical drain remains in place. Small blush of contrast adjacent to the drain consistent with a small focus of active bleeding, likely venous in nature. 5. Otherwise stable CTA with extensive atherosclerotic change elsewhere throughout the major arterial vasculature of the head and neck. CT PERFUSION IMPRESSION: 1. Negative CT perfusion for acute core infarct. 2. Increased cerebral blood flow and blood volume with decreased T-max within the left cerebral hemisphere, reflecting improved vascular flow to the left MCA distribution due to the left ICA and MCA stents. 3. Otherwise negative CT perfusion, with no other perfusion abnormality. These results were communicated to Dr. AroLorraine Lax 9:30 pmon 1/27/2021by text page via the [facility]aging system. Electronically Signed   By: BenJeannine BogaD.   On: 03/26/2019 22:49   IR Transcath/Emboliz  Result Date: 04/01/2019 CLINICAL DATA:  Symptomatic high-grade stenosis of the left middle cerebral artery proximal M1 segment with history of recent ischemic stroke. 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 Additionally,  patient with high-grade stenosis of the proximal left internal carotid artery. EXAM: TRANSCATHETER THERAPY EMBOLIZATION COMPARISON:  Diagnostic catheter arteriogram of March 21, 2019. MEDICATIONS: Heparin 1000 units IV. Ancef 4 g IV antibiotic was administered within 1 hour of the procedure. ANESTHESIA/SEDATION: General anesthesia. CONTRAST:  Isovue 300 approximately 150 mL. FLUOROSCOPY TIME:  Fluoroscopy Time: 101 minutes 0 seconds (4155 mGy). COMPLICATIONS: None immediate. TECHNIQUE: Informed written consent was obtained from the patient after a thorough discussion of the procedural risks, benefits and alternatives. All questions were addressed. Maximal Sterile Barrier Technique was utilized including caps, mask, sterile gowns, sterile gloves, sterile drape, hand hygiene and skin antiseptic. A timeout was performed prior  to the initiation of the procedure. Direct access to the left common carotid artery was provided by vascular surgery in the OR due to patient's severe peripheral vascular disease, and absent flow in the right radial artery. A 6 French Pinnacle sheath was inserted and sutured in the left common carotid artery in the mid section. The patient was brought to the angio suite with the 6 French Pinnacle sheath. This was then connected to continuous heparinized saline infusion following flushing. An arteriogram was then obtained of the left common carotid bifurcation and also intracranially. FINDINGS: The left common carotid bifurcation again demonstrates the left external carotid artery and its major branches to be widely patent. The left internal carotid artery just distal to the bulb again demonstrates a significant stenosis just distal to the carotid bulb of approximately 70-75%. More distally, the left internal carotid artery is seen to opacify to the cranial skull base. The petrous segment is widely patent. There is a mild to moderate stenosis of the distal cavernous segment of the left internal  carotid artery and to a lesser degree of the supraclinoid left internal carotid artery. A left posterior communicating artery is seen opacifying the left posterior cerebral artery distribution. The left middle cerebral artery again demonstrates a severe M1 segment stenosis with opacification of the left MCA trifurcation branches into the capillary and venous phases. The left anterior cerebral artery demonstrates mild arteriosclerotic changes in the proximal A1 and A2 segments. More distally, mild arteriosclerotic changes are also noted in the A3 segment of the left anterior cerebral artery pericallosal branch. 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 The subsequent capillary and venous phases are unremarkable. ENDOVASCULAR TREATMENT OF THE LEFT ANTERIOR CEREBRAL ARTERY PERICALLOSAL REGION ANEURYSM WITH STENT ASSISTED COILING Initial treatment angioplasty of the left internal carotid approximately was performed following advancement of a 5 mm x 30 mm Viatrac 14 angioplasty microcatheter. This had been prepped and purged with heparinized saline infusion. Over a 014 inch standard Synchro micro guidewire with a J configuration, the combination of the Viatrac 14 angioplasty balloon catheter in the micro guidewire advanced to the proximal left internal carotid artery. Using a torque device, the micro guidewire was advanced without difficulty to the distal cervical segment of the left internal carotid artery. This was then followed by the advancement of the Viatrac 5 x 30 mm balloon in place adequate distance at the site of severe stenosis. Slow control angioplasty was then performed using micro inflation syringe device via micro tubing. Balloon was inflated to 8.3 atmospheres where it was maintained for approximately 1 minute. The balloon was then deflated and advanced more distally. A control arteriogram performed through 5 French  Pinnacle sheath in the left common carotid artery again demonstrated significantly improved caliber and flow through the angioplastied segment. The combination of the balloon and the microcatheter were advanced to the horizontal petrous segment of the left internal carotid artery. The micro guidewire was removed, and the Viatrac balloon was then exchanged for an 014 inch 300 cm Softip Transend exchange guidewire. Over the exchange micro guidewire, a 5 French 115 cm Catalyst guide catheter was advanced and positioned in the horizontal petrous segment of the left internal carotid artery. The exchange micro guidewire was then removed. Good aspiration was obtained from the hub of the 5 [place] Catalyst guide catheter in the left internal carotid artery. A control arteriogram performed through the 5 [place] Catalyst guide  catheter in the left internal carotid artery demonstrated no changes in the intracranial circulation. Over a 0.014 inch standard Synchro micro guidewire with a J configuration, a Headway 17 2 tip microcatheter was advanced without difficulty to the supraclinoid left ICA. The micro guidewire was then gently manipulated with a torque device and advanced through left anterior cerebral artery A1 A2 segments followed by the microcatheter to the A3 segment of the anterior cerebral artery pericallosal branch. The wire was then advanced without difficulty distal to the neck of the aneurysm followed by the microcatheter. The guidewire was removed. Good aspiration obtained from the hub of microcatheter. A gentle control arteriogram performed through the microcatheter demonstrates safe position of tip of the microcatheter. This was then connected to continuous heparinized saline infusion. At this time after having obtained measurements of the pericallosal artery distal and proximal to the aneurysm, a 2.5 mm x 23 mm Lvis junior stent was advanced to the distal end of the microcatheter. Under constant fluoroscopic  guidance using roadmap technique, the delivery microcatheter was retrieved unsheathing the distal and then the proximal Lvis stent. A control arteriogram performed through the 5 [place] Catalyst guide catheter now advanced to the A1 segment of the left anterior cerebral artery demonstrates safe positioning and excellent apposition of the Lvis stent. At this time, the 014 inch standard Synchro micro guidewire was then advanced through the microcatheter into the aneurysm without difficulty followed by the microcatheter. The guidewire was removed. Good aspiration was obtained with the intra aneurysmal microcatheter. This was then connected to continuous heparinized saline infusion. The first coil utilized for aneurysm embolization was a Microplex 10 5 mm x 15 mm HyperSoft 3D coil. This was advanced using biplane roadmap technique and constant fluoroscopic guidance in a coaxial manner and with constant heparinized saline infusion to the distal end of the intra-aneurysmal microcatheter. This coil was then advanced under constant fluoroscopic guidance. Prior to its detachment, a control arteriogram performed through the 5 [place] Catalyst guide catheter in the left anterior cerebral artery demonstrates safe positioning of the coil. This was then detached without difficulty. This was subsequently followed by advancement of a 4 mm x 8 mm HyperSoft 3D coil, a 2.5 mm x 8 cm HyperSoft 3D coil, a 3 mm x 6 cm HyperSoft 3D coil, a 3 mm x 4 cm Microplex HyperSoft 3D coil and finally a 2 mm x 6 cm Microplex HyperSoft helical coil. Each of these coils was advanced into the aneurysm under fluoroscopic guidance using biplane roadmap technique. Prior to their detachment, a control arteriogram was performed through the guide catheter to ensure safe positioning of the coil mass. Following the final coil there was near complete obliteration of the aneurysm. The microcatheter was gently retrieved from the coil mass without any difficulty. A  control arteriogram performed demonstrated near complete occlusion of the aneurysm with wide patency of the Lvis stent across the neck of the aneurysm. ENDOVASCULAR STENT ASSISTED ANGIOPLASTY OF SYMPTOMATIC HIGH-GRADE STENOSIS OF THE LEFT MIDDLE CEREBRAL ARTERY M1 SEGMENT Over a 0.014 inch standard Synchro micro guidewire, a 2 mm x 15 mm Gateway angioplasty balloon catheter which had been prepped with 50% contrast and 50% heparinized saline infusion was advanced through the 5 [place] Catalyst guide catheter in the petrous segment of the left internal carotid artery to the supraclinoid left ICA. Using a torque device, access through the nearly completely occluded left middle cerebral artery was achieved with the micro guidewire which was advanced to the distal M2 M3 region  of the inferior division. The balloon was then advanced without difficulty and positioned such that the proximal and the distal markers were adequate distant and covered the segmental high-grade stenosis. A control angioplasty was then performed using micro inflation syringe device via micro tubing. Slow increments were performed in the atmospheric pressures of the balloon to 4.9 atmospheres achieving approximately 1.95 mm diameter of the balloon. This was maintained for approximately 90 seconds. The balloon was then gently deflated and retrieved proximally whilst the wire was maintained distally in the M2 M3 region. A control arteriogram performed through the 5 [place] Catalyst guide catheter demonstrated significantly improved caliber and flow through the angioplastied segment. The Gateway balloon was then advanced to the M2 M3 region over the micro guidewire. The micro guidewire was then retrieved. Free aspiration of blood was noted from the hub of the Gateway microcatheter. This was in turn exchanged for a 014 inch 300 cm Softip Transend exchange micro guidewire using biplane roadmap technique and constant fluoroscopic guidance. The micro  guidewire had a J shaped configuration to avoid dissections or inducing spasm. A control arteriogram performed through the Catalyst guide catheter in the left internal carotid artery following the exchange micro guidewire placement demonstrated significantly improved caliber and flow through the angioplastied segment to approximately 60-70% patency. It was elected to proceed with placement of a Wingspan stent across the angioplastied segment of the left middle cerebral artery. A 3.5 mm x 20 mm Wingspan system was then prepped and purged with heparinized saline infusion in its housing. The inner core was purged with heparinized saline infusion. Also the delivery microcatheter was purged with heparinized saline infusion and retrogradely via a Tuohy Borst and a 3 way stopcock to ensure absence of any air. The system was then advanced as a unit over the exchange micro guidewire without difficulty. The distal and the proximal markers of the stent were then advanced such that they were adequate distant at the site of the angioplastied segment. The Tuohy Borst at the hub of the microcatheter was then loosened. The inner core was then advanced with its butt at the proximal portion of the stent. Thereafter, whilst holding the inner core of the stent, the delivery microcatheter was retrieved unsheathing the distal and then the proximal portion of the stent. Once delivered, a control arteriogram performed through the 5 [place] Catalyst guide catheter demonstrated excellent apposition of the stent and coverage proximally and distally. Wide patency of the left middle cerebral artery angioplasty and stent segment was seen of almost 70-80%. The delivery micro guidewire was then retrieved and removed. Control arteriograms performed through the left internal carotid artery continued to demonstrate excellent flow through left MCA distribution without evidence of intraluminal filling defects or of occlusions involving the anterior or  the middle cerebral artery distributions. 6 mg of intra-arterial Integrilin were given following the placement of the Wingspan stent system. A control arteriogram performed through the 6 French Pinnacle sheath following removal of the Catalyst guide catheter now demonstrated the previously angioplastied segment of the left internal carotid artery proximally. The exchange micro guidewire had been maintained distally in the horizontal petrous segment. Noted was modest recoil at the site of the previous angioplasty. It was, therefore, decided to proceed with placement of a stent across the angioplastied segment of the previously severely stenotic left internal carotid artery. Over a 0.014 inch exchange micro guidewire, with the distal end in the petrous horizontal segment, measurements were performed of the proximal and the distal landing zones of  the planned stent. After evaluating the measurements obtained, it was decided to proceed with placement of a 9-7 mm x 40 mm Xact stent. This was prepped and purged retrogradely with heparinized saline infusion. Using the rapid exchange technique, the stent delivery system was advanced without difficulty and positioned such that the distal and the proximal markers were adequate distant from the site of the previous angioplasty segment. This stent was then deployed without any difficulty in the usual manner. The delivery catheter system was then retrieved and removed whilst the exchange micro guidewire was maintained distally. A control arteriogram performed through the 6 French Pinnacle sheath immediately and 10 and 20 minutes placement of the stent continued to demonstrate excellent flow through the stented segment with mild spasm at its distal end. Because of the risk of development of platelet aggregation within the stent, it was decided to give additional 6 mg of Integrilin intra-arterially. A final control arteriogram performed approximately 25 minutes following placement  of the left internal carotid artery stent continued to demonstrate excellent flow intracranially and extra cranially. 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 The left middle cerebral artery angioplasty in the stented segment now demonstrated close to 80% patency without gross evidence of intraluminal filling defects or of occlusions. Throughout the procedure, the patient's blood pressure and neurological status remained stable. The patient's ACT was maintained in the region of approximately 130 seconds throughout the procedure. A flat panel CT of the brain performed with the patient on the table demonstrated no evidence of intracranial hemorrhage, mass effect or midline shift. The patient was then returned to the OR for removal of the 6 French Pinnacle sheath by vascular surgery as per plan. It was planned to a keep the patient intubated on ventilator for airway protection given the antiplatelets and anticoagulation given for the entirety of the procedure. 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 PLAN: Patient to remain intubated for airway protection given the direct left carotid artery access and use of antiplatelets, and anticoagulation. Electronically Signed   By: Luanne Bras M.D.   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  Simultaneous discovery of a large saccular  lobulated aneurysm of the left anterior cerebral artery A2 A3 junction measuring approximately 9.7 mm x 7 mm. 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  EXAM: TRANSCATHETER THERAPY EMBOLIZATION  COMPARISON:  Diagnostic catheter arteriogram of March 21, 2019.  MEDICATIONS: Heparin 1000 units IV. Ancef 4 g IV antibiotic was administered within 1 hour of the procedure.  ANESTHESIA/SEDATION: General anesthesia.  CONTRAST:  Isovue 300 approximately 150 mL.  FLUOROSCOPY TIME:  Fluoroscopy Time: 101 minutes 0 seconds (4155 mGy).  COMPLICATIONS: None immediate.  TECHNIQUE: Informed written consent was obtained from the patient after a thorough discussion of the procedural risks, benefits and alternatives. All questions were addressed. Maximal Sterile Barrier Technique was utilized including caps, mask, sterile gowns, sterile gloves, sterile drape, hand hygiene and skin antiseptic. A timeout was performed prior to the initiation of  the procedure.  Direct access to the left common carotid artery was provided by vascular surgery in the OR due to patient's severe peripheral vascular disease, and absent flow in the right radial artery.  A 6 French Pinnacle sheath was inserted and sutured in the left common carotid artery in the mid section. The patient was brought to the angio suite with the 6 French Pinnacle sheath.  This was then connected to continuous heparinized saline infusion following flushing. An arteriogram was then obtained of the left common carotid bifurcation and also intracranially.  FINDINGS: The left common carotid bifurcation again demonstrates the left external carotid artery and its major branches to be widely patent.  The left internal carotid artery just distal to the bulb again demonstrates a significant stenosis just distal to the carotid bulb of approximately 70-75%.  More distally, the left internal carotid artery is seen to opacify to  the cranial skull base.  The petrous segment is widely patent.  There is a mild to moderate stenosis of the distal cavernous segment of the left internal carotid artery and to a lesser degree of the supraclinoid left internal carotid artery. A left posterior communicating artery is seen opacifying the left posterior cerebral artery distribution.  The left middle cerebral artery again demonstrates a severe M1 segment stenosis with opacification of the left MCA trifurcation branches into the capillary and venous phases.  The left anterior cerebral artery demonstrates mild arteriosclerotic changes in the proximal A1 and A2 segments.  More distally, mild arteriosclerotic changes are also noted in the A3 segment of the left anterior cerebral artery pericallosal branch.  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  The subsequent capillary and venous phases are unremarkable.  ENDOVASCULAR TREATMENT OF THE LEFT ANTERIOR CEREBRAL ARTERY PERICALLOSAL REGION ANEURYSM WITH STENT ASSISTED COILING  Initial treatment angioplasty of the left internal carotid approximately was performed following advancement of a 5 mm x 30 mm Viatrac 14 angioplasty microcatheter. This had been prepped and purged with heparinized saline infusion. Over a 014 inch standard Synchro micro guidewire with a J configuration, the combination of the Viatrac 14 angioplasty balloon catheter in the micro guidewire advanced to the proximal left internal carotid artery.  Using a torque device, the micro guidewire was advanced without difficulty to the distal cervical segment of the left internal carotid artery.  This was then followed by the advancement of the Viatrac 5 x 30 mm balloon in place adequate distance at the site of severe stenosis.  Slow control angioplasty was then performed using micro inflation syringe device via micro tubing. Balloon was inflated to 8.3  atmospheres where it was maintained for approximately 1 minute. The balloon was then deflated and advanced more distally. A control arteriogram performed through 5 [place] Pinnacle sheath in the left common carotid artery again demonstrated significantly improved caliber and flow through the angioplastied segment.  The combination of the balloon and the microcatheter were advanced to the horizontal petrous segment of the left internal carotid artery.  The micro guidewire was removed, and the Viatrac balloon was then exchanged for an 014 inch 300 cm Softip Transend exchange guidewire. Over the exchange micro guidewire, a 5 French 115 cm Catalyst guide catheter was advanced and positioned in the horizontal petrous segment of the left internal carotid artery.  The exchange micro guidewire was then removed. Good aspiration was obtained from the hub of the [place] guide  catheter in the left internal carotid artery. A control arteriogram performed through the 5 [place] Catalyst guide catheter in the left internal carotid artery demonstrated no changes in the intracranial circulation.  Over a 0.014 inch standard Synchro micro guidewire with a J configuration, a Headway 17 2 tip microcatheter was advanced without difficulty to the supraclinoid left ICA.  The micro guidewire was then gently manipulated with a torque device and advanced through left anterior cerebral artery A1 A2 segments followed by the microcatheter to the A3 segment of the anterior cerebral artery pericallosal branch. The wire was then advanced without difficulty distal to the neck of the aneurysm followed by the microcatheter. The guidewire was removed. Good aspiration obtained from the hub of microcatheter. A gentle control arteriogram performed through the microcatheter demonstrates safe position of tip of the microcatheter. This was then connected to continuous heparinized saline infusion. At this time after having obtained measurements of  the pericallosal artery distal and proximal to the aneurysm, a 2.5 mm x 23 mm Lvis junior stent was advanced to the distal end of the microcatheter.  Under constant fluoroscopic guidance using roadmap technique, the delivery microcatheter was retrieved unsheathing the distal and then the proximal Lvis stent. A control arteriogram performed through the 5 [place] Catalyst guide catheter now advanced to the A1 segment of the left anterior cerebral artery demonstrates safe positioning and excellent apposition of the Lvis stent. At this time, the 014 inch standard Synchro micro guidewire was then advanced through the microcatheter into the aneurysm without difficulty followed by the microcatheter. The guidewire was removed. Good aspiration was obtained with the intra aneurysmal microcatheter. This was then connected to continuous heparinized saline infusion.  The first coil utilized for aneurysm embolization was a Microplex 10 5 mm x 15 mm HyperSoft 3D coil. This was advanced using biplane roadmap technique and constant fluoroscopic guidance in a coaxial manner and with constant heparinized saline infusion to the distal end of the intra-aneurysmal microcatheter. This coil was then advanced under constant fluoroscopic guidance. Prior to its detachment, a control arteriogram performed through the 5 [place] Catalyst guide catheter in the left anterior cerebral artery demonstrates safe positioning of the coil. This was then detached without difficulty. This was subsequently followed by advancement of a 4 mm x 8 mm HyperSoft 3D coil, a 2.5 mm x 8 cm HyperSoft 3D coil, a 3 mm x 6 cm HyperSoft 3D coil, a 3 mm x 4 cm Microplex HyperSoft 3D coil and finally a 2 mm x 6 cm Microplex HyperSoft helical coil. Each of these coils was advanced into the aneurysm under fluoroscopic guidance using biplane roadmap technique. Prior to their detachment, a control arteriogram was performed through the guide catheter to ensure safe positioning  of the coil mass.  Following the final coil there was near complete obliteration of the aneurysm.  The microcatheter was gently retrieved from the coil mass without any difficulty. A control arteriogram performed demonstrated near complete occlusion of the aneurysm with wide patency of the Lvis stent across the neck of the aneurysm.  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  Using a torque  device, access through the nearly completely occluded left middle cerebral artery was achieved with the micro guidewire which was advanced to the distal M2 M3 region of the inferior division. The balloon was then advanced without difficulty and positioned such that the proximal and the distal markers were adequate distant and covered the segmental high-grade stenosis.  A control angioplasty was then performed using micro inflation syringe device via micro tubing. Slow increments were performed in the atmospheric pressures of the balloon to 4.9 atmospheres achieving approximately 1.95 mm diameter of the balloon. This was maintained for approximately 90 seconds. The balloon was then gently deflated and retrieved proximally whilst the wire was maintained distally in the M2 M3 region.  A control arteriogram performed through the 5 [place] Catalyst guide catheter demonstrated significantly improved caliber and flow through the angioplastied segment. The Gateway balloon was then advanced to the M2 M3 region over the micro guidewire. The micro guidewire was then retrieved. Free aspiration of blood was noted from the hub of the  Gateway microcatheter.  This was in turn exchanged for a 014 inch 300 cm Softip Transend exchange micro guidewire using biplane roadmap technique and constant fluoroscopic guidance. The micro guidewire had a J shaped configuration to avoid dissections or inducing spasm.  A control arteriogram performed through the Catalyst guide catheter in the left internal carotid artery following the exchange micro guidewire placement demonstrated significantly improved caliber and flow through the angioplastied segment to approximately 60-70% patency.  It was elected to proceed with placement of a Wingspan stent across the angioplastied segment of the left middle cerebral artery.  A 3.5 mm x 20 mm Wingspan system was then prepped and purged with heparinized saline infusion in its housing.  The inner core was purged with heparinized saline infusion. Also the delivery microcatheter was purged with heparinized saline infusion and retrogradely via a Tuohy Borst and a 3 way stopcock to ensure absence of any air.  The system was then advanced as a unit over the exchange micro guidewire without difficulty.  The distal and the proximal markers of the stent were then advanced such that they were adequate distant at the site of the angioplastied segment.  The Tuohy Borst at the hub of the microcatheter was then loosened. The inner core was then advanced with its butt at the proximal portion of the stent. Thereafter, whilst holding the inner core of the stent, the delivery microcatheter was retrieved unsheathing the distal and then the proximal portion of the stent. Once delivered, a control arteriogram performed through the 5 [place] Catalyst guide catheter demonstrated excellent apposition of the stent and coverage proximally and distally. Wide patency of the left middle cerebral artery angioplasty and stent segment was seen of almost 70-80%.  The delivery micro guidewire was then retrieved and removed.  Control arteriograms  performed through the left internal carotid artery continued to demonstrate excellent flow through left MCA distribution without evidence of intraluminal filling defects or of occlusions involving the anterior or the middle cerebral artery distributions.  6 mg of intra-arterial Integrilin were given following the placement of the Wingspan stent system.  A control arteriogram performed through the 6 French Pinnacle sheath following removal of the Catalyst guide catheter now demonstrated the previously angioplastied segment of the left internal carotid artery proximally. The exchange micro guidewire had been maintained distally in the horizontal petrous segment.  Noted was modest recoil at the site of the previous angioplasty. It was, therefore, decided to proceed with placement of a stent  across the angioplastied segment of the previously severely stenotic left internal carotid artery.  Over a 0.014 inch exchange micro guidewire, with the distal end in the petrous horizontal segment, measurements were performed of the proximal and the distal landing zones of the planned stent. After evaluating the measurements obtained, it was decided to proceed with placement of a 9-7 mm x 40 mm Xact stent. This was prepped and purged retrogradely with heparinized saline infusion. Using the rapid exchange technique, the stent delivery system was advanced without difficulty and positioned such that the distal and the proximal markers were adequate distant from the site of the previous angioplasty segment. This stent was then deployed without any difficulty in the usual manner. The delivery catheter system was then retrieved and removed whilst the exchange micro guidewire was maintained distally.  A control arteriogram performed through the 6 French Pinnacle sheath immediately and 10 and 20 minutes placement of the stent continued to demonstrate excellent flow through the stented segment with mild spasm at its distal end. Because  of the risk of development of platelet aggregation within the stent, it was decided to give additional 6 mg of Integrilin intra-arterially.  A final control arteriogram performed approximately 25 minutes following placement of the left internal carotid artery stent continued to demonstrate excellent flow intracranially and extra cranially. 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 The left middle cerebral artery angioplasty in the stented segment now demonstrated close to 80% patency without gross evidence of intraluminal filling defects or of occlusions.  Throughout the procedure, the patient's blood pressure and neurological status remained stable. The  patient's ACT was maintained in the region of approximately 130 seconds throughout the procedure.  A flat panel CT of the brain performed with the patient on the table demonstrated no evidence of intracranial hemorrhage, mass effect or midline shift.  The patient was then returned to the OR for removal of the 6 French Pinnacle sheath by vascular surgery as per plan.  It was planned to a keep the patient intubated on ventilator for airway protection given the antiplatelets and anticoagulation given for the entirety of the procedure.  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  PLAN: Patient to remain intubated for airway protection given the direct left carotid artery access and use of antiplatelets, and anticoagulation.   Electronically Signed   By: Luanne Bras M.D.   On: 03/28/2019 09:53   IR  Angiogram Follow Up Study  Result Date: 03/26/2019 CLINICAL DATA:  Symptomatic high-grade stenosis of the left middle cerebral artery proximal M1 segment with history of recent ischemic stroke.  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  EXAM: TRANSCATHETER THERAPY EMBOLIZATION  COMPARISON:  Diagnostic catheter arteriogram of March 21, 2019.  MEDICATIONS: Heparin 1000 units IV. Ancef 4 g IV antibiotic was administered within 1 hour of the procedure.  ANESTHESIA/SEDATION: General anesthesia.  CONTRAST:  Isovue 300 approximately 150 mL.  FLUOROSCOPY TIME:  Fluoroscopy Time: 101 minutes 0 seconds (4155 mGy).  COMPLICATIONS: None  immediate.  TECHNIQUE: Informed written consent was obtained from the patient after a thorough discussion of the procedural risks, benefits and alternatives. All questions were addressed. Maximal Sterile Barrier Technique was utilized including caps, mask, sterile gowns, sterile gloves, sterile drape, hand hygiene and skin antiseptic. A timeout was performed prior to the initiation of the procedure.  Direct access to the left common carotid artery was provided by vascular surgery in the OR due to patient's severe peripheral vascular disease, and absent flow in the right radial artery.  A 6 French Pinnacle sheath was inserted and sutured in the left common carotid artery in the mid section. The patient was brought to the angio suite with the 6 French Pinnacle sheath.  This was then connected to continuous heparinized saline infusion following flushing. An arteriogram was then obtained of the left common carotid bifurcation and also intracranially.  FINDINGS: The left common carotid bifurcation again demonstrates the left external carotid artery and its major branches to be  widely patent.  The left internal carotid artery just distal to the bulb again demonstrates a significant stenosis just distal to the carotid bulb of approximately 70-75%.  More distally, the left internal carotid artery is seen to opacify to the cranial skull base.  The petrous segment is widely patent.  There is a mild to moderate stenosis of the distal cavernous segment of the left internal carotid artery and to a lesser degree of the supraclinoid left internal carotid artery. A left posterior communicating artery is seen opacifying the left posterior cerebral artery distribution.  The left middle cerebral artery again demonstrates a severe M1 segment stenosis with opacification of the left MCA trifurcation branches into the capillary and venous phases.  The left anterior cerebral artery demonstrates mild arteriosclerotic changes in the proximal A1 and A2 segments.  More distally, mild arteriosclerotic changes are also noted in the A3 segment of the left anterior cerebral artery pericallosal branch.  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  The subsequent capillary and venous phases are unremarkable.  ENDOVASCULAR TREATMENT OF THE LEFT ANTERIOR CEREBRAL ARTERY PERICALLOSAL REGION ANEURYSM WITH STENT ASSISTED COILING  Initial treatment angioplasty of the left internal carotid approximately was performed following advancement of a 5 mm x 30 mm Viatrac 14 angioplasty microcatheter. This had been prepped and purged with heparinized saline infusion. Over a 014 inch standard Synchro micro guidewire with a J configuration, the combination of the Viatrac 14 angioplasty balloon catheter in the micro guidewire advanced to the proximal left internal carotid artery.  Using a torque device, the micro guidewire was advanced without difficulty to the distal cervical segment of the left internal carotid artery.  This was then  followed by the advancement of the Viatrac 5 x 30 mm balloon in place adequate distance at the site of severe stenosis.  Slow control angioplasty was then performed using micro inflation syringe device via micro tubing. Balloon was inflated to 8.3 atmospheres where it was maintained for approximately 1 minute. The balloon was then deflated and advanced more distally. A control arteriogram performed through 5 French Pinnacle sheath in the left common carotid artery again demonstrated significantly improved caliber and flow through the angioplastied segment.  The combination of the balloon and the microcatheter were advanced to the horizontal petrous segment of the left internal carotid artery.  The micro guidewire was removed, and the Viatrac balloon was then exchanged for an 014 inch  300 cm Softip Transend exchange guidewire. Over the exchange micro guidewire, a 5 French 115 cm Catalyst guide catheter was advanced and positioned in the horizontal petrous segment of the left internal carotid artery.  The exchange micro guidewire was then removed. Good aspiration was obtained from the hub of the 5 [place] Catalyst guide catheter in the left internal carotid artery. A control arteriogram performed through the 5 [place] Catalyst guide catheter in the left internal carotid artery demonstrated no changes in the intracranial circulation.  Over a 0.014 inch standard Synchro micro guidewire with a J configuration, a Headway 17 2 tip microcatheter was advanced without difficulty to the supraclinoid left ICA.  The micro guidewire was then gently manipulated with a torque device and advanced through left anterior cerebral artery A1 A2 segments followed by the microcatheter to the A3 segment of the anterior cerebral artery pericallosal branch. The wire was then advanced without difficulty distal to the neck of the aneurysm followed by the microcatheter. The guidewire was removed. Good aspiration obtained from the hub of  microcatheter. A gentle control arteriogram performed through the microcatheter demonstrates safe position of tip of the microcatheter. This was then connected to continuous heparinized saline infusion. At this time after having obtained measurements of the pericallosal artery distal and proximal to the aneurysm, a 2.5 mm x 23 mm Lvis junior stent was advanced to the distal end of the microcatheter.  Under constant fluoroscopic guidance using roadmap technique, the delivery microcatheter was retrieved unsheathing the distal and then the proximal Lvis stent. A control arteriogram performed through the 5 [place] Catalyst guide catheter now advanced to the A1 segment of the left anterior cerebral artery demonstrates safe positioning and excellent apposition of the Lvis stent. At this time, the 014 inch standard Synchro micro guidewire was then advanced through the microcatheter into the aneurysm without difficulty followed by the microcatheter. The guidewire was removed. Good aspiration was obtained with the intra aneurysmal microcatheter. This was then connected to continuous heparinized saline infusion.  The first coil utilized for aneurysm embolization was a Microplex 10 5 mm x 15 mm HyperSoft 3D coil. This was advanced using biplane roadmap technique and constant fluoroscopic guidance in a coaxial manner and with constant heparinized saline infusion to the distal end of the intra-aneurysmal microcatheter. This coil was then advanced under constant fluoroscopic guidance. Prior to its detachment, a control arteriogram performed through the 5 [place] Catalyst guide catheter in the left anterior cerebral artery demonstrates safe positioning of the coil. This was then detached without difficulty. This was subsequently followed by advancement of a 4 mm x 8 mm HyperSoft 3D coil, a 2.5 mm x 8 cm HyperSoft 3D coil, a 3 mm x 6 cm HyperSoft 3D coil, a 3 mm x 4 cm Microplex HyperSoft 3D coil and finally a 2 mm x 6 cm Microplex  HyperSoft helical coil. Each of these coils was advanced into the aneurysm under fluoroscopic guidance using biplane roadmap technique. Prior to their detachment, a control arteriogram was performed through the guide catheter to ensure safe positioning of the coil mass.  Following the final coil there was near complete obliteration of the aneurysm.  The microcatheter was gently retrieved from the coil mass without any difficulty. A control arteriogram performed demonstrated near complete occlusion of the aneurysm with wide patency of the Lvis stent across the neck of the aneurysm.  ENDOVASCULAR STENT ASSISTED ANGIOPLASTY OF SYMPTOMATIC HIGH-GRADE STENOSIS OF THE LEFT MIDDLE CEREBRAL ARTERY M1 SEGMENT  Over a 0.014  inch standard Synchro micro guidewire, a 2 mm x 15 mm Gateway angioplasty balloon catheter which had been prepped with 50% contrast and 50% heparinized saline infusion was advanced through the 5 [place] Catalyst guide catheter in the petrous segment of the left internal carotid artery to the supraclinoid left ICA.  Using a torque device, access through the nearly completely occluded left middle cerebral artery was achieved with the micro guidewire which was advanced to the distal M2 M3 region of the inferior division. The balloon was then advanced without difficulty and positioned such that the proximal and the distal markers were adequate distant and covered the segmental high-grade stenosis.  A control angioplasty was then performed using micro inflation syringe device via micro tubing. Slow increments were performed in the atmospheric pressures of the balloon to 4.9 atmospheres achieving approximately 1.95 mm diameter of the balloon. This was maintained for approximately 90 seconds. The balloon was then gently deflated and retrieved proximally whilst the wire was maintained distally in the M2 M3 region.  A control arteriogram performed through the 5 [place] Catalyst guide catheter demonstrated  significantly improved caliber and flow through the angioplastied segment. The Gateway balloon was then advanced to the M2 M3 region over the micro guidewire. The micro guidewire was then retrieved. Free aspiration of blood was noted from the hub of the Gateway microcatheter.  This was in turn exchanged for a 014 inch 300 cm Softip Transend exchange micro guidewire using biplane roadmap technique and constant fluoroscopic guidance. The micro guidewire had a J shaped configuration to avoid dissections or inducing spasm.  A control arteriogram performed through the Catalyst guide catheter in the left internal carotid artery following the exchange micro guidewire placement demonstrated significantly improved caliber and flow through the angioplastied segment to approximately 60-70% patency.  It was elected to proceed with placement of a Wingspan stent across the angioplastied segment of the left middle cerebral artery.  A 3.5 mm x 20 mm Wingspan system was then prepped and purged with heparinized saline infusion in its housing.  The inner core was purged with heparinized saline infusion. Also the delivery microcatheter was purged with heparinized saline infusion and retrogradely via a Tuohy Borst and a 3 way stopcock to ensure absence of any air.  The system was then advanced as a unit over the exchange micro guidewire without difficulty.  The distal and the proximal markers of the stent were then advanced such that they were adequate distant at the site of the angioplastied segment.  The Tuohy Borst at the hub of the microcatheter was then loosened. The inner core was then advanced with its butt at the proximal portion of the stent. Thereafter, whilst holding the inner core of the stent, the delivery microcatheter was retrieved unsheathing the distal and then the proximal portion of the stent. Once delivered, a control arteriogram performed through the 5 [place] Catalyst guide catheter demonstrated excellent  apposition of the stent and coverage proximally and distally. Wide patency of the left middle cerebral artery angioplasty and stent segment was seen of almost 70-80%.  The delivery micro guidewire was then retrieved and removed.  Control arteriograms performed through the left internal carotid artery continued to demonstrate excellent flow through left MCA distribution without evidence of intraluminal filling defects or of occlusions involving the anterior or the middle cerebral artery distributions.  6 mg of intra-arterial Integrilin were given following the placement of the Wingspan stent system.  A control arteriogram performed through the 6 French Pinnacle sheath following  removal of the Catalyst guide catheter now demonstrated the previously angioplastied segment of the left internal carotid artery proximally. The exchange micro guidewire had been maintained distally in the horizontal petrous segment.  Noted was modest recoil at the site of the previous angioplasty. It was, therefore, decided to proceed with placement of a stent across the angioplastied segment of the previously severely stenotic left internal carotid artery.  Over a 0.014 inch exchange micro guidewire, with the distal end in the petrous horizontal segment, measurements were performed of the proximal and the distal landing zones of the planned stent. After evaluating the measurements obtained, it was decided to proceed with placement of a 9-7 mm x 40 mm Xact stent. This was prepped and purged retrogradely with heparinized saline infusion. Using the rapid exchange technique, the stent delivery system was advanced without difficulty and positioned such that the distal and the proximal markers were adequate distant from the site of the previous angioplasty segment. This stent was then deployed without any difficulty in the usual manner. The delivery catheter system was then retrieved and removed whilst the exchange micro guidewire was  maintained distally.  A control arteriogram performed through the 6 [place] Pinnacle sheath immediately and 10 and 20 minutes placement of the stent continued to demonstrate excellent flow through the stented segment with mild spasm at its distal end. Because of the risk of development of platelet aggregation within the stent, it was decided to give additional 6 mg of Integrilin intra-arterially.  A final control arteriogram performed approximately 25 minutes following placement of the left internal carotid artery stent continued to demonstrate excellent flow intracranially and extra cranially. 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 The left middle cerebral artery angioplasty in the stented segment now demonstrated close to 80% patency without gross evidence of intraluminal filling defects or of occlusions.  Throughout the procedure, the patient's blood pressure and neurological status remained stable. The  patient's ACT was maintained in the region of approximately 130 seconds throughout the procedure.  A flat panel CT of the brain performed with the patient on the table demonstrated no evidence of intracranial hemorrhage, mass effect or midline shift.  The patient was then returned to the OR for removal of the 6 French Pinnacle sheath by vascular surgery as per plan.  It was planned to a keep the patient intubated on ventilator for airway protection given the antiplatelets and anticoagulation given for the entirety of the procedure.  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  Status post endovascular stent assisted angioplasty of high-grade left internal carotid  artery stenosis proximally.  PLAN: Patient to remain intubated for airway protection given the direct left carotid artery access and use of antiplatelets, and anticoagulation.   Electronically Signed   By: Luanne Bras M.D.   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  EXAM: TRANSCATHETER THERAPY EMBOLIZATION  COMPARISON:  Diagnostic catheter arteriogram of March 21, 2019.  MEDICATIONS: Heparin 1000 units IV. Ancef 4 g IV antibiotic was administered within 1 hour of the procedure.  ANESTHESIA/SEDATION: General anesthesia.  CONTRAST:  Isovue 300 approximately 150 mL.  FLUOROSCOPY TIME:  Fluoroscopy Time: 101 minutes 0 seconds (4155 mGy).  COMPLICATIONS: None immediate.  TECHNIQUE: Informed written consent was obtained from the patient after a thorough discussion of the procedural risks, benefits and alternatives. All questions were addressed. Maximal Sterile Barrier Technique was utilized including caps, mask, sterile gowns, sterile gloves, sterile drape, hand hygiene and skin antiseptic. A timeout was performed prior to the initiation of the procedure.  Direct access to the left common carotid artery was provided by vascular surgery in the OR due to patient's severe peripheral vascular disease, and absent flow in the right radial artery.  A 6 French Pinnacle sheath was inserted and sutured in the left common carotid artery in the mid section. The patient was brought to the angio suite with the 6 [place] Pinnacle sheath.  This was then connected to continuous heparinized  saline infusion following flushing. An arteriogram was then obtained of the left common carotid bifurcation and also intracranially.  FINDINGS: The left common carotid bifurcation again demonstrates the left external carotid artery and its major branches to be widely patent.  The left internal carotid artery just distal to the bulb again demonstrates a significant stenosis just distal to the carotid bulb of approximately 70-75%.  More distally, the left internal carotid artery is seen to opacify to the cranial skull base.  The petrous segment is widely patent.  There is a mild to moderate stenosis of the distal cavernous segment of the left internal carotid artery and to a lesser degree of the supraclinoid left internal carotid artery. A left posterior communicating artery is seen opacifying the left posterior cerebral artery distribution.  The left middle cerebral artery again demonstrates a severe M1 segment stenosis with opacification of the left MCA trifurcation branches into the capillary and venous phases.  The left anterior cerebral artery demonstrates mild arteriosclerotic changes in the proximal A1 and A2 segments.  More distally, mild arteriosclerotic changes are also noted in the A3 segment of the left anterior cerebral artery pericallosal branch.  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  The subsequent capillary and venous phases are unremarkable.  ENDOVASCULAR TREATMENT OF THE LEFT ANTERIOR CEREBRAL ARTERY PERICALLOSAL REGION ANEURYSM WITH STENT ASSISTED COILING  Initial treatment angioplasty of the left internal carotid approximately was performed following advancement of a 5 mm x 30 mm Viatrac 14 angioplasty microcatheter. This had been prepped and purged with heparinized saline infusion. Over a 014 inch standard Synchro micro guidewire with a J configuration, the combination of the Viatrac 14 angioplasty  balloon catheter in the micro guidewire advanced to the proximal left internal carotid artery.  Using a torque device, the micro guidewire was advanced without difficulty to the distal cervical segment of the left internal carotid artery.  This was then followed by the advancement of the Viatrac 5 x 30 mm balloon in place adequate distance at the site of severe stenosis.  Slow control angioplasty was then performed using micro inflation syringe device via micro tubing. Balloon was inflated to 8.3 atmospheres where it was maintained for approximately 1 minute. The balloon was then deflated and advanced more distally. A control arteriogram performed through 5 French Pinnacle sheath in  the left common carotid artery again demonstrated significantly improved caliber and flow through the angioplastied segment.  The combination of the balloon and the microcatheter were advanced to the horizontal petrous segment of the left internal carotid artery.  The micro guidewire was removed, and the Viatrac balloon was then exchanged for an 014 inch 300 cm Softip Transend exchange guidewire. Over the exchange micro guidewire, a 5 French 115 cm Catalyst guide catheter was advanced and positioned in the horizontal petrous segment of the left internal carotid artery.  The exchange micro guidewire was then removed. Good aspiration was obtained from the hub of the 5 [place] Catalyst guide catheter in the left internal carotid artery. A control arteriogram performed through the 5 [place] Catalyst guide catheter in the left internal carotid artery demonstrated no changes in the intracranial circulation.  Over a 0.014 inch standard Synchro micro guidewire with a J configuration, a Headway 17 2 tip microcatheter was advanced without difficulty to the supraclinoid left ICA.  The micro guidewire was then gently manipulated with a torque device and advanced through left anterior cerebral artery A1 A2 segments followed by the microcatheter  to the A3 segment of the anterior cerebral artery pericallosal branch. The wire was then advanced without difficulty distal to the neck of the aneurysm followed by the microcatheter. The guidewire was removed. Good aspiration obtained from the hub of microcatheter. A gentle control arteriogram performed through the microcatheter demonstrates safe position of tip of the microcatheter. This was then connected to continuous heparinized saline infusion. At this time after having obtained measurements of the pericallosal artery distal and proximal to the aneurysm, a 2.5 mm x 23 mm Lvis junior stent was advanced to the distal end of the microcatheter.  Under constant fluoroscopic guidance using roadmap technique, the delivery microcatheter was retrieved unsheathing the distal and then the proximal Lvis stent. A control arteriogram performed through the 5 [place] Catalyst guide catheter now advanced to the A1 segment of the left anterior cerebral artery demonstrates safe positioning and excellent apposition of the Lvis stent. At this time, the 014 inch standard Synchro micro guidewire was then advanced through the microcatheter into the aneurysm without difficulty followed by the microcatheter. The guidewire was removed. Good aspiration was obtained with the intra aneurysmal microcatheter. This was then connected to continuous heparinized saline infusion.  The first coil utilized for aneurysm embolization was a Microplex 10 5 mm x 15 mm HyperSoft 3D coil. This was advanced using biplane roadmap technique and constant fluoroscopic guidance in a coaxial manner and with constant heparinized saline infusion to the distal end of the intra-aneurysmal microcatheter. This coil was then advanced under constant fluoroscopic guidance. Prior to its detachment, a control arteriogram performed through the 5 [place] Catalyst guide catheter in the left anterior cerebral artery demonstrates safe positioning of the coil. This was then  detached without difficulty. This was subsequently followed by advancement of a 4 mm x 8 mm HyperSoft 3D coil, a 2.5 mm x 8 cm HyperSoft 3D coil, a 3 mm x 6 cm HyperSoft 3D coil, a 3 mm x 4 cm Microplex HyperSoft 3D coil and finally a 2 mm x 6 cm Microplex HyperSoft helical coil. Each of these coils was advanced into the aneurysm under fluoroscopic guidance using biplane roadmap technique. Prior to their detachment, a control arteriogram was performed through the guide catheter to ensure safe positioning of the coil mass.  Following the final coil there was near complete obliteration of the aneurysm.  The microcatheter  was gently retrieved from the coil mass without any difficulty. A control arteriogram performed demonstrated near complete occlusion of the aneurysm with wide patency of the Lvis stent across the neck of the aneurysm.  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  Using a torque device, access through the nearly completely occluded left middle cerebral artery was achieved with the micro guidewire which was advanced to the distal M2 M3 region of the inferior division. The balloon was then advanced without difficulty and positioned such that the proximal and the distal markers were adequate distant and covered the segmental high-grade stenosis.  A control angioplasty was then performed using micro inflation syringe device via micro tubing. Slow increments were performed in the atmospheric pressures of the balloon to 4.9 atmospheres achieving approximately 1.95 mm diameter of the balloon. This was  maintained for approximately 90 seconds. The balloon was then gently deflated and retrieved proximally whilst the wire was maintained distally in the M2 M3 region.  A control arteriogram performed through the 5 [place] Catalyst guide catheter demonstrated significantly improved caliber and flow through the angioplastied segment. The Gateway balloon was then advanced to the M2 M3 region over the micro guidewire. The micro guidewire was then retrieved. Free aspiration of blood was noted from the hub of the Gateway microcatheter.  This was in turn exchanged for a 014 inch 300 cm Softip Transend exchange micro guidewire using biplane roadmap technique and constant fluoroscopic guidance. The micro guidewire had a J shaped configuration to avoid dissections or inducing spasm.  A control arteriogram performed through the Catalyst guide catheter in the left internal carotid artery following the exchange micro guidewire placement demonstrated significantly improved caliber and flow through the angioplastied segment to approximately 60-70% patency.  It was elected to proceed with placement of a Wingspan stent across the angioplastied segment of the left middle cerebral artery.  A 3.5 mm x 20 mm Wingspan system was then prepped and purged with heparinized saline infusion in its housing.  The inner core was purged with heparinized saline infusion. Also the delivery microcatheter was purged with heparinized saline infusion and retrogradely via a Tuohy Borst and a 3 way stopcock to ensure absence of any air.  The system was then advanced as a unit over the exchange micro guidewire without difficulty.  The distal and the proximal markers of the stent were then advanced such that they were adequate distant at the site of the angioplastied segment.  The Tuohy Borst at the hub of the microcatheter was then loosened. The inner core was then advanced with its butt at the proximal portion of the stent. Thereafter, whilst holding  the inner core of the stent, the delivery microcatheter was retrieved unsheathing the distal and then the proximal portion of the stent. Once delivered, a control arteriogram performed through the 5 [place] Catalyst guide catheter demonstrated excellent apposition of the stent and coverage proximally and distally. Wide patency of the left middle cerebral artery angioplasty and stent segment was seen of almost 70-80%.  The delivery micro guidewire was then retrieved and removed.  Control arteriograms performed through the left internal carotid artery  continued to demonstrate excellent flow through left MCA distribution without evidence of intraluminal filling defects or of occlusions involving the anterior or the middle cerebral artery distributions.  6 mg of intra-arterial Integrilin were given following the placement of the Wingspan stent system.  A control arteriogram performed through the 6 French Pinnacle sheath following removal of the Catalyst guide catheter now demonstrated the previously angioplastied segment of the left internal carotid artery proximally. The exchange micro guidewire had been maintained distally in the horizontal petrous segment.  Noted was modest recoil at the site of the previous angioplasty. It was, therefore, decided to proceed with placement of a stent across the angioplastied segment of the previously severely stenotic left internal carotid artery.  Over a 0.014 inch exchange micro guidewire, with the distal end in the petrous horizontal segment, measurements were performed of the proximal and the distal landing zones of the planned stent. After evaluating the measurements obtained, it was decided to proceed with placement of a 9-7 mm x 40 mm Xact stent. This was prepped and purged retrogradely with heparinized saline infusion. Using the rapid exchange technique, the stent delivery system was advanced without difficulty and positioned such that the distal and the proximal markers  were adequate distant from the site of the previous angioplasty segment. This stent was then deployed without any difficulty in the usual manner. The delivery catheter system was then retrieved and removed whilst the exchange micro guidewire was maintained distally.  A control arteriogram performed through the 6 French Pinnacle sheath immediately and 10 and 20 minutes placement of the stent continued to demonstrate excellent flow through the stented segment with mild spasm at its distal end. Because of the risk of development of platelet aggregation within the stent, it was decided to give additional 6 mg of Integrilin intra-arterially.  A final control arteriogram performed approximately 25 minutes following placement of the left internal carotid artery stent continued to demonstrate excellent flow intracranially and extra cranially. 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 The left middle cerebral artery angioplasty in the stented segment now demonstrated close to 80% patency without gross evidence of intraluminal filling defects or of occlusions.  Throughout the procedure, the patient's blood pressure and neurological status remained stable. The  patient's ACT was maintained in the region of approximately 130 seconds throughout the procedure.  A flat panel CT of the brain performed with the patient on the table demonstrated no evidence of intracranial hemorrhage, mass effect or midline shift.  The patient was then returned to the OR for removal of the 6 French Pinnacle sheath by vascular surgery as per plan.  It was planned to a keep the patient intubated on ventilator for airway protection given the antiplatelets and anticoagulation given for the entirety of the procedure.  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  PLAN: Patient to remain intubated for airway protection given the direct left carotid artery access and use of antiplatelets, and anticoagulation.   Electronically Signed   By: Luanne Bras M.D.   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  EXAM: TRANSCATHETER THERAPY EMBOLIZATION  COMPARISON:  Diagnostic catheter arteriogram of March 21, 2019.  MEDICATIONS: Heparin 1000 units IV. Ancef 4 g IV antibiotic was administered within 1 hour of the procedure.  ANESTHESIA/SEDATION: General anesthesia.  CONTRAST:  Isovue 300 approximately 150 mL.  FLUOROSCOPY TIME:  Fluoroscopy Time: 101 minutes 0 seconds (4155 mGy).  COMPLICATIONS: None immediate.  TECHNIQUE: Informed written consent was obtained from the patient after a thorough discussion of the procedural risks, benefits and alternatives. All questions were addressed. Maximal Sterile Barrier Technique was utilized including caps, mask, sterile gowns, sterile gloves, sterile drape, hand hygiene and skin antiseptic. A timeout was performed prior to the initiation of the procedure.  Direct access to the left common carotid artery was provided by vascular surgery in the OR due to patient's severe peripheral vascular disease, and absent flow in the right radial  artery.  A 6 French Pinnacle sheath was inserted and sutured in the left common carotid artery in the mid section. The patient was brought to the angio suite with the 6 French Pinnacle sheath.  This was then connected to continuous heparinized saline infusion following flushing. An arteriogram was then obtained of the left common carotid bifurcation and also intracranially.  FINDINGS: The left common carotid bifurcation again demonstrates the left external carotid artery and its major branches to be widely patent.  The left internal carotid artery just distal to the bulb again demonstrates a significant stenosis just distal to the carotid bulb of approximately 70-75%.  More distally, the left internal carotid artery is seen to opacify to the cranial skull base.  The petrous segment is widely patent.  There is a mild to moderate stenosis of the distal cavernous segment of the left internal carotid artery and to a lesser degree of the supraclinoid left internal carotid artery. A left posterior communicating artery is seen opacifying the left posterior cerebral artery distribution.  The left middle cerebral artery again demonstrates a severe M1 segment stenosis with opacification of the left MCA trifurcation branches into the capillary and venous phases.  The left anterior cerebral artery demonstrates mild arteriosclerotic changes in the proximal A1 and A2 segments.  More distally, mild arteriosclerotic changes are also noted in the A3 segment of the left anterior cerebral artery pericallosal branch.  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  The subsequent capillary and venous phases are unremarkable.  ENDOVASCULAR TREATMENT OF THE LEFT ANTERIOR CEREBRAL ARTERY PERICALLOSAL REGION ANEURYSM WITH STENT ASSISTED COILING  Initial treatment angioplasty of the left internal carotid approximately was performed following  advancement of a 5 mm x 30 mm Viatrac 14 angioplasty microcatheter. This had been prepped and purged with heparinized saline infusion. Over a 014 inch standard Synchro micro guidewire with a J configuration, the combination of the Viatrac 14 angioplasty balloon catheter in the micro guidewire advanced to the proximal left internal carotid artery.  Using a torque device, the micro guidewire was advanced without difficulty to the distal cervical segment of the left internal carotid artery.  This was then followed by the advancement of the Viatrac 5 x 30 mm balloon in place adequate  distance at the site of severe stenosis.  Slow control angioplasty was then performed using micro inflation syringe device via micro tubing. Balloon was inflated to 8.3 atmospheres where it was maintained for approximately 1 minute. The balloon was then deflated and advanced more distally. A control arteriogram performed through 5 [place] Pinnacle sheath in the left common carotid artery again demonstrated significantly improved caliber and flow through the angioplastied segment.  The combination of the balloon and the microcatheter were advanced to the horizontal petrous segment of the left internal carotid artery.  The micro guidewire was removed, and the Viatrac balloon was then exchanged for an 014 inch 300 cm Softip Transend exchange guidewire. Over the exchange micro guidewire, a 5 French 115 cm Catalyst guide catheter was advanced and positioned in the horizontal petrous segment of the left internal carotid artery.  The exchange micro guidewire was then removed. Good aspiration was obtained from the hub of the 5 [place] Catalyst guide catheter in the left internal carotid artery. A control arteriogram performed through the 5 [place] Catalyst guide catheter in the left internal carotid artery demonstrated no changes in the intracranial circulation.  Over a 0.014 inch standard Synchro micro guidewire with a J configuration, a  Headway 17 2 tip microcatheter was advanced without difficulty to the supraclinoid left ICA.  The micro guidewire was then gently manipulated with a torque device and advanced through left anterior cerebral artery A1 A2 segments followed by the microcatheter to the A3 segment of the anterior cerebral artery pericallosal branch. The wire was then advanced without difficulty distal to the neck of the aneurysm followed by the microcatheter. The guidewire was removed. Good aspiration obtained from the hub of microcatheter. A gentle control arteriogram performed through the microcatheter demonstrates safe position of tip of the microcatheter. This was then connected to continuous heparinized saline infusion. At this time after having obtained measurements of the pericallosal artery distal and proximal to the aneurysm, a 2.5 mm x 23 mm Lvis junior stent was advanced to the distal end of the microcatheter.  Under constant fluoroscopic guidance using roadmap technique, the delivery microcatheter was retrieved unsheathing the distal and then the proximal Lvis stent. A control arteriogram performed through the 5 [place] Catalyst guide catheter now advanced to the A1 segment of the left anterior cerebral artery demonstrates safe positioning and excellent apposition of the Lvis stent. At this time, the 014 inch standard Synchro micro guidewire was then advanced through the microcatheter into the aneurysm without difficulty followed by the microcatheter. The guidewire was removed. Good aspiration was obtained with the intra aneurysmal microcatheter. This was then connected to continuous heparinized saline infusion.  The first coil utilized for aneurysm embolization was a Microplex 10 5 mm x 15 mm HyperSoft 3D coil. This was advanced using biplane roadmap technique and constant fluoroscopic guidance in a coaxial manner and with constant heparinized saline infusion to the distal end of the intra-aneurysmal microcatheter. This  coil was then advanced under constant fluoroscopic guidance. Prior to its detachment, a control arteriogram performed through the 5 [place] Catalyst guide catheter in the left anterior cerebral artery demonstrates safe positioning of the coil. This was then detached without difficulty. This was subsequently followed by advancement of a 4 mm x 8 mm HyperSoft 3D coil, a 2.5 mm x 8 cm HyperSoft 3D coil, a 3 mm x 6 cm HyperSoft 3D coil, a 3 mm x 4 cm Microplex HyperSoft 3D coil and finally a 2 mm x 6 cm Microplex HyperSoft  helical coil. Each of these coils was advanced into the aneurysm under fluoroscopic guidance using biplane roadmap technique. Prior to their detachment, a control arteriogram was performed through the guide catheter to ensure safe positioning of the coil mass.  Following the final coil there was near complete obliteration of the aneurysm.  The microcatheter was gently retrieved from the coil mass without any difficulty. A control arteriogram performed demonstrated near complete occlusion of the aneurysm with wide patency of the Lvis stent across the neck of the aneurysm.  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  Using a torque device, access through the nearly completely occluded left middle cerebral artery was achieved with the micro guidewire which was advanced to the distal M2 M3 region of the inferior division. The balloon was then advanced without difficulty and positioned such that the proximal and the distal markers were adequate distant and covered the segmental high-grade stenosis.  A  control angioplasty was then performed using micro inflation syringe device via micro tubing. Slow increments were performed in the atmospheric pressures of the balloon to 4.9 atmospheres achieving approximately 1.95 mm diameter of the balloon. This was maintained for approximately 90 seconds. The balloon was then gently deflated and retrieved proximally whilst the wire was maintained distally in the M2 M3 region.  A control arteriogram performed through the 5 [place] Catalyst guide catheter demonstrated significantly improved caliber and flow through the angioplastied segment. The Gateway balloon was then advanced to the M2 M3 region over the micro guidewire. The micro guidewire was then retrieved. Free aspiration of blood was noted from the hub of the Gateway microcatheter.  This was in turn exchanged for a 014 inch 300 cm Softip Transend exchange micro guidewire using biplane roadmap technique and constant fluoroscopic guidance. The micro guidewire had a J shaped configuration to avoid dissections or inducing spasm.  A control arteriogram performed through the Catalyst guide catheter in the left internal carotid artery following the exchange micro guidewire placement demonstrated significantly improved caliber and flow through the angioplastied segment to approximately 60-70% patency.  It was elected to proceed with placement of a Wingspan stent across the angioplastied segment of the left middle cerebral artery.  A 3.5 mm x 20 mm Wingspan system was then prepped and purged with heparinized saline infusion in its housing.  The inner core was purged with heparinized saline infusion. Also the delivery microcatheter was purged with heparinized saline infusion and retrogradely via a Tuohy Borst and a 3 way stopcock to ensure absence of any air.  The system was then advanced as a unit over the exchange micro guidewire without difficulty.  The distal and the proximal markers of the stent were then advanced such  that they were adequate distant at the site of the angioplastied segment.  The Tuohy Borst at the hub of the microcatheter was then loosened. The inner core was then advanced with its butt at the proximal portion of the stent. Thereafter, whilst holding the inner core of the stent, the delivery microcatheter was retrieved unsheathing the distal and then the proximal portion of the stent. Once delivered, a control arteriogram performed  through the 5 [place] Catalyst guide catheter demonstrated excellent apposition of the stent and coverage proximally and distally. Wide patency of the left middle cerebral artery angioplasty and stent segment was seen of almost 70-80%.  The delivery micro guidewire was then retrieved and removed.  Control arteriograms performed through the left internal carotid artery continued to demonstrate excellent flow through left MCA distribution without evidence of intraluminal filling defects or of occlusions involving the anterior or the middle cerebral artery distributions.  6 mg of intra-arterial Integrilin were given following the placement of the Wingspan stent system.  A control arteriogram performed through the 6 French Pinnacle sheath following removal of the Catalyst guide catheter now demonstrated the previously angioplastied segment of the left internal carotid artery proximally. The exchange micro guidewire had been maintained distally in the horizontal petrous segment.  Noted was modest recoil at the site of the previous angioplasty. It was, therefore, decided to proceed with placement of a stent across the angioplastied segment of the previously severely stenotic left internal carotid artery.  Over a 0.014 inch exchange micro guidewire, with the distal end in the petrous horizontal segment, measurements were performed of the proximal and the distal landing zones of the planned stent. After evaluating the measurements obtained, it was decided to proceed with placement of a  9-7 mm x 40 mm Xact stent. This was prepped and purged retrogradely with heparinized saline infusion. Using the rapid exchange technique, the stent delivery system was advanced without difficulty and positioned such that the distal and the proximal markers were adequate distant from the site of the previous angioplasty segment. This stent was then deployed without any difficulty in the usual manner. The delivery catheter system was then retrieved and removed whilst the exchange micro guidewire was maintained distally.  A control arteriogram performed through the 6 French Pinnacle sheath immediately and 10 and 20 minutes placement of the stent continued to demonstrate excellent flow through the stented segment with mild spasm at its distal end. Because of the risk of development of platelet aggregation within the stent, it was decided to give additional 6 mg of Integrilin intra-arterially.  A final control arteriogram performed approximately 25 minutes following placement of the left internal carotid artery stent continued to demonstrate excellent flow intracranially and extra cranially. 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 The left middle cerebral artery angioplasty in the stented segment now demonstrated close to 80% patency without gross evidence of intraluminal filling defects or of occlusions.  Throughout the procedure, the patient's blood pressure and neurological status remained stable. The  patient's ACT was maintained in the region of approximately 130 seconds throughout the procedure.  A flat panel CT of the brain performed with the patient on the table demonstrated no evidence of intracranial hemorrhage, mass effect or midline shift.  The patient was then returned to the OR for removal of the 6 French Pinnacle sheath by vascular surgery as per plan.  It was planned to a keep the patient intubated on ventilator for airway protection  given the antiplatelets and anticoagulation given for the entirety of the procedure.  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  PLAN: Patient  to remain intubated for airway protection given the direct left carotid artery access and use of antiplatelets, and anticoagulation.   Electronically Signed   By: Luanne Bras M.D.   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  EXAM: TRANSCATHETER THERAPY EMBOLIZATION  COMPARISON:  Diagnostic catheter arteriogram of March 21, 2019.  MEDICATIONS: Heparin 1000 units IV. Ancef 4 g IV antibiotic was administered within 1 hour of the procedure.  ANESTHESIA/SEDATION: General anesthesia.  CONTRAST:  Isovue 300 approximately 150 mL.  FLUOROSCOPY TIME:  Fluoroscopy Time: 101 minutes 0 seconds (4155 mGy).  COMPLICATIONS: None immediate.  TECHNIQUE: Informed written consent was obtained from the patient after a thorough discussion of the procedural risks, benefits and alternatives. All questions were addressed. Maximal Sterile Barrier Technique was utilized including caps, mask, sterile gowns, sterile gloves, sterile drape, hand hygiene and skin  antiseptic. A timeout was performed prior to the initiation of the procedure.  Direct access to the left common carotid artery was provided by vascular surgery in the OR due to patient's severe peripheral vascular disease, and absent flow in the right radial artery.  A 6 French Pinnacle sheath was inserted and sutured in the left common carotid artery in the mid section. The patient was brought to the angio suite with the 6 French Pinnacle sheath.  This was then connected to continuous heparinized saline infusion following flushing. An arteriogram was then obtained of the left common carotid bifurcation and also intracranially.  FINDINGS: The left common carotid bifurcation again demonstrates the left external carotid artery and its major branches to be widely patent.  The left internal carotid artery just distal to the bulb again demonstrates a significant stenosis just distal to the carotid bulb of approximately 70-75%.  More distally, the left internal carotid artery is seen to opacify to the cranial skull base.  The petrous segment is widely patent.  There is a mild to moderate stenosis of the distal cavernous segment of the left internal carotid artery and to a lesser degree of the supraclinoid left internal carotid artery. A left posterior communicating artery is seen opacifying the left posterior cerebral artery distribution.  The left middle cerebral artery again demonstrates a severe M1 segment stenosis with opacification of the left MCA trifurcation branches into the capillary and venous phases.  The left anterior cerebral artery demonstrates mild arteriosclerotic changes in the proximal A1 and A2 segments.  More distally, mild arteriosclerotic changes are also noted in the A3 segment of the left anterior cerebral artery pericallosal branch.  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  The subsequent capillary  and venous phases are unremarkable.  ENDOVASCULAR TREATMENT OF THE LEFT ANTERIOR CEREBRAL ARTERY PERICALLOSAL REGION ANEURYSM WITH STENT ASSISTED COILING  Initial treatment angioplasty of the left internal carotid approximately was performed following advancement of a 5 mm x 30 mm Viatrac 14 angioplasty microcatheter. This had been prepped and purged with heparinized saline infusion. Over a 014 inch standard Synchro micro guidewire with a J configuration, the combination of the Viatrac 14 angioplasty balloon  catheter in the micro guidewire advanced to the proximal left internal carotid artery.  Using a torque device, the micro guidewire was advanced without difficulty to the distal cervical segment of the left internal carotid artery.  This was then followed by the advancement of the Viatrac 5 x 30 mm balloon in place adequate distance at the site of severe stenosis.  Slow control angioplasty was then performed using micro inflation syringe device via micro tubing. Balloon was inflated to 8.3 atmospheres where it was maintained for approximately 1 minute. The balloon was then deflated and advanced more distally. A control arteriogram performed through 5 French Pinnacle sheath in the left common carotid artery again demonstrated significantly improved caliber and flow through the angioplastied segment.  The combination of the balloon and the microcatheter were advanced to the horizontal petrous segment of the left internal carotid artery.  The micro guidewire was removed, and the Viatrac balloon was then exchanged for an 014 inch 300 cm Softip Transend exchange guidewire. Over the exchange micro guidewire, a 5 French 115 cm Catalyst guide catheter was advanced and positioned in the horizontal petrous segment of the left internal carotid artery.  The exchange micro guidewire was then removed. Good aspiration was obtained from the hub of the 5 [place] Catalyst guide catheter in the left internal carotid artery.  A control arteriogram performed through the 5 [place] Catalyst guide catheter in the left internal carotid artery demonstrated no changes in the intracranial circulation.  Over a 0.014 inch standard Synchro micro guidewire with a J configuration, a Headway 17 2 tip microcatheter was advanced without difficulty to the supraclinoid left ICA.  The micro guidewire was then gently manipulated with a torque device and advanced through left anterior cerebral artery A1 A2 segments followed by the microcatheter to the A3 segment of the anterior cerebral artery pericallosal branch. The wire was then advanced without difficulty distal to the neck of the aneurysm followed by the microcatheter. The guidewire was removed. Good aspiration obtained from the hub of microcatheter. A gentle control arteriogram performed through the microcatheter demonstrates safe position of tip of the microcatheter. This was then connected to continuous heparinized saline infusion. At this time after having obtained measurements of the pericallosal artery distal and proximal to the aneurysm, a 2.5 mm x 23 mm Lvis junior stent was advanced to the distal end of the microcatheter.  Under constant fluoroscopic guidance using roadmap technique, the delivery microcatheter was retrieved unsheathing the distal and then the proximal Lvis stent. A control arteriogram performed through the 5 [place] Catalyst guide catheter now advanced to the A1 segment of the left anterior cerebral artery demonstrates safe positioning and excellent apposition of the Lvis stent. At this time, the 014 inch standard Synchro micro guidewire was then advanced through the microcatheter into the aneurysm without difficulty followed by the microcatheter. The guidewire was removed. Good aspiration was obtained with the intra aneurysmal microcatheter. This was then connected to continuous heparinized saline infusion.  The first coil utilized for aneurysm embolization was a Microplex  10 5 mm x 15 mm HyperSoft 3D coil. This was advanced using biplane roadmap technique and constant fluoroscopic guidance in a coaxial manner and with constant heparinized saline infusion to the distal end of the intra-aneurysmal microcatheter. This coil was then advanced under constant fluoroscopic guidance. Prior to its detachment, a control arteriogram performed through the 5 [place] Catalyst guide catheter in the left anterior cerebral artery demonstrates safe positioning of the coil. This was then detached without  difficulty. This was subsequently followed by advancement of a 4 mm x 8 mm HyperSoft 3D coil, a 2.5 mm x 8 cm HyperSoft 3D coil, a 3 mm x 6 cm HyperSoft 3D coil, a 3 mm x 4 cm Microplex HyperSoft 3D coil and finally a 2 mm x 6 cm Microplex HyperSoft helical coil. Each of these coils was advanced into the aneurysm under fluoroscopic guidance using biplane roadmap technique. Prior to their detachment, a control arteriogram was performed through the guide catheter to ensure safe positioning of the coil mass.  Following the final coil there was near complete obliteration of the aneurysm.  The microcatheter was gently retrieved from the coil mass without any difficulty. A control arteriogram performed demonstrated near complete occlusion of the aneurysm with wide patency of the Lvis stent across the neck of the aneurysm.  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  Using a torque device, access through the nearly completely occluded left middle cerebral artery was achieved with the micro guidewire which  was advanced to the distal M2 M3 region of the inferior division. The balloon was then advanced without difficulty and positioned such that the proximal and the distal markers were adequate distant and covered the segmental high-grade stenosis.  A control angioplasty was then performed using micro inflation syringe device via micro tubing. Slow increments were performed in the atmospheric pressures of the balloon to 4.9 atmospheres achieving approximately 1.95 mm diameter of the balloon. This was maintained for approximately 90 seconds. The balloon was then gently deflated and retrieved proximally whilst the wire was maintained distally in the M2 M3 region.  A control arteriogram performed through the 5 [place] Catalyst guide catheter demonstrated significantly improved caliber and flow through the angioplastied segment. The Gateway balloon was then advanced to the M2 M3 region over the micro guidewire. The micro guidewire was then retrieved. Free aspiration of blood was noted from the hub of the Gateway microcatheter.  This was in turn exchanged for a 014 inch 300 cm Softip Transend exchange micro guidewire using biplane roadmap technique and constant fluoroscopic guidance. The micro guidewire had a J shaped configuration to avoid dissections or inducing spasm.  A control arteriogram performed through the Catalyst guide catheter in the left internal carotid artery following the exchange micro guidewire placement demonstrated significantly improved caliber and flow through the angioplastied segment to approximately 60-70% patency.  It was elected to proceed with placement of a Wingspan stent across the angioplastied segment of the left middle cerebral artery.  A 3.5 mm x 20 mm Wingspan system was then prepped and purged with heparinized saline infusion in its housing.  The inner core was purged with heparinized saline infusion. Also the delivery microcatheter was purged with heparinized saline infusion and  retrogradely via a Tuohy Borst and a 3 way stopcock to ensure absence of any air.  The system was then advanced as a unit over the exchange micro guidewire without difficulty.  The distal and the proximal markers of the stent were then advanced such that they were adequate distant at the site of the angioplastied segment.  The Tuohy Borst at  the hub of the microcatheter was then loosened. The inner core was then advanced with its butt at the proximal portion of the stent. Thereafter, whilst holding the inner core of the stent, the delivery microcatheter was retrieved unsheathing the distal and then the proximal portion of the stent. Once delivered, a control arteriogram performed through the 5 [place] Catalyst guide catheter demonstrated excellent apposition of the stent and coverage proximally and distally. Wide patency of the left middle cerebral artery angioplasty and stent segment was seen of almost 70-80%.  The delivery micro guidewire was then retrieved and removed.  Control arteriograms performed through the left internal carotid artery continued to demonstrate excellent flow through left MCA distribution without evidence of intraluminal filling defects or of occlusions involving the anterior or the middle cerebral artery distributions.  6 mg of intra-arterial Integrilin were given following the placement of the Wingspan stent system.  A control arteriogram performed through the 6 French Pinnacle sheath following removal of the Catalyst guide catheter now demonstrated the previously angioplastied segment of the left internal carotid artery proximally. The exchange micro guidewire had been maintained distally in the horizontal petrous segment.  Noted was modest recoil at the site of the previous angioplasty. It was, therefore, decided to proceed with placement of a stent across the angioplastied segment of the previously severely stenotic left internal carotid artery.  Over a 0.014 inch exchange micro  guidewire, with the distal end in the petrous horizontal segment, measurements were performed of the proximal and the distal landing zones of the planned stent. After evaluating the measurements obtained, it was decided to proceed with placement of a 9-7 mm x 40 mm Xact stent. This was prepped and purged retrogradely with heparinized saline infusion. Using the rapid exchange technique, the stent delivery system was advanced without difficulty and positioned such that the distal and the proximal markers were adequate distant from the site of the previous angioplasty segment. This stent was then deployed without any difficulty in the usual manner. The delivery catheter system was then retrieved and removed whilst the exchange micro guidewire was maintained distally.  A control arteriogram performed through the 6 [place] Pinnacle sheath immediately and 10 and 20 minutes placement of the stent continued to demonstrate excellent flow through the stented segment with mild spasm at its distal end. Because of the risk of development of platelet aggregation within the stent, it was decided to give additional 6 mg of Integrilin intra-arterially.  A final control arteriogram performed approximately 25 minutes following placement of the left internal carotid artery stent continued to demonstrate excellent flow intracranially and extra cranially. 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 The left middle cerebral artery angioplasty in the stented segment now demonstrated close to 80% patency without gross evidence of intraluminal filling defects or of occlusions.  Throughout the procedure, the patient's blood pressure and neurological status remained stable. The  patient's ACT was maintained in the region of approximately 130 seconds throughout the procedure.  A flat panel CT of the brain performed with the patient on the table demonstrated no evidence of  intracranial hemorrhage, mass effect or midline shift.  The patient was then returned to the OR for removal of the 6 French Pinnacle sheath by vascular surgery as per plan.  It was planned to a keep the patient intubated on ventilator for airway protection given the antiplatelets and anticoagulation given for the entirety of the procedure.  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  PLAN: Patient to remain intubated for airway protection given the direct left carotid artery access and use of antiplatelets, and anticoagulation.   Electronically Signed   By: Luanne Bras M.D.   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  EXAM: TRANSCATHETER THERAPY EMBOLIZATION  COMPARISON:  Diagnostic catheter arteriogram of March 21, 2019.  MEDICATIONS: Heparin 1000 units IV. Ancef 4 g IV antibiotic was administered within 1 hour of the procedure.  ANESTHESIA/SEDATION: General anesthesia.  CONTRAST:  Isovue 300 approximately 150 mL.  FLUOROSCOPY TIME:  Fluoroscopy Time: 101 minutes 0 seconds (4155 mGy).  COMPLICATIONS: None immediate.  TECHNIQUE: Informed written consent was obtained from the  patient after a thorough discussion of the procedural risks, benefits and alternatives. All questions were addressed. Maximal Sterile Barrier Technique was utilized including caps, mask, sterile gowns, sterile gloves, sterile drape, hand hygiene and skin antiseptic. A timeout was performed prior to the initiation of the procedure.  Direct access to the left common carotid artery was provided by vascular surgery in the OR due to patient's severe peripheral vascular disease, and absent flow in the right radial artery.  A 6 French Pinnacle sheath was inserted and sutured in the left common carotid artery in the mid section. The patient was brought to the angio suite with the 6 [place] Pinnacle sheath.  This was then connected to continuous heparinized saline infusion following flushing. An arteriogram was then obtained of the left common carotid bifurcation and also intracranially.  FINDINGS: The left common carotid bifurcation again demonstrates the left external carotid artery and its major branches to be widely patent.  The left internal carotid artery just distal to the bulb again demonstrates a significant stenosis just distal to the carotid bulb of approximately 70-75%.  More distally, the left internal carotid artery is seen to opacify to the cranial skull base.  The petrous segment is widely patent.  There is a mild to moderate stenosis of the distal cavernous segment of the left internal carotid artery and to a lesser degree of the supraclinoid left internal carotid artery. A left posterior communicating artery is seen opacifying the left posterior cerebral artery distribution.  The left middle cerebral artery again demonstrates a severe M1 segment stenosis with opacification of the left MCA trifurcation branches into the capillary and venous phases.  The left anterior cerebral artery demonstrates mild arteriosclerotic changes in the proximal A1 and A2 segments.  More distally, mild arteriosclerotic  changes are also noted in the A3 segment of the left anterior cerebral artery pericallosal branch.  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  The subsequent capillary and venous phases are unremarkable.  ENDOVASCULAR TREATMENT OF THE LEFT ANTERIOR CEREBRAL ARTERY PERICALLOSAL REGION ANEURYSM WITH STENT ASSISTED COILING  Initial treatment angioplasty of the left internal carotid approximately was performed following advancement of a 5 mm x 30 mm Viatrac 14 angioplasty microcatheter. This had been prepped and purged with heparinized saline infusion. Over a 014 inch standard Synchro micro guidewire with a J configuration, the combination of the Viatrac 14 angioplasty balloon catheter in the micro guidewire advanced to the proximal left internal carotid artery.  Using a torque device, the micro guidewire was advanced without difficulty to the distal cervical segment of the left internal carotid artery.  This was then followed by the advancement of the Viatrac 5 x 30 mm balloon in place adequate distance at the site of severe stenosis.  Slow control angioplasty was then performed using micro inflation syringe device via micro tubing. Balloon was inflated to 8.3 atmospheres where it was maintained for approximately 1 minute. The balloon was then deflated and advanced more distally. A control arteriogram performed through 5 French Pinnacle sheath in the left common carotid artery again demonstrated significantly improved caliber and flow through the angioplastied segment.  The combination of the balloon and the microcatheter were advanced to the horizontal petrous segment of the left internal carotid artery.  The micro guidewire was removed, and the Viatrac balloon was then exchanged for an 014 inch 300 cm Softip Transend exchange guidewire. Over the exchange micro guidewire, a 5 French 115 cm Catalyst guide catheter was advanced  and positioned in the horizontal petrous segment of the left internal carotid artery.  The exchange micro guidewire was then removed. Good aspiration was obtained from the hub of the 5 [place] Catalyst guide catheter in the left internal carotid artery. A control arteriogram performed through the 5 [place] Catalyst guide catheter in the left internal carotid artery demonstrated no changes in the intracranial circulation.  Over a 0.014 inch standard Synchro micro guidewire with a J configuration, a Headway 17 2 tip microcatheter was advanced without difficulty to the supraclinoid left ICA.  The micro guidewire was then gently manipulated with a torque device and advanced through left anterior cerebral artery A1 A2 segments followed by the microcatheter to the A3 segment of the anterior cerebral artery pericallosal branch. The wire was then advanced without difficulty distal to the neck of the aneurysm followed by the microcatheter. The guidewire was removed. Good aspiration obtained from the hub of microcatheter. A gentle control arteriogram performed through the microcatheter demonstrates safe position of tip of the microcatheter. This was then connected to continuous heparinized saline infusion. At this time after having obtained measurements of the pericallosal artery distal and proximal to the aneurysm, a 2.5 mm x 23 mm Lvis junior stent was advanced to the distal end of the microcatheter.  Under constant fluoroscopic guidance using roadmap technique, the delivery microcatheter was retrieved unsheathing the distal and then the proximal Lvis stent. A control arteriogram performed through the 5 [place] Catalyst guide catheter now advanced to the A1 segment of the left anterior cerebral artery demonstrates safe positioning and excellent apposition of the Lvis stent. At this time, the 014 inch standard Synchro micro guidewire was then advanced through the microcatheter into the aneurysm without difficulty followed  by the microcatheter. The guidewire was removed. Good aspiration was obtained with the intra aneurysmal microcatheter. This was then connected to continuous heparinized saline infusion.  The first coil utilized for aneurysm embolization was a Microplex 10 5 mm x 15 mm HyperSoft 3D coil. This was advanced using biplane roadmap technique and constant fluoroscopic guidance in a coaxial manner and  with constant heparinized saline infusion to the distal end of the intra-aneurysmal microcatheter. This coil was then advanced under constant fluoroscopic guidance. Prior to its detachment, a control arteriogram performed through the 5 [place] Catalyst guide catheter in the left anterior cerebral artery demonstrates safe positioning of the coil. This was then detached without difficulty. This was subsequently followed by advancement of a 4 mm x 8 mm HyperSoft 3D coil, a 2.5 mm x 8 cm HyperSoft 3D coil, a 3 mm x 6 cm HyperSoft 3D coil, a 3 mm x 4 cm Microplex HyperSoft 3D coil and finally a 2 mm x 6 cm Microplex HyperSoft helical coil. Each of these coils was advanced into the aneurysm under fluoroscopic guidance using biplane roadmap technique. Prior to their detachment, a control arteriogram was performed through the guide catheter to ensure safe positioning of the coil mass.  Following the final coil there was near complete obliteration of the aneurysm.  The microcatheter was gently retrieved from the coil mass without any difficulty. A control arteriogram performed demonstrated near complete occlusion of the aneurysm with wide patency of the Lvis stent across the neck of the aneurysm.  ENDOVASCULAR STENT ASSISTED ANGIOPLASTY OF SYMPTOMATIC HIGH-GRADE STENOSIS OF THE LEFT MIDDLE CEREBRAL ARTERY M1 SEGMENT  Over a 0.014 inch standard Synchro micro guidewire, a 2 mm x 15 mm Gateway angioplasty balloon catheter which had been prepped with 50% contrast and 50% heparinized saline infusion was advanced through the 5 [place]  Catalyst guide catheter in the petrous segment of the left internal carotid artery to the supraclinoid left ICA.  Using a torque device, access through the nearly completely occluded left middle cerebral artery was achieved with the micro guidewire which was advanced to the distal M2 M3 region of the inferior division. The balloon was then advanced without difficulty and positioned such that the proximal and the distal markers were adequate distant and covered the segmental high-grade stenosis.  A control angioplasty was then performed using micro inflation syringe device via micro tubing. Slow increments were performed in the atmospheric pressures of the balloon to 4.9 atmospheres achieving approximately 1.95 mm diameter of the balloon. This was maintained for approximately 90 seconds. The balloon was then gently deflated and retrieved proximally whilst the wire was maintained distally in the M2 M3 region.  A control arteriogram performed through the 5 [place] Catalyst guide catheter demonstrated significantly improved caliber and flow through the angioplastied segment. The Gateway balloon was then advanced to the M2 M3 region over the micro guidewire. The micro guidewire was then retrieved. Free aspiration of blood was noted from the hub of the Gateway microcatheter.  This was in turn exchanged for a 014 inch 300 cm Softip Transend exchange micro guidewire using biplane roadmap technique and constant fluoroscopic guidance. The micro guidewire had a J shaped configuration to avoid dissections or inducing spasm.  A control arteriogram performed through the Catalyst guide catheter in the left internal carotid artery following the exchange micro guidewire placement demonstrated significantly improved caliber and flow through the angioplastied segment to approximately 60-70% patency.  It was elected to proceed with placement of a Wingspan stent across the angioplastied segment of the left middle cerebral artery.   A 3.5 mm x 20 mm Wingspan system was then prepped and purged with heparinized saline infusion in its housing.  The inner core was purged with heparinized saline infusion. Also the delivery microcatheter was purged with heparinized saline infusion and retrogradely via a Tuohy Borst and a 3 way  stopcock to ensure absence of any air.  The system was then advanced as a unit over the exchange micro guidewire without difficulty.  The distal and the proximal markers of the stent were then advanced such that they were adequate distant at the site of the angioplastied segment.  The Tuohy Borst at the hub of the microcatheter was then loosened. The inner core was then advanced with its butt at the proximal portion of the stent. Thereafter, whilst holding the inner core of the stent, the delivery microcatheter was retrieved unsheathing the distal and then the proximal portion of the stent. Once delivered, a control arteriogram performed through the 5 [place] Catalyst guide catheter demonstrated excellent apposition of the stent and coverage proximally and distally. Wide patency of the left middle cerebral artery angioplasty and stent segment was seen of almost 70-80%.  The delivery micro guidewire was then retrieved and removed.  Control arteriograms performed through the left internal carotid artery continued to demonstrate excellent flow through left MCA distribution without evidence of intraluminal filling defects or of occlusions involving the anterior or the middle cerebral artery distributions.  6 mg of intra-arterial Integrilin were given following the placement of the Wingspan stent system.  A control arteriogram performed through the 6 French Pinnacle sheath following removal of the Catalyst guide catheter now demonstrated the previously angioplastied segment of the left internal carotid artery proximally. The exchange micro guidewire had been maintained distally in the horizontal petrous segment.  Noted was  modest recoil at the site of the previous angioplasty. It was, therefore, decided to proceed with placement of a stent across the angioplastied segment of the previously severely stenotic left internal carotid artery.  Over a 0.014 inch exchange micro guidewire, with the distal end in the petrous horizontal segment, measurements were performed of the proximal and the distal landing zones of the planned stent. After evaluating the measurements obtained, it was decided to proceed with placement of a 9-7 mm x 40 mm Xact stent. This was prepped and purged retrogradely with heparinized saline infusion. Using the rapid exchange technique, the stent delivery system was advanced without difficulty and positioned such that the distal and the proximal markers were adequate distant from the site of the previous angioplasty segment. This stent was then deployed without any difficulty in the usual manner. The delivery catheter system was then retrieved and removed whilst the exchange micro guidewire was maintained distally.  A control arteriogram performed through the 6 [place] Pinnacle sheath immediately and 10 and 20 minutes placement of the stent continued to demonstrate excellent flow through the stented segment with mild spasm at its distal end. Because of the risk of development of platelet aggregation within the stent, it was decided to give additional 6 mg of Integrilin intra-arterially.  A final control arteriogram performed approximately 25 minutes following placement of the left internal carotid artery stent continued to demonstrate excellent flow intracranially and extra cranially. 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 The left middle cerebral artery angioplasty in the stented segment now demonstrated close to 80% patency without gross evidence of intraluminal filling defects or of occlusions.  Throughout the procedure, the patient's blood pressure  and neurological status remained stable. The  patient's ACT was maintained in the region of approximately 130 seconds throughout the procedure.  A flat panel CT of the brain performed with the patient on the table demonstrated no evidence of intracranial hemorrhage, mass effect or midline shift.  The patient  was then returned to the OR for removal of the 6 French Pinnacle sheath by vascular surgery as per plan.  It was planned to a keep the patient intubated on ventilator for airway protection given the antiplatelets and anticoagulation given for the entirety of the procedure.  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  PLAN: Patient to remain intubated for airway protection given the direct left carotid artery access and use of antiplatelets, and anticoagulation.   Electronically Signed   By: Luanne Bras M.D.   On: 03/28/2019 09:53   US RENAL  Result Date: 03/24/2019 CLINICAL DATA:  Elevated BUN and creatinine EXAM: RENAL / URINARY TRACT ULTRASOUND COMPLETE COMPARISON:  None. FINDINGS: Right Kidney: Renal measurements: 10.4 x 5.4 x 5.4 cm = volume: 158 mL. 1.4 cm midpole cyst. Normal echotexture. No suspicious mass or hydronephrosis. Left Kidney: Renal measurements: 11.1 x 6.4 x 4.8 cm = volume: 179 mL. Echogenicity within normal limits. No mass or hydronephrosis visualized. Bladder: Decompressed with Foley catheter in place. Other: None. IMPRESSION: No acute findings.  No hydronephrosis. Electronically Signed   By: Rolm Baptise M.D.   On: 03/24/2019 21:58   IR INTRAVSC STENT CERV CAROTID W/O EMB-PROT  MOD SED  Result Date: 03/26/2019 CLINICAL DATA:  Symptomatic high-grade stenosis of the left middle cerebral artery proximal M1 segment with history of recent ischemic stroke.  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  EXAM: TRANSCATHETER THERAPY EMBOLIZATION  COMPARISON:  Diagnostic catheter arteriogram of March 21, 2019.  MEDICATIONS: Heparin 1000 units IV. Ancef 4 g IV antibiotic was administered within 1 hour of the procedure.  ANESTHESIA/SEDATION: General anesthesia.  CONTRAST:  Isovue 300 approximately 150 mL.  FLUOROSCOPY TIME:  Fluoroscopy Time: 101 minutes 0 seconds (4155 mGy).  COMPLICATIONS: None immediate.  TECHNIQUE: Informed written consent was obtained from the patient after a thorough discussion of the procedural risks, benefits and alternatives. All questions were addressed. Maximal Sterile Barrier Technique was utilized including caps, mask, sterile gowns, sterile gloves, sterile drape, hand hygiene and skin antiseptic. A timeout was performed prior to the initiation of the procedure.  Direct access to the left common carotid artery was provided by vascular surgery in the OR due to patient's severe peripheral vascular disease, and absent flow in the right radial artery.  A 6 French Pinnacle sheath was inserted and sutured in the left common carotid artery in the mid section. The patient was brought to the angio suite with the 6 [place] Pinnacle sheath.  This was then connected to continuous heparinized saline infusion following flushing. An arteriogram was then obtained of the left common carotid bifurcation and also intracranially.  FINDINGS: The left common carotid bifurcation again demonstrates the left external carotid artery and its major branches to be widely patent.  The  left internal carotid artery just distal to the bulb again demonstrates a significant stenosis just distal to the carotid bulb of approximately 70-75%.  More distally, the left internal carotid artery is seen to opacify to the cranial skull base.  The petrous  segment is widely patent.  There is a mild to moderate stenosis of the distal cavernous segment of the left internal carotid artery and to a lesser degree of the supraclinoid left internal carotid artery. A left posterior communicating artery is seen opacifying the left posterior cerebral artery distribution.  The left middle cerebral artery again demonstrates a severe M1 segment stenosis with opacification of the left MCA trifurcation branches into the capillary and venous phases.  The left anterior cerebral artery demonstrates mild arteriosclerotic changes in the proximal A1 and A2 segments.  More distally, mild arteriosclerotic changes are also noted in the A3 segment of the left anterior cerebral artery pericallosal branch.  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  The subsequent capillary and venous phases are unremarkable.  ENDOVASCULAR TREATMENT OF THE LEFT ANTERIOR CEREBRAL ARTERY PERICALLOSAL REGION ANEURYSM WITH STENT ASSISTED COILING  Initial treatment angioplasty of the left internal carotid approximately was performed following advancement of a 5 mm x 30 mm Viatrac 14 angioplasty microcatheter. This had been prepped and purged with heparinized saline infusion. Over a 014 inch standard Synchro micro guidewire with a J configuration, the combination of the Viatrac 14 angioplasty balloon catheter in the micro guidewire advanced to the proximal left internal carotid artery.  Using a torque device, the micro guidewire was advanced without difficulty to the distal cervical segment of the left internal carotid artery.  This was then followed by the  advancement of the Viatrac 5 x 30 mm balloon in place adequate distance at the site of severe stenosis.  Slow control angioplasty was then performed using micro inflation syringe device via micro tubing. Balloon was inflated to 8.3 atmospheres where it was maintained for approximately 1 minute. The balloon was then deflated and advanced more distally. A control arteriogram performed through 5 [place] Pinnacle sheath in the left common carotid artery again demonstrated significantly improved caliber and flow through the angioplastied segment.  The combination of the balloon and the microcatheter were advanced to the horizontal petrous segment of the left internal carotid artery.  The micro guidewire was removed, and the Viatrac balloon was then exchanged for an 014 inch 300 cm Softip Transend exchange guidewire. Over the exchange micro guidewire, a 5 French 115 cm Catalyst guide catheter was advanced and positioned in the horizontal petrous segment of the left internal carotid artery.  The exchange micro guidewire was then removed. Good aspiration was obtained from the hub of the 5 [place] Catalyst guide catheter in the left internal carotid artery. A control arteriogram performed through the 5 [place] Catalyst guide catheter in the left internal carotid artery demonstrated no changes in the intracranial circulation.  Over a 0.014 inch standard Synchro micro guidewire with a J configuration, a Headway 17 2 tip microcatheter was advanced without difficulty to the supraclinoid left ICA.  The micro guidewire was then gently manipulated with a torque device and advanced through left anterior cerebral artery A1 A2 segments followed by the microcatheter to the A3 segment of the anterior cerebral artery pericallosal branch. The wire was then advanced without difficulty distal to the neck of the aneurysm followed by the microcatheter. The guidewire was removed. Good aspiration obtained from the hub of microcatheter. A  gentle control arteriogram performed through the microcatheter demonstrates safe position of tip of the microcatheter. This was then connected to continuous heparinized saline infusion. At this time after having obtained measurements of the pericallosal  artery distal and proximal to the aneurysm, a 2.5 mm x 23 mm Lvis junior stent was advanced to the distal end of the microcatheter.  Under constant fluoroscopic guidance using roadmap technique, the delivery microcatheter was retrieved unsheathing the distal and then the proximal Lvis stent. A control arteriogram performed through the 5 [place] Catalyst guide catheter now advanced to the A1 segment of the left anterior cerebral artery demonstrates safe positioning and excellent apposition of the Lvis stent. At this time, the 014 inch standard Synchro micro guidewire was then advanced through the microcatheter into the aneurysm without difficulty followed by the microcatheter. The guidewire was removed. Good aspiration was obtained with the intra aneurysmal microcatheter. This was then connected to continuous heparinized saline infusion.  The first coil utilized for aneurysm embolization was a Microplex 10 5 mm x 15 mm HyperSoft 3D coil. This was advanced using biplane roadmap technique and constant fluoroscopic guidance in a coaxial manner and with constant heparinized saline infusion to the distal end of the intra-aneurysmal microcatheter. This coil was then advanced under constant fluoroscopic guidance. Prior to its detachment, a control arteriogram performed through the 5 [place] Catalyst guide catheter in the left anterior cerebral artery demonstrates safe positioning of the coil. This was then detached without difficulty. This was subsequently followed by advancement of a 4 mm x 8 mm HyperSoft 3D coil, a 2.5 mm x 8 cm HyperSoft 3D coil, a 3 mm x 6 cm HyperSoft 3D coil, a 3 mm x 4 cm Microplex HyperSoft 3D coil and finally a 2 mm x 6 cm Microplex HyperSoft  helical coil. Each of these coils was advanced into the aneurysm under fluoroscopic guidance using biplane roadmap technique. Prior to their detachment, a control arteriogram was performed through the guide catheter to ensure safe positioning of the coil mass.  Following the final coil there was near complete obliteration of the aneurysm.  The microcatheter was gently retrieved from the coil mass without any difficulty. A control arteriogram performed demonstrated near complete occlusion of the aneurysm with wide patency of the Lvis stent across the neck of the aneurysm.  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  Using a torque device, access through the nearly completely occluded left middle cerebral artery was achieved with the micro guidewire which was advanced to the distal M2 M3 region of the inferior division. The balloon was then advanced without difficulty and positioned such that the proximal and the distal markers were adequate distant and covered the segmental high-grade stenosis.  A control angioplasty was then performed using micro inflation syringe device via micro tubing. Slow increments were performed in the atmospheric pressures of the balloon to 4.9 atmospheres achieving approximately 1.95 mm diameter of the balloon. This was maintained for approximately 90 seconds. The balloon was then gently deflated and retrieved proximally whilst the wire was maintained distally in the M2 M3 region.  A control arteriogram performed through the 5 [place] Catalyst guide catheter demonstrated significantly  improved caliber and flow through the angioplastied segment. The Gateway balloon was then advanced to the M2 M3 region over the micro guidewire. The micro guidewire was  then retrieved. Free aspiration of blood was noted from the hub of the Gateway microcatheter.  This was in turn exchanged for a 014 inch 300 cm Softip Transend exchange micro guidewire using biplane roadmap technique and constant fluoroscopic guidance. The micro guidewire had a J shaped configuration to avoid dissections or inducing spasm.  A control arteriogram performed through the Catalyst guide catheter in the left internal carotid artery following the exchange micro guidewire placement demonstrated significantly improved caliber and flow through the angioplastied segment to approximately 60-70% patency.  It was elected to proceed with placement of a Wingspan stent across the angioplastied segment of the left middle cerebral artery.  A 3.5 mm x 20 mm Wingspan system was then prepped and purged with heparinized saline infusion in its housing.  The inner core was purged with heparinized saline infusion. Also the delivery microcatheter was purged with heparinized saline infusion and retrogradely via a Tuohy Borst and a 3 way stopcock to ensure absence of any air.  The system was then advanced as a unit over the exchange micro guidewire without difficulty.  The distal and the proximal markers of the stent were then advanced such that they were adequate distant at the site of the angioplastied segment.  The Tuohy Borst at the hub of the microcatheter was then loosened. The inner core was then advanced with its butt at the proximal portion of the stent. Thereafter, whilst holding the inner core of the stent, the delivery microcatheter was retrieved unsheathing the distal and then the proximal portion of the stent. Once delivered, a control arteriogram performed through the 5 [place] Catalyst guide catheter demonstrated excellent apposition of  the stent and coverage proximally and distally. Wide patency of the left middle cerebral artery angioplasty and stent segment was seen of almost 70-80%.  The delivery micro guidewire was then retrieved and removed.  Control arteriograms performed through the left internal carotid artery continued to demonstrate excellent flow through left MCA distribution without evidence of intraluminal filling defects or of occlusions involving the anterior or the middle cerebral artery distributions.  6 mg of intra-arterial Integrilin were given following the placement of the Wingspan stent system.  A control arteriogram performed through the 6 French Pinnacle sheath following removal of the Catalyst guide catheter now demonstrated the previously angioplastied segment of the left internal carotid artery proximally. The exchange micro guidewire had been maintained distally in the horizontal petrous segment.  Noted was modest recoil at the site of the previous angioplasty. It was, therefore, decided to proceed with placement of a stent across the angioplastied segment of the previously severely stenotic left internal carotid artery.  Over a 0.014 inch exchange micro guidewire, with the distal end in the petrous horizontal segment, measurements were performed of the proximal and the distal landing zones of the planned stent. After evaluating the measurements obtained, it was decided to proceed with placement of a 9-7 mm x 40 mm Xact stent. This was prepped and purged retrogradely with heparinized saline infusion. Using the rapid exchange technique, the stent delivery system was advanced without difficulty and positioned such that the distal and the proximal markers were adequate distant from the site of the previous angioplasty segment. This stent was then deployed without any difficulty in the usual manner. The delivery catheter system was then retrieved and removed whilst the exchange micro guidewire was maintained distally.   A control arteriogram performed through the 6 French Pinnacle sheath immediately and 10 and 20 minutes placement of the stent continued  to demonstrate excellent flow through the stented segment with mild spasm at its distal end. Because of the risk of development of platelet aggregation within the stent, it was decided to give additional 6 mg of Integrilin intra-arterially.  A final control arteriogram performed approximately 25 minutes following placement of the left internal carotid artery stent continued to demonstrate excellent flow intracranially and extra cranially. 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 The left middle cerebral artery angioplasty in the stented segment now demonstrated close to 80% patency without gross evidence of intraluminal filling defects or of occlusions.  Throughout the procedure, the patient's blood pressure and neurological status remained stable. The  patient's ACT was maintained in the region of approximately 130 seconds throughout the procedure.  A flat panel CT of the brain performed with the patient on the table demonstrated no evidence of intracranial hemorrhage, mass effect or midline shift.  The patient was then returned to the OR for removal of the 6 French Pinnacle sheath by vascular surgery as per plan.  It was planned to a keep the patient intubated on ventilator for airway protection given the antiplatelets and anticoagulation given for the entirety of the procedure.  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  Status post endovascular stent assisted angioplasty of high-grade left internal carotid artery stenosis  proximally.  PLAN: Patient to remain intubated for airway protection given the direct left carotid artery access and use of antiplatelets, and anticoagulation.   Electronically Signed   By: Luanne Bras M.D.   On: 03/28/2019 09:53   IR Intra Cran Stent  Result Date: 03/26/2019 CLINICAL DATA:  Symptomatic high-grade stenosis of the left middle cerebral artery proximal M1 segment with history of recent ischemic stroke.  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  EXAM: TRANSCATHETER THERAPY EMBOLIZATION  COMPARISON:  Diagnostic catheter arteriogram of March 21, 2019.  MEDICATIONS: Heparin 1000 units IV. Ancef 4 g IV antibiotic was administered within 1 hour of the procedure.  ANESTHESIA/SEDATION: General anesthesia.  CONTRAST:  Isovue 300 approximately 150 mL.  FLUOROSCOPY TIME:  Fluoroscopy Time: 101 minutes 0 seconds (4155 mGy).  COMPLICATIONS: None immediate.  TECHNIQUE: Informed written consent was obtained from the patient after a thorough discussion of the procedural risks, benefits and alternatives. All questions were addressed. Maximal Sterile Barrier Technique was utilized including caps, mask, sterile gowns, sterile gloves, sterile drape, hand hygiene and skin antiseptic. A timeout was performed prior to the initiation of the procedure.  Direct access to the left common carotid artery was provided by vascular surgery in the OR due to patient's severe peripheral vascular disease, and absent flow in the right radial artery.  A 6 French Pinnacle sheath was inserted and sutured in the left common carotid artery in the mid section. The patient was brought to the angio suite with the 6 French Pinnacle sheath.  This was then connected to continuous heparinized saline infusion following  flushing. An arteriogram was then obtained of the left common carotid bifurcation and also intracranially.  FINDINGS: The left common carotid bifurcation again demonstrates the left external carotid  artery and its major branches to be widely patent.  The left internal carotid artery just distal to the bulb again demonstrates a significant stenosis just distal to the carotid bulb of approximately 70-75%.  More distally, the left internal carotid artery is seen to opacify to the cranial skull base.  The petrous segment is widely patent.  There is a mild to moderate stenosis of the distal cavernous segment of the left internal carotid artery and to a lesser degree of the supraclinoid left internal carotid artery. A left posterior communicating artery is seen opacifying the left posterior cerebral artery distribution.  The left middle cerebral artery again demonstrates a severe M1 segment stenosis with opacification of the left MCA trifurcation branches into the capillary and venous phases.  The left anterior cerebral artery demonstrates mild arteriosclerotic changes in the proximal A1 and A2 segments.  More distally, mild arteriosclerotic changes are also noted in the A3 segment of the left anterior cerebral artery pericallosal branch.  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  The subsequent capillary and venous phases are unremarkable.  ENDOVASCULAR TREATMENT OF THE LEFT ANTERIOR CEREBRAL ARTERY PERICALLOSAL REGION ANEURYSM WITH STENT ASSISTED COILING  Initial treatment angioplasty of the left internal carotid approximately was performed following advancement of a 5 mm x 30 mm Viatrac 14 angioplasty microcatheter. This had been prepped and purged with heparinized saline infusion. Over a 014 inch standard Synchro micro guidewire with a J configuration, the combination of the Viatrac 14 angioplasty balloon catheter in the  micro guidewire advanced to the proximal left internal carotid artery.  Using a torque device, the micro guidewire was advanced without difficulty to the distal cervical segment of the left internal carotid artery.  This was then followed by the advancement of the Viatrac 5 x 30 mm balloon in place adequate distance at the site of severe stenosis.  Slow control angioplasty was then performed using micro inflation syringe device via micro tubing. Balloon was inflated to 8.3 atmospheres where it was maintained for approximately 1 minute. The balloon was then deflated and advanced more distally. A control arteriogram performed through 5 French Pinnacle sheath in the left common carotid artery again demonstrated significantly improved caliber and flow through the angioplastied segment.  The combination of the balloon and the microcatheter were advanced to the horizontal petrous segment of the left internal carotid artery.  The micro guidewire was removed, and the Viatrac balloon was then exchanged for an 014 inch 300 cm Softip Transend exchange guidewire. Over the exchange micro guidewire, a 5 French 115 cm Catalyst guide catheter was advanced and positioned in the horizontal petrous segment of the left internal carotid artery.  The exchange micro guidewire was then removed. Good aspiration was obtained from the hub of the 5 [place] Catalyst guide catheter in the left internal carotid artery. A control arteriogram performed through the 5 [place] Catalyst guide catheter in the left internal carotid artery demonstrated no changes in the intracranial circulation.  Over a 0.014 inch standard Synchro micro guidewire with a J configuration, a Headway 17 2 tip microcatheter was advanced without difficulty to the supraclinoid left ICA.  The micro guidewire was then gently manipulated with a torque device and advanced through left anterior cerebral artery A1 A2 segments followed by the microcatheter to the A3 segment of  the anterior cerebral artery pericallosal branch. The wire was then advanced without difficulty distal to the neck  of the aneurysm followed by the microcatheter. The guidewire was removed. Good aspiration obtained from the hub of microcatheter. A gentle control arteriogram performed through the microcatheter demonstrates safe position of tip of the microcatheter. This was then connected to continuous heparinized saline infusion. At this time after having obtained measurements of the pericallosal artery distal and proximal to the aneurysm, a 2.5 mm x 23 mm Lvis junior stent was advanced to the distal end of the microcatheter.  Under constant fluoroscopic guidance using roadmap technique, the delivery microcatheter was retrieved unsheathing the distal and then the proximal Lvis stent. A control arteriogram performed through the 5 [place] Catalyst guide catheter now advanced to the A1 segment of the left anterior cerebral artery demonstrates safe positioning and excellent apposition of the Lvis stent. At this time, the 014 inch standard Synchro micro guidewire was then advanced through the microcatheter into the aneurysm without difficulty followed by the microcatheter. The guidewire was removed. Good aspiration was obtained with the intra aneurysmal microcatheter. This was then connected to continuous heparinized saline infusion.  The first coil utilized for aneurysm embolization was a Microplex 10 5 mm x 15 mm HyperSoft 3D coil. This was advanced using biplane roadmap technique and constant fluoroscopic guidance in a coaxial manner and with constant heparinized saline infusion to the distal end of the intra-aneurysmal microcatheter. This coil was then advanced under constant fluoroscopic guidance. Prior to its detachment, a control arteriogram performed through the 5 [place] Catalyst guide catheter in the left anterior cerebral artery demonstrates safe positioning of the coil. This was then detached without  difficulty. This was subsequently followed by advancement of a 4 mm x 8 mm HyperSoft 3D coil, a 2.5 mm x 8 cm HyperSoft 3D coil, a 3 mm x 6 cm HyperSoft 3D coil, a 3 mm x 4 cm Microplex HyperSoft 3D coil and finally a 2 mm x 6 cm Microplex HyperSoft helical coil. Each of these coils was advanced into the aneurysm under fluoroscopic guidance using biplane roadmap technique. Prior to their detachment, a control arteriogram was performed through the guide catheter to ensure safe positioning of the coil mass.  Following the final coil there was near complete obliteration of the aneurysm.  The microcatheter was gently retrieved from the coil mass without any difficulty. A control arteriogram performed demonstrated near complete occlusion of the aneurysm with wide patency of the Lvis stent across the neck of the aneurysm.  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  Using a torque device, access through the nearly completely occluded left middle cerebral artery was achieved with the micro guidewire which was advanced to the distal M2 M3 region of the inferior division. The balloon was then advanced without difficulty and positioned such that the proximal and the distal markers were adequate distant and covered the segmental high-grade stenosis.  A control angioplasty was then performed using micro inflation syringe device via micro tubing. Slow increments were performed in the atmospheric pressures of the balloon to 4.9 atmospheres achieving approximately 1.95 mm diameter of the balloon. This was maintained for  approximately 90 seconds. The balloon was then gently deflated and  retrieved proximally whilst the wire was maintained distally in the M2 M3 region.  A control arteriogram performed through the 5 [place] Catalyst guide catheter demonstrated significantly improved caliber and flow through the angioplastied segment. The Gateway balloon was then advanced to the M2 M3 region over the micro guidewire. The micro guidewire was then retrieved. Free aspiration of blood was noted from the hub of the Gateway microcatheter.  This was in turn exchanged for a 014 inch 300 cm Softip Transend exchange micro guidewire using biplane roadmap technique and constant fluoroscopic guidance. The micro guidewire had a J shaped configuration to avoid dissections or inducing spasm.  A control arteriogram performed through the Catalyst guide catheter in the left internal carotid artery following the exchange micro guidewire placement demonstrated significantly improved caliber and flow through the angioplastied segment to approximately 60-70% patency.  It was elected to proceed with placement of a Wingspan stent across the angioplastied segment of the left middle cerebral artery.  A 3.5 mm x 20 mm Wingspan system was then prepped and purged with heparinized saline infusion in its housing.  The inner core was purged with heparinized saline infusion. Also the delivery microcatheter was purged with heparinized saline infusion and retrogradely via a Tuohy Borst and a 3 way stopcock to ensure absence of any air.  The system was then advanced as a unit over the exchange micro guidewire without difficulty.  The distal and the proximal markers of the stent were then advanced such that they were adequate distant at the site of the angioplastied segment.  The Tuohy Borst at the hub of the microcatheter was then loosened. The inner core was then advanced with its butt at the proximal portion of the stent. Thereafter, whilst holding the inner core  of the stent, the delivery microcatheter was retrieved unsheathing the distal and then the proximal portion of the stent. Once delivered, a control arteriogram performed through the 5 [place] Catalyst guide catheter demonstrated excellent apposition of the stent and coverage proximally and distally. Wide patency of the left middle cerebral artery angioplasty and stent segment was seen of almost 70-80%.  The delivery micro guidewire was then retrieved and removed.  Control arteriograms performed through the left internal carotid artery continued to demonstrate excellent flow through left MCA distribution without evidence of intraluminal filling defects or of occlusions involving the anterior or the middle cerebral artery distributions.  6 mg of intra-arterial Integrilin were given following the placement of the Wingspan stent system.  A control arteriogram performed through the 6 French Pinnacle sheath following removal of the Catalyst guide catheter now demonstrated the previously angioplastied segment of the left internal carotid artery proximally. The exchange micro guidewire had been maintained distally in the horizontal petrous segment.  Noted was modest recoil at the site of the previous angioplasty. It was, therefore, decided to proceed with placement of a stent across the angioplastied segment of the previously severely stenotic left internal carotid artery.  Over a 0.014 inch exchange micro guidewire, with the distal end in the petrous horizontal segment, measurements were performed of the proximal and the distal landing zones of the planned stent. After evaluating the measurements obtained, it was decided to proceed with placement of a 9-7 mm x 40 mm Xact stent. This was prepped and purged retrogradely with heparinized saline infusion. Using the rapid exchange technique, the stent delivery system was advanced without difficulty and positioned such that the distal and the proximal markers were adequate  distant from the site of the  previous angioplasty segment. This stent was then deployed without any difficulty in the usual manner. The delivery catheter system was then retrieved and removed whilst the exchange micro guidewire was maintained distally.  A control arteriogram performed through the 6 [place] Pinnacle sheath immediately and 10 and 20 minutes placement of the stent continued to demonstrate excellent flow through the stented segment with mild spasm at its distal end. Because of the risk of development of platelet aggregation within the stent, it was decided to give additional 6 mg of Integrilin intra-arterially.  A final control arteriogram performed approximately 25 minutes following placement of the left internal carotid artery stent continued to demonstrate excellent flow intracranially and extra cranially. 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 The left middle cerebral artery angioplasty in the stented segment now demonstrated close to 80% patency without gross evidence of intraluminal filling defects or of occlusions.  Throughout the procedure, the patient's blood pressure and neurological status remained stable. The  patient's ACT was maintained in the region of approximately 130 seconds throughout the procedure.  A flat panel CT of the brain performed with the patient on the table demonstrated no evidence of intracranial hemorrhage, mass effect or midline shift.  The patient was then returned to the OR for removal of the 6 French Pinnacle sheath by vascular surgery as per plan.  It was planned to a keep the patient intubated on ventilator for airway protection given the antiplatelets and anticoagulation given for the entirety of the procedure.  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  Status post  angioplasty followed by stenting of the left middle cerebral artery severe symptomatic stenosis with 80% patency post angioplasty and stenting.  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  PLAN: Patient to remain intubated for airway protection given the direct left carotid artery access and use of antiplatelets, and anticoagulation.   Electronically Signed   By: Luanne Bras M.D.   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  EXAM: TRANSCATHETER THERAPY EMBOLIZATION  COMPARISON:  Diagnostic catheter arteriogram of March 21, 2019.  MEDICATIONS: Heparin 1000 units IV. Ancef 4 g IV antibiotic was administered within 1 hour of the procedure.  ANESTHESIA/SEDATION: General anesthesia.  CONTRAST:  Isovue 300 approximately 150 mL.  FLUOROSCOPY TIME:  Fluoroscopy Time: 101 minutes 0 seconds (4155 mGy).  COMPLICATIONS: None immediate.  TECHNIQUE: Informed written consent was obtained from the patient after a thorough discussion of the procedural risks, benefits and alternatives. All questions were addressed. Maximal Sterile Barrier Technique was utilized including caps, mask, sterile gowns, sterile gloves, sterile drape, hand hygiene and skin antiseptic. A timeout was performed prior to the initiation of the procedure.  Direct access to the left common carotid artery was provided by vascular surgery in the OR due to patient's severe peripheral vascular disease, and absent flow in the right radial artery.  A 6 French Pinnacle  sheath was inserted and sutured in the left common carotid artery in the mid  section. The patient was brought to the angio suite with the 6 [place] Pinnacle sheath.  This was then connected to continuous heparinized saline infusion following flushing. An arteriogram was then obtained of the left common carotid bifurcation and also intracranially.  FINDINGS: The left common carotid bifurcation again demonstrates the left external carotid artery and its major branches to be widely patent.  The left internal carotid artery just distal to the bulb again demonstrates a significant stenosis just distal to the carotid bulb of approximately 70-75%.  More distally, the left internal carotid artery is seen to opacify to the cranial skull base.  The petrous segment is widely patent.  There is a mild to moderate stenosis of the distal cavernous segment of the left internal carotid artery and to a lesser degree of the supraclinoid left internal carotid artery. A left posterior communicating artery is seen opacifying the left posterior cerebral artery distribution.  The left middle cerebral artery again demonstrates a severe M1 segment stenosis with opacification of the left MCA trifurcation branches into the capillary and venous phases.  The left anterior cerebral artery demonstrates mild arteriosclerotic changes in the proximal A1 and A2 segments.  More distally, mild arteriosclerotic changes are also noted in the A3 segment of the left anterior cerebral artery pericallosal branch.  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  The subsequent capillary and venous phases are unremarkable.  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 This had been prepped and purged with heparinized saline infusion. Over a 014 inch standard Synchro micro guidewire with a J configuration, the combination of the Viatrac 14 angioplasty balloon catheter in the micro guidewire advanced to the proximal left internal carotid artery.  Using a torque device, the micro guidewire was advanced without difficulty to the distal cervical segment of the left internal carotid artery.  This was then followed by the advancement of the Viatrac 5 x 30 mm balloon in place adequate distance at the site of severe stenosis.  Slow control angioplasty was then performed using micro inflation syringe device via micro tubing. Balloon was inflated to 8.3 atmospheres where it was maintained for approximately 1 minute. The balloon was then deflated and advanced more distally. A control arteriogram performed through 5 French Pinnacle sheath in the left common carotid artery again demonstrated significantly improved caliber and flow through the angioplastied segment.  The combination of the balloon and the microcatheter were advanced to the horizontal petrous segment of the left internal carotid artery.  The micro guidewire was removed, and the Viatrac balloon was then exchanged for an 014 inch 300 cm Softip Transend exchange guidewire. Over the exchange micro guidewire, a 5 French 115 cm Catalyst guide catheter was advanced and positioned in the horizontal petrous segment of the left internal carotid artery.  The exchange micro guidewire was then removed. Good aspiration was obtained from the hub of the 5 [place] Catalyst guide catheter in the left internal carotid artery. A control arteriogram performed through the 5 [place] Catalyst guide catheter in the left internal carotid artery demonstrated no changes in the intracranial circulation.  Over a 0.014 inch standard Synchro micro guidewire with a J configuration, a Headway 17 2 tip microcatheter was advanced  without  difficulty to the supraclinoid left ICA.  The micro guidewire was then gently manipulated with a torque device and advanced through left anterior cerebral artery A1 A2 segments followed by the microcatheter to the A3 segment of the anterior cerebral artery pericallosal branch. The wire was then advanced without difficulty distal to the neck of the aneurysm followed by the microcatheter. The guidewire was removed. Good aspiration obtained from the hub of microcatheter. A gentle control arteriogram performed through the microcatheter demonstrates safe position of tip of the microcatheter. This was then connected to continuous heparinized saline infusion. At this time after having obtained measurements of the pericallosal artery distal and proximal to the aneurysm, a 2.5 mm x 23 mm Lvis junior stent was advanced to the distal end of the microcatheter.  Under constant fluoroscopic guidance using roadmap technique, the delivery microcatheter was retrieved unsheathing the distal and then the proximal Lvis stent. A control arteriogram performed through the 5 [place] Catalyst guide catheter now advanced to the A1 segment of the left anterior cerebral artery demonstrates safe positioning and excellent apposition of the Lvis stent. At this time, the 014 inch standard Synchro micro guidewire was then advanced through the microcatheter into the aneurysm without difficulty followed by the microcatheter. The guidewire was removed. Good aspiration was obtained with the intra aneurysmal microcatheter. This was then connected to continuous heparinized saline infusion.  The first coil utilized for aneurysm embolization was a Microplex 10 5 mm x 15 mm HyperSoft 3D coil. This was advanced using biplane roadmap technique and constant fluoroscopic guidance in a coaxial manner and with constant heparinized saline infusion to the distal end of the intra-aneurysmal microcatheter. This coil was then advanced under constant  fluoroscopic guidance. Prior to its detachment, a control arteriogram performed through the 5 [place] Catalyst guide catheter in the left anterior cerebral artery demonstrates safe positioning of the coil. This was then detached without difficulty. This was subsequently followed by advancement of a 4 mm x 8 mm HyperSoft 3D coil, a 2.5 mm x 8 cm HyperSoft 3D coil, a 3 mm x 6 cm HyperSoft 3D coil, a 3 mm x 4 cm Microplex HyperSoft 3D coil and finally a 2 mm x 6 cm Microplex HyperSoft helical coil. Each of these coils was advanced into the aneurysm under fluoroscopic guidance using biplane roadmap technique. Prior to their detachment, a control arteriogram was performed through the guide catheter to ensure safe positioning of the coil mass.  Following the final coil there was near complete obliteration of the aneurysm.  The microcatheter was gently retrieved from the coil mass without any difficulty. A control arteriogram performed demonstrated near complete occlusion of the aneurysm with wide patency of the Lvis stent across the neck of the aneurysm.  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  Using a torque device, access through the nearly completely occluded left middle cerebral artery was achieved with the micro guidewire which was advanced to the distal M2 M3 region of the inferior division. The balloon was then advanced without difficulty and positioned such that the proximal and the distal markers were adequate distant and covered the segmental  high-grade stenosis.  A control angioplasty was then performed  using micro inflation syringe device via micro tubing. Slow increments were performed in the atmospheric pressures of the balloon to 4.9 atmospheres achieving approximately 1.95 mm diameter of the balloon. This was maintained for approximately 90 seconds. The balloon was then gently deflated and retrieved proximally whilst the wire was maintained distally in the M2 M3 region.  A control arteriogram performed through the 5 [place] Catalyst guide catheter demonstrated significantly improved caliber and flow through the angioplastied segment. The Gateway balloon was then advanced to the M2 M3 region over the micro guidewire. The micro guidewire was then retrieved. Free aspiration of blood was noted from the hub of the Gateway microcatheter.  This was in turn exchanged for a 014 inch 300 cm Softip Transend exchange micro guidewire using biplane roadmap technique and constant fluoroscopic guidance. The micro guidewire had a J shaped configuration to avoid dissections or inducing spasm.  A control arteriogram performed through the Catalyst guide catheter in the left internal carotid artery following the exchange micro guidewire placement demonstrated significantly improved caliber and flow through the angioplastied segment to approximately 60-70% patency.  It was elected to proceed with placement of a Wingspan stent across the angioplastied segment of the left middle cerebral artery.  A 3.5 mm x 20 mm Wingspan system was then prepped and purged with heparinized saline infusion in its housing.  The inner core was purged with heparinized saline infusion. Also the delivery microcatheter was purged with heparinized saline infusion and retrogradely via a Tuohy Borst and a 3 way stopcock to ensure absence of any air.  The system was then advanced as a unit over the exchange micro guidewire without difficulty.  The distal and the proximal markers of the stent were then advanced such that they were adequate distant at the  site of the angioplastied segment.  The Tuohy Borst at the hub of the microcatheter was then loosened. The inner core was then advanced with its butt at the proximal portion of the stent. Thereafter, whilst holding the inner core of the stent, the delivery microcatheter was retrieved unsheathing the distal and then the proximal portion of the stent. Once delivered, a control arteriogram performed through the 5 [place] Catalyst guide catheter demonstrated excellent apposition of the stent and coverage proximally and distally. Wide patency of the left middle cerebral artery angioplasty and stent segment was seen of almost 70-80%.  The delivery micro guidewire was then retrieved and removed.  Control arteriograms performed through the left internal carotid artery continued to demonstrate excellent flow through left MCA distribution without evidence of intraluminal filling defects or of occlusions involving the anterior or the middle cerebral artery distributions.  6 mg of intra-arterial Integrilin were given following the placement of the Wingspan stent system.  A control arteriogram performed through the 6 French Pinnacle sheath following removal of the Catalyst guide catheter now demonstrated the previously angioplastied segment of the left internal carotid artery proximally. The exchange micro guidewire had been maintained distally in the horizontal petrous segment.  Noted was modest recoil at the site of the previous angioplasty. It was, therefore, decided to proceed with placement of a stent across the angioplastied segment of the previously severely stenotic left internal carotid artery.  Over a 0.014 inch exchange micro guidewire, with the distal end in the petrous horizontal segment, measurements were performed of the proximal and the distal landing zones of the planned stent. After evaluating the measurements obtained, it was decided to  proceed with placement of a 9-7 mm x 40 mm Xact stent. This was  prepped and purged retrogradely with heparinized saline infusion. Using the rapid exchange technique, the stent delivery system was advanced without difficulty and positioned such that the distal and the proximal markers were adequate distant from the site of the previous angioplasty segment. This stent was then deployed without any difficulty in the usual manner. The delivery catheter system was then retrieved and removed whilst the exchange micro guidewire was maintained distally.  A control arteriogram performed through the 6 French Pinnacle sheath immediately and 10 and 20 minutes placement of the stent continued to demonstrate excellent flow through the stented segment with mild spasm at its distal end. Because of the risk of development of platelet aggregation within the stent, it was decided to give additional 6 mg of Integrilin intra-arterially.  A final control arteriogram performed approximately 25 minutes following placement of the left internal carotid artery stent continued to demonstrate excellent flow intracranially and extra cranially. 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 The left middle cerebral artery angioplasty in the stented segment now demonstrated close to 80% patency without gross evidence of intraluminal filling defects or of occlusions.  Throughout the procedure, the patient's blood pressure and neurological status remained stable. The  patient's ACT was maintained in the region of approximately 130 seconds throughout the procedure.  A flat panel CT of the brain performed with the patient on the table demonstrated no evidence of intracranial hemorrhage, mass effect or midline shift.  The patient was then returned to the OR for removal of the 6 French Pinnacle sheath by vascular surgery as per plan.  It was planned to a keep the patient intubated on ventilator for airway protection given the antiplatelets and  anticoagulation given for the entirety of the procedure.  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  PLAN: Patient to remain intubated for airway protection given the direct left carotid artery access and use of antiplatelets, and anticoagulation.   Electronically Signed   By: Luanne Bras M.D.   On: 03/28/2019 09:53   CT CEREBRAL PERFUSION W CONTRAST  Result Date: 03/26/2019 CLINICAL DATA:  Follow-up examination for acute stroke, recent ICA and MCA stenting and aneurysm coiling EXAM: CT ANGIOGRAPHY HEAD AND NECK CT PERFUSION BRAIN TECHNIQUE: Multidetector CT imaging of the head and neck was performed using the standard protocol during bolus administration of intravenous contrast. Multiplanar CT image reconstructions and MIPs were obtained to evaluate the vascular anatomy. Carotid stenosis measurements (when applicable) are obtained utilizing NASCET criteria, using the distal internal carotid diameter as the denominator. Multiphase CT imaging of the brain was performed following IV bolus contrast injection. Subsequent parametric perfusion maps were calculated using RAPID software. CONTRAST:  155m OMNIPAQUE IOHEXOL 350 MG/ML SOLN COMPARISON:  Comparison made with prior CTA from 03/19/2019. FINDINGS: CT HEAD FINDINGS Brain: Generalized age-related cerebral atrophy with chronic small vessel ischemic disease. No acute intracranial hemorrhage. No acute large vessel territory infarct. No mass lesion, midline shift or mass effect. No hydrocephalus. No extra-axial fluid collection. Vascular: Streak artifact from  interval coiling of left pericallosal aneurysm. Vascular stent has been placed within  the left M1 segment. No hyperdense vessel. Scattered vascular calcifications noted within the carotid siphons. Skull: Scalp soft tissues within normal limits.  Calvarium intact. Sinuses/Orbits: Globes and orbital soft tissues within normal limits. Scattered mucosal thickening noted within the ethmoidal air cells and right maxillary sinus. Few small air-fluid levels noted. Mastoid air cells are clear. Patient is intubated. Other: None. CTA NECK FINDINGS Aortic arch: Visualized aortic arch of normal caliber. Bovine arch with common origin of the right brachiocephalic and left common carotid artery noted. Extensive noncalcified plaque seen throughout the visualized arch and about the origin of the great vessels without hemodynamically significant stenosis. Irregular soft plaque and/or thrombus protruding into the lumen at the origin of the right brachiocephalic artery (series 12, image 38), unchanged. 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 Subclavian arteries otherwise irregular but patent without flow-limiting stenosis. Right carotid system: Right common carotid artery patent from its origin to the bifurcation without stenosis. Mild scattered plaque about the right bifurcation/proximal right ICA without hemodynamically significant stenosis. Right ICA irregular but patent to the skull base without stenosis, dissection, or occlusion. Left carotid system: Multifocal atheromatous irregularity within the left common carotid artery without stenosis. Interval placement of a vascular stent, proximal aspect at the origin of the left ICA. Mild-to-moderate stenoses involving the mid and distal aspect of the stent, measuring up to approximately 50% by NASCET criteria. Widely patent flow otherwise seen through the stent. No intraluminal thrombus or other complication. Left ICA irregular but otherwise widely  patent to the skull base without stenosis, dissection, or occlusion. Vertebral arteries: Both vertebral arteries arise from the subclavian arteries. Vertebral arteries remain widely patent within the neck without stenosis, dissection or occlusion. Skeleton: No acute osseous abnormality. No discrete osseous lesions. Moderate cervical spondylosis noted at C5-6. Patient is edentulous. Other neck: Endotracheal and enteric tubes in place. Postoperative changes from recent cutdown and open exposure of the left common carotid artery seen at the lower anterior left neck. Percutaneous drain remains in place within this region with associated scattered foci of soft tissue emphysema. Associated postoperative swelling and blood products present within this region as well. Small linear contrast blush within this region adjacent to the surgical drain likely reflects a small amount of persistent bleeding, likely venous in nature (series 5, image 36). No other active contrast extravasation. Upper chest: Layering bilateral pleural effusions with associated atelectasis partially visualized. Paraseptal emphysematous changes noted at the lung apices. Median sternotomy partially visualized. Review of the MIP images confirms the above findings CTA HEAD FINDINGS Anterior circulation: Petrous segments are widely patent. Scattered atherosclerotic change throughout the carotid siphons with associated moderate multifocal narrowing, unchanged. Left A1 widely patent. Hypoplastic right A1. Normal anterior communicating artery. Partially azygos ACA noted. 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 No visible neck remnant identified. Grossly patent flow through the adjacent stent. Interval placement of a vascular stent across the previously seen severe left M1 stenosis. Patent flow is seen through the stent. Left MCA branches perfused distally. Mild stenosis involving the proximal right M1 segment noted, stable. Distal  right MCA branches well perfused and stable from previous. Posterior circulation: Vertebral arteries remain widely patent to the vertebrobasilar junction. Posterior inferior cerebral arteries patent bilaterally. Basilar diffusely diminutive with associated mild multifocal narrowing. Superior cerebral arteries patent bilaterally. PCA supplied via hypoplastic P1 segments as well as robust bilateral posterior communicating arteries. Prominent atherosclerotic change throughout both PCAs with associated moderate to severe multifocal stenoses, right worse than  left, grossly stable. Venous sinuses: Grossly patent allowing for timing of the contrast bolus Anatomic variants: Predominant fetal type origin of the PCAs. Hypoplastic right A1 segment. Review of the MIP images confirms the above findings CT Brain Perfusion Findings: CBF (<30%) Volume: 54m Perfusion (Tmax>6.0s) volume: 015mMismatch Volume: 76m376mnfarction Location:Negative CT perfusion for acute ischemia. On source perfusion maps, there is increased cerebral blood volume and cerebral blood flow with mildly decreased mean transit time and T-max, likely reflecting improved cerebrovascular flow to the left cerebral hemisphere due to interval stenting of the left ICA and MCA. No other perfusion abnormality. IMPRESSION: CT HEAD IMPRESSION: 1. No acute intracranial abnormality. 2. 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 3. 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 CTA HEAD AND NECK IMPRESSION: 1. Negative CTA for emergent large vessel occlusion. 2. Interval stenting at the proximal left ICA and left M1 segment without complication. Patent flow seen through both stents. 3. 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 No residual neck remnant identified. 4. Postoperative changes from interval cutdown for left carotid artery exposure/access. Surgical drain remains in  place. Small blush of contrast adjacent to the drain consistent with a small focus of active bleeding, likely venous in nature. 5. Otherwise stable CTA with extensive atherosclerotic change elsewhere throughout the major arterial vasculature of the head and neck. CT PERFUSION IMPRESSION: 1. Negative CT perfusion for acute core infarct. 2. Increased cerebral blood flow and blood volume with decreased T-max within the left cerebral hemisphere, reflecting improved vascular flow to the left MCA distribution due to the left ICA and MCA stents. 3. Otherwise negative CT perfusion, with no other perfusion abnormality. These results were communicated to Dr. AroLorraine Lax 9:30 pmon 1/27/2021by text page via the [facility] system. Electronically Signed   By: BenJeannine BogaD.   On: 03/26/2019 22:49   DG CHEST PORT 1 VIEW  Result Date: 03/28/2019 CLINICAL DATA:  Hypertension, stroke EXAM: PORTABLE CHEST 1 VIEW COMPARISON:  Radiograph 03/26/2019 FINDINGS: Interval removal of the endotracheal and transesophageal tubes. Right upper extremity PICC tip terminates at the superior cavoatrial junction. Telemetry leads overlie the chest. Median sternotomy wires remain intact and aligned. Extensive CABG clips project over the cardiac silhouette. Slightly improved atelectasis when compared to prior. No acute osseous or soft tissue abnormality. IMPRESSION: Improving volumes. Stable cardiomegaly. Removal of the endotracheal and transesophageal tubes. Satisfactory positioning of the right upper extremity PICC. Electronically Signed   By: PriLovena LeD.   On: 03/28/2019 05:52   DG Chest Port 1 View  Result Date: 03/26/2019 CLINICAL DATA:  Endotracheal tube placement. Hypertension. Coronary artery disease. EXAM: PORTABLE CHEST 1 VIEW COMPARISON:  03/17/2019 from [facility] median sternotomy. Endotracheal tube terminates 5.5 cm above carina. Nasogastric terminates at the body of the stomach with the  side port likely just at the gastroesophageal junction. Numerous leads and wires project over the chest. Cardiomegaly accentuated by AP portable technique. The apices are partially excluded. No pleural fluid. Low lung volumes with resultant pulmonary interstitial prominence. New subsegmental atelectasis at the left lung base. IMPRESSION: Nasogastric tube borderline low in position.  Consider advancement. Otherwise, appropriate position of support apparatus. Diminished lung volumes with new left lower lobe subsegmental atelectasis. Cardiomegaly without congestive failure. Electronically Signed   By: KylAbigail MiyamotoD.   On: 03/26/2019 16:16   DG Abd 2 Views  Result Date: 03/23/2019 CLINICAL DATA:  Patient status post stroke.  Abdominal distension. EXAM: ABDOMEN - 2 VIEW COMPARISON:  None. FINDINGS: The bowel gas pattern is normal. There is no evidence of free air. No radio-opaque calculi or other significant radiographic abnormality is seen. Contrast in a distended urinary bladder from the patient's angiogram is noted. IMPRESSION: Normal bowel gas pattern. Distended urinary bladder. Electronically Signed   By: Inge Rise M.D.   On: 03/23/2019 11:24   DG C-Arm 1-60 Min-No Report  Result Date: 03/26/2019 Fluoroscopy was utilized by the requesting physician.  No radiographic interpretation.   VAS [place] ABI WITH/WO TBI  Result Date: 03/22/2019 LOWER EXTREMITY DOPPLER STUDY Indications: Peripheral artery disease, and cold right foot post op angio. High Risk Factors: Hypertension, hyperlipidemia, coronary artery disease.  Comparison Study: no prior Performing Technologist: Abram Sander RVS  Examination Guidelines: A complete evaluation includes at minimum, Doppler waveform signals and systolic blood pressure reading at the level of bilateral brachial, anterior tibial, and posterior tibial arteries, when vessel segments are accessible. Bilateral testing is considered an integral part of a complete examination.  Photoelectric Plethysmograph (PPG) waveforms and toe systolic pressure readings are included as required and additional duplex testing as needed. Limited examinations for reoccurring indications may be performed as noted.  ABI Findings: +--------+------------------+-----+---------+-----------+ Right   Rt Pressure (mmHg)IndexWaveform Comment     +--------+------------------+-----+---------+-----------+ [id number]                    triphasic            +--------+------------------+-----+---------+-----------+ PTA                                     not audible +--------+------------------+-----+---------+-----------+ PERO                                                +--------+------------------+-----+---------+-----------+ DP                                      not audible +--------+------------------+-----+---------+-----------+ +--------+------------------+-----+---------+-----------+ Left    Lt Pressure (mmHg)IndexWaveform Comment     +--------+------------------+-----+---------+-----------+ [id number]                    triphasic            +--------+------------------+-----+---------+-----------+ PTA                                     not audible +--------+------------------+-----+---------+-----------+ DP                                      not audible +--------+------------------+-----+---------+-----------+  Summary: Right: Unable to obtain DP and PTA. Not audbile. Right arterial duplex showed occluded SFA and popliteal artery. Left: Unable to obtain DP and PTA. Not audbile. Monophasic doppler flow noted in AT PT and peroneal right leg on duplex  *See table(s) above for measurements and observations.  Electronically signed by Ruta Hinds MD on 03/22/2019 at 9:39:19 AM.   Final    ECHOCARDIOGRAM COMPLETE  Result Date: 03/27/2019   ECHOCARDIOGRAM REPORT   Patient Name:  [place] Charon Prewitt Date of Exam: 03/27/2019 Medical Rec #:  [id number]                  Height:       77.0 in Accession #:    [id number]                Weight:       233.9 lb Date of Birth:  [birth year]                BSA:          2.39 m Patient Age:    69 years                  BP:           113/101 mmHg Patient Gender: M                         HR:           72 bpm. Exam Location:  Inpatient Procedure: 2D Echo Indications:    Acute Respiratory Insufficiency 518.82 / R06.89  History:        Patient has no prior history of Echocardiogram examinations.                 CAD; Stroke.  Sonographer:    Vikki Ports Turrentine Referring Phys: [place]  1. Left ventricular ejection fraction, by visual estimation, is 50 to 55%. The left ventricle has low normal function. Left ventricular septal wall thickness was mildly increased. Mildly increased left ventricular posterior wall thickness. There is no left ventricular hypertrophy.  2. Left ventricular diastolic parameters are consistent with Grade I diastolic dysfunction (impaired relaxation).  3. Global right ventricle has normal systolic function.The right ventricular size is normal. No increase in right ventricular wall thickness.  4. Left atrial size was severely dilated.  5. Right atrial size was severely dilated.  6. The mitral valve is normal in structure. Trivial mitral valve regurgitation. No evidence of mitral stenosis.  7. The tricuspid valve is normal in structure.  8. The tricuspid valve is normal in structure. Tricuspid valve regurgitation is trivial.  9. The aortic valve is tricuspid. Aortic valve regurgitation is not visualized. No evidence of aortic valve sclerosis or stenosis. 10. The pulmonic valve was normal in structure. Pulmonic valve regurgitation is trivial. 11. Normal pulmonary artery systolic pressure. 12. The inferior vena cava is normal in size with greater than 50% respiratory variability, suggesting right atrial pressure of 3 mmHg. FINDINGS  Left Ventricle: Left ventricular ejection fraction, by  visual estimation, is 50 to 55%. The left ventricle has low normal function. The left ventricle is not well visualized. The left ventricular internal cavity size was the left ventricle is normal in size. Mildly increased left ventricular posterior wall thickness. There is no left ventricular hypertrophy. Left ventricular diastolic parameters are consistent with Grade I diastolic dysfunction (impaired relaxation). Normal left atrial pressure. Right Ventricle: The right ventricular size is normal. No increase in right ventricular wall thickness. Global RV systolic function is has normal systolic function. The tricuspid regurgitant velocity is 1.91 m/s, and with an assumed right atrial pressure  of 3 mmHg, the estimated right ventricular systolic pressure is normal at 17.6 mmHg. Left Atrium: Left atrial size was severely dilated. Right Atrium: Right atrial size was severely dilated Pericardium: There is no evidence of pericardial effusion. Mitral Valve: The mitral valve is normal in structure. Trivial mitral  valve regurgitation. No evidence of mitral valve stenosis by observation. Tricuspid Valve: The tricuspid valve is normal in structure. Tricuspid valve regurgitation is trivial. Aortic Valve: The aortic valve is tricuspid. Aortic valve regurgitation is not visualized. The aortic valve is structurally normal, with no evidence of sclerosis or stenosis. Aortic valve mean gradient measures 4.0 mmHg. Aortic valve peak gradient measures 9.1 mmHg. Aortic valve area, by VTI measures 4.61 cm. Pulmonic Valve: The pulmonic valve was normal in structure. Pulmonic valve regurgitation is trivial. Pulmonic regurgitation is trivial. Aorta: The aortic root, ascending aorta and aortic arch are all structurally normal, with no evidence of dilitation or obstruction. Venous: The inferior vena cava is normal in size with greater than 50% respiratory variability, suggesting right atrial pressure of 3 mmHg. IAS/Shunts: No atrial level  shunt detected by color flow Doppler. There is no evidence of a patent foramen ovale. No ventricular septal defect is seen or detected. There is no evidence of an atrial septal defect.  LEFT VENTRICLE PLAX 2D LVIDd:         4.95 cm       Diastology LVIDs:         3.75 cm       LV e' lateral:   6.74 cm/s LV PW:         1.20 cm       LV E/e' lateral: 12.4 LV IVS:        1.20 cm       LV e' medial:    8.81 cm/s LVOT diam:     2.70 cm       LV E/e' medial:  9.5 LV SV:         55 ml LV SV Index:   22.99 LVOT Area:     5.73 cm  LV Volumes (MOD) LV area d, A2C:    31.40 cm LV area d, A4C:    35.10 cm LV area s, A2C:    19.60 cm LV area s, A4C:    21.50 cm LV major d, A2C:   8.01 cm LV major d, A4C:   8.17 cm LV major s, A2C:   7.17 cm LV major s, A4C:   6.43 cm LV vol d, MOD A2C: 104.0 ml LV vol d, MOD A4C: 131.0 ml LV vol s, MOD A2C: 47.5 ml LV vol s, MOD A4C: 62.1 ml LV SV MOD A2C:     56.5 ml LV SV MOD A4C:     131.0 ml LV SV MOD BP:      60.5 ml RIGHT VENTRICLE RV S prime:     15.40 cm/s TAPSE (M-mode): 2.1 cm LEFT ATRIUM              Index       RIGHT ATRIUM           Index LA diam:        4.05 cm  1.69 cm/m  RA Area:     33.00 cm LA Vol (A2C):   92.8 ml  38.82 ml/m RA Volume:   115.00 ml 48.10 ml/m LA Vol (A4C):   109.0 ml 45.59 ml/m LA Biplane Vol: 101.0 ml 42.25 ml/m  AORTIC VALVE AV Area (Vmax):    4.36 cm AV Area (Vmean):   4.36 cm AV Area (VTI):     4.61 cm AV Vmax:           151.00 cm/s AV Vmean:          97.300 cm/s  AV VTI:            0.231 m AV Peak Grad:      9.1 mmHg AV Mean Grad:      4.0 mmHg LVOT Vmax:         115.00 cm/s LVOT Vmean:        74.100 cm/s LVOT VTI:          0.186 m LVOT/AV VTI ratio: 0.81  AORTA Ao Root diam: 3.80 cm MITRAL VALVE                        TRICUSPID VALVE MV Area (PHT): 3.74 cm             TR Peak grad:   14.6 mmHg MV PHT:        58.87 msec           TR Vmax:        191.00 cm/s MV Decel Time: 203 msec MV E velocity: 83.40 cm/s 103 cm/s  SHUNTS MV A velocity:  92.20 cm/s 70.3 cm/s Systemic VTI:  0.19 m MV E/A ratio:  0.90       1.5       Systemic Diam: 2.70 cm  Skeet Latch MD Electronically signed by Skeet Latch MD Signature Date/Time: 03/27/2019/4:31:15 PM    Final    VAS US CAROTID  Result Date: 03/27/2019 Carotid Arterial Duplex Study Indications:       CVA, Syncope, Left stent and possible hematoma. Risk Factors:      Hypertension, current smoker, coronary artery disease. Limitations        Today's exam was limited due to the post surgical status of                    the patient, patient on a ventilator and movement. Comparison Study:  No prior study on file for comparison. Performing Technologist: Sharion Dove RVS  Examination Guidelines: A complete evaluation includes B-mode imaging, spectral Doppler, color Doppler, and power Doppler as needed of all accessible portions of each vessel. Bilateral testing is considered an integral part of a complete examination. Limited examinations for reoccurring indications may be performed as noted.  Left Carotid Findings: +----------+--------+--------+--------+------------------+------------------+           PSV cm/sEDV cm/sStenosisPlaque DescriptionComments           +----------+--------+--------+--------+------------------+------------------+ CCA Prox  94      19                                intimal thickening +----------+--------+--------+--------+------------------+------------------+ CCA Distal81      15                                intimal thickening +----------+--------+--------+--------+------------------+------------------+ ICA Prox                                            stent              +----------+--------+--------+--------+------------------+------------------+ ICA Distal52      22                                                   +----------+--------+--------+--------+------------------+------------------+  ECA       55      12                                                    +----------+--------+--------+--------+------------------+------------------+ +----------+--------+--------+--------------+-------------------+           PSV cm/sEDV cm/sDescribe      Arm Pressure (mmHG) +----------+--------+--------+--------------+-------------------+ Subclavian                Not identified                    +----------+--------+--------+--------------+-------------------+ +---------+--------+--------+--------------+ VertebralPSV cm/sEDV cm/sNot identified +---------+--------+--------+--------------+  Left Stent(s): +--------------+--+--++++ Prox to Stent 6211 +--------------+--+--++++ Proximal Stent8032 +--------------+--+--++++ Mid Stent     7227 +--------------+--+--++++    Summary:  Left Carotid: Patent stent. Small area of mixed echoes noted at incision site,               no pseudoaneurysm or large hematoma noted. 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 *See table(s) above for measurements and observations.  Electronically signed by Antony Contras MD on 03/27/2019 at 12:48:21 PM.    Final    VAS [place] LOWER EXTREMITY ARTERIAL DUPLEX  Result Date: 03/22/2019 LOWER EXTREMITY ARTERIAL DUPLEX STUDY Indications: Peripheral artery disease.  Current ABI: n/a Limitations: right foot cool post angio Comparison Study: no prior Performing Technologist: Abram Sander RVS  Examination Guidelines: A complete evaluation includes B-mode imaging, spectral Doppler, color Doppler, and power Doppler as needed of all accessible portions of each vessel. Bilateral testing is considered an integral part of a complete examination. Limited examinations for reoccurring indications may be performed as noted.  +-----------+--------+-----+--------+----------+--------+ RIGHT      PSV cm/sRatioStenosisWaveform  Comments +-----------+--------+-----+--------+----------+--------+ CFA  Prox   108                  biphasic           +-----------+--------+-----+--------+----------+--------+ DFA        47                   monophasic         +-----------+--------+-----+--------+----------+--------+ SFA Prox                occluded                   +-----------+--------+-----+--------+----------+--------+ SFA Mid                 occluded                   +-----------+--------+-----+--------+----------+--------+ SFA Distal              occluded                   +-----------+--------+-----+--------+----------+--------+ POP Prox                occluded                   +-----------+--------+-----+--------+----------+--------+ ATA Distal 9                    monophasic         +-----------+--------+-----+--------+----------+--------+ PTA Distal 16  monophasic         +-----------+--------+-----+--------+----------+--------+ PERO Distal10                   monophasic         +-----------+--------+-----+--------+----------+--------+   Summary: Right: Total occlusion noted in the superficial femoral artery. Total occlusion noted in the superficial femoral artery and/or popliteal artery. Total occlusion noted in the popliteal artery.  See table(s) above for measurements and observations. Electronically signed by Ruta Hinds MD on 03/22/2019 at 9:38:33 AM.    Final    [place] EKG SITE RITE  Result Date: 03/27/2019 If Site Rite image not attached, placement could not be confirmed due to current cardiac rhythm.  IR ANGIO INTRA EXTRACRAN SEL COM CAROTID INNOMINATE BILAT MOD SED  Result Date: 03/25/2019 CLINICAL DATA:  Recent history of aphasia and transient right-sided weakness. Workup revealed severe high-grade stenosis of the proximal left middle cerebral artery M1 segment on CT angiogram of the head neck. 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 EXAM: BILATERAL COMMON CAROTID AND INNOMINATE  ANGIOGRAPHY COMPARISON:  CT angiogram of the head and neck of March 19, 2019. MEDICATIONS: Heparin 1000 units IV; no antibiotic was administered within 1 hour of the procedure. ANESTHESIA/SEDATION: Versed 1 mg IV; Fentanyl 25 mcg IV Moderate Sedation Time:  41 minutes The patient was continuously monitored during the procedure by the interventional radiology nurse under my direct supervision. CONTRAST:  Isovue 300 approximately 60 mL. FLUOROSCOPY TIME:  Fluoroscopy Time: 10 minutes 12 seconds (1256 mGy). COMPLICATIONS: None immediate. TECHNIQUE: Informed written consent was obtained from the patient after a thorough discussion of the procedural risks, benefits and alternatives. All questions were addressed. Maximal Sterile Barrier Technique was utilized including caps, mask, sterile gowns, sterile gloves, sterile drape, hand hygiene and skin antiseptic. A timeout was performed prior to the initiation of the procedure. The right radial artery was interrogated and imaged for a right radial approach with ultrasound. The artery was noted to be severely narrowed distally. Therefore, this approach was not used. The right groin was prepped and draped in the usual sterile fashion. Thereafter using modified Seldinger technique, transfemoral access into the right common femoral artery was obtained without difficulty. Over a 0.035 inch guidewire, a 5 French Pinnacle sheath was inserted. Through this, and also over 0.035 inch guidewire, a 5 [place] JB 1 catheter was advanced to the aortic arch region and selectively positioned in the right common carotid artery, right subclavian artery, the left common carotid artery and the left subclavian artery. FINDINGS: The innominate artery injection demonstrates the origins of the right subclavian artery and the right common carotid artery to be widely patent. The right common carotid arteriogram demonstrates the right external carotid artery and its major branches to be widely patent.  The right internal carotid artery just distal to the bulb has a mild approximately 20% stenosis by the NASCET criteria. No evidence of intraluminal filling defects or of ulcerations is seen. More distally, the vessel is seen to opacify to the cranial skull base. The petrous segment is widely patent. There is diffuse narrowing of the cavernous segment with approximately 50% narrowing of the right internal carotid supraclinoid segment. The right middle cerebral artery proximally demonstrates mild arteriosclerotic narrowing. The superior division demonstrates wide patency into the capillary and venous phases. The inferior division demonstrates a segmental area of moderate stenosis. Hypoplastic right anterior cerebral artery is seen with angiographic occlusion in the mid A2 segment. The origin of the right vertebral artery  is widely patent. The vessel is seen to opacify to the cranial skull base with mild areas of focal narrowing. The right vertebrobasilar junction is widely patent. The right posteroinferior cerebellar artery demonstrates a high-grade stenosis in its proximal 1/3. More distally, the opacified portion of the basilar artery, the superior cerebellar arteries and transiently the posterior cerebral arteries and the anterior-inferior cerebellar arteries demonstrate patency into the capillary and venous phases. Unopacified blood is seen in hypoplastic basilar artery from the contralateral vertebral artery. The left common carotid arteriogram demonstrates the left external carotid artery and its major branches to be widely patent. The left internal carotid artery just distal to the bulb has a smooth area of atherosclerotic disease associated with narrowing of approximately 75%. No evidence of intraluminal filling defects is seen. More distally, the left internal carotid artery is seen to opacify to the cranial skull base. Wide patency is seen of the petrous segment. Tapered narrowing of the cavernous segment to  the level of the takeoff of the ophthalmic artery. Mild focal irregularity is also seen of the supraclinoid left ICA. A left posterior communicating artery is seen opacifying the left posterior cerebral artery distribution with focal areas of caliber irregularity. The left middle cerebral artery M1 segment demonstrates severe 90% stenosis. Patency of the left MCA trifurcation branches is seen into the capillary and the venous phases. The left anterior cerebral artery opacifies into the capillary and venous phases. Arising from the distal pericallosal artery is a large saccular aneurysm with mild lobulation. This is measured at approximately 9.7 mm x 7.0 mm. The left subclavian arteriogram demonstrates atherosclerotic disease involving the proximal left subclavian artery with a mild tapered stenosis of the artery proximal to the origin of the left vertebral artery. The left vertebral artery is seen to opacify to the cranial skull base. Patency is seen of the left vertebrobasilar junction and the basilar artery. The opacified portion of the left posteroinferior cerebellar artery demonstrates wide patency. Also the opacified portions of the basilar artery, the posterior cerebral arteries and the superior cerebellar arteries is grossly intact. Mixing of unopacified blood is seen from contralateral right vertebral artery. 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 Approximately 75% stenosis of the proximal left internal carotid artery just distal to the bulb. Probable angiographic occlusion of the right anterior cerebral artery, in the mid A2 segment. PLAN: Above findings were discussed with the patient. Prior to consideration of treatment of the left middle cerebral artery M1 segment symptomatic stenosis, and of the large aneurysm in the left pericallosal region, further  workup regarding the lower extremity peripheral vascular disease may be necessary given the severely depleted pulses in both feet left greater than right. Electronically Signed   By: Luanne Bras M.D.   On: 03/21/2019 15:11   IR ANGIO INTRA EXTRACRAN SEL INTERNAL CAROTID UNI L MOD SED  Result Date: 03/26/2019 CLINICAL DATA:  Symptomatic high-grade stenosis of the left middle cerebral artery proximal M1 segment with history of recent ischemic stroke.  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  EXAM: TRANSCATHETER THERAPY EMBOLIZATION  COMPARISON:  Diagnostic catheter arteriogram of March 21, 2019.  MEDICATIONS: Heparin 1000 units IV. Ancef 4 g IV antibiotic was administered within  1 hour of the procedure.  ANESTHESIA/SEDATION: General anesthesia.  CONTRAST:  Isovue 300 approximately 150 mL.  FLUOROSCOPY TIME:  Fluoroscopy Time: 101 minutes 0 seconds (4155 mGy).  COMPLICATIONS: None immediate.  TECHNIQUE: Informed written consent was obtained from the patient after a thorough discussion of the procedural risks, benefits and alternatives. All questions were addressed. Maximal Sterile Barrier Technique was utilized including caps, mask, sterile gowns, sterile gloves, sterile drape, hand hygiene and skin antiseptic. A timeout was performed prior to the initiation of the procedure.  Direct access to the left common carotid artery was provided by vascular surgery in the OR due to patient's severe peripheral vascular disease, and absent flow in the right radial artery.  A 6 French Pinnacle sheath was inserted and sutured in the left common carotid artery in the mid section. The patient was brought to the angio suite with the 6 French Pinnacle sheath.  This was then connected to continuous heparinized  saline infusion following flushing. An arteriogram was then obtained of the left common carotid bifurcation and also intracranially.  FINDINGS: The left common carotid bifurcation again demonstrates the left external carotid artery and its major branches to be widely patent.  The left internal carotid artery just distal to the bulb again demonstrates a significant stenosis just distal to the carotid bulb of approximately 70-75%.  More distally, the left internal carotid artery is seen to opacify to the cranial skull base.  The petrous segment is widely patent.  There is a mild to moderate stenosis of the distal cavernous segment of the left internal carotid artery and to a lesser degree of the supraclinoid left internal carotid artery. A left posterior communicating artery is seen opacifying the left posterior cerebral artery distribution.  The left middle cerebral artery again demonstrates a severe M1 segment stenosis with opacification of the left MCA trifurcation branches into the capillary and venous phases.  The left anterior cerebral artery demonstrates mild arteriosclerotic changes in the proximal A1 and A2 segments.  More distally, mild arteriosclerotic changes are also noted in the A3 segment of the left anterior cerebral artery pericallosal branch.  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  The subsequent capillary and venous phases are unremarkable.  ENDOVASCULAR TREATMENT OF THE LEFT ANTERIOR CEREBRAL ARTERY PERICALLOSAL REGION ANEURYSM WITH STENT ASSISTED COILING  Initial treatment angioplasty of the left internal carotid approximately was performed following advancement of a 5 mm x 30 mm Viatrac 14 angioplasty microcatheter. This had been prepped and purged with heparinized saline infusion. Over a 014 inch standard Synchro micro guidewire with a J configuration, the combination of the Viatrac 14 angioplasty  balloon catheter in the micro guidewire advanced to the proximal left internal carotid artery.  Using a torque device, the micro guidewire was advanced without difficulty to the distal cervical segment of the left internal carotid artery.  This was then followed by the advancement of the Viatrac 5 x 30 mm balloon in place adequate distance at the site of severe stenosis.  Slow control angioplasty was then performed using micro inflation syringe device via micro tubing. Balloon was inflated to 8.3 atmospheres where it was maintained for approximately 1 minute. The balloon was then deflated and advanced more distally. A control arteriogram performed through 5 French Pinnacle sheath in the left common carotid artery again demonstrated significantly improved caliber and flow through the angioplastied segment.  The combination of the balloon and  the microcatheter were advanced to the horizontal petrous segment of the left internal carotid artery.  The micro guidewire was removed, and the Viatrac balloon was then exchanged for an 014 inch 300 cm Softip Transend exchange guidewire. Over the exchange micro guidewire, a 5 French 115 cm Catalyst guide catheter was advanced and positioned in the horizontal petrous segment of the left internal carotid artery.  The exchange micro guidewire was then removed. Good aspiration was obtained from the hub of the 5 [place] Catalyst guide catheter in the left internal carotid artery. A control arteriogram performed through the 5 [place] Catalyst guide catheter in the left internal carotid artery demonstrated no changes in the intracranial circulation.  Over a 0.014 inch standard Synchro micro guidewire with a J configuration, a Headway 17 2 tip microcatheter was advanced without difficulty to the supraclinoid left ICA.  The micro guidewire was then gently manipulated with a torque device and advanced through left anterior cerebral artery A1 A2 segments followed by the microcatheter  to the A3 segment of the anterior cerebral artery pericallosal branch. The wire was then advanced without difficulty distal to the neck of the aneurysm followed by the microcatheter. The guidewire was removed. Good aspiration obtained from the hub of microcatheter. A gentle control arteriogram performed through the microcatheter demonstrates safe position of tip of the microcatheter. This was then connected to continuous heparinized saline infusion. At this time after having obtained measurements of the pericallosal artery distal and proximal to the aneurysm, a 2.5 mm x 23 mm Lvis junior stent was advanced to the distal end of the microcatheter.  Under constant fluoroscopic guidance using roadmap technique, the delivery microcatheter was retrieved unsheathing the distal and then the proximal Lvis stent. A control arteriogram performed through the 5 [place] Catalyst guide catheter now advanced to the A1 segment of the left anterior cerebral artery demonstrates safe positioning and excellent apposition of the Lvis stent. At this time, the 014 inch standard Synchro micro guidewire was then advanced through the microcatheter into the aneurysm without difficulty followed by the microcatheter. The guidewire was removed. Good aspiration was obtained with the intra aneurysmal microcatheter. This was then connected to continuous heparinized saline infusion.  The first coil utilized for aneurysm embolization was a Microplex 10 5 mm x 15 mm HyperSoft 3D coil. This was advanced using biplane roadmap technique and constant fluoroscopic guidance in a coaxial manner and with constant heparinized saline infusion to the distal end of the intra-aneurysmal microcatheter. This coil was then advanced under constant fluoroscopic guidance. Prior to its detachment, a control arteriogram performed through the 5 [place] Catalyst guide catheter in the left anterior cerebral artery demonstrates safe positioning of the coil. This was then  detached without difficulty. This was subsequently followed by advancement of a 4 mm x 8 mm HyperSoft 3D coil, a 2.5 mm x 8 cm HyperSoft 3D coil, a 3 mm x 6 cm HyperSoft 3D coil, a 3 mm x 4 cm Microplex HyperSoft 3D coil and finally a 2 mm x 6 cm Microplex HyperSoft helical coil. Each of these coils was advanced into the aneurysm under fluoroscopic guidance using biplane roadmap technique. Prior to their detachment, a control arteriogram was performed through the guide catheter to ensure safe positioning of the coil mass.  Following the final coil there was near complete obliteration of the aneurysm.  The microcatheter was gently retrieved from the coil mass without any difficulty. A control arteriogram performed demonstrated near complete occlusion of the aneurysm with wide  patency of the Lvis stent across the neck of the aneurysm.  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  Using a torque device, access through the nearly completely occluded left middle cerebral artery was achieved with the micro guidewire which was advanced to the distal M2 M3 region of the inferior division. The balloon was then advanced without difficulty and positioned such that the proximal and the distal markers were adequate distant and covered the segmental high-grade stenosis.  A control angioplasty was then performed using micro inflation syringe device via micro tubing. Slow increments were performed in the atmospheric pressures of the balloon to 4.9 atmospheres achieving approximately 1.95 mm diameter of the balloon. This was  maintained for approximately 90 seconds. The balloon was then gently deflated and retrieved proximally whilst the wire was maintained distally in the M2 M3 region.  A control arteriogram performed through the 5 [place] Catalyst guide catheter demonstrated significantly improved caliber and flow through the angioplastied segment. The Gateway balloon was then advanced to the M2 M3 region over the micro guidewire. The micro guidewire was then retrieved. Free aspiration of blood was noted from the hub of the Gateway microcatheter.  This was in turn exchanged for a 014 inch 300 cm Softip Transend exchange micro guidewire using biplane roadmap technique and constant fluoroscopic guidance. The micro guidewire had a J shaped configuration to avoid dissections or inducing spasm.  A control arteriogram performed through the Catalyst guide catheter in the left internal carotid artery following the exchange micro guidewire placement demonstrated significantly improved caliber and flow through the angioplastied segment to approximately 60-70% patency.  It was elected to proceed with placement of a Wingspan stent across the angioplastied segment of the left middle cerebral artery.  A 3.5 mm x 20 mm Wingspan system was then prepped and purged with heparinized saline infusion in its housing.  The inner core was purged with heparinized saline infusion. Also the delivery microcatheter was purged with heparinized saline infusion and retrogradely via a Tuohy Borst and a 3 way stopcock to ensure absence of any air.  The system was then advanced as a unit over the exchange micro guidewire without difficulty.  The distal and the proximal markers of the stent were then advanced such that they were adequate distant at the site of the angioplastied segment.  The Tuohy Borst at the hub of the microcatheter was then loosened. The inner core was then advanced with its butt at the proximal portion of the stent. Thereafter, whilst holding  the inner core of the stent, the delivery microcatheter was retrieved unsheathing the distal and then the proximal portion of the stent. Once delivered, a control arteriogram performed through the 5 [place] Catalyst guide catheter demonstrated excellent apposition of the stent and coverage proximally and distally. Wide patency of the left middle cerebral artery angioplasty and stent segment was seen of almost 70-80%.  The delivery micro guidewire was then retrieved and removed.  Control arteriograms performed through the left internal carotid artery continued to demonstrate excellent flow through left MCA distribution without evidence of intraluminal filling defects or of occlusions involving the anterior or the  middle cerebral artery distributions.  6 mg of intra-arterial Integrilin were given following the placement of the Wingspan stent system.  A control arteriogram performed through the 6 French Pinnacle sheath following removal of the Catalyst guide catheter now demonstrated the previously angioplastied segment of the left internal carotid artery proximally. The exchange micro guidewire had been maintained distally in the horizontal petrous segment.  Noted was modest recoil at the site of the previous angioplasty. It was, therefore, decided to proceed with placement of a stent across the angioplastied segment of the previously severely stenotic left internal carotid artery.  Over a 0.014 inch exchange micro guidewire, with the distal end in the petrous horizontal segment, measurements were performed of the proximal and the distal landing zones of the planned stent. After evaluating the measurements obtained, it was decided to proceed with placement of a 9-7 mm x 40 mm Xact stent. This was prepped and purged retrogradely with heparinized saline infusion. Using the rapid exchange technique, the stent delivery system was advanced without difficulty and positioned such that the distal and the proximal markers  were adequate distant from the site of the previous angioplasty segment. This stent was then deployed without any difficulty in the usual manner. The delivery catheter system was then retrieved and removed whilst the exchange micro guidewire was maintained distally.  A control arteriogram performed through the 6 French Pinnacle sheath immediately and 10 and 20 minutes placement of the stent continued to demonstrate excellent flow through the stented segment with mild spasm at its distal end. Because of the risk of development of platelet aggregation within the stent, it was decided to give additional 6 mg of Integrilin intra-arterially.  A final control arteriogram performed approximately 25 minutes following placement of the left internal carotid artery stent continued to demonstrate excellent flow intracranially and extra cranially. 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 The left middle cerebral artery angioplasty in the stented segment now demonstrated close to 80% patency without gross evidence of intraluminal filling defects or of occlusions.  Throughout the procedure, the patient's blood pressure and neurological status remained stable. The  patient's ACT was maintained in the region of approximately 130 seconds throughout the procedure.  A flat panel CT of the brain performed with the patient on the table demonstrated no evidence of intracranial hemorrhage, mass effect or midline shift.  The patient was then returned to the OR for removal of the 6 French Pinnacle sheath by vascular surgery as per plan.  It was planned to a keep the patient intubated on ventilator for airway protection given the antiplatelets and anticoagulation given for the entirety of the procedure.  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  PLAN: Patient to remain intubated for airway protection given the direct left carotid artery access and use of antiplatelets, and anticoagulation.   Electronically Signed   By: Luanne Bras M.D.   On: 03/28/2019 09:53   IR ANGIO VERTEBRAL SEL SUBCLAVIAN INNOMINATE BILAT MOD SED  Result Date: 03/21/2019 CLINICAL DATA:  Recent history of aphasia and transient right-sided weakness. Workup revealed severe high-grade stenosis of the proximal left middle cerebral artery M1 segment on CT angiogram  of the head neck. 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  EXAM: BILATERAL COMMON CAROTID AND INNOMINATE ANGIOGRAPHY  COMPARISON:  CT angiogram of the head and neck of March 19, 2019.  MEDICATIONS: Heparin 1000 units IV; no antibiotic was administered within 1 hour of the procedure.  ANESTHESIA/SEDATION: Versed 1 mg IV; Fentanyl 25 mcg IV  Moderate Sedation Time:  41 minutes  The patient was continuously monitored during the procedure by the interventional radiology nurse under my direct supervision.  CONTRAST:  Isovue 300 approximately 60 mL.  FLUOROSCOPY TIME:  Fluoroscopy Time: 10 minutes 12 seconds (1256 mGy).  COMPLICATIONS: None immediate.  TECHNIQUE: Informed written consent was obtained from the patient after a thorough discussion of the procedural risks, benefits and alternatives. All questions were addressed. Maximal Sterile Barrier Technique was utilized including caps, mask, sterile gowns, sterile gloves, sterile drape, hand hygiene and skin antiseptic. A timeout was performed prior to the initiation of the procedure.  The right radial artery was interrogated and imaged for a right radial  approach with ultrasound. The artery was noted to be severely narrowed distally. Therefore, this approach was not used.  The right groin was prepped and draped in the usual sterile fashion. Thereafter using modified Seldinger technique, transfemoral access into the right common femoral artery was obtained without difficulty. Over a 0.035 inch guidewire, a 5 French Pinnacle sheath was inserted. Through this, and also over 0.035 inch guidewire, a 5 [place] JB 1 catheter was advanced to the aortic arch region and selectively positioned in the right common carotid artery, right subclavian artery, the left common carotid artery and the left subclavian artery.  FINDINGS: The innominate artery injection demonstrates the origins of the right subclavian artery and the right common carotid artery to be widely patent.  The right common carotid arteriogram demonstrates the right external carotid artery and its major branches to be widely patent.  The right internal carotid artery just distal to the bulb has a mild approximately 20% stenosis by the NASCET criteria. No evidence of intraluminal filling defects or of ulcerations is seen.  More distally, the vessel is seen to opacify to the cranial skull base.  The petrous segment is widely patent.  There is diffuse narrowing of the cavernous segment with approximately 50% narrowing of the right internal carotid supraclinoid segment. The right middle cerebral artery proximally demonstrates mild arteriosclerotic narrowing. The superior division demonstrates wide patency into the capillary and venous phases. The inferior division demonstrates a segmental area of moderate stenosis. Hypoplastic right anterior cerebral artery is seen with angiographic occlusion in the mid A2 segment.  The origin of the right vertebral artery is widely patent. The vessel is seen to opacify to the cranial skull base with mild areas of focal narrowing. The right vertebrobasilar junction is widely  patent.  The right posteroinferior cerebellar artery demonstrates a high-grade stenosis in its proximal 1/3.  More distally, the opacified portion of the basilar artery, the superior cerebellar arteries and transiently the posterior cerebral arteries and the anterior-inferior cerebellar arteries demonstrate patency into the capillary and venous phases. Unopacified blood is seen in hypoplastic basilar artery from the contralateral vertebral artery.  The left common carotid arteriogram demonstrates the left external carotid artery and its major branches to be widely patent.  The left internal carotid artery just distal to the bulb has a smooth area of atherosclerotic disease associated with narrowing of approximately 75%. No evidence of intraluminal filling defects is seen.  More distally, the left internal  carotid artery is seen to opacify to the cranial skull base. Wide patency is seen of the petrous segment.  Tapered narrowing of the cavernous segment to the level of the takeoff of the ophthalmic artery.  Mild focal irregularity is also seen of the supraclinoid left ICA. A left posterior communicating artery is seen opacifying the left posterior cerebral artery distribution with focal areas of caliber irregularity.  The left middle cerebral artery M1 segment demonstrates severe 90% stenosis. Patency of the left MCA trifurcation branches is seen into the capillary and the venous phases.  The left anterior cerebral artery opacifies into the capillary and venous phases.  Arising from the distal pericallosal artery is a large saccular aneurysm with mild lobulation. This is measured at approximately 9.7 mm x 7.0 mm.  The left subclavian arteriogram demonstrates atherosclerotic disease involving the proximal left subclavian artery with a mild tapered stenosis of the artery proximal to the origin of the left vertebral artery.  The left vertebral artery is seen to opacify to the cranial skull base. Patency is  seen of the left vertebrobasilar junction and the basilar artery. The opacified portion of the left posteroinferior cerebellar artery demonstrates wide patency.  Also the opacified portions of the basilar artery, the posterior cerebral arteries and the superior cerebellar arteries is grossly intact. Mixing of unopacified blood is seen from contralateral right vertebral artery.  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  Approximately 75% stenosis of the proximal left internal carotid artery just distal to the bulb. Probable angiographic occlusion of the right anterior cerebral artery, in the mid A2 segment.  PLAN: Above findings were discussed with the patient. Prior to consideration of treatment of the left middle cerebral artery M1 segment symptomatic stenosis, and of the large aneurysm in the left pericallosal region, further workup regarding the lower extremity peripheral vascular disease may be necessary given the severely depleted pulses in both feet left greater than right.   Electronically Signed   By: Luanne Bras M.D.   On: 03/21/2019 15:11  IR NEURO EACH ADD'L AFTER BASIC UNI LEFT (MS)  Result Date: 03/26/2019 CLINICAL DATA:  Symptomatic high-grade stenosis of the left middle cerebral artery proximal M1 segment with history of recent ischemic stroke.  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  EXAM: TRANSCATHETER THERAPY EMBOLIZATION  COMPARISON:  Diagnostic catheter arteriogram of March 21, 2019.  MEDICATIONS: Heparin 1000 units IV. Ancef 4 g IV antibiotic was administered within 1 hour of the procedure.  ANESTHESIA/SEDATION:  General anesthesia.  CONTRAST:  Isovue 300 approximately 150 mL.  FLUOROSCOPY TIME:  Fluoroscopy Time: 101 minutes 0 seconds (4155 mGy).  COMPLICATIONS: None immediate.  TECHNIQUE: Informed written consent was obtained from the patient after a thorough discussion of the procedural risks, benefits and alternatives. All questions were addressed. Maximal Sterile Barrier Technique was utilized including caps, mask, sterile gowns, sterile gloves, sterile drape, hand hygiene and skin antiseptic. A timeout was performed prior to the initiation of the procedure.  Direct access to the left common carotid artery was provided by vascular surgery in the OR due to patient's severe peripheral vascular disease, and absent flow in the right radial artery.  A 6 French Pinnacle sheath was inserted  and sutured in the left common carotid artery in the mid section. The patient was brought to the angio suite with the 6 [place] Pinnacle sheath.  This was then connected to continuous heparinized saline infusion following flushing. An arteriogram was then obtained of the left common carotid bifurcation and also intracranially.  FINDINGS: The left common carotid bifurcation again demonstrates the left external carotid artery and its major branches to be widely patent.  The left internal carotid artery just distal to the bulb again demonstrates a significant stenosis just distal to the carotid bulb of approximately 70-75%.  More distally, the left internal carotid artery is seen to opacify to the cranial skull base.  The petrous segment is widely patent.  There is a mild to moderate stenosis of the distal cavernous segment of the left internal carotid artery and to a lesser degree of the supraclinoid left internal carotid artery. A left posterior communicating artery is seen opacifying the left posterior cerebral artery distribution.  The left middle cerebral artery again demonstrates a severe M1 segment stenosis with  opacification of the left MCA trifurcation branches into the capillary and venous phases.  The left anterior cerebral artery demonstrates mild arteriosclerotic changes in the proximal A1 and A2 segments.  More distally, mild arteriosclerotic changes are also noted in the A3 segment of the left anterior cerebral artery pericallosal branch.  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  The subsequent capillary and venous phases are unremarkable.  ENDOVASCULAR TREATMENT OF THE LEFT ANTERIOR CEREBRAL ARTERY PERICALLOSAL REGION ANEURYSM WITH STENT ASSISTED COILING  Initial treatment angioplasty of the left internal carotid approximately was performed following advancement of a 5 mm x 30 mm Viatrac 14 angioplasty microcatheter. This had been prepped and purged with heparinized saline infusion. Over a 014 inch standard Synchro micro guidewire with a J configuration, the combination of the Viatrac 14 angioplasty balloon catheter in the micro guidewire advanced to the proximal left internal carotid artery.  Using a torque device, the micro guidewire was advanced without difficulty to the distal cervical segment of the left internal carotid artery.  This was then followed by the advancement of the Viatrac 5 x 30 mm balloon in place adequate distance at the site of severe stenosis.  Slow control angioplasty was then performed using micro inflation syringe device via micro tubing. Balloon was inflated to 8.3 atmospheres where it was maintained for approximately 1 minute. The balloon was then deflated and advanced more distally. A control arteriogram performed through 5 [place] Pinnacle sheath in the left common carotid artery again demonstrated significantly improved caliber and flow through the angioplastied segment.  The combination of the balloon and the microcatheter were advanced to the horizontal petrous segment of the left internal  carotid artery.  The micro guidewire was removed, and the Viatrac balloon was then exchanged for an 014 inch 300 cm Softip Transend exchange guidewire. Over the exchange micro guidewire, a 5 [place] 115 cm Catalyst guide catheter was advanced and positioned in the horizontal petrous segment of the left internal carotid artery.  The exchange micro guidewire was then removed. Good aspiration was obtained from the hub of the 5 [place] Catalyst guide catheter in the left internal carotid artery. A control arteriogram performed through the 5 [place] Catalyst guide catheter in the left internal carotid artery demonstrated no changes in the intracranial circulation.  Over a 0.014 inch standard Synchro micro guidewire with a  J configuration, a Headway 17 2 tip microcatheter was advanced without difficulty to the supraclinoid left ICA.  The micro guidewire was then gently manipulated with a torque device and advanced through left anterior cerebral artery A1 A2 segments followed by the microcatheter to the A3 segment of the anterior cerebral artery pericallosal branch. The wire was then advanced without difficulty distal to the neck of the aneurysm followed by the microcatheter. The guidewire was removed. Good aspiration obtained from the hub of microcatheter. A gentle control arteriogram performed through the microcatheter demonstrates safe position of tip of the microcatheter. This was then connected to continuous heparinized saline infusion. At this time after having obtained measurements of the pericallosal artery distal and proximal to the aneurysm, a 2.5 mm x 23 mm Lvis junior stent was advanced to the distal end of the microcatheter.  Under constant fluoroscopic guidance using roadmap technique, the delivery microcatheter was retrieved unsheathing the distal and then the proximal Lvis stent. A control arteriogram performed through the 5 [place] Catalyst guide catheter now advanced to the A1 segment of the left  anterior cerebral artery demonstrates safe positioning and excellent apposition of the Lvis stent. At this time, the 014 inch standard Synchro micro guidewire was then advanced through the microcatheter into the aneurysm without difficulty followed by the microcatheter. The guidewire was removed. Good aspiration was obtained with the intra aneurysmal microcatheter. This was then connected to continuous heparinized saline infusion.  The first coil utilized for aneurysm embolization was a Microplex 10 5 mm x 15 mm HyperSoft 3D coil. This was advanced using biplane roadmap technique and constant fluoroscopic guidance in a coaxial manner and with constant heparinized saline infusion to the distal end of the intra-aneurysmal microcatheter. This coil was then advanced under constant fluoroscopic guidance. Prior to its detachment, a control arteriogram performed through the 5 [place] Catalyst guide catheter in the left anterior cerebral artery demonstrates safe positioning of the coil. This was then detached without difficulty. This was subsequently followed by advancement of a 4 mm x 8 mm HyperSoft 3D coil, a 2.5 mm x 8 cm HyperSoft 3D coil, a 3 mm x 6 cm HyperSoft 3D coil, a 3 mm x 4 cm Microplex HyperSoft 3D coil and finally a 2 mm x 6 cm Microplex HyperSoft helical coil. Each of these coils was advanced into the aneurysm under fluoroscopic guidance using biplane roadmap technique. Prior to their detachment, a control arteriogram was performed through the guide catheter to ensure safe positioning of the coil mass.  Following the final coil there was near complete obliteration of the aneurysm.  The microcatheter was gently retrieved from the coil mass without any difficulty. A control arteriogram performed demonstrated near complete occlusion of the aneurysm with wide patency of the Lvis stent across the neck of the aneurysm.  ENDOVASCULAR STENT ASSISTED ANGIOPLASTY OF SYMPTOMATIC HIGH-GRADE STENOSIS OF THE LEFT  MIDDLE CEREBRAL ARTERY M1 SEGMENT  Over a 0.014 inch standard Synchro micro guidewire, a 2 mm x 15 mm Gateway angioplasty balloon catheter which had been prepped with 50% contrast and 50% heparinized saline infusion was advanced through the 5 [place] Catalyst guide catheter in the petrous segment of the left internal carotid artery to the supraclinoid left ICA.  Using a torque device, access through the nearly completely occluded left middle cerebral artery was achieved with the micro guidewire which was advanced to the distal M2 M3 region of the inferior division. The balloon was then advanced without difficulty and positioned such that the  proximal and the distal markers were adequate distant and covered the segmental high-grade stenosis.  A control angioplasty was then performed using micro inflation syringe device via micro tubing. Slow increments were performed in the atmospheric pressures of the balloon to 4.9 atmospheres achieving approximately 1.95 mm diameter of the balloon. This was maintained for approximately 90 seconds. The balloon was then gently deflated and retrieved proximally whilst the wire was maintained distally in the M2 M3 region.  A control arteriogram performed through the 5 [place] Catalyst guide catheter demonstrated significantly improved caliber and flow through the angioplastied segment. The Gateway balloon was then advanced to the M2 M3 region over the micro guidewire. The micro guidewire was then retrieved. Free aspiration of blood was noted from the hub of the Gateway microcatheter.  This was in turn exchanged for a 014 inch 300 cm Softip Transend exchange micro guidewire using biplane roadmap technique and constant fluoroscopic guidance. The micro guidewire had a J shaped configuration to avoid dissections or inducing spasm.  A control arteriogram performed through the Catalyst guide catheter in the left internal carotid artery following the exchange micro guidewire placement  demonstrated significantly improved caliber and flow through the angioplastied segment to approximately 60-70% patency.  It was elected to proceed with placement of a Wingspan stent across the angioplastied segment of the left middle cerebral artery.  A 3.5 mm x 20 mm Wingspan system was then prepped and purged with heparinized saline infusion in its housing.  The inner core was purged with heparinized saline infusion. Also the delivery microcatheter was purged with heparinized saline infusion and retrogradely via a Tuohy Borst and a 3 way stopcock to ensure absence of any air.  The system was then advanced as a unit over the exchange micro guidewire without difficulty.  The distal and the proximal markers of the stent were then advanced such that they were adequate distant at the site of the angioplastied segment.  The Tuohy Borst at the hub of the microcatheter was then loosened. The inner core was then advanced with its butt at the proximal portion of the stent. Thereafter, whilst holding the inner core of the stent, the delivery microcatheter was retrieved unsheathing the distal and then the proximal portion of the stent. Once delivered, a control arteriogram performed through the 5 [place] Catalyst guide catheter demonstrated excellent apposition of the stent and coverage proximally and distally. Wide patency of the left middle cerebral artery angioplasty and stent segment was seen of almost 70-80%.  The delivery micro guidewire was then retrieved and removed.  Control arteriograms performed through the left internal carotid artery continued to demonstrate excellent flow through left MCA distribution without evidence of intraluminal filling defects or of occlusions involving the anterior or the middle cerebral artery distributions.  6 mg of intra-arterial Integrilin were given following the placement of the Wingspan stent system.  A control arteriogram performed through the 6 French Pinnacle sheath  following removal of the Catalyst guide catheter now demonstrated the previously angioplastied segment of the left internal carotid artery proximally. The exchange micro guidewire had been maintained distally in the horizontal petrous segment.  Noted was modest recoil at the site of the previous angioplasty. It was, therefore, decided to proceed with placement of a stent across the angioplastied segment of the previously severely stenotic left internal carotid artery.  Over a 0.014 inch exchange micro guidewire, with the distal end in the petrous horizontal segment, measurements were performed of the proximal and the distal landing zones of  the planned stent. After evaluating the measurements obtained, it was decided to proceed with placement of a 9-7 mm x 40 mm Xact stent. This was prepped and purged retrogradely with heparinized saline infusion. Using the rapid exchange technique, the stent delivery system was advanced without difficulty and positioned such that the distal and the proximal markers were adequate distant from the site of the previous angioplasty segment. This stent was then deployed without any difficulty in the usual manner. The delivery catheter system was then retrieved and removed whilst the exchange micro guidewire was maintained distally.  A control arteriogram performed through the 6 French Pinnacle sheath immediately and 10 and 20 minutes placement of the stent continued to demonstrate excellent flow through the stented segment with mild spasm at its distal end. Because of the risk of development of platelet aggregation within the stent, it was decided to give additional 6 mg of Integrilin intra-arterially.  A final control arteriogram performed approximately 25 minutes following placement of the left internal carotid artery stent continued to demonstrate excellent flow intracranially and extra cranially. 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 The left middle cerebral artery angioplasty in the stented segment now demonstrated close to 80% patency without gross evidence of intraluminal filling defects or of occlusions.  Throughout the procedure, the patient's blood pressure and neurological status remained stable. The  patient's ACT was maintained in the region of approximately 130 seconds throughout the procedure.  A flat panel CT of the brain performed with the patient on the table demonstrated no evidence of intracranial hemorrhage, mass effect or midline shift.  The patient was then returned to the OR for removal of the 6 French Pinnacle sheath by vascular surgery as per plan.  It was planned to a keep the patient intubated on ventilator for airway protection given the antiplatelets and anticoagulation given for the entirety of the procedure.  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  PLAN: Patient to remain intubated for airway protection given the direct left carotid artery access and use of antiplatelets, and anticoagulation.   Electronically Signed   By: Luanne Bras M.D.   On: 03/28/2019 09:53       Subjective: He denies having any major complaints at this time.  Afebrile.  Discharge Exam: Vitals:   04/03/19 0348 04/03/19 0818  BP: 122/67 (!) 158/75  Pulse: 86 73  Resp: 20 20  Temp: 99.6 F (37.6 C) 99.6 F (37.6 C)  SpO2: 100% 99%   Vitals:   04/02/19 2100 04/03/19 0000 04/03/19 0348 04/03/19 0818  BP:  124/67 122/67 (!) 158/75  Pulse:  87 86 73  Resp: 20 20 20 20   Temp:  99.1 F (37.3 C) 99.6  F (37.6 C) 99.6 F (37.6 C)  TempSrc:  Oral Oral Oral  SpO2:  100% 100% 99%  Weight:      Height:        General: Pt is alert, awake, not in acute distress Cardiovascular: RRR, S1/S2 +, no rubs, no gallops Respiratory: CTA bilaterally, no wheezing, no rhonchi Abdominal: Soft, NT, ND, bowel sounds + Extremities: no edema, no cyanosis    The results of significant  diagnostics from this hospitalization (including imaging, microbiology, ancillary and laboratory) are listed below for reference.     Microbiology: Recent Results (from the past 240 hour(s))  SARS CORONAVIRUS 2 (TAT 6-24 HRS) Nasopharyngeal Nasopharyngeal Swab     Status: None   Collection Time: 03/24/19  7:29 PM   Specimen: Nasopharyngeal Swab  Result Value Ref Range Status   SARS Coronavirus 2 NEGATIVE NEGATIVE Final    Comment: (NOTE) SARS-CoV-2 target nucleic acids are NOT DETECTED. The SARS-CoV-2 RNA is generally detectable in upper and lower respiratory specimens during the acute phase of infection. Negative results do not preclude SARS-CoV-2 infection, do not rule out co-infections with other pathogens, and should not be used as the sole basis for treatment or other patient management decisions. Negative results must be combined with clinical observations, patient history, and epidemiological information. The expected result is Negative. Fact Sheet for Patients: SugarRoll.be Fact Sheet for Healthcare Providers: https://www.woods-mathews.com/ This test is not yet approved or cleared by the [place] FDA and  has been authorized for detection and/or diagnosis of SARS-CoV-2 by FDA under an Emergency Use Authorization (EUA). This EUA will remain  in effect (meaning this test can be used) for the duration of the COVID-19 declaration under Section 56 4(b)(1) of the Act, 21 U.S.C. section 360bbb-3(b)(1), unless the authorization is terminated or revoked  sooner. Performed at [facility], [place] [address], [place], [place] [address]   Surgical PCR screen     Status: None   Collection Time: 03/26/19  2:15 AM   Specimen: Nasal Mucosa; Nasal Swab  Result Value Ref Range Status   MRSA, PCR NEGATIVE NEGATIVE Final   Staphylococcus aureus NEGATIVE NEGATIVE Final    Comment: (NOTE) The Xpert SA Assay (FDA approved for NASAL specimens in patients 76 years of age and older), is one component of a comprehensive surveillance program. It is not intended to diagnose infection nor to guide or monitor treatment. Performed at [facility], [place] [address], [place], [place] [address]   Culture, Urine     Status: None   Collection Time: 03/28/19 10:28 AM   Specimen: Urine, Random  Result Value Ref Range Status   Specimen Description URINE, RANDOM  Final   Special Requests NONE  Final   Culture   Final    NO GROWTH Performed at [facility], [place] [address], [place], [place] [address]    Report Status 03/29/2019 FINAL  Final  Culture, blood (routine x 2)     Status: None (Preliminary result)   Collection Time: 04/01/19 10:34 AM   Specimen: BLOOD  Result Value Ref Range Status   Specimen Description BLOOD LEFT ANTECUBITAL  Final   Special Requests   Final    BOTTLES DRAWN AEROBIC AND ANAEROBIC Blood Culture adequate volume   Culture   Final    NO GROWTH < 24 HOURS Performed at [facility], [place] [address] [place], [place] [address]    Report Status PENDING  Incomplete  Culture, blood (routine x 2)     Status: None (Preliminary result)   Collection Time: 04/01/19 10:37 AM   Specimen: BLOOD LEFT HAND  Result Value Ref Range Status   Specimen Description BLOOD LEFT HAND  Final   Special Requests   Final    BOTTLES DRAWN AEROBIC AND ANAEROBIC Blood Culture adequate volume   Culture   Final    NO GROWTH < 24 HOURS Performed at [facility], [place]  [address] [place], [place] [address]    Report Status PENDING   Incomplete     Labs: BNP (last 3 results) No results for input(s): BNP in the last 8760 hours. Basic Metabolic Panel: Recent Labs  Lab 03/28/19 0633 03/29/19 0528 03/30/19 0640 03/31/19 0619  NA 138 139 138 136  K 3.2* 3.4* 3.4* 3.5  CL 110 108 107 101  CO2 20* 22 23 23   GLUCOSE 85 117* 112* 100*  BUN 11 13 12 10   CREATININE 1.36* 1.10 0.97 0.93  CALCIUM 7.8* 7.9* 8.0* 8.5*  MG 1.7 2.3  --  2.0  PHOS 2.9 2.6  --   --    Liver Function Tests: Recent Labs  Lab 03/28/19 0633 03/28/19 1553  AST 26 34  ALT 17 19  ALKPHOS 71 89  BILITOT 0.7 0.6  PROT 5.6* 6.1*  ALBUMIN 2.3* 2.3*   No results for input(s): LIPASE, AMYLASE in the last 168 hours. No results for input(s): AMMONIA in the last 168 hours. CBC: Recent Labs  Lab 03/28/19 0633 03/29/19 0528 03/30/19 0640 03/31/19 0619 04/02/19 1100  WBC 7.1 6.6 7.6 8.6 9.3  NEUTROABS  --   --   --   --  7.8*  HGB 8.2* 7.7* 7.4* 7.9* 8.1*  HCT 24.9* 23.9* 22.7* 23.7* 24.5*  MCV 94.7 97.6 96.2 93.7 93.5  PLT 200 190 200 244 318   Cardiac Enzymes: No results for input(s): CKTOTAL, CKMB, CKMBINDEX, TROPONINI in the last 168 hours. BNP: Invalid input(s): POCBNP CBG: Recent Labs  Lab 04/02/19 0553 04/02/19 1127 04/02/19 1713 04/02/19 2118 04/03/19 0617  GLUCAP 137* 126* 111* 86 82   D-Dimer No results for input(s): DDIMER in the last 72 hours. Hgb A1c No results for input(s): HGBA1C in the last 72 hours. Lipid Profile No results for input(s): CHOL, HDL, LDLCALC, TRIG, CHOLHDL, LDLDIRECT in the last 72 hours. Thyroid function studies No results for input(s): TSH, T4TOTAL, T3FREE, THYROIDAB in the last 72 hours.  Invalid input(s): FREET3 Anemia work up No results for input(s): VITAMINB12, FOLATE, FERRITIN, TIBC, IRON, RETICCTPCT in the last 72 hours. Urinalysis    Component Value Date/Time   COLORURINE BROWN (A) 03/29/2019 1751   APPEARANCEUR TURBID (A) 03/29/2019 1751   LABSPEC 1.020 03/29/2019 1751    PHURINE 6.5 03/29/2019 1751   GLUCOSEU 100 (A) 03/29/2019 1751   HGBUR LARGE (A) 03/29/2019 1751   BILIRUBINUR SMALL (A) 03/29/2019 1751   KETONESUR NEGATIVE 03/29/2019 1751   PROTEINUR 100 (A) 03/29/2019 1751   NITRITE POSITIVE (A) 03/29/2019 1751   LEUKOCYTESUR TRACE (A) 03/29/2019 1751   Sepsis Labs Invalid input(s): PROCALCITONIN,  WBC,  LACTICIDVEN Microbiology Recent Results (from the past 240 hour(s))  SARS CORONAVIRUS 2 (TAT 6-24 HRS) Nasopharyngeal Nasopharyngeal Swab     Status: None   Collection Time: 03/24/19  7:29 PM   Specimen: Nasopharyngeal Swab  Result Value Ref Range Status   SARS Coronavirus 2 NEGATIVE NEGATIVE Final    Comment: (NOTE) SARS-CoV-2 target nucleic acids are NOT DETECTED. The SARS-CoV-2 RNA is generally detectable in upper and lower respiratory specimens during the acute phase of infection. Negative results do not preclude SARS-CoV-2 infection, do not rule out co-infections with other pathogens, and should not be used as the sole basis for treatment or other patient management decisions. Negative results must be combined with clinical observations, patient history, and epidemiological information. The expected result is Negative. Fact Sheet for Patients: SugarRoll.be Fact Sheet for Healthcare Providers: https://www.woods-mathews.com/ This test is not  yet approved or cleared by the [place] and  has been authorized for detection and/or diagnosis of SARS-CoV-2 by FDA under an Emergency Use Authorization (EUA). This EUA will remain  in effect (meaning this test can be used) for the duration of the COVID-19 declaration under Section 56 4(b)(1) of the Act, 21 U.S.C. section 360bbb-3(b)(1), unless the authorization is terminated or revoked sooner. Performed at [facility], [place] [address], [place], [place] [address]   Surgical PCR screen     Status: None   Collection Time: 03/26/19  2:15 AM    Specimen: Nasal Mucosa; Nasal Swab  Result Value Ref Range Status   MRSA, PCR NEGATIVE NEGATIVE Final   Staphylococcus aureus NEGATIVE NEGATIVE Final    Comment: (NOTE) The Xpert SA Assay (FDA approved for NASAL specimens in patients 97 years of age and older), is one component of a comprehensive surveillance program. It is not intended to diagnose infection nor to guide or monitor treatment. Performed at [facility], [place] [address] [place], [place] [address]   Culture, Urine     Status: None   Collection Time: 03/28/19 10:28 AM   Specimen: Urine, Random  Result Value Ref Range Status   Specimen Description URINE, RANDOM  Final   Special Requests NONE  Final   Culture   Final    NO GROWTH Performed at [facility], [place] [address] [place], [place] [address]    Report Status 03/29/2019 FINAL  Final  Culture, blood (routine x 2)     Status: None (Preliminary result)   Collection Time: 04/01/19 10:34 AM   Specimen: BLOOD  Result Value Ref Range Status   Specimen Description BLOOD LEFT ANTECUBITAL  Final   Special Requests   Final    BOTTLES DRAWN AEROBIC AND ANAEROBIC Blood Culture adequate volume   Culture   Final    NO GROWTH < 24 HOURS Performed at [facility], [place] [address], [place], [place] [address]    Report Status PENDING  Incomplete  Culture, blood (routine x 2)     Status: None (Preliminary result)   Collection Time: 04/01/19 10:37 AM   Specimen: BLOOD LEFT HAND  Result Value Ref Range Status   Specimen Description BLOOD LEFT HAND  Final   Special Requests   Final    BOTTLES DRAWN AEROBIC AND ANAEROBIC Blood Culture adequate volume   Culture   Final    NO GROWTH < 24 HOURS Performed at [facility], [place] [address], [place], [place] [address]    Report Status PENDING  Incomplete     Time coordinating discharge: Over 30 minutes  SIGNED:   Yaakov Guthrie, MD  [facility] 04/03/2019, 10:33 AM Pager on amion  If 7PM-7AM,  please contact night-coverage [email or web address] Password [email or web address]

## 2019-04-03 NOTE — Progress Notes (Signed)
Jeremy Arn, MD  Physician  Physical Medicine and Rehabilitation  Consult Note  Addendum  Date of Service:  03/28/2019  9:34 AM      Related encounter: Admission (Discharged) from 03/20/2019 in [place] [facility]W [facility]      Expand AllCollapse All   Show:Clear all [x] Manual[x] Template[] Copied  Added by: [x] Angiulli, Lavon Paganini, PA-C[x] Posey Pronto Domenick Bookbinder, MD  [] Hover for details          Physical Medicine and Rehabilitation Consult Reason for Consult: Decreased functional mobility Referring Physician: [facility]     HPI: Jeremy Sherman is a 69 y.o. 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  History taken from chart review and patient, with some assistance, nursing.  Patient lives with spouse and daughter.  1 level home 4 steps to entry.  Works full-time as a Glass blower/designer.  By report patient had episode of dizziness confusion on 03/17/2019.  He went to his PCPs office became unresponsive hypotensive and diaphoretic.  Blood pressure reportedly lower than 100 he was brought by EMS to [facility].  Patient had recently been hospitalized at [facility] 03/11/2019 to 03/13/2019 for hypertensive urgency.  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  TEE at outside hospital showed left atrial appendage thrombus and was placed on IV heparin.  He was transferred to [facility] for further evaluation.  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  Found to have right hemiparesis following procedure.  Follow-up MRI pending. Patient did remain intubated until 03/27/2019.  Patient currently remains on heparin GGT as advised.  He has been receiving Cleviprex for blood  pressure control.  He is on a dysphagia 1 diet.  Therapy evaluations completed and recommendations of physical medicine rehab consult.   Review of Systems  Constitutional: Positive for malaise/fatigue. Negative for chills and fever.  HENT: Negative for hearing loss.   Eyes: Negative for blurred vision and double vision.  Respiratory: Negative for cough and shortness of breath.   Cardiovascular: Positive for leg swelling. Negative for chest pain and palpitations.  Gastrointestinal: Positive for constipation. Negative for heartburn, nausea and vomiting.  Genitourinary: Positive for urgency. Negative for dysuria, flank pain and hematuria.  Musculoskeletal: Positive for joint pain and myalgias.  Skin: Negative for rash.  Neurological: Positive for focal weakness and weakness.        Past Medical History:  Diagnosis Date  . CAD (coronary artery disease)    . Carotid stenosis, left      L ICA 50%  . Hyperlipidemia    . Hypertension    . Hypertensive urgency 03/11/2019  . 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 Prediabetes           Past Surgical History:  Procedure Laterality Date  . CORONARY ARTERY BYPASS GRAFT   07/02/2007    [place]-> LAD SVG-> PD branch of RCA [name]) at [place] Left 03/26/2019    Procedure: EXPOSURE OF LEFT COMMON CAROTID ARTERY AND PLACEMENT OF SHEATH;  Surgeon: Marty Heck, MD;  Location: [place];  Service: Vascular;  Laterality: Left;  . ENDARTERECTOMY Left 03/26/2019    Procedure: REMOVAL OF CAROTID SHEATH AND CLOSURE  OF  CAROTID ARTERY;  Surgeon: Serafina Mitchell, MD;  Location[facility] OR;  Service: Vascular;  Laterality: Left;  . IR ANGIO INTRA EXTRACRAN SEL COM CAROTID INNOMINATE BILAT MOD SED   03/21/2019  . IR ANGIO VERTEBRAL SEL SUBCLAVIAN INNOMINATE BILAT MOD SED   03/21/2019         Family History  Problem Relation Age of Onset  . Stroke Mother      Social History:  reports that he has been smoking  cigarettes. He started smoking about 45 years ago. He has a 22.50 pack-year smoking history. He has never used smokeless tobacco. He reports previous alcohol use. No history on file for drug. Allergies: No Known Allergies       Medications Prior to Admission  Medication Sig Dispense Refill  . aspirin EC 81 MG tablet Take 81 mg by mouth daily.      Marland Kitchen omega-3 acid ethyl esters (LOVAZA) 1 g capsule Take 1 g by mouth daily.          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 Cueing and assist needed at BLEs and assist for trunk elevation Transfers Overall transfer level: Needs assistance Equipment used: Rolling walker (2 wheeled) Transfers: Sit to/from Stand Sit to Stand: Min assist Stand pivot transfers: Min assist General transfer comment: not tested this date due to MD request to hold while carotid drain in place Ambulation/Gait Ambulation/Gait assistance: Min assist Gait Distance (Feet): 60 Feet Assistive device: Rolling walker (2 wheeled) Gait Pattern/deviations: Wide base of support, Step-through pattern,  Decreased stride length, Trunk flexed General Gait Details: Min assist for steadying, verbal cuing for upright posture and use of RW to offweight LEs as needed for weakness. Pt with mild L knee buckling noted this session, corrected by pt. Pt with standing rest breaks x2, lasting ~30 seconds each. Gait velocity: decr Gait velocity interpretation: >2.62 ft/sec, indicative of community ambulatory Stairs: Yes Stairs assistance: Min assist Stair Management: One rail Left, Forwards, Alternating pattern Number of Stairs: 5 General stair comments: safe with use of rails   ADL: ADL Overall ADL's : Needs assistance/impaired Eating/Feeding: NPO Grooming: Minimal assistance, Sitting Upper Body Bathing: Moderate assistance, Sitting Lower Body Bathing: Maximal assistance, Sit to/from stand Upper Body Dressing : Moderate assistance, Sitting Lower Body Dressing: Maximal assistance, Sit to/from stand Toilet Transfer: Minimal assistance, Ambulation, Comfort height toilet, Grab bars Toileting- Clothing Manipulation and Hygiene: Minimal assistance, Sit to/from stand Toileting - Clothing Manipulation Details (indicate cue type and reason): unaware of incontinence during session Tub/ Shower Transfer: Min guard, 3 in 1, Ambulation Tub/Shower Transfer Details (indicate cue type and reason): education given on use of 3:1 in tub shower Functional mobility during ADLs: Minimal assistance General ADL Comments: transfers not attempted 2/2 carotid drain in  place (MD request) pt with significant status change from original eval   Cognition: Cognition Overall Cognitive Status: Impaired/Different from baseline Arousal/Alertness: Awake/alert Orientation Level: Oriented X4 Attention: Focused, Sustained Focused Attention: Appears intact Sustained Attention: Appears intact Memory: Impaired Memory Impairment: Decreased short term memory Decreased Short Term Memory: Verbal basic Problem Solving: Impaired Problem  Solving Impairment: Verbal complex Executive Function: Reasoning Reasoning: Impaired Reasoning Impairment: Verbal complex Cognition Arousal/Alertness: Suspect due to medications, Lethargic Behavior During Therapy: Flat affect Overall Cognitive Status: Impaired/Different from baseline Area of Impairment: Safety/judgement, Problem solving, Awareness, Following commands Following Commands: Follows one step commands consistently, Follows one step commands with increased time Safety/Judgement: Decreased awareness of safety Awareness: Intellectual Problem Solving: Difficulty sequencing, Requires verbal cues, Slow processing, Requires tactile cues General Comments: pt presenting with status change from initial eval, much more lethargic this date- stating "there ya go" to many statements when not always appropriate. Decreased overall awareness of deficits   Blood pressure (!) 122/54, pulse 72, temperature 99.1 F (37.3 C), temperature source Axillary, resp. rate 16, height 6\' 5"  (1.956 m), weight 106.1 kg, SpO2 99 %. Physical Exam  Vitals reviewed. Constitutional: He appears well-developed.  Obese  HENT:  Head: Normocephalic and atraumatic.  Eyes: Right eye exhibits no discharge. Left eye exhibits no discharge.  ?  Disconjugate gaze-trouble keeping eyes open.  Neck: No tracheal deviation present. No thyromegaly present.  Respiratory: Effort normal. No stridor. No respiratory distress.  + [place]  GI: Soft. He exhibits distension.  Genitourinary:    Genitourinary Comments: Foley with sanguinous drainage   Musculoskeletal:     Comments: Generalized edema  Neurological: He is alert.  He does follow simple commands.   Provides his name and age. Motor: RUE: Shoulder abduction 2-/5, elbow flexion/extension 2+/5, handgrip 3/5 Right lower extremity: Hip flexion, knee extension 2+/5, ankle dorsiflexion, 5/5 in Left upper extremity: 3/5 proximal distal Left lower extremity: 3/5 proximal distal  Skin:   Left neck incision C/D/I Vascular changes bilateral lower extremities  Psychiatric: His affect is blunt. His speech is delayed. He is slowed.      Lab Results Last 24 Hours       Results for orders placed or performed during the hospital encounter of 03/20/19 (from the past 24 hour(s))  Glucose, capillary     Status: None    Collection Time: 03/27/19 11:26 AM  Result Value Ref Range    Glucose-Capillary 78 70 - 99 mg/dL    Comment 1 Notify RN      Comment 2 Document in Chart    Heparin level (unfractionated)     Status: Abnormal    Collection Time: 03/27/19  1:38 PM  Result Value Ref Range    Heparin Unfractionated 0.24 (L) 0.30 - 0.70 IU/mL  Platelet inhibition p2y12 (Not at [facility])     Status: Abnormal    Collection Time: 03/27/19  2:11 PM  Result Value Ref Range    Platelet Function  P2Y12 21 (L) 182 - 335 PRU  Glucose, capillary     Status: None    Collection Time: 03/27/19  4:17 PM  Result Value Ref Range    Glucose-Capillary 79 70 - 99 mg/dL  Glucose, capillary     Status: None    Collection Time: 03/27/19  7:34 PM  Result Value Ref Range    Glucose-Capillary 90 70 - 99 mg/dL  Heparin level (unfractionated)     Status: None    Collection Time: 03/27/19 10:01 PM  Result Value Ref Range  Heparin Unfractionated 0.31 0.30 - 0.70 IU/mL  Glucose, capillary     Status: None    Collection Time: 03/27/19 11:15 PM  Result Value Ref Range    Glucose-Capillary 82 70 - 99 mg/dL  CBC     Status: Abnormal    Collection Time: 03/28/19  6:33 AM  Result Value Ref Range    WBC 7.1 4.0 - 10.5 K/uL    RBC 2.63 (L) 4.22 - 5.81 MIL/uL    Hemoglobin 8.2 (L) 13.0 - 17.0 g/dL    HCT 24.9 (L) 39.0 - 52.0 %    MCV 94.7 80.0 - 100.0 fL    MCH 31.2 26.0 - 34.0 pg    MCHC 32.9 30.0 - 36.0 g/dL    RDW 15.7 (H) 11.5 - 15.5 %    Platelets 200 150 - 400 K/uL    nRBC 0.0 0.0 - 0.2 %  Magnesium     Status: None    Collection Time: 03/28/19  6:33 AM  Result Value Ref Range    Magnesium 1.7  1.7 - 2.4 mg/dL  Phosphorus     Status: None    Collection Time: 03/28/19  6:33 AM  Result Value Ref Range    Phosphorus 2.9 2.5 - 4.6 mg/dL  Comprehensive metabolic panel     Status: Abnormal    Collection Time: 03/28/19  6:33 AM  Result Value Ref Range    Sodium 138 135 - 145 mmol/L    Potassium 3.2 (L) 3.5 - 5.1 mmol/L    Chloride 110 98 - 111 mmol/L    CO2 20 (L) 22 - 32 mmol/L    Glucose, Bld 85 70 - 99 mg/dL    BUN 11 8 - 23 mg/dL    Creatinine, Ser 1.36 (H) 0.61 - 1.24 mg/dL    Calcium 7.8 (L) 8.9 - 10.3 mg/dL    Total Protein 5.6 (L) 6.5 - 8.1 g/dL    Albumin 2.3 (L) 3.5 - 5.0 g/dL    AST 26 15 - 41 U/L    ALT 17 0 - 44 U/L    Alkaline Phosphatase 71 38 - 126 U/L    Total Bilirubin 0.7 0.3 - 1.2 mg/dL    GFR calc non Af Amer 52 (L) >60 mL/min    GFR calc Af Amer >60 >60 mL/min    Anion gap 8 5 - 15  Triglycerides     Status: Abnormal    Collection Time: 03/28/19  6:33 AM  Result Value Ref Range    Triglycerides 186 (H) <150 mg/dL  Heparin level (unfractionated)     Status: Abnormal    Collection Time: 03/28/19  6:34 AM  Result Value Ref Range    Heparin Unfractionated 0.25 (L) 0.30 - 0.70 IU/mL  Glucose, capillary     Status: None    Collection Time: 03/28/19  8:44 AM  Result Value Ref Range    Glucose-Capillary 83 70 - 99 mg/dL       Imaging Results (Last 48 hours)  CT ANGIO HEAD W OR WO CONTRAST   Result Date: 03/26/2019 CLINICAL DATA:  Follow-up examination for acute stroke, recent ICA and MCA stenting and aneurysm coiling EXAM: CT ANGIOGRAPHY HEAD AND NECK CT PERFUSION BRAIN TECHNIQUE: Multidetector CT imaging of the head and neck was performed using the standard protocol during bolus administration of intravenous contrast. Multiplanar CT image reconstructions and MIPs were obtained to evaluate the vascular anatomy. Carotid stenosis measurements (when applicable) are obtained utilizing NASCET criteria, using  the distal internal carotid diameter as the  denominator. Multiphase CT imaging of the brain was performed following IV bolus contrast injection. Subsequent parametric perfusion maps were calculated using RAPID software. CONTRAST:  127mL OMNIPAQUE IOHEXOL 350 MG/ML SOLN COMPARISON:  Comparison made with prior CTA from 03/19/2019. FINDINGS: CT HEAD FINDINGS Brain: Generalized age-related cerebral atrophy with chronic small vessel ischemic disease. No acute intracranial hemorrhage. No acute large vessel territory infarct. No mass lesion, midline shift or mass effect. No hydrocephalus. No extra-axial fluid collection. 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 Vascular stent has been placed within the left M1 segment. No hyperdense vessel. Scattered vascular calcifications noted within the carotid siphons. Skull: Scalp soft tissues within normal limits.  Calvarium intact. Sinuses/Orbits: Globes and orbital soft tissues within normal limits. Scattered mucosal thickening noted within the ethmoidal air cells and right maxillary sinus. Few small air-fluid levels noted. Mastoid air cells are clear. Patient is intubated. Other: None. CTA NECK FINDINGS Aortic arch: Visualized aortic arch of normal caliber. Bovine arch with common origin of the right brachiocephalic and left common carotid artery noted. Extensive noncalcified plaque seen throughout the visualized arch and about the origin of the great vessels without hemodynamically significant stenosis. Irregular soft plaque and/or thrombus protruding into the lumen at the origin of the right brachiocephalic artery (series 12, image 38), unchanged. 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 Subclavian arteries otherwise irregular but patent without flow-limiting stenosis. Right carotid system: Right common carotid artery patent from its origin to the bifurcation without stenosis. Mild scattered plaque about the right bifurcation/proximal right ICA  without hemodynamically significant stenosis. Right ICA irregular but patent to the skull base without stenosis, dissection, or occlusion. Left carotid system: Multifocal atheromatous irregularity within the left common carotid artery without stenosis. Interval placement of a vascular stent, proximal aspect at the origin of the left ICA. Mild-to-moderate stenoses involving the mid and distal aspect of the stent, measuring up to approximately 50% by NASCET criteria. Widely patent flow otherwise seen through the stent. No intraluminal thrombus or other complication. Left ICA irregular but otherwise widely patent to the skull base without stenosis, dissection, or occlusion. Vertebral arteries: Both vertebral arteries arise from the subclavian arteries. Vertebral arteries remain widely patent within the neck without stenosis, dissection or occlusion. Skeleton: No acute osseous abnormality. No discrete osseous lesions. Moderate cervical spondylosis noted at C5-6. Patient is edentulous. Other neck: Endotracheal and enteric tubes in place. Postoperative changes from recent cutdown and open exposure of the left common carotid artery seen at the lower anterior left neck. Percutaneous drain remains in place within this region with associated scattered foci of soft tissue emphysema. Associated postoperative swelling and blood products present within this region as well. Small linear contrast blush within this region adjacent to the surgical drain likely reflects a small amount of persistent bleeding, likely venous in nature (series 5, image 36). No other active contrast extravasation. Upper chest: Layering bilateral pleural effusions with associated atelectasis partially visualized. Paraseptal emphysematous changes noted at the lung apices. Median sternotomy partially visualized. Review of the MIP images confirms the above findings CTA HEAD FINDINGS Anterior circulation: Petrous segments are widely patent. Scattered  atherosclerotic change throughout the carotid siphons with associated moderate multifocal narrowing, unchanged. Left A1 widely patent. Hypoplastic right A1. Normal anterior communicating artery. Partially azygos ACA noted. 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 No visible neck remnant identified. Grossly patent flow through the adjacent stent. Interval placement of  a vascular stent across the previously seen severe left M1 stenosis. Patent flow is seen through the stent. Left MCA branches perfused distally. Mild stenosis involving the proximal right M1 segment noted, stable. Distal right MCA branches well perfused and stable from previous. Posterior circulation: Vertebral arteries remain widely patent to the vertebrobasilar junction. Posterior inferior cerebral arteries patent bilaterally. Basilar diffusely diminutive with associated mild multifocal narrowing. Superior cerebral arteries patent bilaterally. PCA supplied via hypoplastic P1 segments as well as robust bilateral posterior communicating arteries. Prominent atherosclerotic change throughout both PCAs with associated moderate to severe multifocal stenoses, right worse than left, grossly stable. Venous sinuses: Grossly patent allowing for timing of the contrast bolus Anatomic variants: Predominant fetal type origin of the PCAs. Hypoplastic right A1 segment. Review of the MIP images confirms the above findings CT Brain Perfusion Findings: CBF (<30%) Volume: 32mL Perfusion (Tmax>6.0s) volume: 30mL Mismatch Volume: 68mL Infarction Location:Negative CT perfusion for acute ischemia. On source perfusion maps, there is increased cerebral blood volume and cerebral blood flow with mildly decreased mean transit time and T-max, likely reflecting improved cerebrovascular flow to the left cerebral hemisphere due to interval stenting of the left ICA and MCA. No other perfusion abnormality. IMPRESSION: CT HEAD IMPRESSION: 1. No acute  intracranial abnormality. 2. 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 3. 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 CTA HEAD AND NECK IMPRESSION: 1. Negative CTA for emergent large vessel occlusion. 2. Interval stenting at the proximal left ICA and left M1 segment without complication. Patent flow seen through both stents. 3. 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 No residual neck remnant identified. 4. Postoperative changes from interval cutdown for left carotid artery exposure/access. Surgical drain remains in place. Small blush of contrast adjacent to the drain consistent with a small focus of active bleeding, likely venous in nature. 5. Otherwise stable CTA with extensive atherosclerotic change elsewhere throughout the major arterial vasculature of the head and neck. CT PERFUSION IMPRESSION: 1. Negative CT perfusion for acute core infarct. 2. Increased cerebral blood flow and blood volume with decreased T-max within the left cerebral hemisphere, reflecting improved vascular flow to the left MCA distribution due to the left ICA and MCA stents. 3. Otherwise negative CT perfusion, with no other perfusion abnormality. These results were communicated to Dr. Lorraine Lax at 9:30 pmon 1/27/2021by text page via the [facility] messaging system. Electronically Signed   By: Jeannine Boga M.D.   On: 03/26/2019 22:49    CT ANGIO NECK W OR WO CONTRAST   Result Date: 03/26/2019 CLINICAL DATA:  Follow-up examination for acute stroke, recent ICA and MCA stenting and aneurysm coiling EXAM: CT ANGIOGRAPHY HEAD AND NECK CT PERFUSION BRAIN TECHNIQUE: Multidetector CT imaging of the head and neck was performed using the standard protocol during bolus administration of intravenous contrast. Multiplanar CT image reconstructions and MIPs were obtained to evaluate the vascular anatomy. Carotid stenosis measurements (when  applicable) are obtained utilizing NASCET criteria, using the distal internal carotid diameter as the denominator. Multiphase CT imaging of the brain was performed following IV bolus contrast injection. Subsequent parametric perfusion maps were calculated using RAPID software. CONTRAST:  133mL OMNIPAQUE IOHEXOL 350 MG/ML SOLN COMPARISON:  Comparison made with prior CTA from 03/19/2019. FINDINGS: CT HEAD FINDINGS Brain: Generalized age-related cerebral atrophy with chronic small vessel ischemic disease. No acute intracranial hemorrhage. No acute large vessel territory infarct. No mass lesion, midline shift or mass effect. No hydrocephalus. No extra-axial fluid  collection. 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 Vascular stent has been placed within the left M1 segment. No hyperdense vessel. Scattered vascular calcifications noted within the carotid siphons. Skull: Scalp soft tissues within normal limits.  Calvarium intact. Sinuses/Orbits: Globes and orbital soft tissues within normal limits. Scattered mucosal thickening noted within the ethmoidal air cells and right maxillary sinus. Few small air-fluid levels noted. Mastoid air cells are clear. Patient is intubated. Other: None. CTA NECK FINDINGS Aortic arch: Visualized aortic arch of normal caliber. Bovine arch with common origin of the right brachiocephalic and left common carotid artery noted. Extensive noncalcified plaque seen throughout the visualized arch and about the origin of the great vessels without hemodynamically significant stenosis. Irregular soft plaque and/or thrombus protruding into the lumen at the origin of the right brachiocephalic artery (series 12, image 38), unchanged. 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 Subclavian arteries otherwise irregular but patent without flow-limiting stenosis. Right carotid system: Right common carotid artery patent from its origin to the  bifurcation without stenosis. Mild scattered plaque about the right bifurcation/proximal right ICA without hemodynamically significant stenosis. Right ICA irregular but patent to the skull base without stenosis, dissection, or occlusion. Left carotid system: Multifocal atheromatous irregularity within the left common carotid artery without stenosis. Interval placement of a vascular stent, proximal aspect at the origin of the left ICA. Mild-to-moderate stenoses involving the mid and distal aspect of the stent, measuring up to approximately 50% by NASCET criteria. Widely patent flow otherwise seen through the stent. No intraluminal thrombus or other complication. Left ICA irregular but otherwise widely patent to the skull base without stenosis, dissection, or occlusion. Vertebral arteries: Both vertebral arteries arise from the subclavian arteries. Vertebral arteries remain widely patent within the neck without stenosis, dissection or occlusion. Skeleton: No acute osseous abnormality. No discrete osseous lesions. Moderate cervical spondylosis noted at C5-6. Patient is edentulous. Other neck: Endotracheal and enteric tubes in place. Postoperative changes from recent cutdown and open exposure of the left common carotid artery seen at the lower anterior left neck. Percutaneous drain remains in place within this region with associated scattered foci of soft tissue emphysema. Associated postoperative swelling and blood products present within this region as well. Small linear contrast blush within this region adjacent to the surgical drain likely reflects a small amount of persistent bleeding, likely venous in nature (series 5, image 36). No other active contrast extravasation. Upper chest: Layering bilateral pleural effusions with associated atelectasis partially visualized. Paraseptal emphysematous changes noted at the lung apices. Median sternotomy partially visualized. Review of the MIP images confirms the above  findings CTA HEAD FINDINGS Anterior circulation: Petrous segments are widely patent. Scattered atherosclerotic change throughout the carotid siphons with associated moderate multifocal narrowing, unchanged. Left A1 widely patent. Hypoplastic right A1. Normal anterior communicating artery. Partially azygos ACA noted. 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 No visible neck remnant identified. Grossly patent flow through the adjacent stent. Interval placement of a vascular stent across the previously seen severe left M1 stenosis. Patent flow is seen through the stent. Left MCA branches perfused distally. Mild stenosis involving the proximal right M1 segment noted, stable. Distal right MCA branches well perfused and stable from previous. Posterior circulation: Vertebral arteries remain widely patent to the vertebrobasilar junction. Posterior inferior cerebral arteries patent bilaterally. Basilar diffusely diminutive with associated mild multifocal narrowing. Superior cerebral arteries patent bilaterally. PCA supplied via hypoplastic P1 segments as well as robust bilateral posterior communicating  arteries. Prominent atherosclerotic change throughout both PCAs with associated moderate to severe multifocal stenoses, right worse than left, grossly stable. Venous sinuses: Grossly patent allowing for timing of the contrast bolus Anatomic variants: Predominant fetal type origin of the PCAs. Hypoplastic right A1 segment. Review of the MIP images confirms the above findings CT Brain Perfusion Findings: CBF (<30%) Volume: 80mL Perfusion (Tmax>6.0s) volume: 50mL Mismatch Volume: 32mL Infarction Location:Negative CT perfusion for acute ischemia. On source perfusion maps, there is increased cerebral blood volume and cerebral blood flow with mildly decreased mean transit time and T-max, likely reflecting improved cerebrovascular flow to the left cerebral hemisphere due to interval stenting of the left ICA  and MCA. No other perfusion abnormality. IMPRESSION: CT HEAD IMPRESSION: 1. No acute intracranial abnormality. 2. 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 3. 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 CTA HEAD AND NECK IMPRESSION: 1. Negative CTA for emergent large vessel occlusion. 2. Interval stenting at the proximal left ICA and left M1 segment without complication. Patent flow seen through both stents. 3. 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 No residual neck remnant identified. 4. Postoperative changes from interval cutdown for left carotid artery exposure/access. Surgical drain remains in place. Small blush of contrast adjacent to the drain consistent with a small focus of active bleeding, likely venous in nature. 5. Otherwise stable CTA with extensive atherosclerotic change elsewhere throughout the major arterial vasculature of the head and neck. CT PERFUSION IMPRESSION: 1. Negative CT perfusion for acute core infarct. 2. Increased cerebral blood flow and blood volume with decreased T-max within the left cerebral hemisphere, reflecting improved vascular flow to the left MCA distribution due to the left ICA and MCA stents. 3. Otherwise negative CT perfusion, with no other perfusion abnormality. These results were communicated to Dr. Lorraine Lax at 9:30 pmon 1/27/2021by text page via the [facility] messaging system. Electronically Signed   By: Jeannine Boga M.D.   On: 03/26/2019 22:49    CT CEREBRAL PERFUSION W CONTRAST   Result Date: 03/26/2019 CLINICAL DATA:  Follow-up examination for acute stroke, recent ICA and MCA stenting and aneurysm coiling EXAM: CT ANGIOGRAPHY HEAD AND NECK CT PERFUSION BRAIN TECHNIQUE: Multidetector CT imaging of the head and neck was performed using the standard protocol during bolus administration of intravenous contrast. Multiplanar CT image reconstructions and MIPs were  obtained to evaluate the vascular anatomy. Carotid stenosis measurements (when applicable) are obtained utilizing NASCET criteria, using the distal internal carotid diameter as the denominator. Multiphase CT imaging of the brain was performed following IV bolus contrast injection. Subsequent parametric perfusion maps were calculated using RAPID software. CONTRAST:  145mL OMNIPAQUE IOHEXOL 350 MG/ML SOLN COMPARISON:  Comparison made with prior CTA from 03/19/2019. FINDINGS: CT HEAD FINDINGS Brain: Generalized age-related cerebral atrophy with chronic small vessel ischemic disease. No acute intracranial hemorrhage. No acute large vessel territory infarct. No mass lesion, midline shift or mass effect. No hydrocephalus. No extra-axial fluid collection. 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 Vascular stent has been placed within the left M1 segment. No hyperdense vessel. Scattered vascular calcifications noted within the carotid siphons. Skull: Scalp soft tissues within normal limits.  Calvarium intact. Sinuses/Orbits: Globes and orbital soft tissues within normal limits. Scattered mucosal thickening noted within the ethmoidal air cells and right maxillary sinus. Few small air-fluid levels noted. Mastoid air cells are clear. Patient is intubated. Other: None. CTA NECK FINDINGS Aortic arch: Visualized aortic arch of  normal caliber. Bovine arch with common origin of the right brachiocephalic and left common carotid artery noted. Extensive noncalcified plaque seen throughout the visualized arch and about the origin of the great vessels without hemodynamically significant stenosis. Irregular soft plaque and/or thrombus protruding into the lumen at the origin of the right brachiocephalic artery (series 12, image 38), unchanged. 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 Subclavian arteries otherwise irregular but patent without flow-limiting stenosis.  Right carotid system: Right common carotid artery patent from its origin to the bifurcation without stenosis. Mild scattered plaque about the right bifurcation/proximal right ICA without hemodynamically significant stenosis. Right ICA irregular but patent to the skull base without stenosis, dissection, or occlusion. Left carotid system: Multifocal atheromatous irregularity within the left common carotid artery without stenosis. Interval placement of a vascular stent, proximal aspect at the origin of the left ICA. Mild-to-moderate stenoses involving the mid and distal aspect of the stent, measuring up to approximately 50% by NASCET criteria. Widely patent flow otherwise seen through the stent. No intraluminal thrombus or other complication. Left ICA irregular but otherwise widely patent to the skull base without stenosis, dissection, or occlusion. Vertebral arteries: Both vertebral arteries arise from the subclavian arteries. Vertebral arteries remain widely patent within the neck without stenosis, dissection or occlusion. Skeleton: No acute osseous abnormality. No discrete osseous lesions. Moderate cervical spondylosis noted at C5-6. Patient is edentulous. Other neck: Endotracheal and enteric tubes in place. Postoperative changes from recent cutdown and open exposure of the left common carotid artery seen at the lower anterior left neck. Percutaneous drain remains in place within this region with associated scattered foci of soft tissue emphysema. Associated postoperative swelling and blood products present within this region as well. Small linear contrast blush within this region adjacent to the surgical drain likely reflects a small amount of persistent bleeding, likely venous in nature (series 5, image 36). No other active contrast extravasation. Upper chest: Layering bilateral pleural effusions with associated atelectasis partially visualized. Paraseptal emphysematous changes noted at the lung apices. Median  sternotomy partially visualized. Review of the MIP images confirms the above findings CTA HEAD FINDINGS Anterior circulation: Petrous segments are widely patent. Scattered atherosclerotic change throughout the carotid siphons with associated moderate multifocal narrowing, unchanged. Left A1 widely patent. Hypoplastic right A1. Normal anterior communicating artery. Partially azygos ACA noted. 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 No visible neck remnant identified. Grossly patent flow through the adjacent stent. Interval placement of a vascular stent across the previously seen severe left M1 stenosis. Patent flow is seen through the stent. Left MCA branches perfused distally. Mild stenosis involving the proximal right M1 segment noted, stable. Distal right MCA branches well perfused and stable from previous. Posterior circulation: Vertebral arteries remain widely patent to the vertebrobasilar junction. Posterior inferior cerebral arteries patent bilaterally. Basilar diffusely diminutive with associated mild multifocal narrowing. Superior cerebral arteries patent bilaterally. PCA supplied via hypoplastic P1 segments as well as robust bilateral posterior communicating arteries. Prominent atherosclerotic change throughout both PCAs with associated moderate to severe multifocal stenoses, right worse than left, grossly stable. Venous sinuses: Grossly patent allowing for timing of the contrast bolus Anatomic variants: Predominant fetal type origin of the PCAs. Hypoplastic right A1 segment. Review of the MIP images confirms the above findings CT Brain Perfusion Findings: CBF (<30%) Volume: 50mL Perfusion (Tmax>6.0s) volume: 32mL Mismatch Volume: 21mL Infarction Location:Negative CT perfusion for acute ischemia. On source perfusion maps, there is increased cerebral blood volume and cerebral blood  flow with mildly decreased mean transit time and T-max, likely reflecting improved cerebrovascular  flow to the left cerebral hemisphere due to interval stenting of the left ICA and MCA. No other perfusion abnormality. IMPRESSION: CT HEAD IMPRESSION: 1. No acute intracranial abnormality. 2. 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 3. 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 CTA HEAD AND NECK IMPRESSION: 1. Negative CTA for emergent large vessel occlusion. 2. Interval stenting at the proximal left ICA and left M1 segment without complication. Patent flow seen through both stents. 3. 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 No residual neck remnant identified. 4. Postoperative changes from interval cutdown for left carotid artery exposure/access. Surgical drain remains in place. Small blush of contrast adjacent to the drain consistent with a small focus of active bleeding, likely venous in nature. 5. Otherwise stable CTA with extensive atherosclerotic change elsewhere throughout the major arterial vasculature of the head and neck. CT PERFUSION IMPRESSION: 1. Negative CT perfusion for acute core infarct. 2. Increased cerebral blood flow and blood volume with decreased T-max within the left cerebral hemisphere, reflecting improved vascular flow to the left MCA distribution due to the left ICA and MCA stents. 3. Otherwise negative CT perfusion, with no other perfusion abnormality. These results were communicated to Dr. Lorraine Lax at 9:30 pmon 1/27/2021by text page via the [facility] messaging system. Electronically Signed   By: Jeannine Boga M.D.   On: 03/26/2019 22:49    DG CHEST PORT 1 VIEW   Result Date: 03/28/2019 CLINICAL DATA:  Hypertension, stroke EXAM: PORTABLE CHEST 1 VIEW COMPARISON:  Radiograph 03/26/2019 FINDINGS: Interval removal of the endotracheal and transesophageal tubes. Right upper extremity PICC tip terminates at the superior cavoatrial junction. Telemetry leads overlie the chest. Median  sternotomy wires remain intact and aligned. Extensive CABG clips project over the cardiac silhouette. Slightly improved atelectasis when compared to prior. No acute osseous or soft tissue abnormality. IMPRESSION: Improving volumes. Stable cardiomegaly. Removal of the endotracheal and transesophageal tubes. Satisfactory positioning of the right upper extremity PICC. Electronically Signed   By: Lovena Le M.D.   On: 03/28/2019 05:52    DG Chest Port 1 View   Result Date: 03/26/2019 CLINICAL DATA:  Endotracheal tube placement. Hypertension. Coronary artery disease. EXAM: PORTABLE CHEST 1 VIEW COMPARISON:  03/17/2019 from [place]: Prior median sternotomy. Endotracheal tube terminates 5.5 cm above carina. Nasogastric terminates at the body of the stomach with the side port likely just at the gastroesophageal junction. Numerous leads and wires project over the chest. Cardiomegaly accentuated by AP portable technique. The apices are partially excluded. No pleural fluid. Low lung volumes with resultant pulmonary interstitial prominence. New subsegmental atelectasis at the left lung base. IMPRESSION: Nasogastric tube borderline low in position.  Consider advancement. Otherwise, appropriate position of support apparatus. Diminished lung volumes with new left lower lobe subsegmental atelectasis. Cardiomegaly without congestive failure. Electronically Signed   By: Abigail Miyamoto M.D.   On: 03/26/2019 16:16    DG C-Arm 1-60 Min-No Report   Result Date: 03/26/2019 Fluoroscopy was utilized by the requesting physician.  No radiographic interpretation.    ECHOCARDIOGRAM COMPLETE   Result Date: 03/27/2019   ECHOCARDIOGRAM REPORT   Patient Name:   Jeremy Sherman Date of Exam: 03/27/2019 Medical Rec #:  [id number]                 Height:       77.0 in Accession #:  [id number]                Weight:       233.9 lb Date of Birth:  Nov 09, 1950                BSA:          2.39 m Patient Age:    69  years                  BP:           113/101 mmHg Patient Gender: M                         HR:           72 bpm. Exam Location:  Inpatient Procedure: 2D Echo Indications:    Acute Respiratory Insufficiency 518.82 / R06.89  History:        Patient has no prior history of Echocardiogram examinations.                 CAD; Stroke.  Sonographer:    Vikki Ports Turrentine Referring Phys: [place]  1. Left ventricular ejection fraction, by visual estimation, is 50 to 55%. The left ventricle has low normal function. Left ventricular septal wall thickness was mildly increased. Mildly increased left ventricular posterior wall thickness. There is no left ventricular hypertrophy.  2. Left ventricular diastolic parameters are consistent with Grade I diastolic dysfunction (impaired relaxation).  3. Global right ventricle has normal systolic function.The right ventricular size is normal. No increase in right ventricular wall thickness.  4. Left atrial size was severely dilated.  5. Right atrial size was severely dilated.  6. The mitral valve is normal in structure. Trivial mitral valve regurgitation. No evidence of mitral stenosis.  7. The tricuspid valve is normal in structure.  8. The tricuspid valve is normal in structure. Tricuspid valve regurgitation is trivial.  9. The aortic valve is tricuspid. Aortic valve regurgitation is not visualized. No evidence of aortic valve sclerosis or stenosis. 10. The pulmonic valve was normal in structure. Pulmonic valve regurgitation is trivial. 11. Normal pulmonary artery systolic pressure. 12. The inferior vena cava is normal in size with greater than 50% respiratory variability, suggesting right atrial pressure of 3 mmHg. FINDINGS  Left Ventricle: Left ventricular ejection fraction, by visual estimation, is 50 to 55%. The left ventricle has low normal function. The left ventricle is not well visualized. The left ventricular internal cavity size was the left ventricle  is normal in size. Mildly increased left ventricular posterior wall thickness. There is no left ventricular hypertrophy. Left ventricular diastolic parameters are consistent with Grade I diastolic dysfunction (impaired relaxation). Normal left atrial pressure. Right Ventricle: The right ventricular size is normal. No increase in right ventricular wall thickness. Global RV systolic function is has normal systolic function. The tricuspid regurgitant velocity is 1.91 m/s, and with an assumed right atrial pressure  of 3 mmHg, the estimated right ventricular systolic pressure is normal at 17.6 mmHg. Left Atrium: Left atrial size was severely dilated. Right Atrium: Right atrial size was severely dilated Pericardium: There is no evidence of pericardial effusion. Mitral Valve: The mitral valve is normal in structure. Trivial mitral valve regurgitation. No evidence of mitral valve stenosis by observation. Tricuspid Valve: The tricuspid valve is normal in structure. Tricuspid valve regurgitation is trivial. Aortic Valve: The aortic valve is tricuspid. Aortic valve regurgitation is not visualized. The aortic valve is structurally  normal, with no evidence of sclerosis or stenosis. Aortic valve mean gradient measures 4.0 mmHg. Aortic valve peak gradient measures 9.1 mmHg. Aortic valve area, by VTI measures 4.61 cm. Pulmonic Valve: The pulmonic valve was normal in structure. Pulmonic valve regurgitation is trivial. Pulmonic regurgitation is trivial. Aorta: The aortic root, ascending aorta and aortic arch are all structurally normal, with no evidence of dilitation or obstruction. Venous: The inferior vena cava is normal in size with greater than 50% respiratory variability, suggesting right atrial pressure of 3 mmHg. IAS/Shunts: No atrial level shunt detected by color flow Doppler. There is no evidence of a patent foramen ovale. No ventricular septal defect is seen or detected. There is no evidence of an atrial septal defect.   LEFT VENTRICLE PLAX 2D LVIDd:         4.95 cm       Diastology LVIDs:         3.75 cm       LV e' lateral:   6.74 cm/s LV PW:         1.20 cm       LV E/e' lateral: 12.4 LV IVS:        1.20 cm       LV e' medial:    8.81 cm/s LVOT diam:     2.70 cm       LV E/e' medial:  9.5 LV SV:         55 ml LV SV Index:   22.99 LVOT Area:     5.73 cm  LV Volumes (MOD) LV area d, A2C:    31.40 cm LV area d, A4C:    35.10 cm LV area s, A2C:    19.60 cm LV area s, A4C:    21.50 cm LV major d, A2C:   8.01 cm LV major d, A4C:   8.17 cm LV major s, A2C:   7.17 cm LV major s, A4C:   6.43 cm LV vol d, MOD A2C: 104.0 ml LV vol d, MOD A4C: 131.0 ml LV vol s, MOD A2C: 47.5 ml LV vol s, MOD A4C: 62.1 ml LV SV MOD A2C:     56.5 ml LV SV MOD A4C:     131.0 ml LV SV MOD BP:      60.5 ml RIGHT VENTRICLE RV S prime:     15.40 cm/s TAPSE (M-mode): 2.1 cm LEFT ATRIUM              Index       RIGHT ATRIUM           Index LA diam:        4.05 cm  1.69 cm/m  RA Area:     33.00 cm LA Vol (A2C):   92.8 ml  38.82 ml/m RA Volume:   115.00 ml 48.10 ml/m LA Vol (A4C):   109.0 ml 45.59 ml/m LA Biplane Vol: 101.0 ml 42.25 ml/m  AORTIC VALVE AV Area (Vmax):    4.36 cm AV Area (Vmean):   4.36 cm AV Area (VTI):     4.61 cm AV Vmax:           151.00 cm/s AV Vmean:          97.300 cm/s AV VTI:            0.231 m AV Peak Grad:      9.1 mmHg AV Mean Grad:      4.0 mmHg LVOT Vmax:  115.00 cm/s LVOT Vmean:        74.100 cm/s LVOT VTI:          0.186 m LVOT/AV VTI ratio: 0.81  AORTA Ao Root diam: 3.80 cm MITRAL VALVE                        TRICUSPID VALVE MV Area (PHT): 3.74 cm             TR Peak grad:   14.6 mmHg MV PHT:        58.87 msec           TR Vmax:        191.00 cm/s MV Decel Time: 203 msec MV E velocity: 83.40 cm/s 103 cm/s  SHUNTS MV A velocity: 92.20 cm/s 70.3 cm/s Systemic VTI:  0.19 m MV E/A ratio:  0.90       1.5       Systemic Diam: 2.70 cm  Skeet Latch MD Electronically signed by Skeet Latch MD Signature Date/Time:  03/27/2019/4:31:15 PM    Final     VAS US CAROTID   Result Date: 03/27/2019 Carotid Arterial Duplex Study Indications:       CVA, Syncope, Left stent and possible hematoma. Risk Factors:      Hypertension, current smoker, coronary artery disease. Limitations        Today's exam was limited due to the post surgical status of                    the patient, patient on a ventilator and movement. Comparison Study:  No prior study on file for comparison. Performing Technologist: Sharion Dove RVS  Examination Guidelines: A complete evaluation includes B-mode imaging, spectral Doppler, color Doppler, and power Doppler as needed of all accessible portions of each vessel. Bilateral testing is considered an integral part of a complete examination. Limited examinations for reoccurring indications may be performed as noted.  Left Carotid Findings: +----------+--------+--------+--------+------------------+------------------+           PSV cm/sEDV cm/sStenosisPlaque DescriptionComments           +----------+--------+--------+--------+------------------+------------------+ CCA Prox  94      19                                intimal thickening +----------+--------+--------+--------+------------------+------------------+ CCA Distal81      15                                intimal thickening +----------+--------+--------+--------+------------------+------------------+ ICA Prox                                            stent              +----------+--------+--------+--------+------------------+------------------+ ICA Distal52      22                                                   +----------+--------+--------+--------+------------------+------------------+ ECA       55      12                                                   +----------+--------+--------+--------+------------------+------------------+ +----------+--------+--------+--------------+-------------------+  PSV  cm/sEDV cm/sDescribe      Arm Pressure (mmHG) +----------+--------+--------+--------------+-------------------+ Subclavian                Not identified                    +----------+--------+--------+--------------+-------------------+ +---------+--------+--------+--------------+ VertebralPSV cm/sEDV cm/sNot identified +---------+--------+--------+--------------+  Left Stent(s): +--------------+--+--++++ Prox to Stent 6211 +--------------+--+--++++ Proximal Stent8032 +--------------+--+--++++ Mid Stent     [id number] +--------------+--+--++++    Summary:  Left Carotid: Patent stent. Small area of mixed echoes noted at incision site,               no pseudoaneurysm or large hematoma noted. 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 *See table(s) above for measurements and observations.  Electronically signed by Antony Contras MD on 03/27/2019 at 12:48:21 PM.    Final     [place] EKG SITE RITE   Result Date: 03/27/2019 If Site Rite image not attached, placement could not be confirmed due to current cardiac rhythm.      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 Labs independently reviewed.  Records reviewed and summated above.   1. Does the need for close, 24 hr/day medical supervision in concert with the patient's rehab needs make it unreasonable for this patient to be served in a less intensive setting? Yes  2. 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 3. Due to bladder management, safety,  skin/wound care, disease management, pain management and patient education, does the patient require 24 hr/day rehab nursing? Yes 4. Does the patient require coordinated care of a physician, rehab nurse, therapy disciplines of PT/OT/SLP to address physical and functional deficits in the context of the above medical diagnosis(es)? Yes Addressing deficits in the following areas: balance, endurance, locomotion, strength, transferring, bathing, dressing, toileting, swallowing and psychosocial support 5. Can the patient actively participate in an intensive therapy program of at least 3 hrs of therapy per day at least 5 days per week? Potentially 6. The potential for patient to make measurable gains while on inpatient rehab is excellent 7. Anticipated functional outcomes upon discharge from inpatient rehab are min assist  with PT, min assist with OT, modified independent with SLP. 8. Estimated rehab length of stay to reach the above functional goals is: 17-20 days. 9. Anticipated discharge destination: Home 10. Overall Rehab/Functional Prognosis: good   RECOMMENDATIONS: This patient's condition is appropriate for continued rehabilitative care in the following setting: CIR when medically stable, medical work-up complete, and caregiver support available upon discharge. Patient has agreed to participate in recommended program. Yes Note that insurance prior authorization may be required for reimbursement for recommended care.   Comment: Rehab Admissions Coordinator to follow up.   I have personally performed a face to face diagnostic evaluation, including, but not limited to relevant history and physical exam findings, of this patient and developed relevant assessment and plan.  Additionally, I have reviewed and concur with the physician assistant's documentation above.    Delice Lesch, MD, ABPMR Lavon Paganini Angiulli, PA-C 03/28/2019    Revision History  Routing History

## 2019-04-03 NOTE — PMR Pre-admission (Signed)
PMR Admission Coordinator Pre-Admission Assessment  Patient: Jeremy Sherman is an 69 y.o., male MRN: [id number] DOB: [birth year] Height: 6' 5"  (195.6 cm) Weight: 106.1 kg              Insurance Information HMO:     PPO: yes     PCP:      IPA:      80/20:      OTHER:  PRIMARY: [employer] [place]      Policy#: [id number]      Subscriber: pt CM Name: Merrilyn Puma      Phone#: [phone]     Fax#: [phone] Pre-Cert#: [id number] approved until 04/15/2019      Employer:  Benefits:  Phone #: [phone]     Name: 2/4 Eff. Date: 01/28/2019     Deduct: $3500      Out of Pocket Max: $7000 includes deductible/have met $6881.67      Life Max: none CIR: 70%      SNF: 70% 60 days per year Outpatient: $50 per visit     Co-Pay: 30 visits combined PT and OT; 30 visits SLP Home Health: 70%      Co-Pay: visits limited by medical neccesity DME: 70%     Co-Pay: 30% Providers: in network  SECONDARY: Medicare part A only      Policy#: [id number]      Subscriber: pt Patient's name on his medicare card is listed as "Jeremy Sherman" I had his Medicare added to his account as well as told patient and his wife that they needed to contact Medicare to have his name corrected  Medicaid Application Date:       Case Manager:  Disability Application Date:       Case Worker:   The "Data Collection Information Summary" for patients in Inpatient Rehabilitation Facilities with attached "Privacy Act [place] Records" was provided and verbally reviewed with: Patient and Family  Emergency Contact Information Contact Information    Name Relation Home Work [place], [place] Spouse [phone]       Current Medical History  Patient Admitting Diagnosis: CVA  History of Present Illness: 69 year old right-handed male with history of hypertension, hyperlipidemia, prediabetes, 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  By report patient had episode of dizziness confusion on 03/17/2019.  He went  to his PCP's office became unresponsive hypotensive and diaphoretic.  Blood pressure reportedly lower than 100 he was brought by EMS to [facility].  Patient had recently been hospitalized at [facility] 03/11/2019 to 03/13/2019 for hypertensive urgency.  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  TEE at outside hospital showed left atrial appendage thrombus was placed on intravenous heparin.  He was transferred to [facility] for further evaluation.  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 Oneida Alar 03/26/2019.  Found to have right hemiparesis following procedure.  Follow-up imaging showed early subacute infarct in the right cerebellar peduncle, likely small vessel disease.  Patient did remain intubated through 03/27/2019.  He had been receiving Cleviprex for blood pressure control.  Initially on intravenous heparin transition to Eliquis 5 mg twice daily as well as Brilinta.  Patient with persistent low-grade  fever with urinalysis 03/29/2019 + nitrite placed on Levaquin x5 doses that has been completed blood cultures are pending Currently maintained on a dysphagia #1 thin liquid diet.  Hospital course findings of elevated TSH level 45.808 and placed on Synthroid.   Complete NIHSS TOTAL: 4 Glasgow Coma Scale Score: 15  Past Medical History  Past Medical History:  Diagnosis Date  . CAD (coronary artery disease)   . Carotid stenosis, left    L ICA 50%  . Hyperlipidemia   . Hypertension   . Hypertensive urgency 03/11/2019  . 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 Prediabetes     Family History  family history includes Stroke in his  mother.  Prior Rehab/Hospitalizations:  Has the patient had prior rehab or hospitalizations prior to admission? Yes  Has the patient had major surgery during 100 days prior to admission? Yes  Current Medications   Current Facility-Administered Medications:  .  0.9 %  sodium chloride infusion, 250 mL, Intravenous, Continuous, Kipp Brood, MD, Stopped at 03/26/19 1727 .  apixaban (ELIQUIS) tablet 5 mg, 5 mg, Oral, BID, Brand Males, MD, 5 mg at 04/03/19 0952 .  atorvastatin (LIPITOR) tablet 80 mg, 80 mg, Oral, q1800, Dessa Phi, DO, 80 mg at 04/02/19 1721 .  chlorhexidine gluconate (MEDLINE KIT) (PERIDEX) 0.12 % solution 15 mL, 15 mL, Mouth Rinse, BID, Erick Colace, NP, 15 mL at 04/03/19 0952 .  Chlorhexidine Gluconate Cloth 2 % PADS 6 each, 6 each, Topical, Daily, Oswald Hillock, MD, 6 each at 04/03/19 [phone] .  hydrALAZINE (APRESOLINE) injection 10-40 mg, 10-40 mg, Intravenous, Q4H PRN, Brand Males, MD, 20 mg at 04/02/19 0338 .  insulin aspart (novoLOG) injection 0-9 Units, 0-9 Units, Subcutaneous, TID WC, Dessa Phi, DO, 1 Units at 04/02/19 1258 .  levothyroxine (SYNTHROID) tablet 100 mcg, 100 mcg, Oral, Q0600, Dessa Phi, DO, 100 mcg at 04/03/19 0615 .  pantoprazole (PROTONIX) EC tablet 40 mg, 40 mg, Oral, QHS, Ramaswamy, Murali, MD, 40 mg at 04/02/19 2243 .  sodium chloride flush (NS) 0.9 % injection 10-40 mL, 10-40 mL, Intracatheter, Q12H, Agarwala, Ravi, MD, 10 mL at 04/02/19 2244 .  sodium chloride flush (NS) 0.9 % injection 10-40 mL, 10-40 mL, Intracatheter, PRN, Agarwala, Ravi, MD .  sodium chloride flush (NS) 0.9 % injection 10-40 mL, 10-40 mL, Intracatheter, Q12H, Agarwala, Ravi, MD, 10 mL at 04/03/19 0952 .  sodium chloride flush (NS) 0.9 % injection 10-40 mL, 10-40 mL, Intracatheter, PRN, Agarwala, Ravi, MD, 20 mL at 04/02/19 1504 .  tamsulosin (FLOMAX) capsule 0.4 mg, 0.4 mg, Oral, Daily, Dessa Phi, DO, 0.4 mg at 04/03/19 2500 .  [COMPLETED]  ticagrelor (BRILINTA) tablet 180 mg, 180 mg, Oral, Once, 180 mg at 03/27/19 0857 **FOLLOWED BY** ticagrelor (BRILINTA) tablet 90 mg, 90 mg, Oral, BID, Agarwala, Ravi, MD, 90 mg at 04/03/19 0952 .  traMADol (ULTRAM) tablet 50 mg, 50 mg, Oral, Q6H PRN, Dessa Phi, DO  Patients Current Diet:  Diet Order            DIET DYS 2 Room service appropriate? Yes; Fluid consistency: Thin  Diet effective now              Precautions / Restrictions Precautions Precautions: Fall Precaution Comments: bed rest orders in - spoke with Dr. Chase Caller today, pt appropriate for OOB Restrictions Weight Bearing Restrictions: No   Has the patient had 2 or more falls or a fall with injury in the past year?No  Prior Activity Level Community (  5-7x/wk): Independent and working as  a Dealer ; driving  Prior Functional Level Prior Function Level of Independence: Independent Comments: drives  Self Care: Did the patient need help bathing, dressing, using the toilet or eating?  Independent  Indoor Mobility: Did the patient need assistance with walking from room to room (with or without device)? Independent  Stairs: Did the patient need assistance with internal or external stairs (with or without device)? Independent  Functional Cognition: Did the patient need help planning regular tasks such as shopping or remembering to take medications? Independent  Home Assistive Devices / Equipment Home Assistive Devices/Equipment: Cane (specify quad or straight) Home Equipment: Cane - single point  Prior Device Use: Indicate devices/aids used by the patient prior to current illness, exacerbation or injury? None of the above  Current Functional Level Cognition  Arousal/Alertness: Awake/alert Overall Cognitive Status: Impaired/Different from baseline Current Attention Level: Sustained Orientation Level: Oriented to person, Oriented to place, Oriented to time, Oriented to situation Following Commands: Follows  one step commands consistently, Follows one step commands with increased time Safety/Judgement: Decreased awareness of safety, Decreased awareness of deficits General Comments: pt reports he is in [employer] initially, later in session reports he is at [facility] [employer] [facility], therapists reoriented pt;he continues to require vc to initiate task, problem solving and safety awareness;consistent cues for safe hand placement and safe use of RW;grunting frequently during session but denies pain; Attention: Focused, Sustained Focused Attention: Appears intact Sustained Attention: Appears intact Memory: Impaired Memory Impairment: Decreased short term memory Decreased Short Term Memory: Verbal basic Problem Solving: Impaired Problem Solving Impairment: Verbal complex Executive Function: Reasoning Reasoning: Impaired Reasoning Impairment: Verbal complex    Extremity Assessment (includes Sensation/Coordination)  Upper Extremity Assessment: Generalized weakness  Lower Extremity Assessment: Generalized weakness    ADLs  Overall ADL's : Needs assistance/impaired Eating/Feeding: Set up, Sitting Grooming: Cueing for safety, Minimal assistance, Sitting Grooming Details (indicate cue type and reason): attempted to complete in standing, pt required cue to initiate sitting Upper Body Bathing: Moderate assistance, Sitting Lower Body Bathing: Maximal assistance, Sit to/from stand Upper Body Dressing : Moderate assistance, Sitting Lower Body Dressing: Maximal assistance, Sit to/from stand Toilet Transfer: Moderate assistance, +2 for physical assistance, +2 for safety/equipment, RW Toilet Transfer Details (indicate cue type and reason): simulated, ambulated 23fet with modA+2 to stand, modA+1 for physical assistance with ambulation and +1 for chair follow Toileting- Clothing Manipulation and Hygiene: Moderate assistance, +2 for physical assistance, +2 for safety/equipment, Cueing for safety Toileting -  Clothing Manipulation Details (indicate cue type and reason): maxA for sit<>stand Tub/ Shower Transfer: Min guard, 3 in 1, Ambulation Tub/Shower Transfer Details (indicate cue type and reason): education given on use of 3:1 in tub shower Functional mobility during ADLs: Moderate assistance, +2 for physical assistance, +2 for safety/equipment, Rolling walker General ADL Comments: ambulated to sink with need for chair follow, unable to maintain standing balance at sink level due to reliance on BUE support on external surface    Mobility  Overal bed mobility: Needs Assistance Bed Mobility: Rolling, Sidelying to Sit Rolling: Mod assist Sidelying to sit: Mod assist, +2 for safety/equipment Supine to sit: Min assist Sit to supine: Max assist, +2 for physical assistance, +2 for safety/equipment General bed mobility comments: Pt required assistance to roll to R side push with LLE and reach with LUE for bed rail.  Cues to open eyes to look for railing.  Once in side lying he required assistance to elevate trunk into sitting and advance LEs  to edge of bed.  HOB elevated to improve ease.    Transfers  Overall transfer level: Needs assistance Equipment used: Rolling walker (2 wheeled) Transfers: Sit to/from Stand Sit to Stand: +2 physical assistance, +2 safety/equipment, Mod assist Stand pivot transfers: Min assist General transfer comment: Cues for hand placement as he attempts to pull into standing with hand grips on RW.  Onc e in standing remains with flexed hips.  Pt's RW too short.  Weakness noted on R side.    Ambulation / Gait / Stairs / Wheelchair Mobility  Ambulation/Gait Ambulation/Gait assistance: Mod assist, +2 safety/equipment Gait Distance (Feet): 8 Feet(+ 40 ft) Assistive device: Rolling walker (2 wheeled) Gait Pattern/deviations: Wide base of support, Decreased stride length, Trunk flexed, Step-through pattern, Drifts right/left General Gait Details: Pt noted with drifting to the R  due to weakness and poor safety when using RW.  Cues to keep RW close to his body. Pt fatigue and noted with DOE. Gait velocity: decr Gait velocity interpretation: <1.31 ft/sec, indicative of household ambulator Stairs: Yes Stairs assistance: Min assist Stair Management: One rail Left, Forwards, Alternating pattern Number of Stairs: 5 General stair comments: safe with use of rails    Posture / Balance Dynamic Sitting Balance Sitting balance - Comments: demonstrated preference for single UE support due to dizziness, able to sit without UE support Balance Overall balance assessment: Needs assistance Sitting-balance support: Feet supported, No upper extremity supported Sitting balance-Leahy Scale: Fair Sitting balance - Comments: demonstrated preference for single UE support due to dizziness, able to sit without UE support Standing balance support: Bilateral upper extremity supported, During functional activity Standing balance-Leahy Scale: Poor Standing balance comment: heavy reliance on external support, unable to stand upright without UE support    Special needs/care consideration BiPAP/CPAP CPM Continuous Drip IV Dialysis        Life Vest Oxygen Special Bed Trach Size Wound Vac  Skin                          Bowel mgmt:incontinent Bladder mgmt: Coude 16 FR place 1/30 Diabetic mgmt Hgb A1c 6.4 Behavioral consideration  Chemo/radiation  Designated visitor is wife, Mee Hives   Previous Home Environment  Living Arrangements: Spouse/significant other  Lives With: Spouse Available Help at Discharge: Family, Available 24 hours/day Type of Home: House Home Layout: One level Home Access: Stairs to enter Entrance Stairs-Rails: None Technical brewer of Steps: 4 Bathroom Shower/Tub: Optometrist: Yes How Accessible: Accessible via walker Home Care Services: No Additional Comments: works full time as a Environmental manager for Discharge Living Setting: Patient's home, Lives with (comment)(wife) Type of Home at Discharge: House Discharge Home Layout: One level Discharge Home Access: Stairs to enter Entrance Stairs-Rails: None Entrance Stairs-Number of Steps: 4 Discharge Bathroom Shower/Tub: Tub/shower unit Discharge Bathroom Toilet: Standard Discharge Bathroom Accessibility: Yes How Accessible: Accessible via walker Does the patient have any problems obtaining your medications?: No  Social/Family/Support Systems Patient Roles: Spouse, Parent(employee) Contact Information: wife, Mee Hives Anticipated Caregiver: wife Anticipated Caregiver's Contact Information: [phone] Ability/Limitations of Caregiver: no limitations Caregiver Availability: 24/7 Discharge Plan Discussed with Primary Caregiver: Yes Is Caregiver In Agreement with Plan?: Yes Does Caregiver/Family have Issues with Lodging/Transportation while Pt is in Rehab?: No  Goals/Additional Needs Patient/Family Goal for Rehab: supervision to min with PT and OT, Mod I to supervision with SLP Expected length of stay: ELOS 12 to 17 days Pt/Family Agrees  to Admission and willing to participate: Yes Program Orientation Provided & Reviewed with Pt/Caregiver Including Roles  & Responsibilities: Yes  Decrease burden of Care through IP rehab admission:   Possible need for SNF placement upon discharge:  Patient Condition: This patient's medical and functional status has changed since the consult dated: 03/28/2019 in which the Rehabilitation Physician determined and documented that the patient's condition is appropriate for intensive rehabilitative care in an inpatient rehabilitation facility. See "History of Present Illness" (above) for medical update. Functional changes are: mod assist. Patient's medical and functional status update has been discussed with the Rehabilitation physician and patient remains appropriate  for inpatient rehabilitation. Will admit to inpatient rehab today.  Preadmission Screen Completed By:  Cleatrice Burke, RN, 04/03/2019 11:22 AM ______________________________________________________________________   Discussed status with Dr. Dagoberto Ligas on 04/03/2019 at  1123 and received approval for admission today.  Admission Coordinator:  Cleatrice Burke, time 1654 Date 04/03/2019

## 2019-04-03 NOTE — TOC Transition Note (Signed)
Transition of Care [facility]) - CM/SW Discharge Note   Patient Details  Name: Jeremy Sherman MRN: [id number] Date of Birth: [birth year]  [facility]) CM/SW Contact:  Pollie Friar, RN Phone Number: 04/03/2019, 12:19 PM   Clinical Narrative:    Pt is discharging to CIR today. CM signing off.    Final next level of care: IP Rehab Facility Barriers to Discharge: No Barriers Identified   Patient Goals and CMS Choice   CMS Medicare.gov Compare Post Acute Care list provided to:: Patient Choice offered to / list presented to : Patient  Discharge Placement                       Discharge Plan and Services   Discharge Planning Services: CM Consult Post Acute Care Choice: Home Health, Durable Medical Equipment                               Social Determinants of Health (SDOH) Interventions     Readmission Risk Interventions No flowsheet data found.

## 2019-04-03 NOTE — Progress Notes (Signed)
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 s/p arteriogram which additionally reveals L ACA pericallosal aneurysm. Pt now s/p L common carotid arteriogram with angioplasty of L MCA M1 segment, L ACA pericallosal aneurysm coiling, and L ICA stenting. Extubated 03/27/19.    PT Comments    Pt supine in bed on arrival and appears drowsy.  Performed B LE exercises to warm-up before progressing to functional mobility.  He continues to require moderate assistance for gt and transfers.  Pt continues to be an excellent candidate for aggressive CIR therapies.  Plan to d/c to CIR today.    Follow Up Recommendations  CIR;Supervision/Assistance - 24 hour     Equipment Recommendations  None recommended by PT    Recommendations for Other Services       Precautions / Restrictions Precautions Precautions: Fall Restrictions Weight Bearing Restrictions: No    Mobility  Bed Mobility Overal bed mobility: Needs Assistance Bed Mobility: Rolling;Sidelying to Sit Rolling: Min assist Sidelying to sit: Min assist       General bed mobility comments: Pt required assistance to move LEs to edge of bed and elevate trunk into sitting with HOB elevated.  He performed task in a more fluid motion.  Transfers Overall transfer level: Needs assistance Equipment used: Rolling walker (2 wheeled) Transfers: Sit to/from Stand Sit to Stand: From elevated surface Stand pivot transfers: Mod assist       General transfer comment: Heavy mod assistance to stand from edge of elevated bed and from lower seated recliner in halls.  Cues for hand placement to and from seated surface.  Poor eccentric loading  due to muscle weakness and fatigue.  Ambulation/Gait Ambulation/Gait assistance: Mod assist;+2 safety/equipment(for closec chair follow.) Gait Distance (Feet): 10 Feet(+ 50 ft) Assistive device: Rolling walker (2 wheeled) Gait Pattern/deviations: Wide base of support;Decreased stride length;Trunk flexed;Step-through pattern;Drifts right/left Gait velocity: decr   General Gait Details: Cues for forward gaze and head control.  Close chair follow as he continues to fatigue quickly.  Pt continues to present with DOE.  He required assistance to push RW forward ina straight path with noted weakness pushing R side of RW causing a veer to the R.   Stairs             Wheelchair Mobility    Modified Rankin (Stroke Patients Only) Modified Rankin (Stroke Patients Only) Pre-Morbid Rankin Score: No symptoms Modified Rankin: Moderately severe disability     Balance Overall balance assessment: Needs assistance   Sitting balance-Leahy Scale: Fair       Standing balance-Leahy Scale: Poor                              Cognition Arousal/Alertness: Awake/alert Behavior During Therapy: WFL for tasks assessed/performed Overall Cognitive Status: Impaired/Different from baseline Area of Impairment: Attention;Memory;Following commands;Safety/judgement;Awareness;Problem solving                   Current Attention Level: Sustained Memory: Decreased short-term memory;Decreased recall of precautions Following Commands: Follows one step commands consistently;Follows one step commands with increased time Safety/Judgement: Decreased awareness of safety;Decreased awareness of deficits Awareness: Intellectual Problem Solving: Difficulty sequencing;Requires verbal cues;Slow processing;Requires tactile cues General Comments: He required  increased time to initiate tasks and maintain focus on activity.      Exercises General Exercises - Lower Extremity Ankle Circles/Pumps:  AROM;Both;10 reps;Supine Quad Sets: AROM;Both;10 reps;Supine Long Arc Quad: Both;Seated;10 reps;AROM Heel Slides: Both;10 reps;AAROM;Supine    General Comments        Pertinent Vitals/Pain Pain Assessment: No/denies pain(continues to moan and grimmace but denies pain.  His feet appear painful when donning socks and he has overgrown toenails.)    Home Living                      Prior Function            PT Goals (current goals can now be found in the care plan section) Acute Rehab PT Goals Patient Stated Goal: get stronger Potential to Achieve Goals: Good Progress towards PT goals: Progressing toward goals    Frequency    Min 4X/week      PT Plan Current plan remains appropriate    Co-evaluation              AM-PAC PT "6 Clicks" Mobility   Outcome Measure  Help needed turning from your back to your side while in a flat bed without using bedrails?: A Little Help needed moving from lying on your back to sitting on the side of a flat bed without using bedrails?: A Lot Help needed moving to and from a bed to a chair (including a wheelchair)?: A Lot Help needed standing up from a chair using your arms (e.g., wheelchair or bedside chair)?: A Lot Help needed to walk in hospital room?: A Lot Help needed climbing 3-5 steps with a railing? : A Lot 6 Click Score: 13    End of Session Equipment Utilized During Treatment: Gait belt Activity Tolerance: Patient tolerated treatment well Patient left: with call bell/phone within reach;in chair;with chair alarm set Nurse Communication: Mobility status PT Visit Diagnosis: Other abnormalities of gait and mobility (R26.89);Unsteadiness on feet (R26.81)     Time: [id number] PT Time Calculation (min) (ACUTE ONLY): 21 min  Charges:  $Gait Training: 8-22 mins                     Erasmo Leventhal , PTA Acute Rehabilitation Services Pager [phone] Office [phone]     Dwon Sky Eli Hose 04/03/2019, 3:32 PM

## 2019-04-03 NOTE — Progress Notes (Signed)
Pt admitted to 4W17 approximately 1435. Oriented to floor, call bell, and rehab fall policy. Denies any pain at this time. Pt with increased respiratory effort with exertion. Noted some poor pulses on lower extremities. [name], PA made aware. Continue plan of care.   Gerald Stabs, RN

## 2019-04-04 ENCOUNTER — Inpatient Hospital Stay (HOSPITAL_COMMUNITY): Payer: BC Managed Care – PPO | Admitting: Physical Therapy

## 2019-04-04 ENCOUNTER — Inpatient Hospital Stay (HOSPITAL_COMMUNITY): Payer: BC Managed Care – PPO | Admitting: Speech Pathology

## 2019-04-04 ENCOUNTER — Inpatient Hospital Stay (HOSPITAL_COMMUNITY): Payer: BC Managed Care – PPO | Admitting: Occupational Therapy

## 2019-04-04 DIAGNOSIS — I663 Occlusion and stenosis of cerebellar arteries: Secondary | ICD-10-CM

## 2019-04-04 LAB — CBC WITH DIFFERENTIAL/PLATELET
Abs Immature Granulocytes: 0.05 10*3/uL (ref 0.00–0.07)
Basophils Absolute: 0.1 10*3/uL (ref 0.0–0.1)
Basophils Relative: 1 %
Eosinophils Absolute: 0.2 10*3/uL (ref 0.0–0.5)
Eosinophils Relative: 2 %
HCT: 23.9 % — ABNORMAL LOW (ref 39.0–52.0)
Hemoglobin: 7.8 g/dL — ABNORMAL LOW (ref 13.0–17.0)
Immature Granulocytes: 1 %
Lymphocytes Relative: 12 %
Lymphs Abs: 1.1 10*3/uL (ref 0.7–4.0)
MCH: 30.7 pg (ref 26.0–34.0)
MCHC: 32.6 g/dL (ref 30.0–36.0)
MCV: 94.1 fL (ref 80.0–100.0)
Monocytes Absolute: 0.9 10*3/uL (ref 0.1–1.0)
Monocytes Relative: 11 %
Neutro Abs: 6.5 10*3/uL (ref 1.7–7.7)
Neutrophils Relative %: 73 %
Platelets: 344 10*3/uL (ref 150–400)
RBC: 2.54 MIL/uL — ABNORMAL LOW (ref 4.22–5.81)
RDW: 15 % (ref 11.5–15.5)
WBC: 8.8 10*3/uL (ref 4.0–10.5)
nRBC: 0 % (ref 0.0–0.2)

## 2019-04-04 LAB — COMPREHENSIVE METABOLIC PANEL
ALT: 52 U/L — ABNORMAL HIGH (ref 0–44)
AST: 48 U/L — ABNORMAL HIGH (ref 15–41)
Albumin: 2.3 g/dL — ABNORMAL LOW (ref 3.5–5.0)
Alkaline Phosphatase: 168 U/L — ABNORMAL HIGH (ref 38–126)
Anion gap: 8 (ref 5–15)
BUN: 12 mg/dL (ref 8–23)
CO2: 24 mmol/L (ref 22–32)
Calcium: 8.3 mg/dL — ABNORMAL LOW (ref 8.9–10.3)
Chloride: 103 mmol/L (ref 98–111)
Creatinine, Ser: 1.15 mg/dL (ref 0.61–1.24)
GFR calc Af Amer: >60 mL/min (ref >60)
GFR calc non Af Amer: >60 mL/min (ref >60)
Glucose, Bld: 103 mg/dL — ABNORMAL HIGH (ref 70–99)
Potassium: 3.8 mmol/L (ref 3.5–5.1)
Sodium: 135 mmol/L (ref 135–145)
Total Bilirubin: 1.1 mg/dL (ref 0.3–1.2)
Total Protein: 6.3 g/dL — ABNORMAL LOW (ref 6.5–8.1)

## 2019-04-04 LAB — GLUCOSE, CAPILLARY
Glucose-Capillary: 109 mg/dL — ABNORMAL HIGH (ref 70–99)
Glucose-Capillary: 83 mg/dL (ref 70–99)
Glucose-Capillary: 117 mg/dL — ABNORMAL HIGH (ref 70–99)
Glucose-Capillary: 102 mg/dL — ABNORMAL HIGH (ref 70–99)

## 2019-04-04 MED ORDER — CHLORHEXIDINE GLUCONATE CLOTH 2 % EX PADS
6.0000 | MEDICATED_PAD | Freq: Every day | CUTANEOUS | Status: DC
  Administered 2019-04-04 – 2019-04-06 (×3): 6 via TOPICAL

## 2019-04-04 NOTE — Progress Notes (Signed)
Patient information reviewed and entered into [facility] System by Becky Ioana Louks, PPS coordinator. Information including medical coding, function ability, and quality indicators will be reviewed and updated through discharge.   

## 2019-04-04 NOTE — Discharge Instructions (Addendum)
Inpatient Rehab Discharge Instructions  [place] Discharge date and time: No discharge date for patient encounter.   Activities/Precautions/ Functional Status: Activity: activity as tolerated Diet:  Wound Care: keep wound clean and dry Functional status:  ___ No restrictions     ___ Walk up steps independently ___ 24/7 supervision/assistance   ___ Walk up steps with assistance ___ Intermittent supervision/assistance  ___ Bathe/dress independently ___ Walk with walker     _x__ Bathe/dress with assistance ___ Walk Independently    ___ Shower independently ___ Walk with assistance    ___ Shower with assistance ___ No alcohol     ___ Return to work/school ________  COMMUNITY REFERRALS UPON DISCHARGE:  Home Health: PT, OT, ST, RN Agency: [facility] Equipment/Items Ordered:Rolling walker, 3 n 1, tub transfer bench, standard wheelchair Agency/Supplier:[employer]  Special Instructions: No driving smoking or alcohol  Ambulatory referral obtained for urology services for urinary retention.  Ambulatory referral obtained for podiatry services for debridement of toenails  Follow-up with PCP for follow-up thyroid panel   STROKE/TIA DISCHARGE INSTRUCTIONS SMOKING Cigarette smoking nearly doubles your risk of having a stroke & is the single most alterable risk factor  If you smoke or have smoked in the last 12 months, you are advised to quit smoking for your health.  Most of the excess cardiovascular risk related to smoking disappears within a year of stopping.  Ask you doctor about anti-smoking medications  [place] Quit Line: 1-800-QUIT NOW  Free Smoking Cessation Classes [phone]  CHOLESTEROL Know your levels; limit fat & cholesterol in your diet  Lipid Panel     Component Value Date/Time   CHOL 243 (H) 03/21/2019 0413   TRIG 186 (H) 03/28/2019 0633   HDL 37 (L) 03/21/2019 0413   CHOLHDL 6.6 03/21/2019 0413   VLDL 29 03/21/2019  0413   LDLCALC 177 (H) 03/21/2019 0413      Many patients benefit from treatment even if their cholesterol is at goal.  Goal: Total Cholesterol (CHOL) less than 160  Goal:  Triglycerides (TRIG) less than 150  Goal:  HDL greater than 40  Goal:  LDL (LDLCALC) less than 100   BLOOD PRESSURE American Stroke Association blood pressure target is less that 120/80 mm/Hg  Your discharge blood pressure is:  BP: (!) 143/74  Monitor your blood pressure  Limit your salt and alcohol intake  Many individuals will require more than one medication for high blood pressure  DIABETES (A1c is a blood sugar average for last 3 months) Goal HGBA1c is under 7% (HBGA1c is blood sugar average for last 3 months)  Diabetes:   Lab Results  Component Value Date   HGBA1C 6.4 (H) 03/21/2019     Your HGBA1c can be lowered with medications, healthy diet, and exercise.  Check your blood sugar as directed by your physician  Call your physician if you experience unexplained or low blood sugars.  PHYSICAL ACTIVITY/REHABILITATION Goal is 30 minutes at least 4 days per week  Activity: Increase activity slowly, Therapies: Physical Therapy: Home Health Return to work:   Activity decreases your risk of heart attack and stroke and makes your heart stronger.  It helps control your weight and blood pressure; helps you relax and can improve your mood.  Participate in a regular exercise program.  Talk with your doctor about the best form of exercise for you (dancing, walking, swimming, cycling).  DIET/WEIGHT Goal is to maintain a healthy weight  Your discharge diet is:  Diet Order            DIET DYS 2 Room service appropriate? Yes; Fluid consistency: Thin  Diet effective now              liquids Your height is:  Height: 6\' 5"  (195.6 cm) Your current weight is: Weight: 110.6 kg Your Body Mass Index (BMI) is:  BMI (Calculated): 28.91  Following the type of diet specifically designed for you will help prevent  another stroke.  Your goal weight range is:    Your goal Body Mass Index (BMI) is 19-24.  Healthy food habits can help reduce 3 risk factors for stroke:  High cholesterol, hypertension, and excess weight.  RESOURCES Stroke/Support Group:  Call [phone]   STROKE EDUCATION PROVIDED/REVIEWED AND GIVEN TO PATIENT Stroke warning signs and symptoms How to activate emergency medical system (call 911). Medications prescribed at discharge. Need for follow-up after discharge. Personal risk factors for stroke. Pneumonia vaccine given:  Flu vaccine given:  My questions have been answered, the writing is legible, and I understand these instructions.  I will adhere to these goals & educational materials that have been provided to me after my discharge from the hospital.      My questions have been answered and I understand these instructions. I will adhere to these goals and the provided educational materials after my discharge from the hospital.  Patient/Caregiver Signature _______________________________ Date __________  Clinician Signature _______________________________________ Date __________  Please bring this form and your medication list with you to all your follow-up doctor's appointments.  ================================================================== Information on my medicine - ELIQUIS (apixaban)  This medication education was reviewed with me or my healthcare representative as part of my discharge preparation.   Why was Eliquis prescribed for you? Eliquis was prescribed to treat blood clots that may have been found in the veins of your legs (deep vein thrombosis) or in your lungs (pulmonary embolism) and to reduce the risk of them occurring again.  What do You need to know about Eliquis ? The dose is  ONE 5 mg tablet taken TWICE daily.  Eliquis may be taken with or without food.   Try to take the dose about the same time in the morning and in the evening. If you  have difficulty swallowing the tablet whole please discuss with your pharmacist how to take the medication safely.  Take Eliquis exactly as prescribed and DO NOT stop taking Eliquis without talking to the doctor who prescribed the medication.  Stopping may increase your risk of developing a new blood clot.  Refill your prescription before you run out.  After discharge, you should have regular check-up appointments with your healthcare provider that is prescribing your Eliquis.    What do you do if you miss a dose? If a dose of ELIQUIS is not taken at the scheduled time, take it as soon as possible on the same day and twice-daily administration should be resumed. The dose should not be doubled to make up for a missed dose.  Important Safety Information A possible side effect of Eliquis is bleeding. You should call your healthcare provider right away if you experience any of the following: ? Bleeding from an injury or your nose that does not stop. ? Unusual colored urine (red or dark brown) or unusual colored stools (red or black). ? Unusual bruising for unknown reasons. ? A serious fall or if you hit your head (even if there is no bleeding).  Some medicines may interact with  Eliquis and might increase your risk of bleeding or clotting while on Eliquis. To help avoid this, consult your healthcare provider or pharmacist prior to using any new prescription or non-prescription medications, including herbals, vitamins, non-steroidal anti-inflammatory drugs (NSAIDs) and supplements.  This website has more information on Eliquis (apixaban): [email or web address]://[email or web address].[email or web address]/home

## 2019-04-04 NOTE — Evaluation (Signed)
Physical Therapy Assessment and Plan  Patient Details  Name: Jeremy Sherman MRN: [id number] Date of Birth: December 10, 1950  PT Diagnosis: Abnormal posture, Abnormality of gait, Cognitive deficits, Difficulty walking, Edema, Impaired cognition, Impaired sensation, Muscle weakness and Pain in feet Rehab Potential: Good ELOS: ~3 weeks   Today's Date: 04/04/2019 PT Individual Time: 1305-1400 PT Individual Time Calculation (min): 55 min    Problem List:  Patient Active Problem List   Diagnosis Date Noted  . 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 AKI (acute kidney injury) ([place])   . Aneurysm of anterior cerebral artery   . Essential hypertension   . Prediabetes   . Dysphagia, post-stroke   . Thrombus of left atrial appendage   . 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 Carotid stenosis 03/21/2019  . CAD (coronary artery disease) 03/21/2019  . Brain aneurysm 03/21/2019  . Stroke [facility]) 03/20/2019    Past Medical History:  Past Medical History:  Diagnosis Date  . CAD (coronary artery disease)   . Carotid stenosis, left    L ICA 50%  . Hyperlipidemia   . Hypertension   . Hypertensive urgency 03/11/2019  . 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 Prediabetes    Past Surgical History:  Past Surgical History:  Procedure Laterality Date  . CORONARY ARTERY BYPASS GRAFT  07/02/2007   [place]-> LAD SVG-> PD branch of RCA [name]) at [place] Left 03/26/2019   Procedure: EXPOSURE OF LEFT COMMON CAROTID ARTERY AND PLACEMENT OF SHEATH;  Surgeon: Marty Heck, MD;  Location: [facility];  Service: Vascular;  Laterality: Left;  . ENDARTERECTOMY Left 03/26/2019   Procedure: REMOVAL OF CAROTID SHEATH AND CLOSURE OF  CAROTID ARTERY;  Surgeon: Serafina Mitchell, MD;  Location: [facility] OR;  Service: Vascular;  Laterality: Left;  . IR ANGIO INTRA EXTRACRAN SEL COM CAROTID INNOMINATE BILAT MOD SED  03/21/2019  . IR ANGIO INTRA EXTRACRAN  SEL INTERNAL CAROTID UNI L MOD SED  03/26/2019  . IR ANGIO VERTEBRAL SEL SUBCLAVIAN INNOMINATE BILAT MOD SED  03/21/2019  . IR ANGIOGRAM FOLLOW UP STUDY  03/26/2019  . IR ANGIOGRAM FOLLOW UP STUDY  03/26/2019  . IR ANGIOGRAM FOLLOW UP STUDY  03/26/2019  . IR ANGIOGRAM FOLLOW UP STUDY  03/26/2019  . IR ANGIOGRAM FOLLOW UP STUDY  03/26/2019  . IR ANGIOGRAM FOLLOW UP STUDY  03/26/2019  . IR CT HEAD LTD  03/26/2019  . IR INTRA CRAN STENT  03/26/2019  . IR INTRAVSC STENT CERV CAROTID W/O EMB-PROT MOD SED INC ANGIO  03/26/2019  . IR NEURO EACH ADD'L AFTER BASIC UNI LEFT ([facility])  03/26/2019  . IR TRANSCATH/EMBOLIZ  03/26/2019    Assessment & Plan Clinical Impression: Patient is a 69 y.o. year old right-handed male with history of hypertension, hyperlipidemia, prediabetes, 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  Per chart review patient lives with spouse and daughter.  1 level home 4 steps to entry.  Works full-time as a Glass blower/designer.  By report patient had episode of dizziness confusion on 03/17/2019.  He went to his PCPs office became unresponsive hypotensive and diaphoretic.  Blood pressure reportedly lower than 100 he was brought by EMS to [facility].  Patient had recently been hospitalized at [facility] 03/11/2019 to 03/13/2019 for hypertensive urgency.  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  TEE at outside hospital showed left atrial appendage thrombus was placed on intravenous heparin.  He was transferred to [facility] for further evaluation.  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 Oneida Alar 03/26/2019.  Found to have right hemiparesis following  procedure.  Follow-up imaging showed early subacute infarct in the right cerebellar peduncle, likely small vessel disease.  Patient did remain intubated through 03/27/2019.  He had been receiving Cleviprex for blood pressure control.  Initially on intravenous heparin transition to Eliquis 5 mg twice daily as well as Brilinta.  Patient with persistent low-grade fever with urinalysis 03/29/2019 + nitrite placed on Levaquin x5 doses that has been completed blood cultures are pending Currently maintained on a dysphagia #1 thin liquid diet.  Hospital course findings of elevated TSH level 45.808 and placed on Synthroid.  Therapy evaluations completed and patient was admitted for a comprehensive rehab program. Patient transferred to CIR on 04/03/2019 .   Patient currently requires max with mobility secondary to muscle weakness and muscle joint tightness, decreased cardiorespiratoy endurance, unbalanced muscle activation and decreased motor planning, decreased attention to right, decreased awareness, decreased problem solving, decreased memory and delayed processing and decreased sitting balance, decreased standing balance, decreased postural control and decreased balance strategies.  Prior to hospitalization, patient was independent  with mobility and lived with Spouse, Daughter(pt's daughter lives in [place], [place] and comes to visit for weeks at a time) in a House home.  Home access is 4 in back and 2 in frontStairs to enter.  Patient will benefit from skilled PT intervention to maximize safe functional mobility, minimize fall risk and decrease caregiver burden for planned discharge home with 24 hour supervision.  Anticipate patient will benefit from follow up [place] at discharge.  PT - End of Session Activity Tolerance: Tolerates 30+ min activity with multiple rests Endurance Deficit: Yes Endurance Deficit Description: limited OOB activity due to vitals and fatigue PT Assessment Rehab Potential (ACUTE/IP ONLY): Good PT  Barriers to Discharge: [place] home environment;Home environment access/layout;Lack of/limited family support PT Patient demonstrates impairments in the following area(s): Balance;Edema;Safety;Sensory;Endurance;Skin Integrity;Motor;Nutrition;Pain;Perception PT Transfers Functional Problem(s): Bed Mobility;Bed to Chair;Car;Furniture PT Locomotion Functional Problem(s): Ambulation;Wheelchair Mobility;Stairs PT Plan PT Intensity: Minimum of 1-2 x/day ,45 to 90 minutes PT Frequency: 5 out of 7 days PT Duration Estimated Length of Stay: ~3 weeks PT Treatment/Interventions: Ambulation/gait training;Community reintegration;DME/adaptive equipment instruction;Neuromuscular re-education;Psychosocial support;Stair training;UE/LE Strength taining/ROM;Wheelchair propulsion/positioning;Balance/vestibular training;Discharge planning;Functional electrical stimulation;Pain management;Skin care/wound management;Therapeutic Activities;UE/LE Coordination activities;Cognitive remediation/compensation;Disease management/prevention;Functional mobility training;Patient/family education;Splinting/orthotics;Therapeutic Exercise;Visual/perceptual remediation/compensation PT Transfers Anticipated Outcome(s): supervision using LRAD PT Locomotion Anticipated Outcome(s): CGA using LRAD PT Recommendation Follow Up Recommendations: Home health PT;24 hour supervision/assistance Patient destination: Home Equipment Recommended: To be determined  Skilled Therapeutic Intervention Evaluation completed (see details above and below) with education on PT POC and goals and individual treatment initiated with focus on bed mobility, transfers, activity tolerance, upright sitting, and education regarding daily therapy schedule, weekly team meetings, purpose of PT evaluation, and other CIR information. Pt received supine in bed, asleep but able to be aroused with increased and constant stimulus. Of note, pt often starts breathing heavy even  after very little mobility such as 1 LE manual muscle test while in supine as well as frequently grunting/moaning with movements. Therapist retrieved different w/c with higher seat height  to improve mobility due to pt's tall height, a w/c cushion, and elevating leg rests all for improved upright OOB activity tolerance. Assessed vitals in supine: BP 159/71 (MAP 96), HR 74bpm Performed supine>sit, HOB slightly elevated and relying on bedrails, with min assist and max multimodal cuing for sequencing to increase pt independence. Reassessed vitals in sitting: BP 125/69 (MAP 85), HR 91bpm and pt denies symptoms. Sit>stand attempted from slightly elevated EOB>RW but despite max assist unable to come to standing therefore therapist raised bed a little more and able to come to stand with max assist and max multimodal cuing for proper UE placement for safety with AD. L stand pivot to w/c using RW with mod assist for balance and AD management with max multimodal cuing for proper use of RW and sequencing of LE stepping. Reassessed vitals: BP 114/77 (MAP 85), HR 89bpm and pt denies symptoms. Transported pt to therapy gym and educated him on rehab. Reassessed vitals in sitting prior to initiating gait: BP 110/58 (MAP 72), HR 83bpm with pt starting to report fatigue. Due to his BP remaining low, inability to don TED hose due to lower leg skin integrity, and no +2 assistance available for safety deferred ambulation at this time. Transported back to room and notified RN of pt's vitals. R stand pivot w/c>EOB using RW with pt able to come to stand with min assist this trial and transfer with min assist for balance and AD management - again with max multimodal cuing for sequencing of LE stepping and AD. Sit>supine with min assist for B LE management and pt again relying on bedrails for trunk support. Reassessed vital: BP 131/66 (MAP 85), HR 76bpm Pt had not eaten lunch so therapist set-up pt with meal tray and notified RN so she could  provide intermittent assist. Left with needs in reach, lines intact, and bed alarm on.   PT Evaluation Precautions/Restrictions Precautions Precautions: Fall Restrictions Weight Bearing Restrictions: No Pain Initially denies pain but upon initiating movement states "it hurts all over" but then states "I can work through it" - therapist provided rest breaks and repositioning for pain management. Home Living/Prior Functioning Home Living Available Help at Discharge: Family;Available 24 hours/day Type of Home: House Home Access: Stairs to enter [employer] of Steps: 4 in back and 2 in front Entrance Stairs-Rails: None Home Layout: One level Additional Comments: works full time as a Glass blower/designer  Lives With: Spouse;Daughter(pt's daughter lives in [place], [place] and comes to visit for weeks at a time) Prior Function Level of Independence: Independent with gait;Independent with transfers  Able to Take Stairs?: Yes Driving: Yes Vocation: Full time employment Vocation Requirements: Warehouse manager Perception: Impaired Inattention/Neglect: Other (comment)(difficult to assess but possible R inattention) Praxis Praxis: Impaired Praxis Impairment Details: Motor planning  Cognition Overall Cognitive Status: Impaired/Different from baseline Arousal/Alertness: Awake/alert Orientation Level: Oriented to person;Oriented to place;Oriented to situation;Disoriented to time Attention: Focused;Sustained Focused Attention: Appears intact Sustained Attention: Appears intact Memory: Impaired Awareness: Impaired Problem Solving: Impaired Safety/Judgment: Impaired Sensation Sensation Light Touch: Impaired Detail Light Touch Impaired Details: Impaired RLE(difficult to assess but appears decreased touch in R LE) Hot/Cold: Not tested Proprioception: Impaired by gross assessment Stereognosis: Not tested Coordination Gross Motor Movements are Fluid and  Coordinated: No Coordination and Movement Description: impaired due to generalized weakness`and poor motor planning Heel Shin Test: impaired bilaterally due to weakness and generalized pain Motor  Motor Motor: Abnormal postural alignment and control Motor - Skilled Clinical Observations: generalized weakness  Mobility Bed Mobility Bed Mobility: Supine to Sit;Sit to Supine Supine to Sit: Minimal Assistance - Patient > 75%;Moderate Assistance - Patient 50-74% Sit to Supine: Minimal Assistance - Patient > 75%;Moderate Assistance - Patient 50-74% Transfers Transfers: Sit to Stand;Stand to Sit;Stand Pivot Transfers Sit to Stand: Maximal Assistance - Patient 25-49%;Moderate Assistance - Patient 50-74% Stand to Sit: Moderate Assistance - Patient 50-74% Stand Pivot Transfers: Moderate Assistance - Patient 50 - 74% Stand Pivot Transfer Details: Tactile cues for sequencing;Tactile cues for initiation;Tactile cues for weight shifting;Tactile cues for posture;Verbal cues for sequencing;Visual cues/gestures for sequencing;Verbal cues for technique;Verbal cues for precautions/safety;Verbal cues for safe use of DME/AE;Verbal cues for gait pattern;Manual facilitation for weight shifting;Manual facilitation for placement Transfer (Assistive device): Rolling walker Locomotion  Gait Ambulation: No Gait Gait: No Stairs / Additional Locomotion Stairs: No Wheelchair Mobility Wheelchair Mobility: No  Trunk/Postural Assessment  Cervical Assessment Cervical Assessment: Within Functional Limits Thoracic Assessment Thoracic Assessment: Exceptions to WFL(rounded shoulders) Lumbar Assessment Lumbar Assessment: Exceptions to WFL(posterior pelvic tilt in sitting) Postural Control Postural Control: Deficits on evaluation Postural Limitations: decreased in standing with pt requiring assist and AD for balance  Balance Balance Balance Assessed: Yes Static Sitting Balance Static Sitting - Balance Support: Feet  supported;Bilateral upper extremity supported Static Sitting - Level of Assistance: 5: Stand by assistance Dynamic Sitting Balance Dynamic Sitting - Balance Support: Feet supported Dynamic Sitting - Level of Assistance: 4: Min assist;3: Mod assist Static Standing Balance Static Standing - Balance Support: During functional activity;Bilateral upper extremity supported Static Standing - Level of Assistance: 4: Min assist;3: Mod assist Dynamic Standing Balance Dynamic Standing - Balance Support: During functional activity;Bilateral upper extremity supported Dynamic Standing - Level of Assistance: 3: Mod assist Extremity Assessment      RLE Assessment RLE Assessment: Exceptions to [facility] Active Range of Motion (AROM) Comments: decreased ankle DF ROM - only able to get to neutral RLE Strength Right Hip Flexion: 3-/5 Right Knee Flexion: 3+/5 Right Knee Extension: 3+/5 Right Ankle Dorsiflexion: 3/5(through available range) Right Ankle Plantar Flexion: 3+/5 LLE Assessment LLE Assessment: Exceptions to [facility] LLE Strength Left Hip Flexion: 2+/5 Left Knee Flexion: 3+/5 Left Knee Extension: 3+/5 Left Ankle Dorsiflexion: 3/5(through available range) Left Ankle Plantar Flexion: 3/5    Refer to Care Plan for Long Term Goals  Recommendations for other services: None   Discharge Criteria: Patient will be discharged from PT if patient refuses treatment 3 consecutive times without medical reason, if treatment goals not met, if there is a change in medical status, if patient makes no progress towards goals or if patient is discharged from hospital.  The above assessment, treatment plan, treatment alternatives and goals were discussed and mutually agreed upon: by patient  Tawana Scale, PT, DPT 04/04/2019, 7:59 AM

## 2019-04-04 NOTE — Care Management (Signed)
Inpatient [place] Individual Statement of Services  Patient Name:  Jeremy Sherman  Date:  04/04/2019  Welcome to the [facility].  Our goal is to provide you with an individualized program based on your diagnosis and situation, designed to meet your specific needs.  With this comprehensive rehabilitation program, you will be expected to participate in at least 3 hours of rehabilitation therapies Monday-Friday, with modified therapy programming on the weekends.  Your rehabilitation program will include the following services:  Physical Therapy (PT), Occupational Therapy (OT), Speech Therapy (ST), 24 hour per day rehabilitation nursing, Therapeutic Recreaction (TR), Neuropsychology, Case Management (Social Worker), Rehabilitation Medicine, Nutrition Services and Pharmacy Services  Weekly team conferences will be held on Wednesdays to discuss your progress.  Your Social Worker will talk with you frequently to get your input and to update you on team discussions.  Team conferences with you and your family in attendance may also be held.  Expected length of stay: 2-2.5 weeks  Overall anticipated outcome: Supervision- Modified Independent goals  Depending on your progress and recovery, your program may change. Your Social Worker will coordinate services and will keep you informed of any changes. Your Social Worker's name and contact numbers are listed  below.  The following services may also be recommended but are not provided by the [facility] will be made to provide these services after discharge if needed.  Arrangements include referral to agencies that provide these services.  Your insurance has been verified to be: Medicare part A/ BCBS PPO Your primary doctor is:  Dr. Heide Scales  Pertinent information will be shared with your doctor and your insurance company.  Social Worker: : Lennart Pall, [place] or ([phone]  Care manager: Dorien Chihuahua, RN [phone] or ([phone]  Information discussed with and copy given to patient by: Margarito Liner, 04/04/2019, 1:34 PM

## 2019-04-04 NOTE — H&P (Signed)
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 Per chart review patient lives with spouse and daughter. 1 level home 4 steps to entry. Works full-time as a Glass blower/designer. By report patient had episode of dizziness confusion on 03/17/2019. He went to his PCPs office became unresponsive hypotensive and diaphoretic. Blood pressure reportedly lower than 100 he was brought by EMS to [facility]. Patient had recently been hospitalized at [facility] 03/11/2019 to 03/13/2019 for hypertensive urgency. 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 TEE at outside hospital showed left atrial appendage thrombus was placed on intravenous heparin. He was transferred to [facility] for further evaluation. 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 Oneida Alar 03/26/2019. Found to have right hemiparesis following procedure. Follow-up imaging showed early subacute infarct in the right cerebellar peduncle, likely small vessel disease. Patient did remain intubated through 03/27/2019. He had been receiving Cleviprex for blood pressure control. Initially on intravenous heparin transition to Eliquis 5 mg twice daily as well as Brilinta. Patient with persistent low-grade fever with urinalysis 03/29/2019 + nitrite placed on Levaquin x5 doses that has been completed blood cultures are pending Currently maintained on a dysphagia #1 thin liquid diet. Hospital course findings of elevated TSH level 45.808 and placed on Synthroid.  Therapy evaluations completed and patient was admitted for a comprehensive rehab program  Pt reports he's voiding OK_ said his last BM was probably yesterday- denies feelings of constipation. Is now on D#2 diet with thin liquids.  Although chart says R foot ischemia is asymptomatic, pt c/o pain in RLE from foot to above ankle that's diffuse, not point tender.  C/o being sleepy- and TSH was 45! Fits the medical picture.  Review of Systems  Constitutional: Positive for fever and malaise/fatigue.  HENT: Negative for hearing loss.  Eyes: Negative for blurred vision and double vision.  Respiratory: Negative for cough and shortness of breath.  Cardiovascular: Positive for leg swelling. Negative for chest pain and palpitations.  Gastrointestinal: Positive for constipation. Negative for heartburn, nausea and vomiting.  Genitourinary: Positive for urgency. Negative for dysuria, flank pain and hematuria.  Musculoskeletal: Positive for joint pain and myalgias.  Skin: Negative for rash.  Neurological: Positive for weakness.  All other systems reviewed and are negative.       Past Medical History:  Diagnosis Date  . CAD (coronary artery disease)   . Carotid stenosis, left    L ICA 50%  . Hyperlipidemia   . Hypertension   . Hypertensive urgency 03/11/2019  . 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 Prediabetes         Past Surgical History:  Procedure Laterality Date  . CORONARY ARTERY BYPASS GRAFT  07/02/2007   [place]-> LAD SVG-> PD branch of RCA [name]) at [place] Left 03/26/2019   Procedure: EXPOSURE OF LEFT COMMON CAROTID ARTERY AND PLACEMENT OF SHEATH; Surgeon: Marty Heck, MD; Location: [facility]; Service: Vascular; Laterality: Left;  . ENDARTERECTOMY Left 03/26/2019   Procedure: REMOVAL OF CAROTID SHEATH AND CLOSURE  OF CAROTID ARTERY; Surgeon: Serafina Mitchell, MD; Location: [facility] OR; Service: Vascular; Laterality: Left;  . IR ANGIO INTRA  EXTRACRAN SEL COM CAROTID INNOMINATE BILAT MOD SED  03/21/2019  . IR ANGIO VERTEBRAL SEL SUBCLAVIAN INNOMINATE BILAT MOD SED  03/21/2019        Family History  Problem Relation Age of Onset  . Stroke Mother    Social History: reports that he has been smoking cigarettes. He started smoking about 45 years ago. He has a 22.50 pack-year smoking history. He has never used smokeless tobacco. He reports previous alcohol use. No history on file for drug. lives with wife who is in good health in [place], [place] in 2 story house 2-4 STE  Allergies: No Known Allergies        Medications Prior to Admission  Medication Sig Dispense Refill  . aspirin EC 81 MG tablet Take 81 mg by mouth daily.    Marland Kitchen omega-3 acid ethyl esters (LOVAZA) 1 g capsule Take 1 g by mouth daily.     Drug Regimen Review  Drug regimen was reviewed and remains appropriate with no significant issues identified  Home:  Home Living  Family/patient expects to be discharged to:: Private residence  Living Arrangements: Spouse/significant other  Available Help at Discharge: Family, Available 24 hours/day  Type of Home: House  Home Access: Stairs to enter  Technical brewer of Steps: 4  Entrance Stairs-Rails: None  Home Layout: One level  Bathroom Shower/Tub: Administrator, Civil Service: Standard  Home Equipment: [employer] - single point  Additional Comments: works full time as a Geophysical data processor History:  Prior Function  Level of Independence: Independent  Comments: drives  Functional Status:  Mobility:  Bed Mobility  Overal bed mobility: Needs Assistance  Bed Mobility: Supine to Sit  Supine to sit: Min assist  Sit to supine: Max assist, +2 for physical assistance, +2 for safety/equipment  General bed mobility comments: pt attempted to sit straight up in bed and was unable, vc's for rolling over and pt was able to complete task and move LE's off bed. Min HHA for elevation of trunk into sitting and vc's needed to  scoot to EOB. Pt needed redirecting to this task several times due to decreased attention  Transfers  Overall transfer level: Needs assistance  Equipment used: Rolling walker (2 wheeled)  Transfers: Sit to/from Stand  Sit to Stand: +2 physical assistance, +2 safety/equipment, Mod assist  Stand pivot transfers: Min assist  General transfer comment: max A +2 for first sit<>stand from bed but mod A +2 for for 3 subsequent sit<>stand from recliner. Cues for full extension of trunk as pt tends to keep wt posterior in heels and trunk flexed at hips.  Ambulation/Gait  Ambulation/Gait assistance: Mod assist, +2 safety/equipment  Gait Distance (Feet): 7 Feet  Assistive device: Rolling walker (2 wheeled)  Gait Pattern/deviations: Wide base of support, Decreased stride length, Trunk flexed, Step-to pattern  General Gait Details: pt with very short step length and kept trying to push RW further fwd without moving feet. Mod A and mod vc's for safe use of RW and each step.  Gait velocity: decr  Gait velocity interpretation: <1.31 ft/sec, indicative of household ambulator  Stairs: Yes  Stairs assistance: Min assist  Stair Management: One rail Left, Forwards, Alternating pattern  Number of Stairs: 5  General stair comments: safe with use of rails   ADL:  ADL  Overall ADL's : Needs assistance/impaired  Eating/Feeding: Set up, Sitting  Grooming: Sitting, Moderate assistance, Standing, Min guard  Grooming Details (indicate cue type and reason): pt prepped toothbrush while sitting in recliner, brushed teeth while standing at the sink with modA for stability in standing  Upper Body Bathing: Moderate assistance, Sitting  Lower Body Bathing: Maximal assistance, Sit to/from stand  Upper Body Dressing : Moderate assistance, Sitting  Lower Body Dressing: Maximal assistance, Sit to/from stand  Toilet Transfer: Maximal assistance, Moderate assistance, +2 for physical assistance, +2 for safety/equipment, RW,  Ambulation  Toilet Transfer Details (indicate cue type and reason): ambulated to sink with maxA+2 for sit<>stand and modA for ambulation at RW level with assist for chair follow  Toileting- Clothing Manipulation and Hygiene: Maximal assistance, +2 for physical assistance, +2 for safety/equipment  Toileting - Clothing Manipulation Details (indicate cue type and reason): maxA for sit<>stand  Tub/ Shower Transfer: Min guard, 3 in 1, Ambulation  Tub/Shower Transfer Details (indicate cue type and reason): education given on use of 3:1 in tub shower  Functional mobility during ADLs: Moderate assistance, Maximal assistance, +2 for physical assistance, +2 for safety/equipment, Rolling walker, Cueing for safety, Cueing for sequencing  General ADL Comments: ambulated from EOB to sink with need for chair follow;pt uanble to progress into full upright posture even at sink level with use of mirror for visual assistance and tactile input from therapists  Cognition:  Cognition  Overall Cognitive Status: Impaired/Different from baseline  Arousal/Alertness: Awake/alert  Orientation Level: Oriented to person, Oriented to place, Oriented to situation  Attention: Focused, Sustained  Focused Attention: Appears intact  Sustained Attention: Appears intact  Memory: Impaired  Memory Impairment: Decreased short term memory  Decreased Short Term Memory: Verbal basic  Problem Solving: Impaired  Problem Solving Impairment: Verbal complex  Executive Function: Reasoning  Reasoning: Impaired  Reasoning Impairment: Verbal complex  Cognition  Arousal/Alertness: Lethargic  Behavior During Therapy: Flat affect  Overall Cognitive Status: Impaired/Different from baseline  Area of Impairment: Safety/judgement, Problem solving, Awareness, Following commands, Orientation, Memory, Attention  Orientation Level: Time  Current Attention Level: Sustained  Memory: Decreased short-term memory, Decreased recall of precautions    Following Commands: Follows one step commands consistently, Follows one step commands with increased time  Safety/Judgement: Decreased awareness of safety  Awareness: Intellectual  Problem Solving: Difficulty sequencing, Requires verbal cues, Slow processing, Requires tactile cues  General Comments: pt with delayed processing and thought it was 1991 but once alert was appropriate throughout session even responding to humor;consistent cues for safety;appears to have decreased awareness of deficits  Physical Exam:  Blood pressure (!) 162/95, pulse 74, temperature 98.2 F (36.8 C), temperature source Oral, resp. rate 18, height 6\' 5"  (1.956 m), weight 106.1 kg, SpO2 99 %.  Physical Exam  Nursing note and vitals reviewed.  Constitutional: He appears well-developed and well-nourished.  Large man who's 6 ft 5 inches; sitting in bedside chair; wearing mask on forehead- asked for help to fix it, at 1pm- lunch was not in front of pt- was watching TV, NAD  HENT:  Head: Normocephalic and atraumatic.  Nose: Nose normal.  Mouth/Throat: No oropharyngeal exudate.  Face symmetrical- no facial droop; tongue midline; face sensation intact  Eyes: Pupils are equal, round, and reactive to light. Conjunctivae are normal. Right eye exhibits no discharge. Left eye exhibits no discharge.  EOMI B/L- L horizontal nystagmus with EOM to L only;  Neck: No tracheal deviation present.  Left neck incision clean and dry  Cardiovascular:  RRR  Respiratory:  CTA B/L- no w/r/r  GI:  Protuberant; soft, NT, ND, (+)hyperactive BS  Musculoskeletal:  Cervical back: Normal range of motion and neck supple.  Comments: RUE- bicep 4/5, tricep 4+/5, WE 4+/5, grip 4/5, finger abd 4+/5  LUE 5-/5 in all same muscles tested  RLE- HF 2+/5, KE and KF 3-/5, DF and PF 3+/5 LLE- HF 2/5, KE and KF 3-/5, DF and PF 3/5  C/o a lot of pain with ROM of R and L legs.  Neurological:  Patient is alert and responds to verbal stimuli however  he prefers to keep his eyes closed. Provides his name age and date of birth. Follows simple commands.  Sensation to light touch intact in all 4 extremities Michela Pitcher was dizzy, so didn't want to do finger to nose/rapid alternating movements  Skin:  Severe plaque like vascular changes to knees B/L with a scab on L Shin that was flaking off some  Wooding of hands from loss of fluids  Psychiatric:  Flat affect   Lab Results Last 48 Hours        Results for orders placed or performed during the hospital encounter of 03/20/19 (from the past 48 hour(s))  CBC Status: Abnormal   Collection Time: 03/30/19 6:40 AM  Result Value Ref Range   WBC 7.6 4.0 - 10.5 K/uL   RBC 2.36 (L) 4.22 - 5.81 MIL/uL   Hemoglobin 7.4 (L) 13.0 - 17.0 g/dL   HCT 22.7 (L) 39.0 - 52.0 %   MCV 96.2 80.0 - 100.0 fL   MCH 31.4 26.0 - 34.0 pg   MCHC 32.6 30.0 - 36.0 g/dL   RDW 15.4 11.5 - 15.5 %   Platelets 200 150 - 400 K/uL   nRBC 0.0 0.0 - 0.2 %    Comment: Performed at [facility], [place] [address] [place], [place] [id number]  Basic metabolic panel Status: Abnormal   Collection Time: 03/30/19 6:40 AM  Result Value Ref Range   Sodium 138 135 - 145 mmol/L   Potassium 3.4 (L) 3.5 - 5.1 mmol/L   Chloride 107 98 - 111 mmol/L   CO2 23 22 - 32 mmol/L   Glucose, Bld 112 (H) 70 - 99 mg/dL   BUN 12 8 - 23 mg/dL   Creatinine, Ser 0.97 0.61 - 1.24 mg/dL   Calcium 8.0 (L) 8.9 - 10.3 mg/dL   GFR calc non Af Amer >60 >60 mL/min   GFR calc Af Amer >60 >60 mL/min   Anion gap 8 5 - 15    Comment: Performed at [facility] [address], [place],  [address]  Glucose, capillary Status: None   Collection Time: 03/30/19 6:43 AM  Result Value Ref Range   Glucose-Capillary 96 70 - 99 mg/dL  Glucose, capillary Status: Abnormal   Collection Time: 03/30/19 12:00 PM  Result Value Ref Range   Glucose-Capillary 114 (H) 70 - 99 mg/dL  Glucose, capillary Status: Abnormal   Collection Time: 03/30/19 3:53 PM  Result  Value Ref Range   Glucose-Capillary 115 (H) 70 - 99 mg/dL  Glucose, capillary Status: Abnormal   Collection Time: 03/30/19 4:32 PM  Result Value Ref Range   Glucose-Capillary 106 (H) 70 - 99 mg/dL  Glucose, capillary Status: None   Collection Time: 03/30/19 9:15 PM  Result Value Ref Range   Glucose-Capillary 97 70 - 99 mg/dL  Glucose, capillary Status: None   Collection Time: 03/31/19 6:04 AM  Result Value Ref Range   Glucose-Capillary 94 70 - 99 mg/dL  Basic metabolic  panel Status: Abnormal   Collection Time: 03/31/19 6:19 AM  Result Value Ref Range   Sodium 136 135 - 145 mmol/L   Potassium 3.5 3.5 - 5.1 mmol/L   Chloride 101 98 - 111 mmol/L   CO2 23 22 - 32 mmol/L   Glucose, Bld 100 (H) 70 - 99 mg/dL   BUN 10 8 - 23 mg/dL   Creatinine, Ser 0.93 0.61 - 1.24 mg/dL   Calcium 8.5 (L) 8.9 - 10.3 mg/dL   GFR calc non Af Amer >60 >60 mL/min   GFR calc Af Amer >60 >60 mL/min   Anion gap 12 5 - 15    Comment: Performed at [facility] [address], [place], [place] [address]  Magnesium Status: None   Collection Time: 03/31/19 6:19 AM  Result Value Ref Range   Magnesium 2.0 1.7 - 2.4 mg/dL    Comment: Performed at [facility] [address], [place], [place] [address]  CBC Status: Abnormal   Collection Time: 03/31/19 6:19 AM  Result Value Ref Range   WBC 8.6 4.0 - 10.5 K/uL   RBC 2.53 (L) 4.22 - 5.81 MIL/uL   Hemoglobin 7.9 (L) 13.0 - 17.0 g/dL   HCT 23.7 (L) 39.0 - 52.0 %   MCV 93.7 80.0 - 100.0 fL   MCH 31.2 26.0 - 34.0 pg   MCHC 33.3 30.0 - 36.0 g/dL   RDW 15.0 11.5 - 15.5 %   Platelets 244 150 - 400 K/uL   nRBC 0.0 0.0 - 0.2 %    Comment: Performed at [facility], [place] [address], [place], [place] [address]  Glucose, capillary Status: None   Collection Time: 03/31/19 12:04 PM  Result Value Ref Range   Glucose-Capillary 97 70 - 99 mg/dL   Comment 1 QC Due    Comment 2 Notify RN    Comment 3 Document in Chart   Glucose, capillary Status:  Abnormal   Collection Time: 03/31/19 4:34 PM  Result Value Ref Range   Glucose-Capillary 102 (H) 70 - 99 mg/dL  Glucose, capillary Status: Abnormal   Collection Time: 03/31/19 9:15 PM  Result Value Ref Range   Glucose-Capillary 103 (H) 70 - 99 mg/dL   Imaging Results (Last 48 hours)    Medical Problem List and Plan:  1. Right side weakness with dysphagia secondary to left ACA pericallosal segment aneurysm S/P stent assisted coiling as well as left ICA proximal stenosis S/P CEA followed by stent angioplasty 03/26/2019 per interventional radiology with post procedure right cerebellar peduncle infarction  -patient may shower if covers IR sites  -ELOS/Goals: 2 to 2.5 weeks; supervision to Mod I  2. Antithrombotics:  -DVT/anticoagulation: Eliquis  -antiplatelet therapy: Brilinta  3. Pain Management: Tramadol as needed  4. Mood: Provide emotional support  -antipsychotic agents: N/A  5. Neuropsych: This patient is capable of making decisions on his own behalf.  6. Skin/Wound Care: Routine skin checks  7. Fluids/Electrolytes/Nutrition: Routine in and outs with follow-up chemistries  8. Hypertension. Patient on no home med antihypertensive medications. Monitor with increased mobility  9. Hyperlipidemia. Lipitor  10. Tobacco abuse. Counseling  11. Prediabetes. Hemoglobin A1c 6.4. SSI. Patient on no diabetic agents prior to admission. Will likely need something for this prior to d/c.  12. BPH /UTI. Flomax 0.4 mg daily. Urine culture 03/28/2019 no growth. Urine study 03/29/2019 + nitrite. Empiric Levaquin x5 doses completed.  -per note, had replaced the foley due to urinary retention? Monitor  13. Hypothyroidism. TSH 45! Synthroid started. Patient will need follow-up thyroid panel in 3 months  14. Per chart, severe PAD with R foot ischemia- has LE foot/ankle pain on exam that is likely c/w PAD- will monitor closely for skin breakdown, etc.  Cathlyn Parsons, PA-C  04/01/2019  I have personally  performed a face to face diagnostic evaluation of this patient and formulated the key components of the plan. Additionally, I have personally reviewed laboratory data, imaging studies, as well as relevant notes and concur with the physician assistant's documentation above.  The patient's status has not changed from the original H&P. Any changes in documentation from the acute care chart have been noted above.

## 2019-04-04 NOTE — Evaluation (Signed)
Speech Language Pathology Assessment and Plan  Patient Details  Name: Jeremy Sherman MRN: [id number] Date of Birth: April 25, 1950  SLP Diagnosis: Cognitive Impairments;Dysphagia  Rehab Potential: Good ELOS: 2- 2.5 weeks    Today's Date: 04/04/2019 SLP Individual Time: 8280-0349 SLP Individual Time Calculation (min): 55 min   Problem List:  Patient Active Problem List   Diagnosis Date Noted  . 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 AKI (acute kidney injury) ([place])   . Aneurysm of anterior cerebral artery   . Essential hypertension   . Prediabetes   . Dysphagia, post-stroke   . Thrombus of left atrial appendage   . 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 Carotid stenosis 03/21/2019  . CAD (coronary artery disease) 03/21/2019  . Brain aneurysm 03/21/2019  . Stroke [facility]) 03/20/2019   Past Medical History:  Past Medical History:  Diagnosis Date  . CAD (coronary artery disease)   . Carotid stenosis, left    L ICA 50%  . Hyperlipidemia   . Hypertension   . Hypertensive urgency 03/11/2019  . 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 Prediabetes    Past Surgical History:  Past Surgical History:  Procedure Laterality Date  . CORONARY ARTERY BYPASS GRAFT  07/02/2007   [place]-> LAD SVG-> PD branch of RCA [name]) at [place] Left 03/26/2019   Procedure: EXPOSURE OF LEFT COMMON CAROTID ARTERY AND PLACEMENT OF SHEATH;  Surgeon: Marty Heck, MD;  Location: [place];  Service: Vascular;  Laterality: Left;  . ENDARTERECTOMY Left 03/26/2019   Procedure: REMOVAL OF CAROTID SHEATH AND CLOSURE OF  CAROTID ARTERY;  Surgeon: Serafina Mitchell, MD;  [facility];  Service: Vascular;  Laterality: Left;  . IR ANGIO INTRA EXTRACRAN SEL COM CAROTID INNOMINATE BILAT MOD SED  03/21/2019  . IR ANGIO INTRA EXTRACRAN SEL INTERNAL CAROTID UNI L MOD SED  03/26/2019  . IR ANGIO VERTEBRAL SEL SUBCLAVIAN INNOMINATE BILAT MOD SED   03/21/2019  . IR ANGIOGRAM FOLLOW UP STUDY  03/26/2019  . IR ANGIOGRAM FOLLOW UP STUDY  03/26/2019  . IR ANGIOGRAM FOLLOW UP STUDY  03/26/2019  . IR ANGIOGRAM FOLLOW UP STUDY  03/26/2019  . IR ANGIOGRAM FOLLOW UP STUDY  03/26/2019  . IR ANGIOGRAM FOLLOW UP STUDY  03/26/2019  . IR CT HEAD LTD  03/26/2019  . IR INTRA CRAN STENT  03/26/2019  . IR INTRAVSC STENT CERV CAROTID W/O EMB-PROT MOD SED INC ANGIO  03/26/2019  . IR NEURO EACH ADD'L AFTER BASIC UNI LEFT (MS)  03/26/2019  . IR TRANSCATH/EMBOLIZ  03/26/2019    Assessment / Plan / Recommendation Clinical Impression   HPI: Jeremy Sherman is a 69 year old right-handed male with history of hypertension, hyperlipidemia, prediabetes, 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 Per chart review patient lives with spouse and daughter. 1 level home 4 steps to entry. Works full-time as a Glass blower/designer. By report patient had episode of dizziness confusion on 03/17/2019. He went to his PCPs office became unresponsive hypotensive and diaphoretic. Blood pressure reportedly lower than 100 he was brought by EMS to [facility]. Patient had recently been hospitalized at [facility] 03/11/2019 to 03/13/2019 for hypertensive urgency. 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 TEE at outside hospital  showed left atrial appendage thrombus was placed on intravenous heparin. He was transferred to [facility] for further evaluation. 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 Oneida Alar 03/26/2019. Found to have right hemiparesis following procedure. Follow-up imaging showed early subacute infarct in the right cerebellar peduncle,  likely small vessel disease. Patient did remain intubated through 03/27/2019. He had been receiving Cleviprex for blood pressure control. Initially on intravenous heparin transition to Eliquis 5 mg twice daily as well as Brilinta. Patient with persistent low-grade fever with urinalysis 03/29/2019 + nitrite placed on Levaquin x5 doses that has been completed blood cultures are pending Currently maintained on a dysphagia #2 thin liquid diet. Hospital course findings of elevated TSH level 45.808 and placed on Synthroid. Therapy evaluations completed and patient was admitted for a comprehensive rehab program 04/03/19 with SLP evaluations completed 04/04/19 with results as follows:  Bedside Swallow Evaluation: Pt presents with mild-mod oral dysphagia characterized by prolonged and largely inefficient mastication of solids. He is edentulous, however reports he typically has no difficulty masticating regular texture solids at baseline. Suspect pt's oral phase impairments are further impacted by attention deficits as well as deconditioning. No overt s/sx aspiration were observed across puree, Dys 2, or thin liquid intake, however intermittent increased work of breathing noted, particularly after taking sips of thin. SLP emphasized importance of slow rate and small bites/sips for pt too best coordinate respirations with swallow sequence. Oral motor exam was unremarkable; no weakness of asymmetry noted. Recommend continue Dys 2 solids, thin liquids, medications may be administered whole with liquid if small - please break large pills or administer in applesauce.   Cognitive-Linguistic Evaluation: Pt presents with cognitive deficits that are likely to have a moderate functional impact on pt's daily living. He scored 10/30 on MOCA version 7.1, with most noteworthy impairments noted in the areas of executive functioning, problem solving, selective attention, and short term recall. Both storage and retrieval deficits also noted  during recall tasks. Pt initially oriented to season and approximation of date, however later stated it was July 1952, required Max-Total A for re-orienting to time. Calendar posted in pt's room to assist. Pt remained oriented to self and situation throughout evaluation, however unable to identify physical or cognitive deficits since aneurysm and strokes. He also did not recall functional information such as swallowing precautions or ~50% familiar medications.   Recommend pt receive skilled ST to address dysphagia and cognitive impairments detailed above in order to maximize diet safety and efficiency as well as functional independence and reduce burden of care at discharge.    Skilled Therapeutic Interventions          Cognitive-Linguistic and bedside swallow evaluations were completed and results were reviewed with pt (see above).    SLP Assessment  Patient will need skilled Speech Lanaguage Pathology Services during CIR admission    Recommendations  SLP Diet Recommendations: Dysphagia 2 (Fine chop);Thin Liquid Administration via: Cup;Straw Medication Administration: Whole meds with puree(must be small) Supervision: Intermittent supervision to cue for compensatory strategies Compensations: Slow rate;Small sips/bites;Minimize environmental distractions Postural Changes and/or Swallow Maneuvers: Seated upright 90 degrees;Upright 30-60 min after meal Oral Care Recommendations: Oral care BID Patient destination: Home Follow up Recommendations: Home Health SLP;24 hour supervision/assistance Equipment Recommended: None recommended by SLP    SLP Frequency 3 to 5 out of 7 days   SLP Duration  SLP Intensity  SLP  Treatment/Interventions 2- 2.5 weeks  Minumum of 1-2 x/day, 30 to 90 minutes  Cognitive remediation/compensation;Cueing hierarchy;Dysphagia/aspiration precaution training;Functional tasks;Patient/family education;Internal/external aids    Pain Pain Assessment Pain Scale:  Faces Faces Pain Scale: No hurt      SLP Evaluation Cognition Overall Cognitive Status: Impaired/Different from baseline Arousal/Alertness: Awake/alert Orientation Level: Oriented to person;Oriented to place;Disoriented to time;Oriented to situation Attention: Selective Sustained Attention: Appears intact Selective Attention: Impaired Selective Attention Impairment: Verbal basic Memory: Impaired Memory Impairment: Decreased short term memory;Retrieval deficit;Storage deficit Decreased Short Term Memory: Verbal basic;Functional basic Awareness: Impaired Awareness Impairment: Intellectual impairment Problem Solving: Impaired Problem Solving Impairment: Verbal basic;Functional basic Executive Function: Reasoning Reasoning: Impaired Reasoning Impairment: Functional basic Safety/Judgment: Impaired  Comprehension Auditory Comprehension Overall Auditory Comprehension: Appears within functional limits for tasks assessed Commands: Within Functional Limits Conversation: Simple Visual Recognition/Discrimination Discrimination: Not tested Reading Comprehension Reading Status: Not tested Expression Expression Primary Mode of Expression: Verbal Verbal Expression Overall Verbal Expression: Appears within functional limits for tasks assessed Level of Generative/Spontaneous Verbalization: Conversation Repetition: No impairment Naming: No impairment Pragmatics: No impairment Written Expression Written Expression: Not tested Oral Motor Oral Motor/Sensory Function Overall Oral Motor/Sensory Function: Within functional limits Motor Speech Overall Motor Speech: Appears within functional limits for tasks assessed Respiration: Within functional limits Resonance: Within functional limits Articulation: Within functional limitis Intelligibility: Intelligible Motor Planning: Witnin functional limits Motor Speech Errors: Not applicable  Intelligibility: Intelligible  Bedside Swallowing  Assessment General Date of Onset: 03/11/19 Previous Swallow Assessment: BSE 03/28/19 Diet Prior to this Study: Dysphagia 2 (chopped);Thin liquids Temperature Spikes Noted: N/A Respiratory Status: Room air History of Recent Intubation: Yes Length of Intubations (days): 1 days Date extubated: 03/27/19 Behavior/Cognition: Alert;Cooperative;Distractible Oral Cavity - Dentition: Edentulous;Other (Comment)(pt does not use dentures) Self-Feeding Abilities: Able to feed self Vision: Functional for self-feeding Patient Positioning: Upright in bed Baseline Vocal Quality: Normal Volitional Cough: Strong Volitional Swallow: Able to elicit  Oral Care Assessment Does patient have any of the following "high(er) risk" factors?: None of the above Does patient have any of the following "at risk" factors?: None of the above Patient is LOW RISK: Follow universal precautions (see row information) Ice Chips Ice chips: Not tested Thin Liquid Thin Liquid: Within functional limits Presentation: Straw;Cup Nectar Thick Nectar Thick Liquid: Not tested Honey Thick Honey Thick Liquid: Not tested Puree Puree: Within functional limits Presentation: Spoon Solid Solid: Impaired Presentation: Self Fed Oral Phase Impairments: Impaired mastication Oral Phase Functional Implications: Prolonged oral transit;Impaired mastication BSE Assessment Risk for Aspiration Impact on safety and function: Mild aspiration risk Other Related Risk Factors: Cognitive impairment(Pt occasionally appeared to have increase in work of breathing)  Short Term Goals: Week 1: SLP Short Term Goal 1 (Week 1): Pt will consume current diet with minimal overt s/sx aspiration and demonstrate efficient mastication and oral clearance with Min A verbal cues for use of safe swallow strategies. SLP Short Term Goal 2 (Week 1): Pt will consume trials of Dys 3 solids and demonstrate efficient mastication and oral clearance X3 prior to upgrade. SLP  Short Term Goal 3 (Week 1): Pt will demonstrate ability to problem solve during functional familiar tasks with Min A verbal/visual cues. SLP Short Term Goal 4 (Week 1): Pt will selectively attend to tasks with Min A verbal/verbal cues for redirection. SLP Short Term Goal 5 (Week 1): Pt will recall new and daily information with Mod A verbal/visual cues for use of compensatory strategies. SLP Short Term Goal 6 (Week 1): Pt will demonstrate intellectual awareness by  stating 1 physical and 1 cognitive deficit with Mod A verbal/visual cues.  Refer to Care Plan for Long Term Goals  Recommendations for other services: None   Discharge Criteria: Patient will be discharged from SLP if patient refuses treatment 3 consecutive times without medical reason, if treatment goals not met, if there is a change in medical status, if patient makes no progress towards goals or if patient is discharged from hospital.  The above assessment, treatment plan, treatment alternatives and goals were discussed and mutually agreed upon: by patient  Arbutus Leas 04/04/2019, 9:52 AM

## 2019-04-04 NOTE — IPOC Note (Signed)
Overall Plan of Care [facility]) Patient Details Name: Jeremy Sherman MRN: [id number] DOB: 06-08-1950  Admitting Diagnosis: <principal problem not specified>  Hospital Problems: Active Problems:   Stenosis of right cerebellar artery     Functional Problem List: Nursing Bladder, Bowel, Edema, Endurance, Pain, Safety, Perception  PT Balance, Edema, Safety, Sensory, Endurance, Skin Integrity, Motor, Nutrition, Pain, Perception  OT Balance, Cognition, Endurance, Motor, Safety, Pain  SLP Cognition, Nutrition  TR         Basic ADL's: OT Grooming, Bathing, Dressing, Toileting, Eating     Advanced  ADL's: OT       Transfers: PT Bed Mobility, Bed to Chair, Car, Manufacturing systems engineer, Metallurgist: PT Ambulation, Emergency planning/management officer, Stairs     Additional Impairments: OT None  SLP Swallowing, Social Cognition   Problem Solving, Memory, Attention, Awareness  TR      Anticipated Outcomes Item Anticipated Outcome  Self Feeding independent  Swallowing  Supervision A   Basic self-care  supervision  Toileting  supervision   Bathroom Transfers min guard assist  Bowel/Bladder  manage bowel and bladder with mod I ssist  Transfers  supervision using LRAD  Locomotion  CGA using LRAD  Communication     Cognition  Supervision-Min A  Pain  pain less than 4 on scale of 0-10  Safety/Judgment  remain free of injury, prevent falls with cues and remiders   Therapy Plan: PT Intensity: Minimum of 1-2 x/day ,45 to 90 minutes PT Frequency: 5 out of 7 days PT Duration Estimated Length of Stay: ~3 weeks OT Intensity: Minimum of 1-2 x/day, 45 to 90 minutes OT Frequency: 5 out of 7 days OT Duration/Estimated Length of Stay: 14-16 days SLP Intensity: Minumum of 1-2 x/day, 30 to 90 minutes SLP Frequency: 3 to 5 out of 7 days SLP Duration/Estimated Length of Stay: 2- 2.5 weeks   Due to the current state of emergency, patients may not be receiving their 3-hours  of Medicare-mandated therapy.   Team Interventions: Nursing Interventions Patient/Family Education, Pain Management, Medication Management, Bladder Management, Bowel Management, Psychosocial Support  PT interventions Ambulation/gait training, Community reintegration, DME/adaptive equipment instruction, Neuromuscular re-education, Psychosocial support, Stair training, UE/LE Strength taining/ROM, Wheelchair propulsion/positioning, Training and development officer, Discharge planning, Functional electrical stimulation, Pain management, Skin care/wound management, Therapeutic Activities, UE/LE Coordination activities, Cognitive remediation/compensation, Disease management/prevention, Functional mobility training, Patient/family education, Splinting/orthotics, Therapeutic Exercise, Visual/perceptual remediation/compensation  OT Interventions Balance/vestibular training, Discharge planning, Pain management, Self Care/advanced ADL retraining, Therapeutic Activities, UE/LE Coordination activities, Cognitive remediation/compensation, Patient/family education, Therapeutic Exercise, Functional mobility training, Community reintegration, Engineer, drilling, Neuromuscular re-education, UE/LE Strength taining/ROM  SLP Interventions Cognitive remediation/compensation, English as a second language teacher, Dysphagia/aspiration precaution training, Functional tasks, Patient/family education, Internal/external aids  TR Interventions    SW/CM Interventions Discharge Planning, Disease Management/Prevention, Psychosocial Support, Patient/Family Education   Barriers to Discharge MD  Medical stability  Nursing      PT Inaccessible home environment, Home environment access/layout, Lack of/limited family support    OT      SLP      SW       Team Discharge Planning: Destination: PT-Home ,OT- Home , SLP-Home Projected Follow-up: PT-Home health PT, 24 hour supervision/assistance, OT-  Home health OT, 24 hour  supervision/assistance, SLP-Home Health SLP, 24 hour supervision/assistance Projected Equipment Needs: PT-To be determined, OT- To be determined, SLP-None recommended by SLP Equipment Details: PT- , OT-  Patient/family involved in discharge planning: PT- Patient,  OT-Patient, SLP-Patient  MD ELOS: 10-14d Medical Rehab  Prognosis:  Good Assessment:  69 year old right-handed male with history of hypertension, hyperlipidemia, prediabetes, 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 Per chart review patient lives with spouse and daughter. 1 level home 4 steps to entry. Works full-time as a Glass blower/designer. By report patient had episode of dizziness confusion on 03/17/2019. He went to his PCPs office became unresponsive hypotensive and diaphoretic. Blood pressure reportedly lower than 100 he was brought by EMS to [facility]. Patient had recently been hospitalized at [facility] 03/11/2019 to 03/13/2019 for hypertensive urgency. 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 TEE at outside hospital showed left atrial appendage thrombus was placed on intravenous heparin. He was transferred to [facility] for further evaluation. 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 Oneida Alar 03/26/2019. Found to have right hemiparesis following procedure. Follow-up imaging showed early subacute infarct in the right cerebellar peduncle, likely small vessel disease. Patient did remain intubated through 03/27/2019. He had been receiving Cleviprex for blood pressure control. Initially on intravenous heparin transition to Eliquis 5 mg twice daily as well as Brilinta. Patient with persistent  low-grade fever with urinalysis 03/29/2019 + nitrite placed on Levaquin x5 doses that has been completed blood cultures are pending Currently maintained on a dysphagia #1 thin liquid diet. Hospital course findings of elevated TSH level 45.808 and placed on Synthroid. Therapy evaluations completed and patient was admitted for a comprehensive rehab program    See Team Conference Notes for weekly updates to the plan of care

## 2019-04-04 NOTE — Care Management (Signed)
Patient Details  Name: Jeremy Sherman MRN: [id number] Date of Birth: [birth year]  Today's Date: 04/04/2019  Problem List:  Patient Active Problem List   Diagnosis Date Noted  . 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 AKI (acute kidney injury) ([place])   . Aneurysm of anterior cerebral artery   . Essential hypertension   . Prediabetes   . Dysphagia, post-stroke   . Thrombus of left atrial appendage   . 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 Carotid stenosis 03/21/2019  . CAD (coronary artery disease) 03/21/2019  . Brain aneurysm 03/21/2019  . Stroke [facility]) 03/20/2019   Past Medical History:  Past Medical History:  Diagnosis Date  . CAD (coronary artery disease)   . Carotid stenosis, left    L ICA 50%  . Hyperlipidemia   . Hypertension   . Hypertensive urgency 03/11/2019  . 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 Prediabetes    Past Surgical History:  Past Surgical History:  Procedure Laterality Date  . CORONARY ARTERY BYPASS GRAFT  07/02/2007   [place]-> LAD SVG-> PD branch of RCA [name]) at [place] Left 03/26/2019   Procedure: EXPOSURE OF LEFT COMMON CAROTID ARTERY AND PLACEMENT OF SHEATH;  Surgeon: Marty Heck, MD;  Location: [place];  Service: Vascular;  Laterality: Left;  . ENDARTERECTOMY Left 03/26/2019   Procedure: REMOVAL OF CAROTID SHEATH AND CLOSURE OF  CAROTID ARTERY;  Surgeon: Serafina Mitchell, MD;  [facility];  Service: Vascular;  Laterality: Left;  . IR ANGIO INTRA EXTRACRAN SEL COM CAROTID INNOMINATE BILAT MOD SED  03/21/2019  . IR ANGIO INTRA EXTRACRAN SEL INTERNAL CAROTID UNI L MOD SED  03/26/2019  . IR ANGIO VERTEBRAL SEL SUBCLAVIAN INNOMINATE BILAT MOD SED  03/21/2019  . IR ANGIOGRAM FOLLOW UP STUDY  03/26/2019  . IR ANGIOGRAM FOLLOW UP STUDY  03/26/2019  . IR ANGIOGRAM FOLLOW UP STUDY  03/26/2019  . IR ANGIOGRAM FOLLOW UP STUDY  03/26/2019  . IR ANGIOGRAM FOLLOW UP STUDY   03/26/2019  . IR ANGIOGRAM FOLLOW UP STUDY  03/26/2019  . IR CT HEAD LTD  03/26/2019  . IR INTRA CRAN STENT  03/26/2019  . IR INTRAVSC STENT CERV CAROTID W/O EMB-PROT MOD SED INC ANGIO  03/26/2019  . IR NEURO EACH ADD'L AFTER BASIC UNI LEFT (MS)  03/26/2019  . IR TRANSCATH/EMBOLIZ  03/26/2019   Social History:  reports that he has been smoking cigarettes. He started smoking about 45 years ago. He has a 22.50 pack-year smoking history. He has never used smokeless tobacco. He reports previous alcohol use. No history on file for drug.  Family / Support Systems Marital Status: Married Patient Roles: Spouse, Parent Spouse/Significant Other: Neurosurgeon Children: Daughter Anticipated Caregiver: Wife Ability/Limitations of Caregiver: No limitations Caregiver Availability: 24/7  Social History Preferred language: English Religion: Methodist Employment Status: Employed Name of Employer: Building surveyor Return to Work Plans: Would like to be able to return to work if possible   Abuse/Neglect Abuse/Neglect Assessment Can Be Completed: Yes Physical Abuse: Denies Verbal Abuse: Denies Sexual Abuse: Denies Exploitation of patient/patient's resources: Denies Self-Neglect: Denies  Emotional Status Pt's affect, behavior and adjustment status: Flat affect, normal behavior and adjustment status  Patient / Family Perceptions, Expectations & Goals Pt/Family understanding of illness & functional limitations: Patient appears to have a poor understanding of current illness and functional limitations Premorbid pt/family roles/activities: Independent prior to admission, worked full time and  wife managed the home Anticipated changes in roles/activities/participation: Wife may need to provide supervision-assistance at discharge Pt/family expectations/goals: The patient would like to "get back to his normal routineHaematologist available at discharge: Daughter will provide  transportation at discharge; wife does not drive  Discharge Planning Living Arrangements: Spouse/significant other Support Systems: Spouse/significant other, Children Type of Residence: Private residence Does the patient have any problems obtaining your medications?: No Home Management: Wife managed groceries, cooking , cleaning and laundry prior to admission Patient/Family Preliminary Plans: Daughter lives with the patient and will assist as needed with wife Social Work Anticipated Follow Up Needs: [place] Additional Notes/Comments: One level home with 4 step entry, no equipment at home Expected length of stay: 2-2.5 weeks  Clinical Impression Pleasant gentleman who notes he does not know exactly what happened to him to be hospitalized; he was told he had two heart attacks. Noted he was tired but wanted to get home as soon as possible and get back to his usual routine, working. Reported his daughter lives with him and his wife and could assist some but she was a Pharmacist, hospital and does her own things, therefore, not always available. Meal tray in front of patient however appeared not to be aware. Set up patient to eat his meal and he was able to feed himself and carry on conversation appropriately.  Margarito Liner 04/04/2019, 3:39 PM

## 2019-04-04 NOTE — Evaluation (Signed)
Occupational Therapy Assessment and Plan  Patient Details  Name: Jeremy Sherman MRN: [id number] Date of Birth: [birth year]  OT Diagnosis: abnormal posture, acute pain, cognitive deficits and muscle weakness (generalized) Rehab Potential: Rehab Potential (ACUTE ONLY): Excellent ELOS: 14-16 days   Today's Date: 04/04/2019 OT Individual Time: 2595-6387 OT Individual Time Calculation (min): 69 min     Problem List:  Patient Active Problem List   Diagnosis Date Noted  . 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 AKI (acute kidney injury) ([place])   . Aneurysm of anterior cerebral artery   . Essential hypertension   . Prediabetes   . Dysphagia, post-stroke   . Thrombus of left atrial appendage   . 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 Carotid stenosis 03/21/2019  . CAD (coronary artery disease) 03/21/2019  . Brain aneurysm 03/21/2019  . Stroke [facility]) 03/20/2019    Past Medical History:  Past Medical History:  Diagnosis Date  . CAD (coronary artery disease)   . Carotid stenosis, left    L ICA 50%  . Hyperlipidemia   . Hypertension   . Hypertensive urgency 03/11/2019  . 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 Prediabetes    Past Surgical History:  Past Surgical History:  Procedure Laterality Date  . CORONARY ARTERY BYPASS GRAFT  07/02/2007   [place]-> LAD SVG-> PD branch of RCA [name]) at [place] Left 03/26/2019   Procedure: EXPOSURE OF LEFT COMMON CAROTID ARTERY AND PLACEMENT OF SHEATH;  Surgeon: Marty Heck, MD;  Location: [place];  Service: Vascular;  Laterality: Left;  . ENDARTERECTOMY Left 03/26/2019   Procedure: REMOVAL OF CAROTID SHEATH AND CLOSURE OF  CAROTID ARTERY;  Surgeon: Serafina Mitchell, MD;  Location: [facility] OR;  Service: Vascular;  Laterality: Left;  . IR ANGIO INTRA EXTRACRAN SEL COM CAROTID INNOMINATE BILAT MOD SED  03/21/2019  . IR ANGIO INTRA EXTRACRAN SEL INTERNAL CAROTID UNI L MOD SED   03/26/2019  . IR ANGIO VERTEBRAL SEL SUBCLAVIAN INNOMINATE BILAT MOD SED  03/21/2019  . IR ANGIOGRAM FOLLOW UP STUDY  03/26/2019  . IR ANGIOGRAM FOLLOW UP STUDY  03/26/2019  . IR ANGIOGRAM FOLLOW UP STUDY  03/26/2019  . IR ANGIOGRAM FOLLOW UP STUDY  03/26/2019  . IR ANGIOGRAM FOLLOW UP STUDY  03/26/2019  . IR ANGIOGRAM FOLLOW UP STUDY  03/26/2019  . IR CT HEAD LTD  03/26/2019  . IR INTRA CRAN STENT  03/26/2019  . IR INTRAVSC STENT CERV CAROTID W/O EMB-PROT MOD SED INC ANGIO  03/26/2019  . IR NEURO EACH ADD'L AFTER BASIC UNI LEFT ([place])  03/26/2019  . IR TRANSCATH/EMBOLIZ  03/26/2019    Assessment & Plan Clinical Impression: Patient is a 69 y.o. year old male with recent admission to the hospital on patient had recently been hospitalized at [facility] 03/11/2019 to 03/13/2019 for hypertensive urgency. 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 TEE at outside hospital showed left atrial appendage thrombus was placed on intravenous heparin. He was transferred to [facility] for further evaluation. 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 Oneida Alar 03/26/2019. Found to have right hemiparesis following procedure.  Follow-up imaging showed early subacute infarct in the right cerebellar peduncle, likely small vessel disease. Patient did remain intubated through 03/27/2019.  Patient transferred to CIR on 04/03/2019 .    Patient currently requires max with basic self-care skills secondary to muscle weakness, ataxia and decreased coordination, decreased attention, decreased awareness, decreased problem solving and decreased memory and decreased standing balance, decreased  postural control and decreased balance strategies.  Prior to hospitalization, patient could complete ADLs with independent .  Patient will benefit from skilled intervention to decrease level of assist with basic self-care skills and increase independence with basic self-care skills prior to discharge home with care partner.  Anticipate patient will require 24 hour supervision and follow up home health.  OT - End of Session Activity Tolerance: Tolerates 30+ min activity with multiple rests Endurance Deficit: Yes Endurance Deficit Description: limited OOB activity due to vitals and fatigue OT Assessment Rehab Potential (ACUTE ONLY): Excellent OT Patient demonstrates impairments in the following area(s): Balance;Cognition;Endurance;Motor;Safety;Pain OT Basic ADL's Functional Problem(s): Grooming;Bathing;Dressing;Toileting;Eating OT Transfers Functional Problem(s): Toilet;Tub/Shower OT Additional Impairment(s): None OT Plan OT Intensity: Minimum of 1-2 x/day, 45 to 90 minutes OT Frequency: 5 out of 7 days OT Duration/Estimated Length of Stay: 14-16 days OT Treatment/Interventions: Balance/vestibular training;Discharge planning;Pain management;Self Care/advanced ADL retraining;Therapeutic Activities;UE/LE Coordination activities;Cognitive remediation/compensation;Patient/family education;Therapeutic Exercise;Functional mobility training;Community reintegration;DME/adaptive equipment instruction;Neuromuscular re-education;UE/LE Strength taining/ROM OT Self Feeding Anticipated Outcome(s): independent OT Basic Self-Care Anticipated Outcome(s): supervision OT Toileting Anticipated Outcome(s): supervision OT Bathroom Transfers Anticipated Outcome(s): min guard assist OT Recommendation Patient destination: Home Follow Up Recommendations: Home health OT;24 hour supervision/assistance Equipment Recommended: To be determined   Skilled Therapeutic Intervention Pt worked on bathing and dressing EOB  during session.  Min assist for supine to sit with pt sitting statically with supervision.  He was able to complete UB bathing with setup and mod instructional cueing for sequencing.  He then was able to complete some LB bathing with overall mod assist.  Decreased ability for washing his lower legs and feet noted, with therapist assist. Pt with severe neglect of his feet and toenails as well.  He was able to stand from the elevated bed with mod assist and use of the RW for support.  He was able to maintain his standing balance with mod assist as well for completion of washing his front and back peri area.  With standing he reported feeling like he was going to pass out.  Upon sitting, he remained feeling this way and kept his eyes closed.  He was responsive to questions however and maintained sitting balance with supervision.  BP taken at 161/135 and 172/56, both with automated cuff.  Had pt return to supine with mod assist and nursing was called.  His BP was checked in supine with HOB slightly elevated at 140/71 and 121/75.  Pt left in bed to complete session.  Total assist for donning brief and pants rolling side to side with mod assist for pullover shirt in long sitting with HOB elevated.  Pt with call button and phone in reach and NT present to check CBG.  Discussed ELOS of around 2 weeks and supervision to min assist level goals.    OT Evaluation Precautions/Restrictions  Precautions Precautions: Fall Precaution Comments: monitor BP Restrictions Weight Bearing Restrictions: No   Vital Signs Therapy Vitals Temp: 98.5 F (36.9 C) Pulse Rate: 86 Resp: 18 BP: (!) 159/81 Patient Position (if appropriate): Lying Oxygen Therapy SpO2: 100 % O2 Device: Room Air Pain Pain Assessment Pain Scale: 0-10  Pain Score: 0-No pain Home Living/Prior Functioning Home Living Living Arrangements: Spouse/significant other Available Help at Discharge: Family, Available 24 hours/day Type of Home: House Home  Access: Stairs to enter [employer] of Steps: 4 in back and 2 in front Entrance Stairs-Rails: None Home Layout: One level Bathroom Shower/Tub: Optometrist: Yes Additional Comments: works full time as a Glass blower/designer  Lives With: Spouse, Daughter IADL History Homemaking Responsibilities: No Current License: Yes Education: completed high school and some college 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 Prior Function Level of Independence: Independent with gait, Independent with transfers  Able to Take Stairs?: Yes Driving: Yes Vocation: Full time employment Vocation Requirements: Glass blower/designer ADL ADL Eating: Set up Where Assessed-Eating: Bed level Grooming: Supervision/safety Where Assessed-Grooming: Edge of bed Upper Body Bathing: Supervision/safety Where Assessed-Upper Body Bathing: Edge of bed Lower Body Bathing: Moderate assistance Where Assessed-Lower Body Bathing: Edge of bed Upper Body Dressing: Maximal assistance Where Assessed-Upper Body Dressing: Bed level Lower Body Dressing: Maximal assistance Where Assessed-Lower Body Dressing: Edge of bed Vision Baseline Vision/History: Wears glasses Wears Glasses: Reading only Patient Visual Report: No change from baseline Vision Assessment?: (vision to be examined further in treatment sessions) Alignment/Gaze Preference: Within Defined Limits Perception  Perception: Within Functional Limits Inattention/Neglect: Other (comment)(difficult to assess but possible R inattention) Praxis Praxis: Intact Praxis Impairment Details: Motor planning Cognition Overall Cognitive Status: Impaired/Different from baseline Arousal/Alertness: Awake/alert Orientation Level: Person;Place;Situation Person: Oriented Place: Oriented Situation: Oriented Year: Other (Comment)(1992) Month: January Day of Week: Incorrect(Saturday) Memory: Impaired Memory  Impairment: Decreased short term memory;Retrieval deficit;Storage deficit Decreased Short Term Memory: Verbal basic;Functional basic Immediate Memory Recall: Sock;Blue;Bed Memory Recall Sock: Not able to recall Memory Recall Blue: Not able to recall Memory Recall Bed: Not able to recall Attention: Focused;Sustained Focused Attention: Appears intact Sustained Attention: Appears intact Selective Attention: Impaired Selective Attention Impairment: Verbal basic Awareness: Impaired Awareness Impairment: Intellectual impairment Problem Solving: Impaired Problem Solving Impairment: Verbal basic;Functional basic Executive Function: Reasoning Reasoning: Impaired Reasoning Impairment: Functional basic Safety/Judgment: Impaired Sensation Sensation Light Touch: Appears Intact Light Touch Impaired Details: Impaired RLE(difficult to assess but appears decreased touch in R LE) Hot/Cold: Appears Intact Proprioception: Appears Intact Stereognosis: Appears Intact Additional Comments: Sensation intact in BUEs Coordination Gross Motor Movements are Fluid and Coordinated: No Fine Motor Movements are Fluid and Coordinated: No Coordination and Movement Description: slight ataxic movement noted with finter to nose testing of the RUE. He was able to use BUEs during selfcare tasks with relative independence. Heel Shin Test: impaired bilaterally due to weakness and generalized pain Motor  Motor Motor: Abnormal postural alignment and control Motor - Skilled Clinical Observations: generalized weakness Mobility  Bed Mobility Bed Mobility: Supine to Sit;Rolling Left;Rolling Right;Sit to Supine Rolling Right: Minimal Assistance - Patient > 75% Rolling Left: Minimal Assistance - Patient > 75% Supine to Sit: Minimal Assistance - Patient > 75% Sit to Supine: Moderate Assistance - Patient 50-74% Transfers Sit to Stand: Moderate Assistance - Patient 50-74% Stand to Sit: Moderate Assistance - Patient 50-74%   Trunk/Postural Assessment  Cervical Assessment Cervical Assessment: Within Functional Limits Thoracic Assessment Thoracic Assessment: Exceptions to WFL(thoracic rounding) Lumbar Assessment Lumbar Assessment: Exceptions to WFL(posterior pelvic tilt) Postural Control Postural Control: Deficits on evaluation Postural Limitations: flexed trunk in standing with increased weightbearing on his heels  Balance Balance Balance Assessed: Yes Static Sitting Balance Static Sitting - Balance Support: Feet supported;Bilateral upper extremity supported Static Sitting - Level of Assistance: 5: Stand by  assistance Dynamic Sitting Balance Dynamic Sitting - Balance Support: During functional activity Dynamic Sitting - Level of Assistance: 4: Min assist Static Standing Balance Static Standing - Balance Support: During functional activity Static Standing - Level of Assistance: 3: Mod assist Dynamic Standing Balance Dynamic Standing - Balance Support: During functional activity Dynamic Standing - Level of Assistance: 2: Max assist Extremity/Trunk Assessment RUE Assessment RUE Assessment: Exceptions to [facility] Active Range of Motion (AROM) Comments: WFLs General Strength Comments: strength 5/5 throughout, slight dysmetria noted with finger to nose testing when comparing to the left. LUE Assessment LUE Assessment: Within Functional Limits     Refer to Care Plan for Long Term Goals  Recommendations for other services: None    Discharge Criteria: Patient will be discharged from OT if patient refuses treatment 3 consecutive times without medical reason, if treatment goals not met, if there is a change in medical status, if patient makes no progress towards goals or if patient is discharged from hospital.  The above assessment, treatment plan, treatment alternatives and goals were discussed and mutually agreed upon: by patient  Thomos Domine  OTR/L 04/04/2019, 4:50 PM

## 2019-04-04 NOTE — Progress Notes (Signed)
[place] PHYSICAL MEDICINE & REHABILITATION PROGRESS NOTE   Subjective/Complaints:  No issues overnite, eating breakfast Mod I   ROS- no swallowing issues, no CP, n SOB, no N/V/D  Objective:   DG Chest 2 View  Result Date: 04/02/2019 CLINICAL DATA:  Acute onset fever today. EXAM: CHEST - 2 VIEW COMPARISON:  Single-view of the chest 03/28/2019. FINDINGS: Right PICC is unchanged with its tip near the superior cavoatrial junction. Lungs are clear. Marked cardiomegaly. No pneumothorax or pleural fluid. No acute or focal bony abnormality. IMPRESSION: No acute disease. Marked cardiomegaly. Electronically Signed   By: Inge Rise M.D.   On: 04/02/2019 11:13   Recent Labs    04/02/19 1100  WBC 9.3  HGB 8.1*  HCT 24.5*  PLT 318   No results for input(s): NA, K, CL, CO2, GLUCOSE, BUN, CREATININE, CALCIUM in the last 72 hours.  Intake/Output Summary (Last 24 hours) at 04/04/2019 [id number] Last data filed at 04/04/2019 0434 Gross per 24 hour  Intake 240 ml  Output 1125 ml  Net -885 ml     Physical Exam: Vital Signs Blood pressure (!) 143/74, pulse 68, temperature 98.9 F (37.2 C), temperature source Oral, resp. rate 19, height 6\' 5"  (1.956 m), weight 110.6 kg, SpO2 100 %.   General: No acute distress Mood and affect are appropriate Heart: Regular rate and rhythm no rubs murmurs or extra sounds Lungs: Clear to auscultation, breathing unlabored, no rales or wheezes Abdomen: Positive bowel sounds, soft nontender to palpation, nondistended Extremities: No clubbing, cyanosis, or edema Skin: No evidence of breakdown, no evidence of rash Neurologic: Cranial nerves II through XII intact, motor strength is 5/5 in bilateral deltoid, bicep, tricep, grip, hip flexor, knee extensors, ankle dorsiflexor and plantar flexor Sensory exam normal sensation to light touch and proprioception in bilateral upper and lower extremities Cerebellar exam normal finger to nose to finger as well as heel to shin  in bilateral upper and lower extremities Musculoskeletal: Full range of motion in all 4 extremities. No joint swelling    Assessment/Plan: 1. Functional deficits secondary to L ACA aneurysm, MCA  Stenosis causing Left hemispheric ischemia which require 3+ hours per day of interdisciplinary therapy in a comprehensive inpatient rehab setting.  Physiatrist is providing close team supervision and 24 hour management of active medical problems listed below.  Physiatrist and rehab team continue to assess barriers to discharge/monitor patient progress toward functional and medical goals  Care Tool:  Bathing              Bathing assist       Upper Body Dressing/Undressing Upper body dressing   What is the patient wearing?: Hospital gown only    Upper body assist      Lower Body Dressing/Undressing Lower body dressing      What is the patient wearing?: Hospital gown only     Lower body assist       Toileting Toileting    Toileting assist Assist for toileting: Moderate Assistance - Patient 50 - 74%     Transfers Chair/bed transfer  Transfers assist     Chair/bed transfer assist level: Moderate Assistance - Patient 50 - 74%     Locomotion Ambulation   Ambulation assist              Walk 10 feet activity   Assist           Walk 50 feet activity   Assist  Walk 150 feet activity   Assist           Walk 10 feet on uneven surface  activity   Assist           Wheelchair     Assist               Wheelchair 50 feet with 2 turns activity    Assist            Wheelchair 150 feet activity     Assist          Blood pressure (!) 143/74, pulse 68, temperature 98.9 F (37.2 C), temperature source Oral, resp. rate 19, height 6\' 5"  (1.956 m), weight 110.6 kg, SpO2 100 %.    Medical Problem List and Plan:  1. Right side weakness with dysphagia secondary to left ACA pericallosal segment aneurysm  S/P stent assisted coiling as well as left ICA proximal stenosis S/P CEA followed by stent angioplasty 03/26/2019 per interventional radiology with post procedure right cerebellar peduncle infarction  -patient may shower if covers IR sites  -ELOS/Goals: 2 weeks; supervision to Mod I  Was Mod I PTA  PT, OT, SLP evals today  2. Antithrombotics:  -DVT/anticoagulation: Eliquis  -antiplatelet therapy: Brilinta  3. Pain Management: Tramadol as needed  4. Mood: Provide emotional support  -antipsychotic agents: N/A  5. Neuropsych: This patient is capable of making decisions on his own behalf.  6. Skin/Wound Care: Routine skin checks  7. Fluids/Electrolytes/Nutrition: Routine in and outs with follow-up chemistries  8. Hypertension. Patient on no home med antihypertensive medications. Monitor with increased mobility  9. Hyperlipidemia. Lipitor  10. Tobacco abuse. Counseling  11. Prediabetes. Hemoglobin A1c 6.4. SSI. Patient on no diabetic agents prior to admission. Well controlled no meds CBG (last 3)  Recent Labs    04/03/19 1707 04/03/19 2120 04/04/19 0619  GLUCAP 85 115* 109*    12. BPH /UTI. Flomax 0.4 mg daily. Urine culture 03/28/2019 no growth. Urine study 03/29/2019 + nitrite. Empiric Levaquin x5 doses completed.  -per note, had replaced the foley due to urinary retention? Voiding trial once more mobile  13. Hypothyroidism. TSH 45! Synthroid started. Patient will need follow-up thyroid panel in 3 months  14. Per chart, severe PAD with R foot ischemia- has LE foot/ankle pain on exam that is likely c/w PAD- will monitor closely for skin breakdown, etc.      LOS: 1 days A FACE TO FACE EVALUATION WAS PERFORMED  Charlett Blake 04/04/2019, 8:22 AM

## 2019-04-05 ENCOUNTER — Inpatient Hospital Stay (HOSPITAL_COMMUNITY): Payer: BC Managed Care – PPO | Admitting: Speech Pathology

## 2019-04-05 ENCOUNTER — Inpatient Hospital Stay (HOSPITAL_COMMUNITY): Payer: BC Managed Care – PPO

## 2019-04-05 ENCOUNTER — Inpatient Hospital Stay (HOSPITAL_COMMUNITY): Payer: BC Managed Care – PPO | Admitting: Physical Therapy

## 2019-04-05 DIAGNOSIS — I2510 Atherosclerotic heart disease of native coronary artery without angina pectoris: Secondary | ICD-10-CM

## 2019-04-05 DIAGNOSIS — N179 Acute kidney failure, unspecified: Secondary | ICD-10-CM

## 2019-04-05 DIAGNOSIS — I63519 Cerebral infarction due to unspecified occlusion or stenosis of unspecified middle cerebral artery: Secondary | ICD-10-CM

## 2019-04-05 DIAGNOSIS — I69391 Dysphagia following cerebral infarction: Secondary | ICD-10-CM

## 2019-04-05 LAB — GLUCOSE, CAPILLARY
Glucose-Capillary: 93 mg/dL (ref 70–99)
Glucose-Capillary: 92 mg/dL (ref 70–99)

## 2019-04-05 NOTE — Progress Notes (Signed)
Speech Language Pathology Daily Session Note  Patient Details  Name: Jeremy Sherman MRN: [id number] Date of Birth: [birth year]  Today's Date: 04/05/2019 SLP Individual Time: [id number] SLP Individual Time Calculation (min): 40 min  Short Term Goals: Week 1: SLP Short Term Goal 1 (Week 1): Pt will consume current diet with minimal overt s/sx aspiration and demonstrate efficient mastication and oral clearance with Min A verbal cues for use of safe swallow strategies. SLP Short Term Goal 2 (Week 1): Pt will consume trials of Dys 3 solids and demonstrate efficient mastication and oral clearance X3 prior to upgrade. SLP Short Term Goal 3 (Week 1): Pt will demonstrate ability to problem solve during functional familiar tasks with Min A verbal/visual cues. SLP Short Term Goal 4 (Week 1): Pt will selectively attend to tasks with Min A verbal/verbal cues for redirection. SLP Short Term Goal 5 (Week 1): Pt will recall new and daily information with Mod A verbal/visual cues for use of compensatory strategies. SLP Short Term Goal 6 (Week 1): Pt will demonstrate intellectual awareness by stating 1 physical and 1 cognitive deficit with Mod A verbal/visual cues.  Skilled Therapeutic Interventions:  Pt was seen for skilled ST targeting goals for dysphagia and cognition.  Pt was eating lunch upon therapist's arrival but appeared to have mostly eaten pureed textures.  When offered more solid consistencies, pt politely declined but was agreeable to eating applesauce, milk, and apple juice.  Pt consumed thin liquids and pureed textures with supervision cues for use of swallowing precautions and no overt s/s of aspiration.  After completion of meal, SLP facilitated the session with a novel card game to address goals ro attention, problem solving, and recall.  Pt needed overall max assist to complete the task due to decreased recall of task rules and procedures but he sustained his attention to task for ~10  minute intervals with no cues needed for redirection.  Pt was oriented to month independently with the use of a calendar posted in his room but stated that today was the second.  SLP updated pt's calendar and drew pt's attention to it to facilitate carryover of orientation information.  Pt was left in wheelchair with chair alarm set and call bell within reach.  Continue per current plan of care.    Pain Pain Assessment Pain Scale: 0-10 Pain Score: 0-No pain  Therapy/Group: Individual Therapy  Waverly Tarquinio, Selinda Orion 04/05/2019, 1:46 PM

## 2019-04-05 NOTE — Progress Notes (Signed)
Occupational Therapy Session Note  Patient Details  Name: Jeremy Sherman MRN: [id number] Date of Birth: 04-13-1950  Today's Date: 04/05/2019 OT Individual Time: 1430-1530 OT Individual Time Calculation (min): 60 min    Short Term Goals: Week 1:  OT Short Term Goal 1 (Week 1): Pt will complete LB bathing sit to stand with AE and min instructional cueing. OT Short Term Goal 2 (Week 1): Pt will complete LB dressing sit to stand with mod assist and AE PRN. OT Short Term Goal 3 (Week 1): Pt will complete toilet transfer with min assist using the RW for support. OT Short Term Goal 4 (Week 1): Pt will complete walk-in shower transfer with min assist and use of the RW for support.  Skilled Therapeutic Interventions/Progress Updates:    Pt received sitting up in w/c with no c/o pain. Pt oriented to place, not time. Dried blood from nose present, pt able to clean with washcloth. BP assessed in sitting- 122/91. Pt's nose began bleeding lightly several more times during session- RN aware. Pt completed ADLs at the sink. Moderate cueing rueqired throughout session for alertness and attention to task. Pt doffed/donned shirt with min A. Cueing for sequencing required. Pt completed sit <> stand at the sink with mod A. Once standing, pt required min A for standing balance. Mod A for peri hygiene in standing with smear of BM. 700 ml from foley emptied- dark urine. Max A to don pants. Pt reported need to have BM and he completed a stand pivot transfer with the RW to the [facility] with mod A. Mod cueing overall for RW management and sequencing transfer. Pt voided BM and required max A for hygiene. Pt used RW and took 5 steps to the bed with mod A, max cueing for RW management. Pt transferred back to supine in bed with min A. Pt left supine with all needs met, bed alarm set. BP supine: 124/64  Therapy Documentation Precautions:  Precautions Precautions: Fall Precaution Comments: monitor BP Restrictions Weight  Bearing Restrictions: No   Therapy/Group: Individual Therapy  Curtis Sites 04/05/2019, 7:32 AM

## 2019-04-05 NOTE — Progress Notes (Signed)
[place] PHYSICAL MEDICINE & REHABILITATION PROGRESS NOTE   Subjective/Complaints:  Pt reports plans on eating- has been asleep, but admits PLANS on eating- breakfast untouched- very sleepy; but appropriate.  Nursing asking if can reduce BG checks since BGs all <130- will make BID   ROS- no swallowing issues, no CP, n SOB, no N/V/D  Objective:   No results found. Recent Labs    04/04/19 0750  WBC 8.8  HGB 7.8*  HCT 23.9*  PLT 344   Recent Labs    04/04/19 0750  NA 135  K 3.8  CL 103  CO2 24  GLUCOSE 103*  BUN 12  CREATININE 1.15  CALCIUM 8.3*    Intake/Output Summary (Last 24 hours) at 04/05/2019 1418 Last data filed at 04/04/2019 1716 Gross per 24 hour  Intake -  Output 550 ml  Net -550 ml     Physical Exam: Vital Signs Blood pressure (!) 141/66, pulse 69, temperature 99.4 F (37.4 C), resp. rate 14, height 6\' 5"  (1.956 m), weight 110.6 kg, SpO2 100 %.   General: No acute distress; laying in bed; sleepy; appropriate; asleep initially, but woke to verbal stimuli; NAD; very tall Mood and affect are appropriate Heart: Regular rate and rhythm no rubs murmurs or extra sounds Lungs: Clear to auscultation, breathing unlabored, no rales or wheezes Abdomen: Positive bowel sounds, soft nontender to palpation, nondistended Extremities: No clubbing, cyanosis, or edema Skin: No evidence of breakdown, no evidence of rash Neurologic:, motor strength is 5/5 in bilateral deltoid, bicep, tricep, grip, hip flexor, knee extensors, ankle dorsiflexor and plantar flexor Sensory exam normal sensation to light touch and proprioception in bilateral upper and lower extremities Cerebellar exam normal finger to nose to finger as well as heel to shin in bilateral upper and lower extremities Musculoskeletal: Full range of motion in all 4 extremities. No joint swelling    Assessment/Plan: 1. Functional deficits secondary to L ACA aneurysm, MCA  Stenosis causing Left hemispheric  ischemia which require 3+ hours per day of interdisciplinary therapy in a comprehensive inpatient rehab setting.  Physiatrist is providing close team supervision and 24 hour management of active medical problems listed below.  Physiatrist and rehab team continue to assess barriers to discharge/monitor patient progress toward functional and medical goals  Care Tool:  Bathing    Body parts bathed by patient: Right arm, Left arm, Chest, Abdomen, Right upper leg, Left upper leg, Face   Body parts bathed by helper: Front perineal area, Buttocks, Right lower leg, Left lower leg     Bathing assist Assist Level: Moderate Assistance - Patient 50 - 74%     Upper Body Dressing/Undressing Upper body dressing   What is the patient wearing?: Pull over shirt    Upper body assist Assist Level: Moderate Assistance - Patient 50 - 74%    Lower Body Dressing/Undressing Lower body dressing      What is the patient wearing?: Pants, Incontinence brief     Lower body assist Assist for lower body dressing: Maximal Assistance - Patient 25 - 49%     Toileting Toileting    Toileting assist Assist for toileting: Maximal Assistance - Patient 25 - 49%     Transfers Chair/bed transfer  Transfers assist     Chair/bed transfer assist level: Moderate Assistance - Patient 50 - 74%     Locomotion Ambulation   Ambulation assist   Ambulation activity did not occur: Safety/medical concerns  Assist level: 2 helpers(+2 w/c follow for safety) Assistive device:  Walker-rolling Max distance: 43ft   Walk 10 feet activity   Assist  Walk 10 feet activity did not occur: Safety/medical concerns        Walk 50 feet activity   Assist Walk 50 feet with 2 turns activity did not occur: Safety/medical concerns         Walk 150 feet activity   Assist Walk 150 feet activity did not occur: Safety/medical concerns         Walk 10 feet on uneven surface  activity   Assist Walk 10 feet on  uneven surfaces activity did not occur: Safety/medical concerns         Wheelchair     Assist Will patient use wheelchair at discharge?: (TBD)             Wheelchair 50 feet with 2 turns activity    Assist            Wheelchair 150 feet activity     Assist          Blood pressure (!) 141/66, pulse 69, temperature 99.4 F (37.4 C), resp. rate 14, height 6\' 5"  (1.956 m), weight 110.6 kg, SpO2 100 %.    Medical Problem List and Plan:  1. 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 Antithrombotics:  -DVT/anticoagulation: Eliquis  -antiplatelet therapy: Brilinta  3. Pain Management: Tramadol as needed  4. Mood: Provide emotional support  -antipsychotic agents: N/A  5. Neuropsych: This patient is capable of making decisions on his own behalf.  6. Skin/Wound Care: Routine skin checks  7. Fluids/Electrolytes/Nutrition: Routine in and outs with follow-up chemistries  8. Hypertension. Patient on no home med antihypertensive medications. Monitor with increased mobility  9. Hyperlipidemia. Lipitor  10. Tobacco abuse. Counseling  11. Prediabetes. Hemoglobin A1c 6.4. SSI. Patient on no diabetic agents prior to admission. Well controlled no meds CBG (last 3)  Recent Labs    04/04/19 1702 04/04/19 2056 04/05/19 0631  GLUCAP 117* 102* 93   2/6- change BG checks to BID  12. BPH /UTI. Flomax 0.4 mg daily. Urine culture 03/28/2019 no growth. Urine study 03/29/2019 + nitrite. Empiric Levaquin x5 doses completed.  -per note, had replaced the foley due to urinary retention? Voiding trial once more mobile   13. Hypothyroidism. TSH 45! Synthroid started. Patient will need follow-up thyroid panel in 3 months  14. Per chart, severe PAD with R foot ischemia- has LE foot/ankle pain on exam that is likely c/w PAD- will monitor closely for skin breakdown, etc.      LOS: 2 days A FACE TO FACE EVALUATION WAS PERFORMED  Shawndrea Rutkowski 04/05/2019, 2:18 PM

## 2019-04-05 NOTE — Progress Notes (Signed)
Physical Therapy Session Note  Patient Details  Name: Jeremy Sherman MRN: [id number] Date of Birth: Sep 11, 1950  Today's Date: 04/05/2019 PT Individual Time: 0952-1110 PT Individual Time Calculation (min): 78 min   Short Term Goals: Week 1:  PT Short Term Goal 1 (Week 1): Pt will consistenly perform sit<>stands using LRAD with mod assist PT Short Term Goal 2 (Week 1): Pt will consistently perform stand pivot transfers using LRAD with mod assist PT Short Term Goal 3 (Week 1): Pt will ambulate at least 51ft using LRAD with mod assist PT Short Term Goal 4 (Week 1): Pt will initiate stair training  Skilled Therapeutic Interventions/Progress Updates:    Pt received asleep, supine in bed but easily awakens to verbal stimulus and agreeable to therapy session. Pt reports being incontinent of bowels - educated on use of call bell to notify nursing staff for assistance to perform hygiene if this occurs. Rolling R/L in bed with multimodal cuing for sequencing and use of bedrails with min assist while therapist performed total assist LB clothing management and peri-care. Supine in bed donned clean pants with max assist - cuing for threading LEs and to assist with pulling pants over hips. Supine vitals: BP 141/66 (MAP 89), HR 69bpm Sit>supine with mod assist for B LE management and trunk upright with max multimodal cuing for sequencing of logroll technique to increase pt independence. Sitting vitals: BP 121/87 (MAP 99), HR 89bpm with pt complaining of lightheadedness - therapist encouraged increased fluid intake. Pt had not eaten breakfast and requesting to eat - while participating in unsupported sitting EOB pt engaged in eating task with therapist encouraging use of both hands to perform bimanual tasks rather than having therapist assist to further challenge his balance - pt only ate ~6 bites total. Reassessed vitals in sitting ~40minutes later: BP 133/70 (MAP 91), HR 83bpm Sit>stand elevated EOB>RW  with mod assist for lifting into standing. Gait training ~73ft using RW with min assist for balance and +2 for w/c follow - pt suddenly reports feeling "funny" so provided seated rest break in w/c then pt reports need to have another BM. Therapist retrieved BSC. Stand pivot BSC<>w/c using RW with mod assist for coming to stand and then min assist for balance while turning - max cuing for sequencing of AD and LE stepping. Standing with B UE support on RW while therapist performed total assist LB clothing management - pt had already been incontient of BM prior to getting to [facility] and despite opportunity to void further on BSC pt unable - standing with CGA therapist performed total assist peri-care. Pt left seated in w/c with needs in reach and seat belt alarm on.  Therapy Documentation Precautions:  Precautions Precautions: Fall Precaution Comments: monitor BP Restrictions Weight Bearing Restrictions: No  Pain:   Pt frequently moans/grunts throughout session but when asked if he has pain he continues to deny it.   Therapy/Group: Individual Therapy  Tawana Scale, PT, DPT 04/05/2019, 7:58 AM

## 2019-04-06 ENCOUNTER — Inpatient Hospital Stay (HOSPITAL_COMMUNITY): Payer: BC Managed Care – PPO | Admitting: Occupational Therapy

## 2019-04-06 ENCOUNTER — Inpatient Hospital Stay (HOSPITAL_COMMUNITY): Payer: BC Managed Care – PPO

## 2019-04-06 LAB — CULTURE, BLOOD (ROUTINE X 2)
Culture: NO GROWTH
Culture: NO GROWTH
Special Requests: ADEQUATE
Special Requests: ADEQUATE

## 2019-04-06 LAB — GLUCOSE, CAPILLARY: Glucose-Capillary: 84 mg/dL (ref 70–99)

## 2019-04-06 NOTE — Progress Notes (Signed)
Occupational Therapy Session Note  Patient Details  Name: Jeremy Sherman MRN: [id number] Date of Birth: 1950/03/22  Today's Date: 04/06/2019 OT Individual Time: [id number] OT Individual Time Calculation (min): 71 min  Short Term Goals: Week 1:  OT Short Term Goal 1 (Week 1): Pt will complete LB bathing sit to stand with AE and min instructional cueing. OT Short Term Goal 2 (Week 1): Pt will complete LB dressing sit to stand with mod assist and AE PRN. OT Short Term Goal 3 (Week 1): Pt will complete toilet transfer with min assist using the RW for support. OT Short Term Goal 4 (Week 1): Pt will complete walk-in shower transfer with min assist and use of the RW for support.  Skilled Therapeutic Interventions/Progress Updates:    Pt greeted in bed, asleep, easily woken. He was initially refusing therapy, breakfast was here but he didn't want to eat yet. Pt was agreeable to shower. Supine<sit completed with Mod A and Mod A for stand pivot<w/c using RW once bed was elevated. TTB was adjusted to highest possible height. Mod A for stand pivot<TTB with use of grab bars. Note that he needed to use the vertical(!) grab bar for all power ups from TTB (Mod A sit<stand). Mod A for bathing tasks overall, lateral leans utilized for perihygiene and OT also washed his feet. He needed vcs for sequencing. Mod A for stand pivot back to w/c to proceed with dressing tasks. Max A for sit<stands at the sink and Max A for LB dressing tasks overall with limited LE flexibility bilaterally. Mod A for donning overhead shirt. He then completed oral care and hand washing while seated, vcs once again for motor planning with pt attempting to cap his toothpaste with a water bottle. He was agreeable to remain up in the w/c to eat his breakfast. Pt set up to eat, notified NT to provide intermittent supervision. Left him with all needs and safety belt fastened. Tx focus placed on ADL retraining, sit<stands, praxis, activity  tolerance, and balance.    Pt denied dizziness during sesssion  Therapy Documentation Precautions:  Precautions Precautions: Fall Precaution Comments: monitor BP Restrictions Weight Bearing Restrictions: No Vital Signs: Therapy Vitals Pulse Rate: 83 BP: 118/67(after gait training) Patient Position (if appropriate): Sitting Pain: Pt often groaning and moaning with movement but denies pain. Noted some hypersensitivity with water temperatures with settings adjusted to his preference during shower Pain Assessment Pain Scale: 0-10 Pain Score: 0-No pain ADL: ADL Eating: Set up Where Assessed-Eating: Bed level Grooming: Supervision/safety Where Assessed-Grooming: Edge of bed Upper Body Bathing: Supervision/safety Where Assessed-Upper Body Bathing: Edge of bed Lower Body Bathing: Moderate assistance Where Assessed-Lower Body Bathing: Edge of bed Upper Body Dressing: Maximal assistance Where Assessed-Upper Body Dressing: Bed level Lower Body Dressing: Maximal assistance Where Assessed-Lower Body Dressing: Edge of bed      Therapy/Group: Individual Therapy  Ilissa Rosner A Sukari Grist 04/06/2019, 12:13 PM

## 2019-04-06 NOTE — Plan of Care (Signed)
  Problem: Consults Goal: RH STROKE PATIENT EDUCATION Description: See Patient Education module for education specifics  Outcome: Progressing   Problem: RH BOWEL ELIMINATION Goal: RH STG MANAGE BOWEL WITH ASSISTANCE Description: STG Manage Bowel with mod I Assistance. Outcome: Progressing   Problem: RH BLADDER ELIMINATION Goal: RH STG MANAGE BLADDER WITH ASSISTANCE Description: STG Manage Bladder With Mod I Assistance Outcome: Progressing   Problem: RH SKIN INTEGRITY Goal: RH STG MAINTAIN SKIN INTEGRITY WITH ASSISTANCE Description: STG Maintain Skin Integrity With Mod I Assistance. Outcome: Progressing   Problem: RH SAFETY Goal: RH STG ADHERE TO SAFETY PRECAUTIONS W/ASSISTANCE/DEVICE Description: STG Adhere to Safety Precautions With cues and reminders  Outcome: Progressing   Problem: RH PAIN MANAGEMENT Goal: RH STG PAIN MANAGED AT OR BELOW PT'S PAIN GOAL Description: Pain level less that 4 on scale of 0-10 Outcome: Progressing   Problem: RH KNOWLEDGE DEFICIT Goal: RH STG INCREASE KNOWLEDGE OF HYPERTENSION Description: Pt will be able to adhere to medication regimen, dietary and lifestyle modification to control blood glucose and hyperlipidemia with mod I assist upon discharge.  Outcome: Progressing Goal: RH STG INCREASE KNOWLEGDE OF HYPERLIPIDEMIA Description: Pt will be able to adhere to medication regimen, dietary and lifestyle modification to control blood glucose and hyperlipidemia with mod I assist upon discharge.  Outcome: Progressing Goal: RH STG INCREASE KNOWLEDGE OF STROKE PROPHYLAXIS Description: Pt will be able to adhere to medication regimen, dietary and lifestyle modification to control blood glucose and hyperlipidemia with mod I assist upon discharge.  Outcome: Progressing

## 2019-04-06 NOTE — Progress Notes (Signed)
Physical Therapy Session Note  Patient Details  Name: Jeremy Sherman MRN: [id number] Date of Birth: 1950/06/27  Today's Date: 04/06/2019 PT Individual Time: 1000-1058 PT Individual Time Calculation (min): 58 min    Short Term Goals: Week 1:  PT Short Term Goal 1 (Week 1): Pt will consistenly perform sit<>stands using LRAD with mod assist PT Short Term Goal 2 (Week 1): Pt will consistently perform stand pivot transfers using LRAD with mod assist PT Short Term Goal 3 (Week 1): Pt will ambulate at least 24ft using LRAD with mod assist PT Short Term Goal 4 (Week 1): Pt will initiate stair training  Skilled Therapeutic Interventions/Progress Updates:    Patient seated in w/c upon PT arrival, agreeable to therapy tx, denies pain when asked but moans/groans throughout therapy. Assessed orthostatics: in sitting pt's BP = 128/81 mmHg HR = 68 bpm; sit > stand modA with cues to push up with BUE from arm rests, foot positioning, upright posture, and assist to come up into standing; in standing BP = 116/67 mmHg HR = 97 bpm, no reports of lightheadedness or dizziness. Pt transported to rehab gym in w/c. Pt performed 1-step up at stairs with B rails x3 reps with prolonged seated rest in between, modA for sit>stand and for step negotiation with weight shifting assist and cues for sequencing. Pt requires cueing for sequencing and hand placement during sit<>stand transfers. Pt ambulated 29.5' with RW minA+2 for safety and w/c follow + 20' modA+2. Pt demonstrated difficulty weight shifting to the L requiring max verbal and tactile cueing. Pt required verbal stimulation throughout session to keep attention with cues to keep eyes open and attentive to task but able to follow commands. Pt transported in w/c back to room at end of session. Performed stand pivot transfer from w/c>bed maxA with max cues for sequencing and safety. Pt sit>supine in bed minA for LE management. Min-modA for positioning in bed, pt able  to scoot hips and bring LE laterally to L requiring assist and cues for positioning. Assessed LE PROM, pt demonstrated muscular restriction on L>R in HS and gastroc, pt increased moans/groans but stated no pain. Pt performed x2 reps of bridging with minA for foot positioning and verbal cues able to clear buttock 1-2" from bed. Pt left supine in bed with needs in reach and bed alarm set.     Therapy Documentation Precautions:  Precautions Precautions: Fall Precaution Comments: monitor BP Restrictions Weight Bearing Restrictions: No    Therapy/Group: Individual Therapy  Juliann Pulse SPT 04/06/2019, 7:53 AM

## 2019-04-06 NOTE — Progress Notes (Signed)
[place] PHYSICAL MEDICINE & REHABILITATION PROGRESS NOTE   Subjective/Complaints:  Pt sleeping in front of breakfast- woke easily- barely had touched tray- said "planned on eating more", but as I left, staff came to take his tray- ate <25%   ROS- no swallowing issues, no CP, n SOB, no N/V/D  Objective:   No results found. Recent Labs    04/04/19 0750  WBC 8.8  HGB 7.8*  HCT 23.9*  PLT 344   Recent Labs    04/04/19 0750  NA 135  K 3.8  CL 103  CO2 24  GLUCOSE 103*  BUN 12  CREATININE 1.15  CALCIUM 8.3*    Intake/Output Summary (Last 24 hours) at 04/06/2019 1013 Last data filed at 04/06/2019 0830 Gross per 24 hour  Intake -  Output 1500 ml  Net -1500 ml     Physical Exam: Vital Signs Blood pressure 127/62, pulse 66, temperature 98.4 F (36.9 C), resp. rate 16, height 6\' 5"  (1.956 m), weight 110.6 kg, SpO2 100 %.   General: No acute distress;sleepy; appropriate; asleep initially, but woke to verbal stimuli; NAD; very tall- sitting up in bedside chair Mood and affect are appropriate Heart: Regular rate and rhythm no rubs murmurs or extra sounds Lungs: Clear to auscultation, breathing unlabored, no rales or wheezes Abdomen: Positive bowel sounds, soft nontender to palpation, nondistended Extremities: No clubbing, cyanosis, or edema Skin: No evidence of breakdown, no evidence of rash Neurologic:, motor strength is 5/5 in bilateral deltoid, bicep, tricep, grip, hip flexor, knee extensors, ankle dorsiflexor and plantar flexor Sensory exam normal sensation to light touch and proprioception in bilateral upper and lower extremities Cerebellar exam normal finger to nose to finger as well as heel to shin in bilateral upper and lower extremities Musculoskeletal: Full range of motion in all 4 extremities. No joint swelling    Assessment/Plan: 1. Functional deficits secondary to L ACA aneurysm, MCA  Stenosis causing Left hemispheric ischemia which require 3+ hours per  day of interdisciplinary therapy in a comprehensive inpatient rehab setting.  Physiatrist is providing close team supervision and 24 hour management of active medical problems listed below.  Physiatrist and rehab team continue to assess barriers to discharge/monitor patient progress toward functional and medical goals  Care Tool:  Bathing    Body parts bathed by patient: Right arm, Left arm, Chest, Abdomen, Right upper leg, Left upper leg, Face, Front perineal area   Body parts bathed by helper: Buttocks, Right lower leg, Left lower leg     Bathing assist Assist Level: Moderate Assistance - Patient 50 - 74%     Upper Body Dressing/Undressing Upper body dressing   What is the patient wearing?: Pull over shirt    Upper body assist Assist Level: Moderate Assistance - Patient 50 - 74%    Lower Body Dressing/Undressing Lower body dressing      What is the patient wearing?: Pants, Incontinence brief     Lower body assist Assist for lower body dressing: Maximal Assistance - Patient 25 - 49%     Toileting Toileting    Toileting assist Assist for toileting: Maximal Assistance - Patient 25 - 49%     Transfers Chair/bed transfer  Transfers assist     Chair/bed transfer assist level: Moderate Assistance - Patient 50 - 74%     Locomotion Ambulation   Ambulation assist   Ambulation activity did not occur: Safety/medical concerns  Assist level: 2 helpers(+2 w/c follow for safety) Assistive device: Walker-rolling Max distance: 27ft  Walk 10 feet activity   Assist  Walk 10 feet activity did not occur: Safety/medical concerns        Walk 50 feet activity   Assist Walk 50 feet with 2 turns activity did not occur: Safety/medical concerns         Walk 150 feet activity   Assist Walk 150 feet activity did not occur: Safety/medical concerns         Walk 10 feet on uneven surface  activity   Assist Walk 10 feet on uneven surfaces activity did not  occur: Safety/medical concerns         Wheelchair     Assist Will patient use wheelchair at discharge?: (TBD)             Wheelchair 50 feet with 2 turns activity    Assist            Wheelchair 150 feet activity     Assist          Blood pressure 127/62, pulse 66, temperature 98.4 F (36.9 C), resp. rate 16, height 6\' 5"  (1.956 m), weight 110.6 kg, SpO2 100 %.    Medical Problem List and Plan:  1. 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 Antithrombotics:  -DVT/anticoagulation: Eliquis  -antiplatelet therapy: Brilinta  3. Pain Management: Tramadol as needed  4. Mood: Provide emotional support  -antipsychotic agents: N/A  5. Neuropsych: This patient is capable of making decisions on his own behalf.  6. Skin/Wound Care: Routine skin checks  7. Fluids/Electrolytes/Nutrition: Routine in and outs with follow-up chemistries  8. Hypertension. Patient on no home med antihypertensive medications. Monitor with increased mobility  9. Hyperlipidemia. Lipitor  10. Tobacco abuse. Counseling  11. Prediabetes. Hemoglobin A1c 6.4. SSI. Patient on no diabetic agents prior to admission. Well controlled no meds CBG (last 3)  Recent Labs    04/05/19 0631 04/05/19 1735 04/06/19 0628  GLUCAP 93 92 84   2/6- change BG checks to BID  12. BPH /UTI. Flomax 0.4 mg daily. Urine culture 03/28/2019 no growth. Urine study 03/29/2019 + nitrite. Empiric Levaquin x5 doses completed.  -per note, had replaced the foley due to urinary retention? Voiding trial once more mobile  13. Hypothyroidism. TSH 45! Synthroid  started. Patient will need follow-up thyroid panel in 3 months  2/7- likely reason sleeping so much.   46. Per chart, severe PAD with R foot ischemia- has LE foot/ankle pain on exam that is likely c/w PAD- will monitor closely for skin breakdown, etc.      LOS: 3 days A FACE TO FACE EVALUATION WAS PERFORMED  Edie Vallandingham 04/06/2019, 10:13 AM

## 2019-04-07 ENCOUNTER — Inpatient Hospital Stay (HOSPITAL_COMMUNITY): Payer: BC Managed Care – PPO | Admitting: Speech Pathology

## 2019-04-07 ENCOUNTER — Inpatient Hospital Stay (HOSPITAL_COMMUNITY): Payer: BC Managed Care – PPO

## 2019-04-07 ENCOUNTER — Inpatient Hospital Stay (HOSPITAL_COMMUNITY): Payer: BC Managed Care – PPO | Admitting: Occupational Therapy

## 2019-04-07 LAB — GLUCOSE, CAPILLARY
Glucose-Capillary: 100 mg/dL — ABNORMAL HIGH (ref 70–99)
Glucose-Capillary: 98 mg/dL (ref 70–99)
Glucose-Capillary: 107 mg/dL — ABNORMAL HIGH (ref 70–99)

## 2019-04-07 NOTE — Progress Notes (Signed)
Foley catheter removed as ordered at approximately 1325.   Patient tolerated well.  8 cc clear liquid removed from balloon

## 2019-04-07 NOTE — Progress Notes (Signed)
Occupational Therapy Session Note  Patient Details  Name: Jeremy Sherman MRN: [id number] Date of Birth: [birth year]  Today's Date: 04/07/2019 OT Individual Time: [id number] OT Individual Time Calculation (min): 56 min    Short Term Goals: Week 1:  OT Short Term Goal 1 (Week 1): Pt will complete LB bathing sit to stand with AE and min instructional cueing. OT Short Term Goal 2 (Week 1): Pt will complete LB dressing sit to stand with mod assist and AE PRN. OT Short Term Goal 3 (Week 1): Pt will complete toilet transfer with min assist using the RW for support. OT Short Term Goal 4 (Week 1): Pt will complete walk-in shower transfer with min assist and use of the RW for support.  Skilled Therapeutic Interventions/Progress Updates:    Pt was able to transfer from supine to sit EOB with min guard assist.  He then completed stand pivot from the bed to the wheelchair with mod assist using the RW for support.  Once in the wheelchair, had pt focus on selfcare tasks at the sink.  He was able to doff his shirt with supervision and then completed UB bathing at the same level.  Mod assist for donning new pullover shirt secondary to fatigue and placing the LUE through the neck opening.  He needed mod assist for sit to stand when removing dirty brief and paper scrub pants, with max assist to remove them from his feet as well as for removal of gripper socks.  He needed max assist for washing buttocks and peri area with some bowel incontinence noted on the towel in his chair.  Pt reported needing to go to the bathroom and bed pan was placed in the wheelchair, but pt was only able to go a small amount.  Throughout session as fatigue set in pt would perform lots of moaning, but reported not being in pain.  He did however demonstrate pain when therapist was assisting with donning gripper socks.  Max assist for donning new brief as well as pants to complete session.  Pt left up in the wheelchair with safety belt  and call button in reach in preparation for lunch.    Therapy Documentation Precautions:  Precautions Precautions: Fall Precaution Comments: monitor BP Restrictions Weight Bearing Restrictions: No  Pain: Pain Assessment Pain Scale: Faces Pain Score: 0-No pain Faces Pain Scale: Hurts a little bit Pain Type: Acute pain Pain Location: Foot Pain Orientation: Right;Left Pain Descriptors / Indicators: Discomfort;Grimacing Pain Onset: With Activity Pain Intervention(s): Repositioned;Emotional support ADL: See Care Tool Section for some details of mobility and selfcare tasks  Therapy/Group: Individual Therapy  Layn Kye OTR/L 04/07/2019, 12:34 PM

## 2019-04-07 NOTE — Progress Notes (Signed)
Speech Language Pathology Daily Session Note  Patient Details  Name: Terik Dacanay MRN: [id number] Date of Birth: [birth year]  Today's Date: 04/07/2019 SLP Individual Time: 0901-1000 SLP Individual Time Calculation (min): 59 min  Short Term Goals: Week 1: SLP Short Term Goal 1 (Week 1): Pt will consume current diet with minimal overt s/sx aspiration and demonstrate efficient mastication and oral clearance with Min A verbal cues for use of safe swallow strategies. SLP Short Term Goal 2 (Week 1): Pt will consume trials of Dys 3 solids and demonstrate efficient mastication and oral clearance X3 prior to upgrade. SLP Short Term Goal 3 (Week 1): Pt will demonstrate ability to problem solve during functional familiar tasks with Min A verbal/visual cues. SLP Short Term Goal 4 (Week 1): Pt will selectively attend to tasks with Min A verbal/verbal cues for redirection. SLP Short Term Goal 5 (Week 1): Pt will recall new and daily information with Mod A verbal/visual cues for use of compensatory strategies. SLP Short Term Goal 6 (Week 1): Pt will demonstrate intellectual awareness by stating 1 physical and 1 cognitive deficit with Mod A verbal/visual cues.  Skilled Therapeutic Interventions: Pt was seen for skilled ST targeting dysphagia and cognitive goals. Pt found semi-reclined in bed with TV on and breakfast in front of him. Since he appeared unable to attend to PO intake with external distractions such as TV, SLP provided assistance turning it off and repositioning pt to sitting fully upright - education provided for optimal positioning for PO intake. Pt reported lack of appetite but consumed ~1/2 puree breakfast items (cream of wheat), during which 1 immediate cough noted, suspect due to initiation of conversation prior to swallow. During trial of upgraded Dys 3 (mech soft) solid snack, pt's mastication was slightly prolonged however he did not fatigue throughout consumption of entire snack and  achieved total oral clearance with Supervision A verbal cues for use of lingual wash. No overt s/sx aspiration observed with Dys 3 or thins. Continue current diet but ST will continue to provide trials to work toward solid advancement.  SLP further facilitated session with overall Mod A verbal and visual cues to total change, followed by overall Max A verbal and visual cues for problem solving, error awareness, and organization in order to add or subtract change, and display set amounts of change (basic money management task from [facility]). Pt with good insight into level of difficulty this task presented, as when questioned by SLP he reported task was much more difficult than he expected it to be. Sustained attention to task was excellent in controlled environment with no distractions. Pt left laying in bed with alarm set and needs within reach. Continue per current plan of care.       Pain Pain Assessment Pain Scale: 0-10 Pain Score: 0-No pain  Therapy/Group: Individual Therapy  Arbutus Leas 04/07/2019, 11:13 AM

## 2019-04-07 NOTE — Plan of Care (Signed)
  Problem: Consults Goal: RH STROKE PATIENT EDUCATION Description: See Patient Education module for education specifics  Outcome: Progressing   Problem: RH BOWEL ELIMINATION Goal: RH STG MANAGE BOWEL WITH ASSISTANCE Description: STG Manage Bowel with mod I Assistance. Outcome: Progressing   Problem: RH BLADDER ELIMINATION Goal: RH STG MANAGE BLADDER WITH ASSISTANCE Description: STG Manage Bladder With Mod I Assistance Outcome: Progressing   Problem: RH SKIN INTEGRITY Goal: RH STG MAINTAIN SKIN INTEGRITY WITH ASSISTANCE Description: STG Maintain Skin Integrity With Mod I Assistance. Outcome: Progressing   Problem: RH SAFETY Goal: RH STG ADHERE TO SAFETY PRECAUTIONS W/ASSISTANCE/DEVICE Description: STG Adhere to Safety Precautions With cues and reminders  Outcome: Progressing   Problem: RH PAIN MANAGEMENT Goal: RH STG PAIN MANAGED AT OR BELOW PT'S PAIN GOAL Description: Pain level less that 4 on scale of 0-10 Outcome: Progressing   Problem: RH KNOWLEDGE DEFICIT Goal: RH STG INCREASE KNOWLEDGE OF HYPERTENSION Description: Pt will be able to adhere to medication regimen, dietary and lifestyle modification to control blood glucose and hyperlipidemia with mod I assist upon discharge.  Outcome: Progressing Goal: RH STG INCREASE KNOWLEGDE OF HYPERLIPIDEMIA Description: Pt will be able to adhere to medication regimen, dietary and lifestyle modification to control blood glucose and hyperlipidemia with mod I assist upon discharge.  Outcome: Progressing Goal: RH STG INCREASE KNOWLEDGE OF STROKE PROPHYLAXIS Description: Pt will be able to adhere to medication regimen, dietary and lifestyle modification to control blood glucose and hyperlipidemia with mod I assist upon discharge.  Outcome: Progressing

## 2019-04-07 NOTE — Progress Notes (Signed)
Physical Therapy Session Note  Patient Details  Name: Jeremy Sherman MRN: [id number] Date of Birth: August 09, 1950  Today's Date: 04/07/2019 PT Individual Time: [id number] PT Individual Time Calculation (min): 75 min    Short Term Goals: Week 1:  PT Short Term Goal 1 (Week 1): Pt will consistenly perform sit<>stands using LRAD with mod assist PT Short Term Goal 2 (Week 1): Pt will consistently perform stand pivot transfers using LRAD with mod assist PT Short Term Goal 3 (Week 1): Pt will ambulate at least 49ft using LRAD with mod assist PT Short Term Goal 4 (Week 1): Pt will initiate stair training  Skilled Therapeutic Interventions/Progress Updates:    Pt supine in bed sleeping upon PT arrival, agreeable to therapy tx, and denies pain. Pt performed supine>sit transfer with minA from therapist for LE management. From sitting at EOB, pt performed stand pivot transfer with modA from therapist to assist LE extension to come up into upright posture and with cues for sequencing, technique, and RW management. Pt transported to rehab gym in w/c for time management. Stand pivot transfer from w/c> mat table with RW requring modA. Pt seated at EOM>supine at [facility] with CGA and wedge behind him, therapist assisted in modified hip flexor stretch in thomas test position bilaterally, pt groaned but stated no pain just a big stretch. Pt came back up to sitting with minA and transferred to long sitting on mat with minA for LE management. Therapist sat on physioball behind pt to assist in truncal control and helping pt lean forward for bilateral hamstring stretch. Pt in supine/hooklying on mat performed the following exercises with minA for set up and technique: heel slides x5 reps bilaterally, manually resisted hip abduction x10, bridging x10 reps. Pt rolled to L continuing into prone with modA for LE management and assist at upper trunk for positioning to work on prolonged bilateral hip flexor stretch, attempted  to give gentle pressure at bilaterally hips with oscillation to assist hip flexor stretch, pt willing to participate however stated positioning was uncomfortable, pt rolled L into supine with modA for LE management and upper trunk assist. Supine> sitting EOM with minA for LE management off of mat. Pt required modA sit from elevated EOM > stand at RW, cues for sequencing and upright posture with mirror in front of pt for visual feedback. Performed sit>stand with three musketeers and modA to facilitated upright posture and dec forward trunk flexion in standing, pt performed reaching with LUE for horseshoes at top of mirror and gave to therapist reaching at various heights and distances, demonstrating dec truncal rotation. Stand pivot transfer back to w/c with minA as pt demonstrated improved ability to come up to standing. Pt transported back to room in w/c at end of session. Stand pivot with minA to bed> sitting EOB> supine minA to assist LE positioning. Pt left supine in bed with needs in reach and bed alarm set.   Therapy Documentation Precautions:  Precautions Precautions: Fall Precaution Comments: monitor BP Restrictions Weight Bearing Restrictions: No   Therapy/Group: Individual Therapy  Juliann Pulse SPT 04/07/2019, 7:39 AM

## 2019-04-07 NOTE — Progress Notes (Signed)
[place] PHYSICAL MEDICINE & REHABILITATION PROGRESS NOTE   Subjective/Complaints:  No issues overnite , per pt , oriented to person and place not day of week     ROS- no swallowing issues, no CP, no SOB, no N/V/D  Objective:   No results found. No results for input(s): WBC, HGB, HCT, PLT in the last 72 hours. No results for input(s): NA, K, CL, CO2, GLUCOSE, BUN, CREATININE, CALCIUM in the last 72 hours.  Intake/Output Summary (Last 24 hours) at 04/07/2019 0806 Last data filed at 04/07/2019 0600 Gross per 24 hour  Intake 120 ml  Output 875 ml  Net -755 ml     Physical Exam: Vital Signs Blood pressure (!) 135/52, pulse 73, temperature 98.6 F (37 C), resp. rate 18, height 6\' 5"  (1.956 m), weight 110.6 kg, SpO2 97 %.   General: No acute distress;sleepy; appropriate; asleep initially, but woke to verbal stimuli; NAD; very tall- sitting up in bedside chair Mood and affect are appropriate Heart: Regular rate and rhythm no rubs murmurs or extra sounds Lungs: Clear to auscultation, breathing unlabored, no rales or wheezes Abdomen: Positive bowel sounds, soft nontender to palpation, nondistended Extremities: No clubbing, cyanosis, or edema Skin: No evidence of breakdown, no evidence of rash Neurologic:, motor strength is 5/5 in bilateral deltoid, bicep, tricep, grip, hip flexor, knee extensors, ankle dorsiflexor and plantar flexor Sensory exam normal sensation to light touch and proprioception in bilateral upper and lower extremities Cerebellar exam normal finger to nose to finger as well as heel to shin in bilateral upper and lower extremities Musculoskeletal: Full range of motion in all 4 extremities. No joint swelling    Assessment/Plan: 1. Functional deficits secondary to L ACA aneurysm, MCA  Stenosis causing Left hemispheric ischemia which require 3+ hours per day of interdisciplinary therapy in a comprehensive inpatient rehab setting.  Physiatrist is providing close team  supervision and 24 hour management of active medical problems listed below.  Physiatrist and rehab team continue to assess barriers to discharge/monitor patient progress toward functional and medical goals  Care Tool:  Bathing    Body parts bathed by patient: Right arm, Left arm, Chest, Abdomen, Right upper leg, Left upper leg, Face, Front perineal area   Body parts bathed by helper: Buttocks, Right lower leg, Left lower leg     Bathing assist Assist Level: Moderate Assistance - Patient 50 - 74%     Upper Body Dressing/Undressing Upper body dressing   What is the patient wearing?: Pull over shirt    Upper body assist Assist Level: Moderate Assistance - Patient 50 - 74%    Lower Body Dressing/Undressing Lower body dressing      What is the patient wearing?: Pants, Incontinence brief     Lower body assist Assist for lower body dressing: Maximal Assistance - Patient 25 - 49%     Toileting Toileting    Toileting assist Assist for toileting: Maximal Assistance - Patient 25 - 49%     Transfers Chair/bed transfer  Transfers assist     Chair/bed transfer assist level: Maximal Assistance - Patient 25 - 49%     Locomotion Ambulation   Ambulation assist   Ambulation activity did not occur: Safety/medical concerns  Assist level: 2 helpers(modA+2 with w/c follow for safety) Assistive device: Walker-rolling Max distance: 29.5'   Walk 10 feet activity   Assist  Walk 10 feet activity did not occur: Safety/medical concerns  Assist level: 2 helpers(modA+2 with w/c follow for safety) Assistive device: Walker-rolling  Walk 50 feet activity   Assist Walk 50 feet with 2 turns activity did not occur: Safety/medical concerns         Walk 150 feet activity   Assist Walk 150 feet activity did not occur: Safety/medical concerns         Walk 10 feet on uneven surface  activity   Assist Walk 10 feet on uneven surfaces activity did not occur:  Safety/medical concerns         Wheelchair     Assist Will patient use wheelchair at discharge?: (TBD)             Wheelchair 50 feet with 2 turns activity    Assist            Wheelchair 150 feet activity     Assist          Blood pressure (!) 135/52, pulse 73, temperature 98.6 F (37 C), resp. rate 18, height 6\' 5"  (1.956 m), weight 110.6 kg, SpO2 97 %.    Medical Problem List and Plan:  1. 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 Antithrombotics:  -DVT/anticoagulation: Eliquis  -antiplatelet therapy: Brilinta  3. Pain Management: Tramadol as needed  4. Mood: Provide emotional support  -antipsychotic agents: N/A  5. Neuropsych: This patient is capable of making decisions on his own behalf.  6. Skin/Wound Care: Routine skin checks  7. Fluids/Electrolytes/Nutrition: Routine in and outs with follow-up chemistries  8. Hypertension. Patient on no home med antihypertensive medications. Monitor with increased mobility  9. Hyperlipidemia. Lipitor  10. Tobacco abuse. Counseling  11. Prediabetes. Hemoglobin A1c 6.4. SSI. Patient on no diabetic agents prior to admission. Well controlled no meds CBG (last 3)  Recent Labs    04/05/19 1735 04/06/19 0628 04/07/19 0622  GLUCAP 92 84 100*   2/6- change BG checks to BID  12. BPH /UTI. Flomax 0.4 mg daily. Urine culture 03/28/2019 no growth. Urine study 03/29/2019 + nitrite. Empiric Levaquin x5 doses completed.  -per note, had replaced the foley due to urinary retention? Voiding trial once more mobile  13.  Hypothyroidism. TSH 45! Synthroid started. Patient will need follow-up thyroid panel in 3 months  2/7- likely reason sleeping so much.   20. Per chart, severe PAD with R foot ischemia- has LE foot/ankle pain on exam that is likely c/w PAD- will monitor closely for skin breakdown, etc.      LOS: 4 days A FACE TO FACE EVALUATION WAS PERFORMED  Charlett Blake 04/07/2019, 8:06 AM

## 2019-04-08 ENCOUNTER — Inpatient Hospital Stay (HOSPITAL_COMMUNITY): Payer: BC Managed Care – PPO | Admitting: Physical Therapy

## 2019-04-08 ENCOUNTER — Inpatient Hospital Stay (HOSPITAL_COMMUNITY): Payer: BC Managed Care – PPO | Admitting: Occupational Therapy

## 2019-04-08 ENCOUNTER — Inpatient Hospital Stay (HOSPITAL_COMMUNITY): Payer: BC Managed Care – PPO | Admitting: Speech Pathology

## 2019-04-08 LAB — GLUCOSE, CAPILLARY
Glucose-Capillary: 85 mg/dL (ref 70–99)
Glucose-Capillary: 99 mg/dL (ref 70–99)
Glucose-Capillary: 106 mg/dL — ABNORMAL HIGH (ref 70–99)

## 2019-04-08 NOTE — Progress Notes (Signed)
Occupational Therapy Session Note  Patient Details  Name: Jeremy Sherman MRN: [id number] Date of Birth: 1950-04-11  Today's Date: 04/08/2019 OT Individual Time: 0802-0859 OT Individual Time Calculation (min): 57 min    Short Term Goals: Week 1:  OT Short Term Goal 1 (Week 1): Pt will complete LB bathing sit to stand with AE and min instructional cueing. OT Short Term Goal 2 (Week 1): Pt will complete LB dressing sit to stand with mod assist and AE PRN. OT Short Term Goal 3 (Week 1): Pt will complete toilet transfer with min assist using the RW for support. OT Short Term Goal 4 (Week 1): Pt will complete walk-in shower transfer with min assist and use of the RW for support.  Skilled Therapeutic Interventions/Progress Updates:    Pt completed supine to sit EOB with supervision to start session.  He was able to scoot out to the edge of the bed and then complete stand pivot transfer to the wheelchair with mod assist using the RW for support.  Increased lumbar flexion in standing noted.  He completed bathing and dressing sit to stand at the sink.  Supervision for UB bathing and dressing with mod assist for LB bathing.  He was able to doff his gripper socks and pants with min assist, but needed mod assist for donning his pants over his feet with max assist for application of new brief.  Noted bowel incontinence in the brief when standing to pull up his pants.  Pt continues to moan throughout session even though he states no significant pain.  He does report some generalized pain in his knees as well as his feet when working on washing them.  Finished session with pt in the wheelchair with call button and phone in reach and safety belt in place.    Therapy Documentation Precautions:  Precautions Precautions: Fall Precaution Comments: monitor BP Restrictions Weight Bearing Restrictions: No   Pain: Pain Assessment Pain Scale: Faces Faces Pain Scale: Hurts a little bit Pain Type: Acute  pain Pain Location: Generalized Pain Descriptors / Indicators: Discomfort;Moaning Pain Onset: With Activity Pain Intervention(s): Emotional support ADL: See Care Tool Section for some details of mobility and selfcare  Therapy/Group: Individual Therapy  Javonni Macke OTR/L 04/08/2019, 12:19 PM

## 2019-04-08 NOTE — Progress Notes (Signed)
Speech Language Pathology Daily Session Note  Patient Details  Name: Jeremy Sherman MRN: [id number] Date of Birth: 08/18/1950  Today's Date: 04/08/2019 SLP Individual Time: 1300-1346 SLP Individual Time Calculation (min): 46 min  Short Term Goals: Week 1: SLP Short Term Goal 1 (Week 1): Pt will consume current diet with minimal overt s/sx aspiration and demonstrate efficient mastication and oral clearance with Min A verbal cues for use of safe swallow strategies. SLP Short Term Goal 2 (Week 1): Pt will consume trials of Dys 3 solids and demonstrate efficient mastication and oral clearance X3 prior to upgrade. SLP Short Term Goal 3 (Week 1): Pt will demonstrate ability to problem solve during functional familiar tasks with Min A verbal/visual cues. SLP Short Term Goal 4 (Week 1): Pt will selectively attend to tasks with Min A verbal/verbal cues for redirection. SLP Short Term Goal 5 (Week 1): Pt will recall new and daily information with Mod A verbal/visual cues for use of compensatory strategies. SLP Short Term Goal 6 (Week 1): Pt will demonstrate intellectual awareness by stating 1 physical and 1 cognitive deficit with Mod A verbal/visual cues.  Skilled Therapeutic Interventions: Pt was seen for skilled ST targeting dysphagia and cognitive goals. SLP provided skilled observation of pt consuming current Dys 2 lunch tray with thin liquids, as well as an upgraded trial of Dys 3 solids. Min A verbal cues were required for pt to utilize small bite size, as well as cues for redirection to PO intake (pt easily externally distracted by background and hallway noise). Pt demonstrated efficient mastication and oral clearance with Dys 2 and purees from lunch tray, slightly prolonged but more swift and efficient mastication noted with Dys 3 trial today. No overt s/sx aspiration noted across solids or liquids. Recommend continue current diet for now and trial full tray or larger snack size of Dys 3 at  next available date to determine readiness for solid advancement. SLP further facilitated session with a basic medication management task from the [place]. Pt required Mod increasing to Max A verbal and visual cues for problem solving and error awareness as well as Mod A verbal cues for recall throughout that task. Extra time also required for pt's completion of medication management task. He required assistance locating and interpreting information regarding medication labels. Pt left sitting in chair with alarm set and needs within reach. Continue per current plan of care.         Pain Pain Assessment Pain Scale: 0-10 Pain Score: 0-No pain  Therapy/Group: Individual Therapy  Arbutus Leas 04/08/2019, 3:27 PM

## 2019-04-08 NOTE — Progress Notes (Signed)
Physical Therapy Session Note  Patient Details  Name: Jeremy Sherman MRN: [id number] Date of Birth: 05-24-1950  Today's Date: 04/08/2019 PT Individual Time: 1019-1116 PT Individual Time Calculation (min): 57 min   Short Term Goals: Week 1:  PT Short Term Goal 1 (Week 1): Pt will consistenly perform sit<>stands using LRAD with mod assist PT Short Term Goal 2 (Week 1): Pt will consistently perform stand pivot transfers using LRAD with mod assist PT Short Term Goal 3 (Week 1): Pt will ambulate at least 59ft using LRAD with mod assist PT Short Term Goal 4 (Week 1): Pt will initiate stair training  Skilled Therapeutic Interventions/Progress Updates:   Pt received sitting in w/c and agreeable to therapy session. Transported to/from gym in w/c for energy conservation. Sit>stand w/c>RW with heavy reliance on B UE pushing on armrests to come to stand and min assist for lifting. Gait training ~42ft using RW with min assist for balance and pt continuing to demonstrate heavy reliance on B UE support via RW for balance and walking due to weak B LEs with forward flexed trunk posture - cuing throughout for correction with pt minimally able to improve - reports B LE feet pain during gait (instructed him to ask his wife to bring in shoes again). Repeated sit<>stands significantly elevated EOM<>RW targeting B LE strength with pt requiring use of B UEs to come to stand but performing eccentric lowering without UE support x5 reps - demonstrates poor ability to bring trunk upright to stand lacking trunk/hip extension strength relying on pushing up through RW. Sit>supine with min assist for lifting BLEs onto mat. Supine hip flexor stretch x2 minutes.  Supine B LE strengthening exercises of: - heel slides x5reps each with pt requiring active assist on L LE due to weakness - bridging 2x10 reps; only moderate hip clearance with tactile cuing for improved form and increased glute activation - short arc quads  x10reps each - cuing for full knee extension Pt demonstrates great deal of effort to perform the above exercises and afterwards states it was very tiring for him. Supine>sit with min assist for trunk upright and max multimodal cuing for sequencing of logroll technique for increased pt independence. Stand pivot to w/c using RW with min assist for coming to standing and CGA for steadying - cuing to turn fully with RW prior to initiating sitting.  Pt denies lightheadedness throughout session.  Therapy Documentation Precautions:  Precautions Precautions: Fall Precaution Comments: monitor BP Restrictions Weight Bearing Restrictions: No  Pain: Reports some  BLE feet pain during ambulation but otherwise no complaints of pain though pt frequently moans/groans with all movement/activity.   Therapy/Group: Individual Therapy  Tawana Scale, PT, DPT 04/08/2019, 8:02 AM

## 2019-04-08 NOTE — Progress Notes (Signed)
Occupational Therapy Session Note  Patient Details  Name: Jeremy Sherman MRN: [id number] Date of Birth: Jun 25, 1950  Today's Date: 04/08/2019 OT Individual Time: 1401-1443 OT Individual Time Calculation (min): 42 min    Short Term Goals: Week 1:  OT Short Term Goal 1 (Week 1): Pt will complete LB bathing sit to stand with AE and min instructional cueing. OT Short Term Goal 2 (Week 1): Pt will complete LB dressing sit to stand with mod assist and AE PRN. OT Short Term Goal 3 (Week 1): Pt will complete toilet transfer with min assist using the RW for support. OT Short Term Goal 4 (Week 1): Pt will complete walk-in shower transfer with min assist and use of the RW for support.  Skilled Therapeutic Interventions/Progress Updates:    Pt completed supine to sit EOB with supervision.  He then transferred stand pivot to the wheelchair with mod assist using the RW for support.  Increased trunk flexion in standing as well as increased vocalizations of moaning noted.  Took pt down to the ADL apartment where he completed tub/shower transfers with use of the tub bench and overall mod assist.  He needed assist with lifting both LEs into and out of the tub during transfer as well as for sit to stand from the tub bench.  [name] continues to need mod demonstrational cueing for hand placement during sit to stand as well as he tends to want to pull up on the RW for support instead of pushing up from the wheelchair or from the surface he is sitting on.  Finished session with transfer back to the wheelchair and transition to the room.  He completed transfer back to the bed with mod assist.  Call button and phone in reach with bed alarm in place.    Therapy Documentation Precautions:  Precautions Precautions: Fall Precaution Comments: monitor BP Restrictions Weight Bearing Restrictions: No   Pain: Pain Assessment Pain Scale: 0-10 Pain Score: 0-No pain Faces Pain Scale: Hurts a little bit Pain  Type: Acute pain Pain Location: Generalized Pain Onset: With Activity Pain Intervention(s): Emotional support;Repositioned Multiple Pain Sites: No ADL: See Care Tool Section for some details of mobility and selfcare  Therapy/Group: Individual Therapy  Raylyn Carton OTR/L 04/08/2019, 4:02 PM

## 2019-04-08 NOTE — Progress Notes (Signed)
[place] PHYSICAL MEDICINE & REHABILITATION PROGRESS NOTE   Subjective/Complaints:  Patient does not recall what he did in therapy this morning.  Had bathing and dressing session with OT.  Did not recall whether his therapist was male or male.  ROS- no swallowing issues, no CP, no SOB, no N/V/D  Objective:   No results found. No results for input(s): WBC, HGB, HCT, PLT in the last 72 hours. No results for input(s): NA, K, CL, CO2, GLUCOSE, BUN, CREATININE, CALCIUM in the last 72 hours.  Intake/Output Summary (Last 24 hours) at 04/08/2019 1117 Last data filed at 04/08/2019 0710 Gross per 24 hour  Intake 651 ml  Output 1100 ml  Net -449 ml     Physical Exam: Vital Signs Blood pressure 121/67, pulse 71, temperature 98 F (36.7 C), resp. rate 18, height 6\' 5"  (1.956 m), weight 110.6 kg, SpO2 97 %.   General: No acute distress;sleepy; appropriate; asleep initially, but woke to verbal stimuli; NAD; sitting in wheelchair at bedside Mood and affect are appropriate Heart: Regular rate and rhythm no rubs murmurs or extra sounds Lungs: Clear to auscultation, breathing unlabored, no rales or wheezes Abdomen: Positive bowel sounds, soft nontender to palpation, nondistended Extremities: No clubbing, cyanosis, or edema Skin: No evidence of breakdown, no evidence of rash Neurologic:, motor strength is 4/5 in right and 5/5 left deltoid, bicep, tricep, grip, hip flexor, knee extensors, ankle dorsiflexor and plantar flexor Sensory exam normal sensation to light touch and proprioception in bilateral upper and lower extremities Cerebellar exam normal finger to nose to finger as well as heel to shin in bilateral upper and lower extremities Musculoskeletal: Full range of motion in all 4 extremities. No joint swelling    Assessment/Plan: 1. Functional deficits secondary to L ACA aneurysm, MCA  Stenosis causing Left hemispheric ischemia which require 3+ hours per day of interdisciplinary therapy  in a comprehensive inpatient rehab setting.  Physiatrist is providing close team supervision and 24 hour management of active medical problems listed below.  Physiatrist and rehab team continue to assess barriers to discharge/monitor patient progress toward functional and medical goals  Care Tool:  Bathing    Body parts bathed by patient: Right arm, Left arm, Chest, Abdomen, Right upper leg, Left upper leg, Face   Body parts bathed by helper: Right lower leg, Left lower leg, Front perineal area, Buttocks     Bathing assist Assist Level: Moderate Assistance - Patient 50 - 74%     Upper Body Dressing/Undressing Upper body dressing   What is the patient wearing?: Pull over shirt    Upper body assist Assist Level: Moderate Assistance - Patient 50 - 74%    Lower Body Dressing/Undressing Lower body dressing      What is the patient wearing?: Pants, Incontinence brief     Lower body assist Assist for lower body dressing: Maximal Assistance - Patient 25 - 49%     Toileting Toileting    Toileting assist Assist for toileting: Maximal Assistance - Patient 25 - 49%     Transfers Chair/bed transfer  Transfers assist     Chair/bed transfer assist level: Moderate Assistance - Patient 50 - 74%     Locomotion Ambulation   Ambulation assist   Ambulation activity did not occur: Safety/medical concerns  Assist level: 2 helpers(modA+2 with w/c follow for safety) Assistive device: Walker-rolling Max distance: 29.5'   Walk 10 feet activity   Assist  Walk 10 feet activity did not occur: Safety/medical concerns  Assist level:  2 helpers(modA+2 with w/c follow for safety) Assistive device: Walker-rolling   Walk 50 feet activity   Assist Walk 50 feet with 2 turns activity did not occur: Safety/medical concerns         Walk 150 feet activity   Assist Walk 150 feet activity did not occur: Safety/medical concerns         Walk 10 feet on uneven surface   activity   Assist Walk 10 feet on uneven surfaces activity did not occur: Safety/medical concerns         Wheelchair     Assist Will patient use wheelchair at discharge?: (TBD)             Wheelchair 50 feet with 2 turns activity    Assist            Wheelchair 150 feet activity     Assist          Blood pressure 121/67, pulse 71, temperature 98 F (36.7 C), resp. rate 18, height 6\' 5"  (1.956 m), weight 110.6 kg, SpO2 97 %.    Medical Problem List and Plan:  1. 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  PT, OT, SLP evals today  2. Antithrombotics:  -DVT/anticoagulation: Eliquis  -antiplatelet therapy: Brilinta  3. Pain Management: Tramadol as needed  4. Mood: Provide emotional support  -antipsychotic agents: N/A  5. Neuropsych: This patient is capable of making decisions on his own behalf.  6. Skin/Wound Care: Routine skin checks  7. Fluids/Electrolytes/Nutrition: Routine in and outs with follow-up chemistries  8. Hypertension. Patient on no home med antihypertensive medications. Monitor with increased mobility  9. Hyperlipidemia. Lipitor  10. Tobacco abuse. Counseling  11. Prediabetes. Hemoglobin A1c 6.4. SSI. Patient on no diabetic agents prior to admission. Well controlled no meds CBG (last 3)  Recent Labs    04/07/19 1629 04/07/19 2119 04/08/19 0637  GLUCAP 98 107* 85   Controlled 2/9 continue CBG BID  12. BPH /UTI. Flomax 0.4 mg daily. Urine culture 03/28/2019 no growth. Urine study 03/29/2019 + nitrite. Empiric Levaquin x5 doses completed.  Patient voiding incontinently, but  also requiring catheterization some blood in the cath we will continue to monitor.  May need to replace Foley 13. Hypothyroidism. TSH 45! Synthroid started. Patient will need follow-up thyroid panel in 3 months  2/7- likely reason sleeping so much.   79. Per chart, severe PAD with R foot ischemia- has LE foot/ankle pain on exam that is likely c/w PAD- will monitor closely for skin breakdown, etc.      LOS: 5 days A FACE TO FACE EVALUATION WAS PERFORMED  Charlett Blake 04/08/2019, 11:17 AM

## 2019-04-09 ENCOUNTER — Inpatient Hospital Stay (HOSPITAL_COMMUNITY): Payer: BC Managed Care – PPO

## 2019-04-09 ENCOUNTER — Inpatient Hospital Stay (HOSPITAL_COMMUNITY): Payer: BC Managed Care – PPO | Admitting: Occupational Therapy

## 2019-04-09 ENCOUNTER — Inpatient Hospital Stay (HOSPITAL_COMMUNITY): Payer: BC Managed Care – PPO | Admitting: Physical Therapy

## 2019-04-09 ENCOUNTER — Inpatient Hospital Stay (HOSPITAL_COMMUNITY): Payer: BC Managed Care – PPO | Admitting: Speech Pathology

## 2019-04-09 LAB — GLUCOSE, CAPILLARY
Glucose-Capillary: 97 mg/dL (ref 70–99)
Glucose-Capillary: 87 mg/dL (ref 70–99)

## 2019-04-09 MED ORDER — LIDOCAINE HCL URETHRAL/MUCOSAL 2 % EX GEL
CUTANEOUS | Status: DC | PRN
  Filled 2019-04-09: qty 10
  Filled 2019-04-09: qty 5

## 2019-04-09 MED ORDER — BETHANECHOL CHLORIDE 10 MG PO TABS
10.0000 mg | ORAL_TABLET | Freq: Three times a day (TID) | ORAL | Status: DC
  Administered 2019-04-09 – 2019-04-12 (×11): 10 mg via ORAL
  Filled 2019-04-09 (×11): qty 1

## 2019-04-09 NOTE — Progress Notes (Signed)
[place] PHYSICAL MEDICINE & REHABILITATION PROGRESS NOTE   Subjective/Complaints:  No issues overnite, seen in PT, became fatigued during quadriped activities  Was working FT prior to hospitalization   ROS- no swallowing issues, no CP, no SOB, no N/V/D  Objective:   No results found. No results for input(s): WBC, HGB, HCT, PLT in the last 72 hours. No results for input(s): NA, K, CL, CO2, GLUCOSE, BUN, CREATININE, CALCIUM in the last 72 hours.  Intake/Output Summary (Last 24 hours) at 04/09/2019 0852 Last data filed at 04/09/2019 0438 Gross per 24 hour  Intake 499 ml  Output 850 ml  Net -351 ml     Physical Exam: Vital Signs Blood pressure 139/62, pulse 70, temperature 99.1 F (37.3 C), temperature source Oral, resp. rate 18, height 6\' 5"  (1.956 m), weight 106.9 kg, SpO2 100 %.   General: No acute distress;sleepy; appropriate; asleep initially, but woke to verbal stimuli; NAD; sitting in wheelchair at bedside Mood and affect are appropriate Heart: Regular rate and rhythm no rubs murmurs or extra sounds Lungs: Clear to auscultation, breathing unlabored, no rales or wheezes Abdomen: Positive bowel sounds, soft nontender to palpation, nondistended Extremities: No clubbing, cyanosis, or edema Skin: No evidence of breakdown, no evidence of rash Neurologic:, motor strength is 4/5 in right and 5/5 left deltoid, bicep, tricep, grip, hip flexor, knee extensors, ankle dorsiflexor and plantar flexor Sensory exam normal sensation to light touch and proprioception in bilateral upper and lower extremities Cerebellar exam normal finger to nose to finger as well as heel to shin in bilateral upper and lower extremities Musculoskeletal: Full range of motion in all 4 extremities. No joint swelling    Assessment/Plan: 1. Functional deficits secondary to L ACA aneurysm, MCA  Stenosis causing Left hemispheric ischemia which require 3+ hours per day of interdisciplinary therapy in a  comprehensive inpatient rehab setting.  Physiatrist is providing close team supervision and 24 hour management of active medical problems listed below.  Physiatrist and rehab team continue to assess barriers to discharge/monitor patient progress toward functional and medical goals  Care Tool:  Bathing    Body parts bathed by patient: Right arm, Left arm, Chest, Abdomen, Right upper leg, Left upper leg, Right lower leg, Left lower leg, Face   Body parts bathed by helper: Front perineal area, Buttocks     Bathing assist Assist Level: Moderate Assistance - Patient 50 - 74%     Upper Body Dressing/Undressing Upper body dressing   What is the patient wearing?: Pull over shirt    Upper body assist Assist Level: Supervision/Verbal cueing    Lower Body Dressing/Undressing Lower body dressing      What is the patient wearing?: Pants, Incontinence brief     Lower body assist Assist for lower body dressing: Maximal Assistance - Patient 25 - 49%     Toileting Toileting    Toileting assist Assist for toileting: Maximal Assistance - Patient 25 - 49%     Transfers Chair/bed transfer  Transfers assist     Chair/bed transfer assist level: Moderate Assistance - Patient 50 - 74%     Locomotion Ambulation   Ambulation assist   Ambulation activity did not occur: Safety/medical concerns  Assist level: Minimal Assistance - Patient > 75% Assistive device: Walker-rolling Max distance: 11ft   Walk 10 feet activity   Assist  Walk 10 feet activity did not occur: Safety/medical concerns  Assist level: Minimal Assistance - Patient > 75% Assistive device: Walker-rolling   Walk 50 feet  activity   Assist Walk 50 feet with 2 turns activity did not occur: Safety/medical concerns  Assist level: Minimal Assistance - Patient > 75% Assistive device: Walker-rolling    Walk 150 feet activity   Assist Walk 150 feet activity did not occur: Safety/medical concerns          Walk 10 feet on uneven surface  activity   Assist Walk 10 feet on uneven surfaces activity did not occur: Safety/medical concerns         Wheelchair     Assist Will patient use wheelchair at discharge?: (TBD)             Wheelchair 50 feet with 2 turns activity    Assist            Wheelchair 150 feet activity     Assist          Blood pressure 139/62, pulse 70, temperature 99.1 F (37.3 C), temperature source Oral, resp. rate 18, height 6\' 5"  (1.956 m), weight 106.9 kg, SpO2 100 %.    Medical Problem List and Plan:  1. 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  PT, OT, SLP evals today  2. Antithrombotics:  -DVT/anticoagulation: Eliquis  -antiplatelet therapy: Brilinta  3. Pain Management: Tramadol as needed  4. Mood: Provide emotional support  -antipsychotic agents: N/A  5. Neuropsych: This patient is capable of making decisions on his own behalf.  6. Skin/Wound Care: Routine skin checks  7. Fluids/Electrolytes/Nutrition: Routine in and outs with follow-up chemistries  8. Hypertension. Patient on no home med antihypertensive medications. Monitor with increased mobility  9. Hyperlipidemia. Lipitor  10. Tobacco abuse. Counseling  11. Prediabetes. Hemoglobin A1c 6.4. SSI. Patient on no diabetic agents prior to admission. Well controlled no meds CBG (last 3)  Recent Labs    04/08/19 1145 04/08/19 1731 04/09/19 0618  GLUCAP 99 106* 97   Controlled 2/9 continue CBG BID  12. BPH /UTI. Flomax 0.4 mg daily. Urine culture 03/28/2019 no growth. Urine study 03/29/2019 + nitrite. Empiric  Levaquin x5 doses completed.  Patient voiding incontinently, but also requiring catheterization some blood in the cath we will continue to monitor.  May need to replace Foley 13. Hypothyroidism. TSH 45! Synthroid started. Patient will need follow-up thyroid panel in 3 months  2/7- likely reason sleeping so much.   14. Per chart, severe PAD with R foot ischemia- has LE foot/ankle pain on exam that is likely c/w PAD- will monitor closely for skin breakdown, etc.      LOS: 6 days A FACE TO FACE EVALUATION WAS PERFORMED  Charlett Blake 04/09/2019, 8:52 AM

## 2019-04-09 NOTE — Progress Notes (Signed)
Speech Language Pathology Daily Session Note  Patient Details  Name: Jeremy Sherman MRN: [id number] Date of Birth: 01-08-1951  Today's Date: 04/09/2019 SLP Individual Time: [id number] SLP Individual Time Calculation (min): 29 min  Short Term Goals: Week 1: SLP Short Term Goal 1 (Week 1): Pt will consume current diet with minimal overt s/sx aspiration and demonstrate efficient mastication and oral clearance with Min A verbal cues for use of safe swallow strategies. SLP Short Term Goal 2 (Week 1): Pt will consume trials of Dys 3 solids and demonstrate efficient mastication and oral clearance X3 prior to upgrade. SLP Short Term Goal 3 (Week 1): Pt will demonstrate ability to problem solve during functional familiar tasks with Min A verbal/visual cues. SLP Short Term Goal 4 (Week 1): Pt will selectively attend to tasks with Min A verbal/verbal cues for redirection. SLP Short Term Goal 5 (Week 1): Pt will recall new and daily information with Mod A verbal/visual cues for use of compensatory strategies. SLP Short Term Goal 6 (Week 1): Pt will demonstrate intellectual awareness by stating 1 physical and 1 cognitive deficit with Mod A verbal/visual cues.  Skilled Therapeutic Interventions: Pt was seen for skilled ST targeting cognitive skills. Pt was fatigued upon arrival however easily aroused and agreeable to sit upright and participate in ST. Pt required Max A verbal cues in order to recall details of previous therapy sessions earlier today, however did recall their occurrence with Supervision A question cues. Pt with improved orientation - independently oriented to place and month today. Multiple choice cues required for correction of orientation to year and Min A verbal cues required for orientation to situation. During a novel basic card task, pt identified 2 card to equal 11 with Min A verbal and visual for problem solving, increased Mod A verbal cues required for recall and error awareness  throughout task. Pt left laying in bed with alarm set and needs within reach. Continue per current plan of care. ]     Pain Pain Assessment Pain Scale: 0-10 Pain Score: 0-No pain  Therapy/Group: Individual Therapy  Arbutus Leas 04/09/2019, 3:44 PM

## 2019-04-09 NOTE — Progress Notes (Signed)
Team Conference Report to Patient/Family  Team Conference discussion was reviewed with the patient and caregiver/wife, including goals of Supervision overall including problem solving and memory except for Min A with steps, any changes in plan of care and target discharge date of 04/24/19.  Family education set for 04/22/19.  Patient and caregiver express understanding and are in agreement.  Reviewed with wife pre-admission work and standing for periods of time, reported as did the patient that he did not have to stand for long, could sit down and do his job at the [employer]. She noted he wore work boots that are very big/heavy shoes before,. The wife noted she was not able to see the patient's feet as he kept his socks on all of the time feet were not in shoes. Also reported 2 small steps into the home with porch posts to hold onto to get into the home, there is not enough space for rails. She brought up new tennis shoe like shoes as his regular shoes were lost in transit to rehab. Dorien Chihuahua B 04/09/2019, 2:41 PM

## 2019-04-09 NOTE — Progress Notes (Signed)
Physical Therapy Session Note  Patient Details  Name: Jeremy Sherman MRN: [id number] Date of Birth: 08-16-1950  Today's Date: 04/09/2019 PT Individual Time: 0802-0845 PT Individual Time Calculation (min): 43 min    Short Term Goals: Week 1:  PT Short Term Goal 1 (Week 1): Pt will consistenly perform sit<>stands using LRAD with mod assist PT Short Term Goal 2 (Week 1): Pt will consistently perform stand pivot transfers using LRAD with mod assist PT Short Term Goal 3 (Week 1): Pt will ambulate at least 10ft using LRAD with mod assist PT Short Term Goal 4 (Week 1): Pt will initiate stair training  Skilled Therapeutic Interventions/Progress Updates:    Patient supine in bed upon PT arrival, agreeable to therapy tx, denies pain today. Therapist provided minA for bed mobility and sidelying>sitting EOB for LE management. Stand pivot transfer with minA from slightly elevated bed>w/c cues for upright posture, sequencing, and LE placement. Transported in w/c to rehab gym for time management. Stand pivot transfer from w/c>standing in front of low mat table>tall kneeling on mat table with minA +2 for safety for RW management and cues for sequencing and positioning. In tall kneeling, pt performed the following exercises for proximal strengthening, hip flexor stretching and upright posture: mini squats x5 reps and reaching for horseshoes x5 reps with rest breaks throughout each exercise. Pt tolerated tall kneeling approx. 10 minutes with significant reliance on BUE for support d/t fatigue and increased stretch on B hip flexors. MinA to transfer from tall kneeling>standing in front of mat table>sitting in w/c with cues for sequencing, hand/foot placement and technique. Sit>standing at [employer] for upright posture and cues for hand placement. Pt ambulated 30' forwards and 30' backwards with Harmon Pier walker and MinA +2 for w/c follow for safety. MD in/out for morning assessment. Pt transported back to  room via w/c, left in w/c with needs in reach and chair alarm set.    Therapy Documentation Precautions:  Precautions Precautions: Fall Precaution Comments: monitor BP Restrictions Weight Bearing Restrictions: No   Therapy/Group: Individual Therapy  Juliann Pulse SPT 04/09/2019, 7:36 AM

## 2019-04-09 NOTE — Progress Notes (Signed)
Physical Therapy Session Note  Patient Details  Name: Jeremy Sherman MRN: [id number] Date of Birth: November 25, 1950  Today's Date: 04/09/2019 PT Individual Time: 1300-1410 PT Individual Time Calculation (min): 70 min   Short Term Goals: Week 1:  PT Short Term Goal 1 (Week 1): Pt will consistenly perform sit<>stands using LRAD with mod assist PT Short Term Goal 2 (Week 1): Pt will consistently perform stand pivot transfers using LRAD with mod assist PT Short Term Goal 3 (Week 1): Pt will ambulate at least 65f using LRAD with mod assist PT Short Term Goal 4 (Week 1): Pt will initiate stair training  Skilled Therapeutic Interventions/Progress Updates:   Pt received sitting in WC and agreeable to PT. [facility]   [facility] and increased time. Pt reports significant dizziness in standing. Returned to sitting with improvement of s/s. PT performed orthostatic BP assessment. Sitting 127/63. Standing 046m: 121/54. Standing 1 minute 87/42 with severe s/s. Returned to sitting BP 0 min 103/52 moderate s/s. Sitting 2 minutes 124/55. Mild s/s. Pt transported to day room in WCState Farmeciprocal movement/endurance training 4 x 1 mintue, 30cm/sec. PT assessed BP after 3rd bout 98/72 and 4th bout 90/62. Increasing orthostatic 2/2.    Pt returned to room. Squat pivot transfer to EOB with min assist and cues for safety . Sit>supine with min assist from PT for RLE management and cues for sequencing to roll in to supine. BP assessed on the L arm in supine 105/64. Reassessed after 2 minutes 72/45. Rechecked on the R arm 124/65. Pt left supine in bed with call bell in reach and all need met.       Therapy Documentation Precautions:  Precautions Precautions: Fall Precaution Comments: monitor BP Restrictions Weight Bearing Restrictions: No Vital Signs: Therapy Vitals Temp: 97.9 F (36.6 C) Pulse Rate: 70 Resp: 18 BP: (!) 153/45 Patient Position  (if appropriate): Lying Oxygen Therapy SpO2: 95 % O2 Device: Room Air Pain: denies   Therapy/Group: Individual Therapy  AuLorie Phenix/11/2019, 2:17 PM

## 2019-04-09 NOTE — Patient Care Conference (Signed)
Inpatient RehabilitationTeam Conference and Plan of Care Update Date: 04/09/2019   Time: 10:35 AM   Patient Name: Jeremy Sherman      Medical Record Number: [id number]  Date of Birth: 02-20-1951 Sex: Male         Room/Bed: 4W17C/4W17C-01 Payor Info: Payor: MEDICARE / Plan: MEDICARE PART A / Product Type: *No Product type* /    Admit Date/Time:  04/03/2019  2:28 PM  Primary Diagnosis:  Stenosis of right cerebellar artery  Patient Active Problem List   Diagnosis Date Noted  . 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 AKI (acute kidney injury) ([place])   . Aneurysm of anterior cerebral artery   . Essential hypertension   . Prediabetes   . Dysphagia, post-stroke   . Thrombus of left atrial appendage   . 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 Carotid stenosis 03/21/2019  . CAD (coronary artery disease) 03/21/2019  . Brain aneurysm 03/21/2019  . Stroke [facility]) 03/20/2019    Expected Discharge Date: Expected Discharge Date: 04/24/19  Team Members Present: Physician leading conference: Dr. Alysia Penna Social Worker Present: Lennart Pall, LCSW Nurse Present: Dorien Chihuahua, RN;Other (comment)(Blair Montanti, LPN) Case Manager: Karene Fry, RN PT Present: Michaelene Song, PT OT Present: Willeen Cass, OT SLP Present: Jettie Booze, CF-SLP PPS Coordinator present : Ileana Ladd, Burna Mortimer, SLP     Current Status/Progress Goal Weekly Team Focus  Bowel/Bladder   Incontinent of Bowel LBM02/09 Urinary I&O Cath Q8H  timed toileting to enocurage continence of bowel. Regain ability to void on own  timed toilet and cath as scheduled   Swallow/Nutrition/ Hydration   Dys 2/thin, Min A smaller bites and susatined attention to intake, minimize distractions, intermittent supervision  Supervision A least restrictive environment  Continue Dys 3 trials, safe swallow strategies   ADL's   Supervision for UB selfcare with mod to max for LB selfcare.  Mod assist for  transfers with use of the RW approaching min assist level.  Low endurance overall for tasks  supervision  selfcare retraining, balance retraining, endurance building, DME education, therapeutic activities.   Mobility   min A bed mobility, mod/max A sit<>stands w/ RW (elevate surface), stand pivot transfers modA gait up to 30 ft with RW minA+2 for safety and w/c follow, 1 step with B rails and mod assist - heavy reliance on UEs/significantly flexed  supervision-CGA overall, minA for stairs  OOB activity tolerance, bed mobility training for independence, sit<>stands, stand pivot w/ AD, gait, try upwalker to faciliate upright posture, LE stretching   Communication             Safety/Cognition/ Behavioral Observations  Mod-Max A basic to mildly complex problem solving, Mod A orientation to time and short term recall, Min A selective attention  Supervision-Min  A  basic problem solving, orientation, selective attention, intellectual awareness of cognitive deficits   Pain   denies any pain  free of pain  assess for pain qshift and prn medicate as directed   Skin   Surgical incisions on left side of neck chest and groin.  remain free from infection and skin breakdown  assess skin qshift and prn    Rehab Goals Patient on target to meet rehab goals: Yes *See Care Plan and progress notes for long and short-term goals.     Barriers to Discharge  Current Status/Progress Possible Resolutions Date Resolved   Nursing  PT                    OT                  SLP                SW Home environment access/layout Home with wife Daughter from [place] in/out of home can provide intermittent assist; plan for wife to assist patient as needed at discharge          Discharge Planning/Teaching Needs:  Home with wife who can provide 24/7 assistance  TBD   Team Discussion: Cognition impaired, small vessel dz, was still working prior to stroke.  RN moans and groans, I+O cath, low volumes,  inc bowels.  OT S UB, min/mod transfers, poor endurance, max LB dressing, L toes pain, feet sore.  PT min/mod to stand, EVA walker today tol well, tightness, goals S/CGA, min A stair goal, used cane for work.  SLP upgrade to D3, mod/max basic prob solving, S goals.  Wife can do CGA, Dtr is a Pharmacist, hospital.  Fam ed to be scheduled 2 days before DC.   Revisions to Treatment Plan: N/A     Medical Summary Current Status: incont bowel and bladder poor endurance, swallowing well, preseveration Weekly Focus/Goal: work on endurance, bladder emptying  Barriers to Discharge: Medical stability;Incontinence   Possible Resolutions to Barriers: Cont rehab, trial urecholine   Continued Need for Acute Rehabilitation Level of Care: The patient requires daily medical management by a physician with specialized training in physical medicine and rehabilitation for the following reasons: Direction of a multidisciplinary physical rehabilitation program to maximize functional independence : Yes Medical management of patient stability for increased activity during participation in an intensive rehabilitation regime.: Yes Analysis of laboratory values and/or radiology reports with any subsequent need for medication adjustment and/or medical intervention. : Yes   I attest that I was present, lead the team conference, and concur with the assessment and plan of the team.   Jodell Cipro M 04/09/2019, 8:39 PM   Team conference was held via web/ teleconference due to [place] - 19

## 2019-04-09 NOTE — Progress Notes (Signed)
Occupational Therapy Session Note  Patient Details  Name: Tason Larmore MRN: [id number] Date of Birth: 1951-01-10  Today's Date: 04/09/2019 OT Individual Time: 0930-1030 OT Individual Time Calculation (min): 60 min    Short Term Goals: Week 1:  OT Short Term Goal 1 (Week 1): Pt will complete LB bathing sit to stand with AE and min instructional cueing. OT Short Term Goal 2 (Week 1): Pt will complete LB dressing sit to stand with mod assist and AE PRN. OT Short Term Goal 3 (Week 1): Pt will complete toilet transfer with min assist using the RW for support. OT Short Term Goal 4 (Week 1): Pt will complete walk-in shower transfer with min assist and use of the RW for support.  Skilled Therapeutic Interventions/Progress Updates:    1:1 P tin bed when arrived. PT able to come to EOB with supervision. Pt with ongoing groans throughout session but when able to engage in conversation they stop but no reports of pain during this. Pt transferred out of bed to w/c with RW with min A with extra time to come into standing. PT then tranfsered into shower with stand pivot with grab bar with min A with more than reasonable time. Pt required mod to max cues for thoroughness and sequencing throughout session. Pt required A for washing buttocks in standing. Pt's feet continue to be swollen but unable to don TEDS due to condition of toes and pain in left toes. Pt able to don shirt with min A with encouragement due to fatigue. Introduced to using a Secondary school teacher for thread pants with max instructional cues (difficulty with generalization of use for both LEs). Pt able to thread pants but again needed A to pull pants over toes. Pt unable to don socks - requiring total A. Pt able to perform sit to stands at sink for clothing management with min A with extra time. Total A to pull up brief and pants.   Therapy Documentation Precautions:  Precautions Precautions: Fall Precaution Comments: monitor  BP Restrictions Weight Bearing Restrictions: No General:   Vital Signs: Therapy Vitals Temp: 97.9 F (36.6 C) Pulse Rate: 98 Resp: 18 BP: (!) 87/54 Patient Position (if appropriate): Standing Oxygen Therapy SpO2: 95 % O2 Device: Room Air Pain: Pain in left foot at 3rd digit with donning socks or bumming it with transfer and threading pants- MD aware of feet/ nails condition. Allowed for breaks prn  Therapy/Group: Individual Therapy  Willeen Cass [facility] 04/09/2019, 3:03 PM

## 2019-04-10 ENCOUNTER — Inpatient Hospital Stay (HOSPITAL_COMMUNITY): Payer: BC Managed Care – PPO | Admitting: Speech Pathology

## 2019-04-10 ENCOUNTER — Inpatient Hospital Stay (HOSPITAL_COMMUNITY): Payer: BC Managed Care – PPO | Admitting: Occupational Therapy

## 2019-04-10 ENCOUNTER — Inpatient Hospital Stay (HOSPITAL_COMMUNITY): Payer: BC Managed Care – PPO | Admitting: Physical Therapy

## 2019-04-10 LAB — GLUCOSE, CAPILLARY: Glucose-Capillary: 95 mg/dL (ref 70–99)

## 2019-04-10 MED ORDER — CHLORHEXIDINE GLUCONATE CLOTH 2 % EX PADS
6.0000 | MEDICATED_PAD | Freq: Every day | CUTANEOUS | Status: DC
  Administered 2019-04-10 – 2019-04-17 (×8): 6 via TOPICAL

## 2019-04-10 NOTE — Progress Notes (Signed)
Speech Language Pathology Weekly Progress and Session Note  Patient Details  Name: Jeremy Sherman MRN: [id number] Date of Birth: 06/04/1950  Beginning of progress report period: April 03, 2019 End of progress report period: April 10, 2019  Today's Date: 04/10/2019 SLP Individual Time: 2353-6144 SLP Individual Time Calculation (min): 60 min    Short Term Goals: Week 1: SLP Short Term Goal 1 (Week 1): Pt will consume current diet with minimal overt s/sx aspiration and demonstrate efficient mastication and oral clearance with Min A verbal cues for use of safe swallow strategies. SLP Short Term Goal 1 - Progress (Week 1): Met SLP Short Term Goal 2 (Week 1): Pt will consume trials of Dys 3 solids and demonstrate efficient mastication and oral clearance X3 prior to upgrade. SLP Short Term Goal 2 - Progress (Week 1): Progressing toward goal SLP Short Term Goal 3 (Week 1): Pt will demonstrate ability to problem solve during functional familiar tasks with Min A verbal/visual cues. SLP Short Term Goal 3 - Progress (Week 1): Progressing toward goal(Currently Max A to Mod A) SLP Short Term Goal 4 (Week 1): Pt will selectively attend to tasks with Min A verbal/verbal cues for redirection. SLP Short Term Goal 4 - Progress (Week 1): Progressing toward goal(Currently requires Mod A) SLP Short Term Goal 5 (Week 1): Pt will recall new and daily information with Mod A verbal/visual cues for use of compensatory strategies. SLP Short Term Goal 5 - Progress (Week 1): Not met(Currently Max A) SLP Short Term Goal 6 (Week 1): Pt will demonstrate intellectual awareness by stating 1 physical and 1 cognitive deficit with Mod A verbal/visual cues. SLP Short Term Goal 6 - Progress (Week 1): Not met(Currently Total A to Max A)    New Short Term Goals: Week 2: SLP Short Term Goal 1 (Week 2): Pt will consume trials of Dys 3 solids supervision cues for effective mastication and oral clearance X3 prior to  upgrade. SLP Short Term Goal 2 (Week 2): Pt will demonstrate ability to problem solve during functional familiar tasks with Min A verbal/visual cues. SLP Short Term Goal 3 (Week 2): Pt will selectively attend to tasks for 10 minutes with Min A verbal/verbal cues for redirection. SLP Short Term Goal 4 (Week 2): Pt will recall new and daily information with Mod A verbal/visual cues for use of compensatory strategies. SLP Short Term Goal 5 (Week 2): Pt will demonstrate intellectual awareness by stating 1 physical and 1 cognitive deficit with Mod A verbal/visual cues.  Weekly Progress Updates:   Pt is demonstrating emerging abilities with selective attention and basic problem solving tasks (currently Max A faded to Mod A). He continues to require Maximal cues for intellectual awareness and recall of information.  Pt has made progress towards his dysphagia goals. He currently consumes dysphagia 2 with thin liquids without overt s/s of dysphagia or aspiration. This reporting period, dysphagia therapy targeted snack-size trials of dysphagia 3 with pt free of overt s/s of dysphagia or aspiration. Continue to recommend skilled observation of dysphagia 3 tray before upgrading pt's diet.  Skilled ST continues to indicated to target the above mentioned deficits, increase functional independence and reduce caregiver burden.    Intensity: Minumum of 1-2 x/day, 30 to 90 minutes Frequency: 3 to 5 out of 7 days Duration/Length of Stay: 2- 2.5 weeks Treatment/Interventions: Cognitive remediation/compensation;Cueing hierarchy;Dysphagia/aspiration precaution training;Functional tasks;Patient/family education;Internal/external aids   Daily Session  Skilled Therapeutic Interventions:   Skilled treatment session targeted cognition goals. Pt demonstrated deficits  in recall and intellectual awareness of deficits as evidenced by inability to recall therapuetic activities in PT or OT sessions and he said that "everything  was the same as before" demonstrating lack of intellectual awareness. Pt was oriented to month but perseverated on being at "[employer]." Despite semantic cues and multiple choice, pt continued to state "[employer]." Pt continued to present with linguistic errors that didn't appear related to cognitive deficits. Despite semantic cues, sentence completion cues and multiple choice, pt was perseverative on saying "January 1951" for the year and then "1953." Pt didn't appear to be groping for words, pt's behavior appeared more linguistic in nature.  Given pt's linguistic errors, the Cerebellar Cognitive Affective/Schmahmann Syndromw Scale. 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  Specifically pt had difficulty with semantic fluency (2 words), phonemic fluency (3 words), category switching (no ability), verbal registration and similarities. 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 Pt left upright in bed, bed alarm on and all needs within reach.     General    Pain Pain Assessment Pain Scale: 0-10 Pain Score: 0-No pain  Therapy/Group: Individual Therapy  Quintyn Dombek 04/11/2019, 9:11 AM

## 2019-04-10 NOTE — Progress Notes (Signed)
Occupational Therapy Weekly Progress Note  Patient Details  Name: Jeremy Sherman MRN: [id number] Date of Birth: October 10, 1950  Beginning of progress report period: April 04, 2019 End of progress report period: April 10, 2019  Today's Date: 04/10/2019 OT Individual Time: 1100-1158 OT Individual Time Calculation (min): 58 min    Patient has met 4 of 4 short term goals.  Jeremy Sherman is making steady progress with OT at this time.  He is able to complete functional transfers with mod assist using the RW for support.  He is able to complete UB bathing with supervision and UB dressing with supervision to min assist.  LB bathing is at a mod assist with LB dressing at max assist.  Therapist has begun integrating AE for LB selfcare as pt demonstrates decreased ability to efficiently reach his feet for completion of thorough hygiene or with dressing tasks.  Pt still demonstrates some confusion with decreased awareness at times regarding orientation of clothing as well as remembering what he did earlier in sessions.  He continues to demonstrate decreased endurance overall and demonstrates increased moaning at times with functional movements, but it does not appear to be pain related most of the time.  Feel he will continue to benefit from CIR level OT in order to progress to a supervision level for established goals.     Patient continues to demonstrate the following deficits: muscle weakness, impaired timing and sequencing and decreased coordination, decreased awareness, decreased problem solving and decreased memory and decreased standing balance, decreased postural control and decreased balance strategies and therefore will continue to benefit from skilled OT intervention to enhance overall performance with BADL and Reduce care partner burden.  Patient progressing toward long term goals..  Continue plan of care.  OT Short Term Goals Week 2:  OT Short Term Goal 1 (Week 2): Pt will complete LB  bathing sit to stand with AE and min instructional cueing. OT Short Term Goal 2 (Week 2): Pt will complete LB dressing sit to stand with mod assist and AE PRN. OT Short Term Goal 3 (Week 2): Pt will complete toilet transfer with min assist using the RW for support. OT Short Term Goal 4 (Week 2): Pt will complete walk-in shower transfer with min assist and use of the RW for support.  Skilled Therapeutic Interventions/Progress Updates:    Pt completed supine to sit to start session with overall supervision.  Mod assist for stand pivot from the bed to the wheelchair with increased trunk flexion posture noted.  He was able to then work on UB bathing sitting at the sink with supervision.  He was able to also donn a pullover shirt with supervision but did not orient it correctly, and donned it backwards.  After therapist had him realize the mistake, he was able to donn it again at supervision level.  He next completed toilet transfer with use of the RW for support and min instructional cueing for hand placement.  He was able to complete toilet hygiene in standing, but needed mod assist for thoroughness.  He ambulated back to the wheelchair at mod assist and was taken out to the sink for washing his hands and for donning clothing.  Min assist was needed for threading his brief and pants with use of the reacher.  Mod assist for standing to pull them up over his hips.  Finished session with pt sitting up in the wheelchair and with call button and phone in reach.  Safety belt in place as  well.    Therapy Documentation Precautions:  Precautions Precautions: Fall Precaution Comments: monitor BP Restrictions Weight Bearing Restrictions: No  Pain:  No report of pain during session  ADL: See Care Tool Section for some details of mobility and selfcare  Therapy/Group: Individual Therapy  Jeremy Sherman OTR/L 04/10/2019, 4:12 PM

## 2019-04-10 NOTE — Progress Notes (Signed)
Physical Therapy Weekly Progress Note  Patient Details  Name: Jeremy Sherman MRN: [id number] Date of Birth: [birth year]  Beginning of progress report period: April 04, 2019 End of progress report period: April 10, 2019  Today's Date: 04/10/2019 PT Individual Time: 2979-8921 PT Individual Time Calculation (min): 75 min   Patient has met 4 of 4 short term goals.  Jeremy Sherman is progressing well with therapy demonstrating improving activity tolerance, functional mobility of transfers and gait, as well as improving LE strength. Although, he has been limited by intermittent orthostatic hypotension during standing/ambulatory tasks. He can perform supine<>sit with up to mod assist when not using bed features, sit<>stand and stand pivot transfers using RW with varying min/mod assist pending height of chair, and ambulating up to 21f using RW with min assist and intermittent +2 assist for safety.   Patient continues to demonstrate the following deficits muscle weakness and muscle joint tightness, decreased cardiorespiratoy endurance, impaired timing and sequencing, unbalanced muscle activation and decreased motor planning, decreased attention to left, decreased initiation, decreased attention, decreased awareness, decreased problem solving, decreased safety awareness, decreased memory and delayed processing and decreased standing balance, decreased postural control and decreased balance strategies and therefore will continue to benefit from skilled PT intervention to increase functional independence with mobility.  Patient progressing toward long term goals..  Continue plan of care.  PT Short Term Goals Week 1:  PT Short Term Goal 1 (Week 1): Pt will consistenly perform sit<>stands using LRAD with mod assist PT Short Term Goal 1 - Progress (Week 1): Met PT Short Term Goal 2 (Week 1): Pt will consistently perform stand pivot transfers using LRAD with mod assist PT Short Term Goal 2 -  Progress (Week 1): Met PT Short Term Goal 3 (Week 1): Pt will ambulate at least 210fusing LRAD with mod assist PT Short Term Goal 3 - Progress (Week 1): Met PT Short Term Goal 4 (Week 1): Pt will initiate stair training PT Short Term Goal 4 - Progress (Week 1): Met Week 2:  PT Short Term Goal 1 (Week 2): Pt will consistently perform supine<>sit with min assist PT Short Term Goal 2 (Week 2): Pt will perform bed<>chair transfers using LRAD with CGA PT Short Term Goal 3 (Week 2): Pt will consistently ambulate at least 5046fsing LRAD with min assist of 1 person PT Short Term Goal 4 (Week 2): Pt will ascend/descend 2 steps using handrails with mod assist  Skilled Therapeutic Interventions/Progress Updates:  Ambulation/gait training;Community reintegration;DME/adaptive equipment instruction;Neuromuscular re-education;Psychosocial support;Stair training;UE/LE Strength taining/ROM;Wheelchair propulsion/positioning;Balance/vestibular training;Discharge planning;Functional electrical stimulation;Pain management;Skin care/wound management;Therapeutic Activities;UE/LE Coordination activities;Cognitive remediation/compensation;Disease management/prevention;Functional mobility training;Patient/family education;Splinting/orthotics;Therapeutic Exercise;Visual/perceptual remediation/compensation   Pt received sitting in w/c and agreeable to therapy session. Transported to/from gym in w/c. Sitting vitals: BP 117/62 (MAP 77), HR 76bpm Sit>stands w/c/EOM>RW with varying level of assist with pt requiring up to a heavy min assist to lift up into standing from mat and min/CGA for lifting from w/c with armrests throughout session. Standing vitals: BP 115/66 (MAP 78), HR 92bpm with pt reporting little lightheadedness. Gait training [id number] (seated break between) using RW with CGA/min assist for balance - demonstrates decreased gait speed, decreased  BLE step length, forward trunk flexion and sustained hip flexed posture:  vitals assessed after each walk 1st: BP 97/68 (MAP 79), HR 87bpm with pt c/o lightheadedness recovered to BP 135/68 (MAP 85), HR 82bpm during seated rest break prior to 2nd walk and then after 2nd walk: BP 112/65 (MAP 81), HR  86bpm with pt reporting lightheadedness again. Provided seated rest break for symptom dissipation. Standing>tall kneeling on mat with min assist for balance and B LE management onto mat. Tall kneeling with B UE support on bench attempting to put no UE support with pt requiring min assist for balance and pt able to reach and grab 1 horseshoe prior to requesting to get out of the position - tall kneeling>sitting in w/c with min assist for B LE management off mat and balance. Pt unable to explain why he needed to get out of the position despite questioning and agreeable to attempt a second time. Returned to tall kneeling as described above. Pt able to complete task of reaching for horseshoes from elevated surface with CGA/min assist for balance but required 1 UE support on bench at all time for trunk control due to weak trunk/hip extensors despite cuing and assist to remove UE support from bench. Returned to sitting in w/c from tall kneeling as described above. Stand pivot w/c<>EOM using RW with min assist for lifting into standing and CGA for steadying while turning. Repeated sit<>stands extremely elevated EOM without UE support x5 reps with CGA/min assist - pt reports B LE quad muscle pain (pt unable to clearly describe it so deferred further repetitions). Standing with B UE support on RW performed alternate B LE foot taps on 6" step with pt able to complete ~6 reps then requesting to return to sitting. Pt reports he is feeling lightheaded - attempted to take BP in sitting but dynamap was taking too long for a reading and pt appearing to not feel well therefore assisted pt to supine with mod/max assist for trunk descent and B LE management - in supine able to get vitals: BP 111/71 (MAP 84), HR  81bpm. Pt moaning/groaning significantly while lying on mat and states "I hurt all over" and then states "I ain't never felt this bad" when asking clarifying questions he responded "I don't know."  After supine therapeutic rest break for symptom dissipation: supine>sit with min assist for B LE management and trunk upright with max multimodal cuing for sequencing of logroll technique to increase pt independence. Stand pivot to w/c using RW with min assist. Transported back to room in w/c. Stand pivot w/c>EOB using RW with min assist for lifting into standing and CGA when turning. Sit>supine using bedrail for support with min assist for B LE management. Pt left supine in bed in the care of Mekides, RN who was notified of pt's decreased BP and lightheadedness during therapy.  Therapy Documentation Precautions:  Precautions Precautions: Fall Precaution Comments: monitor BP Restrictions Weight Bearing Restrictions: No  Pain:   Continues to moan/groan during therapy but repeatedly denies pain until end of session when pt reports "I hurt all over" - unable to clarify further despite questioning.  Therapy/Group: Individual Therapy  Tawana Scale, PT, DPT 04/10/2019, 1:01 PM

## 2019-04-11 ENCOUNTER — Inpatient Hospital Stay (HOSPITAL_COMMUNITY): Payer: BC Managed Care – PPO | Admitting: Speech Pathology

## 2019-04-11 ENCOUNTER — Inpatient Hospital Stay (HOSPITAL_COMMUNITY): Payer: BC Managed Care – PPO | Admitting: Physical Therapy

## 2019-04-11 ENCOUNTER — Inpatient Hospital Stay (HOSPITAL_COMMUNITY): Payer: BC Managed Care – PPO | Admitting: Occupational Therapy

## 2019-04-11 LAB — GLUCOSE, CAPILLARY
Glucose-Capillary: 104 mg/dL — ABNORMAL HIGH (ref 70–99)
Glucose-Capillary: 91 mg/dL (ref 70–99)
Glucose-Capillary: 130 mg/dL — ABNORMAL HIGH (ref 70–99)

## 2019-04-11 NOTE — Progress Notes (Signed)
Patient states his "feet hurt while walking and doing therapy due to the overgrowth of nails and calluses". Patient grimaced in pain while putting on socks. Patient was wondering if we could consult a podiatrist to come look at his feet to determine treatment to help with the pain. Writer told patient they would make a note in the chart so doctor could address. Will continue plan of care. [place]

## 2019-04-11 NOTE — Progress Notes (Signed)
Occupational Therapy Session Note  Patient Details  Name: Jeremy Sherman MRN: [id number] Date of Birth: [birth year]  Today's Date: 04/11/2019 OT Individual Time: [id number] OT Individual Time Calculation (min): 71 min    Short Term Goals: Week 2:  OT Short Term Goal 1 (Week 2): Pt will complete LB bathing sit to stand with AE and min instructional cueing. OT Short Term Goal 2 (Week 2): Pt will complete LB dressing sit to stand with mod assist and AE PRN. OT Short Term Goal 3 (Week 2): Pt will complete toilet transfer with min assist using the RW for support. OT Short Term Goal 4 (Week 2): Pt will complete walk-in shower transfer with min assist and use of the RW for support.  Skilled Therapeutic Interventions/Progress Updates:    Pt completed sit to stand transitions in the therapy gym with use of the Dyn[employer] for UE coordination.  He was able to complete several sit to stands with min assist and mod instructional cueing for hand placement to push up from the arms of the chair.  He was able to complete intervals with an average of 2.7 seconds for each light using both hands, 2.61 seconds with just the left hand, and 3.33 seconds with the right while standing.  Noted greater difficulty with coordination in the right hand compared to the left.  When asked to complete intervals using the RUE only with a 4 second time limit, he was able to get 20-22 but then only 13/19.  Transitioned to use of the UE ergonometer in order to increase strength, coordination, and endurance.  Pt was placed on Random Program with intensity on level 8.  He was able to complete the first set for 5 mins with RPMs maintained at 25.  The 2nd and 3rd sets were completed for 4 mins and 3 mins secondary to pt fatigue and inability to keep going.  After each set O2 sats and HR were monitored and maintained at 95-98% on room air with HR at 80-84.  Finished session with return to the room via wheelchair and pt sitting up in  preparation for lunch.  Call button and phone in reach with safety belt in place.    Therapy Documentation Precautions:  Precautions Precautions: Fall Precaution Comments: monitor BP Restrictions Weight Bearing Restrictions: No  Pain: Pain Assessment Pain Scale: Faces Pain Score: 0-No pain ADL: See Care Tool Section for some details of mobility and selfcare  Therapy/Group: Individual Therapy  Courtnee Myer OTR/L 04/11/2019, 12:24 PM

## 2019-04-11 NOTE — Progress Notes (Signed)
Speech Language Pathology Daily Session Note  Patient Details  Name: Jeremy Sherman MRN: [id number] Date of Birth: [birth year]  Today's Date: 04/11/2019 SLP Individual Time: 1350-1430 SLP Individual Time Calculation (min): 40 min  Short Term Goals: Week 1: SLP Short Term Goal 1 (Week 1): Pt will consume current diet with minimal overt s/sx aspiration and demonstrate efficient mastication and oral clearance with Min A verbal cues for use of safe swallow strategies. SLP Short Term Goal 1 - Progress (Week 1): Met SLP Short Term Goal 2 (Week 1): Pt will consume trials of Dys 3 solids and demonstrate efficient mastication and oral clearance X3 prior to upgrade. SLP Short Term Goal 2 - Progress (Week 1): Progressing toward goal SLP Short Term Goal 3 (Week 1): Pt will demonstrate ability to problem solve during functional familiar tasks with Min A verbal/visual cues. SLP Short Term Goal 3 - Progress (Week 1): Progressing toward goal(Currently Max A to Mod A) SLP Short Term Goal 4 (Week 1): Pt will selectively attend to tasks with Min A verbal/verbal cues for redirection. SLP Short Term Goal 4 - Progress (Week 1): Progressing toward goal(Currently requires Mod A) SLP Short Term Goal 5 (Week 1): Pt will recall new and daily information with Mod A verbal/visual cues for use of compensatory strategies. SLP Short Term Goal 5 - Progress (Week 1): Not met(Currently Max A) SLP Short Term Goal 6 (Week 1): Pt will demonstrate intellectual awareness by stating 1 physical and 1 cognitive deficit with Mod A verbal/visual cues. SLP Short Term Goal 6 - Progress (Week 1): Not met(Currently Total A to Max A)  Skilled Therapeutic Interventions:    Skilled treatment session targeted cognition goals. SLP facilitated session by providing Corena Pilgrim craft that targeted selective attention, problem solving and awareness of deficits. Pt able to selectively attend to task for ~ 20 minutes with Mod I.  With Min A  to organize task, pt able to problem solve steps within task. Pt with difficulty tracing dotted message. with moderate assistance pt able to state that he right hand "didn't work" as he was not able to write letters or trace letters. Pt frequently replied to general questions with semantic errors. For example, he stated his wife's name was "Bale" and then when further prompted he stated his full name.  Pt with perseveartive responses. For example for location he stated "[facility]" and was unable to break perseveration. SLP provided that we were in [place] as cue to help break perseveration. Pt responded "[facility]" and despite cues, pt perseverated on this phrase, he did know that he was providing wrong answer and was being repetitive. SLP provided multiple choices but pt continued to respond "[facility]." Pt left upright in wheelchair, lap belt alarm on and all needs within reach.   Pain Pain Assessment Pain Scale: Faces Pain Score: 0-No pain  Therapy/Group: Individual Therapy  Justiss Gerbino 04/11/2019, 2:18 PM

## 2019-04-11 NOTE — Progress Notes (Addendum)
[place] PHYSICAL MEDICINE & REHABILITATION PROGRESS NOTE   Subjective/Complaints:  Pt happy that foley has been replaced PT notes orthostasis during therapy  ROS- no swallowing issues, no CP, no SOB, no N/V/D  Objective:   No results found. No results for input(s): WBC, HGB, HCT, PLT in the last 72 hours. No results for input(s): NA, K, CL, CO2, GLUCOSE, BUN, CREATININE, CALCIUM in the last 72 hours.  Intake/Output Summary (Last 24 hours) at 04/11/2019 0821 Last data filed at 04/11/2019 0500 Gross per 24 hour  Intake 400 ml  Output 1400 ml  Net -1000 ml     Physical Exam: Vital Signs Blood pressure 113/83, pulse 75, temperature 98.2 F (36.8 C), temperature source Oral, resp. rate 16, height 6\' 5"  (1.956 m), weight 106.9 kg, SpO2 99 %.   General: No acute distress;sleepy; appropriate; asleep initially, but woke to verbal stimuli; NAD; sitting in wheelchair at bedside Mood and affect are appropriate Heart: Regular rate and rhythm no rubs murmurs or extra sounds Lungs: Clear to auscultation, breathing unlabored, no rales or wheezes Abdomen: Positive bowel sounds, soft nontender to palpation, nondistended Extremities: No clubbing, cyanosis, or edema Skin: No evidence of breakdown, no evidence of rash Neurologic:, motor strength is 4/5 in right and 5/5 left deltoid, bicep, tricep, grip, hip flexor, knee extensors, ankle dorsiflexor and plantar flexor Sensory exam normal sensation to light touch and proprioception in bilateral upper and lower extremities Cerebellar exam normal finger to nose to finger as well as heel to shin in bilateral upper and lower extremities Musculoskeletal: Full range of motion in all 4 extremities. No joint swelling    Assessment/Plan: 1. Functional deficits secondary to L ACA aneurysm, MCA  Stenosis causing Left hemispheric ischemia which require 3+ hours per day of interdisciplinary therapy in a comprehensive inpatient rehab  setting.  Physiatrist is providing close team supervision and 24 hour management of active medical problems listed below.  Physiatrist and rehab team continue to assess barriers to discharge/monitor patient progress toward functional and medical goals  Care Tool:  Bathing    Body parts bathed by patient: Right arm, Left arm, Chest, Abdomen, Right upper leg, Left upper leg, Face, Front perineal area   Body parts bathed by helper: Buttocks Body parts n/a: Left lower leg, Right lower leg   Bathing assist Assist Level: Moderate Assistance - Patient 50 - 74%     Upper Body Dressing/Undressing Upper body dressing   What is the patient wearing?: Pull over shirt    Upper body assist Assist Level: Supervision/Verbal cueing    Lower Body Dressing/Undressing Lower body dressing      What is the patient wearing?: Pants, Incontinence brief     Lower body assist Assist for lower body dressing: Maximal Assistance - Patient 25 - 49%     Toileting Toileting    Toileting assist Assist for toileting: Maximal Assistance - Patient 25 - 49%     Transfers Chair/bed transfer  Transfers assist     Chair/bed transfer assist level: Moderate Assistance - Patient 50 - 74%     Locomotion Ambulation   Ambulation assist   Ambulation activity did not occur: Safety/medical concerns  Assist level: Minimal Assistance - Patient > 75% Assistive device: Walker-rolling Max distance: 55ft   Walk 10 feet activity   Assist  Walk 10 feet activity did not occur: Safety/medical concerns  Assist level: Minimal Assistance - Patient > 75% Assistive device: Walker-rolling   Walk 50 feet activity   Assist Walk 50  feet with 2 turns activity did not occur: Safety/medical concerns  Assist level: Minimal Assistance - Patient > 75% Assistive device: Walker-rolling    Walk 150 feet activity   Assist Walk 150 feet activity did not occur: Safety/medical concerns         Walk 10 feet on  uneven surface  activity   Assist Walk 10 feet on uneven surfaces activity did not occur: Safety/medical concerns         Wheelchair     Assist Will patient use wheelchair at discharge?: (TBD)             Wheelchair 50 feet with 2 turns activity    Assist            Wheelchair 150 feet activity     Assist          Blood pressure 113/83, pulse 75, temperature 98.2 F (36.8 C), temperature source Oral, resp. rate 16, height 6\' 5"  (1.956 m), weight 106.9 kg, SpO2 99 %.    Medical Problem List and Plan:  1. 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 Antithrombotics:  -DVT/anticoagulation: Eliquis  -antiplatelet therapy: Brilinta  3. Pain Management: Tramadol as needed  4. Mood: Provide emotional support  -antipsychotic agents: N/A  5. Neuropsych: This patient is capable of making decisions on his own behalf.  6. Skin/Wound Care: Routine skin checks  7. Fluids/Electrolytes/Nutrition: Routine in and outs with follow-up chemistries  8. Hypertension. Patient on no home med antihypertensive medications. Monitor with increased mobility  Vitals:   04/10/19 2049 04/11/19 0524  BP: 124/66 113/83  Pulse: 74 75  Resp: 18 16  Temp: 98.7 F (37.1 C) 98.2 F (36.8 C)  SpO2: 100% 99%  Orthostatic hypotension will stop flomax and check daily ortho BPs, may ACE wrap legs 9. Hyperlipidemia. Lipitor  10. Tobacco abuse. Counseling  11. Prediabetes. Hemoglobin A1c 6.4. SSI. Patient on no diabetic agents prior to admission. Well controlled no meds CBG (last 3)  Recent Labs    04/09/19 1813 04/10/19 0609 04/11/19 0702  GLUCAP 87 95 104*    Controlled 2/9 continue CBG BID  12. BPH /UTI. Flomax 0.4 mg daily. Urinary retention with painful caths accompanied by hematuria , foley replaced, f/u with urology 13. Hypothyroidism. TSH 45! Synthroid started. Patient will need follow-up thyroid panel in 3 months  2/7- likely reason sleeping so much.   34. Per chart, severe PAD with R foot ischemia- has LE foot/ankle pain on exam that is likely c/w PAD- will monitor closely for skin breakdown, etc.  15.  Onychogryphosis/mycosis- severe OP DPM f/u     LOS: 8 days A FACE TO FACE EVALUATION WAS PERFORMED  Charlett Blake 04/11/2019, 8:21 AM

## 2019-04-11 NOTE — Progress Notes (Signed)
Physical Therapy Session Note  Patient Details  Name: Jeremy Sherman MRN: [id number] Date of Birth: 1950/09/23  Today's Date: 04/11/2019 PT Individual Time: 0850-1004 PT Individual Time Calculation (min): 74 min   Short Term Goals: Week 2:  PT Short Term Goal 1 (Week 2): Pt will consistently perform supine<>sit with min assist PT Short Term Goal 2 (Week 2): Pt will perform bed<>chair transfers using LRAD with CGA PT Short Term Goal 3 (Week 2): Pt will consistently ambulate at least 46ft using LRAD with min assist of 1 person PT Short Term Goal 4 (Week 2): Pt will ascend/descend 2 steps using handrails with mod assist  Skilled Therapeutic Interventions/Progress Updates:   Therapist spoke with MD regarding pt's orthostatic hypotension during therapy sessions. Pt received supine in bed and noticed not to have eaten breakfast but agreeable to therapy session prior to eating. Supine>sit, HOB partially elevated and using bedrails, with supervision and increased time. Sitting EOB therapist donned ACE wraps to B LEs to assist with BP management. Sitting EOB donned socks max/total assist, shoes max/total assist, pants with min/mod assist, and jacket with supervision.Sit>stand elevated EOB>RW with CGA and increased time - cuing for proper UE placement with AD. Stand pivot EOB>w/c using RW with CGA for steadying.  Transported to/from gym in w/c. Gait training ~22ft using RW x2 (seated break between) using RW with CGA/min assist for steadying - continues to demonstrate hip flexed posturing with decreased gait speed, decreased speed of movement, and decreased B LE step length - after 1st walk reported feeling SOB SpO2 99% and HR 75bpm then after 2nd walk reported lightheadedness BP 121/71 (MAP 84), HR 76bpm, SpO2 100% - seated break for symptom dissipation. Ascended/descended 4 steps using B HRs with min assist for balance and pt using step-to pattern - required brief standing rest break at top prior  to descent - pt reports feeling SOB after but therapist had to specifically inquire and pt reported he also felt lightheaded because with generic questioning pt said he felt "fine" - vitals: BP 129/62 (MAP 82), HR 77bpm, SpO2 100%. Pt's BP appears to be managed better today with ACE wraps. Stand pivot w/c<>EOM using RW with CGA for steadying and pt demonstrating improving recall of proper UE placement on AD. Sit>supine with significantly increased time and min assist to place LEs onto mat. Supine hip flexor stretch x2 minutes. Supine bridging x15reps with cuing for improved hip extension and pt demonstrating improved clearance compared to last time performing this exercise with this therapist. Supine>sit with min assist for trunk upright and max multimodal cuing for sequencing of logroll technique to increase pt independence. Stand pivot to w/c using RW with CGA for steadying and cuing again for proper UE placement with AD. Transported back to room and left seated in w/c with needs in reach and seat belt alarm on. RN notified pt back to room to assist with meal.  Therapy Documentation Precautions:  Precautions Precautions: Fall Precaution Comments: monitor BP Restrictions Weight Bearing Restrictions: No  Pain: Noticed some grimacing while donning socks/shoes - adjusted technique for pt comfort.    Therapy/Group: Individual Therapy  Tawana Scale, PT, DPT 04/11/2019, 7:53 AM

## 2019-04-12 ENCOUNTER — Inpatient Hospital Stay (HOSPITAL_COMMUNITY): Payer: BC Managed Care – PPO | Admitting: Speech Pathology

## 2019-04-12 LAB — GLUCOSE, CAPILLARY
Glucose-Capillary: 104 mg/dL — ABNORMAL HIGH (ref 70–99)
Glucose-Capillary: 103 mg/dL — ABNORMAL HIGH (ref 70–99)

## 2019-04-12 NOTE — Progress Notes (Signed)
[place] PHYSICAL MEDICINE & REHABILITATION PROGRESS NOTE   Subjective/Complaints:  Foot pain with donning doffing socks, will need podiatry to see after  D/c (no podiatry consult available  in hospital)   ROS- no swallowing issues, no CP, no SOB, no N/V/D  Objective:   No results found. No results for input(s): WBC, HGB, HCT, PLT in the last 72 hours. No results for input(s): NA, K, CL, CO2, GLUCOSE, BUN, CREATININE, CALCIUM in the last 72 hours.  Intake/Output Summary (Last 24 hours) at 04/12/2019 [id number] Last data filed at 04/12/2019 0559 Gross per 24 hour  Intake --  Output 900 ml  Net -900 ml     Physical Exam: Vital Signs Blood pressure (!) 146/88, pulse 69, temperature 98.1 F (36.7 C), temperature source Oral, resp. rate 16, height 6\' 5"  (1.956 m), weight 106.9 kg, SpO2 99 %.   General: No acute distress;sleepy; appropriate; asleep initially, but woke to verbal stimuli; NAD; sitting in wheelchair at bedside Mood and affect are appropriate Heart: Regular rate and rhythm no rubs murmurs or extra sounds Lungs: Clear to auscultation, breathing unlabored, no rales or wheezes Abdomen: Positive bowel sounds, soft nontender to palpation, nondistended Extremities: No clubbing, cyanosis, or edema Skin: No evidence of breakdown, no evidence of rash Neurologic:, motor strength is 4/5 in right and 5/5 left deltoid, bicep, tricep, grip, hip flexor, knee extensors, ankle dorsiflexor and plantar flexor Sensory exam normal sensation to light touch and proprioception in bilateral upper and lower extremities Cerebellar exam normal finger to nose to finger as well as heel to shin in bilateral upper and lower extremities Musculoskeletal: Full range of motion in all 4 extremities. No joint swelling    Assessment/Plan: 1. Functional deficits secondary to L ACA aneurysm, MCA  Stenosis causing Left hemispheric ischemia which require 3+ hours per day of interdisciplinary therapy in a  comprehensive inpatient rehab setting.  Physiatrist is providing close team supervision and 24 hour management of active medical problems listed below.  Physiatrist and rehab team continue to assess barriers to discharge/monitor patient progress toward functional and medical goals  Care Tool:  Bathing    Body parts bathed by patient: Right arm, Left arm, Chest, Abdomen, Right upper leg, Left upper leg, Face, Front perineal area   Body parts bathed by helper: Buttocks Body parts n/a: Left lower leg, Right lower leg   Bathing assist Assist Level: Moderate Assistance - Patient 50 - 74%     Upper Body Dressing/Undressing Upper body dressing   What is the patient wearing?: Pull over shirt    Upper body assist Assist Level: Supervision/Verbal cueing    Lower Body Dressing/Undressing Lower body dressing      What is the patient wearing?: Pants, Incontinence brief     Lower body assist Assist for lower body dressing: Maximal Assistance - Patient 25 - 49%     Toileting Toileting    Toileting assist Assist for toileting: Maximal Assistance - Patient 25 - 49%     Transfers Chair/bed transfer  Transfers assist     Chair/bed transfer assist level: Minimal Assistance - Patient > 75%     Locomotion Ambulation   Ambulation assist   Ambulation activity did not occur: Safety/medical concerns  Assist level: Minimal Assistance - Patient > 75% Assistive device: Walker-rolling Max distance: 90ft   Walk 10 feet activity   Assist  Walk 10 feet activity did not occur: Safety/medical concerns  Assist level: Minimal Assistance - Patient > 75% Assistive device: Walker-rolling  Walk 50 feet activity   Assist Walk 50 feet with 2 turns activity did not occur: Safety/medical concerns  Assist level: Minimal Assistance - Patient > 75% Assistive device: Walker-rolling    Walk 150 feet activity   Assist Walk 150 feet activity did not occur: Safety/medical concerns          Walk 10 feet on uneven surface  activity   Assist Walk 10 feet on uneven surfaces activity did not occur: Safety/medical concerns         Wheelchair     Assist Will patient use wheelchair at discharge?: (TBD)             Wheelchair 50 feet with 2 turns activity    Assist            Wheelchair 150 feet activity     Assist          Blood pressure (!) 146/88, pulse 69, temperature 98.1 F (36.7 C), temperature source Oral, resp. rate 16, height 6\' 5"  (1.956 m), weight 106.9 kg, SpO2 99 %.    Medical Problem List and Plan:  1. 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 Antithrombotics:  -DVT/anticoagulation: Eliquis  -antiplatelet therapy: Brilinta  3. Pain Management: Tramadol as needed  4. Mood: Provide emotional support  -antipsychotic agents: N/A  5. Neuropsych: This patient is capable of making decisions on his own behalf.  6. Skin/Wound Care: Routine skin checks  7. Fluids/Electrolytes/Nutrition: Routine in and outs with follow-up chemistries  8. Hypertension. Patient on no home med antihypertensive medications. Monitor with increased mobility  Vitals:   04/11/19 2107 04/12/19 0552  BP: (!) 144/76 (!) 146/88  Pulse: 77 69  Resp: 18 16  Temp: 98.7 F (37.1 C) 98.1 F (36.7 C)  SpO2: 99% 99%  Orthostatic hypotension will stop flomax and check daily ortho BPs, may ACE wrap legs 9. Hyperlipidemia. Lipitor  10. Tobacco abuse. Counseling  11. Prediabetes. Hemoglobin A1c 6.4. SSI. Patient on no diabetic agents prior to admission. Well controlled no meds CBG (last 3)  Recent Labs    04/11/19 1719  04/11/19 2110 04/12/19 0603  GLUCAP 91 130* 104*   Controlled 2/9 continue CBG BID  12. BPH /UTI. Flomax 0.4 mg daily. Urinary retention with painful caths accompanied by hematuria , foley replaced, f/u with urology 13. Hypothyroidism. TSH 45! Synthroid started. Patient will need follow-up thyroid panel in 3 months  2/7- likely reason sleeping so much.   31. Per chart, severe PAD with R foot ischemia- has LE foot/ankle pain on exam that is likely c/w PAD- will monitor closely for skin breakdown, etc.  15.  Onychogryphosis/mycosis- severe OP DPM f/u as they are not availabe for consults in hospital     LOS: 9 days A FACE TO FACE EVALUATION WAS PERFORMED  Jeremy Sherman 04/12/2019, 8:22 AM

## 2019-04-12 NOTE — Progress Notes (Signed)
Speech Language Pathology Daily Session Note  Patient Details  Name: Jeremy Sherman MRN: [id number] Date of Birth: January 30, 1951  Today's Date: 04/12/2019 SLP Individual Time: 1510-1600 SLP Individual Time Calculation (min): 50 min  Short Term Goals: Week 2: SLP Short Term Goal 1 (Week 2): Pt will consume trials of Dys 3 solids supervision cues for effective mastication and oral clearance X3 prior to upgrade. SLP Short Term Goal 2 (Week 2): Pt will demonstrate ability to problem solve during functional familiar tasks with Min A verbal/visual cues. SLP Short Term Goal 3 (Week 2): Pt will selectively attend to tasks for 10 minutes with Min A verbal/verbal cues for redirection. SLP Short Term Goal 4 (Week 2): Pt will recall new and daily information with Mod A verbal/visual cues for use of compensatory strategies. SLP Short Term Goal 5 (Week 2): Pt will demonstrate intellectual awareness by stating 1 physical and 1 cognitive deficit with Mod A verbal/visual cues.  Skilled Therapeutic Interventions:  Pt was seen for skilled ST targeting cognitive goals.  SLP facilitated the session with medication management tasks to address goals for problem solving and selective attention to task.  Pt was largely unfamiliar with the medications on his current regimen as they are new to him (per report).  SLP provided pt with a written handout of medications, their function, and frequency to maximize carryover in between therapy sessions.  Pt needed mod assist verbal and visual cues for task organization and error awareness when loading pills into a 4x daily pill box.  SLP recommended that pt have assistance with medication management at discharge in order to minimize risk for error.  Pt reports that his wife will be able to help him at discharge.  Pt was left in bed with bed alarm set and call bell within reach.  Continue per current plan of care.    Pain Pain Assessment Pain Scale: 0-10 Pain Score: 0-No  pain  Therapy/Group: Individual Therapy  Toni Demo, Selinda Orion 04/12/2019, 3:54 PM

## 2019-04-13 ENCOUNTER — Inpatient Hospital Stay (HOSPITAL_COMMUNITY): Payer: BC Managed Care – PPO

## 2019-04-13 LAB — GLUCOSE, CAPILLARY: Glucose-Capillary: 95 mg/dL (ref 70–99)

## 2019-04-13 NOTE — Progress Notes (Signed)
PHYSICAL MEDICINE & REHABILITATION PROGRESS NOTE   Subjective/Complaints:    ROS- no swallowing issues, no CP, no SOB, no N/V/D  Objective:   No results found. No results for input(s): WBC, HGB, HCT, PLT in the last 72 hours. No results for input(s): NA, K, CL, CO2, GLUCOSE, BUN, CREATININE, CALCIUM in the last 72 hours.  Intake/Output Summary (Last 24 hours) at 04/13/2019 [id number] Last data filed at 04/13/2019 0443 Gross per 24 hour  Intake 180 ml  Output 1075 ml  Net -895 ml     Physical Exam: Vital Signs Blood pressure (!) 154/78, pulse 69, temperature 97.8 F (36.6 C), resp. rate 19, height 6\' 5"  (1.956 m), weight 106.9 kg, SpO2 100 %.   General: No acute distress;sleepy; appropriate; asleep initially, but woke to verbal stimuli; NAD; sitting in wheelchair at bedside Mood and affect are appropriate Heart: Regular rate and rhythm no rubs murmurs or extra sounds Lungs: Clear to auscultation, breathing unlabored, no rales or wheezes Abdomen: Positive bowel sounds, soft nontender to palpation, nondistended Extremities: No clubbing, cyanosis, or edema Skin: No evidence of breakdown, no evidence of rash Neurologic:, motor strength is 4/5 in right and 5/5 left deltoid, bicep, tricep, grip, hip flexor, knee extensors, ankle dorsiflexor and plantar flexor Sensory exam normal sensation to light touch and proprioception in bilateral upper and lower extremities Cerebellar exam normal finger to nose to finger as well as heel to shin in bilateral upper and lower extremities Musculoskeletal: Full range of motion in all 4 extremities. No joint swelling    Assessment/Plan: 1. Functional deficits secondary to L ACA aneurysm, MCA  Stenosis causing Left hemispheric ischemia which require 3+ hours per day of interdisciplinary therapy in a comprehensive inpatient rehab setting.  Physiatrist is providing close team supervision and 24 hour management of active medical problems listed  below.  Physiatrist and rehab team continue to assess barriers to discharge/monitor patient progress toward functional and medical goals  Care Tool:  Bathing    Body parts bathed by patient: Right arm, Left arm, Chest, Abdomen, Right upper leg, Left upper leg, Face, Front perineal area   Body parts bathed by helper: Buttocks Body parts n/a: Left lower leg, Right lower leg   Bathing assist Assist Level: Moderate Assistance - Patient 50 - 74%     Upper Body Dressing/Undressing Upper body dressing   What is the patient wearing?: Pull over shirt    Upper body assist Assist Level: Supervision/Verbal cueing    Lower Body Dressing/Undressing Lower body dressing      What is the patient wearing?: Pants, Incontinence brief     Lower body assist Assist for lower body dressing: Maximal Assistance - Patient 25 - 49%     Toileting Toileting    Toileting assist Assist for toileting: Maximal Assistance - Patient 25 - 49%     Transfers Chair/bed transfer  Transfers assist     Chair/bed transfer assist level: Minimal Assistance - Patient > 75%     Locomotion Ambulation   Ambulation assist   Ambulation activity did not occur: Safety/medical concerns  Assist level: Minimal Assistance - Patient > 75% Assistive device: Walker-rolling Max distance: 83ft   Walk 10 feet activity   Assist  Walk 10 feet activity did not occur: Safety/medical concerns  Assist level: Minimal Assistance - Patient > 75% Assistive device: Walker-rolling   Walk 50 feet activity   Assist Walk 50 feet with 2 turns activity did not occur: Safety/medical concerns  Assist level: Minimal  Assistance - Patient > 75% Assistive device: Walker-rolling    Walk 150 feet activity   Assist Walk 150 feet activity did not occur: Safety/medical concerns         Walk 10 feet on uneven surface  activity   Assist Walk 10 feet on uneven surfaces activity did not occur: Safety/medical  concerns         Wheelchair     Assist Will patient use wheelchair at discharge?: (TBD)             Wheelchair 50 feet with 2 turns activity    Assist            Wheelchair 150 feet activity     Assist          Blood pressure (!) 154/78, pulse 69, temperature 97.8 F (36.6 C), resp. rate 19, height 6\' 5"  (1.956 m), weight 106.9 kg, SpO2 100 %.    Medical Problem List and Plan:  1. 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 Antithrombotics:  -DVT/anticoagulation: Eliquis  -antiplatelet therapy: Brilinta  3. Pain Management: Tramadol as needed  4. Mood: Provide emotional support  -antipsychotic agents: N/A  5. Neuropsych: This patient is capable of making decisions on his own behalf.  6. Skin/Wound Care: Routine skin checks  7. Fluids/Electrolytes/Nutrition: Routine in and outs with follow-up chemistries  8. Hypertension. Patient on no home med antihypertensive medications. Monitor with increased mobility  Vitals:   04/12/19 1940 04/13/19 0426  BP: (!) 132/51 (!) 154/78  Pulse: 72 69  Resp: 19 19  Temp: 98.8 F (37.1 C) 97.8 F (36.6 C)  SpO2: 99% 100%  Orthostatic hypotension will stop flomax and Urecholine and check daily ortho BPs, may ACE wrap legs 9. Hyperlipidemia. Lipitor  10. Tobacco abuse. Counseling  11. Prediabetes. Hemoglobin A1c 6.4. SSI. Patient on no diabetic agents prior to admission. Well controlled no meds CBG (last 3)  Recent Labs    04/12/19 0603 04/12/19 1817 04/13/19 0617  GLUCAP 104* 103* 95   Controlled 2/14 continue CBG BID  12. BPH /UTI. Flomax 0.4 mg daily. Urinary retention with  painful caths accompanied by hematuria , foley replaced, f/u with urology 13. Hypothyroidism. TSH 45! Synthroid started. Patient will need follow-up thyroid panel in 3 months  2/7- likely reason sleeping so much.   92. Per chart, severe PAD with R foot ischemia- has LE foot/ankle pain on exam that is likely c/w PAD- will monitor closely for skin breakdown, etc.  15.  Onychogryphosis/mycosis- severe OP DPM f/u as they are not availabe for consults in hospital     LOS: 10 days A FACE TO FACE EVALUATION WAS PERFORMED  Charlett Blake 04/13/2019, 7:12 AM

## 2019-04-13 NOTE — Plan of Care (Signed)
  Problem: Consults Goal: RH STROKE PATIENT EDUCATION Description: See Patient Education module for education specifics  Outcome: Progressing   Problem: RH BOWEL ELIMINATION Goal: RH STG MANAGE BOWEL WITH ASSISTANCE Description: STG Manage Bowel with mod I Assistance. Outcome: Progressing   Problem: RH BLADDER ELIMINATION Goal: RH STG MANAGE BLADDER WITH ASSISTANCE Description: STG Manage Bladder With Mod I Assistance Outcome: Progressing   Problem: RH SKIN INTEGRITY Goal: RH STG MAINTAIN SKIN INTEGRITY WITH ASSISTANCE Description: STG Maintain Skin Integrity With Mod I Assistance. Outcome: Progressing   Problem: RH SAFETY Goal: RH STG ADHERE TO SAFETY PRECAUTIONS W/ASSISTANCE/DEVICE Description: STG Adhere to Safety Precautions With cues and reminders  Outcome: Progressing   Problem: RH PAIN MANAGEMENT Goal: RH STG PAIN MANAGED AT OR BELOW PT'S PAIN GOAL Description: Pain level less that 4 on scale of 0-10 Outcome: Progressing   Problem: RH KNOWLEDGE DEFICIT Goal: RH STG INCREASE KNOWLEDGE OF HYPERTENSION Description: Pt will be able to adhere to medication regimen, dietary and lifestyle modification to control blood glucose and hyperlipidemia with mod I assist upon discharge.  Outcome: Progressing Goal: RH STG INCREASE KNOWLEGDE OF HYPERLIPIDEMIA Description: Pt will be able to adhere to medication regimen, dietary and lifestyle modification to control blood glucose and hyperlipidemia with mod I assist upon discharge.  Outcome: Progressing Goal: RH STG INCREASE KNOWLEDGE OF STROKE PROPHYLAXIS Description: Pt will be able to adhere to medication regimen, dietary and lifestyle modification to control blood glucose and hyperlipidemia with mod I assist upon discharge.  Outcome: Progressing

## 2019-04-13 NOTE — Progress Notes (Signed)
Physical Therapy Session Note  Patient Details  Name: Jeremy Sherman MRN: [id number] Date of Birth: Sep 29, 1950  Today's Date: 04/13/2019 PT Individual Time: [id number] PT Individual Time Calculation (min): 73 min   Short Term Goals: Week 2:  PT Short Term Goal 1 (Week 2): Pt will consistently perform supine<>sit with min assist PT Short Term Goal 2 (Week 2): Pt will perform bed<>chair transfers using LRAD with CGA PT Short Term Goal 3 (Week 2): Pt will consistently ambulate at least 61ft using LRAD with min assist of 1 person PT Short Term Goal 4 (Week 2): Pt will ascend/descend 2 steps using handrails with mod assist  Skilled Therapeutic Interventions/Progress Updates:    Patient supine in bed upon PT arrival, agreeable to therapy tx, denies pain today. Pt was in the middle of eating lunch but agreed to participate in therapy if lunch could be warmed up at the end of the session. Therapist totalA to donn socks and socks, pt denied compression ace wrapping to BLE.  Supine>sitting EOB with CGA-minA for LE management. Sitting EOB>standing with with modA initially with cues for sequencing, technique, foot positioning, and upright posture; requires additional time to extend trunk in upright position. In standing, pt stated he needed to go to the bathroom. Second therapist received the commode and positioned it so that the pt could perform stand pivot transfer, pt required minA for RW management with turning and then transferred to sitting on commode. Pt left seated on commode with distance supervision for privacy. Sit>stand with CGA and cues with seqeencing, pt performed standing balance approx. 10 minutes with intermittent unilateral UE support to perform self pericare and allow therapists to donn brief and pants. Stand pivot transfer to bed > sitting EOB > stand pivot transfer to w/c with minA and transported to rehab gym for time management. Pt performed numerous sit<>stands throughout  session requiring CGA-minA. Pt performed stair negotiation ascending/descending 4 steps with B rails with CGA-minA, cues for sequencing, technique, upright posture. While pt rested seated in w/c, therapist contacted pt's wife to verify entry way into home to better apply functional stair training in future sessions. Pt's wife stated 2 step to porch and 1 step into the door with room for RW and no railings, however in the process of trying to get B rails installed. Therapist also discussed importance of compression socks to assist regulated pt's blood pressure as he sometimes get's lightheaded.  Pt ambulated 136' with Eva walker with minA for walker management, cues for step length and upright posture. Pt transported in w/c to room at end of session. Pt left seated in w/c with needs in reach, lunch warmed up, and chair alarm set.     Therapy Documentation Precautions:  Precautions Precautions: Fall Precaution Comments: monitor BP Restrictions Weight Bearing Restrictions: No    Therapy/Group: Individual Therapy  Juliann Pulse SPT 04/13/2019, 7:48 AM

## 2019-04-14 ENCOUNTER — Inpatient Hospital Stay (HOSPITAL_COMMUNITY): Payer: BC Managed Care – PPO

## 2019-04-14 ENCOUNTER — Inpatient Hospital Stay (HOSPITAL_COMMUNITY): Payer: BC Managed Care – PPO | Admitting: Speech Pathology

## 2019-04-14 ENCOUNTER — Inpatient Hospital Stay (HOSPITAL_COMMUNITY): Payer: BC Managed Care – PPO | Admitting: Occupational Therapy

## 2019-04-14 ENCOUNTER — Inpatient Hospital Stay (HOSPITAL_COMMUNITY): Payer: BC Managed Care – PPO | Admitting: Physical Therapy

## 2019-04-14 LAB — GLUCOSE, CAPILLARY
Glucose-Capillary: 96 mg/dL (ref 70–99)
Glucose-Capillary: 104 mg/dL — ABNORMAL HIGH (ref 70–99)

## 2019-04-14 NOTE — Progress Notes (Signed)
Physical Therapy Session Note  Patient Details  Name: Jeremy Sherman MRN: [id number] Date of Birth: 08-26-1950  Today's Date: 04/14/2019 PT Individual Time: [id number] PT Individual Time Calculation (min): 53 min   Short Term Goals: Week 2:  PT Short Term Goal 1 (Week 2): Pt will consistently perform supine<>sit with min assist PT Short Term Goal 2 (Week 2): Pt will perform bed<>chair transfers using LRAD with CGA PT Short Term Goal 3 (Week 2): Pt will consistently ambulate at least 52ft using LRAD with min assist of 1 person PT Short Term Goal 4 (Week 2): Pt will ascend/descend 2 steps using handrails with mod assist  Skilled Therapeutic Interventions/Progress Updates:   Pt in w/c and agreeable to therapy, denies pain at rest. Pt moans throughout session, denies pain when asked and states "that's just how I get through it". Pt also denied dizziness or symptoms of orthostasis. Total assist w/c transport to/from therapy gym for energy conservation. Worked on functional endurance and strengthening this session. Blocked practice of sit<>stands from w/c w/ verbal/tactile cues for technique, BUE placement, and posture once coming up into standing. Min assist fading to mod assist w/ fatigue. Performed x4 reps. Ambulated 15' and 30' w/ CGA-min assist using RW and very slow gait speed. Verbal and tactile cues for upright posture. Close w/c follow for safety but did not need to emergently sit. Stand pivot transfer w/ RW to NuStep, min assist. NuStep 4 min x2 @ level 3 w/ all extremities to work on endurance and strengthening. Pt unable to rate on RPE scale, moderate increase in work of breathing. Returned to room and performed stand pivot to EOB w/ min-mod assist. Supine>sit w/ mod assist. Ended session in supine, all needs in reach.   Therapy Documentation Precautions:  Precautions Precautions: Fall Precaution Comments: monitor BP Restrictions Weight Bearing Restrictions: No Pain: Pain  Assessment Pain Scale: 0-10 Pain Score: 0-No pain  Therapy/Group: Individual Therapy  Addis Tuohy Clent Demark 04/14/2019, 11:58 AM

## 2019-04-14 NOTE — Plan of Care (Signed)
  Problem: Consults Goal: RH STROKE PATIENT EDUCATION Description: See Patient Education module for education specifics  Outcome: Progressing   Problem: RH BOWEL ELIMINATION Goal: RH STG MANAGE BOWEL WITH ASSISTANCE Description: STG Manage Bowel with mod I Assistance. Outcome: Progressing   Problem: RH BLADDER ELIMINATION Goal: RH STG MANAGE BLADDER WITH ASSISTANCE Description: STG Manage Bladder With Mod I Assistance Outcome: Progressing   Problem: RH SKIN INTEGRITY Goal: RH STG MAINTAIN SKIN INTEGRITY WITH ASSISTANCE Description: STG Maintain Skin Integrity With Mod I Assistance. Outcome: Progressing   Problem: RH SAFETY Goal: RH STG ADHERE TO SAFETY PRECAUTIONS W/ASSISTANCE/DEVICE Description: STG Adhere to Safety Precautions With cues and reminders  Outcome: Progressing   Problem: RH PAIN MANAGEMENT Goal: RH STG PAIN MANAGED AT OR BELOW PT'S PAIN GOAL Description: Pain level less that 4 on scale of 0-10 Outcome: Progressing   Problem: RH KNOWLEDGE DEFICIT Goal: RH STG INCREASE KNOWLEDGE OF HYPERTENSION Description: Pt will be able to adhere to medication regimen, dietary and lifestyle modification to control blood glucose and hyperlipidemia with mod I assist upon discharge.  Outcome: Progressing Goal: RH STG INCREASE KNOWLEGDE OF HYPERLIPIDEMIA Description: Pt will be able to adhere to medication regimen, dietary and lifestyle modification to control blood glucose and hyperlipidemia with mod I assist upon discharge.  Outcome: Progressing Goal: RH STG INCREASE KNOWLEDGE OF STROKE PROPHYLAXIS Description: Pt will be able to adhere to medication regimen, dietary and lifestyle modification to control blood glucose and hyperlipidemia with mod I assist upon discharge.  Outcome: Progressing

## 2019-04-14 NOTE — Progress Notes (Signed)
Physical Therapy Session Note  Patient Details  Name: Jeremy Sherman MRN: [id number] Date of Birth: 07-04-1950  Today's Date: 04/14/2019 PT Individual Time: 0826-0910 PT Individual Time Calculation (min): 44 min   Short Term Goals: Week 2:  PT Short Term Goal 1 (Week 2): Pt will consistently perform supine<>sit with min assist PT Short Term Goal 2 (Week 2): Pt will perform bed<>chair transfers using LRAD with CGA PT Short Term Goal 3 (Week 2): Pt will consistently ambulate at least 77ft using LRAD with min assist of 1 person PT Short Term Goal 4 (Week 2): Pt will ascend/descend 2 steps using handrails with mod assist  Skilled Therapeutic Interventions/Progress Updates:    Session focused on functional mobility re-training, overall endurance and activity tolerance, functional dynamic standing balance, and sit <> stands.  Pt requires extra time but able to come to EOB with CGA for initiation. Pt declines donning of ACE wraps and denies any symptoms of orthostasis during session. Limited standing tolerance due to fatigue per report rather than pain or dizziness. Requires several attempts and elevated bed to complete initial sit -> stand with RW with verbal and tactile cues for hand placement and facilitation of anterior weightshift with overall mod assist. Min to CGA for actual transition to w/c with cues for controlling descent using UE for support. Engaged in sit <> stands and dynamic standing balance retraining while performing cognitive task to play Connect 4 game with large pieces including having to reach down to pick up objects. Cues for upright posture and hip and trunk extension. Rest breaks as needed due to reported fatigue. Then pt reports having an incontinent BM. Returned to room via w/c total assist. Pt denies need for further BM. Performed sit <> stands at sink with min to mod assist with facilitation of anterior weightshift and cues for hand placement and technique. Requires  max assist for hygiene and to don brief with pt tendency to maintain very flexed posture. Requires assist with clothing management.   Therapy Documentation Precautions:  Precautions Precautions: Fall Precaution Comments: monitor BP Restrictions Weight Bearing Restrictions: No  Pain: Pain Assessment Pain Scale: 0-10 Pain Score: 0-No pain    Therapy/Group: Individual Therapy  Canary Brim Ivory Broad, PT, DPT, CBIS  04/14/2019, 9:14 AM

## 2019-04-14 NOTE — Progress Notes (Signed)
Attempted orthostatic VS. Obtained Lying and sitting positions. Unable to obtain standing due to safety concerns with transfer.

## 2019-04-14 NOTE — Progress Notes (Signed)
Occupational Therapy Session Note  Patient Details  Name: Jeremy Sherman MRN: [id number] Date of Birth: [birth year]  Today's Date: 04/14/2019 OT Individual Time: [id number] OT Individual Time Calculation (min): 76 min    Short Term Goals: Week 2:  OT Short Term Goal 1 (Week 2): Pt will complete LB bathing sit to stand with AE and min instructional cueing. OT Short Term Goal 2 (Week 2): Pt will complete LB dressing sit to stand with mod assist and AE PRN. OT Short Term Goal 3 (Week 2): Pt will complete toilet transfer with min assist using the RW for support. OT Short Term Goal 4 (Week 2): Pt will complete walk-in shower transfer with min assist and use of the RW for support.  Skilled Therapeutic Interventions/Progress Updates:    Pt completed shower and dressing during session.  He needed mod assist for sit to stand transitions in the shower, from the wheelchair, and from the bed.  Mod assist was also needed for functional mobility with the RW from the bed to the shower bench.  He was able to complete bathing with min instructional cueing and overall mod assist.  He continues to demonstrate some slight bowel incontinence as well, noted during shower and with dressing at the sink.   Mod assist was needed for donning brief and pants with use of the reacher to assist with donning them over his feet.  He was able to donn a pullover shirt with setup.  Total assist was needed for donning his gripper socks.  Pt continues to moan throughout sessions reporting all over general body pain, however it is much more prevalent in his feet when therapist was assisting with washing them.  Finished session with transfer back to supine with mod assist and pt with call button and phone in reach with safety belt in place.    Therapy Documentation Precautions:  Precautions Precautions: Fall Precaution Comments: monitor BP Restrictions Weight Bearing Restrictions: No   Pain: Pain Assessment Pain  Scale: Faces Faces Pain Scale: Hurts a little bit Pain Type: Acute pain Pain Location: Foot Pain Orientation: Right;Left Pain Descriptors / Indicators: Grimacing;Moaning Pain Onset: With Activity Pain Intervention(s): Repositioned;Emotional support ADL: See Care Tool Section for some details of mobility and selfcare tasks  Therapy/Group: Individual Therapy  Yer Olivencia OTR/L 04/14/2019, 4:30 PM

## 2019-04-14 NOTE — Progress Notes (Signed)
Jeremy Sherman PHYSICAL MEDICINE & REHABILITATION PROGRESS NOTE   Subjective/Complaints: Jeremy Sherman has no complaints this morning.  Denies pain, constipation. Still with difficulty voiding, being cathed.  Not too much appetite, has not consumed much of breakfast.   ROS- no swallowing issues, no CP, no SOB, no N/V/D  Objective:   No results found. No results for input(s): WBC, HGB, HCT, PLT in the last 72 hours. No results for input(s): NA, K, CL, CO2, GLUCOSE, BUN, CREATININE, CALCIUM in the last 72 hours.  Intake/Output Summary (Last 24 hours) at 04/14/2019 0951 Last data filed at 04/14/2019 0800 Gross per 24 hour  Intake 150 ml  Output 1950 ml  Net -1800 ml     Physical Exam: Vital Signs Blood pressure (!) 142/85, pulse 72, temperature 98.4 F (36.9 C), temperature source Oral, resp. rate 18, height 6\' 5"  (1.956 m), weight 106.9 kg, SpO2 100 %.   General: No acute distress; sitting up in bed, breakfast tray is beside him, mostly uneaten.  Mood and affect are appropriate Heart: Regular rate and rhythm no rubs murmurs or extra sounds Lungs: Clear to auscultation, breathing unlabored, no rales or wheezes Abdomen: Positive bowel sounds, soft nontender to palpation, nondistended Extremities: No clubbing, cyanosis, or edema Skin: No evidence of breakdown, no evidence of rash Neurologic:, motor strength is 4/5 in right and 5/5 left deltoid, bicep, tricep, grip, hip flexor, knee extensors, ankle dorsiflexor and plantar flexor Sensory exam normal sensation to light touch and proprioception in bilateral upper and lower extremities Cerebellar exam normal finger to nose to finger as well as heel to shin in bilateral upper and lower extremities Musculoskeletal: Full range of motion in all 4 extremities. No joint swelling    Assessment/Plan: 1. Functional deficits secondary to L ACA aneurysm, MCA  Stenosis causing Left hemispheric ischemia which require 3+ hours per day of  interdisciplinary therapy in a comprehensive inpatient rehab setting.  Physiatrist is providing close team supervision and 24 hour management of active medical problems listed below.  Physiatrist and rehab team continue to assess barriers to discharge/monitor patient progress toward functional and medical goals  Care Tool:  Bathing    Body parts bathed by patient: Right arm, Left arm, Chest, Abdomen, Right upper leg, Left upper leg, Face, Front perineal area   Body parts bathed by helper: Buttocks Body parts n/a: Left lower leg, Right lower leg   Bathing assist Assist Level: Moderate Assistance - Patient 50 - 74%     Upper Body Dressing/Undressing Upper body dressing   What is the patient wearing?: Pull over shirt    Upper body assist Assist Level: Supervision/Verbal cueing    Lower Body Dressing/Undressing Lower body dressing      What is the patient wearing?: Pants, Incontinence brief     Lower body assist Assist for lower body dressing: Maximal Assistance - Patient 25 - 49%     Toileting Toileting    Toileting assist Assist for toileting: Maximal Assistance - Patient 25 - 49%     Transfers Chair/bed transfer  Transfers assist     Chair/bed transfer assist level: Minimal Assistance - Patient > 75%     Locomotion Ambulation   Ambulation assist   Ambulation activity did not occur: Safety/medical concerns  Assist level: Minimal Assistance - Patient > 75% Assistive device: Ethelene Hal Max distance: 136'   Walk 10 feet activity   Assist  Walk 10 feet activity did not occur: Safety/medical concerns  Assist level: Minimal Assistance - Patient > 75%  Assistive device: Walker-Eva   Walk 50 feet activity   Assist Walk 50 feet with 2 turns activity did not occur: Safety/medical concerns  Assist level: Minimal Assistance - Patient > 75% Assistive device: Walker-Eva    Walk 150 feet activity   Assist Walk 150 feet activity did not occur:  Safety/medical concerns         Walk 10 feet on uneven surface  activity   Assist Walk 10 feet on uneven surfaces activity did not occur: Safety/medical concerns         Wheelchair     Assist Will patient use wheelchair at discharge?: (TBD)             Wheelchair 50 feet with 2 turns activity    Assist            Wheelchair 150 feet activity     Assist          Blood pressure (!) 142/85, pulse 72, temperature 98.4 F (36.9 C), temperature source Oral, resp. rate 18, height 6\' 5"  (1.956 m), weight 106.9 kg, SpO2 100 %.    Medical Problem List and Plan:  1. 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 Antithrombotics:  -DVT/anticoagulation: Eliquis  -antiplatelet therapy: Brilinta  3. Pain Management: Tramadol as needed  -pain is well controlled 4. Mood: Provide emotional support  -antipsychotic agents: N/A  5. Neuropsych: This patient is capable of making decisions on his own behalf.  6. Skin/Wound Care: Routine skin checks  7. Fluids/Electrolytes/Nutrition: Routine in and outs with follow-up chemistries  8. Hypertension. Patient on no home med antihypertensive medications. Monitor with increased mobility  Vitals:   04/13/19 2012 04/14/19 0509  BP: 121/62 (!) 142/85  Pulse: 76 72  Resp: 19 18  Temp: 99.5 F (37.5 C) 98.4 F (36.9 C)  SpO2: 100% 100%  Orthostatic hypotension will stop flomax and Urecholine and check daily ortho BPs, may ACE wrap legs  -BP slightly elevated this morning. Reviewed PT note. Was stable today in therapy.  9. Hyperlipidemia. Lipitor  10. Tobacco abuse. Counseling  11.  Prediabetes. Hemoglobin A1c 6.4. SSI. Patient on no diabetic agents prior to admission. Well controlled no meds CBG (last 3)  Recent Labs    04/12/19 1817 04/13/19 0617 04/14/19 0617  GLUCAP 103* 95 96   Controlled 2/14 continue CBG BID  2/15: stable 12. BPH /UTI. Flomax 0.4 mg daily. Urinary retention with painful caths accompanied by hematuria , foley replaced, f/u with urology 13. Hypothyroidism. TSH 45! Synthroid started. Patient will need follow-up thyroid panel in 3 months  2/7- likely reason sleeping so much.   24. Per chart, severe PAD with R foot ischemia- has LE foot/ankle pain on exam that is likely c/w PAD- will monitor closely for skin breakdown, etc.  15.  Onychogryphosis/mycosis- severe OP DPM f/u as they are not availabe for consults in hospital     LOS: 11 days A FACE TO FACE EVALUATION WAS Jeremy Sherman 04/14/2019, 9:51 AM

## 2019-04-14 NOTE — Progress Notes (Addendum)
Speech Language Pathology Daily Session Note  Patient Details  Name: Jeremy Sherman MRN: [id number] Date of Birth: 1950/05/06  Today's Date: 04/14/2019 SLP Individual Time: [id number] SLP Individual Time Calculation (min): 30 min  Short Term Goals: Week 2: SLP Short Term Goal 1 (Week 2): Pt will consume trials of Dys 3 solids supervision cues for effective mastication and oral clearance X3 prior to upgrade. SLP Short Term Goal 2 (Week 2): Pt will demonstrate ability to problem solve during functional familiar tasks with Min A verbal/visual cues. SLP Short Term Goal 3 (Week 2): Pt will selectively attend to tasks for 10 minutes with Min A verbal/verbal cues for redirection. SLP Short Term Goal 4 (Week 2): Pt will recall new and daily information with Mod A verbal/visual cues for use of compensatory strategies. SLP Short Term Goal 5 (Week 2): Pt will demonstrate intellectual awareness by stating 1 physical and 1 cognitive deficit with Mod A verbal/visual cues.  Skilled Therapeutic Interventions: Pt was seen for skilled ST targeting dysphagia and cognitive goals. SLP facilitated session with a double portion size snack of upgraded Dys 3 (mech soft) solids with thin liquids to assess readiness for diet advancement. Pt exhibited mildly prolonged but effective mastication and fully efficient oral clearance of solids throughout intake. He required 2 verbal cues for use of liquid wash across intake to assist with oral clearance. Given pt's consistent improvements in mastication and oral clearance of Dys 3 trials, recommend full upgrade to Dys 3 textures, continue thin liquids and meds may be administered whole with water (please break in half if very large).  Pt with improved ability to use external aids to orient to place and date - Min A verbal and visual cues required for pt's successful use of calendar for orientation. He did not recall rules of a previously targeted card task, however able to  identify 2 cards to equal 11 with overall Min A verbal cues for accuracy in calculations, but increased Mod A verbal cues for problem solving use of a strategy for organization and efficiency within task. Also of note, pt selectively attended to tasks in an environment with mildly distracting background noise with only Supervision A verbal cues for redirection. Pt left sitting in wheelchair with alarm set and needs within reach. Continue per current plan of care.       Pain Pain Assessment Pain Scale: 0-10 Pain Score: 0-No pain  Therapy/Group: Individual Therapy  Arbutus Leas 04/14/2019, 10:55 AM

## 2019-04-15 ENCOUNTER — Inpatient Hospital Stay (HOSPITAL_COMMUNITY): Payer: BC Managed Care – PPO

## 2019-04-15 ENCOUNTER — Inpatient Hospital Stay (HOSPITAL_COMMUNITY): Payer: BC Managed Care – PPO | Admitting: Occupational Therapy

## 2019-04-15 ENCOUNTER — Inpatient Hospital Stay (HOSPITAL_COMMUNITY): Payer: BC Managed Care – PPO | Admitting: Speech Pathology

## 2019-04-15 LAB — GLUCOSE, CAPILLARY
Glucose-Capillary: 98 mg/dL (ref 70–99)
Glucose-Capillary: 75 mg/dL (ref 70–99)
Glucose-Capillary: 126 mg/dL — ABNORMAL HIGH (ref 70–99)

## 2019-04-15 NOTE — Progress Notes (Signed)
Speech Language Pathology Daily Session Note  Patient Details  Name: Jeremy Sherman MRN: [id number] Date of Birth: [birth year]  Today's Date: 04/15/2019 SLP Individual Time: 0900-0958 SLP Individual Time Calculation (min): 58 min  Short Term Goals: Week 2: SLP Short Term Goal 1 (Week 2): Pt will consume trials of Dys 3 solids supervision cues for effective mastication and oral clearance X3 prior to upgrade. SLP Short Term Goal 2 (Week 2): Pt will demonstrate ability to problem solve during functional familiar tasks with Min A verbal/visual cues. SLP Short Term Goal 3 (Week 2): Pt will selectively attend to tasks for 10 minutes with Min A verbal/verbal cues for redirection. SLP Short Term Goal 4 (Week 2): Pt will recall new and daily information with Mod A verbal/visual cues for use of compensatory strategies. SLP Short Term Goal 5 (Week 2): Pt will demonstrate intellectual awareness by stating 1 physical and 1 cognitive deficit with Mod A verbal/visual cues.  Skilled Therapeutic Interventions: Pt was seen for skilled ST targeting cognitive goals. Pt now independently oriented to place and self, however Min A verbal and visual cues required for use of calendar to orient to month/date/year. During a basic 3-step action card sequencing task, pt required consistent Mod A verbal and visual cueing to detect errors, however could problem solve their corrections with decreased Min A verbal cues. In functional conversation regarding medications, pt verbally recalled 1 of 2 familiar home medication names/functions. Max A verbal cues required for initial recall of current medications/those that are new to this admission. Pt would recall ~75% new medication functions with 5 min delay. Pt left laying in bed with alarm set and needs within reach. Continue per current plan of care.       Pain Pain Assessment Pain Scale: 0-10 Pain Score: 0-No pain  Therapy/Group: Individual Therapy  Arbutus Leas 04/15/2019, 9:51 AM

## 2019-04-15 NOTE — Progress Notes (Signed)
Physical Therapy Session Note  Patient Details  Name: Jeremy Sherman MRN: [id number] Date of Birth: 06/05/1950  Today's Date: 04/15/2019 PT Individual Time: [id number] PT Individual Time Calculation (min): 70 min   Short Term Goals: Week 2:  PT Short Term Goal 1 (Week 2): Pt will consistently perform supine<>sit with min assist PT Short Term Goal 2 (Week 2): Pt will perform bed<>chair transfers using LRAD with CGA PT Short Term Goal 3 (Week 2): Pt will consistently ambulate at least 76ft using LRAD with min assist of 1 person PT Short Term Goal 4 (Week 2): Pt will ascend/descend 2 steps using handrails with mod assist  Skilled Therapeutic Interventions/Progress Updates:   Session focused on functional mobility re-training, balance, standing tolerance, strengthening and overall endurance. Pt requires CGA to come to EOB with extra time and assist to initiate LLE. Able to scoot and reposition EOB with supervision and extra time with groaning. Attempted to don shoes but pt unable to tolerate due to sensitivity and request not to wear them today. Multiple attempts with bed elevated for sit <> stand ultimiately requiring max assist (min assist once in standing). Noted to have soiled brief (near top so not truly a BM), so engaged in changing of brief in standing with need for seated rest break prior to pulling up pants. Mod assist for next sit to stand with RW and min assist for short distance ambulatory transfer to w/c x 5'. Self propelled w/c to therapy gym x 150' with supervision with cues for technique during turns and obstacle negotiation. Mod assist to stand with RW and min to transfer to therapy mat x 10' of gait with cues for upright posture. Required max assist for sit -> stand from mat table due to not having armrests to push up from. Began dynamic standing balance and upright posture activity but pt reporting urgency for BM. Returned to room via w/c total assist for time and performed  mod assist transfer to elevated BSC over toilet with cues for technique. Pt already with incontinent BM in brief and then performed another continent BM into the toilet. Mod assist in standing with attempt for patient to perform hygiene with limited standing tolerance and ultimately patient needed assist to complete including brief and clothing management with multiple seated rest breaks due to fatigue and patient closing eyes. Pt denies any symptoms of orthostasis and increased pain, just complaining of increased fatigue. Declined any further standing activities. Attempted to perform seated therex (did 10 reps BLE of LAQ) but then reports too much fatigue. Pt request to stay up in w/c until linens changed. RN made aware.   Therapy Documentation Precautions:  Precautions Precautions: Fall Precaution Comments: monitor BP Restrictions Weight Bearing Restrictions: No  Vital Signs: Therapy Vitals Pulse Rate: 81 BP: 94/60 Patient Position (if appropriate): Sitting   Pain: Premedicated per report for pain in BLE. Difficulty with accurate description or rating.   Therapy/Group: Individual Therapy  Canary Brim Ivory Broad, PT, DPT, CBIS  04/15/2019, 12:01 PM

## 2019-04-15 NOTE — Progress Notes (Signed)
Occupational Therapy Session Note  Patient Details  Name: Gen Wolfinger MRN: [id number] Date of Birth: 1951/01/21  Today's Date: 04/15/2019 OT Individual Time: [id number] OT Individual Time Calculation (min): 77 min    Short Term Goals: Week 2:  OT Short Term Goal 1 (Week 2): Pt will complete LB bathing sit to stand with AE and min instructional cueing. OT Short Term Goal 2 (Week 2): Pt will complete LB dressing sit to stand with mod assist and AE PRN. OT Short Term Goal 3 (Week 2): Pt will complete toilet transfer with min assist using the RW for support. OT Short Term Goal 4 (Week 2): Pt will complete walk-in shower transfer with min assist and use of the RW for support.  Skilled Therapeutic Interventions/Progress Updates:    Pt in the wheelchair to start session.  BP taken in sitting at 113/66.  Attempted to get BP in standing initially and pt was unable to maintain standing long enough secondary to feeling light headed.  Therapist ace wrapped his LEs up to his knees and then had him stand with BP taken at 113/61.  Took pt down to the tub/shower room for practice with tub/shower transfers.  He was able to use the RW and the tub bench to complete transfer with mod assist.  Next, had pt work on sit to stand and standing balance while tossing horseshoes.  He was able to complete several sit to stand transitions with min assist overall but needed mod instructional cueing to bring his feet further back before trying to stand.  He was able to maintain standing balance with min assist using the RW for support and without in static standing.  Place horseshoes on a rolling stool to incorporate functional reaching as well using the RUE mostly.  Finished session with transition back to the room and transfer back to the bed for pt to rest.  LE ace wraps were removed and call button and phone were placed in reach.  Bed alarm in place.      Therapy Documentation Precautions:   Precautions Precautions: Fall Precaution Comments: monitor BP Restrictions Weight Bearing Restrictions: No   Vital Signs: Therapy Vitals Temp: 98.5 F (36.9 C)(rechecked) Temp Source: Oral Pulse Rate: 67 Resp: 18 BP: (!) 134/57 Patient Position (if appropriate): Lying Oxygen Therapy SpO2: 100 % O2 Device: Room Air Pain: Pain Assessment Pain Scale: Faces Faces Pain Scale: Hurts a little bit Pain Type: Acute pain Pain Location: Foot Pain Orientation: Right;Left Pain Onset: With Activity Pain Intervention(s): Repositioned ADL: See Care Tool Section for some details of mobility and selfcare tasks.   Therapy/Group: Individual Therapy  Alynna Hargrove OTR/L 04/15/2019, 3:57 PM

## 2019-04-15 NOTE — Progress Notes (Signed)
[facility] PHYSICAL MEDICINE & REHABILITATION PROGRESS NOTE   Subjective/Complaints: Ate ~1/3 breakfast  No c/os today remebered seeing me yesterday or day before   ROS- no swallowing issues, no CP, no SOB, no N/V/D  Objective:   No results found. No results for input(s): WBC, HGB, HCT, PLT in the last 72 hours. No results for input(s): NA, K, CL, CO2, GLUCOSE, BUN, CREATININE, CALCIUM in the last 72 hours.  Intake/Output Summary (Last 24 hours) at 04/15/2019 [id number] Last data filed at 04/15/2019 0545 Gross per 24 hour  Intake 300 ml  Output 900 ml  Net -600 ml     Physical Exam: Vital Signs Blood pressure 107/71, pulse 74, temperature 98.9 F (37.2 C), resp. rate 18, height 6\' 5"  (1.956 m), weight 106.9 kg, SpO2 98 %.   General: No acute distress; sitting up in bed, breakfast tray is beside him, mostly uneaten.  Mood and affect are appropriate Heart: Regular rate and rhythm no rubs murmurs or extra sounds Lungs: Clear to auscultation, breathing unlabored, no rales or wheezes Abdomen: Positive bowel sounds, soft nontender to palpation, nondistended Extremities: No clubbing, cyanosis, or edema Skin: No evidence of breakdown, no evidence of rash Neurologic:, motor strength is 4/5 in right and 5/5 left deltoid, bicep, tricep, grip, hip flexor, knee extensors, ankle dorsiflexor and plantar flexor  Cerebellar exam normal finger to nose to finger as well as heel to shin in bilateral upper and lower extremities Musculoskeletal: Full range of motion in all 4 extremities. No joint swelling, toe nail pain to palpation     Assessment/Plan: 1. Functional deficits secondary to L ACA aneurysm, MCA  Stenosis causing Left hemispheric ischemia which require 3+ hours per day of interdisciplinary therapy in a comprehensive inpatient rehab setting.  Physiatrist is providing close team supervision and 24 hour management of active medical problems listed below.  Physiatrist and rehab team  continue to assess barriers to discharge/monitor patient progress toward functional and medical goals  Care Tool:  Bathing    Body parts bathed by patient: Right arm, Left arm, Chest, Abdomen, Right upper leg, Left upper leg, Face, Front perineal area   Body parts bathed by helper: Right lower leg, Left lower leg, Buttocks Body parts n/a: Left lower leg, Right lower leg   Bathing assist Assist Level: Moderate Assistance - Patient 50 - 74%     Upper Body Dressing/Undressing Upper body dressing   What is the patient wearing?: Pull over shirt    Upper body assist Assist Level: Supervision/Verbal cueing    Lower Body Dressing/Undressing Lower body dressing      What is the patient wearing?: Pants, Incontinence brief     Lower body assist Assist for lower body dressing: Moderate Assistance - Patient 50 - 74%     Toileting Toileting    Toileting assist Assist for toileting: Maximal Assistance - Patient 25 - 49%     Transfers Chair/bed transfer  Transfers assist     Chair/bed transfer assist level: Moderate Assistance - Patient 50 - 74%     Locomotion Ambulation   Ambulation assist   Ambulation activity did not occur: Safety/medical concerns  Assist level: Minimal Assistance - Patient > 75% Assistive device: Walker-rolling Max distance: 30'   Walk 10 feet activity   Assist  Walk 10 feet activity did not occur: Safety/medical concerns  Assist level: Minimal Assistance - Patient > 75% Assistive device: Walker-rolling   Walk 50 feet activity   Assist Walk 50 feet with 2 turns activity  did not occur: Safety/medical concerns  Assist level: Minimal Assistance - Patient > 75% Assistive device: Walker-Eva    Walk 150 feet activity   Assist Walk 150 feet activity did not occur: Safety/medical concerns         Walk 10 feet on uneven surface  activity   Assist Walk 10 feet on uneven surfaces activity did not occur: Safety/medical concerns          Wheelchair     Assist Will patient use wheelchair at discharge?: (TBD)             Wheelchair 50 feet with 2 turns activity    Assist            Wheelchair 150 feet activity     Assist          Blood pressure 107/71, pulse 74, temperature 98.9 F (37.2 C), resp. rate 18, height 6\' 5"  (1.956 m), weight 106.9 kg, SpO2 98 %.    Medical Problem List and Plan:  1. 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 Antithrombotics:  -DVT/anticoagulation: Eliquis  -antiplatelet therapy: Brilinta  3. Pain Management: Tramadol as needed  -pain is well controlled 4. Mood: Provide emotional support  -antipsychotic agents: N/A  5. Neuropsych: This patient is capable of making decisions on his own behalf.  6. Skin/Wound Care: Routine skin checks  7. Fluids/Electrolytes/Nutrition: Routine in and outs with follow-up chemistries  8. Hypertension. Patient on no home med antihypertensive medications. Monitor with increased mobility  Vitals:   04/15/19 0540 04/15/19 0543  BP: 115/64 107/71  Pulse: 70 74  Resp: 18   Temp: 98.9 F (37.2 C)   SpO2: 98% 98%  Orthostatic hypotension will stop flomax and Urecholine and check daily ortho BPs, may ACE wrap legs  -BP slightly elevated this morning. Reviewed PT note. Was stable today in therapy.  9. Hyperlipidemia. Lipitor  10. Tobacco abuse. Counseling  11. Prediabetes. Hemoglobin A1c 6.4. SSI. Patient on no diabetic agents prior to admission. Well controlled no meds CBG (last 3)  Recent Labs    04/14/19 0617 04/14/19 1807 04/15/19 0615  GLUCAP 96 104* 98   Controlled    2/16:  12. BPH /UTI. Flomax 0.4 mg daily. Urinary retention with painful caths accompanied by hematuria , foley replaced, f/u with urology 13. Hypothyroidism. TSH 45! Synthroid started. Patient will need follow-up thyroid panel in 3 months  2/7- likely reason sleeping so much.   59. Per chart, severe PAD with R foot ischemia- has LE foot/ankle pain on exam that is likely c/w PAD- will monitor closely for skin breakdown, etc.  15.  Onychogryphosis/mycosis- severe OP DPM f/u as they are not availabe for consults in hospital     LOS: 12 days A FACE TO FACE EVALUATION WAS PERFORMED  Charlett Blake 04/15/2019, 8:22 AM

## 2019-04-16 ENCOUNTER — Inpatient Hospital Stay (HOSPITAL_COMMUNITY): Payer: BC Managed Care – PPO | Admitting: Occupational Therapy

## 2019-04-16 ENCOUNTER — Inpatient Hospital Stay (HOSPITAL_COMMUNITY): Payer: BC Managed Care – PPO | Admitting: Speech Pathology

## 2019-04-16 ENCOUNTER — Inpatient Hospital Stay (HOSPITAL_COMMUNITY): Payer: BC Managed Care – PPO | Admitting: Physical Therapy

## 2019-04-16 LAB — GLUCOSE, CAPILLARY: Glucose-Capillary: 95 mg/dL (ref 70–99)

## 2019-04-16 NOTE — Progress Notes (Signed)
Team Conference Report to [employer] discussion was reviewed with the patient and caregiver/wife, including goals for supervision-min assist, any changes in plan of care and target discharge date.  Patient and caregiver express understanding and are in agreement.  The patient has a target discharge date of 04/24/19. Family education set up for 04/22/19 @ 1300. Wife noted their son is working on railings for the deck. Patient and wife state an understanding of the need for follow up with a urologist and podiatrist after discharge. HH follow up has been arranged with [facility] and DME recommendations are pending.  Dorien Chihuahua B 04/16/2019, 3:07 PM

## 2019-04-16 NOTE — Progress Notes (Signed)
Physical Therapy Session Note  Patient Details  Name: Jeremy Sherman MRN: [id number] Date of Birth: 1950-12-28  Today's Date: 04/16/2019 PT Individual Time: 0930-1030 PT Individual Time Calculation (min): 60 min    Short Term Goals: Week 2:  PT Short Term Goal 1 (Week 2): Pt will consistently perform supine<>sit with min assist PT Short Term Goal 2 (Week 2): Pt will perform bed<>chair transfers using LRAD with CGA PT Short Term Goal 3 (Week 2): Pt will consistently ambulate at least 47ft using LRAD with min assist of 1 person PT Short Term Goal 4 (Week 2): Pt will ascend/descend 2 steps using handrails with mod assist  Skilled Therapeutic Interventions/Progress Updates:    Patient seated in w/c upon PT arrival, agreeable to thearpy tx, denies pain. Pt transported to rehab gym in w/c for energy conservation and time management. All sit<>stand transfers throughout session were CGA-minA with cues for foot placement, anterior trunk lean, upright posture and sequencing. Sit > standing in front of mat table > tall kneeling on mat table with minA to bring hips forward, consistent cueing for upright posture. In tall kneeling with bend in front of pt performed the following exercises with intermittent rest breaks x1 seated in w/c and x1 on elbows and CGA-minA: mini-squats with 3-5s hold in low position x 5 reps, reaching to various heights for horseshoes held by another therapist with each UE x 4 reps each to facilitate upright posture w/intermittent unilateral UE support, bilateral shoulder flexion with 3# bar x 5 reps + x 2 reps stopping d/t fatigue. Pt transferred off of mat table > standing > sitting in w/c with minA for safety. Pt ascended/descended x 4 steps with B hand rails and CGA with additional time. Pt ambulated 140' + 120' + 100' with Harmon Pier walker, intermittent seated rest breaks, and minA from therapist to maneuver walker, cues for upright posture, pt demonstrated improved ability to  maintain posture over time requiring infrequent cues and good step length that dec when becoming fatigued. Final 100' of ambulation brought pt back to room, stand>sit in w/c with minA. Pt left in w/c with needs in reach and chair alarm set.   Therapy Documentation Precautions:  Precautions Precautions: Fall Precaution Comments: monitor BP Restrictions Weight Bearing Restrictions: No    Therapy/Group: Individual Therapy  Juliann Pulse SPT 04/16/2019, 7:36 AM

## 2019-04-16 NOTE — Progress Notes (Signed)
Occupational Therapy Session Note  Patient Details  Name: Jeremy Sherman MRN: [id number] Date of Birth: 10-25-1950  Today's Date: 04/16/2019 OT Individual Time: 0830-0900 OT Individual Time Calculation (min): 30 min    Short Term Goals: Week 2:  OT Short Term Goal 1 (Week 2): Pt will complete LB bathing sit to stand with AE and min instructional cueing. OT Short Term Goal 2 (Week 2): Pt will complete LB dressing sit to stand with mod assist and AE PRN. OT Short Term Goal 3 (Week 2): Pt will complete toilet transfer with min assist using the RW for support. OT Short Term Goal 4 (Week 2): Pt will complete walk-in shower transfer with min assist and use of the RW for support.  Skilled Therapeutic Interventions/Progress Updates:  Patient met in supine near completion of breakfast meal. Patient completed supine to EOB transfer with supervision assist. OT wrapped BLE with ace bandage with patient c/o pain in LLE > RLE. Patient able to doff bilateral socks with use of reacher and Mod A with 1-3 verbal cues for sequencing. Seated EOB, patient able to thread BLE through LB clothing with Mod A after threading of foley catheter, however patient required total assist to don socks secondary to pain. Patient completed sit to stand with Mod to RW from EOB with verbal cues for hand placement. Patient able to hike pants over hips in standing with Mod A and verbal cues for hand placement on RW. Patient able to maintain standing balance without UE support on RW. Patient completed stand-pivot transfer to [facility] with use of RW and Min A. Patient left seated in [facility] with all needs met, call bell within reach, and seat belt activated.   Therapy Documentation Precautions:  Precautions Precautions: Fall Precaution Comments: monitor BP Restrictions Weight Bearing Restrictions: No Pain: Patient c/o pain in LLE > RLE with LB dressing.   Therapy/Group: Individual Therapy  Destanae R Howerton-Davis 04/16/2019,  10:19 AM

## 2019-04-16 NOTE — Progress Notes (Signed)
Occupational Therapy Session Note  Patient Details  Name: Jeremy Sherman MRN: [id number] Date of Birth: 06/21/1950  Today's Date: 04/16/2019 OT Individual Time: [id number] OT Individual Time Calculation (min): 29 min    Short Term Goals: Week 2:  OT Short Term Goal 1 (Week 2): Pt will complete LB bathing sit to stand with AE and min instructional cueing. OT Short Term Goal 2 (Week 2): Pt will complete LB dressing sit to stand with mod assist and AE PRN. OT Short Term Goal 3 (Week 2): Pt will complete toilet transfer with min assist using the RW for support. OT Short Term Goal 4 (Week 2): Pt will complete walk-in shower transfer with min assist and use of the RW for support.  Skilled Therapeutic Interventions/Progress Updates:    Pt completed BUE strengthening and endurance with use of the UE ergonometer during session.  He was able to complete 3 sets of 3 mins with resistance set on level 7 and  Manual Program.  He needed 1-2 minute rest breaks between each set.  Increased fatigue noted with pt voicing that he needed to rest right at the three minute mark.  Returned to the room at the end of the session with transfer back to the bed with min assist using the RW.  Mod instructional cueing for hand placement with sit to stand from the wheelchair as pt wants to pull up on the walker instead of pushing up from the wheelchair arms with both hands.  Pt left with call button and phone in reach with safety alarm in place.    Therapy Documentation Precautions:  Precautions Precautions: Fall Precaution Comments: monitor BP Restrictions Weight Bearing Restrictions: No General:   Vital Signs: Therapy Vitals Temp: 99.1 F (37.3 C) Pulse Rate: 74 Resp: 18 BP: 124/67 Patient Position (if appropriate): Lying Oxygen Therapy SpO2: 100 % O2 Device: Room Air Pain: Pain Assessment Pain Scale: Faces Pain Score: 0-No pain Pain Type: Acute pain Pain Location: Foot Pain Orientation:  Right;Left Pain Descriptors / Indicators: Grimacing Pain Onset: With Activity Pain Intervention(s): Repositioned;Emotional support ADL: See Care Tool Section for some details of mobility and selfcare  Therapy/Group: Individual Therapy  Venida Tsukamoto OTR/L 04/16/2019, 4:35 PM

## 2019-04-16 NOTE — Progress Notes (Signed)
Speech Language Pathology Daily Session Note  Patient Details  Name: Jeremy Sherman MRN: [id number] Date of Birth: 1950/09/16  Today's Date: 04/16/2019 SLP Individual Time: 1300-1355 SLP Individual Time Calculation (min): 55 min  Short Term Goals: Week 2: SLP Short Term Goal 1 (Week 2): Pt will consume trials of Dys 3 solids supervision cues for effective mastication and oral clearance X3 prior to upgrade. SLP Short Term Goal 2 (Week 2): Pt will demonstrate ability to problem solve during functional familiar tasks with Min A verbal/visual cues. SLP Short Term Goal 3 (Week 2): Pt will selectively attend to tasks for 10 minutes with Min A verbal/verbal cues for redirection. SLP Short Term Goal 4 (Week 2): Pt will recall new and daily information with Mod A verbal/visual cues for use of compensatory strategies. SLP Short Term Goal 5 (Week 2): Pt will demonstrate intellectual awareness by stating 1 physical and 1 cognitive deficit with Mod A verbal/visual cues.  Skilled Therapeutic Interventions: Pt was seen for skilled ST targeting dysphagia and cognitive goals. During skilled observation of pt consuming Dys 3 (mech soft) lunch items and thin liquids, he demonstrated ability to use swallow precautions with only Supervision A verbal cues and demonstrated functional mastication and oral clearance of POs. Pt required Mod A verbal and visual cues for error awareness and problem solving (in addition to extra time) to organize a BID pill box according to his list of current medications. Initially he did not recall any current medication names or functions, however when SLP introduced "chunking" memory strategy, pt could recall number of types of medicines (blood thinners, vs cholesterol, etc.) with Min A verbal cues after a 15 minute delay. Pt's wife entered room during last 10 minutes of session and SLP emphasized recommendation for 24/7 supervision and assistance with medication management at  home. Pt left sitting in chair with seatbelt alarm in place and needs within reach. Continue per current plan of care.       Pain Pain Assessment Pain Scale: 0-10 Pain Score: 0-No pain  Therapy/Group: Individual Therapy  Arbutus Leas 04/16/2019, 3:15 PM

## 2019-04-16 NOTE — Progress Notes (Signed)
Occupational Therapy Session Note  Patient Details  Name: Jeremy Sherman MRN: [id number] Date of Birth: Mar 04, 1950  Today's Date: 04/16/2019 OT Individual Time: 1130-1200 OT Individual Time Calculation (min): 30 min    Short Term Goals: Week 1:  OT Short Term Goal 1 (Week 1): Pt will complete LB bathing sit to stand with AE and min instructional cueing. OT Short Term Goal 1 - Progress (Week 1): Not met OT Short Term Goal 2 (Week 1): Pt will complete LB dressing sit to stand with mod assist and AE PRN. OT Short Term Goal 2 - Progress (Week 1): Not met OT Short Term Goal 3 (Week 1): Pt will complete toilet transfer with min assist using the RW for support. OT Short Term Goal 3 - Progress (Week 1): Not met OT Short Term Goal 4 (Week 1): Pt will complete walk-in shower transfer with min assist and use of the RW for support. OT Short Term Goal 4 - Progress (Week 1): Not met Week 2:  OT Short Term Goal 1 (Week 2): Pt will complete LB bathing sit to stand with AE and min instructional cueing. OT Short Term Goal 2 (Week 2): Pt will complete LB dressing sit to stand with mod assist and AE PRN. OT Short Term Goal 3 (Week 2): Pt will complete toilet transfer with min assist using the RW for support. OT Short Term Goal 4 (Week 2): Pt will complete walk-in shower transfer with min assist and use of the RW for support.  Skilled Therapeutic Interventions/Progress Updates:    Pt received in wc ready for therapy. Educated pt with return demonstration from patient on seated leg warm up exercises of heel raises and heel slides on floor using washcloths under feet.  Focused on technique with sit to stand. 1st stand with no cues with min A as he tends to keep feet too far forward. Visual demonstration of correct technique and then cued pt to bring feet back and use B hands to push up from w/c then reach for RW. As a result pt completed 4 sit to stands all with SUPERVISION.  During each stand, he  worked on alternating stepping forward 8x with L, 8x with R then needed seated rest breaks as he got short of breath.  Pt needs cues for gentle breathing as he tends to hold his breath.    Excellent participation and carryover during the session.   Pt resting in wc with belt alarm on and all needs met.   Therapy Documentation Precautions:  Precautions Precautions: Fall Precaution Comments: monitor BP Restrictions Weight Bearing Restrictions: No   Pain: Pain Assessment Pain Scale: 0-10 Pain Score: 0-No pain ADL: ADL Eating: Set up Where Assessed-Eating: Bed level Grooming: Supervision/safety Where Assessed-Grooming: Edge of bed Upper Body Bathing: Supervision/safety Where Assessed-Upper Body Bathing: Edge of bed Lower Body Bathing: Moderate assistance Where Assessed-Lower Body Bathing: Edge of bed Upper Body Dressing: Maximal assistance Where Assessed-Upper Body Dressing: Bed level Lower Body Dressing: Maximal assistance Where Assessed-Lower Body Dressing: Edge of bed   Therapy/Group: Individual Therapy  [place] 04/16/2019, 10:52 AM

## 2019-04-16 NOTE — Progress Notes (Signed)
[facility] PHYSICAL MEDICINE & REHABILITATION PROGRESS NOTE   Subjective/Complaints:  Pt states wife will help him at home Was awake yesterday night , too hot in room, was afeb  ROS- no swallowing issues, no CP, no SOB, no N/V/D  Objective:   No results found. No results for input(s): WBC, HGB, HCT, PLT in the last 72 hours. No results for input(s): NA, K, CL, CO2, GLUCOSE, BUN, CREATININE, CALCIUM in the last 72 hours.  Intake/Output Summary (Last 24 hours) at 04/16/2019 0803 Last data filed at 04/16/2019 0448 Gross per 24 hour  Intake 468 ml  Output 1276 ml  Net -808 ml     Physical Exam: Vital Signs Blood pressure 135/73, pulse 60, temperature 97.7 F (36.5 C), resp. rate 20, height 6' 5"  (1.956 m), weight 106.9 kg, SpO2 100 %.   General: No acute distress; sitting up in bed, breakfast tray is beside him, mostly uneaten.  Mood and affect are appropriate Heart: Regular rate and rhythm no rubs murmurs or extra sounds Lungs: Clear to auscultation, breathing unlabored, no rales or wheezes Abdomen: Positive bowel sounds, soft nontender to palpation, nondistended Extremities: No clubbing, cyanosis, or edema Skin: No evidence of breakdown, no evidence of rash Neurologic:, motor strength is 4/5 in right and 5/5 left deltoid, bicep, tricep, grip, hip flexor, knee extensors, ankle dorsiflexor and plantar flexor  Cerebellar exam normal finger to nose to finger as well as heel to shin in bilateral upper and lower extremities Musculoskeletal: Full range of motion in all 4 extremities. No joint swelling, toe nail pain to palpation     Assessment/Plan: 1. Functional deficits secondary to L ACA aneurysm, MCA  Stenosis causing Left hemispheric ischemia which require 3+ hours per day of interdisciplinary therapy in a comprehensive inpatient rehab setting.  Physiatrist is providing close team supervision and 24 hour management of active medical problems listed below.  Physiatrist and  rehab team continue to assess barriers to discharge/monitor patient progress toward functional and medical goals  Care Tool:  Bathing    Body parts bathed by patient: Right arm, Left arm, Chest, Abdomen, Right upper leg, Left upper leg, Face, Front perineal area   Body parts bathed by helper: Right lower leg, Left lower leg, Buttocks Body parts n/a: Left lower leg, Right lower leg   Bathing assist Assist Level: Moderate Assistance - Patient 50 - 74%     Upper Body Dressing/Undressing Upper body dressing   What is the patient wearing?: Pull over shirt    Upper body assist Assist Level: Supervision/Verbal cueing    Lower Body Dressing/Undressing Lower body dressing      What is the patient wearing?: Pants, Incontinence brief     Lower body assist Assist for lower body dressing: Moderate Assistance - Patient 50 - 74%     Toileting Toileting    Toileting assist Assist for toileting: Maximal Assistance - Patient 25 - 49%     Transfers Chair/bed transfer  Transfers assist     Chair/bed transfer assist level: Moderate Assistance - Patient 50 - 74%     Locomotion Ambulation   Ambulation assist   Ambulation activity did not occur: Safety/medical concerns  Assist level: Minimal Assistance - Patient > 75% Assistive device: Walker-rolling Max distance: 10'   Walk 10 feet activity   Assist  Walk 10 feet activity did not occur: Safety/medical concerns  Assist level: Minimal Assistance - Patient > 75% Assistive device: Walker-rolling   Walk 50 feet activity   Assist Walk 50  feet with 2 turns activity did not occur: Safety/medical concerns  Assist level: Minimal Assistance - Patient > 75% Assistive device: Walker-Eva    Walk 150 feet activity   Assist Walk 150 feet activity did not occur: Safety/medical concerns         Walk 10 feet on uneven surface  activity   Assist Walk 10 feet on uneven surfaces activity did not occur: Safety/medical  concerns         Wheelchair     Assist Will patient use wheelchair at discharge?: (TBD)             Wheelchair 50 feet with 2 turns activity    Assist            Wheelchair 150 feet activity     Assist          Blood pressure 135/73, pulse 60, temperature 97.7 F (36.5 C), resp. rate 20, height 6' 5"  (1.956 m), weight 106.9 kg, SpO2 100 %.    Medical Problem List and Plan:  1. 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 Formulized individual treatment plan based on medical history, underlying problem and comorbidities.  PT, OT, SLP evals today  2. Antithrombotics:  -DVT/anticoagulation: Eliquis  -antiplatelet therapy: Brilinta  3. Pain Management: Tramadol as needed  -pain is well controlled 4. Mood: Provide emotional support  -antipsychotic agents: N/A  5. Neuropsych: This patient is capable of making decisions on his own behalf.  6. Skin/Wound Care: Routine skin checks  7. Fluids/Electrolytes/Nutrition: Routine in and outs with follow-up chemistries  8. Hypertension. Patient on no home med antihypertensive medications. Monitor with increased mobility  Vitals:   04/16/19 0500 04/16/19 0502  BP: (!) 143/67 135/73  Pulse: 63 60  Resp: 20 20  Temp: 97.7 F (36.5 C)   SpO2: 100% 100%  Orthostatic hypotension will stop flomax and Urecholine and check daily ortho BPs, may ACE wrap legs, no change lying to sitting   -BP slightly elevated this morning. Reviewed PT note. Was stable today in therapy.  9.  Hyperlipidemia. Lipitor  10. Tobacco abuse. Counseling  11. Prediabetes. Hemoglobin A1c 6.4. SSI. Patient on no diabetic agents prior to admission. Well controlled no meds CBG (last 3)  Recent Labs    04/15/19 1722 04/15/19 2118 04/16/19 0612  GLUCAP 75 126* 95   Controlled   2/17  12. BPH /UTI. Flomax 0.4 mg daily. Urinary retention with painful caths accompanied by hematuria , foley replaced, f/u with urology 13. Hypothyroidism. TSH 45! Synthroid started. Patient will need follow-up thyroid panel in 3 months  2/7- likely reason sleeping so much.   36. Per chart, severe PAD with R foot ischemia- has LE foot/ankle pain on exam that is likely c/w PAD- will monitor closely for skin breakdown, etc.  15.  Onychogryphosis/mycosis- severe OP DPM f/u as they are not availabe for consults in hospital     LOS: 13 days A FACE TO Willard E Trudie Cervantes 04/16/2019, 8:03 AM

## 2019-04-16 NOTE — Patient Care Conference (Signed)
Inpatient RehabilitationTeam Conference and Plan of Care Update Date: 04/16/2019   Time:10:35 AM   Patient Name: Jeremy Sherman      Medical Record Number: [id number]  Date of Birth: 1950/07/02 Sex: Male         Room/Bed: 4W17C/4W17C-01 Payor Info: Payor: MEDICARE / Plan: MEDICARE PART A / Product Type: *No Product type* /    Admit Date/Time:  04/03/2019  2:28 PM  Primary Diagnosis:  Stenosis of right cerebellar artery  Patient Active Problem List   Diagnosis Date Noted  . 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 AKI (acute kidney injury) ([place])   . Aneurysm of anterior cerebral artery   . Essential hypertension   . Prediabetes   . Dysphagia, post-stroke   . Thrombus of left atrial appendage   . 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 Carotid stenosis 03/21/2019  . CAD (coronary artery disease) 03/21/2019  . Brain aneurysm 03/21/2019  . Stroke [facility]) 03/20/2019    Expected Discharge Date: Expected Discharge Date: 04/24/19  Team Members Present: Physician leading conference: Dr. Alysia Penna Social Worker Present: Lennart Pall, LCSW Nurse Present: Dorien Chihuahua, RN;Other (comment)(Blair Montanti, LPN) Case Manager: Karene Fry, RN PT Present: Burnard Bunting, PT OT Present: Clyda Greener, OT SLP Present: Jettie Booze, CF-SLP PPS Coordinator present : Gunnar Fusi, SLP     Current Status/Progress Goal Weekly Team Focus  Bowel/Bladder   Coude foley; LBM: 02/16  maintain regular bowel pattern  assist with toileting needs prn   Swallow/Nutrition/ Hydration   Upgraded to Dys 3/thin, intermittent supervision  Supervision A least restrictive environment  tolerance Dys 3/thin diet, carryover swallow strategies, susatined attention to intake   ADL's   supervision for UB selfcare, min assist for LB bathing with mod assist for LB dressing.  Min to mod assist for sit to stand and functional transfers  supervision  selfcare retraining, balance retraining, transfer  training, DME education, therapeutic activities   Mobility   min A bed mobility, min/mod A sit<>stands w/ RW (elevate surface) and transfers, gait up to 136 ft with Harmon Pier walker (looks better for posture) minA, 4 step with B rails and minA - heavy reliance on UEs  supervision-CGA overall, minA for stairs  transfers, gait, LE stretching, activity tolerance, dynamic standing balance   Communication             Safety/Cognition/ Behavioral Observations  Mod A basic to mildly complex tasks, Min-Mod A for use of calendar for orienting to time, Sup-Min A selective attention  Supervision-Min A  error awareness, orientation to time, basic problem solving, recall with aids   Pain   no c/o pain  reamin pain free  assess pain QS and prn   Skin   healing surgical incisions on L neck and L groin  remain free of new skin breakdown and infection  assess skin QS and prn    Rehab Goals Patient on target to meet rehab goals: Yes *See Care Plan and progress notes for long and short-term goals.     Barriers to Discharge  Current Status/Progress Possible Resolutions Date Resolved   Nursing                  PT                    OT                  SLP  SW Home environment access/layout Home with wife Daughter from [place] in/out of home can provide intermittent assist; plan for wife to assist patient as needed at discharge          Discharge Planning/Teaching Needs:  Home with wife who can provide 24/7 assistance  TBD   Team Discussion: Needs podiatry follow up, severe cognitive deficits, cerebellar CVA, has foley, f/u with urology, enc. Fluids.  RN feet pain.  OT S UB B/D, LB B min A, sit to stand min A, min a toilet transfers, confusion, moans/groans, light headed with standing more than 1 minute, goals S, downgrade goals to min A.  PT CGA/min A bed and transfers, min/mod amb, wife available, rails installed at home.  SLP D3thins, cognition mod/max for basic prob solving, attention min  A, goals S/min A.   Revisions to Treatment Plan: N/A     Medical Summary Current Status: No issues overnight, remains confused blood pressure control overall improving however some lability noted. Weekly Focus/Goal: Endurance, orientation memory and attention  Barriers to Discharge: Medical stability;Incontinence   Possible Resolutions to Barriers: Medication management to reduce orthostatic drops   Continued Need for Acute Rehabilitation Level of Care: The patient requires daily medical management by a physician with specialized training in physical medicine and rehabilitation for the following reasons: Direction of a multidisciplinary physical rehabilitation program to maximize functional independence : Yes Medical management of patient stability for increased activity during participation in an intensive rehabilitation regime.: Yes Analysis of laboratory values and/or radiology reports with any subsequent need for medication adjustment and/or medical intervention. : Yes   I attest that I was present, lead the team conference, and concur with the assessment and plan of the team.   Retta Diones 04/16/2019, 2:33 PM   Team conference was held via web/ teleconference due to [place] - 19

## 2019-04-17 ENCOUNTER — Inpatient Hospital Stay (HOSPITAL_COMMUNITY): Payer: BC Managed Care – PPO | Admitting: Speech Pathology

## 2019-04-17 ENCOUNTER — Inpatient Hospital Stay (HOSPITAL_COMMUNITY): Payer: BC Managed Care – PPO | Admitting: Physical Therapy

## 2019-04-17 ENCOUNTER — Encounter (HOSPITAL_COMMUNITY): Payer: BC Managed Care – PPO

## 2019-04-17 ENCOUNTER — Inpatient Hospital Stay (HOSPITAL_COMMUNITY): Payer: BC Managed Care – PPO | Admitting: Occupational Therapy

## 2019-04-17 ENCOUNTER — Ambulatory Visit: Payer: BC Managed Care – PPO | Admitting: Vascular Surgery

## 2019-04-17 LAB — GLUCOSE, CAPILLARY
Glucose-Capillary: 92 mg/dL (ref 70–99)
Glucose-Capillary: 76 mg/dL (ref 70–99)
Glucose-Capillary: 88 mg/dL (ref 70–99)

## 2019-04-17 MED ORDER — TAMSULOSIN HCL 0.4 MG PO CAPS
0.4000 mg | ORAL_CAPSULE | Freq: Every day | ORAL | Status: DC
  Administered 2019-04-17 – 2019-04-20 (×4): 0.4 mg via ORAL
  Filled 2019-04-17 (×4): qty 1

## 2019-04-17 NOTE — Plan of Care (Signed)
  Problem: RH Stairs Goal: LTG Patient will ambulate up and down stairs w/assist (PT) Description: LTG: Patient will ambulate up and down # of stairs with assistance (PT) Flowsheets (Taken 04/17/2019 1802) LTG: Pt will ambulate up/down stairs assist needed:: (updated to reflect now having handrails installed) Contact Guard/Touching assist LTG: Pt will  ambulate up and down number of stairs: 4steps with handrails per home set-up Note: updated to reflect now having handrails installed

## 2019-04-17 NOTE — Progress Notes (Signed)
Physical Therapy Session Note  Patient Details  Name: Jeremy Sherman MRN: [id number] Date of Birth: 12/26/1950  Today's Date: 04/17/2019 PT Individual Time: 1132-1200 PT Individual Time Calculation (min): 28 min   Short Term Goals: Week 2:  PT Short Term Goal 1 (Week 2): Pt will consistently perform supine<>sit with min assist PT Short Term Goal 2 (Week 2): Pt will perform bed<>chair transfers using LRAD with CGA PT Short Term Goal 3 (Week 2): Pt will consistently ambulate at least 50ft using LRAD with min assist of 1 person PT Short Term Goal 4 (Week 2): Pt will ascend/descend 2 steps using handrails with mod assist Week 3:     Skilled Therapeutic Interventions/Progress Updates:   Pt received sitting in WC and agreeable to PT. PT instructed pt in dynamic balance training with 1-2UE support on the RW. Reciprocal marches x 10 BLE, lateral reaches in semitandem stance x 10 Bil. Sit<>stand from WC x 5 (56 seconds) with min-CGA from PT for anterior weight shift with cues for full erect posture as well as proper LE placement.  Lateral and anterior/posterior pertubations with no UE support x 8 in each direction. Pt able to engage ankle strategy affectively with cues to prevent LOB in all directions. Patient left sitting in WC with call bell in reach and all needs met.          Therapy Documentation Precautions:  Precautions Precautions: Fall Precaution Comments: monitor BP Restrictions Weight Bearing Restrictions: No Pain: Pain Assessment Pain Scale: Faces Faces Pain Scale: Hurts a little bit Pain Type: Chronic pain Pain Location: Foot Pain Orientation: Right;Left Pain Descriptors / Indicators: Grimacing;Moaning Pain Onset: With Activity Pain Intervention(s): Repositioned    Therapy/Group: Individual Therapy  Austin E Tucker 04/17/2019, 11:59 AM  

## 2019-04-17 NOTE — Progress Notes (Signed)
Occupational Therapy Weekly Progress Note  Patient Details  Name: Jeremy Sherman MRN: [id number] Date of Birth: Apr 07, 1950  Beginning of progress report period: April 11, 2019 End of progress report period: April 17, 2019  Today's Date: 04/17/2019 OT Individual Time: 3354-5625 OT Individual Time Calculation (min): 74 min    Patient has met 4 of 4 short term goals.  Mr. Jeremy Sherman is making steady progress with OT at this time.  He currently completes all UB selfcare with supervision.  LB bathing is at a min assist level with use of AE for washing his lower legs and feet.  LB dressing is at a mod assist level overall with continued need for total assist to donn and doff gripper socks secondary to his inability to reach his feet efficiently.  He is able to complete toilet transfers at min assist level with use of the RW and the 3:1.  He still however demonstrates bowel incontinence without any awareness of this.  Endurance is improving but continues to be limited overall.  With standing he is able to tolerate around 2-3 mins before needing to sit secondary to fatigue as well as a light headed feeling.  His BP has been running lower with use of ace bandage wrapping to his lower legs to assist with this.  Confusion is still present at times, with pt demonstrating decreased awareness of time as well as decreased memory of what happened in earlier sessions.  Overall feel he is making steady progress but feel he will likely need more min guard to min assist at discharge next week.  Recommended continued CIR level therapy until then with goals to be downgraded slightly based on progress.   Patient continues to demonstrate the following deficits: muscle weakness, unbalanced muscle activation and decreased coordination, decreased attention, decreased awareness, decreased problem solving and decreased memory and decreased standing balance, decreased postural control and decreased balance  strategies and therefore will continue to benefit from skilled OT intervention to enhance overall performance with BADL and Reduce care partner burden.  Patient not progressing toward long term goals.  See goal revision..  Continue plan of care.  OT Short Term Goals Week 3:  OT Short Term Goal 1 (Week 3): Continue working on established LTgs downgraded to min guard assist overall  Skilled Therapeutic Interventions/Progress Updates:    Pt worked on shower and dressing during session.   He was able to transfer from supine to sit EOB with supervision and then complete functional mobility to the toilet with min assist and use of the RW.  Increased trunk flexion noted in standing.  He exhibited incontinence of bowel in his brief prior to reaching the toilet.  He needed mod assist for clothing management as well as hygiene.  Min assist for transfer over to the tub bench with max assist for removal of gripper socks and pants.  He completed bathing with mod instructional cueing for thoroughness and for sequencing wetting the washcloth and applying more soap.  Mod assist for sit to stand when washing his buttocks with max assist needed for thoroughness. He used the [facility] sponge with min instructional cueing for washing his lower legs and feet.  After drying off, he transferred out to the sink where he worked on dressing with supervision for UB and mod assist for LB secondary to catheter.  He also used the reacher to help thread the brief and pants.  He needed mod assist for sit to stand to pull them over his hips.  He needed total assist for donning gripper socks.  Finished session with pt sitting up in the wheelchair with call button and phone in reach and safety belt in place.    Therapy Documentation Precautions:  Precautions Precautions: Fall Precaution Comments: monitor BP Restrictions Weight Bearing Restrictions: No  Pain: Pain Assessment Pain Scale: Faces Faces Pain Scale: Hurts a little bit Pain  Type: Chronic pain Pain Location: Foot Pain Orientation: Right;Left Pain Descriptors / Indicators: Grimacing;Moaning Pain Onset: With Activity Pain Intervention(s): Repositioned ADL: See Care Tool Section for some details of mobility and selfcare  Therapy/Group: Individual Therapy  Allina Riches OTR/L 04/17/2019, 10:27 AM

## 2019-04-17 NOTE — Plan of Care (Signed)
  Problem: RH Balance Goal: LTG Patient will maintain dynamic standing with ADLs (OT) Description: LTG:  Patient will maintain dynamic standing balance with assist during activities of daily living (OT)  Flowsheets (Taken 04/17/2019 1215) LTG: Pt will maintain dynamic standing balance during ADLs with: (goal downgraded based on progress) Contact Guard/Touching assist   Problem: Sit to Stand Goal: LTG:  Patient will perform sit to stand in prep for activites of daily living with assistance level (OT) Description: LTG:  Patient will perform sit to stand in prep for activites of daily living with assistance level (OT) Flowsheets (Taken 04/17/2019 1215) LTG: PT will perform sit to stand in prep for activites of daily living with assistance level: (goal downgraded based on progress) Contact Guard/Touching assist   Problem: RH Bathing Goal: LTG Patient will bathe all body parts with assist levels (OT) Description: LTG: Patient will bathe all body parts with assist levels (OT) Flowsheets (Taken 04/17/2019 1215) LTG: Pt will perform bathing with assistance level/cueing: (goal downgraded based on progress) Contact Guard/Touching assist   Problem: RH Dressing Goal: LTG Patient will perform lower body dressing w/assist (OT) Description: LTG: Patient will perform lower body dressing with assist, with/without cues in positioning using equipment (OT) Flowsheets (Taken 04/17/2019 1215) LTG: Pt will perform lower body dressing with assistance level of: (goal downgraded based on progress) Moderate Assistance - Patient 50 - 74%   Problem: RH Toileting Goal: LTG Patient will perform toileting task (3/3 steps) with assistance level (OT) Description: LTG: Patient will perform toileting task (3/3 steps) with assistance level (OT)  Flowsheets (Taken 04/17/2019 1215) LTG: Pt will perform toileting task (3/3 steps) with assistance level: (goal downgraded based on progress) Contact Guard/Touching assist   Problem: RH  Toilet Transfers Goal: LTG Patient will perform toilet transfers w/assist (OT) Description: LTG: Patient will perform toilet transfers with assist, with/without cues using equipment (OT) Flowsheets (Taken 04/17/2019 1215) LTG: Pt will perform toilet transfers with assistance level of: (goal downgraded based on progress) Contact Guard/Touching assist   Problem: RH Tub/Shower Transfers Goal: LTG Patient will perform tub/shower transfers w/assist (OT) Description: LTG: Patient will perform tub/shower transfers with assist, with/without cues using equipment (OT) Flowsheets (Taken 04/17/2019 1215) LTG: Pt will perform tub/shower stall transfers with assistance level of: (goal downgraded based on progress) Minimal Assistance - Patient > 75%   Problem: RH Memory Goal: LTG Patient will demonstrate ability for day to day recall/carry over during activities of daily living with assistance level (OT) Description: LTG:  Patient will demonstrate ability for day to day recall/carry over during activities of daily living with assistance level (OT). Flowsheets (Taken 04/17/2019 1215) LTG:  Patient will demonstrate ability for day to day recall/carry over during activities of daily living with assistance level (OT): (goal downgraded based on progress) Minimal Assistance - Patient > 75%   Problem: RH Awareness Goal: LTG: Patient will demonstrate awareness during functional activites type of (OT) Description: LTG: Patient will demonstrate awareness during functional activites type of (OT) Flowsheets (Taken 04/17/2019 1215) Patient will demonstrate awareness during functional activites type of: Intellectual LTG: Patient will demonstrate awareness during functional activites type of (OT): (goal downgraded based on progress) Minimal Assistance - Patient > 75%

## 2019-04-17 NOTE — Progress Notes (Signed)
Speech Language Pathology Daily Session Note  Patient Details  Name: Jeremy Sherman MRN: [id number] Date of Birth: 1950/12/21  Today's Date: 04/17/2019 SLP Individual Time: 1355-1450 SLP Individual Time Calculation (min): 55 min  Short Term Goals: Week 2: SLP Short Term Goal 1 (Week 2): Pt will consume trials of Dys 3 solids supervision cues for effective mastication and oral clearance X3 prior to upgrade. SLP Short Term Goal 2 (Week 2): Pt will demonstrate ability to problem solve during functional familiar tasks with Min A verbal/visual cues. SLP Short Term Goal 3 (Week 2): Pt will selectively attend to tasks for 10 minutes with Min A verbal/verbal cues for redirection. SLP Short Term Goal 4 (Week 2): Pt will recall new and daily information with Mod A verbal/visual cues for use of compensatory strategies. SLP Short Term Goal 5 (Week 2): Pt will demonstrate intellectual awareness by stating 1 physical and 1 cognitive deficit with Mod A verbal/visual cues.  Skilled Therapeutic Interventions: Skilled treatment session focused on cognitive goals. SLP facilitated session by providing Min-Mod A verbal cues with extra time for mildly complex problem solving during a calendar organization task. Patient demonstrated selective attention to task in a mildly distracting environment for ~45 minutes with Mod I. Patient also independently utilized his external aids to recall the date. Patient left upright in the wheelchair with alarm on and all needs within reach. Continue with current plan of care.      Pain No/Denies Pain   Therapy/Group: Individual Therapy  Lakira Ogando 04/17/2019, 2:53 PM

## 2019-04-17 NOTE — Progress Notes (Signed)
Physical Therapy Weekly Progress Note  Patient Details  Name: Jeremy Sherman MRN: [id number] Date of Birth: Oct 02, 1950  Beginning of progress report period: April 10, 2019 End of progress report period: April 17, 2019  Today's Date: 04/17/2019 PT Individual Time: 2035-5974 PT Individual Time Calculation (min): 43 min   Patient has met 3 of 4 short term goals. Mr. Puthoff has made good progress this week demonstrating improving independence with functional mobility. He is performing supine<>sit with min assist primarily for B LE management into the bed, sit<>stands and stand pivot transfers using RW with min assist, and ambulating up to 130f using RW with min assist as well as ascending/descending 4 steps using handrails with min assist. Patient has not been demonstrating/reporting symptoms of orthostatic hypotension allowing increased participation in standing therapeutic interventions.   Patient continues to demonstrate the following deficits muscle weakness and muscle joint tightness, decreased cardiorespiratoy endurance, impaired timing and sequencing, unbalanced muscle activation and decreased motor planning, decreased attention, decreased initiation, decreased awareness, decreased problem solving, decreased safety awareness, decreased memory and delayed processing and decreased sitting balance, decreased standing balance, decreased postural control and decreased balance strategies and therefore will continue to benefit from skilled PT intervention to increase functional independence with mobility.  Patient progressing toward long term goals. Long term stair goal modified to reflect pt's updated home entry.  Continue plan of care.  PT Short Term Goals Week 2:  PT Short Term Goal 1 (Week 2): Pt will consistently perform supine<>sit with min assist PT Short Term Goal 1 - Progress (Week 2): Met PT Short Term Goal 2 (Week 2): Pt will perform bed<>chair transfers using LRAD with  CGA PT Short Term Goal 2 - Progress (Week 2): Progressing toward goal PT Short Term Goal 3 (Week 2): Pt will consistently ambulate at least 538fusing LRAD with min assist of 1 person PT Short Term Goal 3 - Progress (Week 2): Met PT Short Term Goal 4 (Week 2): Pt will ascend/descend 2 steps using handrails with mod assist PT Short Term Goal 4 - Progress (Week 2): Met Week 3:  PT Short Term Goal 1 (Week 3): = to LTGs based on ELOS  Skilled Therapeutic Interventions/Progress Updates:  Ambulation/gait training;Community reintegration;DME/adaptive equipment instruction;Neuromuscular re-education;Psychosocial support;Stair training;UE/LE Strength taining/ROM;Wheelchair propulsion/positioning;Balance/vestibular training;Discharge planning;Functional electrical stimulation;Pain management;Skin care/wound management;Therapeutic Activities;UE/LE Coordination activities;Cognitive remediation/compensation;Disease management/prevention;Functional mobility training;Patient/family education;Splinting/orthotics;Therapeutic Exercise;Visual/perceptual remediation/compensation  Pt received supine in bed and with encouragement agreeable to therapy session as pt thought it was time to go to sleep - reoriented pt to time of day. NT in/out and RN in/out. Pt already has B LE ACE wraps donned for BP management. Supine>sitting, HOB partially elevated and relying on bedrail for trunk upright with close supervision and increased time. Sit>stand EOB>RW with CGA and pt requiring increased time relying heavily on B UE support to come to standing. Stand pivot transfer to w/c using RW with CGA/min assist for steadying.  Transported to/from gym in w/c for energy conservation. Gait training ~9054fsing RW with CGA for steadying and pt demonstrating improved upright trunk posture though still has minor increased hip flexion - continues to have decreased B LE step length and gait speed - cuing for improvements. Dynamic standing balance task  of cross body reaching to place clothespins on basketball goal net without UE support with CGA for steadying - manual facilitation and cuing for increased trunk/hip extension for upright posture.  Stand pivot w/c>EOM using RW with CGA for steadying and pt continuing to demonstrate  heavy reliance on B UE support to come to stand via pushing up on armrests and then walking feet backwards under him. Repeated sit<>stands to/from significantly elevated EOM initially using B UE support on mat when coming to stand progressed to no UE support in either direction with cuing for increased anterior weight shift x7 reps - pt reporting this is very tiring. Gait training ~155f using RW with CGA for steadying and pt continuing to demonstrate above gait impairments. Transported back to room and pt left seated in w/c with needs in reach, seat belt alarm on, and meal tray set-up - RN notified of pt's position.   Therapy Documentation Precautions:  Precautions Precautions: Fall Precaution Comments: monitor BP Restrictions Weight Bearing Restrictions: No  Pain: Denies pain during session though pt continues to moan/groan with movement.  Therapy/Group: Individual Therapy  CTawana Scale PT, DPT 04/17/2019, 4:03 PM

## 2019-04-17 NOTE — Progress Notes (Signed)
[facility] PHYSICAL MEDICINE & REHABILITATION PROGRESS NOTE   Subjective/Complaints:  No issues overnight, no pains today.  Urine in Foley bag looks pinkish  ROS- no swallowing issues, no CP, no SOB, no N/V/D  Objective:   No results found. No results for input(s): WBC, HGB, HCT, PLT in the last 72 hours. No results for input(s): NA, K, CL, CO2, GLUCOSE, BUN, CREATININE, CALCIUM in the last 72 hours.  Intake/Output Summary (Last 24 hours) at 04/17/2019 0924 Last data filed at 04/17/2019 [id number] Gross per 24 hour  Intake 472 ml  Output 1225 ml  Net -753 ml     Physical Exam: Vital Signs Blood pressure (!) 143/78, pulse 65, temperature 98.4 F (36.9 C), temperature source Oral, resp. rate 18, height 6\' 5"  (1.956 m), weight 106.9 kg, SpO2 100 %.   General: No acute distress; sitting up in bed, breakfast tray is beside him, mostly uneaten.  Mood and affect are appropriate Heart: Regular rate and rhythm no rubs murmurs or extra sounds Lungs: Clear to auscultation, breathing unlabored, no rales or wheezes Abdomen: Positive bowel sounds, soft nontender to palpation, nondistended Extremities: No clubbing, cyanosis, or edema Skin: No evidence of breakdown, no evidence of rash Neurologic:, motor strength is 4/5 in right and 5/5 left deltoid, bicep, tricep, grip, hip flexor, knee extensors, ankle dorsiflexor and plantar flexor  Cerebellar exam normal finger to nose to finger as well as heel to shin in bilateral upper and lower extremities Musculoskeletal: Full range of motion in all 4 extremities. No joint swelling, toe nail pain to palpation     Assessment/Plan: 1. Functional deficits secondary to L ACA aneurysm, MCA  Stenosis causing Left hemispheric ischemia which require 3+ hours per day of interdisciplinary therapy in a comprehensive inpatient rehab setting.  Physiatrist is providing close team supervision and 24 hour management of active medical problems listed  below.  Physiatrist and rehab team continue to assess barriers to discharge/monitor patient progress toward functional and medical goals  Care Tool:  Bathing    Body parts bathed by patient: Right arm, Left arm, Chest, Abdomen, Right upper leg, Left upper leg, Face, Front perineal area, Right lower leg, Left lower leg   Body parts bathed by helper: Buttocks Body parts n/a: Left lower leg, Right lower leg   Bathing assist Assist Level: Minimal Assistance - Patient > 75%     Upper Body Dressing/Undressing Upper body dressing   What is the patient wearing?: Pull over shirt    Upper body assist Assist Level: Supervision/Verbal cueing    Lower Body Dressing/Undressing Lower body dressing      What is the patient wearing?: Pants, Incontinence brief     Lower body assist Assist for lower body dressing: Moderate Assistance - Patient 50 - 74%     Toileting Toileting    Toileting assist Assist for toileting: Maximal Assistance - Patient 25 - 49%     Transfers Chair/bed transfer  Transfers assist     Chair/bed transfer assist level: Minimal Assistance - Patient > 75%     Locomotion Ambulation   Ambulation assist   Ambulation activity did not occur: Safety/medical concerns  Assist level: Minimal Assistance - Patient > 75% Assistive device: Ethelene Hal Max distance: 140'   Walk 10 feet activity   Assist  Walk 10 feet activity did not occur: Safety/medical concerns  Assist level: Minimal Assistance - Patient > 75% Assistive device: Walker-Eva   Walk 50 feet activity   Assist Walk 50 feet with 2 turns  activity did not occur: Safety/medical concerns  Assist level: Minimal Assistance - Patient > 75% Assistive device: Walker-Eva    Walk 150 feet activity   Assist Walk 150 feet activity did not occur: Safety/medical concerns         Walk 10 feet on uneven surface  activity   Assist Walk 10 feet on uneven surfaces activity did not occur:  Safety/medical concerns         Wheelchair     Assist Will patient use wheelchair at discharge?: (TBD)             Wheelchair 50 feet with 2 turns activity    Assist            Wheelchair 150 feet activity     Assist          Blood pressure (!) 143/78, pulse 65, temperature 98.4 F (36.9 C), temperature source Oral, resp. rate 18, height 6\' 5"  (1.956 m), weight 106.9 kg, SpO2 100 %.    Medical Problem List and Plan:  1. 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 Antithrombotics:  -DVT/anticoagulation: Eliquis  -antiplatelet therapy: Brilinta  3. Pain Management: Tramadol as needed  -pain is well controlled 4. Mood: Provide emotional support  -antipsychotic agents: N/A  5. Neuropsych: This patient is capable of making decisions on his own behalf.  6. Skin/Wound Care: Routine skin checks  7. Fluids/Electrolytes/Nutrition: Routine in and outs with follow-up chemistries  8. Hypertension. Patient on no home med antihypertensive medications. Monitor with increased mobility  Vitals:   04/16/19 1930 04/17/19 0601  BP: 133/77 (!) 143/78  Pulse: 72 65  Resp: 16 18  Temp: 98.7 F (37.1 C) 98.4 F (36.9 C)  SpO2: 100% 100%  Orthostatic hypotension will stop flomax and Urecholine and check daily ortho BPs, may ACE wrap legs, no change lying to sitting, blood pressure improving, may consider restarting Flomax so that voiding trial with urology may be successful  -BP slightly elevated this morning. Reviewed PT note. Was stable today in therapy.  9. Hyperlipidemia.  Lipitor  10. Tobacco abuse. Counseling  11. Prediabetes. Hemoglobin A1c 6.4. SSI. Patient on no diabetic agents prior to admission. Well controlled no meds CBG (last 3)  Recent Labs    04/15/19 2118 04/16/19 0612 04/17/19 0547  GLUCAP 126* 95 92   Controlled   2/18  12. BPH /UTI. Flomax 0.4 mg daily. Urinary retention with painful caths accompanied by hematuria , foley replaced, f/u with urology, some minimal hematuria 13. Hypothyroidism. TSH 45! Synthroid started. Patient will need follow-up thyroid panel in 3 months  2/7- likely reason sleeping so much.   61. Per chart, severe PAD with R foot ischemia- has LE foot/ankle pain on exam that is likely c/w PAD- will monitor closely for skin breakdown, etc.  15.  Onychogryphosis/mycosis- severe OP DPM f/u as they are not availabe for consults in hospital     LOS: 14 days A FACE TO FACE EVALUATION WAS PERFORMED  Charlett Blake 04/17/2019, 9:24 AM

## 2019-04-18 ENCOUNTER — Inpatient Hospital Stay (HOSPITAL_COMMUNITY): Payer: BC Managed Care – PPO | Admitting: Physical Therapy

## 2019-04-18 ENCOUNTER — Inpatient Hospital Stay (HOSPITAL_COMMUNITY): Payer: BC Managed Care – PPO | Admitting: Speech Pathology

## 2019-04-18 ENCOUNTER — Inpatient Hospital Stay (HOSPITAL_COMMUNITY): Payer: BC Managed Care – PPO | Admitting: Occupational Therapy

## 2019-04-18 LAB — GLUCOSE, CAPILLARY
Glucose-Capillary: 97 mg/dL (ref 70–99)
Glucose-Capillary: 122 mg/dL — ABNORMAL HIGH (ref 70–99)
Glucose-Capillary: 96 mg/dL (ref 70–99)

## 2019-04-18 MED ORDER — CHLORHEXIDINE GLUCONATE CLOTH 2 % EX PADS
6.0000 | MEDICATED_PAD | Freq: Every day | CUTANEOUS | Status: DC
  Administered 2019-04-19 – 2019-04-24 (×6): 6 via TOPICAL

## 2019-04-18 MED ORDER — ONDANSETRON HCL 4 MG PO TABS
4.0000 mg | ORAL_TABLET | Freq: Three times a day (TID) | ORAL | Status: DC | PRN
  Administered 2019-04-18 – 2019-04-19 (×4): 4 mg via ORAL
  Filled 2019-04-18 (×4): qty 1

## 2019-04-18 MED ORDER — DICLOFENAC SODIUM 1 % EX GEL
2.0000 g | Freq: Four times a day (QID) | CUTANEOUS | Status: DC
  Administered 2019-04-18 – 2019-04-24 (×22): 2 g via TOPICAL
  Filled 2019-04-18: qty 100

## 2019-04-18 MED ORDER — AMMONIUM LACTATE 12 % EX LOTN
TOPICAL_LOTION | Freq: Two times a day (BID) | CUTANEOUS | Status: DC
  Filled 2019-04-18: qty 225

## 2019-04-18 NOTE — Progress Notes (Addendum)
[facility] PHYSICAL MEDICINE & REHABILITATION PROGRESS NOTE   Subjective/Complaints:  No issues overnight except for left foot pain. The patient cannot tell me which part of his foot is bothering him. ROS- no swallowing issues, no CP, no SOB, no N/V/D  Objective:   No results found. No results for input(s): WBC, HGB, HCT, PLT in the last 72 hours. No results for input(s): NA, K, CL, CO2, GLUCOSE, BUN, CREATININE, CALCIUM in the last 72 hours.  Intake/Output Summary (Last 24 hours) at 04/18/2019 0759 Last data filed at 04/18/2019 0600 Gross per 24 hour  Intake 600 ml  Output 1175 ml  Net -575 ml     Physical Exam: Vital Signs Blood pressure (!) 141/72, pulse 80, temperature 99.1 F (37.3 C), temperature source Oral, resp. rate 19, height 6\' 5"  (1.956 m), weight 106.9 kg, SpO2 99 %.   General: No acute distress Mood and affect are appropriate Heart: Regular rate and rhythm no rubs murmurs or extra sounds Lungs: Clear to auscultation, breathing unlabored, no rales or wheezes Abdomen: Positive bowel sounds, soft nontender to palpation, nondistended Extremities: No clubbing, cyanosis, or edema Skin: No evidence of breakdown on the heels, dry skin bilateral foot and ankle area  Neurologic: Cranial nerves II through XII intact, motor strength is 5/5 in bilateral deltoid, bicep, tricep, grip, 4/5 bilateral hip flexor, knee extensors, ankle dorsiflexor and plantar flexor Sensory exam normal sensation to light touch and proprioception in bilateral upper and lower extremities Cerebellar exam normal finger to nose to finger as well as heel to shin in bilateral upper and lower extremities Musculoskeletal: Full range of motion in all 4 extremities. No joint swelling, tenderness to palpation back of Left heel without breakdown    Assessment/Plan: 1. Functional deficits secondary to L ACA aneurysm, MCA  Stenosis causing Left hemispheric ischemia which require 3+ hours per day of  interdisciplinary therapy in a comprehensive inpatient rehab setting.  Physiatrist is providing close team supervision and 24 hour management of active medical problems listed below.  Physiatrist and rehab team continue to assess barriers to discharge/monitor patient progress toward functional and medical goals  Care Tool:  Bathing    Body parts bathed by patient: Right arm, Left arm, Chest, Abdomen, Right upper leg, Left upper leg, Face, Front perineal area, Right lower leg, Left lower leg   Body parts bathed by helper: Buttocks Body parts n/a: Left lower leg, Right lower leg   Bathing assist Assist Level: Minimal Assistance - Patient > 75%     Upper Body Dressing/Undressing Upper body dressing   What is the patient wearing?: Pull over shirt    Upper body assist Assist Level: Supervision/Verbal cueing    Lower Body Dressing/Undressing Lower body dressing      What is the patient wearing?: Pants, Incontinence brief     Lower body assist Assist for lower body dressing: Moderate Assistance - Patient 50 - 74%     Toileting Toileting    Toileting assist Assist for toileting: Maximal Assistance - Patient 25 - 49%     Transfers Chair/bed transfer  Transfers assist     Chair/bed transfer assist level: Contact Guard/Touching assist     Locomotion Ambulation   Ambulation assist   Ambulation activity did not occur: Safety/medical concerns  Assist level: Contact Guard/Touching assist Assistive device: Walker-rolling Max distance: 137ft   Walk 10 feet activity   Assist  Walk 10 feet activity did not occur: Safety/medical concerns  Assist level: Contact Guard/Touching assist Assistive device: Walker-rolling  Walk 50 feet activity   Assist Walk 50 feet with 2 turns activity did not occur: Safety/medical concerns  Assist level: Contact Guard/Touching assist Assistive device: Walker-rolling    Walk 150 feet activity   Assist Walk 150 feet activity  did not occur: Safety/medical concerns         Walk 10 feet on uneven surface  activity   Assist Walk 10 feet on uneven surfaces activity did not occur: Safety/medical concerns         Wheelchair     Assist Will patient use wheelchair at discharge?: (TBD)             Wheelchair 50 feet with 2 turns activity    Assist            Wheelchair 150 feet activity     Assist          Blood pressure (!) 141/72, pulse 80, temperature 99.1 F (37.3 C), temperature source Oral, resp. rate 19, height 6\' 5"  (1.956 m), weight 106.9 kg, SpO2 99 %.    Medical Problem List and Plan:  1. 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 Antithrombotics:  -DVT/anticoagulation: Eliquis  -antiplatelet therapy: Brilinta  3. Pain Management: Tramadol as needed  -pain is well controlled 4. Mood: Provide emotional support  -antipsychotic agents: N/A  5. Neuropsych: This patient is capable of making decisions on his own behalf.  6. Skin/Wound Care: Routine skin checks  7. Fluids/Electrolytes/Nutrition: Routine in and outs with follow-up chemistries  8. Hypertension. Patient on no home med antihypertensive medications. Monitor with increased mobility  Vitals:   04/18/19 0604 04/18/19 0607  BP: (!) 145/63 (!) 141/72  Pulse: 66 80  Resp: 19   Temp: 99.1 F (37.3 C)   SpO2: 99%   Orthostatic hypotension will stop flomax and Urecholine and check daily ortho BPs, may ACE wrap legs, no change lying to sitting, blood pressure improving,restarted Flomax 2/18  so that voiding trial with  urology may be successful  -BP slightly elevated this morning. Reviewed PT note. Was stable today in therapy.  9. Hyperlipidemia. Lipitor  10. Tobacco abuse. Counseling  11. Prediabetes. Hemoglobin A1c 6.4. SSI. Patient on no diabetic agents prior to admission. Well controlled no meds CBG (last 3)  Recent Labs    04/17/19 1126 04/17/19 1657 04/18/19 0605  GLUCAP 76 88 97   Controlled   2/19  12. BPH /UTI. Flomax 0.4 mg daily. Urinary retention with painful caths accompanied by hematuria , foley replaced, f/u with urology, some minimal hematuria 13. Hypothyroidism. TSH 45! Synthroid started. Patient will need follow-up thyroid panel in 3 months  2/7- likely reason sleeping so much.   73. Per chart, severe PAD with R foot ischemia- has LE foot/ankle pain on exam that is likely c/w PAD- will monitor closely for skin breakdown, etc.  15.  Onychogryphosis/mycosis- severe OP DPM f/u as they are not availabe for consults in hospital   16. Left post heel pain, ? achiiles tendinopathy, prevalon boot and diclofenac gel   LOS: 15 days A FACE TO FACE EVALUATION WAS PERFORMED  Charlett Blake 04/18/2019, 7:59  AM    

## 2019-04-18 NOTE — Progress Notes (Signed)
Physical Therapy Session Note  Patient Details  Name: Jeremy Sherman MRN: [id number] Date of Birth: 1951/02/17  Today's Date: 04/18/2019 PT Individual Time: [id number] PT Individual Time Calculation (min): 69 min   Short Term Goals: Week 3:  PT Short Term Goal 1 (Week 3): = to LTGs based on ELOS  Skilled Therapeutic Interventions/Progress Updates:    Pt received supine in bed and agreeable to therapy session. Therapist educated pt on ensuring nursing staff doffs B LE ACE wraps at night - therapist placed note on pt's white board. Therapist re-wrapped L LE as ACE wrap loose. Supine>sit, HOB flat and not using bedrails, with supervision and increased time. Sitting EOB donned pants with mod assist for time management and pt recalling need to use reacher to assist with task. Sit>stand elevated EOB>RW with CGA/min assist for steadying and same assist while therapist completed pulling pants over hips. Stand pivot to w/c using RW with CGA/min assist for balance and cuing for turning fully prior to initiating sit.  Transported to/from gym in w/c for energy conservation. Stand pivot w/c>Nustep using RW again with CGA for steadying, increased time, and pt relying heavily on pushing up via B UEs on armrests to come to stand. Performed B LE strengthening task on Nustep via reciprocal movement pattern against level 4 resistance for 7 minutes with cuing for increased speed of movement and pt averaging ~30steps per minute totaling 195 steps. Stand pivot back to w/c as described above.  Transported to main therapy gym. Therapist provided reacher bag for RW. Stand pivot to EOM using RW with CGA for steadying. Standing with B UE support on RW and CGA for steadying therapist donned maxisky sling for safety. Gait training forward/backwards in maxisky sling using RW with close supervision - demonstrates significantly decreased step length walking backwards and very slow speed of movement - cuing for improvement.  Pt reports he is feeling very tired today but agreeable to continue therapy session. L lateral side stepping ~57ft in maxisky sling using RW with CGA for steadying and manual facilitation for weight shifting and increased step length. Dynamic standing balance task in maxisky sling of ball toss without UE support with close supervision and pt able to utilize ankle strategy to maintain balance. Doffed maxisky sling as described above and stand pivot back to w/c using RW with CGA for steadying. Tnsported back to room and pt left seated in w/c with needs in reach and seat belt alarm on. ra Therapy Documentation Precautions:  Precautions Precautions: Fall Precaution Comments: monitor BP Restrictions Weight Bearing Restrictions: No  Pain: At beginning of session repeatedly denied pain though continues to moan/groan with movement but after performing Nustep activity reported he "hurt all over" with just generalized body aches; declines asking RN for medications.   Therapy/Group: Individual Therapy  Tawana Scale, PT, DPT 04/18/2019, 8:01 AM

## 2019-04-18 NOTE — Progress Notes (Signed)
Speech Language Pathology Weekly Progress and Session Note  Patient Details  Name: Jeremy Sherman MRN: [id number] Date of Birth: 1950/12/16  Beginning of progress report period: April 10, 2019 End of progress report period: April 18, 2019  Today's Date: 04/18/2019 SLP Individual Time: 2706-2376 SLP Individual Time Calculation (min): 46 min  Short Term Goals: Week 2: SLP Short Term Goal 1 (Week 2): Pt will consume trials of Dys 3 solids supervision cues for effective mastication and oral clearance X3 prior to upgrade. SLP Short Term Goal 1 - Progress (Week 2): Met SLP Short Term Goal 2 (Week 2): Pt will demonstrate ability to problem solve during functional familiar tasks with Min A verbal/visual cues. SLP Short Term Goal 2 - Progress (Week 2): Progressing toward goal SLP Short Term Goal 3 (Week 2): Pt will selectively attend to tasks for 10 minutes with Min A verbal/verbal cues for redirection. SLP Short Term Goal 3 - Progress (Week 2): Met SLP Short Term Goal 4 (Week 2): Pt will recall new and daily information with Mod A verbal/visual cues for use of compensatory strategies. SLP Short Term Goal 4 - Progress (Week 2): Met SLP Short Term Goal 5 (Week 2): Pt will demonstrate intellectual awareness by stating 1 physical and 1 cognitive deficit with Mod A verbal/visual cues. SLP Short Term Goal 5 - Progress (Week 2): Met    New Short Term Goals: Week 3: SLP Short Term Goal 1 (Week 3): STG=LTG due to remaining LOS  Weekly Progress Updates: Pt has made functional gains and met 4 out of 5 short term goals. Pt is currently Min-Mod assist for due to cognitive impairments primarily impacting intellectual and emergent awareness, short term recall, and basic problem solving at this time. Pt is was upgraded to Dys 3 (mech soft) diet this week, and has continued to consume thin liquids with Supervision A verbal cues for use of swallow precautions. Pt has demonstrated improved use of  external aids for orientation, basic problem solving, and selective attention. Pt education is ongoing and family education with pt's wife is scheduled for 04/22/19. Pt would continue to benefit from skilled ST while inpatient in order to maximize functional independence and reduce burden of care prior to discharge. Anticipate that pt will need 24/7 supervision at discharge in addition to [facility] follow up at next level of care.       Intensity: Minumum of 1-2 x/day, 30 to 90 minutes Frequency: 3 to 5 out of 7 days Duration/Length of Stay: 04/24/19 Treatment/Interventions: Cognitive remediation/compensation;Cueing hierarchy;Dysphagia/aspiration precaution training;Functional tasks;Patient/family education;Internal/external aids   Daily Session  Skilled Therapeutic Interventions: Pt was seen for skilled ST targeting dysphagia and cognitive skills. Pt was asleep with breakfast at bedside upon arrival; although he aroused easily, verbal cues for task initiation and set up of tray required to start eating breakfast. No overt s/sx aspiration observed throughout intake of Dys 3 solids or thin liquids, however intermittent mildly prolonged mastication noted, requiring 2 verbal cues in order to expedite mastication. Pt demonstrated improved ability to complete a familiar 3-step action card sequencing task, requiring only Min A verbal and visual cues for error awareness and problem solving. Pt oriented to place and time with Supervision A for use of aids. In functional conversation regarding progress and current deficits, pt required Mod A verbal cues in order to verbalize he would likely not be able to drive or go back to his job immediately after leaving the hospital, for safety reasons based on current level of  function. Pt left laying in bed with alarm set and needs within reach. Continue per current plan of care.        Pain Pain Assessment Pain Scale: 0-10 Pain Score: 5  Pain Location: Knee Pain  Orientation: Left Pain Descriptors / Indicators: Sore Pain Intervention(s): Medication (See eMAR)  Therapy/Group: Individual Therapy  Arbutus Leas 04/18/2019, 7:25 AM

## 2019-04-18 NOTE — Progress Notes (Signed)
Occupational Therapy Session Note  Patient Details  Name: Jeremy Sherman MRN: [id number] Date of Birth: [birth year]  Today's Date: 04/18/2019 OT Individual Time: [id number] OT Individual Time Calculation (min): 25 min    Short Term Goals: Week 3:  OT Short Term Goal 1 (Week 3): Continue working on established LTgs downgraded to min guard assist overall  Skilled Therapeutic Interventions/Progress Updates:    Pt in wheelchair with eyes closed and decreased attention to start session.  When asked what was wrong he stated "I feel faint.".  He could not elaborate further but BP taken at 165/68 with HR at 61.  When asked how he felt he would just continually state "I don't know" and then close his eyes.  He was also moaning as well.  Pt with one episode of dry heaving multiple times, with nursing notified of situation.  He was given nausea medicine and then he transferred stand pivot to the bed with use of the RW and min assist.  Mod assist for transition to sidelying where he stayed and fell asleep.  Pt missed 45 mins of session.  Call button in reach and bed alarm in place.    Therapy Documentation Precautions:  Precautions Precautions: Fall Precaution Comments: monitor BP Restrictions Weight Bearing Restrictions: No General: General OT Amount of Missed Time: 45 Minutes Vital Signs: Therapy Vitals Pulse Rate: 62 Resp: 18 BP: 131/66 Patient Position (if appropriate): Lying Oxygen Therapy SpO2: 98 % O2 Device: Room Air Pain: Pain Assessment Pain Scale: Faces Pain Score: 0-No pain ADL: See Care Tool Section for some details of mobility and selfcare  Therapy/Group: Individual Therapy  Nancy Arvin OTR/L 04/18/2019, 3:20 PM

## 2019-04-19 ENCOUNTER — Inpatient Hospital Stay (HOSPITAL_COMMUNITY): Payer: BC Managed Care – PPO

## 2019-04-19 ENCOUNTER — Inpatient Hospital Stay (HOSPITAL_COMMUNITY): Payer: BC Managed Care – PPO | Admitting: Occupational Therapy

## 2019-04-19 DIAGNOSIS — E669 Obesity, unspecified: Secondary | ICD-10-CM

## 2019-04-19 DIAGNOSIS — E039 Hypothyroidism, unspecified: Secondary | ICD-10-CM

## 2019-04-19 DIAGNOSIS — E46 Unspecified protein-calorie malnutrition: Secondary | ICD-10-CM

## 2019-04-19 DIAGNOSIS — R7401 Elevation of levels of liver transaminase levels: Secondary | ICD-10-CM

## 2019-04-19 DIAGNOSIS — E1169 Type 2 diabetes mellitus with other specified complication: Secondary | ICD-10-CM

## 2019-04-19 DIAGNOSIS — E8809 Other disorders of plasma-protein metabolism, not elsewhere classified: Secondary | ICD-10-CM

## 2019-04-19 DIAGNOSIS — N401 Enlarged prostate with lower urinary tract symptoms: Secondary | ICD-10-CM

## 2019-04-19 DIAGNOSIS — D62 Acute posthemorrhagic anemia: Secondary | ICD-10-CM

## 2019-04-19 LAB — GLUCOSE, CAPILLARY
Glucose-Capillary: 106 mg/dL — ABNORMAL HIGH (ref 70–99)
Glucose-Capillary: 108 mg/dL — ABNORMAL HIGH (ref 70–99)
Glucose-Capillary: 99 mg/dL (ref 70–99)
Glucose-Capillary: 114 mg/dL — ABNORMAL HIGH (ref 70–99)

## 2019-04-19 MED ORDER — PRO-STAT SUGAR FREE PO LIQD
30.0000 mL | Freq: Two times a day (BID) | ORAL | Status: DC
  Administered 2019-04-19 – 2019-04-24 (×10): 30 mL via ORAL
  Filled 2019-04-19 (×11): qty 30

## 2019-04-19 NOTE — Progress Notes (Signed)
Occupational Therapy Session Note  Patient Details  Name: Jeremy Sherman MRN: [id number] Date of Birth: 18-Jan-1951  Today's Date: 04/19/2019 OT Individual Time: 1300-1330 OT Individual Time Calculation (min): 30 min  and Today's Date: 04/19/2019 OT Missed Time: 30 Minutes Missed Time Reason: Patient ill (comment)(Pt with nausea and vomiting)  Skilled Therapeutic Interventions/Progress Updates:    Pt greeted semi-reclined in bed eating lunch. Pt reported he is a very slow eater and wanted to do therapy, then finish his lunch. OT applied ACE wraps to B LEs and donned socks. Pt declined any bathing/dressing today, but wanted to go for a walk. Pt needed min A to come to sitting EOB. Once at EOB, pt began dry heaving and had episode of emesis into emesis bag. Pt continued throwing up and nursing entered to administer nausea medicine. Pt able to keep meds down, but declined further activity 2/2 nausea. Pt needed mod A to lift B LEs back into bed. OT removed socks and ACE wraps for comfort. Pt left with bed alarm on, call bell in reach, and needs met   Therapy Documentation Precautions:  Precautions Precautions: Fall Precaution Comments: monitor BP Restrictions Weight Bearing Restrictions: No General: General OT Amount of Missed Time: 30 Minutes Pain:  pt denies pain  Therapy/Group: Individual Therapy  Valma Cava 04/19/2019, 1:53 PM

## 2019-04-19 NOTE — Progress Notes (Signed)
[facility] PHYSICAL MEDICINE & REHABILITATION PROGRESS NOTE   Subjective/Complaints: Patient seen sitting up in bed this morning.  He states he slept well overnight.  He denies complaints.  ROS: Denies CP, SOB, N/V/D  Objective:   No results found. No results for input(s): WBC, HGB, HCT, PLT in the last 72 hours. No results for input(s): NA, K, CL, CO2, GLUCOSE, BUN, CREATININE, CALCIUM in the last 72 hours.  Intake/Output Summary (Last 24 hours) at 04/19/2019 1153 Last data filed at 04/19/2019 0900 Gross per 24 hour  Intake 300 ml  Output 925 ml  Net -625 ml     Physical Exam: Vital Signs Blood pressure 136/84, pulse (!) 59, temperature 98.5 F (36.9 C), temperature source Oral, resp. rate 19, height 6\' 5"  (1.956 m), weight 106.9 kg, SpO2 100 %. Constitutional: No distress . Vital signs reviewed. HENT: Normocephalic.  Atraumatic. Eyes: EOMI. No discharge. Cardiovascular: No JVD. Respiratory: Normal effort.  No stridor. GI: Non-distended. Skin: Warm and dry.  Intact. Psych: Normal mood.  Normal behavior. Musc: No edema in extremities.  No tenderness in extremities. Neurologic: Alert Motor: Bilateral upper extremities: 5/5 proximal distal Bilateral lower extremities: 4+/5 proximal to distal  Assessment/Plan: 1. Functional deficits secondary to L ACA aneurysm, MCA  Stenosis causing Left hemispheric ischemia which require 3+ hours per day of interdisciplinary therapy in a comprehensive inpatient rehab setting.  Physiatrist is providing close team supervision and 24 hour management of active medical problems listed below.  Physiatrist and rehab team continue to assess barriers to discharge/monitor patient progress toward functional and medical goals  Care Tool:  Bathing    Body parts bathed by patient: Right arm, Left arm, Chest, Abdomen, Right upper leg, Left upper leg, Face, Front perineal area, Right lower leg, Left lower leg   Body parts bathed by helper:  Buttocks Body parts n/a: Left lower leg, Right lower leg   Bathing assist Assist Level: Minimal Assistance - Patient > 75%     Upper Body Dressing/Undressing Upper body dressing   What is the patient wearing?: Pull over shirt    Upper body assist Assist Level: Supervision/Verbal cueing    Lower Body Dressing/Undressing Lower body dressing      What is the patient wearing?: Pants, Incontinence brief     Lower body assist Assist for lower body dressing: Moderate Assistance - Patient 50 - 74%     Toileting Toileting    Toileting assist Assist for toileting: Maximal Assistance - Patient 25 - 49%     Transfers Chair/bed transfer  Transfers assist     Chair/bed transfer assist level: Contact Guard/Touching assist     Locomotion Ambulation   Ambulation assist   Ambulation activity did not occur: Safety/medical concerns  Assist level: Contact Guard/Touching assist Assistive device: Walker-rolling Max distance: 42ft   Walk 10 feet activity   Assist  Walk 10 feet activity did not occur: Safety/medical concerns  Assist level: Contact Guard/Touching assist Assistive device: Walker-rolling   Walk 50 feet activity   Assist Walk 50 feet with 2 turns activity did not occur: Safety/medical concerns  Assist level: Contact Guard/Touching assist Assistive device: Walker-rolling    Walk 150 feet activity   Assist Walk 150 feet activity did not occur: Safety/medical concerns         Walk 10 feet on uneven surface  activity   Assist Walk 10 feet on uneven surfaces activity did not occur: Safety/medical concerns         Wheelchair  Assist Will patient use wheelchair at discharge?: (TBD)             Wheelchair 50 feet with 2 turns activity    Assist            Wheelchair 150 feet activity     Assist          Blood pressure 136/84, pulse (!) 59, temperature 98.5 F (36.9 C), temperature source Oral, resp. rate 19,  height 6\' 5"  (1.956 m), weight 106.9 kg, SpO2 100 %.    Medical Problem List and Plan:  1. 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 Antithrombotics:  -DVT/anticoagulation: Eliquis  -antiplatelet therapy: Brilinta  3. Pain Management: Tramadol as needed  -pain is well controlled 4. Mood: Provide emotional support  -antipsychotic agents: N/A  5. Neuropsych: This patient is capable of making decisions on his own behalf.  6. Skin/Wound Care: Routine skin checks  7. Fluids/Electrolytes/Nutrition: Routine in and outs 8.  Elevated blood pressure readings. Patient on no home med antihypertensive medications. Monitor with increased mobility  Vitals:   04/18/19 1951 04/19/19 0453  BP: 127/73 136/84  Pulse: 74 (!) 59  Resp: 18 19  Temp: 98.9 F (37.2 C) 98.5 F (36.9 C)  SpO2: 98% 100%   Orthostatics unable to be performed   May ACE wrap legs, no change lying to sitting  Blood pressure controlled on 2/20 9. Hyperlipidemia. Lipitor  10. Tobacco abuse. Counseling  11.  Diabetes mellitus type 2. Hemoglobin A1c 6.4. SSI. Patient on no diabetic agents prior to admission. Well controlled no meds CBG (last 3)  Recent Labs    04/18/19 1152 04/18/19 1742 04/19/19 0614  GLUCAP 122* 96 106*   Relatively controlled on 2/20 12. BPH. Flomax 0.4 mg daily. Urinary retention with painful caths accompanied by hematuria , foley replaced, f/u with urology as outpatient 13. Hypothyroidism. TSH 45.  Synthroid started. Patient will need follow-up thyroid panel in 3 months 14.  Severe PAD with R foot ischemia  15.  Onychogryphosis/mycosis-severe OP DPM f/u as they are not availabe for consults in hospital   16. Left post heel pain, ? achiiles  tendinopathy, prevalon boot and diclofenac gel    ?  Improving 17.  Transaminitis  LFTs elevated on 2/5, labs ordered for Monday 18.  Hypoalbuminemia  Supplement initiated on 2/20 19.  Acute blood loss anemia  Hemoglobin 7.8 on 2/5, labs ordered for Monday 20.  Post stroke dysphagia  D3 thins, advance as tolerated  LOS: 16 days A FACE TO FACE EVALUATION WAS PERFORMED  Jeremy Sherman Jeremy Sherman 04/19/2019, 11:53 AM

## 2019-04-20 ENCOUNTER — Inpatient Hospital Stay (HOSPITAL_COMMUNITY): Payer: BC Managed Care – PPO | Admitting: Speech Pathology

## 2019-04-20 ENCOUNTER — Inpatient Hospital Stay (HOSPITAL_COMMUNITY): Payer: BC Managed Care – PPO

## 2019-04-20 DIAGNOSIS — I671 Cerebral aneurysm, nonruptured: Secondary | ICD-10-CM

## 2019-04-20 DIAGNOSIS — I5032 Chronic diastolic (congestive) heart failure: Secondary | ICD-10-CM

## 2019-04-20 DIAGNOSIS — R7401 Elevation of levels of liver transaminase levels: Secondary | ICD-10-CM

## 2019-04-20 DIAGNOSIS — N401 Enlarged prostate with lower urinary tract symptoms: Secondary | ICD-10-CM

## 2019-04-20 DIAGNOSIS — R338 Other retention of urine: Secondary | ICD-10-CM

## 2019-04-20 DIAGNOSIS — I951 Orthostatic hypotension: Secondary | ICD-10-CM

## 2019-04-20 DIAGNOSIS — E1169 Type 2 diabetes mellitus with other specified complication: Secondary | ICD-10-CM

## 2019-04-20 DIAGNOSIS — E669 Obesity, unspecified: Secondary | ICD-10-CM

## 2019-04-20 LAB — GLUCOSE, CAPILLARY
Glucose-Capillary: 92 mg/dL (ref 70–99)
Glucose-Capillary: 114 mg/dL — ABNORMAL HIGH (ref 70–99)
Glucose-Capillary: 99 mg/dL (ref 70–99)

## 2019-04-20 MED ORDER — SODIUM CHLORIDE 0.9 % IV BOLUS
1000.0000 mL | Freq: Once | INTRAVENOUS | Status: AC
  Administered 2019-04-20: 1000 mL via INTRAVENOUS

## 2019-04-20 NOTE — Plan of Care (Signed)
  Problem: Consults Goal: RH STROKE PATIENT EDUCATION Description: See Patient Education module for education specifics  Outcome: Progressing   Problem: RH BOWEL ELIMINATION Goal: RH STG MANAGE BOWEL WITH ASSISTANCE Description: STG Manage Bowel with mod I Assistance. Outcome: Progressing   Problem: RH BLADDER ELIMINATION Goal: RH STG MANAGE BLADDER WITH ASSISTANCE Description: STG Manage Bladder With MAX Assistance Outcome: Progressing   Problem: RH SKIN INTEGRITY Goal: RH STG MAINTAIN SKIN INTEGRITY WITH ASSISTANCE Description: STG Maintain Skin Integrity With Mod I Assistance. Outcome: Progressing   Problem: RH SAFETY Goal: RH STG ADHERE TO SAFETY PRECAUTIONS W/ASSISTANCE/DEVICE Description: STG Adhere to Safety Precautions With cues and reminders  Outcome: Progressing   Problem: RH PAIN MANAGEMENT Goal: RH STG PAIN MANAGED AT OR BELOW PT'S PAIN GOAL Description: Pain level less that 4 on scale of 0-10 Outcome: Progressing   Problem: RH KNOWLEDGE DEFICIT Goal: RH STG INCREASE KNOWLEDGE OF HYPERTENSION Description: Pt will be able to adhere to medication regimen, dietary and lifestyle modification to control blood glucose and hyperlipidemia with mod I assist upon discharge.  Outcome: Progressing Goal: RH STG INCREASE KNOWLEGDE OF HYPERLIPIDEMIA Description: Pt will be able to adhere to medication regimen, dietary and lifestyle modification to control blood glucose and hyperlipidemia with mod I assist upon discharge.  Outcome: Progressing Goal: RH STG INCREASE KNOWLEDGE OF STROKE PROPHYLAXIS Description: Pt will be able to adhere to medication regimen, dietary and lifestyle modification to control blood glucose and hyperlipidemia with mod I assist upon discharge.  Outcome: Progressing

## 2019-04-20 NOTE — Progress Notes (Signed)
Patient alert and oriented x4, flat affect. Patient appetite very poor-eating 50% at most. Only able to push fluid intake to approximately 500cc today. Urine remains tea colored/cloudy. Patient nauseous and vomited x1 yesterday. No complaints of that today. LBM documented 2/18. Sorbitol PRN given this morning. No results at this time. Informed by therapy that patient symptomatic (orthostatic) with position changes. MD Posey Pronto notified of patient's condition. Order received for abdominal binder and NS bolus. IV placed in Left upper arm with fluids started. Patient resting comfortably in bed at this time. Ultram 50mg  given for left leg pain with some relief. Continue to monitor.

## 2019-04-20 NOTE — Progress Notes (Signed)
Patient out of bed in wheelchair.  Caryl Pina, RN at bedside.  Initial assessment found no visible sites.  Patient has therapy at 1300 and will get back in bed at that time.  Caryl Pina, RN to re enter consult once patient has returned to bed.

## 2019-04-20 NOTE — Progress Notes (Signed)
Pt had 348ml tea/amber colored urine output for shift. Encourage pt to drink water throughout shift and complied by taking sips but wouldn't tolerate much more (about 269ml intake of water). Foley assessed and cleaned, CHG bath given, no signs of dependent loops or kinks and is completely secured to right leg with securing device.

## 2019-04-20 NOTE — Progress Notes (Signed)
[facility] PHYSICAL MEDICINE & REHABILITATION PROGRESS NOTE   Subjective/Complaints: Patient seen sitting up in bed this morning.  He states he is Well overnight.  Per nursing, patient had nausea yesterday, however he denies nausea this a.m.  Later informed by nursing regarding symptomatic orthostasis and patient's limited fluid intake-we will continue to attempt to encourage to drink fluids and abdominal binder.  ROS: Denies CP, SOB, N/V/D  Objective:   No results found. No results for input(s): WBC, HGB, HCT, PLT in the last 72 hours. No results for input(s): NA, K, CL, CO2, GLUCOSE, BUN, CREATININE, CALCIUM in the last 72 hours.  Intake/Output Summary (Last 24 hours) at 04/20/2019 1228 Last data filed at 04/20/2019 1215 Gross per 24 hour  Intake 680 ml  Output 450 ml  Net 230 ml     Physical Exam: Vital Signs Blood pressure 118/61, pulse 69, temperature 98.6 F (37 C), temperature source Oral, resp. rate 18, height 6\' 5"  (1.956 m), weight 100.5 kg, SpO2 97 %. Constitutional: No distress . Vital signs reviewed. HENT: Normocephalic.  Atraumatic. Eyes: EOMI. No discharge. Cardiovascular: No JVD. Respiratory: Normal effort.  No stridor. GI: Non-distended. Skin: Warm and dry.  Intact. Psych: Normal mood.  Normal behavior. Musc: No edema in extremities.  No tenderness in extremities. Neurologic: Alert Motor: Bilateral upper extremities: 5/5 proximal distal Bilateral lower extremities: 4+/5 proximal to distal, unchanged  Assessment/Plan: 1. Functional deficits secondary to L ACA aneurysm, MCA  Stenosis causing Left hemispheric ischemia which require 3+ hours per day of interdisciplinary therapy in a comprehensive inpatient rehab setting.  Physiatrist is providing close team supervision and 24 hour management of active medical problems listed below.  Physiatrist and rehab team continue to assess barriers to discharge/monitor patient progress toward functional and medical  goals  Care Tool:  Bathing    Body parts bathed by patient: Right arm, Left arm, Chest, Abdomen, Right upper leg, Left upper leg, Face, Front perineal area, Right lower leg, Left lower leg   Body parts bathed by helper: Buttocks Body parts n/a: Left lower leg, Right lower leg   Bathing assist Assist Level: Minimal Assistance - Patient > 75%     Upper Body Dressing/Undressing Upper body dressing   What is the patient wearing?: Pull over shirt    Upper body assist Assist Level: Supervision/Verbal cueing    Lower Body Dressing/Undressing Lower body dressing      What is the patient wearing?: Pants, Incontinence brief     Lower body assist Assist for lower body dressing: Moderate Assistance - Patient 50 - 74%     Toileting Toileting    Toileting assist Assist for toileting: Maximal Assistance - Patient 25 - 49%     Transfers Chair/bed transfer  Transfers assist     Chair/bed transfer assist level: Contact Guard/Touching assist     Locomotion Ambulation   Ambulation assist   Ambulation activity did not occur: Safety/medical concerns  Assist level: Contact Guard/Touching assist Assistive device: Walker-rolling Max distance: 30ft   Walk 10 feet activity   Assist  Walk 10 feet activity did not occur: Safety/medical concerns  Assist level: Contact Guard/Touching assist Assistive device: Walker-rolling   Walk 50 feet activity   Assist Walk 50 feet with 2 turns activity did not occur: Safety/medical concerns  Assist level: Contact Guard/Touching assist Assistive device: Walker-rolling    Walk 150 feet activity   Assist Walk 150 feet activity did not occur: Safety/medical concerns         Walk  10 feet on uneven surface  activity   Assist Walk 10 feet on uneven surfaces activity did not occur: Safety/medical concerns         Wheelchair     Assist Will patient use wheelchair at discharge?: (TBD)             Wheelchair 50  feet with 2 turns activity    Assist            Wheelchair 150 feet activity     Assist          Blood pressure 118/61, pulse 69, temperature 98.6 F (37 C), temperature source Oral, resp. rate 18, height 6\' 5"  (1.956 m), weight 100.5 kg, SpO2 97 %.    Medical Problem List and Plan:  1. 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 Antithrombotics:  -DVT/anticoagulation: Eliquis  -antiplatelet therapy: Brilinta  3. Pain Management: Tramadol as needed   Controlled on 2/21 4. Mood: Provide emotional support  -antipsychotic agents: N/A  5. Neuropsych: This patient is capable of making decisions on his own behalf.  6. Skin/Wound Care: Routine skin checks  7. Fluids/Electrolytes/Nutrition: Routine in and outs 8.  Elevated blood pressure readings. Patient on no home med antihypertensive medications. Monitor with increased mobility  Vitals:   04/19/19 2000 04/20/19 0522  BP: 138/61 118/61  Pulse: 63 69  Resp: 18 18  Temp: 98.4 F (36.9 C) 98.6 F (37 C)  SpO2: 100% 97%   Symptomatic orthostasis on 2/21 when standing  Ace wraps, abdominal binder  Encourage p.o. fluids  Echocardiogram from 02/2019 reviewed, with ejection fraction of 50 to [id number], grade 1 diastolic dysfunction -we will give fluid bolus today  BMP ordered for tomorrow 9. Hyperlipidemia. Lipitor  10. Tobacco abuse. Counseling  11.  Diabetes mellitus type 2. Hemoglobin A1c 6.4. SSI. Patient on no diabetic agents prior to admission. Well controlled no meds CBG (last 3)  Recent Labs    04/19/19 2057 04/20/19 0622 04/20/19 1158  GLUCAP 114* 92 114*   Relatively controlled on 2/21 12. BPH. Flomax 0.4 mg daily. Urinary retention with painful caths  accompanied by hematuria , foley replaced, f/u with urology as outpatient 13. Hypothyroidism. TSH 45.  Synthroid started. Patient will need follow-up thyroid panel in 3 months 14.  Severe PAD with R foot ischemia  15.  Onychogryphosis/mycosis-severe OP DPM f/u as they are not availabe for consults in hospital   16. Left post heel pain, ? achiiles tendinopathy, prevalon boot and diclofenac gel    ?  Improving 17.  Transaminitis  LFTs elevated on 2/5, labs ordered for tomorrow 18.  Hypoalbuminemia  Supplement initiated on 2/20 19.  Acute blood loss anemia  Hemoglobin 7.8 on 2/5, labs ordered for tomorrow 20.  Post stroke dysphagia  D3 thins, advance as tolerated  LOS: 17 days A FACE TO FACE EVALUATION WAS PERFORMED  Daryus Sowash Lorie Phenix 04/20/2019, 12:28 PM

## 2019-04-20 NOTE — Progress Notes (Signed)
Orthopedic Tech Progress Note Patient Details:  Jeremy Sherman 01-29-1951 [id number]  Ortho Devices Type of Ortho Device: Abdominal binder Ortho Device/Splint Interventions: Application   Post Interventions Patient Tolerated: Well Instructions Provided: Care of device   [place] Pink 04/20/2019, 12:13 PM

## 2019-04-20 NOTE — Progress Notes (Signed)
Speech Language Pathology Daily Session Note  Patient Details  Name: Jeremy Sherman MRN: [id number] Date of Birth: Jul 22, 1950  Today's Date: 04/20/2019 SLP Individual Time: 1300-1330 SLP Individual Time Calculation (min): 30 min  Short Term Goals: Week 3: SLP Short Term Goal 1 (Week 3): STG=LTG due to remaining LOS  Skilled Therapeutic Interventions:  Pt was seen for skilled ST targeting cognitive and dysphagia goals.  Upon arrival, pt was eating lunch.  Per RN report, pt has not been eating or drinking much this weekend and has been limited in therapies by symptomatic orthostasis.  Awaiting IV team after therapy to establish IV access for fluids.  Pt was encouraged to eat more of his lunch meal but ultimately he only ate ~50% of his meal and a few sips of his orange juice while therapist was present.  Pt consumed dys 3 textures and thin liquids with mod I use of swallowing precautions and grossly WFL oral phase.  No overt s/s of aspiration were evident with limited presentations but pt declined further POs or trials of advanced textures.  Overall, pt's affect was flat and he appeared disengaged when therapist attempted to converse with him.  Pt reported he did not feel like working in therapy today and that he wanted to close his eyes and rest.  SLP provided skilled education regarding participation in therapies, including maximizing his functional independence prior to discharge.  SLP offered pt a variety of activities in the hopes of encouraging pt to participate in therapies; however, he continued to politely decline.  As a result, therapist ended session and communicated with nursing so that pt could be transferred back to bed for IV nurse.  Continue per current plan of care.    Pain Pain Assessment Pain Scale: 0-10 Pain Score: 0-No pain  Therapy/Group: Individual Therapy  Jahsir Rama, Selinda Orion 04/20/2019, 1:48 PM

## 2019-04-20 NOTE — Progress Notes (Addendum)
Physical Therapy Session Note  Patient Details  Name: Jeremy Sherman MRN: [id number] Date of Birth: 08-22-1950  Today's Date: 04/20/2019 PT Individual Time: [id number] and 1400-1424 PT Individual Time Calculation (min): 54 min and 24 min    Short Term Goals: Week 3:  PT Short Term Goal 1 (Week 3): = to LTGs based on ELOS  Skilled Therapeutic Interventions/Progress Updates:    Session 1: Patient supine in bed upon PT arrival, agreeable to therapy tx, denies pain. Therapist donned B LE ace wraps, socks, and pants total A with pt performing bridging and intermittent LE raises to allow for LE threading. Supine > sitting EOB with CGA-minA for LE management and positioning, pt reported feeling light headed and wanting to lay back down. Assess orthostatics vitals with pt presenting in supine BP 159/71 mmHg and HR 69 bpm and in sitting BP 126/93 mmHg and HR 72 bpm. Sitting > supine with minA, continued to assess vitals. Therapist encouraged pt to drink water throughout session. Pt appeared confused asking "Where are we?". Therapist oriented pt to hospital location and when asked minutes later understood where he was. He was orientated to time regarding month and year however required cueing for day even after telling the pt it was Sunday as he reported minutes later it being Friday. Supine > sitting EOB > lateral scoot to recliner with minA for hip scooting and LE management, cues for position. Pt appeared significantly fatigued and he reported keeping his eyes closed helps with the light headedness. Once in recliner with LE elevated, assessed vitals again presenting back to baseline at BP 148/57 and HR 51 bpm. Therapist communicated orthostatic BP readings from session to RN and discussed ordering an abdominal binder for home d/t integrity of feet and to dec caregiver burden. Pt left in recliner with needs in reach and chair alarm set.    Session 2: Patient supine in bed upon PT arrival,  agreeable to therapy tx, denies pain. Pt stated feeling a little better than this morning. Second session d/t making up previously missed time. Therapist re-wrapped one of the pt's LE ace wrappings d/t coming off total A. Therapist donned abdominal binder total A while pt came up to long sitting with supervision. Sit > supine with CGA-minA for LE management. Static sitting balance EOB with supervision to assess vitals. Sit > stand at [facility] with minA for trunk upright and safety d/t feelings of light headedness. Stand > sitting EOB > supine with minA for LE management and cues for positioning in bed. Pt reported feeling nauseous and feeling better if he kept his eyes closed, therapist noted no signs of nystagmus with positional movements however not formally tested d/t pt primary c/o light headedness and evidence by vitals.   Orthostatic vitals as follows:  Supine - BP 163/76 HR 66 Sitting EOB 0 minutes - BP 143/76 HR 79 Sitting EOB 3 minutes - BP 160/65 HR 59 Standing 0 minutes - BP 128/70 HR 85  Pt left in bed with needs in reach and bed alarm set.   Therapy Documentation Precautions:  Precautions Precautions: Fall Precaution Comments: monitor BP Restrictions Weight Bearing Restrictions: No    Therapy/Group: Individual Therapy  Juliann Pulse SPT 04/20/2019, 7:34 AM

## 2019-04-21 ENCOUNTER — Inpatient Hospital Stay (HOSPITAL_COMMUNITY): Payer: BC Managed Care – PPO | Admitting: Occupational Therapy

## 2019-04-21 ENCOUNTER — Inpatient Hospital Stay (HOSPITAL_COMMUNITY): Payer: BC Managed Care – PPO

## 2019-04-21 ENCOUNTER — Inpatient Hospital Stay (HOSPITAL_COMMUNITY): Payer: BC Managed Care – PPO | Admitting: Speech Pathology

## 2019-04-21 LAB — COMPREHENSIVE METABOLIC PANEL
ALT: 14 U/L (ref 0–44)
AST: 15 U/L (ref 15–41)
Albumin: 2.5 g/dL — ABNORMAL LOW (ref 3.5–5.0)
Alkaline Phosphatase: 85 U/L (ref 38–126)
Anion gap: 9 (ref 5–15)
BUN: 12 mg/dL (ref 8–23)
CO2: 24 mmol/L (ref 22–32)
Calcium: 8.6 mg/dL — ABNORMAL LOW (ref 8.9–10.3)
Chloride: 104 mmol/L (ref 98–111)
Creatinine, Ser: 1.08 mg/dL (ref 0.61–1.24)
GFR calc Af Amer: >60 mL/min (ref >60)
GFR calc non Af Amer: >60 mL/min (ref >60)
Glucose, Bld: 101 mg/dL — ABNORMAL HIGH (ref 70–99)
Potassium: 3.8 mmol/L (ref 3.5–5.1)
Sodium: 137 mmol/L (ref 135–145)
Total Bilirubin: 1.1 mg/dL (ref 0.3–1.2)
Total Protein: 6.7 g/dL (ref 6.5–8.1)

## 2019-04-21 LAB — CBC WITH DIFFERENTIAL/PLATELET
Abs Immature Granulocytes: 0.02 10*3/uL (ref 0.00–0.07)
Basophils Absolute: 0.1 10*3/uL (ref 0.0–0.1)
Basophils Relative: 1 %
Eosinophils Absolute: 0.1 10*3/uL (ref 0.0–0.5)
Eosinophils Relative: 3 %
HCT: 27.7 % — ABNORMAL LOW (ref 39.0–52.0)
Hemoglobin: 8.7 g/dL — ABNORMAL LOW (ref 13.0–17.0)
Immature Granulocytes: 0 %
Lymphocytes Relative: 18 %
Lymphs Abs: 0.9 10*3/uL (ref 0.7–4.0)
MCH: 29.6 pg (ref 26.0–34.0)
MCHC: 31.4 g/dL (ref 30.0–36.0)
MCV: 94.2 fL (ref 80.0–100.0)
Monocytes Absolute: 0.4 10*3/uL (ref 0.1–1.0)
Monocytes Relative: 9 %
Neutro Abs: 3.3 10*3/uL (ref 1.7–7.7)
Neutrophils Relative %: 69 %
Platelets: 346 10*3/uL (ref 150–400)
RBC: 2.94 MIL/uL — ABNORMAL LOW (ref 4.22–5.81)
RDW: 15.7 % — ABNORMAL HIGH (ref 11.5–15.5)
WBC: 4.8 10*3/uL (ref 4.0–10.5)
nRBC: 0 % (ref 0.0–0.2)

## 2019-04-21 LAB — GLUCOSE, CAPILLARY
Glucose-Capillary: 88 mg/dL (ref 70–99)
Glucose-Capillary: 96 mg/dL (ref 70–99)

## 2019-04-21 NOTE — Plan of Care (Signed)
  Problem: RH Problem Solving Goal: LTG Patient will demonstrate problem solving for (SLP) Description: LTG:  Patient will demonstrate problem solving for basic/complex daily situations with cues  (SLP) Flowsheets (Taken 04/21/2019 1019) LTG Patient will demonstrate problem solving for: Minimal Assistance - Patient > 75% Goal downgraded due to slower than expected progress

## 2019-04-21 NOTE — Progress Notes (Signed)
Speech Language Pathology Daily Session Note  Patient Details  Name: Jeremy Sherman MRN: [id number] Date of Birth: 1950-03-18  Today's Date: 04/21/2019 SLP Individual Time: [id number] SLP Individual Time Calculation (min): 44 min  Short Term Goals: Week 3: SLP Short Term Goal 1 (Week 3): STG=LTG due to remaining LOS  Skilled Therapeutic Interventions: Pt was seen for skilled ST targeting cognitive goals. SLP facilitated session with overall Min A verbal and visual cues for error awareness and problem solving corrections during pt's creation of basic and mildly complex PEG board designs. Pt with much improved ability to detect errors today in comparison to previous sessions. Pt continues to report lack of appetite and refused breakfast with SLP this morning. SLP encouraged pt to attempt at end of session and set up his tray for him. Pt left in bed with alarm set. Continue per current plan of care.        Pain Pain Assessment Pain Scale: 0-10 Pain Score: 0-No pain  Therapy/Group: Individual Therapy  Arbutus Leas 04/21/2019, 8:10 AM

## 2019-04-21 NOTE — Progress Notes (Addendum)
PHYSICAL MEDICINE & REHABILITATION PROGRESS NOTE   Subjective/Complaints:  Problem with orthostasis again, Flomax restarted last week   ROS: Denies CP, SOB, N/V/D  Objective:   No results found. No results for input(s): WBC, HGB, HCT, PLT in the last 72 hours. Recent Labs    04/21/19 0550  NA 137  K 3.8  CL 104  CO2 24  GLUCOSE 101*  BUN 12  CREATININE 1.08  CALCIUM 8.6*    Intake/Output Summary (Last 24 hours) at 04/21/2019 0816 Last data filed at 04/21/2019 0522 Gross per 24 hour  Intake 1600 ml  Output 950 ml  Net 650 ml     Physical Exam: Vital Signs Blood pressure (!) 147/84, pulse 67, temperature 97.6 F (36.4 C), temperature source Oral, resp. rate 18, height 6\' 5"  (1.956 m), weight 100.5 kg, SpO2 98 %.  Constitutional: No distress . Vital signs reviewed. HENT: Normocephalic.  Atraumatic. Eyes: EOMI. No discharge. Cardiovascular: No JVD.RRR no murmur  Respiratory: Normal effort.  No stridor.No rales or rhonchi  GI: Non-distended.+ BS soft , NT  Skin: Warm and dry.  Intact. Psych: Normal mood.  Normal behavior. Musc: No edema in extremities.  No tenderness in extremities. Neurologic: Alert Motor: Bilateral upper extremities: 5/5 proximal distal Bilateral lower extremities: 4+/5 proximal to distal, unchanged Assessment/Plan: 1. Functional deficits secondary to L ACA aneurysm, MCA  Stenosis causing Left hemispheric ischemia which require 3+ hours per day of interdisciplinary therapy in a comprehensive inpatient rehab setting.  Physiatrist is providing close team supervision and 24 hour management of active medical problems listed below.  Physiatrist and rehab team continue to assess barriers to discharge/monitor patient progress toward functional and medical goals  Care Tool:  Bathing    Body parts bathed by patient: Right arm, Left arm, Chest, Abdomen, Right upper leg, Left upper leg, Face, Front perineal area, Right lower leg, Left lower  leg   Body parts bathed by helper: Buttocks Body parts n/a: Left lower leg, Right lower leg   Bathing assist Assist Level: Minimal Assistance - Patient > 75%     Upper Body Dressing/Undressing Upper body dressing   What is the patient wearing?: Pull over shirt    Upper body assist Assist Level: Supervision/Verbal cueing    Lower Body Dressing/Undressing Lower body dressing      What is the patient wearing?: Pants, Incontinence brief     Lower body assist Assist for lower body dressing: Moderate Assistance - Patient 50 - 74%     Toileting Toileting    Toileting assist Assist for toileting: Maximal Assistance - Patient 25 - 49%     Transfers Chair/bed transfer  Transfers assist     Chair/bed transfer assist level: Minimal Assistance - Patient > 75%     Locomotion Ambulation   Ambulation assist   Ambulation activity did not occur: Safety/medical concerns  Assist level: Contact Guard/Touching assist Assistive device: Walker-rolling Max distance: 2ft   Walk 10 feet activity   Assist  Walk 10 feet activity did not occur: Safety/medical concerns  Assist level: Contact Guard/Touching assist Assistive device: Walker-rolling   Walk 50 feet activity   Assist Walk 50 feet with 2 turns activity did not occur: Safety/medical concerns  Assist level: Contact Guard/Touching assist Assistive device: Walker-rolling    Walk 150 feet activity   Assist Walk 150 feet activity did not occur: Safety/medical concerns         Walk 10 feet on uneven surface  activity   Assist Walk  10 feet on uneven surfaces activity did not occur: Safety/medical concerns         Wheelchair     Assist Will patient use wheelchair at discharge?: (TBD)             Wheelchair 50 feet with 2 turns activity    Assist            Wheelchair 150 feet activity     Assist          Blood pressure (!) 147/84, pulse 67, temperature 97.6 F (36.4 C),  temperature source Oral, resp. rate 18, height 6\' 5"  (1.956 m), weight 100.5 kg, SpO2 98 %.    Medical Problem List and Plan:  1. 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 Antithrombotics:  -DVT/anticoagulation: Eliquis  -antiplatelet therapy: Brilinta  3. Pain Management: Tramadol as needed   Controlled on 2/21 4. Mood: Provide emotional support  -antipsychotic agents: N/A  5. Neuropsych: This patient is capable of making decisions on his own behalf.  6. Skin/Wound Care: Routine skin checks  7. Fluids/Electrolytes/Nutrition: Routine in and outs 8.  Elevated blood pressure readings. Patient on no home med antihypertensive medications. Monitor with increased mobility  Vitals:   04/20/19 2005 04/21/19 0516  BP: (!) 158/67 (!) 147/84  Pulse: 69 67  Resp: 19 18  Temp: 98.4 F (36.9 C) 97.6 F (36.4 C)  SpO2: 100% 98%   BP ok at rest but orthostatic  9. Hyperlipidemia. Lipitor  10. Tobacco abuse. Counseling  11.  Diabetes mellitus type 2. Hemoglobin A1c 6.4. SSI. Patient on no diabetic agents prior to admission. Well controlled no meds CBG (last 3)  Recent Labs    04/20/19 1158 04/20/19 1645 04/21/19 0621  GLUCAP 114* 99 88   Relatively controlled on 2/22 12. BPH. Flomax 0.4 mg daily will d/c again due to orthostasis . Urinary retention with painful caths accompanied by hematuria , foley replaced, f/u with urology as outpatient 13. Hypothyroidism. TSH 45.  Synthroid started. Patient will need follow-up thyroid panel in 3 months 14.  Severe PAD with R foot ischemia  15.  Onychogryphosis/mycosis-severe OP DPM f/u as they are not availabe for consults in hospital   16. Left post heel pain, ? achiiles  tendinopathy, prevalon boot and diclofenac gel    ?  Improving 17.  Transaminitis  LFTs elevated on 2/5, labs ordered for tomorrow 18.  Hypoalbuminemia  Supplement initiated on 2/20 19.  Acute blood loss anemia  Hemoglobin 7.8 on 2/5, labs ordered for tomorrow 20.  Post stroke dysphagia  D3 thins, advance as tolerated  LOS: 18 days A FACE TO FACE EVALUATION WAS PERFORMED  Charlett Blake 04/21/2019, 8:16 AM

## 2019-04-21 NOTE — Progress Notes (Addendum)
Physical Therapy Session Note  Patient Details  Name: Jeremy Sherman MRN: [id number] Date of Birth: 05-Aug-1950  Today's Date: 04/21/2019 PT Individual Time: 1002-1100 PT Individual Time Calculation (min): 58 min   Short Term Goals: Week 3:  PT Short Term Goal 1 (Week 3): = to LTGs based on ELOS  Skilled Therapeutic Interventions/Progress Updates:   Patient supine in bed upon PT arrival, agreeable to therapy tx with encouragement, denies pain. Therapist donned BLE ace wraps, socks and abdominal binder TotalA, and pants maxA for LE threading, pt performed bridging with min hip clearance to allow therapist to donn pants completely. Therapist assessed vitals in supine with HOB elevated: BP 166/84 HR 61. Supine>sitting EOB with CGA and cues for sequencing. Vitals in sitting: BP 139/78 HR 64 pt reported feeling "okay". Rested for 1-2 minutes, pt performed stand pivot transfer  to w/c with CGA for safety and tactile cue for upright posture, pt required additional time to come to trunk upright but did not require assist initially. Transported in w/c to rehab gym for time management. Sit>stand at [facility] with CGA and cues for hand placement prior to standing, pt ambulated 38' with minA and cues for upright posture. Attempted to perform stair negotiation however once pt in front of steps stated "I need to sit" and required minA to sit for control.Stand pivot transfer from chair > w/c with minA for trunk upright, once standing modA d/t pt LOB R laterally in order to steady, modA for side stepping and cues for sequencing to get into w/c.  Vitals: BP 101/81 HR 62 (after gait and standing activities) waited 3 minutes and assessed again: BP 163/77 HR 57. Pt transported back to room in w/c. Lateral scooting from w/c > recliner with minA for positioning and cues for sequencing. Pt appeared confused today - therapist told him he was going to the gym, within 60s asked where he was going, therapist re-oriented pt and  he questioned her. Pt also required significantly more cueing in regards to sequencing throughout session compared to previous sessions.  Pt left in recliner with needs in reach and chair alarm set.      Therapy Documentation Precautions:  Precautions Precautions: Fall Precaution Comments: monitor BP Restrictions Weight Bearing Restrictions: No    Therapy/Group: Individual Therapy  Netta Corrigan, PT, DPT, CSRS 04/21/19  1:21 PM   Juliann Pulse SPT 04/21/2019, 7:51 AM

## 2019-04-21 NOTE — Progress Notes (Signed)
Patient resting at interval throughout shift, IVF infused per orders and tolerated w/o difficulty. Medicated x1 with relief  Verbalized. Monitor and assisted, repositioned prn, Foley patent Call bell within reach, bed alarm on

## 2019-04-21 NOTE — Progress Notes (Signed)
Occupational Therapy Session Note  Patient Details  Name: Jeremy Sherman MRN: [id number] Date of Birth: 02-Mar-1950  Today's Date: 04/21/2019 OT Individual Time: 9211-9417 OT Individual Time Calculation (min): 10 min  20 missed minutes secondary to nausea  Short Term Goals: Week 2:  OT Short Term Goal 1 (Week 2): Pt will complete LB bathing sit to stand with AE and min instructional cueing. OT Short Term Goal 1 - Progress (Week 2): Met OT Short Term Goal 2 (Week 2): Pt will complete LB dressing sit to stand with mod assist and AE PRN. OT Short Term Goal 2 - Progress (Week 2): Met OT Short Term Goal 3 (Week 2): Pt will complete toilet transfer with min assist using the RW for support. OT Short Term Goal 3 - Progress (Week 2): Met OT Short Term Goal 4 (Week 2): Pt will complete walk-in shower transfer with min assist and use of the RW for support. OT Short Term Goal 4 - Progress (Week 2): Met  Skilled Therapeutic Interventions/Progress Updates:    Upon entering the room, pt seated in recliner chair with and keeps eyes closed while talking with therapist. Pt verbalized not feeling well and feeling very nauseated. Emesis bag is in his hand. BP taken with results of 155/87. OT attempts to encourage but pt continues to decline. OT repositioned pt in chair and chair belt alarm activated. Call bell and all needs within reach upon exiting the room.   Therapy Documentation Precautions:  Precautions Precautions: Fall Precaution Comments: monitor BP Restrictions Weight Bearing Restrictions: No General: General OT Amount of Missed Time: 20 Minutes Vital Signs:  Pain: Pain Assessment Pain Scale: 0-10 Pain Score: 0-No pain ADL: ADL Eating: Set up Where Assessed-Eating: Bed level Grooming: Supervision/safety Where Assessed-Grooming: Edge of bed Upper Body Bathing: Supervision/safety Where Assessed-Upper Body Bathing: Edge of bed Lower Body Bathing: Moderate assistance Where  Assessed-Lower Body Bathing: Edge of bed Upper Body Dressing: Maximal assistance Where Assessed-Upper Body Dressing: Bed level Lower Body Dressing: Maximal assistance Where Assessed-Lower Body Dressing: Edge of bed   Therapy/Group: Individual Therapy  Gypsy Decant 04/21/2019, 12:12 PM

## 2019-04-21 NOTE — Progress Notes (Signed)
Occupational Therapy Session Note  Patient Details  Name: Jeremy Sherman MRN: [id number] Date of Birth: 1950-10-19  Today's Date: 04/21/2019 OT Individual Time: [id number] OT Individual Time Calculation (min): 71 min    Short Term Goals: Week 3:  OT Short Term Goal 1 (Week 3): Continue working on established LTgs downgraded to min guard assist overall  Skilled Therapeutic Interventions/Progress Updates:    Pt sitting in the recliner to start session, working on eating his lunch.  BP taken in sitting position at 160/83, with pt reporting no symptoms of nausea or light headedness.  He was agreeable to participating in therapy.  He completed functional mobility from the bedside recliner to the wheelchair with mod assist for initial sit to stand and min assist for mobility with the RW.  Once in the wheelchair, he was taken down to the tub shower room where he completed a tub transfer with overall mod assist.  Transfer into the tub was completed at min assist, but he needed mod assist for transfer out, secondary to the height of the shower bench being lower.  He transitioned next to the dayroom where he worked on sit to stand from the wheelchair as well as standing balance while engaged in [employer] activity.  He was only able to tolerate standing for 1-2 mins before stating he needed to sit secondary to fatigue and feeling "dizzy".  BP in sitting 145/69 and 138/70.  In standing the lone BP that was readable was 130/61.  Returned to room at end of session with transfer from the wheelchair to the recliner with min assist using the RW.  Pt was left up in the chair working on finishing his lunch.  Regarding orientation this session, pt was oriented to day of the week only but not month or day of the month.    Therapy Documentation Precautions:  Precautions Precautions: Fall Precaution Comments: monitor BP Restrictions Weight Bearing Restrictions: No  Vital Signs: Therapy Vitals Temp: 98  F (36.7 C) Pulse Rate: 75 Resp: 16 BP: (!) 162/93 Patient Position (if appropriate): Sitting Oxygen Therapy SpO2: 99 % O2 Device: Room Air Pain: Pain Assessment Pain Scale: Faces Pain Score: 0-No pain ADL: See Care Tool Section for some details of mobility and selfcare  Therapy/Group: Individual Therapy  Aurielle Slingerland OTR/L 04/21/2019, 3:52 PM

## 2019-04-22 ENCOUNTER — Encounter (HOSPITAL_COMMUNITY): Payer: BC Managed Care – PPO | Admitting: Speech Pathology

## 2019-04-22 ENCOUNTER — Inpatient Hospital Stay (HOSPITAL_COMMUNITY): Payer: BC Managed Care – PPO

## 2019-04-22 ENCOUNTER — Encounter (HOSPITAL_COMMUNITY): Payer: BC Managed Care – PPO | Admitting: Occupational Therapy

## 2019-04-22 ENCOUNTER — Ambulatory Visit (HOSPITAL_COMMUNITY): Payer: BC Managed Care – PPO

## 2019-04-22 LAB — GLUCOSE, CAPILLARY
Glucose-Capillary: 91 mg/dL (ref 70–99)
Glucose-Capillary: 86 mg/dL (ref 70–99)

## 2019-04-22 MED ORDER — POLYETHYLENE GLYCOL 3350 17 G PO PACK
17.0000 g | PACK | Freq: Every day | ORAL | Status: DC
  Administered 2019-04-22 – 2019-04-24 (×3): 17 g via ORAL
  Filled 2019-04-22 (×3): qty 1

## 2019-04-22 MED ORDER — SENNA 8.6 MG PO TABS
1.0000 | ORAL_TABLET | Freq: Every day | ORAL | Status: DC
  Administered 2019-04-22 – 2019-04-23 (×2): 8.6 mg via ORAL
  Filled 2019-04-22 (×2): qty 1

## 2019-04-22 NOTE — Progress Notes (Signed)
Occupational Therapy Session Note  Patient Details  Name: Jeremy Sherman MRN: [id number] Date of Birth: 1950-06-08  Today's Date: 04/22/2019 OT Individual Time: [id number] OT Individual Time Calculation (min): 48 min    Short Term Goals: Week 3:  OT Short Term Goal 1 (Week 3): Continue working on established LTgs downgraded to min guard assist overall  Skilled Therapeutic Interventions/Progress Updates:    Pt in wheelchair with spouse present for family education.  Discussed pt's current level of assist for bathing and dressing tasks, as well as his use of AE.  His spouse reports having a reacher that he can use at home.  Educated her on the reason for him having his LEs wrapped with ace bandages as well as for need for the abdominal binder.   Educated her on how to fasten and unfasten the abdominal binder but did not practice doffing and donning the ace bandages secondary to limited time.  Therapist took pt down to the tub shower room and educated pt's spouse on completion of tub transfers as well as toilet transfers.  Discussed the need for a tub bench, which we will be ordering for him at discharge.  He was able to complete transfer into the tub with min assist for sit to stand from the wheelchair and mobility and mod assist for bringing LEs over the edge of the tub.  He then needed mod assist for sit to stand from the tub bench when transferring out secondary to it being lower.  Therapist discussed not attempting shower tub transfers initially, until therapy comes out or a second person is available to assist with sit to stand.  His spouse voiced agreement.  Had her assist pt with toilet transfer to and from the wheelchair to the 3:1 over the toilet.  He was able to complete with min assist.  Had her utilize gait belt throughout transfer as well.  Finished session with return to the room with safety belt in place and pt sitting up in the wheelchair with call button and phone in reach.   All questions answered.  Therapy Documentation Precautions:  Precautions Precautions: Fall Precaution Comments: monitor BP Restrictions Weight Bearing Restrictions: No   Vital Signs: Therapy Vitals Temp: 98.5 F (36.9 C) Pulse Rate: 70 Resp: 16 BP: 125/77 Patient Position (if appropriate): Sitting Oxygen Therapy SpO2: 99 % O2 Device: Room Air Pain: Pain Assessment Pain Scale: 0-10 Pain Score: 0-No pain ADL: See Care Tool Section for some details of mobility and selfcare  Therapy/Group: Individual Therapy  Janyth Riera OTR/L 04/22/2019, 3:43 PM

## 2019-04-22 NOTE — Progress Notes (Signed)
Patient had small bowel movement on toilet this afternoon with moderate straining. Sorbitol was given this morning. Silvestre Mesi PA notified. New orders received. Continue to monitor.

## 2019-04-22 NOTE — Discharge Summary (Signed)
Physician Discharge Summary  Patient ID: Jeremy Sherman MRN: [id number] DOB/AGE: Dec 16, 1950 69 y.o.  Admit date: 04/03/2019 Discharge date: 04/24/2019  Discharge Diagnoses:  Principal Problem:   Stenosis of right cerebellar artery Active Problems:   Stroke [facility])   CAD (coronary artery disease)   Aneurysm of anterior cerebral artery   Prediabetes   Dysphagia, post-stroke   Thrombus of left atrial appendage   AKI (acute kidney injury) ([place])   Hypoalbuminemia due to protein-calorie malnutrition (HCC)   Acute blood loss anemia   Transaminitis   Diabetes mellitus type 2 in obese (HCC)   Benign prostatic hyperplasia with urinary retention   Hypothyroidism   Orthostatic hypotension   Chronic diastolic congestive heart failure (HCC)   Discharged Condition: Stable  Significant Diagnostic Studies: CT ANGIO HEAD W OR WO CONTRAST  Result Date: 03/26/2019 CLINICAL DATA:  Follow-up examination for acute stroke, recent ICA and MCA stenting and aneurysm coiling EXAM: CT ANGIOGRAPHY HEAD AND NECK CT PERFUSION BRAIN TECHNIQUE: Multidetector CT imaging of the head and neck was performed using the standard protocol during bolus administration of intravenous contrast. Multiplanar CT image reconstructions and MIPs were obtained to evaluate the vascular anatomy. Carotid stenosis measurements (when applicable) are obtained utilizing NASCET criteria, using the distal internal carotid diameter as the denominator. Multiphase CT imaging of the brain was performed following IV bolus contrast injection. Subsequent parametric perfusion maps were calculated using RAPID software. CONTRAST:  162mL OMNIPAQUE IOHEXOL 350 MG/ML SOLN COMPARISON:  Comparison made with prior CTA from 03/19/2019. FINDINGS: CT HEAD FINDINGS Brain: Generalized age-related cerebral atrophy with chronic small vessel ischemic disease. No acute intracranial hemorrhage. No acute large vessel territory infarct. No mass lesion, midline  shift or mass effect. No hydrocephalus. No extra-axial fluid collection. 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 Vascular stent has been placed within the left M1 segment. No hyperdense vessel. Scattered vascular calcifications noted within the carotid siphons. Skull: Scalp soft tissues within normal limits.  Calvarium intact. Sinuses/Orbits: Globes and orbital soft tissues within normal limits. Scattered mucosal thickening noted within the ethmoidal air cells and right maxillary sinus. Few small air-fluid levels noted. Mastoid air cells are clear. Patient is intubated. Other: None. CTA NECK FINDINGS Aortic arch: Visualized aortic arch of normal caliber. Bovine arch with common origin of the right brachiocephalic and left common carotid artery noted. Extensive noncalcified plaque seen throughout the visualized arch and about the origin of the great vessels without hemodynamically significant stenosis. Irregular soft plaque and/or thrombus protruding into the lumen at the origin of the right brachiocephalic artery (series 12, image 38), unchanged. 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 Subclavian arteries otherwise irregular but patent without flow-limiting stenosis. Right carotid system: Right common carotid artery patent from its origin to the bifurcation without stenosis. Mild scattered plaque about the right bifurcation/proximal right ICA without hemodynamically significant stenosis. Right ICA irregular but patent to the skull base without stenosis, dissection, or occlusion. Left carotid system: Multifocal atheromatous irregularity within the left common carotid artery without stenosis. Interval placement of a vascular stent, proximal aspect at the origin of the left ICA. Mild-to-moderate stenoses involving the mid and distal aspect of the stent, measuring up to approximately 50% by NASCET criteria. Widely patent flow otherwise seen through  the stent. No intraluminal thrombus or other complication. Left ICA irregular but otherwise widely patent to the skull base without stenosis, dissection, or occlusion. Vertebral arteries: Both vertebral arteries arise from the subclavian arteries. Vertebral arteries  remain widely patent within the neck without stenosis, dissection or occlusion. Skeleton: No acute osseous abnormality. No discrete osseous lesions. Moderate cervical spondylosis noted at C5-6. Patient is edentulous. Other neck: Endotracheal and enteric tubes in place. Postoperative changes from recent cutdown and open exposure of the left common carotid artery seen at the lower anterior left neck. Percutaneous drain remains in place within this region with associated scattered foci of soft tissue emphysema. Associated postoperative swelling and blood products present within this region as well. Small linear contrast blush within this region adjacent to the surgical drain likely reflects a small amount of persistent bleeding, likely venous in nature (series 5, image 36). No other active contrast extravasation. Upper chest: Layering bilateral pleural effusions with associated atelectasis partially visualized. Paraseptal emphysematous changes noted at the lung apices. Median sternotomy partially visualized. Review of the MIP images confirms the above findings CTA HEAD FINDINGS Anterior circulation: Petrous segments are widely patent. Scattered atherosclerotic change throughout the carotid siphons with associated moderate multifocal narrowing, unchanged. Left A1 widely patent. Hypoplastic right A1. Normal anterior communicating artery. Partially azygos ACA noted. 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 No visible neck remnant identified. Grossly patent flow through the adjacent stent. Interval placement of a vascular stent across the previously seen severe left M1 stenosis. Patent flow is seen through the stent. Left MCA  branches perfused distally. Mild stenosis involving the proximal right M1 segment noted, stable. Distal right MCA branches well perfused and stable from previous. Posterior circulation: Vertebral arteries remain widely patent to the vertebrobasilar junction. Posterior inferior cerebral arteries patent bilaterally. Basilar diffusely diminutive with associated mild multifocal narrowing. Superior cerebral arteries patent bilaterally. PCA supplied via hypoplastic P1 segments as well as robust bilateral posterior communicating arteries. Prominent atherosclerotic change throughout both PCAs with associated moderate to severe multifocal stenoses, right worse than left, grossly stable. Venous sinuses: Grossly patent allowing for timing of the contrast bolus Anatomic variants: Predominant fetal type origin of the PCAs. Hypoplastic right A1 segment. Review of the MIP images confirms the above findings CT Brain Perfusion Findings: CBF (<30%) Volume: 76mL Perfusion (Tmax>6.0s) volume: 79mL Mismatch Volume: 45mL Infarction Location:Negative CT perfusion for acute ischemia. On source perfusion maps, there is increased cerebral blood volume and cerebral blood flow with mildly decreased mean transit time and T-max, likely reflecting improved cerebrovascular flow to the left cerebral hemisphere due to interval stenting of the left ICA and MCA. No other perfusion abnormality. IMPRESSION: CT HEAD IMPRESSION: 1. No acute intracranial abnormality. 2. 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 3. 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 CTA HEAD AND NECK IMPRESSION: 1. Negative CTA for emergent large vessel occlusion. 2. Interval stenting at the proximal left ICA and left M1 segment without complication. Patent flow seen through both stents. 3. 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 No residual neck remnant identified. 4.  Postoperative changes from interval cutdown for left carotid artery exposure/access. Surgical drain remains in place. Small blush of contrast adjacent to the drain consistent with a small focus of active bleeding, likely venous in nature. 5. Otherwise stable CTA with extensive atherosclerotic change elsewhere throughout the major arterial vasculature of the head and neck. CT PERFUSION IMPRESSION: 1. Negative CT perfusion for acute core infarct. 2. Increased cerebral blood flow and blood volume with decreased T-max within the left cerebral hemisphere, reflecting improved vascular flow to the left MCA distribution due to the left ICA and MCA  stents. 3. Otherwise negative CT perfusion, with no other perfusion abnormality. These results were communicated to Dr. Lorraine Lax at 9:30 pmon 1/27/2021by text page via the [facility] messaging system. Electronically Signed   By: Jeannine Boga M.D.   On: 03/26/2019 22:49   DG Chest 2 View  Result Date: 04/02/2019 CLINICAL DATA:  Acute onset fever today. EXAM: CHEST - 2 VIEW COMPARISON:  Single-view of the chest 03/28/2019. FINDINGS: Right PICC is unchanged with its tip near the superior cavoatrial junction. Lungs are clear. Marked cardiomegaly. No pneumothorax or pleural fluid. No acute or focal bony abnormality. IMPRESSION: No acute disease. Marked cardiomegaly. Electronically Signed   By: Inge Rise M.D.   On: 04/02/2019 11:13   CT ANGIO NECK W OR WO CONTRAST  Result Date: 03/26/2019 CLINICAL DATA:  Follow-up examination for acute stroke, recent ICA and MCA stenting and aneurysm coiling EXAM: CT ANGIOGRAPHY HEAD AND NECK CT PERFUSION BRAIN TECHNIQUE: Multidetector CT imaging of the head and neck was performed using the standard protocol during bolus administration of intravenous contrast. Multiplanar CT image reconstructions and MIPs were obtained to evaluate the vascular anatomy. Carotid stenosis measurements (when applicable) are obtained utilizing NASCET criteria,  using the distal internal carotid diameter as the denominator. Multiphase CT imaging of the brain was performed following IV bolus contrast injection. Subsequent parametric perfusion maps were calculated using RAPID software. CONTRAST:  158mL OMNIPAQUE IOHEXOL 350 MG/ML SOLN COMPARISON:  Comparison made with prior CTA from 03/19/2019. FINDINGS: CT HEAD FINDINGS Brain: Generalized age-related cerebral atrophy with chronic small vessel ischemic disease. No acute intracranial hemorrhage. No acute large vessel territory infarct. No mass lesion, midline shift or mass effect. No hydrocephalus. No extra-axial fluid collection. 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 Vascular stent has been placed within the left M1 segment. No hyperdense vessel. Scattered vascular calcifications noted within the carotid siphons. Skull: Scalp soft tissues within normal limits.  Calvarium intact. Sinuses/Orbits: Globes and orbital soft tissues within normal limits. Scattered mucosal thickening noted within the ethmoidal air cells and right maxillary sinus. Few small air-fluid levels noted. Mastoid air cells are clear. Patient is intubated. Other: None. CTA NECK FINDINGS Aortic arch: Visualized aortic arch of normal caliber. Bovine arch with common origin of the right brachiocephalic and left common carotid artery noted. Extensive noncalcified plaque seen throughout the visualized arch and about the origin of the great vessels without hemodynamically significant stenosis. Irregular soft plaque and/or thrombus protruding into the lumen at the origin of the right brachiocephalic artery (series 12, image 38), unchanged. 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 Subclavian arteries otherwise irregular but patent without flow-limiting stenosis. Right carotid system: Right common carotid artery patent from its origin to the bifurcation without stenosis. Mild scattered plaque  about the right bifurcation/proximal right ICA without hemodynamically significant stenosis. Right ICA irregular but patent to the skull base without stenosis, dissection, or occlusion. Left carotid system: Multifocal atheromatous irregularity within the left common carotid artery without stenosis. Interval placement of a vascular stent, proximal aspect at the origin of the left ICA. Mild-to-moderate stenoses involving the mid and distal aspect of the stent, measuring up to approximately 50% by NASCET criteria. Widely patent flow otherwise seen through the stent. No intraluminal thrombus or other complication. Left ICA irregular but otherwise widely patent to the skull base without stenosis, dissection, or occlusion. Vertebral arteries: Both vertebral arteries arise from the subclavian arteries. Vertebral arteries remain widely patent within the neck without stenosis, dissection or  occlusion. Skeleton: No acute osseous abnormality. No discrete osseous lesions. Moderate cervical spondylosis noted at C5-6. Patient is edentulous. Other neck: Endotracheal and enteric tubes in place. Postoperative changes from recent cutdown and open exposure of the left common carotid artery seen at the lower anterior left neck. Percutaneous drain remains in place within this region with associated scattered foci of soft tissue emphysema. Associated postoperative swelling and blood products present within this region as well. Small linear contrast blush within this region adjacent to the surgical drain likely reflects a small amount of persistent bleeding, likely venous in nature (series 5, image 36). No other active contrast extravasation. Upper chest: Layering bilateral pleural effusions with associated atelectasis partially visualized. Paraseptal emphysematous changes noted at the lung apices. Median sternotomy partially visualized. Review of the MIP images confirms the above findings CTA HEAD FINDINGS Anterior circulation: Petrous  segments are widely patent. Scattered atherosclerotic change throughout the carotid siphons with associated moderate multifocal narrowing, unchanged. Left A1 widely patent. Hypoplastic right A1. Normal anterior communicating artery. Partially azygos ACA noted. 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 No visible neck remnant identified. Grossly patent flow through the adjacent stent. Interval placement of a vascular stent across the previously seen severe left M1 stenosis. Patent flow is seen through the stent. Left MCA branches perfused distally. Mild stenosis involving the proximal right M1 segment noted, stable. Distal right MCA branches well perfused and stable from previous. Posterior circulation: Vertebral arteries remain widely patent to the vertebrobasilar junction. Posterior inferior cerebral arteries patent bilaterally. Basilar diffusely diminutive with associated mild multifocal narrowing. Superior cerebral arteries patent bilaterally. PCA supplied via hypoplastic P1 segments as well as robust bilateral posterior communicating arteries. Prominent atherosclerotic change throughout both PCAs with associated moderate to severe multifocal stenoses, right worse than left, grossly stable. Venous sinuses: Grossly patent allowing for timing of the contrast bolus Anatomic variants: Predominant fetal type origin of the PCAs. Hypoplastic right A1 segment. Review of the MIP images confirms the above findings CT Brain Perfusion Findings: CBF (<30%) Volume: 78mL Perfusion (Tmax>6.0s) volume: 54mL Mismatch Volume: 55mL Infarction Location:Negative CT perfusion for acute ischemia. On source perfusion maps, there is increased cerebral blood volume and cerebral blood flow with mildly decreased mean transit time and T-max, likely reflecting improved cerebrovascular flow to the left cerebral hemisphere due to interval stenting of the left ICA and MCA. No other perfusion abnormality. IMPRESSION: CT  HEAD IMPRESSION: 1. No acute intracranial abnormality. 2. 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 3. 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 CTA HEAD AND NECK IMPRESSION: 1. Negative CTA for emergent large vessel occlusion. 2. Interval stenting at the proximal left ICA and left M1 segment without complication. Patent flow seen through both stents. 3. 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 No residual neck remnant identified. 4. Postoperative changes from interval cutdown for left carotid artery exposure/access. Surgical drain remains in place. Small blush of contrast adjacent to the drain consistent with a small focus of active bleeding, likely venous in nature. 5. Otherwise stable CTA with extensive atherosclerotic change elsewhere throughout the major arterial vasculature of the head and neck. CT PERFUSION IMPRESSION: 1. Negative CT perfusion for acute core infarct. 2. Increased cerebral blood flow and blood volume with decreased T-max within the left cerebral hemisphere, reflecting improved vascular flow to the left MCA distribution due to the left ICA and MCA stents. 3. Otherwise negative CT perfusion, with no other perfusion  abnormality. These results were communicated to Dr. Lorraine Lax at 9:30 pmon 1/27/2021by text page via the [facility] messaging system. Electronically Signed   By: Jeannine Boga M.D.   On: 03/26/2019 22:49   IR Transcath/Emboliz  Result Date: 04/01/2019 CLINICAL DATA:  Symptomatic high-grade stenosis of the left middle cerebral artery proximal M1 segment with history of recent ischemic stroke. 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 EXAM: TRANSCATHETER THERAPY EMBOLIZATION  COMPARISON:  Diagnostic catheter arteriogram of March 21, 2019. MEDICATIONS: Heparin 1000 units IV. Ancef 4 g IV antibiotic was administered within 1 hour of the procedure. ANESTHESIA/SEDATION: General anesthesia. CONTRAST:  Isovue 300 approximately 150 mL. FLUOROSCOPY TIME:  Fluoroscopy Time: 101 minutes 0 seconds (4155 mGy). COMPLICATIONS: None immediate. TECHNIQUE: Informed written consent was obtained from the patient after a thorough discussion of the procedural risks, benefits and alternatives. All questions were addressed. Maximal Sterile Barrier Technique was utilized including caps, mask, sterile gowns, sterile gloves, sterile drape, hand hygiene and skin antiseptic. A timeout was performed prior to the initiation of the procedure. Direct access to the left common carotid artery was provided by vascular surgery in the OR due to patient's severe peripheral vascular disease, and absent flow in the right radial artery. A 6 French Pinnacle sheath was inserted and sutured in the left common carotid artery in the mid section. The patient was brought to the angio suite with the 6 French Pinnacle sheath. This was then connected to continuous heparinized saline infusion following flushing. An arteriogram was then obtained of the left common carotid bifurcation and also intracranially. FINDINGS: The left common carotid bifurcation again demonstrates the left external carotid artery and its major branches to be widely patent. The left internal carotid artery just distal to the bulb again demonstrates a significant stenosis just distal to the carotid bulb of approximately 70-75%. More distally, the left internal carotid artery is seen to opacify to the cranial skull base. The petrous segment is widely patent. There is a mild to moderate stenosis of the distal cavernous segment of the left internal carotid artery and to a lesser degree of the supraclinoid left internal carotid artery. A left posterior communicating  artery is seen opacifying the left posterior cerebral artery distribution. The left middle cerebral artery again demonstrates a severe M1 segment stenosis with opacification of the left MCA trifurcation branches into the capillary and venous phases. The left anterior cerebral artery demonstrates mild arteriosclerotic changes in the proximal A1 and A2 segments. More distally, mild arteriosclerotic changes are also noted in the A3 segment of the left anterior cerebral artery pericallosal branch. 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 The subsequent capillary and venous phases are unremarkable. ENDOVASCULAR TREATMENT OF THE LEFT ANTERIOR CEREBRAL ARTERY PERICALLOSAL REGION ANEURYSM WITH STENT ASSISTED COILING Initial treatment angioplasty of the left internal carotid approximately was performed following advancement of a 5 mm x 30 mm Viatrac 14 angioplasty microcatheter. This had been prepped and purged with heparinized saline infusion. Over a 014 inch standard Synchro micro guidewire with a J configuration, the combination of the Viatrac 14 angioplasty balloon catheter in the micro guidewire advanced to the proximal left internal carotid artery. Using a torque device, the micro guidewire was advanced without difficulty to the distal cervical segment  of the left internal carotid artery. This was then followed by the advancement of the Viatrac 5 x 30 mm balloon in place adequate distance at the site of severe stenosis. Slow control angioplasty was then performed using micro inflation syringe device via micro tubing. Balloon was inflated to 8.3 atmospheres where it was maintained for approximately 1 minute. The balloon was then deflated and advanced more distally. A control arteriogram performed through 5 French Pinnacle sheath in the left common carotid artery again demonstrated significantly improved caliber and flow through the  angioplastied segment. The combination of the balloon and the microcatheter were advanced to the horizontal petrous segment of the left internal carotid artery. The micro guidewire was removed, and the Viatrac balloon was then exchanged for an 014 inch 300 cm Softip Transend exchange guidewire. Over the exchange micro guidewire, a 5 French 115 cm Catalyst guide catheter was advanced and positioned in the horizontal petrous segment of the left internal carotid artery. The exchange micro guidewire was then removed. Good aspiration was obtained from the hub of the 5 [place] Catalyst guide catheter in the left internal carotid artery. A control arteriogram performed through the 5 [place] Catalyst guide catheter in the left internal carotid artery demonstrated no changes in the intracranial circulation. Over a 0.014 inch standard Synchro micro guidewire with a J configuration, a Headway 17 2 tip microcatheter was advanced without difficulty to the supraclinoid left ICA. The micro guidewire was then gently manipulated with a torque device and advanced through left anterior cerebral artery A1 A2 segments followed by the microcatheter to the A3 segment of the anterior cerebral artery pericallosal branch. The wire was then advanced without difficulty distal to the neck of the aneurysm followed by the microcatheter. The guidewire was removed. Good aspiration obtained from the hub of microcatheter. A gentle control arteriogram performed through the microcatheter demonstrates safe position of tip of the microcatheter. This was then connected to continuous heparinized saline infusion. At this time after having obtained measurements of the pericallosal artery distal and proximal to the aneurysm, a 2.5 mm x 23 mm Lvis junior stent was advanced to the distal end of the microcatheter. Under constant fluoroscopic guidance using roadmap technique, the delivery microcatheter was retrieved unsheathing the distal and then the proximal Lvis  stent. A control arteriogram performed through the 5 [place] Catalyst guide catheter now advanced to the A1 segment of the left anterior cerebral artery demonstrates safe positioning and excellent apposition of the Lvis stent. At this time, the 014 inch standard Synchro micro guidewire was then advanced through the microcatheter into the aneurysm without difficulty followed by the microcatheter. The guidewire was removed. Good aspiration was obtained with the intra aneurysmal microcatheter. This was then connected to continuous heparinized saline infusion. The first coil utilized for aneurysm embolization was a Microplex 10 5 mm x 15 mm HyperSoft 3D coil. This was advanced using biplane roadmap technique and constant fluoroscopic guidance in a coaxial manner and with constant heparinized saline infusion to the distal end of the intra-aneurysmal microcatheter. This coil was then advanced under constant fluoroscopic guidance. Prior to its detachment, a control arteriogram performed through the 5 [place] Catalyst guide catheter in the left anterior cerebral artery demonstrates safe positioning of the coil. This was then detached without difficulty. This was subsequently followed by advancement of a 4 mm x 8 mm HyperSoft 3D coil, a 2.5 mm x 8 cm HyperSoft 3D coil, a 3 mm x 6 cm HyperSoft 3D coil, a 3 mm x  4 cm Microplex HyperSoft 3D coil and finally a 2 mm x 6 cm Microplex HyperSoft helical coil. Each of these coils was advanced into the aneurysm under fluoroscopic guidance using biplane roadmap technique. Prior to their detachment, a control arteriogram was performed through the guide catheter to ensure safe positioning of the coil mass. Following the final coil there was near complete obliteration of the aneurysm. The microcatheter was gently retrieved from the coil mass without any difficulty. A control arteriogram performed demonstrated near complete occlusion of the aneurysm with wide patency of the Lvis stent across  the neck of the aneurysm. ENDOVASCULAR STENT ASSISTED ANGIOPLASTY OF SYMPTOMATIC HIGH-GRADE STENOSIS OF THE LEFT MIDDLE CEREBRAL ARTERY M1 SEGMENT Over a 0.014 inch standard Synchro micro guidewire, a 2 mm x 15 mm Gateway angioplasty balloon catheter which had been prepped with 50% contrast and 50% heparinized saline infusion was advanced through the 5 [place] Catalyst guide catheter in the petrous segment of the left internal carotid artery to the supraclinoid left ICA. Using a torque device, access through the nearly completely occluded left middle cerebral artery was achieved with the micro guidewire which was advanced to the distal M2 M3 region of the inferior division. The balloon was then advanced without difficulty and positioned such that the proximal and the distal markers were adequate distant and covered the segmental high-grade stenosis. A control angioplasty was then performed using micro inflation syringe device via micro tubing. Slow increments were performed in the atmospheric pressures of the balloon to 4.9 atmospheres achieving approximately 1.95 mm diameter of the balloon. This was maintained for approximately 90 seconds. The balloon was then gently deflated and retrieved proximally whilst the wire was maintained distally in the M2 M3 region. A control arteriogram performed through the 5 [place] Catalyst guide catheter demonstrated significantly improved caliber and flow through the angioplastied segment. The Gateway balloon was then advanced to the M2 M3 region over the micro guidewire. The micro guidewire was then retrieved. Free aspiration of blood was noted from the hub of the Gateway microcatheter. This was in turn exchanged for a 014 inch 300 cm Softip Transend exchange micro guidewire using biplane roadmap technique and constant fluoroscopic guidance. The micro guidewire had a J shaped configuration to avoid dissections or inducing spasm. A control arteriogram performed through the Catalyst  guide catheter in the left internal carotid artery following the exchange micro guidewire placement demonstrated significantly improved caliber and flow through the angioplastied segment to approximately 60-70% patency. It was elected to proceed with placement of a Wingspan stent across the angioplastied segment of the left middle cerebral artery. A 3.5 mm x 20 mm Wingspan system was then prepped and purged with heparinized saline infusion in its housing. The inner core was purged with heparinized saline infusion. Also the delivery microcatheter was purged with heparinized saline infusion and retrogradely via a Tuohy Borst and a 3 way stopcock to ensure absence of any air. The system was then advanced as a unit over the exchange micro guidewire without difficulty. The distal and the proximal markers of the stent were then advanced such that they were adequate distant at the site of the angioplastied segment. The Tuohy Borst at the hub of the microcatheter was then loosened. The inner core was then advanced with its butt at the proximal portion of the stent. Thereafter, whilst holding the inner core of the stent, the delivery microcatheter was retrieved unsheathing the distal and then the proximal portion of the stent. Once delivered, a control arteriogram  performed through the 5 [place] Catalyst guide catheter demonstrated excellent apposition of the stent and coverage proximally and distally. Wide patency of the left middle cerebral artery angioplasty and stent segment was seen of almost 70-80%. The delivery micro guidewire was then retrieved and removed. Control arteriograms performed through the left internal carotid artery continued to demonstrate excellent flow through left MCA distribution without evidence of intraluminal filling defects or of occlusions involving the anterior or the middle cerebral artery distributions. 6 mg of intra-arterial Integrilin were given following the placement of the Wingspan stent  system. A control arteriogram performed through the 6 French Pinnacle sheath following removal of the Catalyst guide catheter now demonstrated the previously angioplastied segment of the left internal carotid artery proximally. The exchange micro guidewire had been maintained distally in the horizontal petrous segment. Noted was modest recoil at the site of the previous angioplasty. It was, therefore, decided to proceed with placement of a stent across the angioplastied segment of the previously severely stenotic left internal carotid artery. Over a 0.014 inch exchange micro guidewire, with the distal end in the petrous horizontal segment, measurements were performed of the proximal and the distal landing zones of the planned stent. After evaluating the measurements obtained, it was decided to proceed with placement of a 9-7 mm x 40 mm Xact stent. This was prepped and purged retrogradely with heparinized saline infusion. Using the rapid exchange technique, the stent delivery system was advanced without difficulty and positioned such that the distal and the proximal markers were adequate distant from the site of the previous angioplasty segment. This stent was then deployed without any difficulty in the usual manner. The delivery catheter system was then retrieved and removed whilst the exchange micro guidewire was maintained distally. A control arteriogram performed through the 6 French Pinnacle sheath immediately and 10 and 20 minutes placement of the stent continued to demonstrate excellent flow through the stented segment with mild spasm at its distal end. Because of the risk of development of platelet aggregation within the stent, it was decided to give additional 6 mg of Integrilin intra-arterially. A final control arteriogram performed approximately 25 minutes following placement of the left internal carotid artery stent continued to demonstrate excellent flow intracranially and extra cranially. The left  pericallosal artery aneurysm appeared nearly completely obliterated with patency of the stent. The left middle cerebral artery angioplasty in the stented segment now demonstrated close to 80% patency without gross evidence of intraluminal filling defects or of occlusions. Throughout the procedure, the patient's blood pressure and neurological status remained stable. The patient's ACT was maintained in the region of approximately 130 seconds throughout the procedure. A flat panel CT of the brain performed with the patient on the table demonstrated no evidence of intracranial hemorrhage, mass effect or midline shift. The patient was then returned to the OR for removal of the 6 French Pinnacle sheath by vascular surgery as per plan. It was planned to a keep the patient intubated on ventilator for airway protection given the antiplatelets and anticoagulation given for the entirety of the procedure. 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 PLAN: Patient to remain intubated for airway protection given the direct left carotid artery access and use of  antiplatelets, and anticoagulation. Electronically Signed   By: Luanne Bras M.D.   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  EXAM: TRANSCATHETER THERAPY EMBOLIZATION  COMPARISON:  Diagnostic catheter arteriogram of March 21, 2019.  MEDICATIONS: Heparin 1000 units IV. Ancef 4 g IV antibiotic was administered within 1 hour of the procedure.  ANESTHESIA/SEDATION: General anesthesia.  CONTRAST:  Isovue 300 approximately 150 mL.  FLUOROSCOPY TIME:  Fluoroscopy Time: 101 minutes 0 seconds (4155 mGy).  COMPLICATIONS: None immediate.  TECHNIQUE: Informed written consent was obtained from the patient after a thorough discussion of the procedural risks, benefits and alternatives. All questions were addressed. Maximal Sterile Barrier Technique was utilized including caps, mask, sterile gowns, sterile gloves, sterile drape, hand hygiene and skin antiseptic. A timeout was performed prior to the initiation of the procedure.  Direct access to the left common carotid artery was provided by vascular surgery in the OR due to patient's severe peripheral vascular disease, and absent flow in the right radial artery.  A 6 French Pinnacle sheath was inserted and sutured in the left common carotid artery in the mid section. The patient was brought to the angio suite with the 6 French Pinnacle sheath.  This was then connected to continuous heparinized saline infusion following flushing. An arteriogram was then obtained of the left common carotid bifurcation and also intracranially.  FINDINGS: The left common carotid bifurcation again demonstrates the left external carotid artery and its major branches to be widely patent.  The left internal carotid artery just distal to the bulb again demonstrates a significant stenosis just distal to the carotid bulb of approximately 70-75%.  More distally, the left internal carotid artery is seen to opacify to the cranial skull base.  The petrous segment is widely patent.  There is a mild to moderate stenosis of the distal cavernous  segment of the left internal carotid artery and to a lesser degree of the supraclinoid left internal carotid artery. A left posterior communicating artery is seen opacifying the left posterior cerebral artery distribution.  The left middle cerebral artery again demonstrates a severe M1 segment stenosis with opacification of the left MCA trifurcation branches into the capillary and venous phases.  The left anterior cerebral artery demonstrates mild arteriosclerotic changes in the proximal A1 and A2 segments.  More distally, mild arteriosclerotic changes are also noted in the A3 segment of the left anterior cerebral artery pericallosal branch.  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  The subsequent capillary and venous phases are unremarkable.  ENDOVASCULAR TREATMENT OF THE LEFT ANTERIOR CEREBRAL ARTERY PERICALLOSAL REGION ANEURYSM WITH STENT ASSISTED COILING  Initial treatment angioplasty of the left internal carotid approximately was performed following advancement of a 5 mm x 30 mm Viatrac 14 angioplasty microcatheter. This had been prepped and purged with heparinized saline infusion. Over a 014 inch standard Synchro micro guidewire with a J configuration, the combination of the Viatrac 14 angioplasty balloon catheter in the micro guidewire advanced to the proximal left internal carotid artery.  Using a torque device,  the micro guidewire was advanced without difficulty to the distal cervical segment of the left internal carotid artery.  This was then followed by the advancement of the Viatrac 5 x 30 mm balloon in place adequate distance at the site of severe stenosis.  Slow control angioplasty was then performed using micro inflation syringe device via micro tubing. Balloon was inflated to 8.3 atmospheres where it was maintained for approximately 1 minute. The balloon was then deflated and advanced more distally. A  control arteriogram performed through 5 [place] Pinnacle sheath in the left common carotid artery again demonstrated significantly improved caliber and flow through the angioplastied segment.  The combination of the balloon and the microcatheter were advanced to the horizontal petrous segment of the left internal carotid artery.  The micro guidewire was removed, and the Viatrac balloon was then exchanged for an 014 inch 300 cm Softip Transend exchange guidewire. Over the exchange micro guidewire, a 5 French 115 cm Catalyst guide catheter was advanced and positioned in the horizontal petrous segment of the left internal carotid artery.  The exchange micro guidewire was then removed. Good aspiration was obtained from the hub of the 5 [place] Catalyst guide catheter in the left internal carotid artery. A control arteriogram performed through the 5 [place] Catalyst guide catheter in the left internal carotid artery demonstrated no changes in the intracranial circulation.  Over a 0.014 inch standard Synchro micro guidewire with a J configuration, a Headway 17 2 tip microcatheter was advanced without difficulty to the supraclinoid left ICA.  The micro guidewire was then gently manipulated with a torque device and advanced through left anterior cerebral artery A1 A2 segments followed by the microcatheter to the A3 segment of the anterior cerebral artery pericallosal branch. The wire was then advanced without difficulty distal to the neck of the aneurysm followed by the microcatheter. The guidewire was removed. Good aspiration obtained from the hub of microcatheter. A gentle control arteriogram performed through the microcatheter demonstrates safe position of tip of the microcatheter. This was then connected to continuous heparinized saline infusion. At this time after having obtained measurements of the pericallosal artery distal and proximal to the aneurysm, a 2.5 mm x 23 mm Lvis junior stent was advanced to the distal  end of the microcatheter.  Under constant fluoroscopic guidance using roadmap technique, the delivery microcatheter was retrieved unsheathing the distal and then the proximal Lvis stent. A control arteriogram performed through the 5 [place] Catalyst guide catheter now advanced to the A1 segment of the left anterior cerebral artery demonstrates safe positioning and excellent apposition of the Lvis stent. At this time, the 014 inch standard Synchro micro guidewire was then advanced through the microcatheter into the aneurysm without difficulty followed by the microcatheter. The guidewire was removed. Good aspiration was obtained with the intra aneurysmal microcatheter. This was then connected to continuous heparinized saline infusion.  The first coil utilized for aneurysm embolization was a Microplex 10 5 mm x 15 mm HyperSoft 3D coil. This was advanced using biplane roadmap technique and constant fluoroscopic guidance in a coaxial manner and with constant heparinized saline infusion to the distal end of the intra-aneurysmal microcatheter. This coil was then advanced under constant fluoroscopic guidance. Prior to its detachment, a control arteriogram performed through the 5 [place] Catalyst guide catheter in the left anterior cerebral artery demonstrates safe positioning of the coil. This was then detached without difficulty. This was subsequently followed by advancement of a 4 mm x 8 mm HyperSoft 3D coil, a  2.5 mm x 8 cm HyperSoft 3D coil, a 3 mm x 6 cm HyperSoft 3D coil, a 3 mm x 4 cm Microplex HyperSoft 3D coil and finally a 2 mm x 6 cm Microplex HyperSoft helical coil. Each of these coils was advanced into the aneurysm under fluoroscopic guidance using biplane roadmap technique. Prior to their detachment, a control arteriogram was performed through the guide catheter to ensure safe positioning of the coil mass.  Following the final coil there was near complete obliteration of the aneurysm.  The microcatheter was  gently retrieved from the coil mass without any difficulty. A control arteriogram performed demonstrated near complete occlusion of the aneurysm with wide patency of the Lvis stent across the neck of the aneurysm.  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  Using a torque device, access through the nearly completely occluded left middle cerebral artery was achieved with the micro guidewire which was advanced to the distal M2 M3 region of the inferior division. The balloon was then advanced without difficulty and positioned such that the proximal and the distal markers were adequate distant and covered the segmental high-grade stenosis.  A control angioplasty was then performed using micro inflation syringe device via micro tubing. Slow increments were performed in the atmospheric pressures of the balloon to 4.9 atmospheres achieving approximately 1.95 mm diameter of the balloon. This was maintained for approximately 90 seconds. The balloon was then gently deflated and retrieved proximally whilst the wire was maintained distally in the M2 M3 region.  A control arteriogram performed through the 5 [place] Catalyst guide catheter demonstrated significantly improved caliber and flow through the angioplastied segment. The Gateway balloon was then advanced to the M2 M3 region over the micro guidewire. The micro guidewire was then retrieved. Free aspiration of blood was noted from the hub of the Gateway microcatheter.  This was in turn exchanged for a 014 inch 300 cm Softip Transend exchange micro guidewire using  biplane roadmap technique and constant fluoroscopic guidance. The micro guidewire had a J shaped configuration to avoid dissections or inducing spasm.  A control arteriogram performed through the Catalyst guide catheter in the left internal carotid artery following the exchange micro guidewire placement demonstrated significantly improved caliber and flow through the angioplastied segment to approximately 60-70% patency.  It was elected to proceed with placement of a Wingspan stent across the angioplastied segment of the left middle cerebral artery.  A 3.5 mm x 20 mm Wingspan system was then prepped and purged with heparinized saline infusion in its housing.  The inner core was purged with heparinized saline infusion. Also the delivery microcatheter was purged with heparinized saline infusion and retrogradely via a Tuohy Borst and a 3 way stopcock to ensure absence of any air.  The system was then advanced as a unit over the exchange micro guidewire without difficulty.  The distal and the proximal markers of the stent were then advanced such that they were adequate distant at the site of the angioplastied segment.  The Tuohy Borst at the hub of the microcatheter was then loosened. The inner core was then advanced with its butt at  the proximal portion of the stent. Thereafter, whilst holding the inner core of the stent, the delivery microcatheter was retrieved unsheathing the distal and then the proximal portion of the stent. Once delivered, a control arteriogram performed through the 5 [place] Catalyst guide catheter demonstrated excellent apposition of the stent and coverage proximally and distally. Wide patency of the left middle cerebral artery angioplasty and stent segment was seen of almost 70-80%.  The delivery micro guidewire was then retrieved and removed.  Control arteriograms performed through the left internal carotid artery continued to demonstrate excellent flow through left MCA distribution  without evidence of intraluminal filling defects or of occlusions involving the anterior or the middle cerebral artery distributions.  6 mg of intra-arterial Integrilin were given following the placement of the Wingspan stent system.  A control arteriogram performed through the 6 [place] Pinnacle sheath following removal of the Catalyst guide catheter now demonstrated the previously angioplastied segment of the left internal carotid artery proximally. The exchange micro guidewire had been maintained distally in the horizontal petrous segment.  Noted was modest recoil at the site of the previous angioplasty. It was, therefore, decided to proceed with placement of a stent across the angioplastied segment of the previously severely stenotic left internal carotid artery.  Over a 0.014 inch exchange micro guidewire, with the distal end in the petrous horizontal segment, measurements were performed of the proximal and the distal landing zones of the planned stent. After evaluating the measurements obtained, it was decided to proceed with placement of a 9-7 mm x 40 mm Xact stent. This was prepped and purged retrogradely with heparinized saline infusion. Using the rapid exchange technique, the stent delivery system was advanced without difficulty and positioned such that the distal and the proximal markers were adequate distant from the site of the previous angioplasty segment. This stent was then deployed without any difficulty in the usual manner. The delivery catheter system was then retrieved and removed whilst the exchange micro guidewire was maintained distally.  A control arteriogram performed through the 6 [place] Pinnacle sheath immediately and 10 and 20 minutes placement of the stent continued to demonstrate excellent flow through the stented segment with mild spasm at its distal end. Because of the risk of development of platelet aggregation within the stent, it was decided to give additional 6 mg of  Integrilin intra-arterially.  A final control arteriogram performed approximately 25 minutes following placement of the left internal carotid artery stent continued to demonstrate excellent flow intracranially and extra cranially. 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 The left middle cerebral artery angioplasty in the stented segment now demonstrated close to 80% patency without gross evidence of intraluminal filling defects or of occlusions.  Throughout the procedure, the patient's blood pressure and neurological status remained stable. The  patient's ACT was maintained in the region of approximately 130 seconds throughout the procedure.  A flat panel CT of the brain performed with the patient on the table demonstrated no evidence of intracranial hemorrhage, mass effect or midline shift.  The patient was then returned to the OR for removal of the 6 [place] Pinnacle sheath by vascular surgery as per plan.  It was planned to a keep the patient intubated on ventilator for airway protection given the antiplatelets and anticoagulation given for the entirety of the procedure.  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  Status post angioplasty  followed by stenting of the left middle cerebral artery severe symptomatic stenosis with 80% patency post angioplasty and stenting.  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  PLAN: Patient to remain intubated for airway protection given the direct left carotid artery access and use of antiplatelets, and anticoagulation.   Electronically Signed   By: Luanne Bras M.D.   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  EXAM: TRANSCATHETER THERAPY EMBOLIZATION  COMPARISON:  Diagnostic catheter arteriogram of March 21, 2019.  MEDICATIONS: Heparin 1000 units IV. Ancef 4 g IV antibiotic was administered within 1 hour of the procedure.  ANESTHESIA/SEDATION: General anesthesia.  CONTRAST:  Isovue 300 approximately 150 mL.  FLUOROSCOPY TIME:  Fluoroscopy Time: 101 minutes 0 seconds (4155 mGy).  COMPLICATIONS: None immediate.  TECHNIQUE: Informed written consent was obtained from the patient after a thorough discussion of the procedural risks, benefits and alternatives. All questions were addressed. Maximal Sterile Barrier Technique was utilized including caps, mask, sterile gowns, sterile gloves, sterile drape, hand hygiene and skin antiseptic. A timeout was performed prior to the initiation of the procedure.  Direct access to the left common carotid artery was provided by vascular surgery in the OR due to patient's severe peripheral vascular disease, and absent flow in the right radial artery.  A 6 French Pinnacle sheath was inserted and sutured in the left common carotid artery in the mid section. The patient was brought to the angio suite with the 6 French Pinnacle sheath.  This was then connected to continuous heparinized saline infusion following flushing. An arteriogram was then obtained of the left common carotid bifurcation and also intracranially.  FINDINGS: The left common carotid bifurcation again demonstrates the left external carotid artery and its major branches to be widely patent.  The left internal carotid artery just distal to the bulb again demonstrates a significant stenosis  just distal to the carotid bulb of approximately 70-75%.  More distally, the left internal carotid artery is seen to opacify to the cranial skull base.  The petrous segment is widely patent.  There is a mild to moderate stenosis of the distal cavernous segment of the left internal carotid artery and to a lesser degree of the supraclinoid left internal carotid artery. A left posterior communicating artery is seen opacifying the left posterior cerebral artery distribution.  The left middle cerebral artery again demonstrates a severe M1 segment stenosis with opacification of the left MCA trifurcation branches into the capillary and venous phases.  The left anterior cerebral artery demonstrates mild arteriosclerotic changes in the proximal A1 and A2 segments.  More distally, mild arteriosclerotic changes are also noted in the A3 segment of the left anterior cerebral artery pericallosal branch.  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  The subsequent capillary and venous phases are unremarkable.  ENDOVASCULAR TREATMENT OF THE LEFT ANTERIOR CEREBRAL ARTERY PERICALLOSAL REGION ANEURYSM WITH STENT ASSISTED COILING  Initial treatment angioplasty of the left internal carotid approximately was performed following advancement of a 5 mm  x 30 mm Viatrac 14 angioplasty microcatheter. This had been prepped and purged with heparinized saline infusion. Over a 014 inch standard Synchro micro guidewire with a J configuration, the combination of the Viatrac 14 angioplasty balloon catheter in the micro guidewire advanced to the proximal left internal carotid artery.  Using a torque device, the micro guidewire was advanced without difficulty to the distal cervical segment of the left internal carotid artery.  This was then followed by the advancement of the Viatrac 5 x 30 mm balloon in place adequate distance at the site of severe stenosis.   Slow control angioplasty was then performed using micro inflation syringe device via micro tubing. Balloon was inflated to 8.3 atmospheres where it was maintained for approximately 1 minute. The balloon was then deflated and advanced more distally. A control arteriogram performed through 5 French Pinnacle sheath in the left common carotid artery again demonstrated significantly improved caliber and flow through the angioplastied segment.  The combination of the balloon and the microcatheter were advanced to the horizontal petrous segment of the left internal carotid artery.  The micro guidewire was removed, and the Viatrac balloon was then exchanged for an 014 inch 300 cm Softip Transend exchange guidewire. Over the exchange micro guidewire, a 5 French 115 cm Catalyst guide catheter was advanced and positioned in the horizontal petrous segment of the left internal carotid artery.  The exchange micro guidewire was then removed. Good aspiration was obtained from the hub of the 5 [place] Catalyst guide catheter in the left internal carotid artery. A control arteriogram performed through the 5 [place] Catalyst guide catheter in the left internal carotid artery demonstrated no changes in the intracranial circulation.  Over a 0.014 inch standard Synchro micro guidewire with a J configuration, a Headway 17 2 tip microcatheter was advanced without difficulty to the supraclinoid left ICA.  The micro guidewire was then gently manipulated with a torque device and advanced through left anterior cerebral artery A1 A2 segments followed by the microcatheter to the A3 segment of the anterior cerebral artery pericallosal branch. The wire was then advanced without difficulty distal to the neck of the aneurysm followed by the microcatheter. The guidewire was removed. Good aspiration obtained from the hub of microcatheter. A gentle control arteriogram performed through the microcatheter demonstrates safe position of tip of the  microcatheter. This was then connected to continuous heparinized saline infusion. At this time after having obtained measurements of the pericallosal artery distal and proximal to the aneurysm, a 2.5 mm x 23 mm Lvis junior stent was advanced to the distal end of the microcatheter.  Under constant fluoroscopic guidance using roadmap technique, the delivery microcatheter was retrieved unsheathing the distal and then the proximal Lvis stent. A control arteriogram performed through the 5 [place] Catalyst guide catheter now advanced to the A1 segment of the left anterior cerebral artery demonstrates safe positioning and excellent apposition of the Lvis stent. At this time, the 014 inch standard Synchro micro guidewire was then advanced through the microcatheter into the aneurysm without difficulty followed by the microcatheter. The guidewire was removed. Good aspiration was obtained with the intra aneurysmal microcatheter. This was then connected to continuous heparinized saline infusion.  The first coil utilized for aneurysm embolization was a Microplex 10 5 mm x 15 mm HyperSoft 3D coil. This was advanced using biplane roadmap technique and constant fluoroscopic guidance in a coaxial manner and with constant heparinized saline infusion to the distal end of the intra-aneurysmal microcatheter. This coil was then  advanced under constant fluoroscopic guidance. Prior to its detachment, a control arteriogram performed through the 5 [place] Catalyst guide catheter in the left anterior cerebral artery demonstrates safe positioning of the coil. This was then detached without difficulty. This was subsequently followed by advancement of a 4 mm x 8 mm HyperSoft 3D coil, a 2.5 mm x 8 cm HyperSoft 3D coil, a 3 mm x 6 cm HyperSoft 3D coil, a 3 mm x 4 cm Microplex HyperSoft 3D coil and finally a 2 mm x 6 cm Microplex HyperSoft helical coil. Each of these coils was advanced into the aneurysm under fluoroscopic guidance using biplane  roadmap technique. Prior to their detachment, a control arteriogram was performed through the guide catheter to ensure safe positioning of the coil mass.  Following the final coil there was near complete obliteration of the aneurysm.  The microcatheter was gently retrieved from the coil mass without any difficulty. A control arteriogram performed demonstrated near complete occlusion of the aneurysm with wide patency of the Lvis stent across the neck of the aneurysm.  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  Using a torque device, access through the nearly completely occluded left middle cerebral artery was achieved with the micro guidewire which was advanced to the distal M2 M3 region of the inferior division. The balloon was then advanced without difficulty and positioned such that the proximal and the distal markers were adequate distant and covered the segmental high-grade stenosis.  A control angioplasty was then performed using micro inflation syringe device via micro tubing. Slow increments were performed in the atmospheric pressures of the balloon to 4.9 atmospheres achieving approximately 1.95 mm diameter of the balloon. This was maintained for approximately 90 seconds. The balloon was then gently deflated and retrieved proximally whilst the wire was maintained distally in the M2 M3 region.  A control arteriogram performed through the 5 [place] Catalyst guide catheter demonstrated significantly improved caliber and flow through the angioplastied segment. The Gateway balloon was then advanced to the  M2 M3 region over the micro guidewire. The micro guidewire was then retrieved. Free aspiration of blood was noted from the hub of the Gateway microcatheter.  This was in turn exchanged for a 014 inch 300 cm Softip Transend exchange micro guidewire using biplane roadmap technique and constant fluoroscopic guidance. The micro guidewire had a J shaped configuration to avoid dissections or inducing spasm.  A control arteriogram performed through the Catalyst guide catheter in the left internal carotid artery following the exchange micro guidewire placement demonstrated significantly improved caliber and flow through the angioplastied segment to approximately 60-70% patency.  It was elected to proceed with placement of a Wingspan stent across the angioplastied segment of the left middle cerebral artery.  A 3.5 mm x 20 mm Wingspan system was then prepped and purged with heparinized saline infusion in its housing.  The inner core was purged with heparinized saline infusion. Also the delivery microcatheter was purged with heparinized saline infusion and retrogradely via a Tuohy Borst and a 3 way stopcock to ensure absence of any air.  The system was then advanced as a unit over  the exchange micro guidewire without difficulty.  The distal and the proximal markers of the stent were then advanced such that they were adequate distant at the site of the angioplastied segment.  The Tuohy Borst at the hub of the microcatheter was then loosened. The inner core was then advanced with its butt at the proximal portion of the stent. Thereafter, whilst holding the inner core of the stent, the delivery microcatheter was retrieved unsheathing the distal and then the proximal portion of the stent. Once delivered, a control arteriogram performed through the 5 [place] Catalyst guide catheter demonstrated excellent apposition of the stent and coverage proximally and distally. Wide patency of the left middle cerebral artery angioplasty  and stent segment was seen of almost 70-80%.  The delivery micro guidewire was then retrieved and removed.  Control arteriograms performed through the left internal carotid artery continued to demonstrate excellent flow through left MCA distribution without evidence of intraluminal filling defects or of occlusions involving the anterior or the middle cerebral artery distributions.  6 mg of intra-arterial Integrilin were given following the placement of the Wingspan stent system.  A control arteriogram performed through the 6 French Pinnacle sheath following removal of the Catalyst guide catheter now demonstrated the previously angioplastied segment of the left internal carotid artery proximally. The exchange micro guidewire had been maintained distally in the horizontal petrous segment.  Noted was modest recoil at the site of the previous angioplasty. It was, therefore, decided to proceed with placement of a stent across the angioplastied segment of the previously severely stenotic left internal carotid artery.  Over a 0.014 inch exchange micro guidewire, with the distal end in the petrous horizontal segment, measurements were performed of the proximal and the distal landing zones of the planned stent. After evaluating the measurements obtained, it was decided to proceed with placement of a 9-7 mm x 40 mm Xact stent. This was prepped and purged retrogradely with heparinized saline infusion. Using the rapid exchange technique, the stent delivery system was advanced without difficulty and positioned such that the distal and the proximal markers were adequate distant from the site of the previous angioplasty segment. This stent was then deployed without any difficulty in the usual manner. The delivery catheter system was then retrieved and removed whilst the exchange micro guidewire was maintained distally.  A control arteriogram performed through the 6 French Pinnacle sheath immediately and 10 and 20 minutes  placement of the stent continued to demonstrate excellent flow through the stented segment with mild spasm at its distal end. Because of the risk of development of platelet aggregation within the stent, it was decided to give additional 6 mg of Integrilin intra-arterially.  A final control arteriogram performed approximately 25 minutes following placement of the left internal carotid artery stent continued to demonstrate excellent flow intracranially and extra cranially. 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 The left middle cerebral artery angioplasty in the stented segment now demonstrated close to 80% patency without gross evidence of intraluminal filling defects or of occlusions.  Throughout the procedure, the patient's blood pressure and neurological status remained stable. The  patient's ACT was maintained in the region of approximately 130 seconds throughout the procedure.  A flat panel CT of the brain performed with the patient on the table demonstrated no evidence of intracranial hemorrhage, mass effect or midline shift.  The patient was then returned to the OR for removal of the 6 French Pinnacle sheath by vascular surgery  as per plan.  It was planned to a keep the patient intubated on ventilator for airway protection given the antiplatelets and anticoagulation given for the entirety of the procedure.  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  PLAN: Patient to remain intubated for airway protection given the direct left carotid artery access  and use of antiplatelets, and anticoagulation.   Electronically Signed   By: Luanne Bras M.D.   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  EXAM: TRANSCATHETER THERAPY EMBOLIZATION  COMPARISON:  Diagnostic catheter arteriogram of March 21, 2019.  MEDICATIONS: Heparin 1000 units IV. Ancef 4 g IV antibiotic was administered within 1 hour of the procedure.  ANESTHESIA/SEDATION: General anesthesia.  CONTRAST:  Isovue 300 approximately 150 mL.  FLUOROSCOPY TIME:  Fluoroscopy Time: 101 minutes 0 seconds (4155 mGy).  COMPLICATIONS: None immediate.  TECHNIQUE: Informed written consent was obtained from the patient after a thorough discussion of the procedural risks, benefits and alternatives. All questions were addressed. Maximal Sterile Barrier Technique was utilized including caps, mask, sterile gowns, sterile gloves, sterile drape, hand hygiene and skin antiseptic. A timeout was performed prior to the initiation of the procedure.  Direct access to the left common carotid artery was provided by vascular surgery in the OR due to patient's severe peripheral vascular disease, and absent flow in the right radial artery.  A 6 French Pinnacle sheath was inserted and sutured in the left common carotid artery in the mid section. The patient was brought to the angio suite with the 6 French Pinnacle sheath.  This was then connected to continuous heparinized saline infusion following flushing. An arteriogram was then obtained of the left common carotid bifurcation and also intracranially.   FINDINGS: The left common carotid bifurcation again demonstrates the left external carotid artery and its major branches to be widely patent.  The left internal carotid artery just distal to the bulb again demonstrates a significant stenosis just distal to the carotid bulb of approximately 70-75%.  More distally, the left internal carotid artery is seen to opacify to the cranial skull base.  The petrous segment is widely patent.  There is a mild to moderate stenosis of the distal cavernous segment of the left internal carotid artery and to a lesser degree of the supraclinoid left internal carotid artery. A left posterior communicating artery is seen opacifying the left posterior cerebral artery distribution.  The left middle cerebral artery again demonstrates a severe M1 segment stenosis with opacification of the left MCA trifurcation branches into the capillary and venous phases.  The left anterior cerebral artery demonstrates mild arteriosclerotic changes in the proximal A1 and A2 segments.  More distally, mild arteriosclerotic changes are also noted in the A3 segment of the left anterior cerebral artery pericallosal branch.  Seen is a large lobulated saccular aneurysm arising at the  junction of the A2 A3 region of the left anterior cerebral artery.  The subsequent capillary and venous phases are unremarkable.  ENDOVASCULAR TREATMENT OF THE LEFT ANTERIOR CEREBRAL ARTERY PERICALLOSAL REGION ANEURYSM WITH STENT ASSISTED COILING  Initial treatment angioplasty of the left internal carotid approximately was performed following advancement of a 5 mm x 30 mm Viatrac 14 angioplasty microcatheter. This had been prepped and purged with heparinized saline infusion. Over a 014 inch standard Synchro micro guidewire with a J configuration, the combination of the Viatrac 14 angioplasty balloon catheter in the micro guidewire advanced to the proximal left internal carotid artery.  Using a torque device, the micro  guidewire was advanced without difficulty to the distal cervical segment of the left internal carotid artery.  This was then followed by the advancement of the Viatrac 5 x 30 mm balloon in place adequate distance at the site of severe stenosis.  Slow control angioplasty was then performed using micro inflation syringe device via micro tubing. Balloon was inflated to 8.3 atmospheres where it was maintained for approximately 1 minute. The balloon was then deflated and advanced more distally. A control arteriogram performed through 5 French Pinnacle sheath in the left common carotid artery again demonstrated significantly improved caliber and flow through the angioplastied segment.  The combination of the balloon and the microcatheter were advanced to the horizontal petrous segment of the left internal carotid artery.  The micro guidewire was removed, and the Viatrac balloon was then exchanged for an 014 inch 300 cm Softip Transend exchange guidewire. Over the exchange micro guidewire, a 5 French 115 cm Catalyst guide catheter was advanced and positioned in the horizontal petrous segment of the left internal carotid artery.  The exchange micro guidewire was then removed. Good aspiration was obtained from the hub of the 5 [place] Catalyst guide catheter in the left internal carotid artery. A control arteriogram performed through the 5 [place] Catalyst guide catheter in the left internal carotid artery demonstrated no changes in the intracranial circulation.  Over a 0.014 inch standard Synchro micro guidewire with a J configuration, a Headway 17 2 tip microcatheter was advanced without difficulty to the supraclinoid left ICA.  The micro guidewire was then gently manipulated with a torque device and advanced through left anterior cerebral artery A1 A2 segments followed by the microcatheter to the A3 segment of the anterior cerebral artery pericallosal branch. The wire was then advanced without difficulty distal to  the neck of the aneurysm followed by the microcatheter. The guidewire was removed. Good aspiration obtained from the hub of microcatheter. A gentle control arteriogram performed through the microcatheter demonstrates safe position of tip of the microcatheter. This was then connected to continuous heparinized saline infusion. At this time after having obtained measurements of the pericallosal artery distal and proximal to the aneurysm, a 2.5 mm x 23 mm Lvis junior stent was advanced to the distal end of the microcatheter.  Under constant fluoroscopic guidance using roadmap technique, the delivery microcatheter was retrieved unsheathing the distal and then the proximal Lvis stent. A control arteriogram performed through the 5 [place] Catalyst guide catheter now advanced to the A1 segment of the left anterior cerebral artery demonstrates safe positioning and excellent apposition of the Lvis stent. At this time, the 014 inch standard Synchro micro guidewire was then advanced through the microcatheter into the aneurysm without difficulty followed by the microcatheter. The guidewire was removed. Good aspiration was obtained with the intra aneurysmal microcatheter. This was then connected to continuous heparinized  saline infusion.  The first coil utilized for aneurysm embolization was a Microplex 10 5 mm x 15 mm HyperSoft 3D coil. This was advanced using biplane roadmap technique and constant fluoroscopic guidance in a coaxial manner and with constant heparinized saline infusion to the distal end of the intra-aneurysmal microcatheter. This coil was then advanced under constant fluoroscopic guidance. Prior to its detachment, a control arteriogram performed through the 5 [place] Catalyst guide catheter in the left anterior cerebral artery demonstrates safe positioning of the coil. This was then detached without difficulty. This was subsequently followed by advancement of a 4 mm x 8 mm HyperSoft 3D coil, a 2.5 mm x 8 cm  HyperSoft 3D coil, a 3 mm x 6 cm HyperSoft 3D coil, a 3 mm x 4 cm Microplex HyperSoft 3D coil and finally a 2 mm x 6 cm Microplex HyperSoft helical coil. Each of these coils was advanced into the aneurysm under fluoroscopic guidance using biplane roadmap technique. Prior to their detachment, a control arteriogram was performed through the guide catheter to ensure safe positioning of the coil mass.  Following the final coil there was near complete obliteration of the aneurysm.  The microcatheter was gently retrieved from the coil mass without any difficulty. A control arteriogram performed demonstrated near complete occlusion of the aneurysm with wide patency of the Lvis stent across the neck of the aneurysm.  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  Using a torque device, access through the nearly completely occluded left middle cerebral artery was achieved with the micro guidewire which was advanced to the distal M2 M3 region of the inferior division. The balloon was then advanced without difficulty and positioned such that the proximal and the distal markers were adequate distant and covered the segmental high-grade stenosis.  A control angioplasty was then performed using micro inflation syringe device via micro tubing. Slow increments were performed in the atmospheric pressures of the balloon to 4.9 atmospheres achieving approximately 1.95 mm diameter of the balloon. This was maintained for approximately 90 seconds. The balloon was then gently deflated and retrieved proximally whilst the wire was  maintained distally in the M2 M3 region.  A control arteriogram performed through the 5 [place] Catalyst guide catheter demonstrated significantly improved caliber and flow through the angioplastied segment. The Gateway balloon was then advanced to the M2 M3 region over the micro guidewire. The micro guidewire was then retrieved. Free aspiration of blood was noted from the hub of the Gateway microcatheter.  This was in turn exchanged for a 014 inch 300 cm Softip Transend exchange micro guidewire using biplane roadmap technique and constant fluoroscopic guidance. The micro guidewire had a J shaped configuration to avoid dissections or inducing spasm.  A control arteriogram performed through the Catalyst guide catheter in the left internal carotid artery following the exchange micro guidewire placement demonstrated significantly improved caliber and flow through the angioplastied segment to approximately 60-70% patency.  It was elected to proceed with placement of a Wingspan stent across the angioplastied segment of the left middle cerebral artery.  A 3.5 mm x 20 mm Wingspan system was then prepped and  purged with heparinized saline infusion in its housing.  The inner core was purged with heparinized saline infusion. Also the delivery microcatheter was purged with heparinized saline infusion and retrogradely via a Tuohy Borst and a 3 way stopcock to ensure absence of any air.  The system was then advanced as a unit over the exchange micro guidewire without difficulty.  The distal and the proximal markers of the stent were then advanced such that they were adequate distant at the site of the angioplastied segment.  The Tuohy Borst at the hub of the microcatheter was then loosened. The inner core was then advanced with its butt at the proximal portion of the stent. Thereafter, whilst holding the inner core of the stent, the delivery microcatheter was retrieved unsheathing the distal and then the proximal portion  of the stent. Once delivered, a control arteriogram performed through the 5 [place] Catalyst guide catheter demonstrated excellent apposition of the stent and coverage proximally and distally. Wide patency of the left middle cerebral artery angioplasty and stent segment was seen of almost 70-80%.  The delivery micro guidewire was then retrieved and removed.  Control arteriograms performed through the left internal carotid artery continued to demonstrate excellent flow through left MCA distribution without evidence of intraluminal filling defects or of occlusions involving the anterior or the middle cerebral artery distributions.  6 mg of intra-arterial Integrilin were given following the placement of the Wingspan stent system.  A control arteriogram performed through the 6 French Pinnacle sheath following removal of the Catalyst guide catheter now demonstrated the previously angioplastied segment of the left internal carotid artery proximally. The exchange micro guidewire had been maintained distally in the horizontal petrous segment.  Noted was modest recoil at the site of the previous angioplasty. It was, therefore, decided to proceed with placement of a stent across the angioplastied segment of the previously severely stenotic left internal carotid artery.  Over a 0.014 inch exchange micro guidewire, with the distal end in the petrous horizontal segment, measurements were performed of the proximal and the distal landing zones of the planned stent. After evaluating the measurements obtained, it was decided to proceed with placement of a 9-7 mm x 40 mm Xact stent. This was prepped and purged retrogradely with heparinized saline infusion. Using the rapid exchange technique, the stent delivery system was advanced without difficulty and positioned such that the distal and the proximal markers were adequate distant from the site of the previous angioplasty segment. This stent was then deployed without any  difficulty in the usual manner. The delivery catheter system was then retrieved and removed whilst the exchange micro guidewire was maintained distally.  A control arteriogram performed through the 6 French Pinnacle sheath immediately and 10 and 20 minutes placement of the stent continued to demonstrate excellent flow through the stented segment with mild spasm at its distal end. Because of the risk of development of platelet aggregation within the stent, it was decided to give additional 6 mg of Integrilin intra-arterially.  A final control arteriogram performed approximately 25 minutes following placement of the left internal carotid artery stent continued to demonstrate excellent flow intracranially and extra cranially. 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 The left middle cerebral artery angioplasty in the stented segment now demonstrated close to 80% patency without gross evidence of intraluminal filling defects or of occlusions.  Throughout the procedure, the patient's blood pressure and neurological status remained stable. The  patient's ACT was maintained in  the region of approximately 130 seconds throughout the procedure.  A flat panel CT of the brain performed with the patient on the table demonstrated no evidence of intracranial hemorrhage, mass effect or midline shift.  The patient was then returned to the OR for removal of the 6 French Pinnacle sheath by vascular surgery as per plan.  It was planned to a keep the patient intubated on ventilator for airway protection given the antiplatelets and anticoagulation given for the entirety of the procedure.  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  Status post angioplasty followed by stenting of the left middle cerebral artery severe symptomatic stenosis with 80%  patency post angioplasty and stenting.  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  PLAN: Patient to remain intubated for airway protection given the direct left carotid artery access and use of antiplatelets, and anticoagulation.   Electronically Signed   By: Luanne Bras M.D.   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  EXAM: TRANSCATHETER THERAPY EMBOLIZATION  COMPARISON:  Diagnostic catheter arteriogram of March 21, 2019.  MEDICATIONS: Heparin 1000 units IV. Ancef 4 g IV antibiotic was administered within 1 hour of the procedure.  ANESTHESIA/SEDATION: General anesthesia.  CONTRAST:  Isovue 300 approximately 150 mL.  FLUOROSCOPY TIME:  Fluoroscopy Time: 101 minutes 0 seconds (4155 mGy).  COMPLICATIONS: None immediate.  TECHNIQUE: Informed written consent was obtained from the patient after a thorough discussion of the procedural risks, benefits and alternatives. All questions were addressed. Maximal Sterile Barrier Technique was utilized including caps, mask, sterile gowns, sterile gloves, sterile drape, hand hygiene and skin antiseptic. A timeout was performed prior to the initiation of the procedure.  Direct access to the left common carotid artery was provided by vascular surgery in the OR due to patient's severe peripheral vascular disease, and absent flow in the right radial artery.  A 6 French Pinnacle sheath was inserted and sutured in the left common carotid artery in the mid section. The  patient was brought to the angio suite with the 6 French Pinnacle sheath.  This was then connected to continuous heparinized saline infusion following flushing. An arteriogram was then obtained of the left common carotid bifurcation and also intracranially.  FINDINGS: The left common carotid bifurcation again demonstrates the left external carotid artery and its major branches to be widely patent.  The left internal carotid artery just distal to the bulb again demonstrates a significant stenosis just distal to the carotid bulb of approximately 70-75%.  More distally, the left internal carotid artery is seen to opacify to the cranial skull base.  The petrous segment is widely patent.  There is a mild to moderate stenosis of the distal cavernous segment of the left internal carotid artery and to a lesser degree of the supraclinoid left internal carotid artery. A left posterior communicating artery is seen opacifying the left posterior cerebral artery distribution.  The left middle cerebral artery again demonstrates a severe M1 segment stenosis with opacification of the left MCA trifurcation branches  into the capillary and venous phases.  The left anterior cerebral artery demonstrates mild arteriosclerotic changes in the proximal A1 and A2 segments.  More distally, mild arteriosclerotic changes are also noted in the A3 segment of the left anterior cerebral artery pericallosal branch.  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  The subsequent capillary and venous phases are unremarkable.  ENDOVASCULAR TREATMENT OF THE LEFT ANTERIOR CEREBRAL ARTERY PERICALLOSAL REGION ANEURYSM WITH STENT ASSISTED COILING  Initial treatment angioplasty of the left internal carotid approximately was performed following advancement of a 5 mm x 30 mm Viatrac 14 angioplasty microcatheter. This had been prepped and purged with heparinized saline  infusion. Over a 014 inch standard Synchro micro guidewire with a J configuration, the combination of the Viatrac 14 angioplasty balloon catheter in the micro guidewire advanced to the proximal left internal carotid artery.  Using a torque device, the micro guidewire was advanced without difficulty to the distal cervical segment of the left internal carotid artery.  This was then followed by the advancement of the Viatrac 5 x 30 mm balloon in place adequate distance at the site of severe stenosis.  Slow control angioplasty was then performed using micro inflation syringe device via micro tubing. Balloon was inflated to 8.3 atmospheres where it was maintained for approximately 1 minute. The balloon was then deflated and advanced more distally. A control arteriogram performed through 5 French Pinnacle sheath in the left common carotid artery again demonstrated significantly improved caliber and flow through the angioplastied segment.  The combination of the balloon and the microcatheter were advanced to the horizontal petrous segment of the left internal carotid artery.  The micro guidewire was removed, and the Viatrac balloon was then exchanged for an 014 inch 300 cm Softip Transend exchange guidewire. Over the exchange micro guidewire, a 5 French 115 cm Catalyst guide catheter was advanced and positioned in the horizontal petrous segment of the left internal carotid artery.  The exchange micro guidewire was then removed. Good aspiration was obtained from the hub of the 5 [place] Catalyst guide catheter in the left internal carotid artery. A control arteriogram performed through the 5 [place] Catalyst guide catheter in the left internal carotid artery demonstrated no changes in the intracranial circulation.  Over a 0.014 inch standard Synchro micro guidewire with a J configuration, a Headway 17 2 tip microcatheter was advanced without difficulty to the supraclinoid left ICA.  The micro guidewire was then gently  manipulated with a torque device and advanced through left anterior cerebral artery A1 A2 segments followed by the microcatheter to the A3 segment of the anterior cerebral artery pericallosal branch. The wire was then advanced without difficulty distal to the neck of the aneurysm followed by the microcatheter. The guidewire was removed. Good aspiration obtained from the hub of microcatheter. A gentle control arteriogram performed through the microcatheter demonstrates safe position of tip of the microcatheter. This was then connected to continuous heparinized saline infusion. At this time after having obtained measurements of the pericallosal artery distal and proximal to the aneurysm, a 2.5 mm x 23 mm Lvis junior stent was advanced to the distal end of the microcatheter.  Under constant fluoroscopic guidance using roadmap technique, the delivery microcatheter was retrieved unsheathing the distal and then the proximal Lvis stent. A control arteriogram performed through the 5 [place] Catalyst guide catheter now advanced to the A1 segment of the left anterior cerebral artery  demonstrates safe positioning and excellent apposition of the Lvis stent. At this time, the 014 inch standard Synchro micro guidewire was then advanced through the microcatheter into the aneurysm without difficulty followed by the microcatheter. The guidewire was removed. Good aspiration was obtained with the intra aneurysmal microcatheter. This was then connected to continuous heparinized saline infusion.  The first coil utilized for aneurysm embolization was a Microplex 10 5 mm x 15 mm HyperSoft 3D coil. This was advanced using biplane roadmap technique and constant fluoroscopic guidance in a coaxial manner and with constant heparinized saline infusion to the distal end of the intra-aneurysmal microcatheter. This coil was then advanced under constant fluoroscopic guidance. Prior to its detachment, a control arteriogram performed through the 5  [place] Catalyst guide catheter in the left anterior cerebral artery demonstrates safe positioning of the coil. This was then detached without difficulty. This was subsequently followed by advancement of a 4 mm x 8 mm HyperSoft 3D coil, a 2.5 mm x 8 cm HyperSoft 3D coil, a 3 mm x 6 cm HyperSoft 3D coil, a 3 mm x 4 cm Microplex HyperSoft 3D coil and finally a 2 mm x 6 cm Microplex HyperSoft helical coil. Each of these coils was advanced into the aneurysm under fluoroscopic guidance using biplane roadmap technique. Prior to their detachment, a control arteriogram was performed through the guide catheter to ensure safe positioning of the coil mass.  Following the final coil there was near complete obliteration of the aneurysm.  The microcatheter was gently retrieved from the coil mass without any difficulty. A control arteriogram performed demonstrated near complete occlusion of the aneurysm with wide patency of the Lvis stent across the neck of the aneurysm.  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  Using a torque device, access through the nearly completely occluded left middle cerebral artery was achieved with the micro guidewire which was advanced to the distal M2 M3 region of the inferior division. The balloon was then advanced without difficulty and positioned such that the proximal and the distal markers were adequate distant and covered the segmental high-grade stenosis.  A control angioplasty was then performed using micro inflation syringe device via micro tubing. Slow increments were performed in the  atmospheric pressures of the balloon to 4.9 atmospheres achieving approximately 1.95 mm diameter of the balloon. This was maintained for approximately 90 seconds. The balloon was then gently deflated and retrieved proximally whilst the wire was maintained distally in the M2 M3 region.  A control arteriogram performed through the 5 [place] Catalyst guide catheter demonstrated significantly improved caliber and flow through the angioplastied segment. The Gateway balloon was then advanced to the M2 M3 region over the micro guidewire. The micro guidewire was then retrieved. Free aspiration of blood was noted from the hub of the Gateway microcatheter.  This was in turn exchanged for a 014 inch 300 cm Softip Transend exchange micro guidewire using biplane roadmap technique and constant fluoroscopic guidance. The micro guidewire had a J shaped configuration to avoid dissections or inducing spasm.  A control arteriogram performed through the Catalyst guide catheter in the left internal carotid artery  following the exchange micro guidewire placement demonstrated significantly improved caliber and flow through the angioplastied segment to approximately 60-70% patency.  It was elected to proceed with placement of a Wingspan stent across the angioplastied segment of the left middle cerebral artery.  A 3.5 mm x 20 mm Wingspan system was then prepped and purged with heparinized saline infusion in its housing.  The inner core was purged with heparinized saline infusion. Also the delivery microcatheter was purged with heparinized saline infusion and retrogradely via a Tuohy Borst and a 3 way stopcock to ensure absence of any air.  The system was then advanced as a unit over the exchange micro guidewire without difficulty.  The distal and the proximal markers of the stent were then advanced such that they were adequate distant at the site of the angioplastied segment.  The Tuohy Borst at the hub of the microcatheter was then  loosened. The inner core was then advanced with its butt at the proximal portion of the stent. Thereafter, whilst holding the inner core of the stent, the delivery microcatheter was retrieved unsheathing the distal and then the proximal portion of the stent. Once delivered, a control arteriogram performed through the 5 [place] Catalyst guide catheter demonstrated excellent apposition of the stent and coverage proximally and distally. Wide patency of the left middle cerebral artery angioplasty and stent segment was seen of almost 70-80%.  The delivery micro guidewire was then retrieved and removed.  Control arteriograms performed through the left internal carotid artery continued to demonstrate excellent flow through left MCA distribution without evidence of intraluminal filling defects or of occlusions involving the anterior or the middle cerebral artery distributions.  6 mg of intra-arterial Integrilin were given following the placement of the Wingspan stent system.  A control arteriogram performed through the 6 [place] Pinnacle sheath following removal of the Catalyst guide catheter now demonstrated the previously angioplastied segment of the left internal carotid artery proximally. The exchange micro guidewire had been maintained distally in the horizontal petrous segment.  Noted was modest recoil at the site of the previous angioplasty. It was, therefore, decided to proceed with placement of a stent across the angioplastied segment of the previously severely stenotic left internal carotid artery.  Over a 0.014 inch exchange micro guidewire, with the distal end in the petrous horizontal segment, measurements were performed of the proximal and the distal landing zones of the planned stent. After evaluating the measurements obtained, it was decided to proceed with placement of a 9-7 mm x 40 mm Xact stent. This was prepped and purged retrogradely with heparinized saline infusion. Using the rapid exchange  technique, the stent delivery system was advanced without difficulty and positioned such that the distal and the proximal markers were adequate distant from the site of the previous angioplasty segment. This stent was then deployed without any difficulty in the usual manner. The delivery catheter system was then retrieved and removed whilst the exchange micro guidewire was maintained distally.  A control arteriogram performed through the 6 [place] Pinnacle sheath immediately and 10 and 20 minutes placement of the stent continued to demonstrate excellent flow through the stented segment with mild spasm at its distal end. Because of the risk of development of platelet aggregation within the stent, it was decided to give additional 6 mg of Integrilin intra-arterially.  A final control arteriogram performed approximately 25 minutes following placement of the left internal carotid artery stent continued to demonstrate excellent flow intracranially and extra cranially. The left pericallosal artery aneurysm  appeared nearly completely obliterated with patency of the stent. The left middle cerebral artery angioplasty in the stented segment now demonstrated close to 80% patency without gross evidence of intraluminal filling defects or of occlusions.  Throughout the procedure, the patient's blood pressure and neurological status remained stable. The  patient's ACT was maintained in the region of approximately 130 seconds throughout the procedure.  A flat panel CT of the brain performed with the patient on the table demonstrated no evidence of intracranial hemorrhage, mass effect or midline shift.  The patient was then returned to the OR for removal of the 6 [place] Pinnacle sheath by vascular surgery as per plan.  It was planned to a keep the patient intubated on ventilator for airway protection given the antiplatelets and anticoagulation given for the entirety of the procedure.  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  PLAN: Patient to remain intubated for airway protection given the direct left carotid artery access and use of antiplatelets, and anticoagulation.   Electronically Signed   By: Luanne Bras M.D.   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  EXAM: TRANSCATHETER THERAPY EMBOLIZATION  COMPARISON:  Diagnostic catheter arteriogram of March 21, 2019.  MEDICATIONS: Heparin 1000 units IV. Ancef 4 g IV antibiotic was administered within 1 hour of the procedure.  ANESTHESIA/SEDATION: General anesthesia.  CONTRAST:  Isovue 300 approximately 150 mL.  FLUOROSCOPY TIME:  Fluoroscopy Time: 101 minutes 0 seconds (4155 mGy).  COMPLICATIONS: None immediate.  TECHNIQUE: Informed written consent was obtained from the patient after a thorough discussion of the procedural risks, benefits and alternatives. All questions were addressed. Maximal Sterile Barrier Technique was utilized including caps, mask, sterile gowns, sterile gloves, sterile drape, hand hygiene and skin antiseptic. A timeout was performed prior to the initiation of the procedure.  Direct access to the left common carotid artery was  provided by vascular surgery in the OR due to patient's severe peripheral vascular disease, and absent flow in the right radial artery.  A 6 French Pinnacle sheath was inserted and sutured in the left common carotid artery in the mid section. The patient was brought to the angio suite with the 6 French Pinnacle sheath.  This was then connected to continuous heparinized saline infusion following flushing. An arteriogram was then obtained of the left common carotid bifurcation and also intracranially.  FINDINGS: The left common carotid bifurcation again demonstrates the left external carotid artery and its major branches to be widely patent.  The left internal carotid artery just distal to the bulb again demonstrates a significant stenosis just distal to the carotid bulb of approximately 70-75%.  More distally, the left internal carotid artery is seen to opacify to the cranial skull base.  The petrous segment is widely patent.  There is a mild to moderate stenosis of the distal  cavernous segment of the left internal carotid artery and to a lesser degree of the supraclinoid left internal carotid artery. A left posterior communicating artery is seen opacifying the left posterior cerebral artery distribution.  The left middle cerebral artery again demonstrates a severe M1 segment stenosis with opacification of the left MCA trifurcation branches into the capillary and venous phases.  The left anterior cerebral artery demonstrates mild arteriosclerotic changes in the proximal A1 and A2 segments.  More distally, mild arteriosclerotic changes are also noted in the A3 segment of the left anterior cerebral artery pericallosal branch.  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  The subsequent capillary and venous phases are unremarkable.  ENDOVASCULAR TREATMENT OF THE LEFT ANTERIOR CEREBRAL ARTERY PERICALLOSAL REGION ANEURYSM WITH  STENT ASSISTED COILING  Initial treatment angioplasty of the left internal carotid approximately was performed following advancement of a 5 mm x 30 mm Viatrac 14 angioplasty microcatheter. This had been prepped and purged with heparinized saline infusion. Over a 014 inch standard Synchro micro guidewire with a J configuration, the combination of the Viatrac 14 angioplasty balloon catheter in the micro guidewire advanced to the proximal left internal carotid artery.  Using a torque device, the micro guidewire was advanced without difficulty to the distal cervical segment of the left internal carotid artery.  This was then followed by the advancement of the Viatrac 5 x 30 mm balloon in place adequate distance at the site of severe stenosis.  Slow control angioplasty was then performed using micro inflation syringe device via micro tubing. Balloon was inflated to 8.3 atmospheres where it was maintained for approximately 1 minute. The balloon was then deflated and advanced more distally. A control arteriogram performed through 5 French Pinnacle sheath in the left common carotid artery again demonstrated significantly improved caliber and flow through the angioplastied segment.  The combination of the balloon and the microcatheter were advanced to the horizontal petrous segment of the left internal carotid artery.  The micro guidewire was removed, and the Viatrac balloon was then exchanged for an 014 inch 300 cm Softip Transend exchange guidewire. Over the exchange micro guidewire, a 5 French 115 cm Catalyst guide catheter was advanced and positioned in the horizontal petrous segment of the left internal carotid artery.  The exchange micro guidewire was then removed. Good aspiration was obtained from the hub of the 5 [place] Catalyst guide catheter in the left internal carotid artery. A control arteriogram performed through the 5 [place] Catalyst guide catheter in the left internal carotid artery demonstrated no  changes in the intracranial circulation.  Over a 0.014 inch standard Synchro micro guidewire with a J configuration, a Headway 17 2 tip microcatheter was advanced without difficulty to the supraclinoid left ICA.  The micro guidewire was then gently manipulated with a torque device and advanced through left anterior cerebral artery A1 A2 segments followed by the microcatheter to the A3 segment of the anterior cerebral artery pericallosal branch. The wire was then advanced without difficulty distal to the neck of the aneurysm followed by the microcatheter. The guidewire was removed. Good aspiration obtained from the hub of microcatheter. A gentle control arteriogram performed through the microcatheter demonstrates safe position of tip of the microcatheter. This was then connected to continuous heparinized saline infusion. At this time after having obtained measurements of the pericallosal artery distal and proximal to the aneurysm, a 2.5 mm x 23 mm Lvis junior  stent was advanced to the distal end of the microcatheter.  Under constant fluoroscopic guidance using roadmap technique, the delivery microcatheter was retrieved unsheathing the distal and then the proximal Lvis stent. A control arteriogram performed through the 5 [place] Catalyst guide catheter now advanced to the A1 segment of the left anterior cerebral artery demonstrates safe positioning and excellent apposition of the Lvis stent. At this time, the 014 inch standard Synchro micro guidewire was then advanced through the microcatheter into the aneurysm without difficulty followed by the microcatheter. The guidewire was removed. Good aspiration was obtained with the intra aneurysmal microcatheter. This was then connected to continuous heparinized saline infusion.  The first coil utilized for aneurysm embolization was a Microplex 10 5 mm x 15 mm HyperSoft 3D coil. This was advanced using biplane roadmap technique and constant fluoroscopic guidance in a  coaxial manner and with constant heparinized saline infusion to the distal end of the intra-aneurysmal microcatheter. This coil was then advanced under constant fluoroscopic guidance. Prior to its detachment, a control arteriogram performed through the 5 [place] Catalyst guide catheter in the left anterior cerebral artery demonstrates safe positioning of the coil. This was then detached without difficulty. This was subsequently followed by advancement of a 4 mm x 8 mm HyperSoft 3D coil, a 2.5 mm x 8 cm HyperSoft 3D coil, a 3 mm x 6 cm HyperSoft 3D coil, a 3 mm x 4 cm Microplex HyperSoft 3D coil and finally a 2 mm x 6 cm Microplex HyperSoft helical coil. Each of these coils was advanced into the aneurysm under fluoroscopic guidance using biplane roadmap technique. Prior to their detachment, a control arteriogram was performed through the guide catheter to ensure safe positioning of the coil mass.  Following the final coil there was near complete obliteration of the aneurysm.  The microcatheter was gently retrieved from the coil mass without any difficulty. A control arteriogram performed demonstrated near complete occlusion of the aneurysm with wide patency of the Lvis stent across the neck of the aneurysm.  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  Using a torque device, access through the nearly completely occluded left middle cerebral artery was achieved with the micro guidewire which was advanced to the distal M2 M3 region of the inferior division. The balloon was then advanced without difficulty and  positioned such that the proximal and the distal markers were adequate distant and covered the segmental high-grade stenosis.  A control angioplasty was then performed using micro inflation syringe device via micro tubing. Slow increments were performed in the atmospheric pressures of the balloon to 4.9 atmospheres achieving approximately 1.95 mm diameter of the balloon. This was maintained for approximately 90 seconds. The balloon was then gently deflated and retrieved proximally whilst the wire was maintained distally in the M2 M3 region.  A control arteriogram performed through the 5 [place] Catalyst guide catheter demonstrated significantly improved caliber and flow through the angioplastied segment. The Gateway balloon was then advanced to the M2 M3 region over the micro guidewire. The micro guidewire was then retrieved. Free aspiration of blood was noted from the hub of the Gateway microcatheter.  This was in turn exchanged for a 014 inch 300 cm Softip Transend exchange micro guidewire using biplane roadmap technique and constant fluoroscopic guidance. The micro guidewire had a J shaped configuration to avoid dissections or inducing spasm.  A control arteriogram performed through the Catalyst guide catheter in the left internal carotid artery following the exchange micro guidewire placement demonstrated significantly improved caliber and flow through the angioplastied segment to approximately 60-70% patency.  It was elected to proceed with placement of a Wingspan stent across the angioplastied segment of the left middle cerebral artery.  A 3.5 mm x 20 mm Wingspan system was then prepped and purged with heparinized saline infusion in its housing.  The inner core was purged with heparinized saline infusion. Also the delivery microcatheter was purged with heparinized saline infusion and retrogradely via a Tuohy Borst and a 3 way stopcock to ensure absence of any air.  The system was then advanced as a unit  over the exchange micro guidewire without difficulty.  The distal and the proximal markers of the stent were then advanced such that they were adequate distant at the site of the angioplastied segment.  The Tuohy Borst at the hub of the microcatheter was then loosened. The inner core was then advanced with its butt at the proximal portion of the stent. Thereafter, whilst holding the inner core of the stent, the delivery microcatheter was retrieved unsheathing the distal and then the proximal portion of the stent. Once delivered, a control arteriogram performed through the 5 [place] Catalyst guide catheter demonstrated excellent apposition of the stent and coverage proximally and distally. Wide patency of the left middle cerebral artery angioplasty and stent segment was seen of almost 70-80%.  The delivery micro guidewire was then retrieved and removed.  Control arteriograms performed through the left internal carotid artery continued to demonstrate excellent flow through left MCA distribution without evidence of intraluminal filling defects or of occlusions involving the anterior or the middle cerebral artery distributions.  6 mg of intra-arterial Integrilin were given following the placement of the Wingspan stent system.  A control arteriogram performed through the 6 French Pinnacle sheath following removal of the Catalyst guide catheter now demonstrated the previously angioplastied segment of the left internal carotid artery proximally. The exchange micro guidewire had been maintained distally in the horizontal petrous segment.  Noted was modest recoil at the site of the previous angioplasty. It was, therefore, decided to proceed with placement of a stent across the angioplastied segment of the previously severely stenotic left internal carotid artery.  Over a 0.014 inch exchange micro guidewire, with the distal end in the petrous horizontal segment, measurements were performed of the proximal and the  distal landing zones of the planned stent. After evaluating the measurements obtained, it was decided to proceed with placement of a 9-7 mm x 40 mm Xact stent. This was prepped and purged retrogradely with heparinized saline infusion. Using the rapid exchange technique, the stent delivery system was advanced without difficulty and positioned such that the distal and the proximal markers were adequate distant from the site of the previous angioplasty segment. This stent was then deployed without any difficulty in the usual manner. The delivery catheter system was then retrieved and removed whilst the exchange micro guidewire was maintained distally.  A control arteriogram performed through the 6 French Pinnacle sheath immediately and 10 and 20 minutes placement of the stent continued to demonstrate excellent flow through the stented segment with mild spasm at its distal end. Because  of the risk of development of platelet aggregation within the stent, it was decided to give additional 6 mg of Integrilin intra-arterially.  A final control arteriogram performed approximately 25 minutes following placement of the left internal carotid artery stent continued to demonstrate excellent flow intracranially and extra cranially. 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 The left middle cerebral artery angioplasty in the stented segment now demonstrated close to 80% patency without gross evidence of intraluminal filling defects or of occlusions.  Throughout the procedure, the patient's blood pressure and neurological status remained stable. The  patient's ACT was maintained in the region of approximately 130 seconds throughout the procedure.  A flat panel CT of the brain performed with the patient on the table demonstrated no evidence of intracranial hemorrhage, mass effect or midline shift.  The patient was then returned to the OR for removal of the 6 French  Pinnacle sheath by vascular surgery as per plan.  It was planned to a keep the patient intubated on ventilator for airway protection given the antiplatelets and anticoagulation given for the entirety of the procedure.  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  PLAN: Patient to remain intubated for airway protection given the direct left carotid artery access and use of antiplatelets, and anticoagulation.   Electronically Signed   By: Luanne Bras M.D.   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  EXAM: TRANSCATHETER THERAPY EMBOLIZATION  COMPARISON:  Diagnostic catheter arteriogram of March 21, 2019.  MEDICATIONS: Heparin 1000 units IV. Ancef 4 g IV antibiotic was administered within 1 hour of the procedure.  ANESTHESIA/SEDATION: General anesthesia.  CONTRAST:  Isovue 300 approximately 150 mL.  FLUOROSCOPY TIME:  Fluoroscopy Time: 101 minutes 0 seconds (4155 mGy).  COMPLICATIONS: None immediate.  TECHNIQUE: Informed written consent was obtained from the patient after a thorough discussion of the procedural risks, benefits and alternatives. All questions were addressed. Maximal  Sterile Barrier Technique was utilized including caps, mask, sterile gowns, sterile gloves, sterile drape, hand hygiene and skin antiseptic. A timeout was performed prior to the initiation of the procedure.  Direct access to the left common carotid artery was provided by vascular surgery in the OR due to patient's severe peripheral vascular disease, and absent flow in the right radial artery.  A 6 French Pinnacle sheath was inserted and sutured in the left common carotid artery in the mid section. The patient was brought to the angio suite with the 6 French Pinnacle sheath.  This was then connected to continuous heparinized saline infusion following flushing. An arteriogram was then obtained of the left common carotid bifurcation and also intracranially.  FINDINGS: The left common carotid bifurcation again demonstrates the left external carotid artery and its major branches to be widely patent.  The left internal carotid artery  just distal to the bulb again demonstrates a significant stenosis just distal to the carotid bulb of approximately 70-75%.  More distally, the left internal carotid artery is seen to opacify to the cranial skull base.  The petrous segment is widely patent.  There is a mild to moderate stenosis of the distal cavernous segment of the left internal carotid artery and to a lesser degree of the supraclinoid left internal carotid artery. A left posterior communicating artery is seen opacifying the left posterior cerebral artery distribution.  The left middle cerebral artery again demonstrates a severe M1 segment stenosis with opacification of the left MCA trifurcation branches into the capillary and venous phases.  The left anterior cerebral artery demonstrates mild arteriosclerotic changes in the proximal A1 and A2 segments.  More distally, mild arteriosclerotic changes are also noted in the A3 segment of the left anterior cerebral artery pericallosal branch.  Seen is a large lobulated  saccular aneurysm arising at the junction of the A2 A3 region of the left anterior cerebral artery.  The subsequent capillary and venous phases are unremarkable.  ENDOVASCULAR TREATMENT OF THE LEFT ANTERIOR CEREBRAL ARTERY PERICALLOSAL REGION ANEURYSM WITH STENT ASSISTED COILING  Initial treatment angioplasty of the left internal carotid approximately was performed following advancement of a 5 mm x 30 mm Viatrac 14 angioplasty microcatheter. This had been prepped and purged with heparinized saline infusion. Over a 014 inch standard Synchro micro guidewire with a J configuration, the combination of the Viatrac 14 angioplasty balloon catheter in the micro guidewire advanced to the proximal left internal carotid artery.  Using a torque device, the micro guidewire was advanced without difficulty to the distal cervical segment of the left internal carotid artery.  This was then followed by the advancement of the Viatrac 5 x 30 mm balloon in place adequate distance at the site of severe stenosis.  Slow control angioplasty was then performed using micro inflation syringe device via micro tubing. Balloon was inflated to 8.3 atmospheres where it was maintained for approximately 1 minute. The balloon was then deflated and advanced more distally. A control arteriogram performed through 5 French Pinnacle sheath in the left common carotid artery again demonstrated significantly improved caliber and flow through the angioplastied segment.  The combination of the balloon and the microcatheter were advanced to the horizontal petrous segment of the left internal carotid artery.  The micro guidewire was removed, and the Viatrac balloon was then exchanged for an 014 inch 300 cm Softip Transend exchange guidewire. Over the exchange micro guidewire, a 5 French 115 cm Catalyst guide catheter was advanced and positioned in the horizontal petrous segment of the left internal carotid artery.  The exchange micro guidewire was then  removed. Good aspiration was obtained from the hub of the 5 [place] Catalyst guide catheter in the left internal carotid artery. A control arteriogram performed through the 5 [place] Catalyst guide catheter in the left internal carotid artery demonstrated no changes in the intracranial circulation.  Over a 0.014 inch standard Synchro micro guidewire with a J configuration, a Headway 17 2 tip microcatheter was advanced without difficulty to the supraclinoid left ICA.  The micro guidewire was then gently manipulated with a torque device and advanced through left anterior cerebral artery A1 A2 segments followed by the microcatheter to the A3 segment of the anterior cerebral artery pericallosal branch. The wire was then advanced without difficulty distal to the neck of the aneurysm followed by the microcatheter. The guidewire was removed. Good aspiration obtained from  the hub of microcatheter. A gentle control arteriogram performed through the microcatheter demonstrates safe position of tip of the microcatheter. This was then connected to continuous heparinized saline infusion. At this time after having obtained measurements of the pericallosal artery distal and proximal to the aneurysm, a 2.5 mm x 23 mm Lvis junior stent was advanced to the distal end of the microcatheter.  Under constant fluoroscopic guidance using roadmap technique, the delivery microcatheter was retrieved unsheathing the distal and then the proximal Lvis stent. A control arteriogram performed through the 5 [place] Catalyst guide catheter now advanced to the A1 segment of the left anterior cerebral artery demonstrates safe positioning and excellent apposition of the Lvis stent. At this time, the 014 inch standard Synchro micro guidewire was then advanced through the microcatheter into the aneurysm without difficulty followed by the microcatheter. The guidewire was removed. Good aspiration was obtained with the intra aneurysmal microcatheter. This was  then connected to continuous heparinized saline infusion.  The first coil utilized for aneurysm embolization was a Microplex 10 5 mm x 15 mm HyperSoft 3D coil. This was advanced using biplane roadmap technique and constant fluoroscopic guidance in a coaxial manner and with constant heparinized saline infusion to the distal end of the intra-aneurysmal microcatheter. This coil was then advanced under constant fluoroscopic guidance. Prior to its detachment, a control arteriogram performed through the 5 [place] Catalyst guide catheter in the left anterior cerebral artery demonstrates safe positioning of the coil. This was then detached without difficulty. This was subsequently followed by advancement of a 4 mm x 8 mm HyperSoft 3D coil, a 2.5 mm x 8 cm HyperSoft 3D coil, a 3 mm x 6 cm HyperSoft 3D coil, a 3 mm x 4 cm Microplex HyperSoft 3D coil and finally a 2 mm x 6 cm Microplex HyperSoft helical coil. Each of these coils was advanced into the aneurysm under fluoroscopic guidance using biplane roadmap technique. Prior to their detachment, a control arteriogram was performed through the guide catheter to ensure safe positioning of the coil mass.  Following the final coil there was near complete obliteration of the aneurysm.  The microcatheter was gently retrieved from the coil mass without any difficulty. A control arteriogram performed demonstrated near complete occlusion of the aneurysm with wide patency of the Lvis stent across the neck of the aneurysm.  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  Using a torque  device, access through the nearly completely occluded left middle cerebral artery was achieved with the micro guidewire which was advanced to the distal M2 M3 region of the inferior division. The balloon was then advanced without difficulty and positioned such that the proximal and the distal markers were adequate distant and covered the segmental high-grade stenosis.  A control angioplasty was then performed using micro inflation syringe device via micro tubing. Slow increments were performed in the atmospheric pressures of the balloon to 4.9 atmospheres achieving approximately 1.95 mm diameter of the balloon. This was maintained for approximately 90 seconds. The balloon was then gently deflated and retrieved proximally whilst the wire was maintained distally in the M2 M3 region.  A  control arteriogram performed through the 5 [place] Catalyst guide catheter demonstrated significantly improved caliber and flow through the angioplastied segment. The Gateway balloon was then advanced to the M2 M3 region over the micro guidewire. The micro guidewire was then retrieved. Free aspiration of blood was noted from the hub of the Gateway microcatheter.  This was in turn exchanged for a 014 inch 300 cm Softip Transend exchange micro guidewire using biplane roadmap technique and constant fluoroscopic guidance. The micro guidewire had a J shaped configuration to avoid dissections or inducing spasm.  A control arteriogram performed through the Catalyst guide catheter in the left internal carotid artery following the exchange micro guidewire placement demonstrated significantly improved caliber and flow through the angioplastied segment to approximately 60-70% patency.  It was elected to proceed with placement of a Wingspan stent across the angioplastied segment of the left middle cerebral artery.  A 3.5 mm x 20 mm Wingspan system was then prepped and purged with heparinized saline infusion in its housing.  The inner core  was purged with heparinized saline infusion. Also the delivery microcatheter was purged with heparinized saline infusion and retrogradely via a Tuohy Borst and a 3 way stopcock to ensure absence of any air.  The system was then advanced as a unit over the exchange micro guidewire without difficulty.  The distal and the proximal markers of the stent were then advanced such that they were adequate distant at the site of the angioplastied segment.  The Tuohy Borst at the hub of the microcatheter was then loosened. The inner core was then advanced with its butt at the proximal portion of the stent. Thereafter, whilst holding the inner core of the stent, the delivery microcatheter was retrieved unsheathing the distal and then the proximal portion of the stent. Once delivered, a control arteriogram performed through the 5 [place] Catalyst guide catheter demonstrated excellent apposition of the stent and coverage proximally and distally. Wide patency of the left middle cerebral artery angioplasty and stent segment was seen of almost 70-80%.  The delivery micro guidewire was then retrieved and removed.  Control arteriograms performed through the left internal carotid artery continued to demonstrate excellent flow through left MCA distribution without evidence of intraluminal filling defects or of occlusions involving the anterior or the middle cerebral artery distributions.  6 mg of intra-arterial Integrilin were given following the placement of the Wingspan stent system.  A control arteriogram performed through the 6 French Pinnacle sheath following removal of the Catalyst guide catheter now demonstrated the previously angioplastied segment of the left internal carotid artery proximally. The exchange micro guidewire had been maintained distally in the horizontal petrous segment.  Noted was modest recoil at the site of the previous angioplasty. It was, therefore, decided to proceed with placement of a stent across  the angioplastied segment of the previously severely stenotic left internal carotid artery.  Over a 0.014 inch exchange micro guidewire, with the distal end in the petrous horizontal segment, measurements were performed of the proximal and the distal landing zones of the planned stent. After evaluating the measurements obtained, it was decided to proceed with placement of a 9-7 mm x 40 mm Xact stent. This was prepped and purged retrogradely with heparinized saline infusion. Using the rapid exchange technique, the stent delivery system was advanced without difficulty and positioned such that the distal and the proximal markers were adequate distant from the site of the previous angioplasty segment. This stent was then deployed without any difficulty in the usual manner.  The delivery catheter system was then retrieved and removed whilst the exchange micro guidewire was maintained distally.  A control arteriogram performed through the 6 French Pinnacle sheath immediately and 10 and 20 minutes placement of the stent continued to demonstrate excellent flow through the stented segment with mild spasm at its distal end. Because of the risk of development of platelet aggregation within the stent, it was decided to give additional 6 mg of Integrilin intra-arterially.  A final control arteriogram performed approximately 25 minutes following placement of the left internal carotid artery stent continued to demonstrate excellent flow intracranially and extra cranially. 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 The left middle cerebral artery angioplasty in the stented segment now demonstrated close to 80% patency without gross evidence of intraluminal filling defects or of occlusions.  Throughout the procedure, the patient's blood pressure and neurological status remained stable. The  patient's ACT was maintained in the region of approximately 130 seconds  throughout the procedure.  A flat panel CT of the brain performed with the patient on the table demonstrated no evidence of intracranial hemorrhage, mass effect or midline shift.  The patient was then returned to the OR for removal of the 6 French Pinnacle sheath by vascular surgery as per plan.  It was planned to a keep the patient intubated on ventilator for airway protection given the antiplatelets and anticoagulation given for the entirety of the procedure.  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  PLAN: Patient to remain intubated for airway protection given the direct left carotid artery access and use of antiplatelets, and anticoagulation.   Electronically Signed   By: Luanne Bras M.D.   On: 03/28/2019 09:53   IR INTRAVSC STENT CERV CAROTID W/O EMB-PROT MOD SED  Result Date: 03/26/2019 CLINICAL DATA:  Symptomatic high-grade stenosis of the left middle cerebral artery proximal M1 segment with history of recent ischemic stroke.  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  EXAM: TRANSCATHETER THERAPY EMBOLIZATION  COMPARISON:  Diagnostic catheter arteriogram of March 21, 2019.  MEDICATIONS: Heparin 1000 units IV. Ancef 4 g IV antibiotic was administered within 1 hour of the procedure.  ANESTHESIA/SEDATION: General anesthesia.  CONTRAST:  Isovue 300 approximately 150 mL.  FLUOROSCOPY TIME:   Fluoroscopy Time: 101 minutes 0 seconds (4155 mGy).  COMPLICATIONS: None immediate.  TECHNIQUE: Informed written consent was obtained from the patient after a thorough discussion of the procedural risks, benefits and alternatives. All questions were addressed. Maximal Sterile Barrier Technique was utilized including caps, mask, sterile gowns, sterile gloves, sterile drape, hand hygiene and skin antiseptic. A timeout was performed prior to the initiation of the procedure.  Direct access to the left common carotid artery was provided by vascular surgery in the OR due to patient's severe peripheral vascular disease, and absent flow in the right radial artery.  A 6 French Pinnacle sheath was inserted and sutured in the left common carotid artery in the mid section. The patient was brought to the angio suite with  the 6 [place] Pinnacle sheath.  This was then connected to continuous heparinized saline infusion following flushing. An arteriogram was then obtained of the left common carotid bifurcation and also intracranially.  FINDINGS: The left common carotid bifurcation again demonstrates the left external carotid artery and its major branches to be widely patent.  The left internal carotid artery just distal to the bulb again demonstrates a significant stenosis just distal to the carotid bulb of approximately 70-75%.  More distally, the left internal carotid artery is seen to opacify to the cranial skull base.  The petrous segment is widely patent.  There is a mild to moderate stenosis of the distal cavernous segment of the left internal carotid artery and to a lesser degree of the supraclinoid left internal carotid artery. A left posterior communicating artery is seen opacifying the left posterior cerebral artery distribution.  The left middle cerebral artery again demonstrates a severe M1 segment stenosis with opacification of the left MCA trifurcation branches into the capillary and venous phases.  The  left anterior cerebral artery demonstrates mild arteriosclerotic changes in the proximal A1 and A2 segments.  More distally, mild arteriosclerotic changes are also noted in the A3 segment of the left anterior cerebral artery pericallosal branch.  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  The subsequent capillary and venous phases are unremarkable.  ENDOVASCULAR TREATMENT OF THE LEFT ANTERIOR CEREBRAL ARTERY PERICALLOSAL REGION ANEURYSM WITH STENT ASSISTED COILING  Initial treatment angioplasty of the left internal carotid approximately was performed following advancement of a 5 mm x 30 mm Viatrac 14 angioplasty microcatheter. This had been prepped and purged with heparinized saline infusion. Over a 014 inch standard Synchro micro guidewire with a J configuration, the combination of the Viatrac 14 angioplasty balloon catheter in the micro guidewire advanced to the proximal left internal carotid artery.  Using a torque device, the micro guidewire was advanced without difficulty to the distal cervical segment of the left internal carotid artery.  This was then followed by the advancement of the Viatrac 5 x 30 mm balloon in place adequate distance at the site of severe stenosis.  Slow control angioplasty was then performed using micro inflation syringe device via micro tubing. Balloon was inflated to 8.3 atmospheres where it was maintained for approximately 1 minute. The balloon was then deflated and advanced more distally. A control arteriogram performed through 5 French Pinnacle sheath in the left common carotid artery again demonstrated significantly improved caliber and flow through the angioplastied segment.  The combination of the balloon and the microcatheter were advanced to the horizontal petrous segment of the left internal carotid artery.  The micro guidewire was removed, and the Viatrac balloon was then exchanged  for an 014 inch 300 cm Softip Transend exchange guidewire. Over the exchange micro guidewire, a 5 French 115 cm Catalyst guide catheter was advanced and positioned in the horizontal petrous segment of the left internal carotid artery.  The exchange micro guidewire was then removed. Good aspiration was obtained from the hub of the 5 [place] Catalyst guide catheter in the left internal carotid artery. A control arteriogram performed through the 5 [place] Catalyst guide catheter in the left internal carotid artery demonstrated no changes in the intracranial circulation.  Over a 0.014 inch standard Synchro micro guidewire with a J configuration, a Headway 17 2 tip microcatheter was advanced without difficulty to the supraclinoid left ICA.  The micro guidewire  was then gently manipulated with a torque device and advanced through left anterior cerebral artery A1 A2 segments followed by the microcatheter to the A3 segment of the anterior cerebral artery pericallosal branch. The wire was then advanced without difficulty distal to the neck of the aneurysm followed by the microcatheter. The guidewire was removed. Good aspiration obtained from the hub of microcatheter. A gentle control arteriogram performed through the microcatheter demonstrates safe position of tip of the microcatheter. This was then connected to continuous heparinized saline infusion. At this time after having obtained measurements of the pericallosal artery distal and proximal to the aneurysm, a 2.5 mm x 23 mm Lvis junior stent was advanced to the distal end of the microcatheter.  Under constant fluoroscopic guidance using roadmap technique, the delivery microcatheter was retrieved unsheathing the distal and then the proximal Lvis stent. A control arteriogram performed through the 5 [place] Catalyst guide catheter now advanced to the A1 segment of the left anterior cerebral artery demonstrates safe positioning and excellent apposition of the Lvis stent. At  this time, the 014 inch standard Synchro micro guidewire was then advanced through the microcatheter into the aneurysm without difficulty followed by the microcatheter. The guidewire was removed. Good aspiration was obtained with the intra aneurysmal microcatheter. This was then connected to continuous heparinized saline infusion.  The first coil utilized for aneurysm embolization was a Microplex 10 5 mm x 15 mm HyperSoft 3D coil. This was advanced using biplane roadmap technique and constant fluoroscopic guidance in a coaxial manner and with constant heparinized saline infusion to the distal end of the intra-aneurysmal microcatheter. This coil was then advanced under constant fluoroscopic guidance. Prior to its detachment, a control arteriogram performed through the 5 [place] Catalyst guide catheter in the left anterior cerebral artery demonstrates safe positioning of the coil. This was then detached without difficulty. This was subsequently followed by advancement of a 4 mm x 8 mm HyperSoft 3D coil, a 2.5 mm x 8 cm HyperSoft 3D coil, a 3 mm x 6 cm HyperSoft 3D coil, a 3 mm x 4 cm Microplex HyperSoft 3D coil and finally a 2 mm x 6 cm Microplex HyperSoft helical coil. Each of these coils was advanced into the aneurysm under fluoroscopic guidance using biplane roadmap technique. Prior to their detachment, a control arteriogram was performed through the guide catheter to ensure safe positioning of the coil mass.  Following the final coil there was near complete obliteration of the aneurysm.  The microcatheter was gently retrieved from the coil mass without any difficulty. A control arteriogram performed demonstrated near complete occlusion of the aneurysm with wide patency of the Lvis stent across the neck of the aneurysm.  ENDOVASCULAR STENT ASSISTED ANGIOPLASTY OF SYMPTOMATIC HIGH-GRADE STENOSIS OF THE LEFT MIDDLE CEREBRAL ARTERY M1 SEGMENT  Over a 0.014 inch standard Synchro micro guidewire, a 2 mm x 15 mm  Gateway angioplasty balloon catheter which had been prepped with 50% contrast and 50% heparinized saline infusion was advanced through the 5 [place] Catalyst guide catheter in the petrous segment of the left internal carotid artery to the supraclinoid left ICA.  Using a torque device, access through the nearly completely occluded left middle cerebral artery was achieved with the micro guidewire which was advanced to the distal M2 M3 region of the inferior division. The balloon was then advanced without difficulty and positioned such that the proximal and the distal markers were adequate distant and covered the segmental high-grade stenosis.  A control angioplasty was then performed  using micro inflation syringe device via micro tubing. Slow increments were performed in the atmospheric pressures of the balloon to 4.9 atmospheres achieving approximately 1.95 mm diameter of the balloon. This was maintained for approximately 90 seconds. The balloon was then gently deflated and retrieved proximally whilst the wire was maintained distally in the M2 M3 region.  A control arteriogram performed through the 5 [place] Catalyst guide catheter demonstrated significantly improved caliber and flow through the angioplastied segment. The Gateway balloon was then advanced to the M2 M3 region over the micro guidewire. The micro guidewire was then retrieved. Free aspiration of blood was noted from the hub of the Gateway microcatheter.  This was in turn exchanged for a 014 inch 300 cm Softip Transend exchange micro guidewire using biplane roadmap technique and constant fluoroscopic guidance. The micro guidewire had a J shaped configuration to avoid dissections or inducing spasm.  A control arteriogram performed through the Catalyst guide catheter in the left internal carotid artery following the exchange micro guidewire placement demonstrated significantly improved caliber and flow through the angioplastied segment to approximately  60-70% patency.  It was elected to proceed with placement of a Wingspan stent across the angioplastied segment of the left middle cerebral artery.  A 3.5 mm x 20 mm Wingspan system was then prepped and purged with heparinized saline infusion in its housing.  The inner core was purged with heparinized saline infusion. Also the delivery microcatheter was purged with heparinized saline infusion and retrogradely via a Tuohy Borst and a 3 way stopcock to ensure absence of any air.  The system was then advanced as a unit over the exchange micro guidewire without difficulty.  The distal and the proximal markers of the stent were then advanced such that they were adequate distant at the site of the angioplastied segment.  The Tuohy Borst at the hub of the microcatheter was then loosened. The inner core was then advanced with its butt at the proximal portion of the stent. Thereafter, whilst holding the inner core of the stent, the delivery microcatheter was retrieved unsheathing the distal and then the proximal portion of the stent. Once delivered, a control arteriogram performed through the 5 [place] Catalyst guide catheter demonstrated excellent apposition of the stent and coverage proximally and distally. Wide patency of the left middle cerebral artery angioplasty and stent segment was seen of almost 70-80%.  The delivery micro guidewire was then retrieved and removed.  Control arteriograms performed through the left internal carotid artery continued to demonstrate excellent flow through left MCA distribution without evidence of intraluminal filling defects or of occlusions involving the anterior or the middle cerebral artery distributions.  6 mg of intra-arterial Integrilin were given following the placement of the Wingspan stent system.  A control arteriogram performed through the 6 [place] Pinnacle sheath following removal of the Catalyst guide catheter now demonstrated the previously angioplastied segment of  the left internal carotid artery proximally. The exchange micro guidewire had been maintained distally in the horizontal petrous segment.  Noted was modest recoil at the site of the previous angioplasty. It was, therefore, decided to proceed with placement of a stent across the angioplastied segment of the previously severely stenotic left internal carotid artery.  Over a 0.014 inch exchange micro guidewire, with the distal end in the petrous horizontal segment, measurements were performed of the proximal and the distal landing zones of the planned stent. After evaluating the measurements obtained, it was decided to proceed with placement of a 9-7 mm x 40  mm Xact stent. This was prepped and purged retrogradely with heparinized saline infusion. Using the rapid exchange technique, the stent delivery system was advanced without difficulty and positioned such that the distal and the proximal markers were adequate distant from the site of the previous angioplasty segment. This stent was then deployed without any difficulty in the usual manner. The delivery catheter system was then retrieved and removed whilst the exchange micro guidewire was maintained distally.  A control arteriogram performed through the 6 [place] Pinnacle sheath immediately and 10 and 20 minutes placement of the stent continued to demonstrate excellent flow through the stented segment with mild spasm at its distal end. Because of the risk of development of platelet aggregation within the stent, it was decided to give additional 6 mg of Integrilin intra-arterially.  A final control arteriogram performed approximately 25 minutes following placement of the left internal carotid artery stent continued to demonstrate excellent flow intracranially and extra cranially. 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 The left middle cerebral artery angioplasty in the stented segment now demonstrated  close to 80% patency without gross evidence of intraluminal filling defects or of occlusions.  Throughout the procedure, the patient's blood pressure and neurological status remained stable. The  patient's ACT was maintained in the region of approximately 130 seconds throughout the procedure.  A flat panel CT of the brain performed with the patient on the table demonstrated no evidence of intracranial hemorrhage, mass effect or midline shift.  The patient was then returned to the OR for removal of the 6 [place] Pinnacle sheath by vascular surgery as per plan.  It was planned to a keep the patient intubated on ventilator for airway protection given the antiplatelets and anticoagulation given for the entirety of the procedure.  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  PLAN: Patient to remain intubated for airway protection given the direct left carotid artery access and use of antiplatelets, and anticoagulation.   Electronically Signed   By: Luanne Bras M.D.   On: 03/28/2019 09:53   IR Intra Cran Stent  Result Date: 03/26/2019 CLINICAL DATA:  Symptomatic high-grade stenosis of the left middle cerebral artery proximal M1 segment with history of recent ischemic stroke.  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  EXAM: TRANSCATHETER THERAPY EMBOLIZATION  COMPARISON:  Diagnostic catheter arteriogram of  March 21, 2019.  MEDICATIONS: Heparin 1000 units IV. Ancef 4 g IV antibiotic was administered within 1 hour of the procedure.  ANESTHESIA/SEDATION: General anesthesia.  CONTRAST:  Isovue 300 approximately 150 mL.  FLUOROSCOPY TIME:  Fluoroscopy Time: 101 minutes 0 seconds (4155 mGy).  COMPLICATIONS: None immediate.  TECHNIQUE: Informed written consent was obtained from the patient after a thorough discussion of the procedural risks, benefits and alternatives. All questions were addressed. Maximal Sterile Barrier Technique was utilized including caps, mask, sterile gowns, sterile gloves, sterile drape, hand hygiene and skin antiseptic. A timeout was performed prior to the initiation of the procedure.  Direct access to the left common  carotid artery was provided by vascular surgery in the OR due to patient's severe peripheral vascular disease, and absent flow in the right radial artery.  A 6 French Pinnacle sheath was inserted and sutured in the left common carotid artery in the mid section. The patient was brought to the angio suite with the 6 French Pinnacle sheath.  This was then connected to continuous heparinized saline infusion following flushing. An arteriogram was then obtained of the left common carotid bifurcation and also intracranially.  FINDINGS: The left common carotid bifurcation again demonstrates the left external carotid artery and its major branches to be widely patent.  The left internal carotid artery just distal to the bulb again demonstrates a significant stenosis just distal to the carotid bulb of approximately 70-75%.  More distally, the left internal carotid artery is seen to opacify to the cranial skull base.  The petrous segment is widely patent.  There is a mild to moderate stenosis of the distal cavernous segment of the left internal carotid artery and to a lesser degree of the supraclinoid left internal carotid artery. A left posterior communicating artery is seen  opacifying the left posterior cerebral artery distribution.  The left middle cerebral artery again demonstrates a severe M1 segment stenosis with opacification of the left MCA trifurcation branches into the capillary and venous phases.  The left anterior cerebral artery demonstrates mild arteriosclerotic changes in the proximal A1 and A2 segments.  More distally, mild arteriosclerotic changes are also noted in the A3 segment of the left anterior cerebral artery pericallosal branch.  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  The subsequent capillary and venous phases are unremarkable.  ENDOVASCULAR TREATMENT OF THE LEFT ANTERIOR CEREBRAL ARTERY PERICALLOSAL REGION ANEURYSM WITH STENT ASSISTED COILING  Initial treatment angioplasty of the left internal carotid approximately was performed following advancement of a 5 mm x 30 mm Viatrac 14 angioplasty microcatheter. This had been prepped and purged with heparinized saline infusion. Over a 014 inch standard Synchro micro guidewire with a J configuration, the combination of the Viatrac 14 angioplasty balloon catheter in the micro guidewire advanced to the proximal left internal carotid artery.  Using a torque device, the micro guidewire was advanced without difficulty to the distal cervical segment of the left internal carotid artery.  This was then followed by the advancement of the Viatrac 5 x 30 mm balloon in place adequate distance at the site of severe stenosis.  Slow control angioplasty was then performed using micro inflation syringe device via micro tubing. Balloon was inflated to 8.3 atmospheres where it was maintained for approximately 1 minute. The balloon was then deflated and advanced more distally. A control arteriogram performed through 5 French Pinnacle sheath in the left common carotid artery again demonstrated significantly improved caliber and flow through the  angioplastied segment.  The combination of the balloon and the microcatheter were advanced to the horizontal petrous segment of the left internal carotid artery.  The micro guidewire was removed, and the Viatrac balloon was then exchanged for an 014 inch 300 cm Softip Transend exchange guidewire. Over the exchange micro guidewire, a 5 French 115 cm Catalyst guide catheter was advanced and positioned in the horizontal petrous segment of the left internal carotid artery.  The exchange micro guidewire was then removed. Good aspiration was obtained from the hub of the 5 [place] Catalyst guide catheter in the left internal carotid artery. A control  arteriogram performed through the 5 [place] Catalyst guide catheter in the left internal carotid artery demonstrated no changes in the intracranial circulation.  Over a 0.014 inch standard Synchro micro guidewire with a J configuration, a Headway 17 2 tip microcatheter was advanced without difficulty to the supraclinoid left ICA.  The micro guidewire was then gently manipulated with a torque device and advanced through left anterior cerebral artery A1 A2 segments followed by the microcatheter to the A3 segment of the anterior cerebral artery pericallosal branch. The wire was then advanced without difficulty distal to the neck of the aneurysm followed by the microcatheter. The guidewire was removed. Good aspiration obtained from the hub of microcatheter. A gentle control arteriogram performed through the microcatheter demonstrates safe position of tip of the microcatheter. This was then connected to continuous heparinized saline infusion. At this time after having obtained measurements of the pericallosal artery distal and proximal to the aneurysm, a 2.5 mm x 23 mm Lvis junior stent was advanced to the distal end of the microcatheter.  Under constant fluoroscopic guidance using roadmap technique, the delivery microcatheter was retrieved unsheathing the distal and then the  proximal Lvis stent. A control arteriogram performed through the 5 [place] Catalyst guide catheter now advanced to the A1 segment of the left anterior cerebral artery demonstrates safe positioning and excellent apposition of the Lvis stent. At this time, the 014 inch standard Synchro micro guidewire was then advanced through the microcatheter into the aneurysm without difficulty followed by the microcatheter. The guidewire was removed. Good aspiration was obtained with the intra aneurysmal microcatheter. This was then connected to continuous heparinized saline infusion.  The first coil utilized for aneurysm embolization was a Microplex 10 5 mm x 15 mm HyperSoft 3D coil. This was advanced using biplane roadmap technique and constant fluoroscopic guidance in a coaxial manner and with constant heparinized saline infusion to the distal end of the intra-aneurysmal microcatheter. This coil was then advanced under constant fluoroscopic guidance. Prior to its detachment, a control arteriogram performed through the 5 [place] Catalyst guide catheter in the left anterior cerebral artery demonstrates safe positioning of the coil. This was then detached without difficulty. This was subsequently followed by advancement of a 4 mm x 8 mm HyperSoft 3D coil, a 2.5 mm x 8 cm HyperSoft 3D coil, a 3 mm x 6 cm HyperSoft 3D coil, a 3 mm x 4 cm Microplex HyperSoft 3D coil and finally a 2 mm x 6 cm Microplex HyperSoft helical coil. Each of these coils was advanced into the aneurysm under fluoroscopic guidance using biplane roadmap technique. Prior to their detachment, a control arteriogram was performed through the guide catheter to ensure safe positioning of the coil mass.  Following the final coil there was near complete obliteration of the aneurysm.  The microcatheter was gently retrieved from the coil mass without any difficulty. A control arteriogram performed demonstrated near complete occlusion of the aneurysm with wide patency of  the Lvis stent across the neck of the aneurysm.  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  Using a torque device, access through the nearly completely occluded left middle  cerebral artery was achieved with the micro guidewire which was advanced to the distal M2 M3 region of the inferior division. The balloon was then advanced without difficulty and positioned such that the proximal and the distal markers were adequate distant and covered the segmental high-grade stenosis.  A control angioplasty was then performed using micro inflation syringe device via micro tubing. Slow increments were performed in the atmospheric pressures of the balloon to 4.9 atmospheres achieving approximately 1.95 mm diameter of the balloon. This was maintained for approximately 90 seconds. The balloon was then gently deflated and retrieved proximally whilst the wire was maintained distally in the M2 M3 region.  A control arteriogram performed through the 5 [place] Catalyst guide catheter demonstrated significantly improved caliber and flow through the angioplastied segment. The Gateway balloon was then advanced to the M2 M3 region over the micro guidewire. The micro guidewire was then retrieved. Free aspiration of blood was noted from the hub of the Gateway microcatheter.  This was in turn exchanged for a 014 inch 300 cm Softip Transend exchange micro guidewire using biplane roadmap technique and constant fluoroscopic guidance. The micro guidewire had a J shaped configuration to avoid dissections or inducing spasm.  A control  arteriogram performed through the Catalyst guide catheter in the left internal carotid artery following the exchange micro guidewire placement demonstrated significantly improved caliber and flow through the angioplastied segment to approximately 60-70% patency.  It was elected to proceed with placement of a Wingspan stent across the angioplastied segment of the left middle cerebral artery.  A 3.5 mm x 20 mm Wingspan system was then prepped and purged with heparinized saline infusion in its housing.  The inner core was purged with heparinized saline infusion. Also the delivery microcatheter was purged with heparinized saline infusion and retrogradely via a Tuohy Borst and a 3 way stopcock to ensure absence of any air.  The system was then advanced as a unit over the exchange micro guidewire without difficulty.  The distal and the proximal markers of the stent were then advanced such that they were adequate distant at the site of the angioplastied segment.  The Tuohy Borst at the hub of the microcatheter was then loosened. The inner core was then advanced with its butt at the proximal portion of the stent. Thereafter, whilst holding the inner core of the stent, the delivery microcatheter was retrieved unsheathing the distal and then the proximal portion of the stent. Once delivered, a control arteriogram performed through the 5 [place] Catalyst guide catheter demonstrated excellent apposition of the stent and coverage proximally and distally. Wide patency of the left middle cerebral artery angioplasty and stent segment was seen of almost 70-80%.  The delivery micro guidewire was then retrieved and removed.  Control arteriograms performed through the left internal carotid artery continued to demonstrate excellent flow through left MCA distribution without evidence of intraluminal filling defects or of occlusions involving the anterior or the middle cerebral artery distributions.  6 mg of intra-arterial  Integrilin were given following the placement of the Wingspan stent system.  A control arteriogram performed through the 6 French Pinnacle sheath following removal of the Catalyst guide catheter now demonstrated the previously angioplastied segment of the left internal carotid artery proximally. The exchange micro guidewire had been maintained distally in the horizontal petrous segment.  Noted was modest recoil at the site of the previous angioplasty. It was, therefore, decided to proceed with placement of a stent across the angioplastied segment of the previously severely stenotic  left internal carotid artery.  Over a 0.014 inch exchange micro guidewire, with the distal end in the petrous horizontal segment, measurements were performed of the proximal and the distal landing zones of the planned stent. After evaluating the measurements obtained, it was decided to proceed with placement of a 9-7 mm x 40 mm Xact stent. This was prepped and purged retrogradely with heparinized saline infusion. Using the rapid exchange technique, the stent delivery system was advanced without difficulty and positioned such that the distal and the proximal markers were adequate distant from the site of the previous angioplasty segment. This stent was then deployed without any difficulty in the usual manner. The delivery catheter system was then retrieved and removed whilst the exchange micro guidewire was maintained distally.  A control arteriogram performed through the 6 French Pinnacle sheath immediately and 10 and 20 minutes placement of the stent continued to demonstrate excellent flow through the stented segment with mild spasm at its distal end. Because of the risk of development of platelet aggregation within the stent, it was decided to give additional 6 mg of Integrilin intra-arterially.  A final control arteriogram performed approximately 25 minutes following placement of the left internal carotid artery stent continued to  demonstrate excellent flow intracranially and extra cranially. 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 The left middle cerebral artery angioplasty in the stented segment now demonstrated close to 80% patency without gross evidence of intraluminal filling defects or of occlusions.  Throughout the procedure, the patient's blood pressure and neurological status remained stable. The  patient's ACT was maintained in the region of approximately 130 seconds throughout the procedure.  A flat panel CT of the brain performed with the patient on the table demonstrated no evidence of intracranial hemorrhage, mass effect or midline shift.  The patient was then returned to the OR for removal of the 6 French Pinnacle sheath by vascular surgery as per plan.  It was planned to a keep the patient intubated on ventilator for airway protection given the antiplatelets and anticoagulation given for the entirety of the procedure.  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  PLAN: Patient to remain intubated for airway protection given the direct left carotid artery access and use of antiplatelets, and anticoagulation.   Electronically Signed   By: Luanne Bras M.D.   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  EXAM: TRANSCATHETER THERAPY EMBOLIZATION  COMPARISON:  Diagnostic catheter arteriogram of March 21, 2019.  MEDICATIONS: Heparin 1000 units IV. Ancef 4 g IV antibiotic was administered within 1 hour of the procedure.  ANESTHESIA/SEDATION: General anesthesia.  CONTRAST:  Isovue 300 approximately 150 mL.  FLUOROSCOPY TIME:  Fluoroscopy Time: 101 minutes 0 seconds (4155 mGy).  COMPLICATIONS: None immediate.  TECHNIQUE: Informed written consent was obtained from the  patient after a thorough discussion of the procedural risks, benefits and alternatives. All questions were addressed. Maximal Sterile Barrier Technique was utilized including caps, mask, sterile gowns, sterile gloves, sterile drape, hand hygiene and skin antiseptic. A timeout was performed prior to the initiation of the procedure.  Direct access to the left common carotid artery was provided by vascular surgery in the OR due to patient's severe peripheral vascular disease, and absent flow in the right radial artery.  A 6 French Pinnacle sheath was inserted and sutured in the left common carotid artery in the mid section. The patient was brought to the angio suite with the 6 French Pinnacle sheath.  This was then connected to continuous heparinized saline infusion following flushing. An arteriogram was then obtained of the left common carotid bifurcation and also intracranially.  FINDINGS: The left common carotid bifurcation again demonstrates the left external carotid artery and its major branches to be widely patent.  The left internal carotid artery just distal to the bulb again demonstrates a significant stenosis just distal to the carotid bulb of approximately 70-75%.  More distally, the left internal carotid artery is seen to opacify to the cranial skull base.  The petrous segment is  widely patent.  There is a mild to moderate stenosis of the distal cavernous segment of the left internal carotid artery and to a lesser degree of the supraclinoid left internal carotid artery. A left posterior communicating artery is seen opacifying the left posterior cerebral artery distribution.  The left middle cerebral artery again demonstrates a severe M1 segment stenosis with opacification of the left MCA trifurcation branches into the capillary and venous phases.  The left anterior cerebral artery demonstrates mild arteriosclerotic changes in the proximal A1 and A2 segments.  More distally, mild arteriosclerotic changes are also noted in the A3 segment of the left anterior cerebral artery pericallosal branch.  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  The subsequent capillary and venous phases are unremarkable.  ENDOVASCULAR TREATMENT OF THE LEFT ANTERIOR CEREBRAL ARTERY PERICALLOSAL REGION ANEURYSM WITH STENT ASSISTED COILING  Initial treatment angioplasty of the left internal carotid approximately was performed following advancement of a 5 mm x 30 mm Viatrac 14 angioplasty microcatheter. This had been prepped and purged with heparinized saline infusion. Over a 014 inch standard Synchro micro guidewire with a J configuration, the combination of the Viatrac 14 angioplasty balloon catheter in the micro guidewire advanced to the proximal left internal carotid artery.  Using a torque device, the micro guidewire was advanced without difficulty to the distal cervical segment of the left internal carotid artery.  This was then followed by the advancement of the Viatrac 5 x 30 mm balloon in place adequate distance at the site of severe stenosis.  Slow control angioplasty was then performed using micro inflation syringe device via micro tubing. Balloon was inflated to 8.3 atmospheres where it was maintained for  approximately 1 minute. The balloon was then deflated and advanced more distally. A control arteriogram performed through 5 French Pinnacle sheath in the left common carotid artery again demonstrated significantly improved caliber and flow through the angioplastied segment.  The combination of the balloon and the microcatheter were advanced to the horizontal petrous segment of the left internal carotid artery.  The micro guidewire was removed, and the Viatrac balloon was then exchanged for an 014 inch 300 cm Softip Transend exchange guidewire. Over the exchange micro  guidewire, a 5 French 115 cm Catalyst guide catheter was advanced and positioned in the horizontal petrous segment of the left internal carotid artery.  The exchange micro guidewire was then removed. Good aspiration was obtained from the hub of the 5 [place] Catalyst guide catheter in the left internal carotid artery. A control arteriogram performed through the 5 [place] Catalyst guide catheter in the left internal carotid artery demonstrated no changes in the intracranial circulation.  Over a 0.014 inch standard Synchro micro guidewire with a J configuration, a Headway 17 2 tip microcatheter was advanced without difficulty to the supraclinoid left ICA.  The micro guidewire was then gently manipulated with a torque device and advanced through left anterior cerebral artery A1 A2 segments followed by the microcatheter to the A3 segment of the anterior cerebral artery pericallosal branch. The wire was then advanced without difficulty distal to the neck of the aneurysm followed by the microcatheter. The guidewire was removed. Good aspiration obtained from the hub of microcatheter. A gentle control arteriogram performed through the microcatheter demonstrates safe position of tip of the microcatheter. This was then connected to continuous heparinized saline infusion. At this time after having obtained measurements of the pericallosal artery distal and  proximal to the aneurysm, a 2.5 mm x 23 mm Lvis junior stent was advanced to the distal end of the microcatheter.  Under constant fluoroscopic guidance using roadmap technique, the delivery microcatheter was retrieved unsheathing the distal and then the proximal Lvis stent. A control arteriogram performed through the 5 [place] Catalyst guide catheter now advanced to the A1 segment of the left anterior cerebral artery demonstrates safe positioning and excellent apposition of the Lvis stent. At this time, the 014 inch standard Synchro micro guidewire was then advanced through the microcatheter into the aneurysm without difficulty followed by the microcatheter. The guidewire was removed. Good aspiration was obtained with the intra aneurysmal microcatheter. This was then connected to continuous heparinized saline infusion.  The first coil utilized for aneurysm embolization was a Microplex 10 5 mm x 15 mm HyperSoft 3D coil. This was advanced using biplane roadmap technique and constant fluoroscopic guidance in a coaxial manner and with constant heparinized saline infusion to the distal end of the intra-aneurysmal microcatheter. This coil was then advanced under constant fluoroscopic guidance. Prior to its detachment, a control arteriogram performed through the 5 [place] Catalyst guide catheter in the left anterior cerebral artery demonstrates safe positioning of the coil. This was then detached without difficulty. This was subsequently followed by advancement of a 4 mm x 8 mm HyperSoft 3D coil, a 2.5 mm x 8 cm HyperSoft 3D coil, a 3 mm x 6 cm HyperSoft 3D coil, a 3 mm x 4 cm Microplex HyperSoft 3D coil and finally a 2 mm x 6 cm Microplex HyperSoft helical coil. Each of these coils was advanced into the aneurysm under fluoroscopic guidance using biplane roadmap technique. Prior to their detachment, a control arteriogram was performed through the guide catheter to ensure safe positioning of the coil mass.  Following the  final coil there was near complete obliteration of the aneurysm.  The microcatheter was gently retrieved from the coil mass without any difficulty. A control arteriogram performed demonstrated near complete occlusion of the aneurysm with wide patency of the Lvis stent across the neck of the aneurysm.  ENDOVASCULAR STENT ASSISTED ANGIOPLASTY OF SYMPTOMATIC HIGH-GRADE STENOSIS OF THE LEFT MIDDLE CEREBRAL ARTERY M1 SEGMENT  Over a 0.014 inch standard Synchro micro guidewire, a 2 mm x 15  mm Gateway angioplasty balloon catheter which had been prepped with 50% contrast and 50% heparinized saline infusion was advanced through the 5 [place] Catalyst guide catheter in the petrous segment of the left internal carotid artery to the supraclinoid left ICA.  Using a torque device, access through the nearly completely occluded left middle cerebral artery was achieved with the micro guidewire which was advanced to the distal M2 M3 region of the inferior division. The balloon was then advanced without difficulty and positioned such that the proximal and the distal markers were adequate distant and covered the segmental high-grade stenosis.  A control angioplasty was then performed using micro inflation syringe device via micro tubing. Slow increments were performed in the atmospheric pressures of the balloon to 4.9 atmospheres achieving approximately 1.95 mm diameter of the balloon. This was maintained for approximately 90 seconds. The balloon was then gently deflated and retrieved proximally whilst the wire was maintained distally in the M2 M3 region.  A control arteriogram performed through the 5 [place] Catalyst guide catheter demonstrated significantly improved caliber and flow through the angioplastied segment. The Gateway balloon was then advanced to the M2 M3 region over the micro guidewire. The micro guidewire was then retrieved. Free aspiration of blood was noted from the hub of the Gateway microcatheter.  This was in  turn exchanged for a 014 inch 300 cm Softip Transend exchange micro guidewire using biplane roadmap technique and constant fluoroscopic guidance. The micro guidewire had a J shaped configuration to avoid dissections or inducing spasm.  A control arteriogram performed through the Catalyst guide catheter in the left internal carotid artery following the exchange micro guidewire placement demonstrated significantly improved caliber and flow through the angioplastied segment to approximately 60-70% patency.  It was elected to proceed with placement of a Wingspan stent across the angioplastied segment of the left middle cerebral artery.  A 3.5 mm x 20 mm Wingspan system was then prepped and purged with heparinized saline infusion in its housing.  The inner core was purged with heparinized saline infusion. Also the delivery microcatheter was purged with heparinized saline infusion and retrogradely via a Tuohy Borst and a 3 way stopcock to ensure absence of any air.  The system was then advanced as a unit over the exchange micro guidewire without difficulty.  The distal and the proximal markers of the stent were then advanced such that they were adequate distant at the site of the angioplastied segment.  The Tuohy Borst at the hub of the microcatheter was then loosened. The inner core was then advanced with its butt at the proximal portion of the stent. Thereafter, whilst holding the inner core of the stent, the delivery microcatheter was retrieved unsheathing the distal and then the proximal portion of the stent. Once delivered, a control arteriogram performed through the 5 [place] Catalyst guide catheter demonstrated excellent apposition of the stent and coverage proximally and distally. Wide patency of the left middle cerebral artery angioplasty and stent segment was seen of almost 70-80%.  The delivery micro guidewire was then retrieved and removed.  Control arteriograms performed through the left internal  carotid artery continued to demonstrate excellent flow through left MCA distribution without evidence of intraluminal filling defects or of occlusions involving the anterior or the middle cerebral artery distributions.  6 mg of intra-arterial Integrilin were given following the placement of the Wingspan stent system.  A control arteriogram performed through the 6 French Pinnacle sheath following removal of the Catalyst guide catheter now demonstrated the previously  angioplastied segment of the left internal carotid artery proximally. The exchange micro guidewire had been maintained distally in the horizontal petrous segment.  Noted was modest recoil at the site of the previous angioplasty. It was, therefore, decided to proceed with placement of a stent across the angioplastied segment of the previously severely stenotic left internal carotid artery.  Over a 0.014 inch exchange micro guidewire, with the distal end in the petrous horizontal segment, measurements were performed of the proximal and the distal landing zones of the planned stent. After evaluating the measurements obtained, it was decided to proceed with placement of a 9-7 mm x 40 mm Xact stent. This was prepped and purged retrogradely with heparinized saline infusion. Using the rapid exchange technique, the stent delivery system was advanced without difficulty and positioned such that the distal and the proximal markers were adequate distant from the site of the previous angioplasty segment. This stent was then deployed without any difficulty in the usual manner. The delivery catheter system was then retrieved and removed whilst the exchange micro guidewire was maintained distally.  A control arteriogram performed through the 6 French Pinnacle sheath immediately and 10 and 20 minutes placement of the stent continued to demonstrate excellent flow through the stented segment with mild spasm at its distal end. Because of the risk of development of  platelet aggregation within the stent, it was decided to give additional 6 mg of Integrilin intra-arterially.  A final control arteriogram performed approximately 25 minutes following placement of the left internal carotid artery stent continued to demonstrate excellent flow intracranially and extra cranially. 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 The left middle cerebral artery angioplasty in the stented segment now demonstrated close to 80% patency without gross evidence of intraluminal filling defects or of occlusions.  Throughout the procedure, the patient's blood pressure and neurological status remained stable. The  patient's ACT was maintained in the region of approximately 130 seconds throughout the procedure.  A flat panel CT of the brain performed with the patient on the table demonstrated no evidence of intracranial hemorrhage, mass effect or midline shift.  The patient was then returned to the OR for removal of the 6 French Pinnacle sheath by vascular surgery as per plan.  It was planned to a keep the patient intubated on ventilator for airway protection given the antiplatelets and anticoagulation given for the entirety of the procedure.  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  PLAN: Patient to remain intubated for airway protection given the direct left carotid artery access and use of antiplatelets, and anticoagulation.   Electronically Signed   By: Luanne Bras M.D.   On: 03/28/2019 09:53   CT CEREBRAL PERFUSION W  CONTRAST  Result Date: 03/26/2019 CLINICAL DATA:  Follow-up examination for acute stroke, recent ICA and MCA stenting and aneurysm coiling EXAM: CT ANGIOGRAPHY HEAD AND NECK CT PERFUSION BRAIN TECHNIQUE: Multidetector CT imaging of the head and neck was performed using the standard protocol during bolus administration of intravenous contrast. Multiplanar CT image reconstructions and MIPs were obtained to evaluate the vascular anatomy. Carotid stenosis measurements (when applicable) are obtained utilizing NASCET criteria, using the distal internal carotid  diameter as the denominator. Multiphase CT imaging of the brain was performed following IV bolus contrast injection. Subsequent parametric perfusion maps were calculated using RAPID software. CONTRAST:  121mL OMNIPAQUE IOHEXOL 350 MG/ML SOLN COMPARISON:  Comparison made with prior CTA from 03/19/2019. FINDINGS: CT HEAD FINDINGS Brain: Generalized age-related cerebral atrophy with chronic small vessel ischemic disease. No acute intracranial hemorrhage. No acute large vessel territory infarct. No mass lesion, midline shift or mass effect. No hydrocephalus. No extra-axial fluid collection. 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 Vascular stent has been placed within the left M1 segment. No hyperdense vessel. Scattered vascular calcifications noted within the carotid siphons. Skull: Scalp soft tissues within normal limits.  Calvarium intact. Sinuses/Orbits: Globes and orbital soft tissues within normal limits. Scattered mucosal thickening noted within the ethmoidal air cells and right maxillary sinus. Few small air-fluid levels noted. Mastoid air cells are clear. Patient is intubated. Other: None. CTA NECK FINDINGS Aortic arch: Visualized aortic arch of normal caliber. Bovine arch with common origin of the right brachiocephalic and left common carotid artery noted. Extensive noncalcified plaque seen throughout the visualized arch  and about the origin of the great vessels without hemodynamically significant stenosis. Irregular soft plaque and/or thrombus protruding into the lumen at the origin of the right brachiocephalic artery (series 12, image 38), unchanged. 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 Subclavian arteries otherwise irregular but patent without flow-limiting stenosis. Right carotid system: Right common carotid artery patent from its origin to the bifurcation without stenosis. Mild scattered plaque about the right bifurcation/proximal right ICA without hemodynamically significant stenosis. Right ICA irregular but patent to the skull base without stenosis, dissection, or occlusion. Left carotid system: Multifocal atheromatous irregularity within the left common carotid artery without stenosis. Interval placement of a vascular stent, proximal aspect at the origin of the left ICA. Mild-to-moderate stenoses involving the mid and distal aspect of the stent, measuring up to approximately 50% by NASCET criteria. Widely patent flow otherwise seen through the stent. No intraluminal thrombus or other complication. Left ICA irregular but otherwise widely patent to the skull base without stenosis, dissection, or occlusion. Vertebral arteries: Both vertebral arteries arise from the subclavian arteries. Vertebral arteries remain widely patent within the neck without stenosis, dissection or occlusion. Skeleton: No acute osseous abnormality. No discrete osseous lesions. Moderate cervical spondylosis noted at C5-6. Patient is edentulous. Other neck: Endotracheal and enteric tubes in place. Postoperative changes from recent cutdown and open exposure of the left common carotid artery seen at the lower anterior left neck. Percutaneous drain remains in place within this region with associated scattered foci of soft tissue emphysema. Associated postoperative swelling and blood products present within this region as  well. Small linear contrast blush within this region adjacent to the surgical drain likely reflects a small amount of persistent bleeding, likely venous in nature (series 5, image 36). No other active contrast extravasation. Upper chest: Layering bilateral pleural effusions with associated atelectasis partially visualized. Paraseptal emphysematous changes noted at the lung apices. Median sternotomy partially visualized. Review of the MIP images confirms the above findings CTA HEAD FINDINGS Anterior circulation: Petrous segments are widely patent. Scattered atherosclerotic change throughout the carotid siphons with associated moderate multifocal narrowing, unchanged. Left A1 widely patent. Hypoplastic right A1. Normal anterior communicating artery. Partially azygos ACA noted. 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 No visible neck remnant identified. Grossly patent flow through the adjacent stent. Interval placement of a vascular stent across the  previously seen severe left M1 stenosis. Patent flow is seen through the stent. Left MCA branches perfused distally. Mild stenosis involving the proximal right M1 segment noted, stable. Distal right MCA branches well perfused and stable from previous. Posterior circulation: Vertebral arteries remain widely patent to the vertebrobasilar junction. Posterior inferior cerebral arteries patent bilaterally. Basilar diffusely diminutive with associated mild multifocal narrowing. Superior cerebral arteries patent bilaterally. PCA supplied via hypoplastic P1 segments as well as robust bilateral posterior communicating arteries. Prominent atherosclerotic change throughout both PCAs with associated moderate to severe multifocal stenoses, right worse than left, grossly stable. Venous sinuses: Grossly patent allowing for timing of the contrast bolus Anatomic variants: Predominant fetal type origin of the PCAs. Hypoplastic right A1 segment. Review of the  MIP images confirms the above findings CT Brain Perfusion Findings: CBF (<30%) Volume: 10mL Perfusion (Tmax>6.0s) volume: 19mL Mismatch Volume: 51mL Infarction Location:Negative CT perfusion for acute ischemia. On source perfusion maps, there is increased cerebral blood volume and cerebral blood flow with mildly decreased mean transit time and T-max, likely reflecting improved cerebrovascular flow to the left cerebral hemisphere due to interval stenting of the left ICA and MCA. No other perfusion abnormality. IMPRESSION: CT HEAD IMPRESSION: 1. No acute intracranial abnormality. 2. 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 3. 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 CTA HEAD AND NECK IMPRESSION: 1. Negative CTA for emergent large vessel occlusion. 2. Interval stenting at the proximal left ICA and left M1 segment without complication. Patent flow seen through both stents. 3. 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 No residual neck remnant identified. 4. Postoperative changes from interval cutdown for left carotid artery exposure/access. Surgical drain remains in place. Small blush of contrast adjacent to the drain consistent with a small focus of active bleeding, likely venous in nature. 5. Otherwise stable CTA with extensive atherosclerotic change elsewhere throughout the major arterial vasculature of the head and neck. CT PERFUSION IMPRESSION: 1. Negative CT perfusion for acute core infarct. 2. Increased cerebral blood flow and blood volume with decreased T-max within the left cerebral hemisphere, reflecting improved vascular flow to the left MCA distribution due to the left ICA and MCA stents. 3. Otherwise negative CT perfusion, with no other perfusion abnormality. These results were communicated to Dr. Lorraine Lax at 9:30 pmon 1/27/2021by text page via the [facility] messaging system. Electronically Signed   By:  Jeannine Boga M.D.   On: 03/26/2019 22:49   DG CHEST PORT 1 VIEW  Result Date: 03/28/2019 CLINICAL DATA:  Hypertension, stroke EXAM: PORTABLE CHEST 1 VIEW COMPARISON:  Radiograph 03/26/2019 FINDINGS: Interval removal of the endotracheal and transesophageal tubes. Right upper extremity PICC tip terminates at the superior cavoatrial junction. Telemetry leads overlie the chest. Median sternotomy wires remain intact and aligned. Extensive CABG clips project over the cardiac silhouette. Slightly improved atelectasis when compared to prior. No acute osseous or soft tissue abnormality. IMPRESSION: Improving volumes. Stable cardiomegaly. Removal of the endotracheal and transesophageal tubes. Satisfactory positioning of the right upper extremity PICC. Electronically Signed   By: Lovena Le M.D.   On: 03/28/2019 05:52   DG Chest Port 1 View  Result Date: 03/26/2019 CLINICAL DATA:  Endotracheal tube placement. Hypertension. Coronary artery disease. EXAM: PORTABLE CHEST 1 VIEW COMPARISON:  03/17/2019 from [facility]: Prior median sternotomy. Endotracheal tube terminates 5.5 cm above carina. Nasogastric terminates at the body of the stomach with the side port likely just at the gastroesophageal junction. Numerous  leads and wires project over the chest. Cardiomegaly accentuated by AP portable technique. The apices are partially excluded. No pleural fluid. Low lung volumes with resultant pulmonary interstitial prominence. New subsegmental atelectasis at the left lung base. IMPRESSION: Nasogastric tube borderline low in position.  Consider advancement. Otherwise, appropriate position of support apparatus. Diminished lung volumes with new left lower lobe subsegmental atelectasis. Cardiomegaly without congestive failure. Electronically Signed   By: Abigail Miyamoto M.D.   On: 03/26/2019 16:16   DG C-Arm 1-60 Min-No Report  Result Date: 03/26/2019 Fluoroscopy was utilized by the requesting physician.   No radiographic interpretation.   ECHOCARDIOGRAM COMPLETE  Result Date: 03/27/2019   ECHOCARDIOGRAM REPORT   Patient Name:   Jeremy Sherman [facility] Date of Exam: 03/27/2019 Medical Rec #:  [id number]                 Height:       77.0 in Accession #:    [id number]                Weight:       233.9 lb Date of Birth:  11-Jul-1950                BSA:          2.39 m Patient Age:    53 years                  BP:           113/101 mmHg Patient Gender: M                         HR:           72 bpm. Exam Location:  Inpatient Procedure: 2D Echo Indications:    Acute Respiratory Insufficiency 518.82 / R06.89  History:        Patient has no prior history of Echocardiogram examinations.                 CAD; Stroke.  Sonographer:    Vikki Ports T[place] Referring Phys: [place]  1. Left ventricular ejection fraction, by visual estimation, is 50 to 55%. The left ventricle has low normal function. Left ventricular septal wall thickness was mildly increased. Mildly increased left ventricular posterior wall thickness. There is no left ventricular hypertrophy.  2. Left ventricular diastolic parameters are consistent with Grade I diastolic dysfunction (impaired relaxation).  3. Global right ventricle has normal systolic function.The right ventricular size is normal. No increase in right ventricular wall thickness.  4. Left atrial size was severely dilated.  5. Right atrial size was severely dilated.  6. The mitral valve is normal in structure. Trivial mitral valve regurgitation. No evidence of mitral stenosis.  7. The tricuspid valve is normal in structure.  8. The tricuspid valve is normal in structure. Tricuspid valve regurgitation is trivial.  9. The aortic valve is tricuspid. Aortic valve regurgitation is not visualized. No evidence of aortic valve sclerosis or stenosis. 10. The pulmonic valve was normal in structure. Pulmonic valve regurgitation is trivial. 11. Normal pulmonary artery systolic  pressure. 12. The inferior vena cava is normal in size with greater than 50% respiratory variability, suggesting right atrial pressure of 3 mmHg. FINDINGS  Left Ventricle: Left ventricular ejection fraction, by visual estimation, is 50 to 55%. The left ventricle has low normal function. The left ventricle is not well visualized. The left ventricular internal cavity size was the left ventricle is  normal in size. Mildly increased left ventricular posterior wall thickness. There is no left ventricular hypertrophy. Left ventricular diastolic parameters are consistent with Grade I diastolic dysfunction (impaired relaxation). Normal left atrial pressure. Right Ventricle: The right ventricular size is normal. No increase in right ventricular wall thickness. Global RV systolic function is has normal systolic function. The tricuspid regurgitant velocity is 1.91 m/s, and with an assumed right atrial pressure  of 3 mmHg, the estimated right ventricular systolic pressure is normal at 17.6 mmHg. Left Atrium: Left atrial size was severely dilated. Right Atrium: Right atrial size was severely dilated Pericardium: There is no evidence of pericardial effusion. Mitral Valve: The mitral valve is normal in structure. Trivial mitral valve regurgitation. No evidence of mitral valve stenosis by observation. Tricuspid Valve: The tricuspid valve is normal in structure. Tricuspid valve regurgitation is trivial. Aortic Valve: The aortic valve is tricuspid. Aortic valve regurgitation is not visualized. The aortic valve is structurally normal, with no evidence of sclerosis or stenosis. Aortic valve mean gradient measures 4.0 mmHg. Aortic valve peak gradient measures 9.1 mmHg. Aortic valve area, by VTI measures 4.61 cm. Pulmonic Valve: The pulmonic valve was normal in structure. Pulmonic valve regurgitation is trivial. Pulmonic regurgitation is trivial. Aorta: The aortic root, ascending aorta and aortic arch are all structurally normal, with no  evidence of dilitation or obstruction. Venous: The inferior vena cava is normal in size with greater than 50% respiratory variability, suggesting right atrial pressure of 3 mmHg. IAS/Shunts: No atrial level shunt detected by color flow Doppler. There is no evidence of a patent foramen ovale. No ventricular septal defect is seen or detected. There is no evidence of an atrial septal defect.  LEFT VENTRICLE PLAX 2D LVIDd:         4.95 cm       Diastology LVIDs:         3.75 cm       LV e' lateral:   6.74 cm/s LV PW:         1.20 cm       LV E/e' lateral: 12.4 LV IVS:        1.20 cm       LV e' medial:    8.81 cm/s LVOT diam:     2.70 cm       LV E/e' medial:  9.5 LV SV:         55 ml LV SV Index:   22.99 LVOT Area:     5.73 cm  LV Volumes (MOD) LV area d, A2C:    31.40 cm LV area d, A4C:    35.10 cm LV area s, A2C:    19.60 cm LV area s, A4C:    21.50 cm LV major d, A2C:   8.01 cm LV major d, A4C:   8.17 cm LV major s, A2C:   7.17 cm LV major s, A4C:   6.43 cm LV vol d, MOD A2C: 104.0 ml LV vol d, MOD A4C: 131.0 ml LV vol s, MOD A2C: 47.5 ml LV vol s, MOD A4C: 62.1 ml LV SV MOD A2C:     56.5 ml LV SV MOD A4C:     131.0 ml LV SV MOD BP:      60.5 ml RIGHT VENTRICLE RV S prime:     15.40 cm/s TAPSE (M-mode): 2.1 cm LEFT ATRIUM              Index       RIGHT ATRIUM  Index LA diam:        4.05 cm  1.69 cm/m  RA Area:     33.00 cm LA Vol (A2C):   92.8 ml  38.82 ml/m RA Volume:   115.00 ml 48.10 ml/m LA Vol (A4C):   109.0 ml 45.59 ml/m LA Biplane Vol: 101.0 ml 42.25 ml/m  AORTIC VALVE AV Area (Vmax):    4.36 cm AV Area (Vmean):   4.36 cm AV Area (VTI):     4.61 cm AV Vmax:           151.00 cm/s AV Vmean:          97.300 cm/s AV VTI:            0.231 m AV Peak Grad:      9.1 mmHg AV Mean Grad:      4.0 mmHg LVOT Vmax:         115.00 cm/s LVOT Vmean:        74.100 cm/s LVOT VTI:          0.186 m LVOT/AV VTI ratio: 0.81  AORTA Ao Root diam: 3.80 cm MITRAL VALVE                        TRICUSPID VALVE MV  Area (PHT): 3.74 cm             TR Peak grad:   14.6 mmHg MV PHT:        58.87 msec           TR Vmax:        191.00 cm/s MV Decel Time: 203 msec MV E velocity: 83.40 cm/s 103 cm/s  SHUNTS MV A velocity: 92.20 cm/s 70.3 cm/s Systemic VTI:  0.19 m MV E/A ratio:  0.90       1.5       Systemic Diam: 2.70 cm  Skeet Latch MD Electronically signed by Skeet Latch MD Signature Date/Time: 03/27/2019/4:31:15 PM    Final    VAS US CAROTID  Result Date: 03/27/2019 Carotid Arterial Duplex Study Indications:       CVA, Syncope, Left stent and possible hematoma. Risk Factors:      Hypertension, current smoker, coronary artery disease. Limitations        Today's exam was limited due to the post surgical status of                    the patient, patient on a ventilator and movement. Comparison Study:  No prior study on file for comparison. Performing Technologist: Sharion Dove RVS  Examination Guidelines: A complete evaluation includes B-mode imaging, spectral Doppler, color Doppler, and power Doppler as needed of all accessible portions of each vessel. Bilateral testing is considered an integral part of a complete examination. Limited examinations for reoccurring indications may be performed as noted.  Left Carotid Findings: +----------+--------+--------+--------+------------------+------------------+           PSV cm/sEDV cm/sStenosisPlaque DescriptionComments           +----------+--------+--------+--------+------------------+------------------+ CCA Prox  94      19                                intimal thickening +----------+--------+--------+--------+------------------+------------------+ CCA Distal81      15  intimal thickening +----------+--------+--------+--------+------------------+------------------+ ICA Prox                                            stent              +----------+--------+--------+--------+------------------+------------------+  ICA Distal52      22                                                   +----------+--------+--------+--------+------------------+------------------+ ECA       55      12                                                   +----------+--------+--------+--------+------------------+------------------+ +----------+--------+--------+--------------+-------------------+           PSV cm/sEDV cm/sDescribe      Arm Pressure (mmHG) +----------+--------+--------+--------------+-------------------+ Subclavian                Not identified                    +----------+--------+--------+--------------+-------------------+ +---------+--------+--------+--------------+ VertebralPSV cm/sEDV cm/sNot identified +---------+--------+--------+--------------+  Left Stent(s): +--------------+--+--++++ Prox to Stent 6211 +--------------+--+--++++ Proximal [id number] +--------------+--+--++++ Mid Stent     [id number] +--------------+--+--++++    Summary:  Left Carotid: Patent stent. Small area of mixed echoes noted at incision site,               no pseudoaneurysm or large hematoma noted. 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 *See table(s) above for measurements and observations.  Electronically signed by Antony Contras MD on 03/27/2019 at 12:48:21 PM.    Final    [place] EKG SITE RITE  Result Date: 03/27/2019 If Site Rite image not attached, placement could not be confirmed due to current cardiac rhythm.  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  EXAM: TRANSCATHETER THERAPY EMBOLIZATION  COMPARISON:  Diagnostic catheter arteriogram of March 21, 2019.  MEDICATIONS: Heparin 1000 units IV. Ancef 4 g IV antibiotic was administered within 1 hour of the procedure.  ANESTHESIA/SEDATION: General anesthesia.  CONTRAST:  Isovue 300 approximately 150 mL.  FLUOROSCOPY TIME:  Fluoroscopy Time: 101 minutes 0 seconds (4155 mGy).  COMPLICATIONS: None immediate.  TECHNIQUE: Informed written consent was obtained from the patient after a thorough discussion of the procedural risks, benefits and alternatives. All questions were addressed. Maximal Sterile Barrier Technique was utilized including caps, mask, sterile gowns, sterile gloves, sterile drape, hand hygiene and skin antiseptic. A timeout was performed prior to the initiation of the procedure.  Direct access to the left common carotid artery was provided by vascular surgery in the OR due to patient's severe peripheral vascular disease, and absent flow in the  right radial artery.  A 6 French Pinnacle sheath was inserted and sutured in the left common carotid artery in the mid section. The patient was brought to the angio suite with the 6 French Pinnacle sheath.  This was then connected to continuous heparinized saline infusion following flushing. An arteriogram was then obtained of the left common carotid bifurcation and also intracranially.  FINDINGS: The left common carotid bifurcation again demonstrates the left external carotid artery and its major branches to be widely patent.  The left internal carotid artery just distal to the bulb again demonstrates a significant stenosis just distal to the carotid bulb of approximately 70-75%.  More distally, the left internal carotid artery is seen to opacify to the cranial skull base.  The petrous segment is widely patent.  There is a mild to moderate stenosis of the distal cavernous segment of the left  internal carotid artery and to a lesser degree of the supraclinoid left internal carotid artery. A left posterior communicating artery is seen opacifying the left posterior cerebral artery distribution.  The left middle cerebral artery again demonstrates a severe M1 segment stenosis with opacification of the left MCA trifurcation branches into the capillary and venous phases.  The left anterior cerebral artery demonstrates mild arteriosclerotic changes in the proximal A1 and A2 segments.  More distally, mild arteriosclerotic changes are also noted in the A3 segment of the left anterior cerebral artery pericallosal branch.  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  The subsequent capillary and venous phases are unremarkable.  ENDOVASCULAR TREATMENT OF THE LEFT ANTERIOR CEREBRAL ARTERY PERICALLOSAL REGION ANEURYSM WITH STENT ASSISTED COILING  Initial treatment angioplasty of the left internal carotid approximately was performed following advancement of a 5 mm x 30 mm Viatrac 14 angioplasty microcatheter. This had been prepped and purged with heparinized saline infusion. Over a 014 inch standard Synchro micro guidewire with a J configuration, the combination of the Viatrac 14 angioplasty balloon catheter in the micro guidewire advanced to the proximal left internal carotid artery.  Using a torque device, the micro guidewire was advanced without difficulty to the distal cervical segment of the left internal carotid artery.  This was then followed by the advancement of the Viatrac 5 x 30 mm balloon in place adequate distance at the site of severe stenosis.  Slow control angioplasty was then performed using micro inflation syringe device via micro tubing. Balloon was inflated to 8.3 atmospheres where it was maintained for approximately 1 minute. The balloon was then deflated and advanced more distally. A control arteriogram  performed through 5 French Pinnacle sheath in the left common carotid artery again demonstrated significantly improved caliber and flow through the angioplastied segment.  The combination of the balloon and the microcatheter were advanced to the horizontal petrous segment of the left internal carotid artery.  The micro guidewire was removed, and the Viatrac balloon was then exchanged for an 014 inch 300 cm Softip Transend exchange guidewire. Over the exchange micro guidewire, a 5 French 115 cm Catalyst guide catheter was advanced and positioned in the horizontal petrous segment of the left internal carotid artery.  The exchange micro guidewire was then removed. Good aspiration was obtained from the hub of the 5 [place] Catalyst guide catheter in the left internal carotid artery. A control arteriogram performed through the 5 [place] Catalyst guide catheter in the left internal carotid artery demonstrated no changes in the intracranial circulation.  Over a 0.014 inch standard Synchro micro guidewire with a J configuration, a Headway 17 2 tip microcatheter was advanced without difficulty to the supraclinoid left ICA.  The micro guidewire was then gently manipulated with a torque device and advanced through left anterior cerebral artery A1 A2 segments followed by the microcatheter to the A3 segment of the anterior cerebral artery pericallosal branch. The wire was then advanced without difficulty distal to the neck of the aneurysm followed by the microcatheter. The guidewire was removed. Good aspiration obtained from the hub of microcatheter. A gentle control arteriogram performed through the microcatheter demonstrates safe position of tip of the microcatheter. This was then connected to continuous heparinized saline infusion. At this time after having obtained measurements of the pericallosal artery distal and proximal to the aneurysm, a 2.5 mm x 23 mm Lvis junior stent was advanced to the distal end of the  microcatheter.  Under constant fluoroscopic guidance using roadmap technique, the delivery microcatheter was retrieved unsheathing the distal and then the proximal Lvis stent. A control arteriogram performed through the 5 [place] Catalyst guide catheter now advanced to the A1 segment of the left anterior cerebral artery demonstrates safe positioning and excellent apposition of the Lvis stent. At this time, the 014 inch standard Synchro micro guidewire was then advanced through the microcatheter into the aneurysm without difficulty followed by the microcatheter. The guidewire was removed. Good aspiration was obtained with the intra aneurysmal microcatheter. This was then connected to continuous heparinized saline infusion.  The first coil utilized for aneurysm embolization was a Microplex 10 5 mm x 15 mm HyperSoft 3D coil. This was advanced using biplane roadmap technique and constant fluoroscopic guidance in a coaxial manner and with constant heparinized saline infusion to the distal end of the intra-aneurysmal microcatheter. This coil was then advanced under constant fluoroscopic guidance. Prior to its detachment, a control arteriogram performed through the 5 [place] Catalyst guide catheter in the left anterior cerebral artery demonstrates safe positioning of the coil. This was then detached without difficulty. This was subsequently followed by advancement of a 4 mm x 8 mm HyperSoft 3D coil, a 2.5 mm x 8 cm HyperSoft 3D coil, a 3 mm x 6 cm HyperSoft 3D coil, a 3 mm x 4 cm Microplex HyperSoft 3D coil and finally a 2 mm x 6 cm Microplex HyperSoft helical coil. Each of these coils was advanced into the aneurysm under fluoroscopic guidance using biplane roadmap technique. Prior to their detachment, a control arteriogram was performed through the guide catheter to ensure safe positioning of the coil mass.  Following the final coil there was near complete obliteration of the aneurysm.  The microcatheter was gently  retrieved from the coil mass without any difficulty. A control arteriogram performed demonstrated near complete occlusion of the aneurysm with wide patency of the Lvis stent across the neck of the aneurysm.  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  Using a torque device, access through the nearly completely occluded left middle cerebral artery was achieved with the micro guidewire which was advanced to the distal M2 M3 region of the inferior division. The balloon  was then advanced without difficulty and positioned such that the proximal and the distal markers were adequate distant and covered the segmental high-grade stenosis.  A control angioplasty was then performed using micro inflation syringe device via micro tubing. Slow increments were performed in the atmospheric pressures of the balloon to 4.9 atmospheres achieving approximately 1.95 mm diameter of the balloon. This was maintained for approximately 90 seconds. The balloon was then gently deflated and retrieved proximally whilst the wire was maintained distally in the M2 M3 region.  A control arteriogram performed through the 5 [place] Catalyst guide catheter demonstrated significantly improved caliber and flow through the angioplastied segment. The Gateway balloon was then advanced to the M2 M3 region over the micro guidewire. The micro guidewire was then retrieved. Free aspiration of blood was noted from the hub of the Gateway microcatheter.  This was in turn exchanged for a 014 inch 300 cm Softip Transend exchange micro guidewire using biplane  roadmap technique and constant fluoroscopic guidance. The micro guidewire had a J shaped configuration to avoid dissections or inducing spasm.  A control arteriogram performed through the Catalyst guide catheter in the left internal carotid artery following the exchange micro guidewire placement demonstrated significantly improved caliber and flow through the angioplastied segment to approximately 60-70% patency.  It was elected to proceed with placement of a Wingspan stent across the angioplastied segment of the left middle cerebral artery.  A 3.5 mm x 20 mm Wingspan system was then prepped and purged with heparinized saline infusion in its housing.  The inner core was purged with heparinized saline infusion. Also the delivery microcatheter was purged with heparinized saline infusion and retrogradely via a Tuohy Borst and a 3 way stopcock to ensure absence of any air.  The system was then advanced as a unit over the exchange micro guidewire without difficulty.  The distal and the proximal markers of the stent were then advanced such that they were adequate distant at the site of the angioplastied segment.  The Tuohy Borst at the hub of the microcatheter was then loosened. The inner core was then advanced with its butt at the proximal portion of the stent. Thereafter, whilst holding the inner core of the stent, the delivery microcatheter was retrieved unsheathing the distal and then the proximal portion of the stent. Once delivered, a control arteriogram performed through the 5 [place] Catalyst guide catheter demonstrated excellent apposition of the stent and coverage proximally and distally. Wide patency of the left middle cerebral artery angioplasty and stent segment was seen of almost 70-80%.  The delivery micro guidewire was then retrieved and removed.  Control arteriograms performed through the left internal carotid artery continued to demonstrate excellent flow through left MCA distribution without  evidence of intraluminal filling defects or of occlusions involving the anterior or the middle cerebral artery distributions.  6 mg of intra-arterial Integrilin were given following the placement of the Wingspan stent system.  A control arteriogram performed through the 6 French Pinnacle sheath following removal of the Catalyst guide catheter now demonstrated the previously angioplastied segment of the left internal carotid artery proximally. The exchange micro guidewire had been maintained distally in the horizontal petrous segment.  Noted was modest recoil at the site of the previous angioplasty. It was, therefore, decided to proceed with placement of a stent across the angioplastied segment of the previously severely stenotic left internal carotid artery.  Over a 0.014 inch exchange micro guidewire, with the distal end in the petrous horizontal segment, measurements were  performed of the proximal and the distal landing zones of the planned stent. After evaluating the measurements obtained, it was decided to proceed with placement of a 9-7 mm x 40 mm Xact stent. This was prepped and purged retrogradely with heparinized saline infusion. Using the rapid exchange technique, the stent delivery system was advanced without difficulty and positioned such that the distal and the proximal markers were adequate distant from the site of the previous angioplasty segment. This stent was then deployed without any difficulty in the usual manner. The delivery catheter system was then retrieved and removed whilst the exchange micro guidewire was maintained distally.  A control arteriogram performed through the 6 French Pinnacle sheath immediately and 10 and 20 minutes placement of the stent continued to demonstrate excellent flow through the stented segment with mild spasm at its distal end. Because of the risk of development of platelet aggregation within the stent, it was decided to give additional 6 mg of Integrilin  intra-arterially.  A final control arteriogram performed approximately 25 minutes following placement of the left internal carotid artery stent continued to demonstrate excellent flow intracranially and extra cranially. 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 The left middle cerebral artery angioplasty in the stented segment now demonstrated close to 80% patency without gross evidence of intraluminal filling defects or of occlusions.  Throughout the procedure, the patient's blood pressure and neurological status remained stable. The  patient's ACT was maintained in the region of approximately 130 seconds throughout the procedure.  A flat panel CT of the brain performed with the patient on the table demonstrated no evidence of intracranial hemorrhage, mass effect or midline shift.  The patient was then returned to the OR for removal of the 6 French Pinnacle sheath by vascular surgery as per plan.  It was planned to a keep the patient intubated on ventilator for airway protection given the antiplatelets and anticoagulation given for the entirety of the procedure.  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  PLAN: Patient to remain intubated for airway protection given the direct left carotid artery access and use of antiplatelets, and anticoagulation.   Electronically Signed   By: Luanne Bras M.D.   On: 03/28/2019 09:53   IR NEURO EACH ADD'L AFTER BASIC UNI LEFT (MS)  Result Date: 03/26/2019 CLINICAL DATA:  Symptomatic high-grade stenosis of  the left middle cerebral artery proximal M1 segment with history of recent ischemic stroke.  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  EXAM: TRANSCATHETER THERAPY EMBOLIZATION  COMPARISON:  Diagnostic catheter arteriogram of March 21, 2019.  MEDICATIONS: Heparin 1000 units IV. Ancef 4 g IV antibiotic was administered within 1 hour of the procedure.  ANESTHESIA/SEDATION: General anesthesia.  CONTRAST:  Isovue 300 approximately 150 mL.  FLUOROSCOPY TIME:  Fluoroscopy Time: 101 minutes 0 seconds (4155 mGy).  COMPLICATIONS: None immediate.  TECHNIQUE: Informed written consent was obtained from the patient after a thorough discussion of the procedural risks, benefits and alternatives. All questions were addressed. Maximal Sterile  Barrier Technique was utilized including caps, mask, sterile gowns, sterile gloves, sterile drape, hand hygiene and skin antiseptic. A timeout was performed prior to the initiation of the procedure.  Direct access to the left common carotid artery was provided by vascular surgery in the OR due to patient's severe peripheral vascular disease, and absent flow in the right radial artery.  A 6 French Pinnacle sheath was inserted and sutured in the left common carotid artery in the mid section. The patient was brought to the angio suite with the 6 French Pinnacle sheath.  This was then connected to continuous heparinized saline infusion following flushing. An arteriogram was then obtained of the left common carotid bifurcation and also intracranially.  FINDINGS: The left common carotid bifurcation again demonstrates the left external carotid artery and its major branches to be widely patent.  The left internal carotid artery just distal to the bulb again demonstrates a significant  stenosis just distal to the carotid bulb of approximately 70-75%.  More distally, the left internal carotid artery is seen to opacify to the cranial skull base.  The petrous segment is widely patent.  There is a mild to moderate stenosis of the distal cavernous segment of the left internal carotid artery and to a lesser degree of the supraclinoid left internal carotid artery. A left posterior communicating artery is seen opacifying the left posterior cerebral artery distribution.  The left middle cerebral artery again demonstrates a severe M1 segment stenosis with opacification of the left MCA trifurcation branches into the capillary and venous phases.  The left anterior cerebral artery demonstrates mild arteriosclerotic changes in the proximal A1 and A2 segments.  More distally, mild arteriosclerotic changes are also noted in the A3 segment of the left anterior cerebral artery pericallosal branch.  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  The subsequent capillary and venous phases are unremarkable.  ENDOVASCULAR TREATMENT OF THE LEFT ANTERIOR CEREBRAL ARTERY PERICALLOSAL REGION ANEURYSM WITH STENT ASSISTED COILING  Initial treatment angioplasty of the left internal carotid approximately was performed following advancement of a 5 mm x 30 mm Viatrac 14 angioplasty microcatheter. This had been prepped and purged with heparinized saline infusion. Over a 014 inch standard Synchro micro guidewire with a J configuration, the combination of the Viatrac 14 angioplasty balloon catheter in the micro guidewire advanced to the proximal left internal carotid artery.  Using a torque device, the micro guidewire was advanced without difficulty to the distal cervical segment of the left internal carotid artery.  This was then followed by the advancement of the Viatrac 5 x 30 mm balloon in place adequate distance at the site of severe  stenosis.  Slow control angioplasty was then performed using micro inflation syringe device via micro tubing. Balloon was inflated to 8.3 atmospheres where it was maintained for approximately 1 minute. The balloon was then deflated and advanced more distally. A control arteriogram performed through 5 French Pinnacle sheath in the left common carotid artery again demonstrated significantly improved caliber and flow through the angioplastied segment.  The combination of the balloon and the microcatheter were advanced to the horizontal petrous segment of the left internal carotid artery.  The micro guidewire was removed, and the Viatrac balloon was then exchanged for an 014 inch 300 cm Softip Transend exchange guidewire. Over the exchange micro guidewire, a 5 French 115 cm Catalyst guide catheter was advanced and positioned in the horizontal petrous segment  of the left internal carotid artery.  The exchange micro guidewire was then removed. Good aspiration was obtained from the hub of the 5 [place] Catalyst guide catheter in the left internal carotid artery. A control arteriogram performed through the 5 [place] Catalyst guide catheter in the left internal carotid artery demonstrated no changes in the intracranial circulation.  Over a 0.014 inch standard Synchro micro guidewire with a J configuration, a Headway 17 2 tip microcatheter was advanced without difficulty to the supraclinoid left ICA.  The micro guidewire was then gently manipulated with a torque device and advanced through left anterior cerebral artery A1 A2 segments followed by the microcatheter to the A3 segment of the anterior cerebral artery pericallosal branch. The wire was then advanced without difficulty distal to the neck of the aneurysm followed by the microcatheter. The guidewire was removed. Good aspiration obtained from the hub of microcatheter. A gentle control arteriogram performed through the microcatheter demonstrates safe position of tip  of the microcatheter. This was then connected to continuous heparinized saline infusion. At this time after having obtained measurements of the pericallosal artery distal and proximal to the aneurysm, a 2.5 mm x 23 mm Lvis junior stent was advanced to the distal end of the microcatheter.  Under constant fluoroscopic guidance using roadmap technique, the delivery microcatheter was retrieved unsheathing the distal and then the proximal Lvis stent. A control arteriogram performed through the 5 [place] Catalyst guide catheter now advanced to the A1 segment of the left anterior cerebral artery demonstrates safe positioning and excellent apposition of the Lvis stent. At this time, the 014 inch standard Synchro micro guidewire was then advanced through the microcatheter into the aneurysm without difficulty followed by the microcatheter. The guidewire was removed. Good aspiration was obtained with the intra aneurysmal microcatheter. This was then connected to continuous heparinized saline infusion.  The first coil utilized for aneurysm embolization was a Microplex 10 5 mm x 15 mm HyperSoft 3D coil. This was advanced using biplane roadmap technique and constant fluoroscopic guidance in a coaxial manner and with constant heparinized saline infusion to the distal end of the intra-aneurysmal microcatheter. This coil was then advanced under constant fluoroscopic guidance. Prior to its detachment, a control arteriogram performed through the 5 [place] Catalyst guide catheter in the left anterior cerebral artery demonstrates safe positioning of the coil. This was then detached without difficulty. This was subsequently followed by advancement of a 4 mm x 8 mm HyperSoft 3D coil, a 2.5 mm x 8 cm HyperSoft 3D coil, a 3 mm x 6 cm HyperSoft 3D coil, a 3 mm x 4 cm Microplex HyperSoft 3D coil and finally a 2 mm x 6 cm Microplex HyperSoft helical coil. Each of these coils was advanced into the aneurysm under fluoroscopic guidance using  biplane roadmap technique. Prior to their detachment, a control arteriogram was performed through the guide catheter to ensure safe positioning of the coil mass.  Following the final coil there was near complete obliteration of the aneurysm.  The microcatheter was gently retrieved from the coil mass without any difficulty. A control arteriogram performed demonstrated near complete occlusion of the aneurysm with wide patency of the Lvis stent across the neck of the aneurysm.  ENDOVASCULAR STENT ASSISTED ANGIOPLASTY OF SYMPTOMATIC HIGH-GRADE STENOSIS OF THE LEFT MIDDLE CEREBRAL ARTERY M1 SEGMENT  Over a 0.014 inch standard Synchro micro guidewire, a 2 mm x 15 mm Gateway angioplasty balloon catheter which had been prepped with 50% contrast and 50% heparinized saline infusion was  advanced through the 5 [place] Catalyst guide catheter in the petrous segment of the left internal carotid artery to the supraclinoid left ICA.  Using a torque device, access through the nearly completely occluded left middle cerebral artery was achieved with the micro guidewire which was advanced to the distal M2 M3 region of the inferior division. The balloon was then advanced without difficulty and positioned such that the proximal and the distal markers were adequate distant and covered the segmental high-grade stenosis.  A control angioplasty was then performed using micro inflation syringe device via micro tubing. Slow increments were performed in the atmospheric pressures of the balloon to 4.9 atmospheres achieving approximately 1.95 mm diameter of the balloon. This was maintained for approximately 90 seconds. The balloon was then gently deflated and retrieved proximally whilst the wire was maintained distally in the M2 M3 region.  A control arteriogram performed through the 5 [place] Catalyst guide catheter demonstrated significantly improved caliber and flow through the angioplastied segment. The Gateway balloon was then advanced  to the M2 M3 region over the micro guidewire. The micro guidewire was then retrieved. Free aspiration of blood was noted from the hub of the Gateway microcatheter.  This was in turn exchanged for a 014 inch 300 cm Softip Transend exchange micro guidewire using biplane roadmap technique and constant fluoroscopic guidance. The micro guidewire had a J shaped configuration to avoid dissections or inducing spasm.  A control arteriogram performed through the Catalyst guide catheter in the left internal carotid artery following the exchange micro guidewire placement demonstrated significantly improved caliber and flow through the angioplastied segment to approximately 60-70% patency.  It was elected to proceed with placement of a Wingspan stent across the angioplastied segment of the left middle cerebral artery.  A 3.5 mm x 20 mm Wingspan system was then prepped and purged with heparinized saline infusion in its housing.  The inner core was purged with heparinized saline infusion. Also the delivery microcatheter was purged with heparinized saline infusion and retrogradely via a Tuohy Borst and a 3 way stopcock to ensure absence of any air.  The system was then advanced as a unit over the exchange micro guidewire without difficulty.  The distal and the proximal markers of the stent were then advanced such that they were adequate distant at the site of the angioplastied segment.  The Tuohy Borst at the hub of the microcatheter was then loosened. The inner core was then advanced with its butt at the proximal portion of the stent. Thereafter, whilst holding the inner core of the stent, the delivery microcatheter was retrieved unsheathing the distal and then the proximal portion of the stent. Once delivered, a control arteriogram performed through the 5 [place] Catalyst guide catheter demonstrated excellent apposition of the stent and coverage proximally and distally. Wide patency of the left middle cerebral artery  angioplasty and stent segment was seen of almost 70-80%.  The delivery micro guidewire was then retrieved and removed.  Control arteriograms performed through the left internal carotid artery continued to demonstrate excellent flow through left MCA distribution without evidence of intraluminal filling defects or of occlusions involving the anterior or the middle cerebral artery distributions.  6 mg of intra-arterial Integrilin were given following the placement of the Wingspan stent system.  A control arteriogram performed through the 6 French Pinnacle sheath following removal of the Catalyst guide catheter now demonstrated the previously angioplastied segment of the left internal carotid artery proximally. The exchange micro guidewire had been maintained distally in  the horizontal petrous segment.  Noted was modest recoil at the site of the previous angioplasty. It was, therefore, decided to proceed with placement of a stent across the angioplastied segment of the previously severely stenotic left internal carotid artery.  Over a 0.014 inch exchange micro guidewire, with the distal end in the petrous horizontal segment, measurements were performed of the proximal and the distal landing zones of the planned stent. After evaluating the measurements obtained, it was decided to proceed with placement of a 9-7 mm x 40 mm Xact stent. This was prepped and purged retrogradely with heparinized saline infusion. Using the rapid exchange technique, the stent delivery system was advanced without difficulty and positioned such that the distal and the proximal markers were adequate distant from the site of the previous angioplasty segment. This stent was then deployed without any difficulty in the usual manner. The delivery catheter system was then retrieved and removed whilst the exchange micro guidewire was maintained distally.  A control arteriogram performed through the 6 French Pinnacle sheath immediately and 10 and  20 minutes placement of the stent continued to demonstrate excellent flow through the stented segment with mild spasm at its distal end. Because of the risk of development of platelet aggregation within the stent, it was decided to give additional 6 mg of Integrilin intra-arterially.  A final control arteriogram performed approximately 25 minutes following placement of the left internal carotid artery stent continued to demonstrate excellent flow intracranially and extra cranially. 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 The left middle cerebral artery angioplasty in the stented segment now demonstrated close to 80% patency without gross evidence of intraluminal filling defects or of occlusions.  Throughout the procedure, the patient's blood pressure and neurological status remained stable. The  patient's ACT was maintained in the region of approximately 130 seconds throughout the procedure.  A flat panel CT of the brain performed with the patient on the table demonstrated no evidence of intracranial hemorrhage, mass effect or midline shift.  The patient was then returned to the OR for removal of the 6 French Pinnacle sheath by vascular surgery as per plan.  It was planned to a keep the patient intubated on ventilator for airway protection given the antiplatelets and anticoagulation given for the entirety of the procedure.  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  PLAN: Patient to remain intubated for airway protection given the direct left carotid  artery access and use of antiplatelets, and anticoagulation.   Electronically Signed   By: Luanne Bras M.D.   On: 03/28/2019 09:53    Labs:  Basic Metabolic Panel: Recent Labs  Lab 04/21/19 0550  NA 137  K 3.8  CL 104  CO2 24  GLUCOSE 101*  BUN 12  CREATININE 1.08  CALCIUM 8.6*    CBC: Recent Labs  Lab 04/21/19 1220  WBC 4.8  NEUTROABS 3.3  HGB 8.7*  HCT 27.7*  MCV 94.2  PLT 346    CBG: Recent Labs  Lab 04/21/19 1752 04/22/19 0606 04/22/19 1742 04/23/19 0610 04/23/19 1718  GLUCAP 96 91 86 103* 88   Family history.  Mother with CVA.  Denies any hypertension  hyperlipidemia or colon cancer  Brief HPI:   Jeremy Sherman is a 69 y.o. right-handed male with history of hypertension, hyperlipidemia, prediabetes, CAD with CABG 2009, tobacco abuse.  Patient lives with spouse and daughter works full-time as a Glass blower/designer.  Patient had episode of dizziness confusion 03/17/2019.  He went to his PCPs office became unresponsive hypotensive and diaphoretic.  Blood pressure reportedly lower than 100 he was brought by EMS to [facility].  Patient had recent been hospitalized at [facility] 03/11/2019 to 03/13/2019 for hypertensive urgency.  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  TEE at outside hospital showed left atrial appendage thrombus was placed on intravenous heparin.  He was transferred to [facility].  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 Oneida Alar 03/26/2019.  Found to have right hemiparesis following procedure.  Follow-up imaging showed early subacute infarction in the right cerebellar peduncle likely  small vessel disease.  He did remain intubated through 03/27/2019.  He had been receiving Cleviprex for blood pressure control.  Initially on intravenous heparin transition to Eliquis as well as Brilinta.  Patient with persistent low-grade fever urinalysis 03/29/2019 + nitrite placed on Levaquin x5 doses that has since been completed.  Maintained on dysphagia #1 thin liquid diet.  Hospital course findings of elevated TSH level 45.808 placed on Synthroid.  Therapy evaluations completed and patient was admitted for a comprehensive rehab program   Hospital Course: Math Finck was admitted to rehab 04/03/2019 for inpatient therapies to consist of PT, ST and OT at least three hours five days a week. Past admission physiatrist, therapy team and rehab RN have worked together to provide customized collaborative inpatient rehab.  Hospital course pertaining to patient's left ACA pericallosal segment aneurysm underwent stenting coiling as well as left ICA proximal stenosis with CEA followed by stent angioplasty 03/26/2019 per interventional radiology postoperative right cerebellar peduncle infarction patient remained on Eliquis as well as Brilinta.  He would follow-up neurology services as well as interventional radiology.  Patient's blood pressure was currently maintained on no antihypertensive medications.  Lipitor for hyperlipidemia.  History of tobacco abuse he received counts regards to cessation of nicotine products.  Diabetes mellitus hemoglobin A1c 6.4 diabetic teaching.  His diet was slowly advanced to mechanical soft.  Patient was noted significant onychogryphosis/mycosis he would need follow-up outpatient with podiatry services.  In regards to patient's hypothyroidism.  Noted elevated TSH levels maintained on Synthroid he would need follow-up thyroid panel as outpatient.  In regards to patient's BPH difficulty in and out catheterizations with some hematuria related to anticoagulation in light of  retention a Foley catheter tube was placed outpatient referral obtained for urology services for follow-up and Flomax had been discontinued due to orthostasis.   Blood pressures were monitored on TID basis and monitored  Diabetes has been monitored with ac/hs CBG checks and SSI was use prn for tighter BS control.   He/ is continent of bowel and bladder.  He/ has made gains during rehab stay and is attending therapies  He/ will continue to receive follow up therapies   after discharge  Rehab course: During patient's stay in rehab weekly team conferences were held  to monitor patient's progress, set goals and discuss barriers to discharge. At admission, patient required moderate assist to ambulate 7 feet rolling walker, minimal assist stand pivot transfers, minimal assist supine to sit.  Moderate assist upper body bathing max assist lower body bathing moderate assist upper body dressing max assist lower body dressing  Physical exam.  Blood pressure 162/95 pulse 74 temperature 98.2 respirations 18 oxygen saturation 99% room air Constitutional.  Well-developed no acute distress HEENT Head.  Normocephalic and atraumatic Eyes.  Pupils round and reactive to light no discharge no nystagmus Neck.  Supple nontender no JVD without thyromegaly Cardiac regular rate rhythm without any extra sounds or murmur heard Abdomen.  Soft nontender positive bowel sounds Respiratory effort normal no respiratory distress without wheeze Musculoskeletal.0/neurological Left upper extremity 5- out of 5 in all muscles tested Right lower extremity hip flexors 2+ out of 5 knee extension knee flexion 3- out of 5 dorsi and plantar flexion 3+ out of 5 Left lower extremity hip flexion 2 out of 5 knee extension knee flexion 3- out of 5 dorsi and plantar flexion 3 out of 5  He/  has had improvement in activity tolerance, balance, postural control as well as ability to compensate for deficits. He/ has had improvement in  functional use RUE/LUE  and RLE/LLE as well as improvement in awareness.  Working with energy conservation techniques.  Supine to sit with supervision.  Stand pivot transfers rolling walker bed to wheelchair contact-guard assist.  Ambulates 150 feet to the rehab gym with contact-guard assist.  Gather his belongings for activities day living and homemaking.  He was able to communicate his needs.  Full family teaching completed plan discharge to home       Disposition: Discharged to home    Diet: Mechanical soft thin liquids  Special Instructions: No driving smoking or alcohol  Follow-up with PCP for thyroid panel  Ambulatory referral urology services for urinary retention.  Foley catheter tube remains in place.   Follow-up outpatient podiatry services  Medications at discharge 1.  Eliquis 5 mg p.o. twice daily 2.  Lipitor 80 mg p.o. daily 3.  Voltaren 2 g 4 times daily 4.  Synthroid 100 mcg p.o. daily 5.  Protonix 40 mg p.o. daily 6.  Brilinta 90 mg p.o. twice daily 7.  Tramadol 50 mg every 6 hours as needed pain  Discharge Instructions    Ambulatory referral to Neurology   Complete by: As directed    An appointment is requested in approximately 4 weeks left ACA pericallosal aneurysm   Ambulatory referral to Physical Medicine Rehab   Complete by: As directed    Moderate complexity follow-up 1 to 2 weeks left ACA pericallosal aneurysm   Ambulatory referral to Podiatry   Complete by: As directed    Multi need for toe nail debridement   Ambulatory referral to Urology   Complete by: As directed    Ambulatory referral urinary retention BPH hematuria related to Eliquis and Brilinta.  Foley catheter tube in place      Follow-up Information    Kirsteins, Luanna Salk, MD Follow up.   Specialty: Physical Medicine and Rehabilitation Why: Office to call for appointment Contact information: [place] [address] [phone]        Luanne Bras,  MD Follow up.   Specialties: Interventional Radiology, Radiology Why: Call for appointment Contact information: [place] [address] [place], [place],  MD Follow up.   Specialties: Vascular Surgery, Cardiology Why: Call for appointment Contact information: [address] [place] [address] [phone]           Signed: Lavon Paganini Lyman 04/24/2019, 5:20 AM

## 2019-04-22 NOTE — Progress Notes (Signed)
[facility] PHYSICAL MEDICINE & REHABILITATION PROGRESS NOTE   Subjective/Complaints:  Symptomatic ortho BP drops yesterday , did not receive flomax last night  Discusse with OT  Pt tired this am , not ready for breakfast yet   ROS: Denies CP, SOB, N/V/D  Objective:   No results found. Recent Labs    04/21/19 1220  WBC 4.8  HGB 8.7*  HCT 27.7*  PLT 346   Recent Labs    04/21/19 0550  NA 137  K 3.8  CL 104  CO2 24  GLUCOSE 101*  BUN 12  CREATININE 1.08  CALCIUM 8.6*    Intake/Output Summary (Last 24 hours) at 04/22/2019 0806 Last data filed at 04/22/2019 0443 Gross per 24 hour  Intake --  Output 925 ml  Net -925 ml     Physical Exam: Vital Signs Blood pressure (!) 171/96, pulse 81, temperature 98.9 F (37.2 C), resp. rate 20, height 6\' 5"  (1.956 m), weight 100.5 kg, SpO2 99 %.  Constitutional: No distress . Vital signs reviewed. HENT: Normocephalic.  Atraumatic. Eyes: EOMI. No discharge. Cardiovascular: No JVD.RRR no murmur  Respiratory: Normal effort.  No stridor.No rales or rhonchi  GI: Non-distended.+ BS soft , NT  Skin: Warm and dry.  Intact. Psych: Normal mood.  Normal behavior. Musc: No edema in extremities.  No tenderness in extremities. Neurologic: Alert Motor: Bilateral upper extremities: 5/5 proximal distal Bilateral lower extremities: 4+/5 proximal to distal, unchanged Assessment/Plan: 1. Functional deficits secondary to L ACA aneurysm, MCA  Stenosis causing Left hemispheric ischemia which require 3+ hours per day of interdisciplinary therapy in a comprehensive inpatient rehab setting.  Physiatrist is providing close team supervision and 24 hour management of active medical problems listed below.  Physiatrist and rehab team continue to assess barriers to discharge/monitor patient progress toward functional and medical goals  Care Tool:  Bathing    Body parts bathed by patient: Right arm, Left arm, Chest, Abdomen, Right upper leg, Left  upper leg, Face, Front perineal area, Right lower leg, Left lower leg   Body parts bathed by helper: Buttocks Body parts n/a: Left lower leg, Right lower leg   Bathing assist Assist Level: Minimal Assistance - Patient > 75%     Upper Body Dressing/Undressing Upper body dressing   What is the patient wearing?: Pull over shirt    Upper body assist Assist Level: Supervision/Verbal cueing    Lower Body Dressing/Undressing Lower body dressing      What is the patient wearing?: Pants, Incontinence brief     Lower body assist Assist for lower body dressing: Moderate Assistance - Patient 50 - 74%     Toileting Toileting    Toileting assist Assist for toileting: Maximal Assistance - Patient 25 - 49%     Transfers Chair/bed transfer  Transfers assist     Chair/bed transfer assist level: Minimal Assistance - Patient > 75%     Locomotion Ambulation   Ambulation assist   Ambulation activity did not occur: Safety/medical concerns  Assist level: Minimal Assistance - Patient > 75% Assistive device: Walker-rolling Max distance: 55'   Walk 10 feet activity   Assist  Walk 10 feet activity did not occur: Safety/medical concerns  Assist level: Minimal Assistance - Patient > 75% Assistive device: Walker-rolling   Walk 50 feet activity   Assist Walk 50 feet with 2 turns activity did not occur: Safety/medical concerns  Assist level: Contact Guard/Touching assist Assistive device: Walker-rolling    Walk 150 feet activity   Assist  Walk 150 feet activity did not occur: Safety/medical concerns         Walk 10 feet on uneven surface  activity   Assist Walk 10 feet on uneven surfaces activity did not occur: Safety/medical concerns         Wheelchair     Assist Will patient use wheelchair at discharge?: (TBD)             Wheelchair 50 feet with 2 turns activity    Assist            Wheelchair 150 feet activity     Assist           Blood pressure (!) 171/96, pulse 81, temperature 98.9 F (37.2 C), resp. rate 20, height 6\' 5"  (1.956 m), weight 100.5 kg, SpO2 99 %.    Medical Problem List and Plan:  1. 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 Antithrombotics:  -DVT/anticoagulation: Eliquis  -antiplatelet therapy: Brilinta  3. Pain Management: Tramadol as needed   Controlled on 2/21 4. Mood: Provide emotional support  -antipsychotic agents: N/A  5. Neuropsych: This patient is capable of making decisions on his own behalf.  6. Skin/Wound Care: Routine skin checks  7. Fluids/Electrolytes/Nutrition: Routine in and outs 8.  Elevated blood pressure readings. Patient on no home med antihypertensive medications. Monitor with increased mobility  Vitals:   04/21/19 2034 04/22/19 0432  BP: (!) 145/87 (!) 171/96  Pulse: 75 81  Resp: 18 20  Temp: 98.8 F (37.1 C) 98.9 F (37.2 C)  SpO2: 100% 99%   BPelevated in [place] this am , ortho vitals ordered but not completed  9. Hyperlipidemia. Lipitor  10. Tobacco abuse. Counseling  11.  Diabetes mellitus type 2. Hemoglobin A1c 6.4. SSI. Patient on no diabetic agents prior to admission. Well controlled no meds CBG (last 3)  Recent Labs    04/21/19 0621 04/21/19 1752 04/22/19 0606  GLUCAP 88 96 91    controlled on 2/23 12. BPH. Flomax 0.4 mg daily  d/ced again due to orthostasis . Urinary retention with painful caths accompanied by hematuria , foley replaced, f/u with urology as outpatient 13. Hypothyroidism. TSH 45.  Synthroid started. Patient will need follow-up thyroid panel in 3 months 14.  Severe PAD with R foot ischemia  15.  Onychogryphosis/mycosis-severe OP DPM f/u as they are not availabe  for consults in hospital   16. Left post heel pain, ? achiiles tendinopathy, prevalon boot and diclofenac gel    ?  Improving 17.  Transaminitis  LFTs elevated on 2/5, labs ordered for tomorrow 18.  Hypoalbuminemia  Supplement initiated on 2/20 19.  Acute blood loss anemia  Slowly improving 20.  Post stroke dysphagia  D3 thins, advance as tolerated  LOS: 19 days A FACE TO Mayo E Tikita Mabee 04/22/2019, 8:06 AM

## 2019-04-22 NOTE — Progress Notes (Signed)
Physical Therapy Session Note  Patient Details  Name: Jeremy Sherman MRN: [id number] Date of Birth: September 25, 1950  Today's Date: 04/22/2019 PT Individual Time: 0920-1000 and 1500-1545 PT Individual Time Calculation (min): 40 min and 45 min    Short Term Goals: Week 3:  PT Short Term Goal 1 (Week 3): = to LTGs based on ELOS  Skilled Therapeutic Interventions/Progress Updates:    Session 1: Patient supine in bed eating breakfast upon PT arrival, agreeable the therapy tx if breakfast warmed up afterwards, denies pain. Therapist donned BLE ace wrapping, and shoes totalA. Supine > sit with supervision, therapist managed position of folly bag. Stand pivot transfer with RW from bed > w/c with CGA and cues for sequencing and positioning in walker with turning. Pt reported feeling light headed, therapist assessed vitals: BP 147/74 HR 67.  Pt transported in w/c to rehab gym hallway for time management. Sit > stand with RW and with CGA with additional time and cues for hand placement for push off. Pt ambulated 150' to rehab car with CGA, when pt began to fatigue required minA for RW management, w/c follow d/t first time ambulated longer distance in multiple days. Stand > sit with minA d/t pt attempting to sit with RW pushed out too far in front and without reaching back, therapist provided max cueing for RW positioning and sequencing for safety. Pt performed car transfer with supervision taking additional time. Therapist lowered car after pt reported having a low car, therapist will confirm with pt's wife, therapist then lowered car fully. Sit > standing at RW with maxA out of low car, cues for sequencing > stand pivot transfer to w/c with CGA. Transported pt back to room in w/c, therapist warmed up pt's breakfast, left in w/c with needs in reach and chair alarm set.   Session 2: Patient seated in w/c upon PT arrival, agreeable to therapy tx, denies pain. Pt's wife present throughout session for family  education. Pt transported to rehab gym in w/c for time management. Sitting in w/c > standing in front of stairs with CGA, cues for sequencing. Therapist verbalized cues to pt's wife that assist pt when coming to stand (I.e. where to push off with hands, scooting hips forward, having feet under self vs out in front). Pt ascended/descended 4 steps alternating pattern ascending and step to descending with B rails and CGA-minA from therapist, cues for sequencing. Stand > sit in w/c with CGA, extensive cues for sequencing. Repeated 4 steps with pt's wife as assist and cueing from therapist for hand placement and positioning of pt's wife while pt is negotiating stairs for optimal safety. Therapist discussed having a chair at the top of the steps and bottom depending on if pt is going in/out of the house in case he needs a rest break - discussed general planning of environment prior to pt getting up from chair in any situation. Therapist also discussed placement of foley bag, wanting to position bag prior to pt movement to avoid tugging. Pt ambulated 39' with RW and therapist while therapist discussed optimal positioning for guarding and positioning of hand on RW to help manage to prevent it getting out too far in front of pt, pt had a stand rest break while therapist and pt's wife switched to allow her to guard, pt ambulated another 78' to car in ortho gym. Pt performed car transfer with CGA-minA for LE management provided by pt's wife, she also gave sequencing cues to assist pt when coming out of  the car. Sit > stand at RW with CGA > ambulated 3' to w/c with CGA > transported pt back to room in w/c. While pt seated, therapist doffed LLE ace wrapping to demonstrate wrapping technique to pt's wife. Therapist donned first ace wrap giving cues and educating pt's wife on importance of putting BLE ace wrappings on prior to pt getting up/OOB and to also donn abdominal binder to dec light headedness, then pt's wife demonstrated  ability to continue ace wrapping with second wrap. Therapist asked pt's wife if she was comfortable with everything discussed today and if she had any questions, she stated that she was comfortable with no questions. Pt left in w/c with needs in reach and chair alarm set.   Therapy Documentation Precautions:  Precautions Precautions: Fall Precaution Comments: monitor BP Restrictions Weight Bearing Restrictions: No   Therapy/Group: Individual Therapy  Juliann Pulse SPT 04/22/2019, 7:26 AM

## 2019-04-22 NOTE — Plan of Care (Signed)
  Problem: Consults Goal: RH STROKE PATIENT EDUCATION Description: See Patient Education module for education specifics  Outcome: Progressing   Problem: RH BOWEL ELIMINATION Goal: RH STG MANAGE BOWEL WITH ASSISTANCE Description: STG Manage Bowel with mod I Assistance. Outcome: Progressing   Problem: RH BLADDER ELIMINATION Goal: RH STG MANAGE BLADDER WITH ASSISTANCE Description: STG Manage Bladder With MAX Assistance Outcome: Progressing   Problem: RH SKIN INTEGRITY Goal: RH STG MAINTAIN SKIN INTEGRITY WITH ASSISTANCE Description: STG Maintain Skin Integrity With Mod I Assistance. Outcome: Progressing   Problem: RH SAFETY Goal: RH STG ADHERE TO SAFETY PRECAUTIONS W/ASSISTANCE/DEVICE Description: STG Adhere to Safety Precautions With cues and reminders  Outcome: Progressing   Problem: RH PAIN MANAGEMENT Goal: RH STG PAIN MANAGED AT OR BELOW PT'S PAIN GOAL Description: Pain level less that 4 on scale of 0-10 Outcome: Progressing   Problem: RH KNOWLEDGE DEFICIT Goal: RH STG INCREASE KNOWLEDGE OF HYPERTENSION Description: Pt will be able to adhere to medication regimen, dietary and lifestyle modification to control blood glucose and hyperlipidemia with mod I assist upon discharge.  Outcome: Progressing Goal: RH STG INCREASE KNOWLEGDE OF HYPERLIPIDEMIA Description: Pt will be able to adhere to medication regimen, dietary and lifestyle modification to control blood glucose and hyperlipidemia with mod I assist upon discharge.  Outcome: Progressing Goal: RH STG INCREASE KNOWLEDGE OF STROKE PROPHYLAXIS Description: Pt will be able to adhere to medication regimen, dietary and lifestyle modification to control blood glucose and hyperlipidemia with mod I assist upon discharge.  Outcome: Progressing

## 2019-04-22 NOTE — Progress Notes (Signed)
Speech Language Pathology Daily Session Note  Patient Details  Name: Jeremy Sherman MRN: [id number] Date of Birth: 12-Jan-1951  Today's Date: 04/22/2019 SLP Individual Time: 1400-1458 SLP Individual Time Calculation (min): 58 min  Short Term Goals: Week 3: SLP Short Term Goal 1 (Week 3): STG=LTG due to remaining LOS  Skilled Therapeutic Interventions: Pt was seen for skilled ST targeting cognitive goals and education with pt and his wife (primary caregiver). SLP facilitated session with functional conversation regarding ST goals and progress. Discussed current Dysphagia 3 (mechanical soft) texture and thin liquid diet, rationale, and impact of attention on intake. SLP also provided explanation and handout regarding compensatory strategies for short term memory with emphasis on routines, external aids and potential to use journaling/notetaking at home, no multitasking, etc. SLP also reinforced need for 24/7 supervision and assistance with ADLs involving high cognitive demand such as medication and finance management, cooking, etc. SLP did review activities that would be safe for pt to engage in and still engaging/mentally stimulating for pt such as reading, puzzles, card games, simple meal prep (ex: washing vegetables, tearing lettuce, etc.). Wife verbally acknowledged 24/7 supervision recommendation and feels confident she and other family members can provide that.  SLP further facilitated session with a familiar basic money management task from the [place], which pt demonstrated vast improvements in basic problem solving, error awareness, and recall within, requiring only Min A verbal and visual cues. During activity, several additional educational opportunities regarding emergent awareness, problem solving, and recall occurred and wife was receptive to these examples. Pt also requested to use restroom during session, and with RN's assistance was transferred from wheelchair to toilet, during  which he exhibited excellent safety awareness with only Supervision A verbal cues from clinician. Pt left sitting in wheelchair with alarm set and needs within reach, wife still present and questions answered to their satisfaction. Continue per current plan of care.       Pain Pain Assessment Pain Scale: 0-10 Pain Score: 0-No pain  Therapy/Group: Individual Therapy  Arbutus Leas 04/22/2019, 3:02 PM

## 2019-04-23 ENCOUNTER — Inpatient Hospital Stay (HOSPITAL_COMMUNITY): Payer: BC Managed Care – PPO

## 2019-04-23 ENCOUNTER — Inpatient Hospital Stay (HOSPITAL_COMMUNITY): Payer: BC Managed Care – PPO | Admitting: Speech Pathology

## 2019-04-23 ENCOUNTER — Inpatient Hospital Stay (HOSPITAL_COMMUNITY): Payer: BC Managed Care – PPO | Admitting: Occupational Therapy

## 2019-04-23 LAB — GLUCOSE, CAPILLARY
Glucose-Capillary: 103 mg/dL — ABNORMAL HIGH (ref 70–99)
Glucose-Capillary: 88 mg/dL (ref 70–99)

## 2019-04-23 MED ORDER — AMMONIUM LACTATE 12 % EX LOTN: TOPICAL_LOTION | Freq: Two times a day (BID) | CUTANEOUS | 0 refills | Status: DC

## 2019-04-23 MED ORDER — TRAMADOL HCL 50 MG PO TABS: 50.0000 mg | ORAL_TABLET | Freq: Four times a day (QID) | ORAL | 0 refills | Status: DC | PRN

## 2019-04-23 MED ORDER — TICAGRELOR 90 MG PO TABS: 90.0000 mg | ORAL_TABLET | Freq: Two times a day (BID) | ORAL | 0 refills | Status: AC

## 2019-04-23 MED ORDER — SENNA 8.6 MG PO TABS: 1.0000 | ORAL_TABLET | Freq: Every day | ORAL | 0 refills | Status: AC

## 2019-04-23 MED ORDER — LEVOTHYROXINE SODIUM 100 MCG PO TABS: 100.0000 ug | ORAL_TABLET | Freq: Every day | ORAL | 0 refills | Status: AC

## 2019-04-23 MED ORDER — PANTOPRAZOLE SODIUM 40 MG PO TBEC: 40.0000 mg | DELAYED_RELEASE_TABLET | Freq: Every day | ORAL | 0 refills | Status: DC

## 2019-04-23 MED ORDER — POLYETHYLENE GLYCOL 3350 17 G PO PACK: 17.0000 g | PACK | Freq: Every day | ORAL | 0 refills | Status: AC

## 2019-04-23 MED ORDER — DICLOFENAC SODIUM 1 % EX GEL: 2.0000 g | Freq: Four times a day (QID) | CUTANEOUS | 0 refills | Status: AC

## 2019-04-23 MED ORDER — ATORVASTATIN CALCIUM 80 MG PO TABS: 80.0000 mg | ORAL_TABLET | Freq: Every day | ORAL | 0 refills | Status: AC

## 2019-04-23 MED ORDER — APIXABAN 5 MG PO TABS: 5.0000 mg | ORAL_TABLET | Freq: Two times a day (BID) | ORAL | 0 refills | Status: AC

## 2019-04-23 NOTE — Progress Notes (Signed)
Speech Language Pathology Discharge Summary  Patient Details  Name: Jeremy Sherman MRN: [id number] Date of Birth: 1950-08-27  Today's Date: 04/23/2019 SLP Individual Time: 1103-1200 SLP Individual Time Calculation (min): 57 min   Skilled Therapeutic Interventions:  Pt was seen for skilled ST targeting cognitive and dysphagia goals. SLP facilitated session with readministration of MOCA version 7.1, which was initially administered on day of CIR evaluation on 04/04/19. Pt's score on MOCA today increased 9 points (total of 19/30) since day of evaluation, with most noteworthy improvements in executive function, problem solving, selective attention, and orientation. Pt was also able to recall 4/5 words in short term recall subsection, however required categorical or multiple choice cues in order to do so for 3/4 words recalled. This is still indicative of improvement in comparison to first eval, during which pt was only able to recall 1/5 words with Max A verbal cues. Results and emphasis on areas of improvement as well as areas with room for more progress in follow up therapy discussed with pt.  SLP also provided a dysphagia 3 (mechanical soft) texture snack with thin liquids, during which pt demonstrated ability to use swallow precautions Mod I (surpassing Supervision level LTG in this area), and no overt s/sx aspiration were observed. Pt left sitting in wheelchair with alarm set and needs within reach. Continue per current plan of care.      Patient has met 5 of 6 long term goals.  Patient to discharge at overall Min;Mod level.  Reasons goals not met: short term memory impairment and Mod a still required to use aids and/or compensatory strategies   Clinical Impression/Discharge Summary:   Pt made functional gains and met 5 out of 6 long term goals this admission. Pt currently requires primarily Min assist for basic cognitive tasks, however Mod A still required for short term recall of new  information. Pt will required 24/7 supervision. Pt is consuming Dysphagia 3 (mech soft) diet with thin liquids. Pt has demonstrated improved oral swallow function, sustained and selective attention, basic problem solving, and orientation. However, given mild-mod cognitive deficits still present, recommend pt continue to receive skilled ST services upon discharge (although no follow up for dysphagia is necessary or indicated). Pt and family education is complete at this time.   Care Partner:  Caregiver Able to Provide Assistance: Yes  Type of Caregiver Assistance: Cognitive  Recommendation:  Home Health SLP;24 hour supervision/assistance  Rationale for SLP Follow Up: Maximize cognitive function and independence;Reduce caregiver burden;Maximize swallowing safety   Equipment: none   Reasons for discharge: Discharged from hospital   Patient/Family Agrees with Progress Made and Goals Achieved: Yes    Arbutus Leas 04/23/2019, 7:17 AM

## 2019-04-23 NOTE — Progress Notes (Signed)
Physical Therapy Discharge Summary  Patient Details  Name: Jeremy Sherman MRN: [id number] Date of Birth: 1951-01-27  Today's Date: 04/23/2019 PT Individual Time: 1302-1415 PT Individual Time Calculation (min): 73 min     Patient has met 9 of 9 long term goals due to improved activity tolerance, improved balance, improved postural control and increased strength.  Patient to discharge at an ambulatory level Supervision.   Patient's care partner requires assistance to provide the necessary physical and cognitive assistance at discharge.  Reasons goals not met: N/A  Recommendation:  Patient will benefit from ongoing skilled PT services in home health setting to continue to advance safe functional mobility, address ongoing impairments in strength, dynamic standing balance, transfers, monitor BP and minimize fall risk.  Equipment: RW, 18x18 w/c  Reasons for discharge: treatment goals met  Patient/family agrees with progress made and goals achieved: Yes  Skilled Physical Therapy Interventions Patient seated in w/c upon PT arrival, agreeable to therapy tx, denies pain. Therapist donned B shoes maxA with pt able to push heel down into shoe once on. Transported in w/c to rehab apartment for time management. Sit > stand at [facility] with supervision and gait 5' with CGA to recliner. Stand > sit with CGA and cues for controlled decent and sequencing. Sit > stand with mod-maxA from low recliner seat for trunk upright and LE extension. Pt ambulated 53' with RW and CGA to ortho gym and performed stand > sit on mat table with supervision. Pt ambulated 15' with RW to car and performed car transfer with supervision, additional time required, cues for sequencing. Sit > standing at RW with supervision > pt ambulated up/down ramp with minA for RW management and cues for positioning in RW. Pt reported going to the bathroom when standing, therapist brought w/c behind pt > stand > sit in w/c with supervision.  Transported pt back to room for toileting. Pt ambulated 5' with RW into bathroom with CGA, performed standing balance in front of toilet with RW in front to allow therapist assist for clothing management. Stand > sit with CGA and cues for positioning. Therapist at distant supervision to allow for privacy. While pt sitting, therapist doffed B shoes and pants d/t soiling them and donned new pants and B shoes totalA for time management. Sit > stand at [facility] with supervision, pt performed standing balance with intermittent unilateral UE support and performed peri-care with supervision, therapist donned new brief total A. Pt ambulated 5' with RW out of bathroom to w/c that will be going home with him to assess height and overall fit. Stand > sit with supervision in new w/c. Therapist transported pt to in sink for him tto perform hand hygiene with supervision. Therapist assessed height of walker being sent home with pt tomorrow to make sure correct height already set. Pt performed sit > stand at [facility] with supervision to then ambulate to hospital w/c, however, reported he went to the bathroom again upon standing up. Pt ambulated to 5' with RW to bathroom with CGA, performed standing balance for therapist to perform clothing management, stand > sit with supervision and cues for positioning. NT notified of pt needing assistance d/t therapy session ending and therapist needing to see another pt. Pt left sitting on toilet with NT present.     PT Discharge Precautions/Restrictions Precautions Precautions: Fall Precaution Comments: monitor BP Restrictions Weight Bearing Restrictions: No Cognition Overall Cognitive Status: Impaired/Different from baseline Arousal/Alertness: Awake/alert Orientation Level: Oriented X4 Attention: Selective Focused Attention: Appears intact Sustained  Attention: Appears intact Selective Attention: Impaired Memory: Impaired Memory Impairment: Decreased short term memory Awareness:  Impaired Awareness Impairment: Emergent impairment Problem Solving: Impaired Problem Solving Impairment: Verbal basic;Functional basic Reasoning: Impaired Safety/Judgment: Appears intact Sensation Sensation Light Touch: Impaired Detail Light Touch Impaired Details: Impaired RLE Proprioception: Impaired by gross assessment Additional Comments: not formally assessed however d/t pt demonstrated dec standing balance/proprioception potentially 2/2 B feet condition Coordination Gross Motor Movements are Fluid and Coordinated: No Fine Motor Movements are Fluid and Coordinated: No Motor  Motor Motor: Abnormal postural alignment and control Motor - Discharge Observations: generalized weakness  Mobility Bed Mobility Bed Mobility: Supine to Sit;Rolling Left;Rolling Right;Sit to Supine Rolling Right: Supervision/verbal cueing Rolling Left: Supervision/Verbal cueing Supine to Sit: Supervision/Verbal cueing Sit to Supine: Supervision/Verbal cueing Transfers Transfers: Sit to Stand;Stand to Sit Sit to Stand: Supervision/Verbal cueing Stand to Sit: Supervision/Verbal cueing Stand Pivot Transfers: Supervision/Verbal cueing Stand Pivot Transfer Details: Visual cues/gestures for sequencing;Verbal cues for technique;Verbal cues for precautions/safety;Verbal cues for sequencing;Verbal cues for safe use of DME/AE Transfer (Assistive device): Rolling walker Locomotion  Gait Ambulation: Yes Gait Assistance: Contact Guard/Touching assist Gait Distance (Feet): 150 Feet Assistive device: Rolling walker Gait Gait: Yes Gait Pattern: Impaired Gait Pattern: Step-to pattern;Decreased step length - left;Decreased step length - right;Decreased hip/knee flexion - right;Decreased hip/knee flexion - left;Decreased dorsiflexion - right;Decreased dorsiflexion - left;Trunk flexed Gait velocity: decr Stairs / Additional Locomotion Stairs: Yes Stairs Assistance: Contact Guard/Touching assist Stair Management  Technique: Two rails Number of Stairs: 4 Height of Stairs: 6 Wheelchair Mobility Wheelchair Mobility: No  Trunk/Postural Assessment  Cervical Assessment Cervical Assessment: Within Functional Limits Thoracic Assessment Thoracic Assessment: Exceptions to WFL(rounded shoulders) Lumbar Assessment Lumbar Assessment: Exceptions to WFL(posterior pelvic tilt) Postural Control Postural Control: Deficits on evaluation Postural Limitations: flexed trunk in standing (improved since initial evaluation), inc WB through BUE during gait  Balance Balance Balance Assessed: Yes Static Sitting Balance Static Sitting - Level of Assistance: 5: Stand by assistance Dynamic Sitting Balance Dynamic Sitting - Level of Assistance: 5: Stand by assistance Static Standing Balance Static Standing - Level of Assistance: 5: Stand by assistance Dynamic Standing Balance Dynamic Standing - Level of Assistance: 5: Stand by assistance(CGA) Extremity Assessment  RUE Assessment RUE Assessment: Within Functional Limits Active Range of Motion (AROM) Comments: WFLs General Strength Comments: WFL based on pt performance during functional mobility LUE Assessment LUE Assessment: Within Functional Limits Active Range of Motion (AROM) Comments: WFL General Strength Comments: WFL based on pt performance during functional mobility RLE Assessment RLE Assessment: Exceptions to [facility] Active Range of Motion (AROM) Comments: decreased ankle DF ROM - only able to get to neutral General Strength Comments: Approximated to be 4-/5 based on pt performance during functional mobility LLE Assessment LLE Assessment: Exceptions to [facility] General Strength Comments: Approximated to be 4-/5 based on pt performance during functional mobility    Jeston Junkins SPT 04/23/2019, 7:36 AM

## 2019-04-23 NOTE — Plan of Care (Signed)
  Problem: Consults Goal: RH STROKE PATIENT EDUCATION Description: See Patient Education module for education specifics  Outcome: Progressing   Problem: RH BOWEL ELIMINATION Goal: RH STG MANAGE BOWEL WITH ASSISTANCE Description: STG Manage Bowel with mod I Assistance. Outcome: Progressing   Problem: RH BLADDER ELIMINATION Goal: RH STG MANAGE BLADDER WITH ASSISTANCE Description: STG Manage Bladder With MAX Assistance Outcome: Progressing   Problem: RH SKIN INTEGRITY Goal: RH STG MAINTAIN SKIN INTEGRITY WITH ASSISTANCE Description: STG Maintain Skin Integrity With Mod I Assistance. Outcome: Progressing   Problem: RH SAFETY Goal: RH STG ADHERE TO SAFETY PRECAUTIONS W/ASSISTANCE/DEVICE Description: STG Adhere to Safety Precautions With cues and reminders  Outcome: Progressing   Problem: RH PAIN MANAGEMENT Goal: RH STG PAIN MANAGED AT OR BELOW PT'S PAIN GOAL Description: Pain level less that 4 on scale of 0-10 Outcome: Progressing   Problem: RH KNOWLEDGE DEFICIT Goal: RH STG INCREASE KNOWLEDGE OF HYPERTENSION Description: Pt will be able to adhere to medication regimen, dietary and lifestyle modification to control blood glucose and hyperlipidemia with mod I assist upon discharge.  Outcome: Progressing Goal: RH STG INCREASE KNOWLEGDE OF HYPERLIPIDEMIA Description: Pt will be able to adhere to medication regimen, dietary and lifestyle modification to control blood glucose and hyperlipidemia with mod I assist upon discharge.  Outcome: Progressing Goal: RH STG INCREASE KNOWLEDGE OF STROKE PROPHYLAXIS Description: Pt will be able to adhere to medication regimen, dietary and lifestyle modification to control blood glucose and hyperlipidemia with mod I assist upon discharge.  Outcome: Progressing

## 2019-04-23 NOTE — Progress Notes (Signed)
[place] PHYSICAL MEDICINE & REHABILITATION PROGRESS NOTE   Subjective/Complaints:  Family training performed yesterday with OT and SLP   ROS: Denies CP, SOB, N/V/D  Objective:   No results found. Recent Labs    04/21/19 1220  WBC 4.8  HGB 8.7*  HCT 27.7*  PLT 346   Recent Labs    04/21/19 0550  NA 137  K 3.8  CL 104  CO2 24  GLUCOSE 101*  BUN 12  CREATININE 1.08  CALCIUM 8.6*    Intake/Output Summary (Last 24 hours) at 04/23/2019 0834 Last data filed at 04/23/2019 0524 Gross per 24 hour  Intake 650 ml  Output 300 ml  Net 350 ml     Physical Exam: Vital Signs Blood pressure (!) 143/72, pulse 65, temperature 98.9 F (37.2 C), temperature source Oral, resp. rate 20, height 6' 5"  (1.956 m), weight 100.5 kg, SpO2 100 %.  Constitutional: No distress . Vital signs reviewed. HENT: Normocephalic.  Atraumatic. Eyes: EOMI. No discharge. Cardiovascular: No JVD.RRR no murmur  Respiratory: Normal effort.  No stridor.No rales or rhonchi  GI: Non-distended.+ BS soft , NT  Skin: Warm and dry.  Intact. Psych: Normal mood.  Normal behavior. Musc: No edema in extremities.  No tenderness in extremities. Neurologic: Alert Motor: Bilateral upper extremities: 5/5 proximal distal Bilateral lower extremities: 4+/5 proximal to distal, unchanged Assessment/Plan: 1. Functional deficits secondary to L ACA aneurysm, MCA  Stenosis causing Left hemispheric ischemia which require 3+ hours per day of interdisciplinary therapy in a comprehensive inpatient rehab setting.  Physiatrist is providing close team supervision and 24 hour management of active medical problems listed below.  Physiatrist and rehab team continue to assess barriers to discharge/monitor patient progress toward functional and medical goals  Care Tool:  Bathing    Body parts bathed by patient: Right arm, Left arm, Chest, Abdomen, Right upper leg, Left upper leg, Face, Front perineal area, Right lower leg, Left  lower leg   Body parts bathed by helper: Buttocks Body parts n/a: Left lower leg, Right lower leg   Bathing assist Assist Level: Minimal Assistance - Patient > 75%     Upper Body Dressing/Undressing Upper body dressing   What is the patient wearing?: Pull over shirt    Upper body assist Assist Level: Supervision/Verbal cueing    Lower Body Dressing/Undressing Lower body dressing      What is the patient wearing?: Pants, Incontinence brief     Lower body assist Assist for lower body dressing: Moderate Assistance - Patient 50 - 74%     Toileting Toileting    Toileting assist Assist for toileting: Maximal Assistance - Patient 25 - 49%     Transfers Chair/bed transfer  Transfers assist     Chair/bed transfer assist level: Contact Guard/Touching assist     Locomotion Ambulation   Ambulation assist   Ambulation activity did not occur: Safety/medical concerns  Assist level: Minimal Assistance - Patient > 75% Assistive device: Walker-rolling Max distance: 150'   Walk 10 feet activity   Assist  Walk 10 feet activity did not occur: Safety/medical concerns  Assist level: Contact Guard/Touching assist Assistive device: Walker-rolling   Walk 50 feet activity   Assist Walk 50 feet with 2 turns activity did not occur: Safety/medical concerns  Assist level: Contact Guard/Touching assist Assistive device: Walker-rolling    Walk 150 feet activity   Assist Walk 150 feet activity did not occur: Safety/medical concerns  Assist level: Minimal Assistance - Patient > 75% Assistive device: Walker-rolling  Walk 10 feet on uneven surface  activity   Assist Walk 10 feet on uneven surfaces activity did not occur: Safety/medical concerns         Wheelchair     Assist Will patient use wheelchair at discharge?: (TBD)             Wheelchair 50 feet with 2 turns activity    Assist            Wheelchair 150 feet activity     Assist           Blood pressure (!) 143/72, pulse 65, temperature 98.9 F (37.2 C), temperature source Oral, resp. rate 20, height 6' 5"  (1.956 m), weight 100.5 kg, SpO2 100 %.    Medical Problem List and Plan:  1. 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 Formulized individual treatment plan based on medical history, underlying problem and comorbidities. 2. Antithrombotics:  -DVT/anticoagulation: Eliquis  -antiplatelet therapy: Brilinta  3. Pain Management: Tramadol as needed   Controlled on 2/21 4. Mood: Provide emotional support  -antipsychotic agents: N/A  5. Neuropsych: This patient is capable of making decisions on his own behalf.  6. Skin/Wound Care: Routine skin checks  7. Fluids/Electrolytes/Nutrition: Routine in and outs 8.  Elevated blood pressure readings. Patient on no home med antihypertensive medications. Monitor with increased mobility  Vitals:   04/22/19 1917 04/23/19 0525  BP: (!) 159/90 (!) 143/72  Pulse: 82 65  Resp: 18 20  Temp: 98.9 F (37.2 C) 98.9 F (37.2 C)  SpO2: 99% 100%   BPelevated in [place] this am , ortho vitals ordered but not completed  9. Hyperlipidemia. Lipitor  10. Tobacco abuse. Counseling  11.  Diabetes mellitus type 2. Hemoglobin A1c 6.4. SSI. Patient on no diabetic agents prior to admission. Well controlled no meds CBG (last 3)  Recent Labs    04/22/19 0606 04/22/19 1742 04/23/19 0610  GLUCAP 91 86 103*    controlled on 2/24 12. BPH. Flomax 0.4 mg daily  d/ced again due to orthostasis . Urinary retention with painful caths accompanied  by hematuria , foley replaced, f/u with urology as outpatient 13. Hypothyroidism. TSH 45.  Synthroid started. Patient will need follow-up thyroid panel in 3 months 14.  Severe PAD with R foot ischemia  15.  Onychogryphosis/mycosis-severe OP DPM f/u as they are not availabe for consults in hospital   16. Left post heel pain, ? achiiles tendinopathy, prevalon boot and diclofenac gel    ?  Improving 17.  Transaminitis  LFTs elevated on 2/5, labs ordered for tomorrow 18.  Hypoalbuminemia  Supplement initiated on 2/20 19.  Acute blood loss anemia  Slowly improving 20.  Post stroke dysphagia  D3 thins, advance as tolerated  LOS: 20 days A FACE TO FACE EVALUATION WAS PERFORMED  Charlett Blake 04/23/2019, 8:34 AM

## 2019-04-23 NOTE — Progress Notes (Signed)
Occupational Therapy Discharge Summary  Patient Details  Name: Jeremy Sherman MRN: [id number] Date of Birth: 1950-04-05  Today's Date: 04/23/2019 OT Individual Time:  0905-0950 45 mins     Session Note:  Pt completed toileting, bathing, and dressing sit to stand during session.  He was able to transfer from supine to sit on the EOB with supervision.  Min guard assist for sit to stand from the bed as well as the 3:1 and the tub bench.  He needed min assist for clothing management and toilet hygiene with mod assist for removal of LB clothing in preparation for shower.  Min guard assist for transfer to the tub bench with overall min assist to complete bathing sit to stand.  Had him transfer out to the wheelchair for dressing with min guard assist level using the RW.  He was able to donn UB clothing with supervision.  Therapist assisted with wrapping the LEs as well as donning pants and gripper socks secondary to decreased time.  Pt left in the wheelchair with call button and phone in reach with safety belt in place.    Patient has met 10 of 12 long term goals due to improved activity tolerance, improved balance, postural control, ability to compensate for deficits and functional use of  RIGHT upper extremity.  Patient to discharge at [facility] Assist level.  Patient's care partner is independent to provide the necessary physical and cognitive assistance at discharge.    Reasons goals not met: Pt needs supervision for grooming tasks as well as min assist for LB bathing sit to stand.     Recommendation:  Patient will benefit from ongoing skilled OT services in home health setting to continue to advance functional skills in the area of BADL and Reduce care partner burden.  Pt continues to demonstrate cognitive deficits with regard to memory and orientation.  He needs min instructional cueing for hand and foot placement with sit to stand, but when done correctly he can complete this during LB  selfcare, toileting, and transfers at min guard assist or better.  Still with decreased ability to complete LB selfcare as well.  Feel he will benefit from continued HHOT to further progress ADL independence as well as increase overall endurance.    Equipment: tub bench, 3:1  Reasons for discharge: treatment goals met and discharge from hospital  Patient/family agrees with progress made and goals achieved: Yes  OT Discharge Precautions/Restrictions   Falls  Pain  2/10 in bilateral feet when therapist was assisting with donning gripper socks  ADL ADL Eating: Set up Where Assessed-Eating: Wheelchair Grooming: Supervision/safety Where Assessed-Grooming: Wheelchair Upper Body Bathing: Supervision/safety Where Assessed-Upper Body Bathing: Shower Lower Body Bathing: Minimal assistance Where Assessed-Lower Body Bathing: Shower Upper Body Dressing: Supervision/safety Where Assessed-Upper Body Dressing: Wheelchair Lower Body Dressing: Moderate assistance Where Assessed-Lower Body Dressing: Wheelchair Toileting: Contact guard Where Assessed-Toileting: Bedside Commode Toilet Transfer: Therapist, music Method: Counselling psychologist: Radiographer, therapeutic: Minimal Museum/gallery conservator Method: Optometrist: Facilities manager: Curator Method: Heritage manager: Gaffer Baseline Vision/History: Wears glasses Wears Glasses: Reading only Patient Visual Report: No change from baseline Eye Alignment: Within Functional Limits Ocular Range of Motion: Within Functional Limits Alignment/Gaze Preference: Within Defined Limits Perception  Perception: Within Functional Limits Praxis Praxis: Intact Cognition Overall Cognitive Status: Impaired/Different from baseline Arousal/Alertness: Awake/alert Orientation Level: Oriented  X4 Attention: Selective Focused Attention: Appears intact Sustained  Attention: Appears intact Selective Attention: Impaired Selective Attention Impairment: Functional basic;Verbal basic Memory: Impaired Memory Impairment: Decreased recall of new information;Storage deficit Awareness Impairment: Emergent impairment Problem Solving: Impaired Safety/Judgment: Impaired Comments: Pt with decreased awareness of hand placement with sit to stand, requiring min instructional cueing. Sensation Sensation Light Touch: Appears Intact Hot/Cold: Appears Intact Proprioception: Appears Intact Stereognosis: Appears Intact Additional Comments: Sensation intact in BUEs Coordination Gross Motor Movements are Fluid and Coordinated: No Fine Motor Movements are Fluid and Coordinated: No Coordination and Movement Description: Pt with slower finger to nose movement in the RUE compared to the left, but does not affect functional use with tasks such as self feeding or grooming. Motor  Motor Motor: Abnormal postural alignment and control Motor - Discharge Observations: generalized weakness Mobility  Bed Mobility Bed Mobility: Supine to Sit Supine to Sit: Supervision/Verbal cueing Sit to Supine: Supervision/Verbal cueing Transfers Sit to Stand: Contact Guard/Touching assist Stand to Sit: Contact Guard/Touching assist  Trunk/Postural Assessment  Cervical Assessment Cervical Assessment: Within Functional Limits Thoracic Assessment Thoracic Assessment: (thoracic rounding) Lumbar Assessment Lumbar Assessment: Exceptions to WFL(lumbar flexion in standing)  Balance Balance Balance Assessed: Yes Static Sitting Balance Static Sitting - Balance Support: Feet supported;Bilateral upper extremity supported Static Sitting - Level of Assistance: 5: Stand by assistance Dynamic Sitting Balance Dynamic Sitting - Balance Support: During functional activity Dynamic Sitting - Level of Assistance: 5: Stand by  assistance Static Standing Balance Static Standing - Balance Support: During functional activity Static Standing - Level of Assistance: 5: Stand by assistance Dynamic Standing Balance Dynamic Standing - Balance Support: During functional activity Dynamic Standing - Level of Assistance: Other (comment)(contact guard) Extremity/Trunk Assessment RUE Assessment RUE Assessment: Not tested(Simultaneous filing. User may not have seen previous data.) Active Range of Motion (AROM) Comments: WFLs General Strength Comments: strength WFLs, slight dysmetria with finger to nose testing.(Simultaneous filing. User may not have seen previous data.) LUE Assessment LUE Assessment: Within Functional Limits Active Range of Motion (AROM) Comments: WFL General Strength Comments: strength WFLS for functional tasks and ADLs, but not formally tested   Bern Fare OTR/L 04/23/2019, 4:41 PM

## 2019-04-23 NOTE — Patient Care Conference (Signed)
Inpatient RehabilitationTeam Conference and Plan of Care Update Date: 04/23/2019   Time: 10:30 AM   Patient Name: Jeremy Sherman      Medical Record Number: [id number]  Date of Birth: [birth year] Sex: Male         Room/Bed: 4W17C/4W17C-01 Payor Info: Payor: MEDICARE / Plan: MEDICARE PART A / Product Type: *No Product type* /    Admit Date/Time:  04/03/2019  2:28 PM  Primary Diagnosis:  Stenosis of right cerebellar artery  Patient Active Problem List   Diagnosis Date Noted  . Orthostatic hypotension   . Chronic diastolic congestive heart failure ([place])   . Hypoalbuminemia due to protein-calorie malnutrition ([place])   . Acute blood loss anemia   . Transaminitis   . Diabetes mellitus type 2 in obese ([place])   . Benign prostatic hyperplasia with urinary retention   . Hypothyroidism   . 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 AKI (acute kidney injury) ([place])   . Aneurysm of anterior cerebral artery   . Essential hypertension   . Prediabetes   . Dysphagia, post-stroke   . Thrombus of left atrial appendage   . 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 Carotid stenosis 03/21/2019  . CAD (coronary artery disease) 03/21/2019  . Brain aneurysm 03/21/2019  . Stroke [facility]) 03/20/2019    Expected Discharge Date: Expected Discharge Date: 04/24/19  Team Members Present: Physician leading conference: Dr. Alysia Penna Nurse Present: Dorien Chihuahua, RN;Other (comment)(Olivia Cairo, RN) Case Manager: Karene Fry, RN PT Present: Michaelene Song, PT OT Present: Clyda Greener, OT SLP Present: Jettie Booze, CF-SLP PPS Coordinator present : Gunnar Fusi, SLP     Current Status/Progress Goal Weekly Team Focus  Bowel/Bladder   Coude foley, LBM: 2/23  decrease constipation and urinate on its own once coude catheter remove  assist with toileting needs and provide foley care to prevent infection   Swallow/Nutrition/ Hydration   Dys 3, thin liquids, intermittent  supervision  Supervision A least restrictive diet  tolerance Dys 3, thin liquids, reinforce swallow strategies, sustained attention to intake, family edu   ADL's   Supervision for UB selfcare with min assist for LB bathing, mod assist for LB dressing.  Min assist for toilet transfers with mod assist for shower tub transfers.  Still with decreased orientation as well as some hypotension.  downgraded to contact guard assist  selfcare retraining, balance retraining, transfer training, DME education, therapeutic activites   Mobility   CGA-min A bed mobility, CGA sit<>stands w/ RW (elevate surface), gait up to 150 ft with RW CGA, 4 step with B rails and CGA-minA  supervision-CGA  transfers, gait, dynamic standing balance, activity tolerance   Communication             Safety/Cognition/ Behavioral Observations  Min a basic problem solving, Supervision A orientation with aids and selective attention, Min-Mod awareness and recall  Supervision-Min A  error awareness, recall with aids and strategies, problem solving, family education   Pain   patient denies pain  remain pain free  assess pain q shift and PRN   Skin   surgical incision in L neck and groin healed, BLE still dry, flaky and feet are craking  remain free of new skin breakdown and infection  skin assessment q shft and PRN    Rehab Goals Patient on target to meet rehab goals: Yes Rehab Goals Revised: Goals downgraded due to progression and orthostatic hypotension; wife willing to provide needed care *See Care Plan and progress notes  for long and short-term goals.     Barriers to Discharge  Current Status/Progress Possible Resolutions Date Resolved   Nursing                  PT                    OT                  SLP                SW Home environment access/layout(son working on Interior and spatial designer on porch;currently has wrought iron posts)   Daughter from [place] in/out of home can provide intermittent assist; plan for  wife to assist patient as needed at discharge          Discharge Planning/Teaching Needs:  Home with wife who can provide 24/7 assistance  Medications, transfers, secondary stroke prevention, ambulation, etc   Team Discussion: Flomax DC, home with foley, hematuria, on blood thinners, BP stablized, orthostatic this am, wife in yesterday.  RN ace wraps to feet, foley care done.  OT S UB B, min A LB B, mod A LB D, transfers min, toilet hygiene min A, disoriented, fam ed done.  PT S transfers up to [place], amb CGA, wife had fam ed.  SLP overall min A cognition, on D3thins.   Revisions to Treatment Plan: N/A     Medical Summary Current Status: Rmains confused, had problems with orthostatic drops on flomax, needs foley for retention, intermittent hematuria but Hgb is stable Weekly Focus/Goal: stopped Flomax, monitor BPs  Barriers to Discharge: Medical stability;Other (comments)  Barriers to Discharge Comments: orthostatic drops Possible Resolutions to Barriers: D/C planning   Continued Need for Acute Rehabilitation Level of Care: The patient requires daily medical management by a physician with specialized training in physical medicine and rehabilitation for the following reasons: Direction of a multidisciplinary physical rehabilitation program to maximize functional independence : Yes Medical management of patient stability for increased activity during participation in an intensive rehabilitation regime.: Yes Analysis of laboratory values and/or radiology reports with any subsequent need for medication adjustment and/or medical intervention. : Yes   I attest that I was present, lead the team conference, and concur with the assessment and plan of the team.   Jodell Cipro M 04/23/2019, 1:53 PM   Team conference was held via web/ teleconference due to [place] - 19

## 2019-04-24 ENCOUNTER — Telehealth (HOSPITAL_COMMUNITY): Payer: Self-pay

## 2019-04-24 DIAGNOSIS — R4189 Other symptoms and signs involving cognitive functions and awareness: Secondary | ICD-10-CM

## 2019-04-24 DIAGNOSIS — I69398 Other sequelae of cerebral infarction: Secondary | ICD-10-CM

## 2019-04-24 DIAGNOSIS — I639 Cerebral infarction, unspecified: Secondary | ICD-10-CM

## 2019-04-24 DIAGNOSIS — R269 Unspecified abnormalities of gait and mobility: Secondary | ICD-10-CM

## 2019-04-24 LAB — GLUCOSE, CAPILLARY: Glucose-Capillary: 97 mg/dL (ref 70–99)

## 2019-04-24 NOTE — Progress Notes (Signed)
[place] PHYSICAL MEDICINE & REHABILITATION PROGRESS NOTE   Subjective/Complaints:  Aware of discharge day today.  No pain c/os   ROS: Denies CP, SOB, N/V/D  Objective:   No results found. Recent Labs    04/21/19 1220  WBC 4.8  HGB 8.7*  HCT 27.7*  PLT 346   No results for input(s): NA, K, CL, CO2, GLUCOSE, BUN, CREATININE, CALCIUM in the last 72 hours.  Intake/Output Summary (Last 24 hours) at 04/24/2019 0740 Last data filed at 04/24/2019 0400 Gross per 24 hour  Intake 540 ml  Output 600 ml  Net -60 ml     Physical Exam: Vital Signs Blood pressure (!) 158/74, pulse 69, temperature 98.6 F (37 C), temperature source Oral, resp. rate 20, height 6\' 5"  (1.956 m), weight 100.5 kg, SpO2 98 %.  Constitutional: No distress . Vital signs reviewed. HENT: Normocephalic.  Atraumatic. Eyes: EOMI. No discharge. Cardiovascular: No JVD.RRR no murmur  Respiratory: Normal effort.  No stridor.No rales or rhonchi  GI: Non-distended.+ BS soft , NT  Skin: Warm and dry.  Intact. Psych: Normal mood.  Normal behavior. Musc: No edema in extremities.  No tenderness in extremities. Neurologic: Alert Motor: Bilateral upper extremities: 5/5 proximal distal Bilateral lower extremities: 4+/5 proximal to distal, unchanged Assessment/Plan: 1. Functional deficits secondary to L ACA aneurysm, MCA  Stenosis causing Left hemispheric ischemia Stable for D/C today F/u PCP in 3-4 weeks F/u PM&R 2 weeks See D/C summary See D/C instructions Care Tool:  Bathing    Body parts bathed by patient: Right arm, Left arm, Chest, Abdomen, Right upper leg, Left upper leg, Face, Front perineal area, Right lower leg, Left lower leg   Body parts bathed by helper: Buttocks Body parts n/a: Left lower leg, Right lower leg   Bathing assist Assist Level: Minimal Assistance - Patient > 75%     Upper Body Dressing/Undressing Upper body dressing   What is the patient wearing?: Pull over shirt    Upper body  assist Assist Level: Set up assist    Lower Body Dressing/Undressing Lower body dressing      What is the patient wearing?: Pants, Incontinence brief     Lower body assist Assist for lower body dressing: Minimal Assistance - Patient > 75%     Toileting Toileting    Toileting assist Assist for toileting: Minimal Assistance - Patient > 75%     Transfers Chair/bed transfer  Transfers assist     Chair/bed transfer assist level: Contact Guard/Touching assist     Locomotion Ambulation   Ambulation assist   Ambulation activity did not occur: Safety/medical concerns  Assist level: Contact Guard/Touching assist Assistive device: Walker-rolling Max distance: 50'   Walk 10 feet activity   Assist  Walk 10 feet activity did not occur: Safety/medical concerns  Assist level: Contact Guard/Touching assist Assistive device: Walker-rolling   Walk 50 feet activity   Assist Walk 50 feet with 2 turns activity did not occur: Safety/medical concerns  Assist level: Contact Guard/Touching assist Assistive device: Walker-rolling    Walk 150 feet activity   Assist Walk 150 feet activity did not occur: Safety/medical concerns  Assist level: Minimal Assistance - Patient > 75% Assistive device: Walker-rolling    Walk 10 feet on uneven surface  activity   Assist Walk 10 feet on uneven surfaces activity did not occur: Safety/medical concerns   Assist level: Minimal Assistance - Patient > 75% Assistive device: Aeronautical engineer Will patient use wheelchair  at discharge?: (TBD)             Wheelchair 50 feet with 2 turns activity    Assist            Wheelchair 150 feet activity     Assist          Blood pressure (!) 158/74, pulse 69, temperature 98.6 F (37 C), temperature source Oral, resp. rate 20, height 6\' 5"  (1.956 m), weight 100.5 kg, SpO2 98 %.    Medical Problem List and Plan:  1. 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 Antithrombotics:  -DVT/anticoagulation: Eliquis  -antiplatelet therapy: Brilinta  3. Pain Management: Tramadol as needed   Controlled on 2/21 4. Mood: Provide emotional support  -antipsychotic agents: N/A  5. Neuropsych: This patient is capable of making decisions on his own behalf.  6. Skin/Wound Care: Routine skin checks  7. Fluids/Electrolytes/Nutrition: Routine in and outs 8.  Elevated blood pressure readings. Patient on no home med antihypertensive medications. Monitor with increased mobility  Vitals:   04/23/19 1917 04/24/19 0357  BP: (!) 135/58 (!) 158/74  Pulse: 75 69  Resp: 18 20  Temp: 99.1 F (37.3 C) 98.6 F (37 C)  SpO2: 100% 98%   BPelevated in [place] this am , ortho vitals ordered but not completed  9. Hyperlipidemia. Lipitor  10. Tobacco abuse. Counseling  11.  Diabetes mellitus type 2. Hemoglobin A1c 6.4. SSI. Patient on no diabetic agents prior to admission. Well controlled no meds CBG (last 3)  Recent Labs    04/23/19 0610 04/23/19 1718 04/24/19 0559  GLUCAP 103* 88 97    controlled on 2/24 12. BPH. Flomax 0.4 mg daily  d/ced again due to orthostasis . Urinary retention with painful caths accompanied by hematuria , foley replaced, f/u with urology as outpatient 13. Hypothyroidism. TSH 45.  Synthroid started. Patient will need follow-up thyroid panel in 3 months 14.  Severe PAD with R foot ischemia  15.  Onychogryphosis/mycosis-severe OP DPM f/u as they are not availabe for consults in hospital   16. Left post heel pain, ? achiiles tendinopathy, prevalon boot and diclofenac gel    ?  Improving 17.  Transaminitis  LFTs elevated on 2/5,  labs ordered for tomorrow 18.  Hypoalbuminemia  Supplement initiated on 2/20 19.  Acute blood loss anemia  Slowly improving 20.  Post stroke dysphagia  D3 thins, advance as tolerated  LOS: 21 days A FACE TO FACE EVALUATION WAS PERFORMED  Charlett Blake 04/24/2019, 7:40 AM

## 2019-04-24 NOTE — Telephone Encounter (Signed)
Called to schedule f/u, no answer, left vm. AW 

## 2019-04-24 NOTE — Plan of Care (Signed)
  Problem: Consults Goal: RH STROKE PATIENT EDUCATION Description: See Patient Education module for education specifics  04/24/2019 1101 by Amanda Cockayne, LPN Outcome: Completed/Met 04/24/2019 1101 by Amanda Cockayne, LPN Outcome: Progressing   Problem: RH BOWEL ELIMINATION Goal: RH STG MANAGE BOWEL WITH ASSISTANCE Description: STG Manage Bowel with mod I Assistance. 04/24/2019 1101 by Toy Cookey F, LPN Outcome: Completed/Met 04/24/2019 1101 by Amanda Cockayne, LPN Outcome: Progressing   Problem: RH BLADDER ELIMINATION Goal: RH STG MANAGE BLADDER WITH ASSISTANCE Description: STG Manage Bladder With MAX Assistance 04/24/2019 1101 by Amanda Cockayne, LPN Outcome: Completed/Met 04/24/2019 1101 by Amanda Cockayne, LPN Outcome: Progressing   Problem: RH SKIN INTEGRITY Goal: RH STG MAINTAIN SKIN INTEGRITY WITH ASSISTANCE Description: STG Maintain Skin Integrity With Mod I Assistance. 04/24/2019 1101 by Toy Cookey F, LPN Outcome: Completed/Met 04/24/2019 1101 by Amanda Cockayne, LPN Outcome: Progressing   Problem: RH SAFETY Goal: RH STG ADHERE TO SAFETY PRECAUTIONS W/ASSISTANCE/DEVICE Description: STG Adhere to Safety Precautions With cues and reminders  04/24/2019 1101 by Toy Cookey F, LPN Outcome: Completed/Met 04/24/2019 1101 by Amanda Cockayne, LPN Outcome: Progressing   Problem: RH PAIN MANAGEMENT Goal: RH STG PAIN MANAGED AT OR BELOW PT'S PAIN GOAL Description: Pain level less that 4 on scale of 0-10 04/24/2019 1101 by Toy Cookey F, LPN Outcome: Completed/Met 04/24/2019 1101 by Amanda Cockayne, LPN Outcome: Progressing   Problem: RH KNOWLEDGE DEFICIT Goal: RH STG INCREASE KNOWLEDGE OF HYPERTENSION Description: Pt will be able to adhere to medication regimen, dietary and lifestyle modification to control blood glucose and hyperlipidemia with mod I assist upon discharge.  04/24/2019 1101 by Toy Cookey F, LPN Outcome:  Completed/Met 04/24/2019 1101 by Amanda Cockayne, LPN Outcome: Progressing Goal: RH STG INCREASE KNOWLEGDE OF HYPERLIPIDEMIA Description: Pt will be able to adhere to medication regimen, dietary and lifestyle modification to control blood glucose and hyperlipidemia with mod I assist upon discharge.  04/24/2019 1101 by Toy Cookey F, LPN Outcome: Completed/Met 04/24/2019 1101 by Amanda Cockayne, LPN Outcome: Progressing Goal: RH STG INCREASE KNOWLEDGE OF STROKE PROPHYLAXIS Description: Pt will be able to adhere to medication regimen, dietary and lifestyle modification to control blood glucose and hyperlipidemia with mod I assist upon discharge.  04/24/2019 1101 by Toy Cookey F, LPN Outcome: Completed/Met 04/24/2019 1101 by Amanda Cockayne, LPN Outcome: Progressing

## 2019-04-24 NOTE — Plan of Care (Signed)
  Problem: Consults Goal: RH STROKE PATIENT EDUCATION Description: See Patient Education module for education specifics  Outcome: Progressing   Problem: RH BOWEL ELIMINATION Goal: RH STG MANAGE BOWEL WITH ASSISTANCE Description: STG Manage Bowel with mod I Assistance. Outcome: Progressing   Problem: RH BLADDER ELIMINATION Goal: RH STG MANAGE BLADDER WITH ASSISTANCE Description: STG Manage Bladder With MAX Assistance Outcome: Progressing   Problem: RH SKIN INTEGRITY Goal: RH STG MAINTAIN SKIN INTEGRITY WITH ASSISTANCE Description: STG Maintain Skin Integrity With Mod I Assistance. Outcome: Progressing   Problem: RH SAFETY Goal: RH STG ADHERE TO SAFETY PRECAUTIONS W/ASSISTANCE/DEVICE Description: STG Adhere to Safety Precautions With cues and reminders  Outcome: Progressing   Problem: RH PAIN MANAGEMENT Goal: RH STG PAIN MANAGED AT OR BELOW PT'S PAIN GOAL Description: Pain level less that 4 on scale of 0-10 Outcome: Progressing   Problem: RH KNOWLEDGE DEFICIT Goal: RH STG INCREASE KNOWLEDGE OF HYPERTENSION Description: Pt will be able to adhere to medication regimen, dietary and lifestyle modification to control blood glucose and hyperlipidemia with mod I assist upon discharge.  Outcome: Progressing Goal: RH STG INCREASE KNOWLEGDE OF HYPERLIPIDEMIA Description: Pt will be able to adhere to medication regimen, dietary and lifestyle modification to control blood glucose and hyperlipidemia with mod I assist upon discharge.  Outcome: Progressing Goal: RH STG INCREASE KNOWLEDGE OF STROKE PROPHYLAXIS Description: Pt will be able to adhere to medication regimen, dietary and lifestyle modification to control blood glucose and hyperlipidemia with mod I assist upon discharge.  Outcome: Progressing

## 2019-04-24 NOTE — Progress Notes (Signed)
Team Conference Report to Patient/Family  Team Conference discussion was reviewed with the patient and caregiver, including goals, any changes in plan of care and target discharge date.  Patient and caregiver express understanding and are in agreement.  The patient has a target discharge date of 04/24/19.  Jeremy Sherman B 04/24/2019, 9:10 AM

## 2019-04-24 NOTE — Care Management (Signed)
   The overall goal for the admission was met for:   Discharge location: Home with wife  Length of Stay: 21 days with discharge 04/24/19  Discharge activity level: Supervision - Minimal assistance  Home/community participation: Limited participation  Services provided included: MD, RD, PT, OT, SLP, RN, CM, Pharmacy, Neuropsych and SW  Financial Services: Medicare  Follow-up services arranged: Home Health: RN, ST, PT, OT from [facility], DME: Wheelchair, RW, 3n1, TTB and Patient/Family has no preference for HH/DME agencies  Comments (or additional information): [facility] [phone] RN added for maintenance of the foley catheter until follow up with the urologist.  Patient/Family verbalized understanding of follow-up arrangements: Yes  Individual responsible for coordination of the follow-up plan: Wife Nasif Bos [phone]   Confirmed correct DME delivered: Margarito Liner 04/24/2019    Margarito Liner

## 2019-04-24 NOTE — Progress Notes (Signed)
Patient was given D/C instructions by [name], [place]. All equipment and belongings have been packed with patient. Foley care teaching and instructions were given to family member. No concerns to report at this time. Amanda Cockayne, LPN

## 2019-04-25 ENCOUNTER — Telehealth: Payer: Self-pay | Admitting: Registered Nurse

## 2019-04-25 NOTE — Telephone Encounter (Signed)
Placed a call to Jeremy Sherman, no answer. Left message to return the call.

## 2019-04-25 NOTE — Telephone Encounter (Signed)
Transitional Care call Transitional Questions Answered by Jeremy Sherman  Patient name: Jeremy Sherman. Senat  DOB: 08/08/1950 1. Are you/is patient experiencing any problems since coming home? No a. Are there any questions regarding any aspect of care? No 2. Are there any questions regarding medications administration/dosing? No a. Are meds being taken as prescribed? All except Brilinta, [name] was sent a message to submit PA.  b. "Patient should review meds with caller to confirm"  Medication List Reviewed. 3. Have there been any falls? No 4. Has Home Health been to the house and/or have they contacted you? No, [facility], This provider placed a call to [facility], no answer. Left message to return the call. [name] is aware of the above.  a. If not, have you tried to contact them? No b. Can we help you contact them? Yes 5. Are bowels and bladder emptying properly? He has a foley catheter and is incontinent of stool.  a. Are there any unexpected incontinence issues? See Above  b. If applicable, is patient following bowel/bladder programs?  6. Any fevers, problems with breathing, unexpected pain? No 7. Are there any skin problems or new areas of breakdown? No 8. Has the patient/family member arranged specialty MD follow up (ie cardiology/neurology/renal/surgical/etc.)?  [name] instructed to call to scheduled HFU appointments, she verbalizes understanding.  a. Can we help arrange? No 9. Does the patient need any other services or support that we can help arrange? No 10. Are caregivers following through as expected in assisting the patient? Yes 11. Has the patient quit smoking, drinking alcohol, or using drugs as recommended? (                        )  Appointment date/time 05/02/2019  arrival time 12:40 for 1:00 appointment with Dr Ranell Patrick. At [place]

## 2019-04-28 DIAGNOSIS — N179 Acute kidney failure, unspecified: Secondary | ICD-10-CM

## 2019-04-28 HISTORY — DX: Acute kidney failure, unspecified: N17.9

## 2019-05-02 ENCOUNTER — Encounter
Payer: BC Managed Care – PPO | Attending: Physical Medicine and Rehabilitation | Admitting: Physical Medicine and Rehabilitation

## 2019-05-02 ENCOUNTER — Other Ambulatory Visit: Payer: Self-pay

## 2019-05-02 ENCOUNTER — Encounter: Payer: Self-pay | Admitting: Physical Medicine and Rehabilitation

## 2019-05-02 VITALS — BP 117/73 | HR 79 | Temp 97.8°F | Resp 16 | Ht 77.0 in | Wt 225.0 lb

## 2019-05-02 DIAGNOSIS — I663 Occlusion and stenosis of cerebellar arteries: Secondary | ICD-10-CM | POA: Insufficient documentation

## 2019-05-02 DIAGNOSIS — R4189 Other symptoms and signs involving cognitive functions and awareness: Secondary | ICD-10-CM | POA: Diagnosis not present

## 2019-05-02 DIAGNOSIS — Z7409 Other reduced mobility: Secondary | ICD-10-CM | POA: Insufficient documentation

## 2019-05-02 DIAGNOSIS — Z789 Other specified health status: Secondary | ICD-10-CM | POA: Diagnosis not present

## 2019-05-02 NOTE — Progress Notes (Signed)
Subjective:    Patient ID: Jeremy Sherman, male    DOB: Feb 17, 1951, 69 y.o.   MRN: [id number]  HPI  Jeremy Sherman presents for hospital follow-up after CIR admission for right carotid artery stenosis.   Brief HPI:   Jeremy Sherman is a 69 y.o. right-handed male with history of hypertension, hyperlipidemia, prediabetes, CAD with CABG 2009, tobacco abuse.  Patient lives with spouse and daughter works full-time as a Glass blower/designer.  Patient had episode of dizziness confusion 03/17/2019.  He went to his PCPs office became unresponsive hypotensive and diaphoretic.  Blood pressure reportedly lower than 100 he was brought by EMS to [facility].  Patient had recent been hospitalized at [facility] 03/11/2019 to 03/13/2019 for hypertensive urgency.  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  TEE at outside hospital showed left atrial appendage thrombus was placed on intravenous heparin.  He was transferred to [facility].  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 Oneida Alar 03/26/2019.  Found to have right hemiparesis following procedure.  Follow-up imaging showed early subacute infarction in the right cerebellar peduncle likely small vessel disease.  He did remain intubated through 03/27/2019.  He had been receiving Cleviprex for blood pressure control.  Initially on intravenous heparin transition to Eliquis as well as Brilinta.  Patient with persistent low-grade fever urinalysis 03/29/2019 + nitrite placed on Levaquin x5 doses that has since been completed.  Maintained on dysphagia #1 thin liquid diet.  Hospital course findings of elevated TSH level  45.808 placed on Synthroid.  Therapy evaluations completed and patient was admitted for a comprehensive rehab program   Hospital Course: Jeremy Sherman was admitted to rehab 04/03/2019 for inpatient therapies to consist of PT, ST and OT at least three hours five days a week. Past admission physiatrist, therapy team and rehab RN have worked together to provide customized collaborative inpatient rehab.  Hospital course pertaining to patient's left ACA pericallosal segment aneurysm underwent stenting coiling as well as left ICA proximal stenosis with CEA followed by stent angioplasty 03/26/2019 per interventional radiology postoperative right cerebellar peduncle infarction patient remained on Eliquis as well as Brilinta.  He would follow-up neurology services as well as interventional radiology.  Patient's blood pressure was currently maintained on no antihypertensive medications.  Lipitor for hyperlipidemia.  History of tobacco abuse he received counts regards to cessation of nicotine products.  Diabetes mellitus hemoglobin A1c 6.4 diabetic teaching.  His diet was slowly advanced to mechanical soft.  Patient was noted significant onychogryphosis/mycosis he would need follow-up outpatient with podiatry services.  In regards to patient's hypothyroidism.  Noted elevated TSH levels maintained on Synthroid he would need follow-up thyroid panel as outpatient.  In regards to patient's BPH difficulty in and out catheterizations with some hematuria related to anticoagulation in light of retention a Foley catheter tube was placed outpatient referral obtained for urology services for follow-up and Flomax had been discontinued due to orthostasis.  Jeremy Sherman has received his initial home OT and PT evals He did stand and ambulate with therapy but does not usually when it is just his wife home with him given his size and weakness. He continues to have  cognitive deficits and memory impairments and his wife provides  all history. He enjoys working and operates [employer] says he is the only one who can do what he does but that they will need to find someone else now. His wife asks if I may fill out his short-term disability paperwork and provide documents for his insurance claim. All medications are reviewed and he is taking them as prescribed. He continues to have foot pain for which he takes the Tramadol. His wife is very cautious of his use with this. He has been having some incontinence of bowel and bladder and has diapers.   Pain Inventory Average Pain 0 Pain Right Now 0 My pain is na  In the last 24 hours, has pain interfered with the following? General activity 0 Relation with others 0 Enjoyment of life 0 What TIME of day is your pain at its worst? na Sleep (in general) Good  Pain is worse with: na Pain improves with: na Relief from Meds: 0  Mobility ability to climb steps?  no do you drive?  no use a wheelchair needs help with transfers  Function employed # of hrs/week 40 I need assistance with the following:  dressing, bathing, toileting, meal prep, household duties and shopping  Neuro/Psych bladder control problems bowel control problems trouble walking confusion depression  Prior Studies Any changes since last visit?  no  Physicians involved in your care Any changes since last visit?  no   Family History  Problem Relation Age of Onset   Stroke Mother    Social History   Socioeconomic History   Marital status: Married    Spouse name: Not on file   Number of children: Not on file   Years of education: Not on file   Highest education level: Not on file  Occupational History   Not on file  Tobacco Use   Smoking status: Current Every Day Smoker    Packs/day: 0.50    Years: 45.00    Pack years: 22.50    Types: Cigarettes    Start date: 02/27/1974   Smokeless tobacco: Never Used  Substance and Sexual Activity   Alcohol use: Not Currently   Drug use:  Not on file   Sexual activity: Not on file  Other Topics Concern   Not on file  Social History Narrative   Not on file   Social Determinants of Health   Financial Resource Strain:    Difficulty of Paying Living Expenses: Not on file  Food Insecurity:    Worried About Running Out of Food in the Last Year: Not on file   Ran Out of Food in the Last Year: Not on file  Transportation Needs:    Lack of Transportation (Medical): Not on file   Lack of Transportation (Non-Medical): Not on file  Physical Activity:    Days of Exercise per Week: Not on file   Minutes of Exercise per Session: Not on file  Stress:    Feeling of Stress : Not on file  Social Connections:    Frequency of Communication with Friends and Family: Not on file   Frequency of Social Gatherings with Friends and Family: Not on file   Attends Religious Services: Not on file   Active Member of Clubs or Organizations: Not on file   Attends Archivist Meetings: Not on file   Marital Status: Not on file   Past Surgical History:  Procedure Laterality Date   CORONARY ARTERY BYPASS GRAFT  07/02/2007  [place]-> LAD SVG-> PD branch of [facility] [name]) at [place] Left 03/26/2019   Procedure: EXPOSURE OF LEFT COMMON CAROTID ARTERY AND PLACEMENT OF SHEATH;  Surgeon: Marty Heck, MD;  Location: [facility];  Service: Vascular;  Laterality: Left;   ENDARTERECTOMY Left 03/26/2019   Procedure: REMOVAL OF CAROTID SHEATH AND CLOSURE OF  CAROTID ARTERY;  Surgeon: Serafina Mitchell, MD;  Location: [facility] OR;  Service: Vascular;  Laterality: Left;   IR ANGIO INTRA EXTRACRAN SEL COM CAROTID INNOMINATE BILAT MOD SED  03/21/2019   IR ANGIO INTRA EXTRACRAN SEL INTERNAL CAROTID UNI L MOD SED  03/26/2019   IR ANGIO VERTEBRAL SEL SUBCLAVIAN INNOMINATE BILAT MOD SED  03/21/2019   IR ANGIOGRAM FOLLOW UP STUDY  03/26/2019   IR ANGIOGRAM FOLLOW UP STUDY  03/26/2019   IR ANGIOGRAM  FOLLOW UP STUDY  03/26/2019   IR ANGIOGRAM FOLLOW UP STUDY  03/26/2019   IR ANGIOGRAM FOLLOW UP STUDY  03/26/2019   IR ANGIOGRAM FOLLOW UP STUDY  03/26/2019   IR CT HEAD LTD  03/26/2019   IR INTRA CRAN STENT  03/26/2019   IR INTRAVSC STENT CERV CAROTID W/O EMB-PROT MOD SED INC ANGIO  03/26/2019   IR NEURO EACH ADD'L AFTER BASIC UNI LEFT (MS)  03/26/2019   IR TRANSCATH/EMBOLIZ  03/26/2019   Past Medical History:  Diagnosis Date   CAD (coronary artery disease)    Carotid stenosis, left    L ICA 50%   Hyperlipidemia    Hypertension    Hypertensive urgency 03/11/2019   Hypothyroidism    secondary to RAIA   Prediabetes    BP 117/73    Pulse 79    Temp 97.8 F (36.6 C)    Resp 16    Ht 6\' 5"  (1.956 m)    Wt 225 lb (102.1 kg)    BMI 26.68 kg/m   Opioid Risk Score:   Fall Risk Score:  `1  Depression screen PHQ 2/9  No flowsheet data found.   Review of Systems  Constitutional: Negative.   HENT: Negative.   Eyes: Negative.   Respiratory: Negative.   Cardiovascular: Negative.   Gastrointestinal: Negative.   Endocrine: Negative.   Genitourinary: Positive for difficulty urinating.  Musculoskeletal: Positive for gait problem.  Skin: Negative.   Allergic/Immunologic: Negative.   Hematological: Negative.   Psychiatric/Behavioral: Positive for confusion and dysphoric mood.  All other systems reviewed and are negative.      Objective:   Physical Exam  Constitutional: No distress . Vital signs reviewed. HENT: Normocephalic.  Atraumatic. Eyes: EOMI. No discharge. Cardiovascular: No JVD.RRR no murmur  Respiratory: Normal effort.  No stridor.No rales or rhonchi  GI: Non-distended.+ BS soft , NT  Skin: Warm and dry.  Intact. Psych: Normal mood.  Normal behavior. Musc: No edema in extremities.  No tenderness in extremities. Neurologic: Alert Motor: Bilateral upper extremities: 5/5 proximal distal Bilateral lower extremities: 4+/5 proximal to distal Seated in WC. Left  foot rest of wheelchair is turned outward.       Assessment & Plan:  1. Right side weakness with dysphagia secondary to left ACA pericallosal segment aneurysm S/P stent assisted coiling as well as left ICA proximal stenosis S/P CEA followed by stent angioplasty 03/26/2019 per interventional radiology with post procedure right cerebellar peduncle infarction             Continue Home PT and OT. Discussed the importance of q2H repositioning to prevent pressure ulcer formation  given his limited ambulation at home.   Advised that he perform HEP every day he does not have therapy and that wife assist him in ambulation around the home as much as possible when he does not have therapy.   2. Antithrombotics:  -Continue Brilinta and Eliquis. Does not require refills.  3. Pain Management: Tramadol as needed. Wife is trying to use sparingly, only when he complains of moderate foot pain. Advised that this could lower blood pressure.   4.  Elevated blood pressure readings in CIR: Much better controlled in office today.  5. Impaired mobility and cognition: patient is not safe currently to return to work and operate machinery. I have filled out his short term disability paperwork and provides the records he needs for his insurance claim given the long-lasting effects of his brain injury.   All questions answered. RTC in 1 month.

## 2019-05-07 ENCOUNTER — Encounter: Payer: Self-pay | Admitting: Sports Medicine

## 2019-05-07 ENCOUNTER — Other Ambulatory Visit: Payer: Self-pay

## 2019-05-07 ENCOUNTER — Encounter (HOSPITAL_COMMUNITY): Payer: Self-pay | Admitting: Emergency Medicine

## 2019-05-07 ENCOUNTER — Emergency Department (HOSPITAL_COMMUNITY): Payer: Medicare Other

## 2019-05-07 ENCOUNTER — Inpatient Hospital Stay (HOSPITAL_COMMUNITY)
Admission: EM | Admit: 2019-05-07 | Discharge: 2019-05-26 | DRG: 682 | Disposition: A | Discharge status: Home Health | Payer: Medicare Other | Attending: Internal Medicine | Admitting: Internal Medicine

## 2019-05-07 ENCOUNTER — Ambulatory Visit (INDEPENDENT_AMBULATORY_CARE_PROVIDER_SITE_OTHER): Payer: BC Managed Care – PPO | Admitting: Sports Medicine

## 2019-05-07 DIAGNOSIS — M79675 Pain in left toe(s): Secondary | ICD-10-CM

## 2019-05-07 DIAGNOSIS — C9000 Multiple myeloma not having achieved remission: Secondary | ICD-10-CM | POA: Diagnosis present

## 2019-05-07 DIAGNOSIS — I513 Intracardiac thrombosis, not elsewhere classified: Secondary | ICD-10-CM | POA: Diagnosis present

## 2019-05-07 DIAGNOSIS — B351 Tinea unguium: Secondary | ICD-10-CM | POA: Diagnosis not present

## 2019-05-07 DIAGNOSIS — L602 Onychogryphosis: Secondary | ICD-10-CM

## 2019-05-07 DIAGNOSIS — G9341 Metabolic encephalopathy: Secondary | ICD-10-CM | POA: Diagnosis present

## 2019-05-07 DIAGNOSIS — E872 Acidosis: Secondary | ICD-10-CM | POA: Diagnosis present

## 2019-05-07 DIAGNOSIS — E119 Type 2 diabetes mellitus without complications: Secondary | ICD-10-CM

## 2019-05-07 DIAGNOSIS — E785 Hyperlipidemia, unspecified: Secondary | ICD-10-CM | POA: Diagnosis present

## 2019-05-07 DIAGNOSIS — R768 Other specified abnormal immunological findings in serum: Secondary | ICD-10-CM

## 2019-05-07 DIAGNOSIS — Z8673 Personal history of transient ischemic attack (TIA), and cerebral infarction without residual deficits: Secondary | ICD-10-CM

## 2019-05-07 DIAGNOSIS — E860 Dehydration: Secondary | ICD-10-CM | POA: Diagnosis present

## 2019-05-07 DIAGNOSIS — N179 Acute kidney failure, unspecified: Secondary | ICD-10-CM | POA: Diagnosis not present

## 2019-05-07 DIAGNOSIS — R9431 Abnormal electrocardiogram [ECG] [EKG]: Secondary | ICD-10-CM

## 2019-05-07 DIAGNOSIS — G8929 Other chronic pain: Secondary | ICD-10-CM | POA: Diagnosis present

## 2019-05-07 DIAGNOSIS — Z7901 Long term (current) use of anticoagulants: Secondary | ICD-10-CM

## 2019-05-07 DIAGNOSIS — I959 Hypotension, unspecified: Secondary | ICD-10-CM | POA: Diagnosis present

## 2019-05-07 DIAGNOSIS — L853 Xerosis cutis: Secondary | ICD-10-CM

## 2019-05-07 DIAGNOSIS — R5381 Other malaise: Secondary | ICD-10-CM | POA: Diagnosis present

## 2019-05-07 DIAGNOSIS — Z20822 Contact with and (suspected) exposure to covid-19: Secondary | ICD-10-CM | POA: Diagnosis present

## 2019-05-07 DIAGNOSIS — I119 Hypertensive heart disease without heart failure: Secondary | ICD-10-CM | POA: Diagnosis present

## 2019-05-07 DIAGNOSIS — Z79890 Hormone replacement therapy (postmenopausal): Secondary | ICD-10-CM

## 2019-05-07 DIAGNOSIS — R319 Hematuria, unspecified: Secondary | ICD-10-CM | POA: Diagnosis present

## 2019-05-07 DIAGNOSIS — M79674 Pain in right toe(s): Secondary | ICD-10-CM

## 2019-05-07 DIAGNOSIS — Z79899 Other long term (current) drug therapy: Secondary | ICD-10-CM

## 2019-05-07 DIAGNOSIS — Z951 Presence of aortocoronary bypass graft: Secondary | ICD-10-CM

## 2019-05-07 DIAGNOSIS — I10 Essential (primary) hypertension: Secondary | ICD-10-CM | POA: Diagnosis present

## 2019-05-07 DIAGNOSIS — Z23 Encounter for immunization: Secondary | ICD-10-CM

## 2019-05-07 DIAGNOSIS — N401 Enlarged prostate with lower urinary tract symptoms: Secondary | ICD-10-CM | POA: Diagnosis present

## 2019-05-07 DIAGNOSIS — D638 Anemia in other chronic diseases classified elsewhere: Secondary | ICD-10-CM | POA: Diagnosis present

## 2019-05-07 DIAGNOSIS — Z823 Family history of stroke: Secondary | ICD-10-CM

## 2019-05-07 DIAGNOSIS — D509 Iron deficiency anemia, unspecified: Secondary | ICD-10-CM | POA: Diagnosis present

## 2019-05-07 DIAGNOSIS — Z791 Long term (current) use of non-steroidal anti-inflammatories (NSAID): Secondary | ICD-10-CM

## 2019-05-07 DIAGNOSIS — E669 Obesity, unspecified: Secondary | ICD-10-CM

## 2019-05-07 DIAGNOSIS — N390 Urinary tract infection, site not specified: Secondary | ICD-10-CM

## 2019-05-07 DIAGNOSIS — N19 Unspecified kidney failure: Secondary | ICD-10-CM

## 2019-05-07 DIAGNOSIS — E039 Hypothyroidism, unspecified: Secondary | ICD-10-CM | POA: Diagnosis present

## 2019-05-07 DIAGNOSIS — T83511A Infection and inflammatory reaction due to indwelling urethral catheter, initial encounter: Secondary | ICD-10-CM

## 2019-05-07 DIAGNOSIS — I2510 Atherosclerotic heart disease of native coronary artery without angina pectoris: Secondary | ICD-10-CM | POA: Diagnosis present

## 2019-05-07 DIAGNOSIS — K219 Gastro-esophageal reflux disease without esophagitis: Secondary | ICD-10-CM | POA: Diagnosis present

## 2019-05-07 DIAGNOSIS — R338 Other retention of urine: Secondary | ICD-10-CM | POA: Diagnosis present

## 2019-05-07 DIAGNOSIS — F17210 Nicotine dependence, cigarettes, uncomplicated: Secondary | ICD-10-CM | POA: Diagnosis present

## 2019-05-07 DIAGNOSIS — E871 Hypo-osmolality and hyponatremia: Secondary | ICD-10-CM | POA: Diagnosis present

## 2019-05-07 DIAGNOSIS — E538 Deficiency of other specified B group vitamins: Secondary | ICD-10-CM | POA: Diagnosis present

## 2019-05-07 DIAGNOSIS — Z7902 Long term (current) use of antithrombotics/antiplatelets: Secondary | ICD-10-CM

## 2019-05-07 DIAGNOSIS — I6522 Occlusion and stenosis of left carotid artery: Secondary | ICD-10-CM

## 2019-05-07 DIAGNOSIS — E1169 Type 2 diabetes mellitus with other specified complication: Secondary | ICD-10-CM | POA: Diagnosis not present

## 2019-05-07 DIAGNOSIS — K5900 Constipation, unspecified: Secondary | ICD-10-CM

## 2019-05-07 DIAGNOSIS — E7220 Disorder of urea cycle metabolism, unspecified: Secondary | ICD-10-CM | POA: Diagnosis present

## 2019-05-07 HISTORY — DX: Cerebral infarction, unspecified: I63.9

## 2019-05-07 LAB — BASIC METABOLIC PANEL
Anion gap: 11 (ref 5–15)
BUN: 43 mg/dL — ABNORMAL HIGH (ref 8–23)
CO2: 20 mmol/L — ABNORMAL LOW (ref 22–32)
Calcium: 8.3 mg/dL — ABNORMAL LOW (ref 8.9–10.3)
Chloride: 102 mmol/L (ref 98–111)
Creatinine, Ser: 4.1 mg/dL — ABNORMAL HIGH (ref 0.61–1.24)
GFR calc Af Amer: 16 mL/min — ABNORMAL LOW (ref >60)
GFR calc non Af Amer: 14 mL/min — ABNORMAL LOW (ref >60)
Glucose, Bld: 108 mg/dL — ABNORMAL HIGH (ref 70–99)
Potassium: 3.8 mmol/L (ref 3.5–5.1)
Sodium: 133 mmol/L — ABNORMAL LOW (ref 135–145)

## 2019-05-07 LAB — CBC
HCT: 29.5 % — ABNORMAL LOW (ref 39.0–52.0)
Hemoglobin: 9.3 g/dL — ABNORMAL LOW (ref 13.0–17.0)
MCH: 28.5 pg (ref 26.0–34.0)
MCHC: 31.5 g/dL (ref 30.0–36.0)
MCV: 90.5 fL (ref 80.0–100.0)
Platelets: 343 10*3/uL (ref 150–400)
RBC: 3.26 MIL/uL — ABNORMAL LOW (ref 4.22–5.81)
RDW: 16.4 % — ABNORMAL HIGH (ref 11.5–15.5)
WBC: 7.2 10*3/uL (ref 4.0–10.5)
nRBC: 0 % (ref 0.0–0.2)

## 2019-05-07 MED ORDER — ONDANSETRON 4 MG PO TBDP
4.0000 mg | ORAL_TABLET | Freq: Once | ORAL | Status: AC | PRN
  Administered 2019-05-07: 22:00:00 4 mg via ORAL
  Filled 2019-05-07: qty 1

## 2019-05-07 NOTE — Progress Notes (Signed)
Subjective: Jeremy Sherman is a 69 y.o. male patient with history of stroke on Eliquis but is unsure about current history of diabetes since medications have been changed since January when he was found to have a stroke secondary to an aneurysm who presents to office today complaining of long,mildly painful nails  while ambulating in shoes; unable to trim. Patient's wife states that patient does not remember a lot since having a stroke.  Wife reports that she did not know his nails were this long constantly kept socks on he kept his feet covered up only realized that his nails were this bad when the hospital doctor took his socks off to check his feet.  No other pedal complaints noted.  Review of Systems  All other systems reviewed and are negative.   Patient Active Problem List   Diagnosis Date Noted  . Orthostatic hypotension   . Chronic diastolic congestive heart failure ([place])   . Hypoalbuminemia due to protein-calorie malnutrition ([place])   . Acute blood loss anemia   . Transaminitis   . Diabetes mellitus type 2 in obese ([place])   . Benign prostatic hyperplasia with urinary retention   . Hypothyroidism   . 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 AKI (acute kidney injury) ([place])   . Aneurysm of anterior cerebral artery   . Essential hypertension   . Prediabetes   . Dysphagia, post-stroke   . Thrombus of left atrial appendage   . 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 Carotid stenosis 03/21/2019  . CAD (coronary artery disease) 03/21/2019  . Brain aneurysm 03/21/2019  . Stroke ([place]) 03/20/2019  . Grade II diastolic dysfunction 0000000  . Dizziness 03/11/2019  . Hypertensive urgency 03/11/2019   Current Outpatient Medications on File Prior to Visit  Medication Sig Dispense Refill  . ammonium lactate (LAC-HYDRIN) 12 % lotion Apply topically 2 (two) times daily. Apply to affected area 400 g 0  . apixaban (ELIQUIS) 5 MG TABS tablet Take 1 tablet (5 mg  total) by mouth 2 (two) times daily. 60 tablet 0  . atorvastatin (LIPITOR) 80 MG tablet Take 1 tablet (80 mg total) by mouth daily at 6 PM. 30 tablet 0  . diclofenac Sodium (VOLTAREN) 1 % GEL Apply 2 g topically 4 (four) times daily. 2 g 0  . levothyroxine (SYNTHROID) 100 MCG tablet Take 1 tablet (100 mcg total) by mouth daily at 6 (six) AM. 30 tablet 0  . pantoprazole (PROTONIX) 40 MG tablet Take 1 tablet (40 mg total) by mouth at bedtime. 30 tablet 0  . polyethylene glycol (MIRALAX / GLYCOLAX) 17 g packet Take 17 g by mouth daily. 14 each 0  . senna (SENOKOT) 8.6 MG TABS tablet Take 1 tablet (8.6 mg total) by mouth at bedtime. 120 tablet 0  . ticagrelor (BRILINTA) 90 MG TABS tablet Take 1 tablet (90 mg total) by mouth 2 (two) times daily. 60 tablet 0  . traMADol (ULTRAM) 50 MG tablet Take 1 tablet (50 mg total) by mouth every 6 (six) hours as needed for moderate pain or severe pain. 30 tablet 0   No current facility-administered medications on file prior to visit.   No Known Allergies   Objective: General: Patient is awake, alert, and oriented x 2 and crying during the visit  Integument: Skin is warm, dry and supple bilateral. Nails are tender, severely long, thickened and dystrophic with severe subungual debris, consistent with onychomycosis, 1-5 bilateral long overgrown curled over in a rams horn's appearance.  No signs of infection. No open lesions or preulcerative lesions present bilateral.  Dry skin diffusely bilateral.  Remaining integument unremarkable.  Vasculature:  Dorsalis Pedis pulse 1/4 bilateral. Posterior Tibial pulse  1/4 bilateral.  Capillary fill time <5 sec 1-5 bilateral.  No hair growth to the level of the digits. Temperature gradient within normal limits. No varicosities present bilateral.  Trace edema present bilateral.   Neurology: Hypersensitivity to light touch worse on right foot compared to left with history of stroke with right-sided weakness  Musculoskeletal:  Pes planus foot type with hammertoe deformity there is severe pain palpation to toenails especially right foot.  Assessment and Plan: Problem List Items Addressed This Visit      Endocrine   Diabetes mellitus type 2 in obese [facility])    Other Visit Diagnoses    Pain due to onychomycosis of toenails of both feet    -  Primary   Onychogryposis of toenail       Dry skin       Current use of anticoagulant therapy       History of stroke          -Examined patient. -Discussed and educated patient and wife on proper foot care, especially with  regards to the vascular, neurological and musculoskeletal systems.  -Mechanically debrided all nails 1-5 bilateral using sterile nail nipper and filed with dremel without incident to patient's tolerance -Recommend soaking and use of foods in cream foot miracle to dry skin areas and Vicks VapoRub to toenails for the thickness -Answered all patient questions -Patient to return  in 62 days for at risk foot care -Patient advised to call the office if any problems or questions arise in the meantime. Trimming patient's nails took over 30 minutes nails were severely elongated and required a combination of excessive amount of time plus a combination of sharp mechanical debridement and use of nail file with dremel.  Landis Martins, DPM

## 2019-05-07 NOTE — ED Notes (Signed)
Pt dry heaving in lobby. Pt given emesis bag. Family at bedside. Will continue to monitor.

## 2019-05-07 NOTE — ED Triage Notes (Addendum)
Pt states he is here because wife told him he needed to come.  Initial complaint said low creatinine.  Pt denies any complaints.  Denies pain.  Denies weakness.  Pt had visit with podiatrist this morning.  Attempting to locate pt's wife.   Wife states pt discharged from hospital on 2/25.  Home health RN drew blood yesterday and dr's office called today stating "creatinine was extremely low" and to come to ED.

## 2019-05-08 ENCOUNTER — Inpatient Hospital Stay (HOSPITAL_COMMUNITY): Payer: Medicare Other

## 2019-05-08 ENCOUNTER — Encounter (HOSPITAL_COMMUNITY): Payer: Self-pay | Admitting: Internal Medicine

## 2019-05-08 DIAGNOSIS — R5381 Other malaise: Secondary | ICD-10-CM

## 2019-05-08 DIAGNOSIS — G9341 Metabolic encephalopathy: Secondary | ICD-10-CM

## 2019-05-08 DIAGNOSIS — Z791 Long term (current) use of non-steroidal anti-inflammatories (NSAID): Secondary | ICD-10-CM | POA: Diagnosis not present

## 2019-05-08 DIAGNOSIS — T83511A Infection and inflammatory reaction due to indwelling urethral catheter, initial encounter: Secondary | ICD-10-CM

## 2019-05-08 DIAGNOSIS — I2510 Atherosclerotic heart disease of native coronary artery without angina pectoris: Secondary | ICD-10-CM | POA: Diagnosis present

## 2019-05-08 DIAGNOSIS — E872 Acidosis: Secondary | ICD-10-CM

## 2019-05-08 DIAGNOSIS — E785 Hyperlipidemia, unspecified: Secondary | ICD-10-CM | POA: Diagnosis present

## 2019-05-08 DIAGNOSIS — Z79899 Other long term (current) drug therapy: Secondary | ICD-10-CM | POA: Diagnosis not present

## 2019-05-08 DIAGNOSIS — F17210 Nicotine dependence, cigarettes, uncomplicated: Secondary | ICD-10-CM | POA: Diagnosis present

## 2019-05-08 DIAGNOSIS — R338 Other retention of urine: Secondary | ICD-10-CM | POA: Diagnosis present

## 2019-05-08 DIAGNOSIS — Z8673 Personal history of transient ischemic attack (TIA), and cerebral infarction without residual deficits: Secondary | ICD-10-CM | POA: Diagnosis not present

## 2019-05-08 DIAGNOSIS — R9431 Abnormal electrocardiogram [ECG] [EKG]: Secondary | ICD-10-CM

## 2019-05-08 DIAGNOSIS — I10 Essential (primary) hypertension: Secondary | ICD-10-CM | POA: Diagnosis not present

## 2019-05-08 DIAGNOSIS — E7220 Disorder of urea cycle metabolism, unspecified: Secondary | ICD-10-CM | POA: Diagnosis present

## 2019-05-08 DIAGNOSIS — E039 Hypothyroidism, unspecified: Secondary | ICD-10-CM | POA: Diagnosis present

## 2019-05-08 DIAGNOSIS — K219 Gastro-esophageal reflux disease without esophagitis: Secondary | ICD-10-CM | POA: Diagnosis present

## 2019-05-08 DIAGNOSIS — I959 Hypotension, unspecified: Secondary | ICD-10-CM | POA: Diagnosis present

## 2019-05-08 DIAGNOSIS — R319 Hematuria, unspecified: Secondary | ICD-10-CM

## 2019-05-08 DIAGNOSIS — N390 Urinary tract infection, site not specified: Secondary | ICD-10-CM

## 2019-05-08 DIAGNOSIS — R7989 Other specified abnormal findings of blood chemistry: Secondary | ICD-10-CM

## 2019-05-08 DIAGNOSIS — Z978 Presence of other specified devices: Secondary | ICD-10-CM | POA: Diagnosis not present

## 2019-05-08 DIAGNOSIS — D649 Anemia, unspecified: Secondary | ICD-10-CM

## 2019-05-08 DIAGNOSIS — Z951 Presence of aortocoronary bypass graft: Secondary | ICD-10-CM | POA: Diagnosis not present

## 2019-05-08 DIAGNOSIS — Z20822 Contact with and (suspected) exposure to covid-19: Secondary | ICD-10-CM | POA: Diagnosis present

## 2019-05-08 DIAGNOSIS — N401 Enlarged prostate with lower urinary tract symptoms: Secondary | ICD-10-CM

## 2019-05-08 DIAGNOSIS — Z823 Family history of stroke: Secondary | ICD-10-CM | POA: Diagnosis not present

## 2019-05-08 DIAGNOSIS — D519 Vitamin B12 deficiency anemia, unspecified: Secondary | ICD-10-CM

## 2019-05-08 DIAGNOSIS — C9000 Multiple myeloma not having achieved remission: Secondary | ICD-10-CM | POA: Diagnosis present

## 2019-05-08 DIAGNOSIS — N179 Acute kidney failure, unspecified: Secondary | ICD-10-CM | POA: Diagnosis present

## 2019-05-08 DIAGNOSIS — Z7901 Long term (current) use of anticoagulants: Secondary | ICD-10-CM | POA: Diagnosis not present

## 2019-05-08 DIAGNOSIS — I119 Hypertensive heart disease without heart failure: Secondary | ICD-10-CM | POA: Diagnosis present

## 2019-05-08 DIAGNOSIS — E871 Hypo-osmolality and hyponatremia: Secondary | ICD-10-CM | POA: Diagnosis present

## 2019-05-08 DIAGNOSIS — Z79890 Hormone replacement therapy (postmenopausal): Secondary | ICD-10-CM | POA: Diagnosis not present

## 2019-05-08 LAB — PROTEIN / CREATININE RATIO, URINE
Creatinine, Urine: 219.83 mg/dL
Protein Creatinine Ratio: 0.37 mg/mg{Cre} — ABNORMAL HIGH (ref 0.00–0.15)
Total Protein, Urine: 82 mg/dL

## 2019-05-08 LAB — RENAL FUNCTION PANEL
Albumin: 2.3 g/dL — ABNORMAL LOW (ref 3.5–5.0)
Anion gap: 12 (ref 5–15)
BUN: 51 mg/dL — ABNORMAL HIGH (ref 8–23)
CO2: 16 mmol/L — ABNORMAL LOW (ref 22–32)
Calcium: 7.9 mg/dL — ABNORMAL LOW (ref 8.9–10.3)
Chloride: 107 mmol/L (ref 98–111)
Creatinine, Ser: 4.72 mg/dL — ABNORMAL HIGH (ref 0.61–1.24)
GFR calc Af Amer: 13 mL/min — ABNORMAL LOW (ref >60)
GFR calc non Af Amer: 12 mL/min — ABNORMAL LOW (ref >60)
Glucose, Bld: 120 mg/dL — ABNORMAL HIGH (ref 70–99)
Phosphorus: 4.9 mg/dL — ABNORMAL HIGH (ref 2.5–4.6)
Potassium: 4.2 mmol/L (ref 3.5–5.1)
Sodium: 135 mmol/L (ref 135–145)

## 2019-05-08 LAB — URINALYSIS, ROUTINE W REFLEX MICROSCOPIC
Bilirubin Urine: NEGATIVE
Glucose, UA: NEGATIVE mg/dL
Ketones, ur: NEGATIVE mg/dL
Nitrite: NEGATIVE
Protein, ur: 100 mg/dL — AB
Specific Gravity, Urine: 1.012 (ref 1.005–1.030)
pH: 6 (ref 5.0–8.0)

## 2019-05-08 LAB — BASIC METABOLIC PANEL
Anion gap: 15 (ref 5–15)
Anion gap: 12 (ref 5–15)
BUN: 47 mg/dL — ABNORMAL HIGH (ref 8–23)
BUN: 47 mg/dL — ABNORMAL HIGH (ref 8–23)
CO2: 16 mmol/L — ABNORMAL LOW (ref 22–32)
CO2: 20 mmol/L — ABNORMAL LOW (ref 22–32)
Calcium: 8.4 mg/dL — ABNORMAL LOW (ref 8.9–10.3)
Calcium: 8.5 mg/dL — ABNORMAL LOW (ref 8.9–10.3)
Chloride: 100 mmol/L (ref 98–111)
Chloride: 102 mmol/L (ref 98–111)
Creatinine, Ser: 4.45 mg/dL — ABNORMAL HIGH (ref 0.61–1.24)
Creatinine, Ser: 4.59 mg/dL — ABNORMAL HIGH (ref 0.61–1.24)
GFR calc Af Amer: 14 mL/min — ABNORMAL LOW (ref >60)
GFR calc Af Amer: 14 mL/min — ABNORMAL LOW (ref >60)
GFR calc non Af Amer: 12 mL/min — ABNORMAL LOW (ref >60)
GFR calc non Af Amer: 12 mL/min — ABNORMAL LOW (ref >60)
Glucose, Bld: 131 mg/dL — ABNORMAL HIGH (ref 70–99)
Glucose, Bld: 123 mg/dL — ABNORMAL HIGH (ref 70–99)
Potassium: 5.8 mmol/L — ABNORMAL HIGH (ref 3.5–5.1)
Potassium: 4.7 mmol/L (ref 3.5–5.1)
Sodium: 131 mmol/L — ABNORMAL LOW (ref 135–145)
Sodium: 134 mmol/L — ABNORMAL LOW (ref 135–145)

## 2019-05-08 LAB — URINE CULTURE: Culture: NO GROWTH

## 2019-05-08 LAB — FERRITIN: Ferritin: 392 ng/mL — ABNORMAL HIGH (ref 24–336)

## 2019-05-08 LAB — SODIUM, URINE, RANDOM: Sodium, Ur: 19 mmol/L

## 2019-05-08 LAB — SARS CORONAVIRUS 2 (TAT 6-24 HRS): SARS Coronavirus 2: NEGATIVE

## 2019-05-08 LAB — CK: Total CK: 78 U/L (ref 49–397)

## 2019-05-08 LAB — IRON AND TIBC
Iron: 15 ug/dL — ABNORMAL LOW (ref 45–182)
Saturation Ratios: 9 % — ABNORMAL LOW (ref 17.9–39.5)
TIBC: 172 ug/dL — ABNORMAL LOW (ref 250–450)
UIBC: 157 ug/dL

## 2019-05-08 LAB — RETICULOCYTES
Immature Retic Fract: 17.3 % — ABNORMAL HIGH (ref 2.3–15.9)
RBC.: 2.69 MIL/uL — ABNORMAL LOW (ref 4.22–5.81)
Retic Count, Absolute: 22.6 10*3/uL (ref 19.0–186.0)
Retic Ct Pct: 0.8 % (ref 0.4–3.1)

## 2019-05-08 LAB — FOLATE: Folate: 28.7 ng/mL (ref >5.9)

## 2019-05-08 LAB — CREATININE, URINE, RANDOM: Creatinine, Urine: 209.12 mg/dL

## 2019-05-08 LAB — TSH: TSH: 6.549 u[IU]/mL — ABNORMAL HIGH (ref 0.350–4.500)

## 2019-05-08 LAB — AMMONIA: Ammonia: 43 umol/L — ABNORMAL HIGH (ref 9–35)

## 2019-05-08 LAB — LACTIC ACID, PLASMA: Lactic Acid, Venous: 1.9 mmol/L (ref 0.5–1.9)

## 2019-05-08 LAB — VITAMIN B12: Vitamin B-12: 142 pg/mL — ABNORMAL LOW (ref 180–914)

## 2019-05-08 LAB — MAGNESIUM: Magnesium: 2.9 mg/dL — ABNORMAL HIGH (ref 1.7–2.4)

## 2019-05-08 MED ORDER — VITAMIN B-12 1000 MCG PO TABS
1000.0000 ug | ORAL_TABLET | Freq: Every day | ORAL | Status: DC
  Administered 2019-05-08 – 2019-05-26 (×19): 1000 ug via ORAL
  Filled 2019-05-08 (×19): qty 1

## 2019-05-08 MED ORDER — LEVOTHYROXINE SODIUM 100 MCG PO TABS
100.0000 ug | ORAL_TABLET | Freq: Every day | ORAL | Status: DC
  Administered 2019-05-08 – 2019-05-26 (×19): 100 ug via ORAL
  Filled 2019-05-08 (×20): qty 1

## 2019-05-08 MED ORDER — INFLUENZA VAC A&B SA ADJ QUAD 0.5 ML IM PRSY
0.5000 mL | PREFILLED_SYRINGE | INTRAMUSCULAR | Status: DC
  Filled 2019-05-08: qty 0.5

## 2019-05-08 MED ORDER — SODIUM CHLORIDE 0.9 % IV SOLN
1.0000 g | Freq: Every day | INTRAVENOUS | Status: DC
  Administered 2019-05-08 (×2): 1 g via INTRAVENOUS
  Filled 2019-05-08: qty 10
  Filled 2019-05-08: qty 1
  Filled 2019-05-08: qty 10

## 2019-05-08 MED ORDER — ATORVASTATIN CALCIUM 80 MG PO TABS
80.0000 mg | ORAL_TABLET | Freq: Every day | ORAL | Status: DC
  Administered 2019-05-08 – 2019-05-25 (×18): 80 mg via ORAL
  Filled 2019-05-08 (×18): qty 1

## 2019-05-08 MED ORDER — SODIUM CHLORIDE 0.9 % IV SOLN: INTRAVENOUS | Status: AC

## 2019-05-08 MED ORDER — ACETAMINOPHEN 500 MG PO TABS
1000.0000 mg | ORAL_TABLET | Freq: Three times a day (TID) | ORAL | Status: DC
  Administered 2019-05-08 – 2019-05-12 (×13): 1000 mg via ORAL
  Filled 2019-05-08 (×13): qty 2

## 2019-05-08 MED ORDER — NEPRO/CARBSTEADY PO LIQD
237.0000 mL | Freq: Two times a day (BID) | ORAL | Status: DC
  Administered 2019-05-08 – 2019-05-15 (×9): 237 mL via ORAL

## 2019-05-08 MED ORDER — ACETAMINOPHEN 325 MG PO TABS
650.0000 mg | ORAL_TABLET | Freq: Four times a day (QID) | ORAL | Status: DC | PRN
  Filled 2019-05-08: qty 2

## 2019-05-08 MED ORDER — APIXABAN 5 MG PO TABS
5.0000 mg | ORAL_TABLET | Freq: Two times a day (BID) | ORAL | Status: DC
  Administered 2019-05-08 – 2019-05-14 (×14): 5 mg via ORAL
  Filled 2019-05-08 (×15): qty 1

## 2019-05-08 MED ORDER — SODIUM BICARBONATE 650 MG PO TABS
650.0000 mg | ORAL_TABLET | Freq: Every day | ORAL | Status: DC
  Administered 2019-05-08: 650 mg via ORAL
  Filled 2019-05-08: qty 1

## 2019-05-08 MED ORDER — CYANOCOBALAMIN 1000 MCG/ML IJ SOLN
1000.0000 ug | Freq: Once | INTRAMUSCULAR | Status: AC
  Administered 2019-05-08: 1000 ug via INTRAMUSCULAR
  Filled 2019-05-08: qty 1

## 2019-05-08 MED ORDER — PNEUMOCOCCAL VAC POLYVALENT 25 MCG/0.5ML IJ INJ
0.5000 mL | INJECTION | INTRAMUSCULAR | Status: DC
  Filled 2019-05-08: qty 0.5

## 2019-05-08 MED ORDER — TICAGRELOR 90 MG PO TABS
90.0000 mg | ORAL_TABLET | Freq: Two times a day (BID) | ORAL | Status: DC
  Administered 2019-05-08 – 2019-05-14 (×14): 90 mg via ORAL
  Filled 2019-05-08 (×14): qty 1

## 2019-05-08 MED ORDER — ACETAMINOPHEN 650 MG RE SUPP: 650.0000 mg | Freq: Four times a day (QID) | RECTAL | Status: DC | PRN

## 2019-05-08 MED ORDER — CHLORHEXIDINE GLUCONATE CLOTH 2 % EX PADS
6.0000 | MEDICATED_PAD | Freq: Every day | CUTANEOUS | Status: DC
  Administered 2019-05-08 – 2019-05-26 (×19): 6 via TOPICAL

## 2019-05-08 MED ORDER — SODIUM CHLORIDE 0.9 % IV BOLUS
250.0000 mL | Freq: Once | INTRAVENOUS | Status: AC
  Administered 2019-05-08: 250 mL via INTRAVENOUS

## 2019-05-08 NOTE — Consult Note (Addendum)
Jeremy Sherman Renal Consultation Note  Requesting MD: Wendee Beavers, MD  Indication for Consultation:  AKI   Chief complaint: "my wife brought me"  HPI:  Jeremy Sherman is a 69 y.o. male with a history of coronary artery disease, hypertension, and reported prediabetes who presented to the ER with abnormal outpatient labs.  He actually states he's not sure what happened and that his wife brought him; he was tearful to find out about any trouble with his kidneys when he and I spoke.  The patient was told to come to the ER after having blood work done due to his kidney function decreasing.  The patient was discharged from the hospital a couple of weeks ago and had a Foley catheter in at that time (which had remained in place).  He was found to have AKI with a creatinine of 4.45 BUN of 47 and potassium of 5.8 (hemolyzed) with a bicarb of 16.  Repeat potassium was 4.7.  Note Creatinine on 3/10 was 4.10 and creatinine had been 1.08 on 04/21/2019.  He denies nausea though had reported nausea earlier.  Denies fever/chills.  States eating ok.  No shortness of breath or swelling.  He denies NSAID use and states he's not aware if on any new meds.  Covid swab negative  Renal ultrasound returned with bladder decompressed with Foley and no hydronephrosis with kidneys 12.5 cm right and 12.3 cm left.  Echogenicity was within normal limits.  Per charting had urinary retention necessitating discharge with foley; flomax was discontinued at that time due to orthostasis.    300-400 mL dark yellow urine in foley.  Multiple prior contrasted procedures on chart review.  His wife is not at bedside and I was not able to reach her by phone x 2 attempts.   Creatinine, Ser  Date/Time Value Ref Range Status  05/08/2019 02:30 AM 4.59 (H) 0.61 - 1.24 mg/dL Final  05/08/2019 01:30 AM 4.45 (H) 0.61 - 1.24 mg/dL Final    Comment:    SPECIMEN HEMOLYZED. HEMOLYSIS MAY AFFECT INTEGRITY OF RESULTS.  05/07/2019  06:31 PM 4.10 (H) 0.61 - 1.24 mg/dL Final  04/21/2019 05:50 AM 1.08 0.61 - 1.24 mg/dL Final  04/04/2019 07:50 AM 1.15 0.61 - 1.24 mg/dL Final  03/31/2019 06:19 AM 0.93 0.61 - 1.24 mg/dL Final  03/30/2019 06:40 AM 0.97 0.61 - 1.24 mg/dL Final  03/29/2019 05:28 AM 1.10 0.61 - 1.24 mg/dL Final  03/28/2019 06:33 AM 1.36 (H) 0.61 - 1.24 mg/dL Final  03/27/2019 05:37 AM 0.96 0.61 - 1.24 mg/dL Final  03/26/2019 12:50 PM 1.00 0.61 - 1.24 mg/dL Final  03/26/2019 07:37 AM 1.00 0.61 - 1.24 mg/dL Final  03/26/2019 04:44 AM 1.19 0.61 - 1.24 mg/dL Final  03/25/2019 02:25 AM 1.22 0.61 - 1.24 mg/dL Final  03/24/2019 02:38 AM 1.52 (H) 0.61 - 1.24 mg/dL Final  03/23/2019 12:03 AM 1.43 (H) 0.61 - 1.24 mg/dL Final  03/22/2019 01:12 AM 1.06 0.61 - 1.24 mg/dL Final  03/21/2019 04:13 AM 1.12 0.61 - 1.24 mg/dL Final  03/20/2019 08:08 PM 1.14 0.61 - 1.24 mg/dL Final     PMHx:   Past Medical History:  Diagnosis Date  . CAD (coronary artery disease)   . Carotid stenosis, left    L ICA 50%  . Hyperlipidemia   . Hypertension   . Hypertensive urgency 03/11/2019  . 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 Prediabetes     Past Surgical History:  Procedure Laterality Date  . CORONARY ARTERY  BYPASS GRAFT  07/02/2007   [place]-> LAD SVG-> PD branch of RCA [name]) at [place] Left 03/26/2019   Procedure: EXPOSURE OF LEFT COMMON CAROTID ARTERY AND PLACEMENT OF SHEATH;  Surgeon: Marty Heck, MD;  Location: [place];  Service: Vascular;  Laterality: Left;  . ENDARTERECTOMY Left 03/26/2019   Procedure: REMOVAL OF CAROTID SHEATH AND CLOSURE OF  CAROTID ARTERY;  Surgeon: Serafina Mitchell, MD;  [facility];  Service: Vascular;  Laterality: Left;  . IR ANGIO INTRA EXTRACRAN SEL COM CAROTID INNOMINATE BILAT MOD SED  03/21/2019  . IR ANGIO INTRA EXTRACRAN SEL INTERNAL CAROTID UNI L MOD SED  03/26/2019  . IR ANGIO VERTEBRAL SEL SUBCLAVIAN INNOMINATE BILAT MOD SED   03/21/2019  . IR ANGIOGRAM FOLLOW UP STUDY  03/26/2019  . IR ANGIOGRAM FOLLOW UP STUDY  03/26/2019  . IR ANGIOGRAM FOLLOW UP STUDY  03/26/2019  . IR ANGIOGRAM FOLLOW UP STUDY  03/26/2019  . IR ANGIOGRAM FOLLOW UP STUDY  03/26/2019  . IR ANGIOGRAM FOLLOW UP STUDY  03/26/2019  . IR CT HEAD LTD  03/26/2019  . IR INTRA CRAN STENT  03/26/2019  . IR INTRAVSC STENT CERV CAROTID W/O EMB-PROT MOD SED INC ANGIO  03/26/2019  . IR NEURO EACH ADD'L AFTER BASIC UNI LEFT (MS)  03/26/2019  . IR TRANSCATH/EMBOLIZ  03/26/2019    Family Hx:  Family History  Problem Relation Age of Onset  . Stroke Mother   unable to reach family and patient is a poor historian  Social History:  reports that he has been smoking cigarettes. He started smoking about 45 years ago. He has a 22.50 pack-year smoking history. He has never used smokeless tobacco. He reports previous alcohol use. No history on file for drug.  Allergies: No Known Allergies  Medications: Prior to Admission medications   Medication Sig Start Date End Date Taking? Authorizing Provider  ammonium lactate (LAC-HYDRIN) 12 % lotion Apply topically 2 (two) times daily. Apply to affected area 04/23/19  Yes Angiulli, Lavon Paganini, PA-C  apixaban (ELIQUIS) 5 MG TABS tablet Take 1 tablet (5 mg total) by mouth 2 (two) times daily. 04/23/19  Yes Angiulli, Lavon Paganini, PA-C  atorvastatin (LIPITOR) 80 MG tablet Take 1 tablet (80 mg total) by mouth daily at 6 PM. 04/23/19  Yes Angiulli, Lavon Paganini, PA-C  diclofenac Sodium (VOLTAREN) 1 % GEL Apply 2 g topically 4 (four) times daily. 04/23/19  Yes Angiulli, Lavon Paganini, PA-C  levothyroxine (SYNTHROID) 100 MCG tablet Take 1 tablet (100 mcg total) by mouth daily at 6 (six) AM. 04/23/19  Yes Angiulli, Lavon Paganini, PA-C  pantoprazole (PROTONIX) 40 MG tablet Take 1 tablet (40 mg total) by mouth at bedtime. 04/23/19  Yes Angiulli, Lavon Paganini, PA-C  polyethylene glycol (MIRALAX / GLYCOLAX) 17 g packet Take 17 g by mouth daily. Patient taking differently:  Take 17 g by mouth daily as needed for mild constipation.  04/24/19  Yes Angiulli, Lavon Paganini, PA-C  senna (SENOKOT) 8.6 MG TABS tablet Take 1 tablet (8.6 mg total) by mouth at bedtime. Patient taking differently: Take 1 tablet by mouth at bedtime as needed for mild constipation.  04/23/19  Yes Angiulli, Lavon Paganini, PA-C  ticagrelor (BRILINTA) 90 MG TABS tablet Take 1 tablet (90 mg total) by mouth 2 (two) times daily. 04/23/19  Yes Angiulli, Lavon Paganini, PA-C  traMADol (ULTRAM) 50 MG tablet Take 1 tablet (50 mg total) by mouth every 6 (six) hours as  needed for moderate pain or severe pain. 04/23/19  Yes Angiulli, Lavon Paganini, PA-C    I have reviewed the patient's reported prior to presentation and current medications.  Labs:  BMP Latest Ref Rng & Units 05/08/2019 05/08/2019 05/07/2019  Glucose 70 - 99 mg/dL 123(H) 131(H) 108(H)  BUN 8 - 23 mg/dL 47(H) 47(H) 43(H)  Creatinine 0.61 - 1.24 mg/dL 4.59(H) 4.45(H) 4.10(H)  Sodium 135 - 145 mmol/L 134(L) 131(L) 133(L)  Potassium 3.5 - 5.1 mmol/L 4.7 5.8(H) 3.8  Chloride 98 - 111 mmol/L 102 100 102  CO2 22 - 32 mmol/L 20(L) 16(L) 20(L)  Calcium 8.9 - 10.3 mg/dL 8.5(L) 8.4(L) 8.3(L)    Urinalysis    Component Value Date/Time   COLORURINE YELLOW 05/07/2019 2352   APPEARANCEUR CLOUDY (A) 05/07/2019 2352   LABSPEC 1.012 05/07/2019 2352   PHURINE 6.0 05/07/2019 2352   GLUCOSEU NEGATIVE 05/07/2019 2352   HGBUR LARGE (A) 05/07/2019 2352   BILIRUBINUR NEGATIVE 05/07/2019 [place] 05/07/2019 2352   PROTEINUR 100 (A) 05/07/2019 2352   NITRITE NEGATIVE 05/07/2019 2352   LEUKOCYTESUR LARGE (A) 05/07/2019 2352     ROS:  Pertinent items noted in HPI and remainder of comprehensive ROS otherwise negative. though somewhat limited secondary to patient recall   Physical Exam: Vitals:   05/08/19 1045 05/08/19 1100  BP:  (!) 102/47  Pulse: 64 (!) 58  Resp: 14 12  Temp:    SpO2: 100% 100%     General:  Elderly male in stretcher in NAD HEENT:  NCAT  Eyes: EOMI sclera anicteric Neck: supple trachea midline no JVD Heart: S1S2 no rub Lungs: clear and unlabored on room air Abdomen: soft/NT/ND Extremities: Skin: lower anterior legs with scaling lesions Neuro: awake and oriented to person and year of "21st" but initially states October then guesses March Psych - tearful when we discuss kidney function and states he didn't though though this is reason for presentation  Assessment/Plan:  # AKI   - Pre-renal in part and some ischemic insults with hypotension though with hematuria and protein UA.  Setting of possible infection.  Appears dry on exam.  No hydro on [place]. UA 100 mg/dL proteinuria and 11-20 RBC with UTI.  Prior contrasted procedures but last appears in Jan - Continue foley catheter - Nursing to flush foley - appreciate their assistance - Normal saline minibolus and then NS at 75/hr x 12 hours - Check up/cr ratio to help direct further work-up  - updated to renal diet - renal panel this evening and have ordered for daily in AM - no acute indication for dialysis  - Complement and ANCA, ANA as below - Note PPI being held and would hold nonessential meds  # metabolic acidosis  - Setting of AKI  - sodium bicarbonate 650 mg daily for now   # urinary retention  - Continue foley catheter  # hematuria  - Setting of possible UTI though only few bacteria - With his significant AKI will send Complement and ANCA, ANA  # HTN   - Controlled  - Mild hypotension and for fluids as above  # Anemia normocytic  - concurrent AKI  - iron studies and folate pending; B 12 a bit low - May need to rule out plasma cell dyscrasia if no improvement with hydration   # UTI - culture pending though few bacteria on UA - note team has ordered abx   Claudia Desanctis 05/08/2019, 1:44 PM   Reached patient's wife and  she states that he was recently started on the brilinta, statin, eliquis, levothyroxine, and protonix.  Check CK given the statin.   She isn't aware of any NSAID use.  He has not been eating well in the past week - has been able to drink some.  He would get nausea without emesis and gag.  His foley was changed on Monday per home health nurse.  Claudia Desanctis 05/08/2019 6:59 PM

## 2019-05-08 NOTE — ED Notes (Signed)
Covid swab collected, labeled with 2 pt identifiers, and brought to lab 

## 2019-05-08 NOTE — ED Notes (Addendum)
Pt wheeled to ED Rm 28 from [place]. Pt breathing easy, non-labored. Equal rise and fall of chest noted. Speaking in full sentences. Hx stroke. Per wife, pt was discharged from the hospital 2 weeks ago with a foley catheter. Pt had blood work done and was told to come to ED due to his kidney function decreasing. Foley catheter inserted during last admission and remains in place. Wife denies any urinary/foley issues. Home health nurse changed foley out yesterday per wife. EEDP at bedside PIV initiated, 18G to [place]. IV flushes with 10 cc NS without s/s of infiltration. Positive blood return noted. Secured with tape and tegaderm

## 2019-05-08 NOTE — Plan of Care (Signed)
  Problem: Education: Goal: Knowledge of General Education information will improve Description Including pain rating scale, medication(s)/side effects and non-pharmacologic comfort measures Outcome: Progressing   

## 2019-05-08 NOTE — ED Provider Notes (Signed)
[facility] EMERGENCY DEPARTMENT Provider Note   CSN: [id number] Arrival date & time: 05/07/19  1746     History Chief Complaint  Patient presents with  . abnormal labs   Level 5 caveat due to altered mental status Wyler Southwood is a 69 y.o. male.  The history is provided by the patient and the spouse. The history is limited by the condition of the patient.   Patient presents from home for abnormal labs.  Patient was called by physician that revealed worsening renal failure on outpatient labs.  No fever/vomiting/diarrhea.  No falls or injuries.  Patient was recently admitted to hospital for a stroke.  Wife reports since being discharged over 2 weeks ago, he has had slowly worsening confusion.  He is also been reporting right-sided chest wall pain, no falls or trauma reported  he has a Foley catheter in place that has been draining urine. No other acute issues at this time.    Past Medical History:  Diagnosis Date  . CAD (coronary artery disease)   . Carotid stenosis, left    L ICA 50%  . Hyperlipidemia   . Hypertension   . Hypertensive urgency 03/11/2019  . 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 Prediabetes     Patient Active Problem List   Diagnosis Date Noted  . Orthostatic hypotension   . Chronic diastolic congestive heart failure ([place])   . Hypoalbuminemia due to protein-calorie malnutrition ([place])   . Acute blood loss anemia   . Transaminitis   . Diabetes mellitus type 2 in obese ([place])   . Benign prostatic hyperplasia with urinary retention   . Hypothyroidism   . 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 AKI (acute kidney injury) ([place])   . Aneurysm of anterior cerebral artery   . Essential hypertension   . Prediabetes   . Dysphagia, post-stroke   . Thrombus of left atrial appendage   . 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 Carotid stenosis 03/21/2019  . CAD (coronary artery disease) 03/21/2019  . Brain aneurysm  03/21/2019  . Stroke ([place]) 03/20/2019  . Grade II diastolic dysfunction 0000000  . Dizziness 03/11/2019  . Hypertensive urgency 03/11/2019    Past Surgical History:  Procedure Laterality Date  . CORONARY ARTERY BYPASS GRAFT  07/02/2007   [place]-> LAD SVG-> PD branch of RCA [name]) at [place] Left 03/26/2019   Procedure: EXPOSURE OF LEFT COMMON CAROTID ARTERY AND PLACEMENT OF SHEATH;  Surgeon: Marty Heck, MD;  Location: [facility];  Service: Vascular;  Laterality: Left;  . ENDARTERECTOMY Left 03/26/2019   Procedure: REMOVAL OF CAROTID SHEATH AND CLOSURE OF  CAROTID ARTERY;  Surgeon: Serafina Mitchell, MD;  Location[facility];  Service: Vascular;  Laterality: Left;  . IR ANGIO INTRA EXTRACRAN SEL COM CAROTID INNOMINATE BILAT MOD SED  03/21/2019  . IR ANGIO INTRA EXTRACRAN SEL INTERNAL CAROTID UNI L MOD SED  03/26/2019  . IR ANGIO VERTEBRAL SEL SUBCLAVIAN INNOMINATE BILAT MOD SED  03/21/2019  . IR ANGIOGRAM FOLLOW UP STUDY  03/26/2019  . IR ANGIOGRAM FOLLOW UP STUDY  03/26/2019  . IR ANGIOGRAM FOLLOW UP STUDY  03/26/2019  . IR ANGIOGRAM FOLLOW UP STUDY  03/26/2019  . IR ANGIOGRAM FOLLOW UP STUDY  03/26/2019  . IR ANGIOGRAM FOLLOW UP STUDY  03/26/2019  . IR CT HEAD LTD  03/26/2019  . IR INTRA CRAN STENT  03/26/2019  . IR INTRAVSC STENT  CERV CAROTID W/O EMB-PROT MOD SED INC ANGIO  03/26/2019  . IR NEURO EACH ADD'L AFTER BASIC UNI LEFT (MS)  03/26/2019  . IR TRANSCATH/EMBOLIZ  03/26/2019       Family History  Problem Relation Age of Onset  . Stroke Mother     Social History   Tobacco Use  . Smoking status: Current Every Day Smoker    Packs/day: 0.50    Years: 45.00    Pack years: 22.50    Types: Cigarettes    Start date: 02/27/1974  . Smokeless tobacco: Never Used  Substance Use Topics  . Alcohol use: Not Currently  . Drug use: Not on file    Home Medications Prior to Admission medications   Medication Sig Start Date End Date Taking?  Authorizing Provider  ammonium lactate (LAC-HYDRIN) 12 % lotion Apply topically 2 (two) times daily. Apply to affected area 04/23/19   Angiulli, Lavon Paganini, PA-C  apixaban (ELIQUIS) 5 MG TABS tablet Take 1 tablet (5 mg total) by mouth 2 (two) times daily. 04/23/19   Angiulli, Lavon Paganini, PA-C  atorvastatin (LIPITOR) 80 MG tablet Take 1 tablet (80 mg total) by mouth daily at 6 PM. 04/23/19   Angiulli, Lavon Paganini, PA-C  diclofenac Sodium (VOLTAREN) 1 % GEL Apply 2 g topically 4 (four) times daily. 04/23/19   Angiulli, Lavon Paganini, PA-C  levothyroxine (SYNTHROID) 100 MCG tablet Take 1 tablet (100 mcg total) by mouth daily at 6 (six) AM. 04/23/19   Angiulli, Lavon Paganini, PA-C  pantoprazole (PROTONIX) 40 MG tablet Take 1 tablet (40 mg total) by mouth at bedtime. 04/23/19   Angiulli, Lavon Paganini, PA-C  polyethylene glycol (MIRALAX / GLYCOLAX) 17 g packet Take 17 g by mouth daily. 04/24/19   Angiulli, Lavon Paganini, PA-C  senna (SENOKOT) 8.6 MG TABS tablet Take 1 tablet (8.6 mg total) by mouth at bedtime. 04/23/19   Angiulli, Lavon Paganini, PA-C  ticagrelor (BRILINTA) 90 MG TABS tablet Take 1 tablet (90 mg total) by mouth 2 (two) times daily. 04/23/19   Angiulli, Lavon Paganini, PA-C  traMADol (ULTRAM) 50 MG tablet Take 1 tablet (50 mg total) by mouth every 6 (six) hours as needed for moderate pain or severe pain. 04/23/19   Angiulli, Lavon Paganini, PA-C    Allergies    Patient has no known allergies.  Review of Systems   Review of Systems  Unable to perform ROS: Mental status change    Physical Exam Updated Vital Signs BP 95/78 (BP Location: Left Arm)   Pulse (!) 57   Temp 98.4 F (36.9 C) (Oral)   Resp 18   SpO2 98%   Physical Exam CONSTITUTIONAL: Elderly, no acute distress HEAD: Normocephalic/atraumatic EYES: EOMI ENMT: Mucous membranes moist NECK: supple no meningeal signs SPINE/BACK:entire spine nontender CV: S1/S2 noted, no murmurs/rubs/gallops noted LUNGS: Lungs are clear to auscultation bilaterally, no apparent distress  Chest-no chest wall tenderness or crepitus or bruising ABDOMEN: soft, nontender, no suprapubic tenderness GU - foley catheter in place NEURO: Pt is awake/alert, aphasia noted.  He is able to move all extremities x4 but does appear globally weak. EXTREMITIES: pulses normal/equal, full ROM, all other extremities/joints palpated/ranged and nontender SKIN: warm, color normal PSYCH: Anxious ED Results / Procedures / Treatments   Labs (all labs ordered are listed, but only abnormal results are displayed) Labs Reviewed  BASIC METABOLIC PANEL - Abnormal; Notable for the following components:      Result Value   Sodium 133 (*)    CO2 20 (*)  Glucose, Bld 108 (*)    BUN 43 (*)    Creatinine, Ser 4.10 (*)    Calcium 8.3 (*)    GFR calc non Af Amer 14 (*)    GFR calc Af Amer 16 (*)    All other components within normal limits  CBC - Abnormal; Notable for the following components:   RBC 3.26 (*)    Hemoglobin 9.3 (*)    HCT 29.5 (*)    RDW 16.4 (*)    All other components within normal limits  URINALYSIS, ROUTINE W REFLEX MICROSCOPIC - Abnormal; Notable for the following components:   APPearance CLOUDY (*)    Hgb urine dipstick LARGE (*)    Protein, ur 100 (*)    Leukocytes,Ua LARGE (*)    Bacteria, UA FEW (*)    All other components within normal limits  SARS CORONAVIRUS 2 (TAT 6-24 HRS)  URINE CULTURE  CULTURE, BLOOD (ROUTINE X 2)  CULTURE, BLOOD (ROUTINE X 2)  BASIC METABOLIC PANEL  SODIUM, URINE, RANDOM  CREATININE, URINE, RANDOM  MAGNESIUM  LACTIC ACID, PLASMA    EKG EKG Interpretation  Date/Time:  Wednesday May 07 2019 23:48:56 EST Ventricular Rate:  73 PR Interval:    QRS Duration: 115 QT Interval:  475 QTC Calculation: 524 R Axis:     Text Interpretation: Sinus rhythm Ventricular premature complex Probable left atrial enlargement Left ventricular hypertrophy Prolonged QT interval No significant change since last tracing Confirmed by Ripley Fraise [phone]) on  05/07/2019 11:53:09 PM   Radiology DG Chest Port 1 View  Result Date: 05/08/2019 CLINICAL DATA:  69 year old male with pain. EXAM: PORTABLE CHEST 1 VIEW COMPARISON:  Chest radiographs 04/02/2019 and earlier. FINDINGS: Portable AP semi upright view at 2349 hours. Right PICC line has been removed since last month. Stable cardiomegaly and mediastinal contours. Lung volumes are at the upper limits of normal. Allowing for portable technique the lungs are clear. Visualized tracheal air column is within normal limits. No pneumothorax. Prior sternotomy. IMPRESSION: Stable cardiomegaly. No acute cardiopulmonary abnormality. Electronically Signed   By: Genevie Ann M.D.   On: 05/08/2019 00:15    Procedures Procedures   Medications Ordered in ED Medications  acetaminophen (TYLENOL) tablet 650 mg (has no administration in time range)    Or  acetaminophen (TYLENOL) suppository 650 mg (has no administration in time range)  cefTRIAXone (ROCEPHIN) 1 g in sodium chloride 0.9 % 100 mL IVPB (has no administration in time range)  0.9 %  sodium chloride infusion (has no administration in time range)  ondansetron (ZOFRAN-ODT) disintegrating tablet 4 mg (4 mg Oral Given 05/07/19 2207)    ED Course  I have reviewed the triage vital signs and the nursing notes.  Pertinent labs & imaging results that were available during my care of the patient were reviewed by me and considered in my medical decision making (see chart for details).    MDM Rules/Calculators/A&P                      12:10 AM Patient with recent stroke presents with acute renal failure.  His creatinine is now up to 4. He has a catheter in place without have any difficulty producing urine He will need to be admitted for further work-up 1:10 AM Patient stable. Will admit for renal failure work-up. Unclear etiology of acute renal failure. Urine culture will be sent as patient has indwelling Foley on arrival to hospital D/w dr Marlowe Sax for admit Final  Clinical  Impression(s) / ED Diagnoses Final diagnoses:  AKI (acute kidney injury) [facility])    Rx / [place] Orders ED Discharge Orders    None       Ripley Fraise, MD 05/08/19 0110

## 2019-05-08 NOTE — ED Notes (Signed)
All meds given per [facility]. Name/DOB verified with pt. Labs drawn, labeled with 2 pt identifiers, and sent to lab. Pt taken to [place] In NAD

## 2019-05-08 NOTE — H&P (Addendum)
History and Physical    Thomson Macfadyen [id number] DOB: [birth year] DOA: 05/07/2019  PCP: Imagene Riches, NP Patient coming from: Home  Chief Complaint: Abnormal labs  HPI: Jeremy Sherman is a 69 y.o. male with medical history significant of CAD status post CABG, hypertension, hyperlipidemia, hypothyroidism, prediabetes, recent hospital admission for stroke, carotid artery disease status post stent on Eliquis and Brilinta, left atrial appendage thrombus presenting to the ED for evaluation of abnormal labs.  Patient was called by his physician after outpatient labs revealed worsening renal failure.  Patient appears confused and is not sure why he is here.  He has no complaints.  Denies fevers, chills, chest pain, cough, shortness of breath, nausea, vomiting, abdominal pain, diarrhea, flank pain, or dysuria.  No family available at this time.  Per ED provider's conversation with the patient's wife since his discharge from rehab 2 weeks ago he has had slowly worsening confusion.  He has an indwelling Foley catheter since his prior hospitalization which has been draining urine.  ED Course: Afebrile.  Labs showing AKI.  BUN 43, creatinine 4.1.  Baseline creatinine 0.9.  UA with large amount of leukocytes, 11-20 RBCs, 21-50 WBCs, and few bacteria.  Urine culture pending.  SARS-CoV-2 PCR test pending. Chest x-ray showing stable cardiomegaly and no acute cardiopulmonary abnormality.  Review of Systems:  All systems reviewed and apart from history of presenting illness, are negative.  Past Medical History:  Diagnosis Date  . CAD (coronary artery disease)   . Carotid stenosis, left    L ICA 50%  . Hyperlipidemia   . Hypertension   . Hypertensive urgency 03/11/2019  . 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 Prediabetes     Past Surgical History:  Procedure Laterality Date  . CORONARY ARTERY BYPASS GRAFT  07/02/2007   [place]-> LAD SVG-> PD branch of R[facility] [name]) at  [place] Left 03/26/2019   Procedure: EXPOSURE OF LEFT COMMON CAROTID ARTERY AND PLACEMENT OF SHEATH;  Surgeon: Marty Heck, MD;  Location: [facility];  Service: Vascular;  Laterality: Left;  . ENDARTERECTOMY Left 03/26/2019   Procedure: REMOVAL OF CAROTID SHEATH AND CLOSURE OF  CAROTID ARTERY;  Surgeon: Serafina Mitchell, MD;  Location: [facility];  Service: Vascular;  Laterality: Left;  . IR ANGIO INTRA EXTRACRAN SEL COM CAROTID INNOMINATE BILAT MOD SED  03/21/2019  . IR ANGIO INTRA EXTRACRAN SEL INTERNAL CAROTID UNI L MOD SED  03/26/2019  . IR ANGIO VERTEBRAL SEL SUBCLAVIAN INNOMINATE BILAT MOD SED  03/21/2019  . IR ANGIOGRAM FOLLOW UP STUDY  03/26/2019  . IR ANGIOGRAM FOLLOW UP STUDY  03/26/2019  . IR ANGIOGRAM FOLLOW UP STUDY  03/26/2019  . IR ANGIOGRAM FOLLOW UP STUDY  03/26/2019  . IR ANGIOGRAM FOLLOW UP STUDY  03/26/2019  . IR ANGIOGRAM FOLLOW UP STUDY  03/26/2019  . IR CT HEAD LTD  03/26/2019  . IR INTRA CRAN STENT  03/26/2019  . IR INTRAVSC STENT CERV CAROTID W/O EMB-PROT MOD SED INC ANGIO  03/26/2019  . IR NEURO EACH ADD'L AFTER BASIC UNI LEFT (MS)  03/26/2019  . IR TRANSCATH/EMBOLIZ  03/26/2019     reports that he has been smoking cigarettes. He started smoking about 45 years ago. He has a 22.50 pack-year smoking history. He has never used smokeless tobacco. He reports previous alcohol use. No history on file for drug.  No Known Allergies  Family History  Problem Relation Age of Onset  .  Stroke Mother     Prior to Admission medications   Medication Sig Start Date End Date Taking? Authorizing Provider  ammonium lactate (LAC-HYDRIN) 12 % lotion Apply topically 2 (two) times daily. Apply to affected area 04/23/19   Angiulli, Lavon Paganini, PA-C  apixaban (ELIQUIS) 5 MG TABS tablet Take 1 tablet (5 mg total) by mouth 2 (two) times daily. 04/23/19   Angiulli, Lavon Paganini, PA-C  atorvastatin (LIPITOR) 80 MG tablet Take 1 tablet (80 mg total) by mouth daily at 6 PM.  04/23/19   Angiulli, Lavon Paganini, PA-C  diclofenac Sodium (VOLTAREN) 1 % GEL Apply 2 g topically 4 (four) times daily. 04/23/19   Angiulli, Lavon Paganini, PA-C  levothyroxine (SYNTHROID) 100 MCG tablet Take 1 tablet (100 mcg total) by mouth daily at 6 (six) AM. 04/23/19   Angiulli, Lavon Paganini, PA-C  pantoprazole (PROTONIX) 40 MG tablet Take 1 tablet (40 mg total) by mouth at bedtime. 04/23/19   Angiulli, Lavon Paganini, PA-C  polyethylene glycol (MIRALAX / GLYCOLAX) 17 g packet Take 17 g by mouth daily. 04/24/19   Angiulli, Lavon Paganini, PA-C  senna (SENOKOT) 8.6 MG TABS tablet Take 1 tablet (8.6 mg total) by mouth at bedtime. 04/23/19   Angiulli, Lavon Paganini, PA-C  ticagrelor (BRILINTA) 90 MG TABS tablet Take 1 tablet (90 mg total) by mouth 2 (two) times daily. 04/23/19   Angiulli, Lavon Paganini, PA-C  traMADol (ULTRAM) 50 MG tablet Take 1 tablet (50 mg total) by mouth every 6 (six) hours as needed for moderate pain or severe pain. 04/23/19   Cathlyn Parsons, PA-C    Physical Exam: Vitals:   05/07/19 1750 05/07/19 2033 05/08/19 0000 05/08/19 0100  BP: (!) 146/63 95/78 129/65   Pulse: 71 (!) 57  69  Resp: 17 18 (!) 30 18  Temp: 98.4 F (36.9 C)     TempSrc: Oral     SpO2: 98% 98%  98%    Physical Exam  Constitutional: He appears well-developed and well-nourished. No distress.  HENT:  Head: Normocephalic.  Eyes: Right eye exhibits no discharge. Left eye exhibits no discharge.  Cardiovascular: Normal rate, regular rhythm and intact distal pulses.  Pulmonary/Chest: Effort normal and breath sounds normal. No respiratory distress. He has no wheezes. He has no rales.  Abdominal: Soft. Bowel sounds are normal. He exhibits no distension. There is no abdominal tenderness. There is no guarding.  Musculoskeletal:        General: No edema.     Cervical back: Neck supple.  Neurological: He is alert.  Awake and alert Oriented to person and place only Appears confused but able to follow commands appropriately Strength 5 out of  5 in bilateral upper and lower extremities. Sensation light touch intact throughout.  Skin: Skin is warm and dry. He is not diaphoretic.     Labs on Admission: I have personally reviewed following labs and imaging studies  CBC: Recent Labs  Lab 05/07/19 1831  WBC 7.2  HGB 9.3*  HCT 29.5*  MCV 90.5  PLT [id number]   Basic Metabolic Panel: Recent Labs  Lab 05/07/19 1831 05/08/19 0130  NA 133* 131*  K 3.8 5.8*  CL 102 100  CO2 20* 16*  GLUCOSE 108* 131*  BUN 43* 47*  CREATININE 4.10* 4.45*  CALCIUM 8.3* 8.4*  MG  --  2.9*   GFR: Estimated Creatinine Clearance: 19.5 mL/min (A) (by C-G formula based on SCr of 4.45 mg/dL (H)). Liver Function Tests: No results for input(s): AST, ALT,  ALKPHOS, BILITOT, PROT, ALBUMIN in the last 168 hours. No results for input(s): LIPASE, AMYLASE in the last 168 hours. Recent Labs  Lab 05/08/19 0120  AMMONIA 43*   Coagulation Profile: No results for input(s): INR, PROTIME in the last 168 hours. Cardiac Enzymes: No results for input(s): CKTOTAL, CKMB, CKMBINDEX, TROPONINI in the last 168 hours. BNP (last 3 results) No results for input(s): PROBNP in the last 8760 hours. HbA1C: No results for input(s): HGBA1C in the last 72 hours. CBG: No results for input(s): GLUCAP in the last 168 hours. Lipid Profile: No results for input(s): CHOL, HDL, LDLCALC, TRIG, CHOLHDL, LDLDIRECT in the last 72 hours. Thyroid Function Tests: No results for input(s): TSH, T4TOTAL, FREET4, T3FREE, THYROIDAB in the last 72 hours. Anemia Panel: No results for input(s): VITAMINB12, FOLATE, FERRITIN, TIBC, IRON, RETICCTPCT in the last 72 hours. Urine analysis:    Component Value Date/Time   COLORURINE YELLOW 05/07/2019 2352   APPEARANCEUR CLOUDY (A) 05/07/2019 2352   LABSPEC 1.012 05/07/2019 2352   PHURINE 6.0 05/07/2019 2352   GLUCOSEU NEGATIVE 05/07/2019 2352   HGBUR LARGE (A) 05/07/2019 2352   BILIRUBINUR NEGATIVE 05/07/2019 [place]  05/07/2019 2352   PROTEINUR 100 (A) 05/07/2019 2352   NITRITE NEGATIVE 05/07/2019 2352   LEUKOCYTESUR LARGE (A) 05/07/2019 2352    Radiological Exams on Admission: DG Chest Port 1 View  Result Date: 05/08/2019 CLINICAL DATA:  69 year old male with pain. EXAM: PORTABLE CHEST 1 VIEW COMPARISON:  Chest radiographs 04/02/2019 and earlier. FINDINGS: Portable AP semi upright view at 2349 hours. Right PICC line has been removed since last month. Stable cardiomegaly and mediastinal contours. Lung volumes are at the upper limits of normal. Allowing for portable technique the lungs are clear. Visualized tracheal air column is within normal limits. No pneumothorax. Prior sternotomy. IMPRESSION: Stable cardiomegaly. No acute cardiopulmonary abnormality. Electronically Signed   By: Genevie Ann M.D.   On: 05/08/2019 00:15    EKG: Independently reviewed.  Sinus rhythm, LVH, PVCs, QTC 524.  QT interval increased since prior tracing.  Assessment/Plan Principal Problem:   AKI (acute kidney injury) ([place]) Active Problems:   Essential hypertension   UTI (urinary tract infection) due to urinary indwelling catheter ([place])   Acute metabolic encephalopathy   QT prolongation   AKI: Unclear exact etiology.  BUN 43, creatinine 4.1.  Baseline creatinine 0.9. Possibly prerenal from dehydration/decreased p.o. intake.  No reports of nausea, vomiting, or diarrhea.  Not on any nephrotoxic medications at home.  Obstructive etiology less likely as patient has an indwelling Foley catheter which has been draining urine.  Plan is to give IV fluid hydration and order renal ultrasound.  Monitor renal function and urine output closely.  Check urine sodium and creatinine levels to calculate Fina.  Consult nephrology in a.m.  Addendum: Initial labs with potassium 3.8, repeat labs showing potassium 5.8.  I spoke to the lab and they verified that the second value is not correct as the specimen was hemolyzed. Stat repeat BMP has been  ordered. Bicarb was 20 on initial labs, repeat BMP if bicarb significantly low, will need to be supplemented.  CAUTI: Patient has an indwelling Foley catheter since his prior hospitalization.  UA suggestive of infection.  No fever or leukocytosis.  No signs of sepsis.  Give ceftriaxone and follow-up urine culture.  Check lactic acid level and order blood cultures.  Acute metabolic encephalopathy: Suspect related to uremia from acute renal failure and UTI.  No gross focal neuro deficit  appreciated on exam.  Will order head CT given recent stroke.  Continue management of AKI and UTI as mentioned above.  Also check TSH, B12, and ammonia levels.  QT prolongation on EKG: Continue cardiac monitoring.  Monitor potassium and magnesium levels.  Avoid QT prolonging drugs if possible.  Repeat EKG in a.m.  Hypertension: Currently normotensive.  Resume home medications after pharmacy medication reconciliation has been done.  DVT prophylaxis: On full dose anticoagulation at home. Code Status: Full code Family Communication: No family available at this time. Disposition Plan: Anticipate discharge after improvement of renal function and mental status. Consults called: None Admission status: It is my clinical opinion that admission to INPATIENT is reasonable and necessary because of the expectation that this patient will require hospital care that crosses at least 2 midnights to treat this condition based on the medical complexity of the problems presented.  Given the aforementioned information, the predictability of an adverse outcome is felt to be significant.  The medical decision making on this patient was of high complexity and the patient is at high risk for clinical deterioration, therefore this is a level 3 visit.  Shela Leff MD [facility]  If 7PM-7AM, please contact night-coverage [email or web address]  05/08/2019, 2:23 AM

## 2019-05-08 NOTE — ED Notes (Signed)
Report received patient repositioned in bed , adult diaper soiled patient cleaned and linens changed hospital bed ordered.

## 2019-05-08 NOTE — ED Notes (Signed)
Report given to Kelly, RN.

## 2019-05-08 NOTE — Progress Notes (Signed)
PROGRESS NOTE  Jeremy Sherman [id number] DOB: [birth year]   PCP: Imagene Riches, NP  Patient is from: home. Uses walker with help recently.   DOA: 05/07/2019 LOS: 0  Brief Narrative / Interim history: 69 year old male with history of CAD/CABG in 2009, CVA/left CAS/left MCA stenosis s/p Lt CEA and left MCA stent in 02/2019 on Brilinta, left atrial appendage thrombus on Eliquis, chronic Foley, HTN, hypothyroidism, HLD and prediabetes directed to ED by PCP for evaluation of AKI.  Per family, has some confusion as well.  In ED, hemodynamically stable.  Creatinine 4.1 (baseline 0.9-1). Na 133.  K3.8.  BUN 43.  Hgb 9.3.  Bicarb 20.  AG 12.  Ammonia 43.  LA 1.9.  UA concerning for UTI.  CT head without acute finding.  CXR stable cardiomegaly.  Renal ultrasound without significant finding.  COVID-19 negative.  Patient was admitted for AKI, CAUTI and AME.  Started on IV fluid and ceftriaxone.  Subjective: No major events this morning.  Reports generalized pain.  Denies shortness of breath, nausea, vomiting, abdominal pain or diarrhea.  He is oriented to self, place, month and year but no insight to why he is in the hospital.   Objective: Vitals:   05/08/19 0815 05/08/19 0830 05/08/19 0845 05/08/19 0900  BP: (!) 153/73 117/72 (!) 127/91 123/62  Pulse:  68  67  Resp: (!) 22 12 (!) 21 12  Temp:      TempSrc:      SpO2:  100%  100%   No intake or output data in the 24 hours ending 05/08/19 0951 There were no vitals filed for this visit.  Examination:  GENERAL: No acute distress.  Appears well.  HEENT: MMM.  Vision and hearing grossly intact.  NECK: Supple.  No apparent JVD.  RESP: On room air.  No IWOB. Good air movement bilaterally. CVS:  RRR. Heart sounds normal.  ABD/GI/GU: Bowel sounds present. Soft. Non tender.  MSK/EXT:  Moves extremities. No apparent deformity. No edema.  SKIN: no apparent skin lesion or wound NEURO: Awake, alert and oriented x4 except date.  No  apparent focal neuro deficit other than BLE weakness 3+/5 bilaterally.  PSYCH: Calm but limited insight.  Procedures:  None  Assessment & Plan: Acute kidney injury with azotemia: Baseline Cr 0.9-1.0> 4.1 (admit)> 4.59.  BUN 43> 47.  FENa 0.3% suggestive for prerenal cytology likely from poor p.o. intake.  Not nephrotoxic meds on his list other than topical Voltaren gel.  No nausea, vomiting or diarrhea.  Renal US without significant finding.  Urinalysis concerning for UTI with hematuria.  -Continue IV fluid -Nephrology consulted -Avoid nephrotoxic meds -Treat CAUTI as below  CAUTI with hematuria in patient with chronic indwelling Foley catheter due to urine retention/BPH: POA.  Urinalysis concerning.  However, no leukocytosis or fever.  No suprapubic or CVA tenderness -Continue IV ceftriaxone -Follow urine cultures -Exchange Foley  Acute metabolic encephalopathy: Multifactorial-dehydration, AKI with azotemia, UTI, hyperammonia, low vitamin B12 and tramadol might contribute if he has been taking.  No focal neuro deficits other than BLE weakness.  TSH slightly high but markedly improved from prior.  He is oriented to self, place, month and year but slow. -Treat treatable causes-UTI, AKI, vitamin B12 deficiency and hyperammonia -Delirium precautions and frequent reorientation's  Normocytic anemia: Baseline Hgb 7-9> 9.3 (admit)>>. Vitamin B12 low at 142. -Check anemia panel -Replenish vitamin B12 -Monitor H&H  CVA/left CAS/left MCA stenosis s/p Lt CEA and left MCA stent in 02/2019 on Brilinta, left  atrial appendage thrombus on Eliquis.  No focal neuro deficit other than BLE weakness.  He was discharged home from rehab on 2/25. -Continue Eliquis and Brilinta -Continue started -PT/OT  History of CAD/CABG in 2009-no cardiopulmonary symptoms. -Continue Lipitor, Eliquis and Brilinta as above.  Hypothyroidism: TSH 6.5 (45 about 2 months ago) -Continue home Synthroid  Mild metabolic  acidosis: Likely due to renal failure -Continue monitoring  Debility generalized weakness -PT/OT  Chronic pain -Scheduled Tylenol 1 g 3 times daily -Avoid sedating medications  GERD -Continue PPI  Nutrition: poor by mouth intake and nausea -Consult dietitian                 DVT prophylaxis: On Eliquis Code Status: Full code Family Communication: Updated patient's wife over the phone.  Discharge barrier: AKI, cardiology, encephalopathy and generalized weakness Patient is from: Home Final disposition: To be determined  Consultants: Nephrology   Microbiology summarized: [id number] negative Urine culture pending  Sch Meds:  Scheduled Meds: . apixaban  5 mg Oral BID  . atorvastatin  80 mg Oral q1800  . levothyroxine  100 mcg Oral Q0600  . ticagrelor  90 mg Oral BID   Continuous Infusions: . sodium chloride 125 mL/hr at 05/08/19 0118  . cefTRIAXone (ROCEPHIN)  IV Stopped (05/08/19 0147)   PRN Meds:.acetaminophen **OR** acetaminophen  Antimicrobials: Anti-infectives (From admission, onward)   Start     Dose/Rate Route Frequency Ordered Stop   05/08/19 0130  cefTRIAXone (ROCEPHIN) 1 g in sodium chloride 0.9 % 100 mL IVPB     1 g 200 mL/hr over 30 Minutes Intravenous Daily at bedtime 05/08/19 0100         I have personally reviewed the following labs and images: CBC: Recent Labs  Lab 05/07/19 1831  WBC 7.2  HGB 9.3*  HCT 29.5*  MCV 90.5  PLT 343   BMP &GFR Recent Labs  Lab 05/07/19 1831 05/08/19 0130 05/08/19 0230  NA 133* 131* 134*  K 3.8 5.8* 4.7  CL 102 100 102  CO2 20* 16* 20*  GLUCOSE 108* 131* 123*  BUN 43* 47* 47*  CREATININE 4.10* 4.45* 4.59*  CALCIUM 8.3* 8.4* 8.5*  MG  --  2.9*  --    Estimated Creatinine Clearance: 18.9 mL/min (A) (by C-G formula based on SCr of 4.59 mg/dL (H)). Liver & Pancreas: No results for input(s): AST, ALT, ALKPHOS, BILITOT, PROT, ALBUMIN in the last 168 hours. No results for input(s): LIPASE,  AMYLASE in the last 168 hours. Recent Labs  Lab 05/08/19 0120  AMMONIA 43*   Diabetic: No results for input(s): HGBA1C in the last 72 hours. No results for input(s): GLUCAP in the last 168 hours. Cardiac Enzymes: No results for input(s): CKTOTAL, CKMB, CKMBINDEX, TROPONINI in the last 168 hours. No results for input(s): PROBNP in the last 8760 hours. Coagulation Profile: No results for input(s): INR, PROTIME in the last 168 hours. Thyroid Function Tests: No results for input(s): TSH, T4TOTAL, FREET4, T3FREE, THYROIDAB in the last 72 hours. Lipid Profile: No results for input(s): CHOL, HDL, LDLCALC, TRIG, CHOLHDL, LDLDIRECT in the last 72 hours. Anemia Panel: No results for input(s): VITAMINB12, FOLATE, FERRITIN, TIBC, IRON, RETICCTPCT in the last 72 hours. Urine analysis:    Component Value Date/Time   COLORURINE YELLOW 05/07/2019 2352   APPEARANCEUR CLOUDY (A) 05/07/2019 2352   LABSPEC 1.012 05/07/2019 2352   PHURINE 6.0 05/07/2019 2352   GLUCOSEU NEGATIVE 05/07/2019 2352   HGBUR LARGE (A) 05/07/2019 2352   BILIRUBINUR NEGATIVE  05/07/2019 [place] 05/07/2019 2352   PROTEINUR 100 (A) 05/07/2019 2352   NITRITE NEGATIVE 05/07/2019 2352   LEUKOCYTESUR LARGE (A) 05/07/2019 2352   Sepsis Labs: Invalid input(s): PROCALCITONIN, [place]  Microbiology: Recent Results (from the past 240 hour(s))  SARS CORONAVIRUS 2 (TAT 6-24 HRS) Nasopharyngeal Nasopharyngeal Swab     Status: None   Collection Time: 05/07/19  8:48 PM   Specimen: Nasopharyngeal Swab  Result Value Ref Range Status   SARS Coronavirus 2 NEGATIVE NEGATIVE Final    Comment: (NOTE) SARS-CoV-2 target nucleic acids are NOT DETECTED. The SARS-CoV-2 RNA is generally detectable in upper and lower respiratory specimens during the acute phase of infection. Negative results do not preclude SARS-CoV-2 infection, do not rule out co-infections with other pathogens, and should not be used as the sole basis  for treatment or other patient management decisions. Negative results must be combined with clinical observations, patient history, and epidemiological information. The expected result is Negative. Fact Sheet for Patients: SugarRoll.be Fact Sheet for Healthcare Providers: https://www.woods-mathews.com/ This test is not yet approved or cleared by the [place] FDA and  has been authorized for detection and/or diagnosis of SARS-CoV-2 by FDA under an Emergency Use Authorization (EUA). This EUA will remain  in effect (meaning this test can be used) for the duration of the COVID-19 declaration under Section 56 4(b)(1) of the Act, 21 U.S.C. section 360bbb-3(b)(1), unless the authorization is terminated or revoked sooner. Performed at [facility], [place] [address] [place], [place] [address]     Radiology Studies: CT HEAD WO CONTRAST  Result Date: 05/08/2019 CLINICAL DATA:  69 year old male with encephalopathy. EXAM: CT HEAD WITHOUT CONTRAST TECHNIQUE: Contiguous axial images were obtained from the base of the skull through the vertex without intravenous contrast. COMPARISON:  Brain MRI 03/18/2019. CTA head and neck 03/26/2019. FINDINGS: Brain: Mild streak artifact associated with a distal ACA region aneurysm coil pack. No midline shift, ventriculomegaly, mass effect, evidence of mass lesion, intracranial hemorrhage or evidence of cortically based acute infarction. Gray-white matter differentiation is within normal limits throughout the brain. Mild for age white matter hypodensity, most pronounced in the right cerebellum. Vascular: No suspicious intracranial vascular hyperdensity. Skull: No acute osseous abnormality identified. Sinuses/Orbits: The visible paranasal sinuses have cleared since January. Tympanic cavities and mastoids remain well pneumatized. Other: No acute orbit or scalp soft tissue finding. IMPRESSION: 1. No acute intracranial  abnormality. 2. Left MCA M1 and distal ACA stents with pericallosal region coil pack redemonstrated. Electronically Signed   By: Genevie Ann M.D.   On: 05/08/2019 02:30   US RENAL  Result Date: 05/08/2019 CLINICAL DATA:  Acute kidney injury EXAM: RENAL / URINARY TRACT ULTRASOUND COMPLETE COMPARISON:  March 24, 2019 FINDINGS: Right Kidney: Renal measurements: 12.5 x 5.7 x 4.2 cm = volume: 156 mL . Echogenicity within normal limits. No mass or hydronephrosis visualized. Left Kidney: Renal measurements: 12.3 x 6.1 x 4.6 cm = volume: 180 mL. Echogenicity within normal limits. No mass or hydronephrosis visualized. Bladder: The bladder is decompressed with a Foley catheter. Other: None. IMPRESSION: No acute abnormality.  No hydronephrosis. Electronically Signed   By: Constance Holster M.D.   On: 05/08/2019 02:49   DG Chest Port 1 View  Result Date: 05/08/2019 CLINICAL DATA:  69 year old male with pain. EXAM: PORTABLE CHEST 1 VIEW COMPARISON:  Chest radiographs 04/02/2019 and earlier. FINDINGS: Portable AP semi upright view at 2349 hours. Right PICC line has been removed since last month. Stable cardiomegaly  and mediastinal contours. Lung volumes are at the upper limits of normal. Allowing for portable technique the lungs are clear. Visualized tracheal air column is within normal limits. No pneumothorax. Prior sternotomy. IMPRESSION: Stable cardiomegaly. No acute cardiopulmonary abnormality. Electronically Signed   By: Genevie Ann M.D.   On: 05/08/2019 00:15   45 minutes with more than 50% spent in reviewing records, counseling patient/family and coordinating care.   Taylor Levick T. [place]  If 7PM-7AM, please contact night-coverage [email or web address] Password [facility] 05/08/2019, 9:51 AM

## 2019-05-08 NOTE — ED Notes (Signed)
EKG completed

## 2019-05-08 NOTE — ED Notes (Signed)
Per lab, urine can be added on to sample already sent down

## 2019-05-08 NOTE — ED Notes (Signed)
Breakfast ordered 

## 2019-05-08 NOTE — ED Notes (Signed)
Received call from lab. Reports pt's CMP hemolyzed. Repeat CMP drawn, labeled with 2 pt identifiers, and sent to lab

## 2019-05-08 NOTE — ED Notes (Signed)
Lunch Tray Ordered @ 1041. 

## 2019-05-08 NOTE — Evaluation (Signed)
Physical Therapy Evaluation Patient Details Name: Jeremy Sherman MRN: [id number] DOB: [birth year] Today's Date: 05/08/2019   History of Present Illness  69 year old male admitted to ED on 3/10 with AKI, AMS, R chest wall pain. Pt with foley catheter use x2 weeks. Pt with recent hospitalization for CVA and aneurysm repair, d/c from CIR on 04/21/2019. 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  Clinical Impression   Pt presents with generalized weakness, altered mental status, difficulty performing mobility tasks, unsteadiness in standing with significant fear of falling, and decreased activity tolerance. Pt to benefit from acute PT to address deficits. Pt ambulated short room distance with min assist and use of RW, and overall required min-mod assist to safely mobilize. PT recommending continue with HHPT and 24/7 assist from family, per pt and pt's wife this is their wish. PT to progress mobility as tolerated, and will continue to follow acutely.      Follow Up Recommendations Home health PT;Supervision/Assistance - 24 hour    Equipment Recommendations  Hospital bed    Recommendations for Other Services       Precautions / Restrictions Precautions Precautions: Fall Restrictions Weight Bearing Restrictions: No      Mobility  Bed Mobility Overal bed mobility: Needs Assistance Bed Mobility: Rolling;Sidelying to Sit;Sit to Supine Rolling: Mod assist Sidelying to sit: Mod assist;HOB elevated   Sit to supine: Mod assist;HOB elevated   General bed mobility comments: mod assist for rolling to R for trunk and LE translation, Pt with initiation for use of bedrail with verbal and tactile cuing. MOd assist for supine<>sit for trunk and LE management, scooting to and from EOB.  Transfers Overall transfer level: Needs assistance Equipment used:  Rolling walker (2 wheeled) Transfers: Sit to/from Stand Sit to Stand: Mod assist;From elevated surface         General transfer comment: Mod assist for power up, steadying, VERY increased time to rise and transition hand placement from bed to RW. Pt demonstrated ability to march in place and step prior to gait initiation.  Ambulation/Gait Ambulation/Gait assistance: Min assist Gait Distance (Feet): 10 Feet Assistive device: Rolling walker (2 wheeled) Gait Pattern/deviations: Step-through pattern;Decreased stride length;Trunk flexed;Narrow base of support Gait velocity: decr   General Gait Details: Min assist for steadying, guiding pt and RW. Max verbal cuing for widening BOS, upright posture.  Stairs            Wheelchair Mobility    Modified Rankin (Stroke Patients Only)       Balance Overall balance assessment: Needs assistance Sitting-balance support: No upper extremity supported;Feet supported Sitting balance-Leahy Scale: Fair     Standing balance support: Bilateral upper extremity supported;During functional activity Standing balance-Leahy Scale: Poor Standing balance comment: reliant on external support                             Pertinent Vitals/Pain Pain Assessment: Faces Faces Pain Scale: Hurts even more Pain Location: R chest wall, LLE from sustained positioning Pain Descriptors / Indicators: Discomfort;Crying;Moaning;Guarding Pain Intervention(s): Limited activity within patient's tolerance;Monitored during session;Repositioned    Home Living Family/patient expects to be discharged to:: Private residence Living Arrangements: Spouse/significant other Available Help at Discharge: Family;Available 24 hours/day Type of Home: House Home Access: Stairs to enter Entrance Stairs-Rails: None Entrance Stairs-Number of Steps: 4 in back, 2 in front Home Layout: One level Home Equipment:  Cane - single point;Walker - 2 wheels;Bedside commode       Prior Function Level of Independence: Needs assistance   Gait / Transfers Assistance Needed: prior to this hospitalization, pt and pt's wife report limited household ambulation with RW. Pt working on tolerance for ambulation with HHPT. Was receiving HHPT, OT PTA.  ADL's / Homemaking Assistance Needed: Per wife, pt required assist for dressing, bathing, repositioning in bed all throughout the night, toileting. Pt's wife cooks and cleans.        Hand Dominance   Dominant Hand: Right    Extremity/Trunk Assessment   Upper Extremity Assessment Upper Extremity Assessment: Defer to OT evaluation    Lower Extremity Assessment Lower Extremity Assessment: Generalized weakness(Able to perform full AROM hip flexion, knee extension)    Cervical / Trunk Assessment Cervical / Trunk Assessment: Normal  Communication   Communication: No difficulties  Cognition Arousal/Alertness: Awake/alert Behavior During Therapy: Restless Overall Cognitive Status: Impaired/Different from baseline Area of Impairment: Orientation;Attention;Memory;Following commands;Safety/judgement;Problem solving                 Orientation Level: Disoriented to;Place;Time;Situation Current Attention Level: Sustained Memory: Decreased short-term memory Following Commands: Follows one step commands with increased time;Follows one step commands consistently Safety/Judgement: Decreased awareness of safety;Decreased awareness of deficits   Problem Solving: Slow processing;Decreased initiation;Difficulty sequencing;Requires verbal cues;Requires tactile cues General Comments: Pt states "I don't know where I am", after being oriented to location 10 minutes prior. Pt labile and tearful multiple times during session, due to pain and due to fear of falling per pt.      General Comments General comments (skin integrity, edema, etc.): VSS    Exercises     Assessment/Plan    PT Assessment Patient needs continued PT  services  PT Problem List Decreased strength;Decreased mobility;Decreased range of motion;Decreased activity tolerance;Decreased cognition;Decreased balance;Decreased knowledge of use of DME;Pain;Decreased safety awareness       PT Treatment Interventions DME instruction;Therapeutic activities;Gait training;Therapeutic exercise;Patient/family education;Balance training;Stair training;Functional mobility training;Neuromuscular re-education    PT Goals (Current goals can be found in the Care Plan section)  Acute Rehab PT Goals Patient Stated Goal: go home PT Goal Formulation: With patient Time For Goal Achievement: 05/22/19 Potential to Achieve Goals: Good    Frequency Min 3X/week   Barriers to discharge        Co-evaluation               AM-PAC PT "6 Clicks" Mobility  Outcome Measure Help needed turning from your back to your side while in a flat bed without using bedrails?: A Lot Help needed moving from lying on your back to sitting on the side of a flat bed without using bedrails?: A Lot Help needed moving to and from a bed to a chair (including a wheelchair)?: A Lot Help needed standing up from a chair using your arms (e.g., wheelchair or bedside chair)?: A Lot Help needed to walk in hospital room?: A Little Help needed climbing 3-5 steps with a railing? : A Lot 6 Click Score: 13    End of Session Equipment Utilized During Treatment: Gait belt Activity Tolerance: Patient limited by fatigue Patient left: in bed;with bed alarm set;with call bell/phone within reach Nurse Communication: Mobility status PT Visit Diagnosis: Other abnormalities of gait and mobility (R26.89);Muscle weakness (generalized) (M62.81)    Time: [id number] PT Time Calculation (min) (ACUTE ONLY): 33 min   Charges:   PT Evaluation $PT Eval Low Complexity: 1 Low PT Treatments $Gait Training: 8-22  mins        Marisa Cyphers, [place] Pager [phone]  Office  [phone]   Roxine Caddy D Elonda Husky 05/08/2019, 4:36 PM

## 2019-05-08 NOTE — ED Notes (Signed)
Lab called to inform this RN that TSH and B12 hemolyzed.  Primary RN notified.

## 2019-05-09 DIAGNOSIS — E871 Hypo-osmolality and hyponatremia: Secondary | ICD-10-CM

## 2019-05-09 DIAGNOSIS — I513 Intracardiac thrombosis, not elsewhere classified: Secondary | ICD-10-CM

## 2019-05-09 DIAGNOSIS — R8281 Pyuria: Secondary | ICD-10-CM

## 2019-05-09 DIAGNOSIS — Z978 Presence of other specified devices: Secondary | ICD-10-CM

## 2019-05-09 DIAGNOSIS — R3121 Asymptomatic microscopic hematuria: Secondary | ICD-10-CM

## 2019-05-09 LAB — ANA: Anti Nuclear Antibody (ANA): NEGATIVE

## 2019-05-09 LAB — RENAL FUNCTION PANEL
Albumin: 2.2 g/dL — ABNORMAL LOW (ref 3.5–5.0)
Anion gap: 10 (ref 5–15)
BUN: 47 mg/dL — ABNORMAL HIGH (ref 8–23)
CO2: 18 mmol/L — ABNORMAL LOW (ref 22–32)
Calcium: 7.7 mg/dL — ABNORMAL LOW (ref 8.9–10.3)
Chloride: 108 mmol/L (ref 98–111)
Creatinine, Ser: 4.35 mg/dL — ABNORMAL HIGH (ref 0.61–1.24)
GFR calc Af Amer: 15 mL/min — ABNORMAL LOW (ref >60)
GFR calc non Af Amer: 13 mL/min — ABNORMAL LOW (ref >60)
Glucose, Bld: 88 mg/dL (ref 70–99)
Phosphorus: 4.9 mg/dL — ABNORMAL HIGH (ref 2.5–4.6)
Potassium: 4.3 mmol/L (ref 3.5–5.1)
Sodium: 136 mmol/L (ref 135–145)

## 2019-05-09 LAB — CBC
HCT: 24.5 % — ABNORMAL LOW (ref 39.0–52.0)
Hemoglobin: 7.8 g/dL — ABNORMAL LOW (ref 13.0–17.0)
MCH: 29.1 pg (ref 26.0–34.0)
MCHC: 31.8 g/dL (ref 30.0–36.0)
MCV: 91.4 fL (ref 80.0–100.0)
Platelets: 302 10*3/uL (ref 150–400)
RBC: 2.68 MIL/uL — ABNORMAL LOW (ref 4.22–5.81)
RDW: 16.7 % — ABNORMAL HIGH (ref 11.5–15.5)
WBC: 5 10*3/uL (ref 4.0–10.5)
nRBC: 0 % (ref 0.0–0.2)

## 2019-05-09 LAB — MAGNESIUM: Magnesium: 2.7 mg/dL — ABNORMAL HIGH (ref 1.7–2.4)

## 2019-05-09 MED ORDER — SODIUM BICARBONATE 650 MG PO TABS
650.0000 mg | ORAL_TABLET | Freq: Three times a day (TID) | ORAL | Status: DC
  Administered 2019-05-09 – 2019-05-24 (×46): 650 mg via ORAL
  Filled 2019-05-09 (×46): qty 1

## 2019-05-09 MED ORDER — SODIUM CHLORIDE 0.9 % IV SOLN
510.0000 mg | Freq: Once | INTRAVENOUS | Status: AC
  Administered 2019-05-09: 510 mg via INTRAVENOUS
  Filled 2019-05-09: qty 17

## 2019-05-09 MED ORDER — SODIUM CHLORIDE 0.9 % IV SOLN: INTRAVENOUS | Status: DC

## 2019-05-09 MED ORDER — FLUOXETINE HCL 20 MG PO CAPS
20.0000 mg | ORAL_CAPSULE | Freq: Every day | ORAL | Status: DC
  Administered 2019-05-09 – 2019-05-10 (×2): 20 mg via ORAL
  Filled 2019-05-09 (×2): qty 1

## 2019-05-09 NOTE — Plan of Care (Signed)
  Problem: Activity: Goal: Activity intolerance will improve Outcome: Progressing   

## 2019-05-09 NOTE — Progress Notes (Signed)
Physical Therapy Treatment Patient Details Name: Jeremy Sherman MRN: [id number] DOB: 06/16/1950 Today's Date: 05/09/2019    History of Present Illness 69 year old male admitted to ED on 3/10 with AKI, AMS, R chest wall pain. Pt with foley catheter use x2 weeks. Pt with recent hospitalization for CVA and aneurysm repair, d/c from CIR on 04/21/2019. 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    PT Comments    Pt was seen for mobility of gait and LE strengthening, and note his mm's esp hamstrings and calves are quite tight from likely disuse.  Pt is motivated, and being encouraged by his sign other to work.  Follow up with him on further standing work to increase ROM on hips and strengthen hips to increase support of standing balance.    Follow Up Recommendations  Home health PT;Supervision for mobility/OOB     Equipment Recommendations  Hospital bed    Recommendations for Other Services       Precautions / Restrictions Precautions Precautions: Fall Precaution Comments: crying outbursts Restrictions Weight Bearing Restrictions: No    Mobility  Bed Mobility Overal bed mobility: Needs Assistance     Sidelying to sit: Min assist;HOB elevated       General bed mobility comments: up in chair when PT arrived  Transfers Overall transfer level: Needs assistance Equipment used: Rolling walker (2 wheeled);1 person hand held assist Transfers: Sit to/from Stand(stand to sit) Sit to Stand: Min assist;From elevated surface(mod assist to sit)         General transfer comment: reminders for hand placement  Ambulation/Gait Ambulation/Gait assistance: Min assist Gait Distance (Feet): 60 Feet Assistive device: Rolling walker (2 wheeled) Gait Pattern/deviations: Step-through pattern;Decreased stride length;Trunk flexed;Narrow base of  support Gait velocity: reduced Gait velocity interpretation: <1.31 ft/sec, indicative of household ambulator General Gait Details: reminders for safety in clearing obstacles and getting set up to sit   Stairs             Wheelchair Mobility    Modified Rankin (Stroke Patients Only)       Balance Overall balance assessment: Needs assistance Sitting-balance support: Feet supported Sitting balance-Leahy Scale: Fair     Standing balance support: Bilateral upper extremity supported;During functional activity Standing balance-Leahy Scale: Poor                              Cognition Arousal/Alertness: Awake/alert Behavior During Therapy: Impulsive Overall Cognitive Status: Impaired/Different from baseline Area of Impairment: Orientation;Attention;Memory;Following commands;Safety/judgement;Awareness;Problem solving                 Orientation Level: Time Current Attention Level: Selective Memory: Decreased recall of precautions Following Commands: Follows one step commands inconsistently;Follows one step commands with increased time Safety/Judgement: Decreased awareness of safety;Decreased awareness of deficits Awareness: Intellectual;Emergent Problem Solving: Slow processing;Decreased initiation;Requires verbal cues General Comments: pt was feeling more confident about his balance this PM      Exercises General Exercises - Lower Extremity Ankle Circles/Pumps: AROM;5 reps Long Arc Quad: Strengthening;10 reps Heel Slides: Strengthening;10 reps Hip ABduction/ADduction: Strengthening;10 reps Straight Leg Raises: Strengthening;10 reps    General Comments General comments (skin integrity, edema, etc.): pt is in chair with foley inplace, forgot about it and IV port in LUE.  Has extremely flaky dry skin on LE's      Pertinent Vitals/Pain Pain Assessment: Faces Faces Pain Scale: Hurts  little more Pain Location: R knee Pain Descriptors / Indicators:  Guarding;Grimacing Pain Intervention(s): Monitored during session;Repositioned    Home Living Family/patient expects to be discharged to:: Private residence Living Arrangements: Spouse/significant other Available Help at Discharge: Family;Available 24 hours/day Type of Home: House Home Access: Stairs to enter Entrance Stairs-Rails: None Home Layout: One level Home Equipment: Cane - single point;Walker - 2 wheels;Bedside commode Additional Comments: 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 Pt working on tolerance for ambulation with HHPT. Was receiving HHPT PTA. OT had not started PTA.  ADL's / Homemaking Assistance Needed: Per wife, pt required assist for dressing, bathing, repositioning in bed all throughout the night, toileting - pt is incontinent. Pt's wife cooks and cleans. Comments: Prior to CVA, pt completely independent   PT Goals (current goals can now be found in the care plan section) Acute Rehab PT Goals Patient Stated Goal: get home with wife Progress towards PT goals: Progressing toward goals    Frequency    Min 3X/week      PT Plan Current plan remains appropriate    Co-evaluation              AM-PAC PT "6 Clicks" Mobility   Outcome Measure  Help needed turning from your back to your side while in a flat bed without using bedrails?: A Little Help needed moving from lying on your back to sitting on the side of a flat bed without using bedrails?: A Lot Help needed moving to and from a bed to a chair (including a wheelchair)?: A Lot Help needed standing up from a chair using your arms (e.g., wheelchair or bedside chair)?: A Lot Help needed to walk in hospital room?: A Little Help needed climbing 3-5 steps with a railing? : A Lot 6 Click Score:  14    End of Session Equipment Utilized During Treatment: Gait belt Activity Tolerance: Patient limited by fatigue;Treatment limited secondary to medical complications (Comment) Patient left: in chair;with call bell/phone within reach;with chair alarm set;with family/visitor present Nurse Communication: Mobility status PT Visit Diagnosis: Other abnormalities of gait and mobility (R26.89);Muscle weakness (generalized) (M62.81)     Time: [id number] PT Time Calculation (min) (ACUTE ONLY): 38 min  Charges:                      Ramond Dial 05/09/2019, 4:09 PM  Mee Hives, PT MS Acute Rehab Dept. Number: [place]

## 2019-05-09 NOTE — Progress Notes (Signed)
PROGRESS NOTE  Jeremy Sherman [id number] DOB: 1950-09-23   PCP: Imagene Riches, NP  Patient is from: home. Uses walker with help recently.   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  Per family, has some confusion as well.  In ED, hemodynamically stable.  Creatinine 4.1 (baseline 0.9-1). Na 133.  K3.8.  BUN 43.  Hgb 9.3.  Bicarb 20.  AG 12.  Ammonia 43.  LA 1.9.  UA concerning for UTI.  CT head without acute finding.  CXR stable cardiomegaly.  Renal ultrasound without significant finding.  COVID-19 negative.  Patient was admitted for AKI, CAUTI and AME.  Started on IV fluid and ceftriaxone.   Nephrology consulted the next day.  Urine culture no growth to date.  Ceftriaxone discontinued.   Subjective: No major events overnight or this morning.  No complaints.  He denies pain, dyspnea or UTI symptoms but not a great historian.  He is oriented x4 except year but is slow to respond.  Has no insight into why he is in the hospital.  Objective: Vitals:   05/08/19 1701 05/08/19 2035 05/09/19 0445 05/09/19 0840  BP: (!) 154/72 131/72 125/70 (!) 150/66  Pulse: 66 68 60 63  Resp: 18 18 18 18   Temp: 98.5 F (36.9 C) 98.4 F (36.9 C) 98.9 F (37.2 C) 98.4 F (36.9 C)  TempSrc: Oral Oral Oral Oral  SpO2: 100% 100% 100% 100%  Weight:  99 kg      Intake/Output Summary (Last 24 hours) at 05/09/2019 1205 Last data filed at 05/09/2019 0900 Gross per 24 hour  Intake 2000.99 ml  Output 1050 ml  Net 950.99 ml   Filed Weights   05/08/19 2035  Weight: 99 kg    Examination:  GENERAL: No apparent distress.  Nontoxic. HEENT: MMM.  Vision and hearing grossly intact.  NECK: Supple.  No  apparent JVD.  RESP:  No IWOB. Good air movement bilaterally. CVS:  RRR. Heart sounds normal.  ABD/GI/GU: Bowel sounds present. Soft. Non tender.  Indwelling Foley in place MSK/EXT:  Moves extremities. No apparent deformity. No edema.  SKIN: Diffuse hyperpigmented skin patches on both lower extremities NEURO: Awake, alert and oriented x4 except year.  CN grossly intact.  Motor 5/5 in BUE and 4/5 in BLE.  Patellar reflex symmetric. PSYCH: Labile affect partly due to some cognitive impairment.  He got tearful when we talked about AKI.  Procedures:  None  Assessment & Plan: Acute kidney injury with azotemia: Baseline Cr 0.9-1.0> 4.1 (admit)> 4.72> 4.35.  BUN 43> 51> 47.  FENa 0.3% suggestive for prerenal cytology likely from poor p.o. intake.  Not nephrotoxic meds on his list other than topical Voltaren gel.  No GI symptoms.  Renal US without significant finding.  CK normal.  UA with pyuria and hematuria.  Overall, renal function seems to be improving.  About 1 L UOP charted. -Appreciate guidance and insight by nephrology-on IV fluid -Follow autoimmune labs -Avoid nephrotoxic meds  Pyuria/hematuria in patient with chronic indwelling Foley catheter due to urine retention/BPH: Initially thought to be CAUTI based on UA and encephalopathy which is now unlikely. No leukocytosis, fever, suprapubic or CVA tenderness.  Urine culture negative -Stop IV ceftriaxone  Acute  metabolic encephalopathy: Suspect some underlying cognitive impairment.  Multifactorial-dehydration, AKI with azotemia,  hyperammonia, low vitamin B12, and tramadol if he has been taking.  No focal neuro deficits other than BLE weakness.  TSH slightly high but markedly improved from prior.  He is oriented x4 except year but slow.  -Treat treatable causes-AKI, vitamin B12 deficiency and hyperammonia -Delirium precautions and frequent reorientation's  Normocytic anemia: IDA and ACD. Baseline Hgb 7-9> 9.3 (admit)>> 7.8.  Pattern suggests  dilution.  Iron sats 9%.  Vitamin B12 low at 142. -Gave IM B12 1000 mcg once.  Continue p.o. -IV iron and ESA per nephrology -Check FOBT to exclude GI bleed.  He is on Brilinta and Eliquis.  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  No focal neuro deficit other than BLE weakness.  He was discharged home from rehab on 2/25. -Continue Eliquis and Brilinta -Continue statin -PT/OT  History of CAD/CABG in 2009-no cardiopulmonary symptoms. -Continue Lipitor, Eliquis and Brilinta as above.  Hypothyroidism: TSH 6.5 (45 about 2 months ago) -Continue home Synthroid  Mild metabolic acidosis: Likely due to renal failure and IV fluid -On bicarb per nephrology.  Hyponatremia: Resolved.  Hyperphosphatemia: Likely due to renal failure. -Per nephrology  Debility generalized weakness -PT/OT  Chronic pain -Scheduled Tylenol 1 g 3 times daily -Avoid sedating medications  GERD -Continue PPI  Nutrition: poor by mouth intake and nausea -Dietitian consulted.                 DVT prophylaxis: On Eliquis Code Status: Full code Family Communication: Updated patient's wife over the phone.  Discharge barrier: AKI.  Needs further work-up and treatment.  He is on IV fluid. Patient is from: Home Final disposition: To be determined pending clinical improvement and clearance by consultants  Consultants: Nephrology   Microbiology summarized: COVID-19 negative Urine culture pending  Sch Meds:  Scheduled Meds: . acetaminophen  1,000 mg Oral Q8H  . apixaban  5 mg Oral BID  . atorvastatin  80 mg Oral q1800  . Chlorhexidine Gluconate Cloth  6 each Topical Daily  . feeding supplement (NEPRO CARB STEADY)  237 mL Oral BID BM  . influenza vaccine adjuvanted  0.5 mL Intramuscular Tomorrow-1000  . levothyroxine  100 mcg Oral Q0600  . pneumococcal 23 valent vaccine  0.5 mL Intramuscular Tomorrow-1000  .  sodium bicarbonate  650 mg Oral TID  . ticagrelor  90 mg Oral BID  . vitamin B-12  1,000 mcg Oral Daily   Continuous Infusions:  PRN Meds:.  Antimicrobials: Anti-infectives (From admission, onward)   Start     Dose/Rate Route Frequency Ordered Stop   05/08/19 0130  cefTRIAXone (ROCEPHIN) 1 g in sodium chloride 0.9 % 100 mL IVPB  Status:  Discontinued     1 g 200 mL/hr over 30 Minutes Intravenous Daily at bedtime 05/08/19 0100 05/09/19 0752       I have personally reviewed the following labs and images: CBC: Recent Labs  Lab 05/07/19 1831 05/09/19 0729  WBC 7.2 5.0  HGB 9.3* 7.8*  HCT 29.5* 24.5*  MCV 90.5 91.4  PLT 343 302   BMP &GFR Recent Labs  Lab 05/07/19 1831 05/08/19 0130 05/08/19 0230 05/08/19 1948 05/09/19 0729  NA 133* 131* 134* 135 136  K 3.8 5.8* 4.7 4.2 4.3  CL 102 100 102 107 108  CO2 20* 16* 20* 16* 18*  GLUCOSE 108* 131* 123* 120* 88  BUN 43* 47* 47* 51* 47*  CREATININE 4.10* 4.45* 4.59* 4.72* 4.35*  CALCIUM 8.3* 8.4* 8.5* 7.9* 7.7*  MG  --  2.9*  --   --  2.7*  PHOS  --   --   --  4.9* 4.9*   Estimated Creatinine Clearance: 19.9 mL/min (A) (by C-G formula based on SCr of 4.35 mg/dL (H)). Liver & Pancreas: Recent Labs  Lab 05/08/19 1948 05/09/19 0729  ALBUMIN 2.3* 2.2*   No results for input(s): LIPASE, AMYLASE in the last 168 hours. Recent Labs  Lab 05/08/19 0120  AMMONIA 43*   Diabetic: No results for input(s): HGBA1C in the last 72 hours. No results for input(s): GLUCAP in the last 168 hours. Cardiac Enzymes: Recent Labs  Lab 05/08/19 1948  CKTOTAL 78   No results for input(s): PROBNP in the last 8760 hours. Coagulation Profile: No results for input(s): INR, PROTIME in the last 168 hours. Thyroid Function Tests: Recent Labs    05/08/19 0855  TSH 6.549*   Lipid Profile: No results for input(s): CHOL, HDL, LDLCALC, TRIG, CHOLHDL, LDLDIRECT in the last 72 hours. Anemia Panel: Recent Labs    05/08/19 0855  05/08/19 1231  VITAMINB12 142*  --   FOLATE  --  28.7  FERRITIN  --  392*  TIBC  --  172*  IRON  --  15*  RETICCTPCT  --  0.8   Urine analysis:    Component Value Date/Time   COLORURINE YELLOW 05/07/2019 2352   APPEARANCEUR CLOUDY (A) 05/07/2019 2352   LABSPEC 1.012 05/07/2019 2352   PHURINE 6.0 05/07/2019 2352   GLUCOSEU NEGATIVE 05/07/2019 2352   HGBUR LARGE (A) 05/07/2019 2352   BILIRUBINUR NEGATIVE 05/07/2019 2352   Orient 05/07/2019 2352   PROTEINUR 100 (A) 05/07/2019 2352   NITRITE NEGATIVE 05/07/2019 2352   LEUKOCYTESUR LARGE (A) 05/07/2019 2352   Sepsis Labs: Invalid input(s): PROCALCITONIN, [place]  Microbiology: Recent Results (from the past 240 hour(s))  SARS CORONAVIRUS 2 (TAT 6-24 HRS) Nasopharyngeal Nasopharyngeal Swab     Status: None   Collection Time: 05/07/19  8:48 PM   Specimen: Nasopharyngeal Swab  Result Value Ref Range Status   SARS Coronavirus 2 NEGATIVE NEGATIVE Final    Comment: (NOTE) SARS-CoV-2 target nucleic acids are NOT DETECTED. The SARS-CoV-2 RNA is generally detectable in upper and lower respiratory specimens during the acute phase of infection. Negative results do not preclude SARS-CoV-2 infection, do not rule out co-infections with other pathogens, and should not be used as the sole basis for treatment or other patient management decisions. Negative results must be combined with clinical observations, patient history, and epidemiological information. The expected result is Negative. Fact Sheet for Patients: SugarRoll.be Fact Sheet for Healthcare Providers: https://www.woods-mathews.com/ This test is not yet approved or cleared by the [place] FDA and  has been authorized for detection and/or diagnosis of SARS-CoV-2 by FDA under an Emergency Use Authorization (EUA). This EUA will remain  in effect (meaning this test can be used) for the duration of the COVID-19  declaration under Section 56 4(b)(1) of the Act, 21 U.S.C. section 360bbb-3(b)(1), unless the authorization is terminated or revoked sooner. Performed at [facility], [place] [address], [place], [place] [address]   Urine culture     Status: None   Collection Time: 05/07/19 11:45 PM   Specimen: Urine, Random  Result Value Ref Range Status   Specimen Description URINE, RANDOM  Final   Special Requests NONE  Final  Culture   Final    NO GROWTH Performed at [facility], [place] [address], [place], [place] [address]    Report Status 05/08/2019 FINAL  Final  Culture, blood (routine x 2)     Status: None (Preliminary result)   Collection Time: 05/08/19  1:20 AM   Specimen: BLOOD  Result Value Ref Range Status   Specimen Description BLOOD RIGHT ARM  Final   Special Requests   Final    BOTTLES DRAWN AEROBIC AND ANAEROBIC Blood Culture adequate volume   Culture   Final    NO GROWTH 1 DAY Performed at [facility], [place] [address], [place], [place] [address]    Report Status PENDING  Incomplete  Culture, blood (routine x 2)     Status: None (Preliminary result)   Collection Time: 05/08/19  1:20 AM   Specimen: BLOOD  Result Value Ref Range Status   Specimen Description BLOOD LEFT ARM  Final   Special Requests   Final    BOTTLES DRAWN AEROBIC AND ANAEROBIC Blood Culture adequate volume   Culture   Final    NO GROWTH 1 DAY Performed at [facility], [place] [address] [place], [place] [address]    Report Status PENDING  Incomplete    Radiology Studies: No results found.   Collen Vincent T. [place]  If 7PM-7AM, please contact night-coverage [email or web address] Password [facility] 05/09/2019, 12:05 PM

## 2019-05-09 NOTE — Progress Notes (Signed)
Occupational Therapy Evaluation Patient Details Name: Jeremy Sherman MRN: [id number] DOB: Mar 31, 1950 Today's Date: 05/09/2019    History of Present Illness 69 year old male admitted to ED on 3/10 with AKI, AMS, R chest wall pain. Pt with foley catheter use x2 weeks. Pt with recent hospitalization for CVA and aneurysm repair, d/c from CIR on 04/21/2019. 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   Clinical Impression   PTA, family was assisting with mobility and ADL @ RW level. Pt had just started working with HHPT(OT had not started yet) after DC form CIR. Pt currently requires min A @ RW level with limited mobility and Mod to Max A with ADL tasks.  Pt very labile during session and states he doesn't know why he cries all of the time and hates that he "can't control it". Wife states this has only happened since his CVA. 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   Wife states it is very difficult to get her husband out of bed at home. Feel pt/caregiver would benefit from hospital bed to reduce risk of falls and decrease caregiver burden. Will follow acutely to facilitate safe DC home.   Follow Up Recommendations  Home health OT;Supervision/Assistance - 24 hour    Equipment Recommendations  Other (comment)(hospital bed)    Recommendations for Other Services       Precautions / Restrictions Precautions Precautions: Fall Precaution Comments: frequent outbursts of uncontrollable crying      Mobility Bed Mobility Overal bed mobility: Needs Assistance     Sidelying to sit: Min assist;HOB elevated       General bed mobility comments: heavy use of rails; would benefit form hospital bed  Transfers Overall transfer level: Needs assistance   Transfers: Sit to/from Stand Sit to Stand: Min assist;From elevated  surface         General transfer comment: VC for correct hand placement; frequent redirection to task    Balance                                           ADL either performed or assessed with clinical judgement   ADL Overall ADL's : Needs assistance/impaired Eating/Feeding: Set up;Sitting   Grooming: Set up;Supervision/safety;Sitting   Upper Body Bathing: Set up;Supervision/ safety;Sitting   Lower Body Bathing: Moderate assistance;Sit to/from stand;Sitting/lateral leans   Upper Body Dressing : Minimal assistance;Sitting   Lower Body Dressing: Moderate assistance;Sit to/from stand;Sitting/lateral leans   Toilet Transfer: Minimal assistance;Stand-pivot;RW   Toileting- Clothing Manipulation and Hygiene: Maximal assistance Toileting - Clothing Manipulation Details (indicate cue type and reason): incontinenet     Functional mobility during ADLs: Minimal assistance;Rolling walker;Cueing for safety;Cueing for sequencing General ADL Comments: wife states pt requires increased assistance for ADL. Educated wife on need to have pt to do more of his ADL tasks, using verbal cues instead of physically doing tasks for him. Also educated wife on imortance of minimizing distractions during self - care.      Vision Baseline Vision/History: Wears glasses Wears Glasses: Reading only Additional Comments: decreased visual attention. will further address     Perception     Praxis Praxis Praxis tested?: Deficits Praxis-Other Comments: note difficulty with initiation at times    Pertinent Vitals/Pain Pain Assessment: Faces Faces Pain Scale: Hurts little  more Pain Location: B feet Pain Descriptors / Indicators: Aching;Grimacing;Guarding;Moaning Pain Intervention(s): Limited activity within patient's tolerance     Hand Dominance Right   Extremity/Trunk Assessment Upper Extremity Assessment Upper Extremity Assessment: RUE deficits/detail RUE Deficits / Details:  incoordination deficits from previous cerebellar CVA RUE Coordination: decreased fine motor   Lower Extremity Assessment Lower Extremity Assessment: Defer to PT evaluation   Cervical / Trunk Assessment Cervical / Trunk Assessment: Normal   Communication     Cognition Arousal/Alertness: Awake/alert Behavior During Therapy: (Labile) Overall Cognitive Status: Impaired/Different from baseline Area of Impairment: Orientation;Attention;Memory;Following commands;Safety/judgement;Awareness;Problem solving                 Orientation Level: Disoriented to;Time Current Attention Level: Sustained Memory: Decreased short-term memory Following Commands: Follows one step commands consistently Safety/Judgement: Decreased awareness of safety;Decreased awareness of deficits Awareness: Emergent Problem Solving: Slow processing;Difficulty sequencing;Decreased initiation     General Comments       Exercises     Shoulder Instructions      Home Living Family/patient expects to be discharged to:: Private residence Living Arrangements: Spouse/significant other Available Help at Discharge: Family;Available 24 hours/day Type of Home: House Home Access: Stairs to enter [employer] of Steps: 4 in back, 2 in front Entrance Stairs-Rails: None Home Layout: One level     Bathroom Shower/Tub: Teacher, early years/pre: Standard Bathroom Accessibility: Yes How Accessible: Accessible via walker Home Equipment: Cane - single point;Walker - 2 wheels;Bedside commode   Additional Comments: worked full time as a Glass blower/designer for a company for [employer]  Lives With: Spouse;Daughter    Prior Functioning/Environment Level of Independence: Needs assistance  Gait / Transfers Assistance Needed: prior to this hospitalization, pt and pt's wife report limited household ambulation with RW. Pt working on tolerance for ambulation with HHPT. Was receiving HHPT PTA. OT had not started  PTA.  ADL's / Homemaking Assistance Needed: Per wife, pt required assist for dressing, bathing, repositioning in bed all throughout the night, toileting - pt is incontinent. Pt's wife cooks and cleans.   Comments: Prior to CVA, pt completely independent        OT Problem List: Decreased strength;Decreased activity tolerance;Impaired balance (sitting and/or standing);Decreased coordination;Decreased cognition;Decreased safety awareness;Decreased knowledge of use of DME or AE;Obesity;Pain;Impaired UE functional use      OT Treatment/Interventions: Self-care/ADL training;Therapeutic exercise;Neuromuscular education;DME and/or AE instruction;Therapeutic activities;Cognitive remediation/compensation;Patient/family education;Balance training    OT Goals(Current goals can be found in the care plan section) Acute Rehab OT Goals Patient Stated Goal: to stop crying all of the time OT Goal Formulation: With patient/family Time For Goal Achievement: 05/23/19 Potential to Achieve Goals: Good  OT Frequency: Min 3X/week   Barriers to D/C:            Co-evaluation              AM-PAC OT "6 Clicks" Daily Activity     Outcome Measure Help from another person eating meals?: None Help from another person taking care of personal grooming?: A Little Help from another person toileting, which includes using toliet, bedpan, or urinal?: A Lot Help from another person bathing (including washing, rinsing, drying)?: A Lot Help from another person to put on and taking off regular upper body clothing?: A Little Help from another person to put on and taking off regular lower body clothing?: A Lot 6 Click Score: 16   End of Session Equipment Utilized During Treatment: Gait belt;Rolling walker Nurse Communication: Mobility status  Activity  Tolerance: Patient tolerated treatment well Patient left: in chair;with call bell/phone within reach;with chair alarm set;with family/visitor present  OT Visit  Diagnosis: Unsteadiness on feet (R26.81);Other abnormalities of gait and mobility (R26.89);Muscle weakness (generalized) (M62.81);Ataxia, unspecified (R27.0);Other symptoms and signs involving cognitive function;Pain Pain - part of body: Leg(B)                Time: [id number] OT Time Calculation (min): 28 min Charges:  OT General Charges $OT Visit: 1 Visit OT Evaluation $OT Eval Moderate Complexity: 1 Mod OT Treatments $Self Care/Home Management : 8-22 mins  Maurie Boettcher, OT/L   Acute OT Clinical Specialist [place] Pager [phone] Office [phone]   [facility] 05/09/2019, 1:59 PM

## 2019-05-09 NOTE — Progress Notes (Signed)
Initial Nutrition Assessment  DOCUMENTATION CODES:   Not applicable  INTERVENTION:   Recommend liberalizing diet to 2g sodium restriction only at this time due to poor po intake, electrolytes acceptable, fluid restriction not indicated, not checking CBGs (serum glucose wdl this AM), not on DM meds  Continue Nepro Shake po BID, each supplement provides 425 kcal and 19 grams protein  Add Magic cup BID with meals, each supplement provides 290 kcal and 9 grams of protein   Continue B-12 supplementation for deficiency   NUTRITION DIAGNOSIS:   Inadequate oral intake related to acute illness, poor appetite as evidenced by meal completion < 25%.  GOAL:   Patient will meet greater than or equal to 90% of their needs  MONITOR:   PO intake, Supplement acceptance, Labs, Weight trends  REASON FOR ASSESSMENT:   Consult Assessment of nutrition requirement/status  ASSESSMENT:   69 yo male admitted with AKI with metabolic acidosis, acute metabolic encephalopathy. PMH includes CAD s/p CABG,HTN, pre-DM, CVA, HLD,current tobacco user/smoker   RD working remotely.  Pt ate 25% at dinner last night, 25% at breakfast this AM. Appetite is poor  Mild hyperkalemia on admission, resolved.   Per nephrology, pt appears dry on exam. Receiving IV fluids. UOP 900 mL in previous 24 hours  Noted serum B-12 142 (L), currently ordered for 1000 mcg po daily  Current weight 99 kg; weight of 102 kg on 3/05, weight of 100.5 kg in Feb.   Pt needs nutrition-focused physical exam on follow-up  Labs: phosphorus 4.9, Creatinine 4.72, BUN 51, potassium wdl Meds: sodium bicarb tablets, Vit B-12  Diet Order:   Diet Order            Diet 2 gram sodium Room service appropriate? Yes; Fluid consistency: Thin  Diet effective now              EDUCATION NEEDS:   Not appropriate for education at this time  Skin:  Skin Assessment: Skin Integrity Issues: Skin Integrity Issues:: Other (Comment) Other:  non-pressure wound on buttock  Last BM:  3/10  Height:   Ht Readings from Last 1 Encounters:  05/02/19 6\' 5"  (1.956 m)    Weight:   Wt Readings from Last 1 Encounters:  05/08/19 99 kg    BMI:  Body mass index is 25.88 kg/m.  Estimated Nutritional Needs:   Kcal:  2400-2700 kcals  Protein:  120-135 g  Fluid:  >/= 2 L   Kerman Passey MS, RDN, LDN, CNSC RD Pager Number and Weekend/On-Call After Hours Pager Located in [place]

## 2019-05-09 NOTE — Progress Notes (Signed)
Subjective:  900 UOP - crt dec a little Objective Vital signs in last 24 hours: Vitals:   05/08/19 1701 05/08/19 2035 05/09/19 0445 05/09/19 0840  BP: (!) 154/72 131/72 125/70 (!) 150/66  Pulse: 66 68 60 63  Resp: 18 18 18 18   Temp: 98.5 F (36.9 C) 98.4 F (36.9 C) 98.9 F (37.2 C) 98.4 F (36.9 C)  TempSrc: Oral Oral Oral Oral  SpO2: 100% 100% 100% 100%  Weight:  99 kg     Weight change:   Intake/Output Summary (Last 24 hours) at 05/09/2019 1428 Last data filed at 05/09/2019 0900 Gross per 24 hour  Intake 2000.99 ml  Output 750 ml  Net 1250.99 ml    Assessment/ Plan: Pt is a 69 y.o. yo male with HTN, CAD who was admitted on 05/07/2019 with AKI (crt 1.08 on 04/21/19)  Assessment/Plan: 1. Renal-  True AKI from normal crt on 04/21/19.  Left hospital with indwelling foley-  Has stayed in place but changed out per home health on Monday.  Many new meds per cardiology including brilinta and a statin.  U/A showing 100 of prot, white and red blood cells- possible UTI but no growth on culture.  Renal u/s normal with 12 cm kidneys.  Is non oliguric. BP has not been low.  CK 78.  ANA negative, complements and ANCA pending. Protein to crt ratio low. At a bit of a loss as to what has caused his AKI.   Right now non oliguric and crt trended better some from last night so no change in plan  - await further labs and trend of kidney function  2. HTN/volume-  Appeared dry if anything-  Had not been eating well-  Getting IVF but now stopped  3. Anemia-  Significant-  Low iron stores-  Will replete 4. Metabolic acidosis - on bicarb     Jeremy Sherman Felicity Coyer    Labs: Basic Metabolic Panel: Recent Labs  Lab 05/08/19 0230 05/08/19 1948 05/09/19 0729  NA 134* 135 136  K 4.7 4.2 4.3  CL 102 107 108  CO2 20* 16* 18*  GLUCOSE 123* 120* 88  BUN 47* 51* 47*  CREATININE 4.59* 4.72* 4.35*  CALCIUM 8.5* 7.9* 7.7*  PHOS  --  4.9* 4.9*   Liver Function Tests: Recent Labs  Lab 05/08/19 1948  05/09/19 0729  ALBUMIN 2.3* 2.2*   No results for input(s): LIPASE, AMYLASE in the last 168 hours. Recent Labs  Lab 05/08/19 0120  AMMONIA 43*   CBC: Recent Labs  Lab 05/07/19 1831 05/09/19 0729  WBC 7.2 5.0  HGB 9.3* 7.8*  HCT 29.5* 24.5*  MCV 90.5 91.4  PLT 343 302   Cardiac Enzymes: Recent Labs  Lab 05/08/19 1948  CKTOTAL 78   CBG: No results for input(s): GLUCAP in the last 168 hours.  Iron Studies:  Recent Labs    05/08/19 1231  IRON 15*  TIBC 172*  FERRITIN 392*   Studies/Results: CT HEAD WO CONTRAST  Result Date: 05/08/2019 CLINICAL DATA:  69 year old male with encephalopathy. EXAM: CT HEAD WITHOUT CONTRAST TECHNIQUE: Contiguous axial images were obtained from the base of the skull through the vertex without intravenous contrast. COMPARISON:  Brain MRI 03/18/2019. CTA head and neck 03/26/2019. FINDINGS: Brain: Mild streak artifact associated with a distal ACA region aneurysm coil pack. No midline shift, ventriculomegaly, mass effect, evidence of mass lesion, intracranial hemorrhage or evidence of cortically based acute infarction. Gray-white matter differentiation is within normal limits throughout the brain.  Mild for age white matter hypodensity, most pronounced in the right cerebellum. Vascular: No suspicious intracranial vascular hyperdensity. Skull: No acute osseous abnormality identified. Sinuses/Orbits: The visible paranasal sinuses have cleared since January. Tympanic cavities and mastoids remain well pneumatized. Other: No acute orbit or scalp soft tissue finding. IMPRESSION: 1. No acute intracranial abnormality. 2. Left MCA M1 and distal ACA stents with pericallosal region coil pack redemonstrated. Electronically Signed   By: Genevie Ann M.D.   On: 05/08/2019 02:30   US RENAL  Result Date: 05/08/2019 CLINICAL DATA:  Acute kidney injury EXAM: RENAL / URINARY TRACT ULTRASOUND COMPLETE COMPARISON:  March 24, 2019 FINDINGS: Right Kidney: Renal measurements: 12.5  x 5.7 x 4.2 cm = volume: 156 mL . Echogenicity within normal limits. No mass or hydronephrosis visualized. Left Kidney: Renal measurements: 12.3 x 6.1 x 4.6 cm = volume: 180 mL. Echogenicity within normal limits. No mass or hydronephrosis visualized. Bladder: The bladder is decompressed with a Foley catheter. Other: None. IMPRESSION: No acute abnormality.  No hydronephrosis. Electronically Signed   By: Constance Holster M.D.   On: 05/08/2019 02:49   DG Chest Port 1 View  Result Date: 05/08/2019 CLINICAL DATA:  69 year old male with pain. EXAM: PORTABLE CHEST 1 VIEW COMPARISON:  Chest radiographs 04/02/2019 and earlier. FINDINGS: Portable AP semi upright view at 2349 hours. Right PICC line has been removed since last month. Stable cardiomegaly and mediastinal contours. Lung volumes are at the upper limits of normal. Allowing for portable technique the lungs are clear. Visualized tracheal air column is within normal limits. No pneumothorax. Prior sternotomy. IMPRESSION: Stable cardiomegaly. No acute cardiopulmonary abnormality. Electronically Signed   By: Genevie Ann M.D.   On: 05/08/2019 00:15   Medications: Infusions:   Scheduled Medications: . acetaminophen  1,000 mg Oral Q8H  . apixaban  5 mg Oral BID  . atorvastatin  80 mg Oral q1800  . Chlorhexidine Gluconate Cloth  6 each Topical Daily  . feeding supplement (NEPRO CARB STEADY)  237 mL Oral BID BM  . FLUoxetine  20 mg Oral Daily  . influenza vaccine adjuvanted  0.5 mL Intramuscular Tomorrow-1000  . levothyroxine  100 mcg Oral Q0600  . pneumococcal 23 valent vaccine  0.5 mL Intramuscular Tomorrow-1000  . sodium bicarbonate  650 mg Oral TID  . ticagrelor  90 mg Oral BID  . vitamin B-12  1,000 mcg Oral Daily    have reviewed scheduled and prn medications.  Physical Exam: General: sitting in bedside chair-  Distracted-  Wife here Heart: RRR Lungs: mostly clear Abdomen: soft, non tender-- indwelling foley  Extremities: maybe trace  edema    05/09/2019,2:28 PM  LOS: 1 day

## 2019-05-10 DIAGNOSIS — R4586 Emotional lability: Secondary | ICD-10-CM

## 2019-05-10 DIAGNOSIS — R110 Nausea: Secondary | ICD-10-CM

## 2019-05-10 LAB — CBC
HCT: 24.9 % — ABNORMAL LOW (ref 39.0–52.0)
Hemoglobin: 7.8 g/dL — ABNORMAL LOW (ref 13.0–17.0)
MCH: 28.3 pg (ref 26.0–34.0)
MCHC: 31.3 g/dL (ref 30.0–36.0)
MCV: 90.2 fL (ref 80.0–100.0)
Platelets: 327 10*3/uL (ref 150–400)
RBC: 2.76 MIL/uL — ABNORMAL LOW (ref 4.22–5.81)
RDW: 16.6 % — ABNORMAL HIGH (ref 11.5–15.5)
WBC: 5.7 10*3/uL (ref 4.0–10.5)
nRBC: 0 % (ref 0.0–0.2)

## 2019-05-10 LAB — RENAL FUNCTION PANEL
Albumin: 2.2 g/dL — ABNORMAL LOW (ref 3.5–5.0)
Anion gap: 10 (ref 5–15)
BUN: 46 mg/dL — ABNORMAL HIGH (ref 8–23)
CO2: 20 mmol/L — ABNORMAL LOW (ref 22–32)
Calcium: 7.9 mg/dL — ABNORMAL LOW (ref 8.9–10.3)
Chloride: 108 mmol/L (ref 98–111)
Creatinine, Ser: 4.27 mg/dL — ABNORMAL HIGH (ref 0.61–1.24)
GFR calc Af Amer: 15 mL/min — ABNORMAL LOW (ref >60)
GFR calc non Af Amer: 13 mL/min — ABNORMAL LOW (ref >60)
Glucose, Bld: 89 mg/dL (ref 70–99)
Phosphorus: 5 mg/dL — ABNORMAL HIGH (ref 2.5–4.6)
Potassium: 4 mmol/L (ref 3.5–5.1)
Sodium: 138 mmol/L (ref 135–145)

## 2019-05-10 LAB — MAGNESIUM: Magnesium: 2.8 mg/dL — ABNORMAL HIGH (ref 1.7–2.4)

## 2019-05-10 LAB — C4 COMPLEMENT: Complement C4, Body Fluid: 38 mg/dL (ref 12–38)

## 2019-05-10 LAB — C3 COMPLEMENT: C3 Complement: 133 mg/dL (ref 82–167)

## 2019-05-10 MED ORDER — PROMETHAZINE HCL 25 MG/ML IJ SOLN
12.5000 mg | Freq: Three times a day (TID) | INTRAMUSCULAR | Status: DC | PRN
  Administered 2019-05-10 – 2019-05-13 (×4): 12.5 mg via INTRAVENOUS
  Filled 2019-05-10 (×4): qty 1

## 2019-05-10 MED ORDER — SODIUM CHLORIDE 0.9 % IV BOLUS
500.0000 mL | Freq: Once | INTRAVENOUS | Status: AC
  Administered 2019-05-10: 500 mL via INTRAVENOUS

## 2019-05-10 MED ORDER — ONDANSETRON HCL 4 MG/2ML IJ SOLN
4.0000 mg | Freq: Three times a day (TID) | INTRAMUSCULAR | Status: DC | PRN
  Administered 2019-05-10 – 2019-05-14 (×3): 4 mg via INTRAVENOUS
  Filled 2019-05-10 (×3): qty 2

## 2019-05-10 NOTE — Progress Notes (Signed)
Patient continues to vomit after Zofran given. MD made aware. Orders placed and followed. Will continue to monitor.

## 2019-05-10 NOTE — Progress Notes (Signed)
Subjective:  1025 UOP - crt dec a little more- pt remains confused  Objective Vital signs in last 24 hours: Vitals:   05/09/19 1653 05/09/19 2100 05/10/19 0406 05/10/19 0900  BP: (!) 142/68 (!) 144/71 (!) 156/65 (!) 148/68  Pulse: 68 67 (!) 59 62  Resp: 18 18 17 18   Temp: 99.2 F (37.3 C) 98.7 F (37.1 C) 98 F (36.7 C) 98.2 F (36.8 C)  TempSrc: Oral Oral Oral Oral  SpO2: 100% 100% 100% 100%  Weight:       Weight change:   Intake/Output Summary (Last 24 hours) at 05/10/2019 1050 Last data filed at 05/10/2019 0900 Gross per 24 hour  Intake 1580.51 ml  Output 1076 ml  Net 504.51 ml    Assessment/ Plan: Pt is a 69 y.o. yo male with HTN, CAD who was admitted on 05/07/2019 with AKI (crt 1.08 on 04/21/19)  Assessment/Plan: 1. Renal-  True AKI from normal crt on 04/21/19.  Left hospital with indwelling foley-  Has stayed in place and changed out per home health on Monday.  Many new meds per cardiology including brilinta and a statin.  U/A showing 100 of prot, white and red blood cells- possible UTI but no growth on culture.  Renal u/s normal with 12 cm kidneys.  Is non oliguric. BP has not been low.  CK 78.  ANA negative, complements WNLand ANCA pending. Protein to crt ratio low. At a bit of a loss as to what has caused his AKI.   Right now non oliguric and crt trended better overall but very slow to improve  - await further labs and trend of kidney function.  No indication for dialysis  2. HTN/volume-  Appeared dry if anything-  Had not been eating well-  Was getting IVF -  Now BP is up will stop 3. Anemia-  Significant-  Low iron stores-  First feraheme dose 3/12 4. Metabolic acidosis - on oral bicarb - improving     Jeremy Sherman    Labs: Basic Metabolic Panel: Recent Labs  Lab 05/08/19 1948 05/09/19 0729 05/10/19 0512  NA 135 136 138  K 4.2 4.3 4.0  CL 107 108 108  CO2 16* 18* 20*  GLUCOSE 120* 88 89  BUN 51* 47* 46*  CREATININE 4.72* 4.35* 4.27*  CALCIUM 7.9*  7.7* 7.9*  PHOS 4.9* 4.9* 5.0*   Liver Function Tests: Recent Labs  Lab 05/08/19 1948 05/09/19 0729 05/10/19 0512  ALBUMIN 2.3* 2.2* 2.2*   No results for input(s): LIPASE, AMYLASE in the last 168 hours. Recent Labs  Lab 05/08/19 0120  AMMONIA 43*   CBC: Recent Labs  Lab 05/07/19 1831 05/09/19 0729 05/10/19 0512  WBC 7.2 5.0 5.7  HGB 9.3* 7.8* 7.8*  HCT 29.5* 24.5* 24.9*  MCV 90.5 91.4 90.2  PLT 343 302 327   Cardiac Enzymes: Recent Labs  Lab 05/08/19 1948  CKTOTAL 78   CBG: No results for input(s): GLUCAP in the last 168 hours.  Iron Studies:  Recent Labs    05/08/19 1231  IRON 15*  TIBC 172*  FERRITIN 392*   Studies/Results: No results found. Medications: Infusions: . sodium chloride 100 mL/hr at 05/10/19 1039    Scheduled Medications: . acetaminophen  1,000 mg Oral Q8H  . apixaban  5 mg Oral BID  . atorvastatin  80 mg Oral q1800  . Chlorhexidine Gluconate Cloth  6 each Topical Daily  . feeding supplement (NEPRO CARB STEADY)  237 mL Oral BID BM  .  FLUoxetine  20 mg Oral Daily  . influenza vaccine adjuvanted  0.5 mL Intramuscular Tomorrow-1000  . levothyroxine  100 mcg Oral Q0600  . pneumococcal 23 valent vaccine  0.5 mL Intramuscular Tomorrow-1000  . sodium bicarbonate  650 mg Oral TID  . ticagrelor  90 mg Oral BID  . vitamin B-12  1,000 mcg Oral Daily    have reviewed scheduled and prn medications.  Physical Exam: General: in-  Distracted-  Says he thinks he is going to throw up-  Confused-  Unclear where is baseline Heart: RRR Lungs: mostly clear Abdomen: soft, non tender-- indwelling foley  Extremities: maybe trace edema    05/10/2019,10:50 AM  LOS: 2 days

## 2019-05-10 NOTE — Progress Notes (Signed)
PROGRESS NOTE  Jeremy Sherman [id number] DOB: 1950-11-19   PCP: Imagene Riches, NP  Patient is from: home. Uses walker with help recently.   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  Per family, has some confusion as well.  In ED, hemodynamically stable.  Creatinine 4.1 (baseline 0.9-1). Na 133.  K3.8.  BUN 43.  Hgb 9.3.  Bicarb 20.  AG 12.  Ammonia 43.  LA 1.9.  UA concerning for UTI.  CT head without acute finding.  CXR stable cardiomegaly.  Renal ultrasound without significant finding.  COVID-19 negative.  Patient was admitted for AKI, CAUTI and AME.  Started on IV fluid and ceftriaxone.   Nephrology consulted the next day.  Urine culture no growth to date.  Ceftriaxone discontinued.   Subjective: No major events overnight or this morning.  Feels nauseous this morning.  He was a started on Prozac for labile mood yesterday.  Reports hurting all over.  Denies chest pain, dyspnea or abdominal pain.  He is fairly oriented but not a great historian.  Objective: Vitals:   05/09/19 2100 05/10/19 0406 05/10/19 0900 05/10/19 1331  BP: (!) 144/71 (!) 156/65 (!) 148/68 (!) 148/68  Pulse: 67 (!) 59 62 62  Resp: 18 17 18 18   Temp: 98.7 F (37.1 C) 98 F (36.7 C) 98.2 F (36.8 C) 98.2 F (36.8 C)  TempSrc: Oral Oral Oral Oral  SpO2: 100% 100% 100%   Weight:    99 kg  Height:    6\' 5"  (1.956 m)    Intake/Output Summary (Last 24 hours) at 05/10/2019 1456 Last data filed at 05/10/2019 1405 Gross per 24 hour  Intake 2368.2 ml  Output 1576 ml  Net 792.2 ml   Filed Weights   05/08/19 2035 05/10/19 1331  Weight: 99 kg 99 kg    Examination:  GENERAL: Some distress due to nausea.   Nontoxic. HEENT: MMM.  Vision and hearing grossly intact.  NECK: Supple.  No apparent JVD.  RESP:  No IWOB. Good air movement bilaterally. CVS:  RRR. Heart sounds normal.  ABD/GI/GU: Bowel sounds present. Soft. Non tender.  MSK/EXT:  Moves extremities. No apparent deformity. No edema.  SKIN: Diffuse hyperpigmented skin patches on both lower extremities NEURO: Awake, alert and oriented fairly but slow.  CN grossly intact.  Motor 5/5 in BUE and 4/5 in BLE PSYCH: Labile affect.  Some distress due to nausea.  Procedures:  None  Assessment & Plan: Acute kidney injury with azotemia: Baseline Cr 0.9-1.0> 4.1 (admit)> 4.72> 4.35> 4.27.  BUN 43> 51> 47> 46.  FENa 0.3% suggestive for prerenal cytology likely from poor p.o. intake.  Not nephrotoxic meds on his list other than topical Voltaren gel.  No GI symptoms.  Renal US, UA, PCR, CK and basic autoimmune labs not revealing.  ANCA pending.  Renal function slowly improving.  -Appreciate guidance and insight by nephrology-discontinued IVF due to blood pressure -Follow ANCA -Avoid nephrotoxic meds  Nausea: Could be due to Prozac which was started for labile mood yesterday.  Doubt uremia. -Discontinue Prozac. -As needed Zofran and Phenergan  Pyuria/hematuria in patient with chronic indwelling Foley catheter due to urine retention/BPH: Initially thought to be CAUTI based on UA and  encephalopathy which is now unlikely. No leukocytosis, fever, suprapubic or CVA tenderness.  Urine culture negative -Stopped IV ceftriaxone.  Acute metabolic encephalopathy: Suspect some underlying cognitive impairment.  Multifactorial-dehydration, AKI with azotemia,  hyperammonia, low vitamin B12, and tramadol if he has been taking.  No focal neuro deficits other than BLE weakness.  TSH slightly high but markedly improved from prior.  He is oriented x4 except year but slow.  -Treat treatable causes-AKI, vitamin B12 deficiency and hyperammonia -Delirium precautions and  frequent reorientation's -Started Prozac for labile mood but discontinued due to nausea.   Normocytic anemia: IDA and ACD. Baseline Hgb 7-9> 9.3 (admit)>> 7.8> 7.8.  Pattern suggests dilution from IV fluid.  Iron sats 9%.  Vitamin B12 low at 142. -Gave IM B12 1000 mcg once.  Continue p.o. -IV iron and ESA per nephrology -Check FOBT to exclude GI bleed.  He is on Brilinta and Eliquis.  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  No focal neuro deficit other than BLE weakness.  He was discharged home from rehab on 2/25. -Continue Eliquis and Brilinta -Continue statin -PT/OT  History of CAD/CABG in 2009-no cardiopulmonary symptoms. -Continue Lipitor, Eliquis and Brilinta as above.  Elevated BP: SBP in 140s.  Not on antihypertensive medications. -IVF discontinued  Hypothyroidism: TSH 6.5 (45 about 2 months ago) -Continue home Synthroid  Mild metabolic acidosis: Likely due to renal failure and IV fluid.  Improving -On bicarb per nephrology.  Hyponatremia: Resolved.  Hyperphosphatemia: Likely due to renal failure. -Per nephrology  Debility generalized weakness -PT/OT  Chronic pain -Scheduled Tylenol 1 g 3 times daily -Avoid sedating medications  GERD -Continue PPI  Nutrition: poor by mouth intake and nausea -Dietitian consulted.        Nutrition Problem: Inadequate oral intake Etiology: acute illness, poor appetite  Signs/Symptoms: meal completion < 25%  Interventions: Magic cup, Refer to RD note for recommendations, Liberalize Diet, Nepro shake   DVT prophylaxis: On Eliquis Code Status: Full code Family Communication: Attempted to call patient's wife for update but no answer.  Discharge barrier: Evaluation and treatment of AKI.  Patient is from: Home Final disposition: To be determined pending clinical improvement and clearance by nephrology  Consultants:  Nephrology   Microbiology summarized: [id number] negative Urine culture NGTD  Sch Meds:  Scheduled Meds: . acetaminophen  1,000 mg Oral Q8H  . apixaban  5 mg Oral BID  . atorvastatin  80 mg Oral q1800  . Chlorhexidine Gluconate Cloth  6 each Topical Daily  . feeding supplement (NEPRO CARB STEADY)  237 mL Oral BID BM  . influenza vaccine adjuvanted  0.5 mL Intramuscular Tomorrow-1000  . levothyroxine  100 mcg Oral Q0600  . pneumococcal 23 valent vaccine  0.5 mL Intramuscular Tomorrow-1000  . sodium bicarbonate  650 mg Oral TID  . ticagrelor  90 mg Oral BID  . vitamin B-12  1,000 mcg Oral Daily   Continuous Infusions:  PRN Meds:.  Antimicrobials: Anti-infectives (From admission, onward)   Start     Dose/Rate Route Frequency Ordered Stop   05/08/19 0130  cefTRIAXone (ROCEPHIN) 1 g in sodium chloride 0.9 % 100 mL IVPB  Status:  Discontinued     1 g 200 mL/hr over 30 Minutes Intravenous Daily at bedtime 05/08/19 0100 05/09/19 0752       I have personally reviewed the following labs and images: CBC: Recent Labs  Lab 05/07/19 1831 05/09/19 0729 05/10/19 [id number]  WBC 7.2 5.0 5.7  HGB 9.3* 7.8* 7.8*  HCT 29.5* 24.5* 24.9*  MCV 90.5 91.4 90.2  PLT 343 302 327   BMP &GFR Recent Labs  Lab 05/08/19 0130 05/08/19 0230 05/08/19 1948 05/09/19 0729 05/10/19 0512  NA 131* 134* 135 136 138  K 5.8* 4.7 4.2 4.3 4.0  CL 100 102 107 108 108  CO2 16* 20* 16* 18* 20*  GLUCOSE 131* 123* 120* 88 89  BUN 47* 47* 51* 47* 46*  CREATININE 4.45* 4.59* 4.72* 4.35* 4.27*  CALCIUM 8.4* 8.5* 7.9* 7.7* 7.9*  MG 2.9*  --   --  2.7* 2.8*  PHOS  --   --  4.9* 4.9* 5.0*   Estimated Creatinine Clearance: 20.3 mL/min (A) (by C-G formula based on SCr of 4.27 mg/dL (H)). Liver & Pancreas: Recent Labs  Lab 05/08/19 1948 05/09/19 0729 05/10/19 0512  ALBUMIN 2.3* 2.2* 2.2*   No results for input(s): LIPASE, AMYLASE in the last 168 hours. Recent Labs  Lab 05/08/19 0120  AMMONIA 43*    Diabetic: No results for input(s): HGBA1C in the last 72 hours. No results for input(s): GLUCAP in the last 168 hours. Cardiac Enzymes: Recent Labs  Lab 05/08/19 1948  CKTOTAL 78   No results for input(s): PROBNP in the last 8760 hours. Coagulation Profile: No results for input(s): INR, PROTIME in the last 168 hours. Thyroid Function Tests: Recent Labs    05/08/19 0855  TSH 6.549*   Lipid Profile: No results for input(s): CHOL, HDL, LDLCALC, TRIG, CHOLHDL, LDLDIRECT in the last 72 hours. Anemia Panel: Recent Labs    05/08/19 0855 05/08/19 1231  VITAMINB12 142*  --   FOLATE  --  28.7  FERRITIN  --  392*  TIBC  --  172*  IRON  --  15*  RETICCTPCT  --  0.8   Urine analysis:    Component Value Date/Time   COLORURINE YELLOW 05/07/2019 2352   APPEARANCEUR CLOUDY (A) 05/07/2019 2352   LABSPEC 1.012 05/07/2019 2352   PHURINE 6.0 05/07/2019 2352   GLUCOSEU NEGATIVE 05/07/2019 2352   HGBUR LARGE (A) 05/07/2019 2352   BILIRUBINUR NEGATIVE 05/07/2019 2352   [place] 05/07/2019 2352   PROTEINUR 100 (A) 05/07/2019 2352   NITRITE NEGATIVE 05/07/2019 2352   LEUKOCYTESUR LARGE (A) 05/07/2019 2352   Sepsis Labs: Invalid input(s): PROCALCITONIN, [place]  Microbiology: Recent Results (from the past 240 hour(s))  SARS CORONAVIRUS 2 (TAT 6-24 HRS) Nasopharyngeal Nasopharyngeal Swab     Status: None   Collection Time: 05/07/19  8:48 PM   Specimen: Nasopharyngeal Swab  Result Value Ref Range Status   SARS Coronavirus 2 NEGATIVE NEGATIVE Final    Comment: (NOTE) SARS-CoV-2 target nucleic acids are NOT DETECTED. The SARS-CoV-2 RNA is generally detectable in upper and lower respiratory specimens during the acute phase of infection. Negative results do not preclude SARS-CoV-2 infection, do not rule out co-infections with other pathogens, and should not be used as the sole basis for treatment or other patient management decisions. Negative results must be combined  with clinical observations, patient history, and epidemiological information. The expected result is Negative. Fact Sheet for Patients: SugarRoll.be Fact Sheet for Healthcare Providers: https://www.woods-mathews.com/ This test is not yet approved or cleared by the [place] FDA and  has been authorized for detection and/or diagnosis of SARS-CoV-2 by FDA under an Emergency Use Authorization (EUA). This EUA will remain  in effect (meaning this test can be used) for the duration of the COVID-19 declaration  under Section 56 4(b)(1) of the Act, 21 U.S.C. section 360bbb-3(b)(1), unless the authorization is terminated or revoked sooner. Performed at [facility], [place] [address], [place], [place] [address]   Urine culture     Status: None   Collection Time: 05/07/19 11:45 PM   Specimen: Urine, Random  Result Value Ref Range Status   Specimen Description URINE, RANDOM  Final   Special Requests NONE  Final   Culture   Final    NO GROWTH Performed at [facility], [place] [address], [place], [place] [address]    Report Status 05/08/2019 FINAL  Final  Culture, blood (routine x 2)     Status: None (Preliminary result)   Collection Time: 05/08/19  1:20 AM   Specimen: BLOOD  Result Value Ref Range Status   Specimen Description BLOOD RIGHT ARM  Final   Special Requests   Final    BOTTLES DRAWN AEROBIC AND ANAEROBIC Blood Culture adequate volume   Culture   Final    NO GROWTH 2 DAYS Performed at [facility], [place] [address] [place], [place] [address]    Report Status PENDING  Incomplete  Culture, blood (routine x 2)     Status: None (Preliminary result)   Collection Time: 05/08/19  1:20 AM   Specimen: BLOOD  Result Value Ref Range Status   Specimen Description BLOOD LEFT ARM  Final   Special Requests   Final    BOTTLES DRAWN AEROBIC AND ANAEROBIC Blood Culture adequate volume   Culture   Final    NO GROWTH 2 DAYS Performed at  [facility], [address], [place], [place] [address]    Report Status PENDING  Incomplete    Radiology Studies: No results found.   Aedyn Kempfer T. [place]  If 7PM-7AM, please contact night-coverage [email or web address] Password [facility] 05/10/2019, 2:56 PM

## 2019-05-10 NOTE — Progress Notes (Signed)
Pt still has a fever after scheduled Tylenol was given. Temp. Only came down to 101.4 from 101.7. RN notified NP on call to make aware.   Eleanora Neighbor, RN

## 2019-05-11 LAB — RENAL FUNCTION PANEL
Albumin: 2.1 g/dL — ABNORMAL LOW (ref 3.5–5.0)
Anion gap: 10 (ref 5–15)
BUN: 41 mg/dL — ABNORMAL HIGH (ref 8–23)
CO2: 18 mmol/L — ABNORMAL LOW (ref 22–32)
Calcium: 7.9 mg/dL — ABNORMAL LOW (ref 8.9–10.3)
Chloride: 109 mmol/L (ref 98–111)
Creatinine, Ser: 3.71 mg/dL — ABNORMAL HIGH (ref 0.61–1.24)
GFR calc Af Amer: 18 mL/min — ABNORMAL LOW (ref >60)
GFR calc non Af Amer: 16 mL/min — ABNORMAL LOW (ref >60)
Glucose, Bld: 95 mg/dL (ref 70–99)
Phosphorus: 4.4 mg/dL (ref 2.5–4.6)
Potassium: 3.7 mmol/L (ref 3.5–5.1)
Sodium: 137 mmol/L (ref 135–145)

## 2019-05-11 LAB — CBC
HCT: 25.5 % — ABNORMAL LOW (ref 39.0–52.0)
Hemoglobin: 8.1 g/dL — ABNORMAL LOW (ref 13.0–17.0)
MCH: 28 pg (ref 26.0–34.0)
MCHC: 31.8 g/dL (ref 30.0–36.0)
MCV: 88.2 fL (ref 80.0–100.0)
Platelets: 349 10*3/uL (ref 150–400)
RBC: 2.89 MIL/uL — ABNORMAL LOW (ref 4.22–5.81)
RDW: 16.7 % — ABNORMAL HIGH (ref 11.5–15.5)
WBC: 9.2 10*3/uL (ref 4.0–10.5)
nRBC: 0 % (ref 0.0–0.2)

## 2019-05-11 LAB — MPO/PR-3 (ANCA) ANTIBODIES
ANCA Proteinase 3: <3.5 U/mL (ref 0.0–3.5)
Myeloperoxidase Abs: <9 U/mL (ref 0.0–9.0)

## 2019-05-11 LAB — MAGNESIUM: Magnesium: 2.5 mg/dL — ABNORMAL HIGH (ref 1.7–2.4)

## 2019-05-11 MED ORDER — SODIUM CHLORIDE 0.9 % IV SOLN
510.0000 mg | Freq: Once | INTRAVENOUS | Status: AC
  Administered 2019-05-12: 510 mg via INTRAVENOUS
  Filled 2019-05-11: qty 17

## 2019-05-11 NOTE — Progress Notes (Signed)
Subjective:  Febrile overnight.   Jeremy Sherman better still - pt seems clearer mentally today  Objective Vital signs in last 24 hours: Vitals:   05/10/19 2300 05/11/19 0059 05/11/19 0426 05/11/19 0902  BP:   (!) 148/65 (!) 173/76  Pulse:   64 64  Resp:   16 18  Temp: (!) 101.4 F (38.6 C) (!) 100.4 F (38 C) 100 F (37.8 C) 98.7 F (37.1 C)  TempSrc: Oral Oral Oral Oral  SpO2:   97% 99%  Weight:      Height:       Weight change:   Intake/Output Summary (Last 24 hours) at 05/11/2019 1118 Last data filed at 05/11/2019 0900 Gross per 24 hour  Intake 561.28 ml  Output 1450 ml  Net -888.72 ml    Assessment/ Plan: Pt is a 69 y.o. yo male with HTN, CAD who was admitted on 05/07/2019 with AKI (crt 1.08 on 04/21/19)  Assessment/Plan: 1. Renal-  True AKI from normal crt on 04/21/19.  Left hospital with indwelling foley-  Has stayed in place and changed out per home health on Monday.  Many new meds per cardiology including brilinta and a statin.  U/A showing 100 of prot, white and red blood cells- possible UTI but no growth on culture.  Renal u/s normal with 12 cm kidneys.  Is non oliguric. BP has not been low.  CK 78.  ANA negative, complements WNLand ANCA pending. Protein to crt ratio low. At a bit of a loss as to what has caused his AKI.   Right now non oliguric and crt trending better - was very slow to improve but made more of a jump today  -  No indication for dialysis - cont to follow 2. HTN/volume-  Appeared dry if anything-  Had not been eating well-  Was getting IVF -  Now BP is up have stopped 3. Anemia-  Significant-  Low iron stores-  First feraheme dose 3/12, will order second for 3/15.  Have not given ESA 4. Metabolic acidosis - on oral bicarb - improving some 5. Fever-  Per primary- ceftriaxone had been stopped      Jeremy Sherman    Labs: Basic Metabolic Panel: Recent Labs  Lab 05/09/19 0729 05/10/19 0512 05/11/19 0405  NA 136 138 137  K 4.3 4.0 3.7  CL  108 108 109  CO2 18* 20* 18*  GLUCOSE 88 89 95  BUN 47* 46* 41*  CREATININE 4.35* 4.27* 3.71*  CALCIUM 7.7* 7.9* 7.9*  PHOS 4.9* 5.0* 4.4   Liver Function Tests: Recent Labs  Lab 05/09/19 0729 05/10/19 0512 05/11/19 0405  ALBUMIN 2.2* 2.2* 2.1*   No results for input(s): LIPASE, AMYLASE in the last 168 hours. Recent Labs  Lab 05/08/19 0120  AMMONIA 43*   CBC: Recent Labs  Lab 05/07/19 1831 05/07/19 1831 05/09/19 0729 05/10/19 0512 05/11/19 0405  WBC 7.2   < > 5.0 5.7 9.2  HGB 9.3*   < > 7.8* 7.8* 8.1*  HCT 29.5*   < > 24.5* 24.9* 25.5*  MCV 90.5  --  91.4 90.2 88.2  PLT 343   < > 302 327 349   < > = values in this interval not displayed.   Cardiac Enzymes: Recent Labs  Lab 05/08/19 1948  CKTOTAL 78   CBG: No results for input(s): GLUCAP in the last 168 hours.  Iron Studies:  Recent Labs    05/08/19 1231  IRON 15*  TIBC 172*  FERRITIN 392*   Studies/Results: No results found. Medications: Infusions:   Scheduled Medications: . acetaminophen  1,000 mg Oral Q8H  . apixaban  5 mg Oral BID  . atorvastatin  80 mg Oral q1800  . Chlorhexidine Gluconate Cloth  6 each Topical Daily  . feeding supplement (NEPRO CARB STEADY)  237 mL Oral BID BM  . influenza vaccine adjuvanted  0.5 mL Intramuscular Tomorrow-1000  . levothyroxine  100 mcg Oral Q0600  . pneumococcal 23 valent vaccine  0.5 mL Intramuscular Tomorrow-1000  . sodium bicarbonate  650 mg Oral TID  . ticagrelor  90 mg Oral BID  . vitamin B-12  1,000 mcg Oral Daily    have reviewed scheduled and prn medications.  Physical Exam: General: eyes closed. Seems better in terms of confusion, - knows [facility] hospital  Heart: RRR Lungs: mostly clear Abdomen: soft, non tender-- indwelling foley  Extremities: maybe trace edema    05/11/2019,11:18 AM  LOS: 3 days

## 2019-05-11 NOTE — Progress Notes (Signed)
PROGRESS NOTE  Jeremy Sherman [id number] DOB: Mar 18, 1950   PCP: Imagene Riches, NP  Patient is from: home. Uses walker with help recently.   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  Per family, has some confusion as well.  In ED, hemodynamically stable.  Creatinine 4.1 (baseline 0.9-1). Na 133.  K3.8.  BUN 43.  Hgb 9.3.  Bicarb 20.  AG 12.  Ammonia 43.  LA 1.9.  UA concerning for UTI.  CT head without acute finding.  CXR stable cardiomegaly.  Renal ultrasound without significant finding.  COVID-19 negative.  Patient was admitted for AKI, CAUTI and AME.  Started on IV fluid and ceftriaxone.   Nephrology consulted the next day.  Urine culture no growth to date.  Ceftriaxone discontinued.   05/11/2019: Patient seen.  Nephrology input is appreciated.  Continue management as per nephrology team.  Subjective: Patient is a poor historian. No new complaints.  Objective: Vitals:   05/10/19 2300 05/11/19 0059 05/11/19 0426 05/11/19 0902  BP:   (!) 148/65 (!) 173/76  Pulse:   64 64  Resp:   16 18  Temp: (!) 101.4 F (38.6 C) (!) 100.4 F (38 C) 100 F (37.8 C) 98.7 F (37.1 C)  TempSrc: Oral Oral Oral Oral  SpO2:   97% 99%  Weight:      Height:        Intake/Output Summary (Last 24 hours) at 05/11/2019 1537 Last data filed at 05/11/2019 1429 Gross per 24 hour  Intake 634 ml  Output 1750 ml  Net -1116 ml   Filed Weights   05/08/19 2035 05/10/19 1331 05/10/19 2113  Weight: 99 kg 99 kg 98 kg    Examination:  GENERAL: Not in any distress.  Patient is awake and alert.  Patient has Foley catheter in situ HEENT: Mild pallor.  No jaundice.  Dry buccal mucosa.   NECK:  Supple.  No apparent JVD.  RESP: Clear to auscultation. CVS: S1-S2.   ABD: Soft and nontender.  Organs are not palpable. .  EXT: No leg edema. NEURO: Awake and alert.  Patient moves all extremities.    Procedures:  None  Assessment & Plan: Acute kidney injury with azotemia: Baseline Cr 0.9-1.0> 4.1 (admit)> 4.72> 4.35> 4.27.  BUN 43> 51> 47> 46.  FENa 0.3% suggestive for prerenal cytology likely from poor p.o. intake.  Not nephrotoxic meds on his list other than topical Voltaren gel.  No GI symptoms.  Renal US, UA, PCR, CK and basic autoimmune labs not revealing.  ANCA pending.  Renal function slowly improving.  -Appreciate guidance and insight by nephrology-discontinued IVF due to blood pressure -Follow ANCA -Avoid nephrotoxic meds 05/11/2019: AKI slowly improving.  Serum creatinine is 3.71 today (down from 4.27).  Nephrology team is directing care.  Pyuria/hematuria in patient with chronic indwelling Foley catheter due to urine retention/BPH: Initially thought to be CAUTI based on UA and encephalopathy which is now unlikely. No leukocytosis, fever, suprapubic or CVA tenderness.  Urine culture negative -Stopped IV ceftriaxone.  Acute metabolic encephalopathy: Suspect some underlying cognitive impairment.  Multifactorial-dehydration, AKI with azotemia,  hyperammonia, low vitamin B12, and tramadol if he has been taking.  No focal neuro deficits other than BLE  weakness.  TSH slightly high but markedly improved from prior.  He is oriented x4 except year but slow.  -Treat treatable causes-AKI, vitamin B12 deficiency and hyperammonia -Delirium precautions and frequent reorientation's -Started Prozac for labile mood but discontinued due to nausea.   Normocytic anemia: IDA and ACD. Baseline Hgb 7-9> 9.3 (admit)>> 7.8> 7.8.  Pattern suggests dilution from IV fluid.  Iron sats 9%.  Vitamin B12 low at 142. -Gave IM B12 1000 mcg once.  Continue p.o. -IV iron and ESA per nephrology -Check FOBT to  exclude GI bleed.  He is on Brilinta and Eliquis.  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  No focal neuro deficit other than BLE weakness.  He was discharged home from rehab on 2/25. -Continue Eliquis and Brilinta -Continue statin -PT/OT  History of CAD/CABG in 2009-no cardiopulmonary symptoms. -Continue Lipitor, Eliquis and Brilinta as above.  Hypertension:  Optimize.   Continue to monitor closely.    Hypothyroidism: TSH 6.5 -Continue home Synthroid  Mild metabolic acidosis: Likely due to renal failure and IV fluid.  Improving -On bicarb per nephrology.  Hyponatremia: Resolved.  Hyperphosphatemia: Likely due to renal failure. -Per nephrology  Debility generalized weakness -PT/OT  Chronic pain -Scheduled Tylenol 1 g 3 times daily -Avoid sedating medications  GERD -Continue PPI  Nutrition: poor by mouth intake and nausea -Dietitian consulted.  Nutrition Problem: Inadequate oral intake Etiology: acute illness, poor appetite  Signs/Symptoms: meal completion < 25%  Interventions: Magic cup, Refer to RD note for recommendations, Liberalize Diet, Nepro shake   DVT prophylaxis: On Eliquis Code Status: Full code Family Communication:   Discharge barrier: Evaluation and treatment of AKI.  Patient is from: Home Final disposition: To be determined pending clinical improvement and clearance by nephrology  Consultants: Nephrology   Microbiology summarized: [id number] negative Urine culture NGTD  Sch Meds:  Scheduled Meds: . acetaminophen  1,000 mg Oral Q8H  . apixaban  5 mg Oral BID  . atorvastatin  80 mg Oral q1800  . Chlorhexidine Gluconate Cloth  6 each Topical Daily  . feeding supplement (NEPRO CARB STEADY)  237 mL Oral BID BM  . influenza vaccine adjuvanted  0.5 mL Intramuscular Tomorrow-1000  . levothyroxine  100 mcg Oral Q0600  . pneumococcal 23 valent vaccine  0.5 mL  Intramuscular Tomorrow-1000  . sodium bicarbonate  650 mg Oral TID  . ticagrelor  90 mg Oral BID  . vitamin B-12  1,000 mcg Oral Daily   Continuous Infusions: . [START ON 05/12/2019] ferumoxytol     PRN Meds:.  Antimicrobials: Anti-infectives (From admission, onward)   Start     Dose/Rate Route Frequency Ordered Stop   05/08/19 0130  cefTRIAXone (ROCEPHIN) 1 g in sodium chloride 0.9 % 100 mL IVPB  Status:  Discontinued     1 g 200 mL/hr over 30 Minutes Intravenous Daily at bedtime 05/08/19 0100 05/09/19 0752       I have personally reviewed the following labs and images: CBC: Recent Labs  Lab 05/07/19 1831 05/09/19 0729 05/10/19 0512 05/11/19 0405  WBC 7.2 5.0 5.7 9.2  HGB 9.3* 7.8* 7.8* 8.1*  HCT 29.5* 24.5* 24.9* 25.5*  MCV 90.5 91.4 90.2 88.2  PLT 343 302 327 349   BMP &GFR Recent Labs  Lab 05/08/19 0130 05/08/19 0130 05/08/19 0230 05/08/19 1948 05/09/19 0729 05/10/19 0512 05/11/19 0405  NA 131*   < > 134* 135 136 138 137  K 5.8*   < > 4.7 4.2 4.3 4.0 3.7  CL 100   < > 102 107 108 108 109  CO2 16*   < > 20* 16* 18* 20* 18*  GLUCOSE 131*   < > 123* 120* 88 89 95  BUN 47*   < > 47* 51* 47* 46* 41*  CREATININE 4.45*   < > 4.59* 4.72* 4.35* 4.27* 3.71*  CALCIUM 8.4*   < > 8.5* 7.9* 7.7* 7.9* 7.9*  MG 2.9*  --   --   --  2.7* 2.8* 2.5*  PHOS  --   --   --  4.9* 4.9* 5.0* 4.4   < > = values in this interval not displayed.   Estimated Creatinine Clearance: 23.3 mL/min (A) (by C-G formula based on SCr of 3.71 mg/dL (H)). Liver & Pancreas: Recent Labs  Lab 05/08/19 1948 05/09/19 0729 05/10/19 0512 05/11/19 0405  ALBUMIN 2.3* 2.2* 2.2* 2.1*   No results for input(s): LIPASE, AMYLASE in the last 168 hours. Recent Labs  Lab 05/08/19 0120  AMMONIA 43*   Diabetic: No results for input(s): HGBA1C in the last 72 hours. No results for input(s): GLUCAP in the last 168 hours. Cardiac Enzymes: Recent Labs  Lab 05/08/19 1948  CKTOTAL 78   No results for  input(s): PROBNP in the last 8760 hours. Coagulation Profile: No results for input(s): INR, PROTIME in the last 168 hours. Thyroid Function Tests: No results for input(s): TSH, T4TOTAL, FREET4, T3FREE, THYROIDAB in the last 72 hours. Lipid Profile: No results for input(s): CHOL, HDL, LDLCALC, TRIG, CHOLHDL, LDLDIRECT in the last 72 hours. Anemia Panel: No results for input(s): VITAMINB12, FOLATE, FERRITIN, TIBC, IRON, RETICCTPCT in the last 72 hours. Urine analysis:    Component Value Date/Time   COLORURINE YELLOW 05/07/2019 2352   APPEARANCEUR CLOUDY (A) 05/07/2019 2352   LABSPEC 1.012 05/07/2019 2352   PHURINE 6.0 05/07/2019 2352   GLUCOSEU NEGATIVE 05/07/2019 2352   HGBUR LARGE (A) 05/07/2019 2352   BILIRUBINUR NEGATIVE 05/07/2019 2352   [place] 05/07/2019 2352   PROTEINUR 100 (A) 05/07/2019 2352   NITRITE NEGATIVE 05/07/2019 2352   LEUKOCYTESUR LARGE (A) 05/07/2019 2352   Sepsis Labs: Invalid input(s): PROCALCITONIN, [place]  Microbiology: Recent Results (from the past 240 hour(s))  SARS CORONAVIRUS 2 (TAT 6-24 HRS) Nasopharyngeal Nasopharyngeal Swab     Status: None   Collection Time: 05/07/19  8:48 PM   Specimen: Nasopharyngeal Swab  Result Value Ref Range Status   SARS Coronavirus 2 NEGATIVE NEGATIVE Final    Comment: (NOTE) SARS-CoV-2 target nucleic acids are NOT DETECTED. The SARS-CoV-2 RNA is generally detectable in upper and lower respiratory specimens during the acute phase of infection. Negative results do not preclude SARS-CoV-2 infection, do not rule out co-infections with other pathogens, and should not be used as the sole basis for treatment or other patient management decisions. Negative results must be combined with clinical observations, patient history, and epidemiological information. The expected result is Negative. Fact Sheet for Patients: SugarRoll.be Fact Sheet for Healthcare  Providers: https://www.woods-mathews.com/ This test is not yet approved or cleared by the [place] FDA and  has been authorized for detection and/or diagnosis of SARS-CoV-2 by FDA under an Emergency Use Authorization (EUA). This EUA will remain  in effect (meaning this test can be used) for the duration of the COVID-19 declaration under Section 56 4(b)(1) of the Act, 21 U.S.C. section 360bbb-3(b)(1), unless the authorization is terminated or revoked sooner. Performed at [facility], [place] [address], [place], [place] [address]   Urine culture     Status: None   Collection Time: 05/07/19 11:45 PM   Specimen: Urine, Random  Result Value Ref Range Status   Specimen Description URINE, RANDOM  Final   Special Requests NONE  Final   Culture   Final    NO GROWTH Performed at [facility], [place] [address] [place], [place] [address]    Report Status 05/08/2019 FINAL  Final  Culture, blood (routine x 2)     Status: None (Preliminary result)   Collection Time: 05/08/19  1:20 AM   Specimen: BLOOD  Result Value Ref Range Status   Specimen Description BLOOD RIGHT ARM  Final   Special Requests   Final    BOTTLES DRAWN AEROBIC AND ANAEROBIC Blood Culture adequate volume   Culture   Final    NO GROWTH 3 DAYS Performed at [facility], [place] [address], [place], [place] [address]    Report Status PENDING  Incomplete  Culture, blood (routine x 2)     Status: None (Preliminary result)   Collection Time: 05/08/19  1:20 AM   Specimen: BLOOD  Result Value Ref Range Status   Specimen Description BLOOD LEFT ARM  Final   Special Requests   Final    BOTTLES DRAWN AEROBIC AND ANAEROBIC Blood Culture adequate volume   Culture   Final    NO GROWTH 3 DAYS Performed at [facility], [address] [place], [place] [address]    Report Status PENDING  Incomplete    Radiology Studies: No results found.   Bonnell Public, M.D. [facility]  If  7PM-7AM, please contact night-coverage [email or web address] Password [facility] 05/11/2019, 3:37 PM

## 2019-05-12 LAB — RENAL FUNCTION PANEL
Albumin: 2.1 g/dL — ABNORMAL LOW (ref 3.5–5.0)
Anion gap: 11 (ref 5–15)
BUN: 40 mg/dL — ABNORMAL HIGH (ref 8–23)
CO2: 21 mmol/L — ABNORMAL LOW (ref 22–32)
Calcium: 7.9 mg/dL — ABNORMAL LOW (ref 8.9–10.3)
Chloride: 109 mmol/L (ref 98–111)
Creatinine, Ser: 3.39 mg/dL — ABNORMAL HIGH (ref 0.61–1.24)
GFR calc Af Amer: 20 mL/min — ABNORMAL LOW (ref >60)
GFR calc non Af Amer: 17 mL/min — ABNORMAL LOW (ref >60)
Glucose, Bld: 102 mg/dL — ABNORMAL HIGH (ref 70–99)
Phosphorus: 4.3 mg/dL (ref 2.5–4.6)
Potassium: 3.8 mmol/L (ref 3.5–5.1)
Sodium: 141 mmol/L (ref 135–145)

## 2019-05-12 LAB — HEPATITIS B SURFACE ANTIGEN: Hepatitis B Surface Ag: NONREACTIVE

## 2019-05-12 LAB — MAGNESIUM: Magnesium: 2.4 mg/dL (ref 1.7–2.4)

## 2019-05-12 LAB — CBC
HCT: 25.9 % — ABNORMAL LOW (ref 39.0–52.0)
Hemoglobin: 8.2 g/dL — ABNORMAL LOW (ref 13.0–17.0)
MCH: 28.5 pg (ref 26.0–34.0)
MCHC: 31.7 g/dL (ref 30.0–36.0)
MCV: 89.9 fL (ref 80.0–100.0)
Platelets: 354 10*3/uL (ref 150–400)
RBC: 2.88 MIL/uL — ABNORMAL LOW (ref 4.22–5.81)
RDW: 17 % — ABNORMAL HIGH (ref 11.5–15.5)
WBC: 12 10*3/uL — ABNORMAL HIGH (ref 4.0–10.5)
nRBC: 0 % (ref 0.0–0.2)

## 2019-05-12 LAB — HEPATITIS C ANTIBODY: HCV Ab: NONREACTIVE

## 2019-05-12 MED ORDER — ACETAMINOPHEN 325 MG PO TABS
650.0000 mg | ORAL_TABLET | Freq: Four times a day (QID) | ORAL | Status: DC | PRN
  Administered 2019-05-13 – 2019-05-25 (×10): 650 mg via ORAL
  Filled 2019-05-12 (×11): qty 2

## 2019-05-12 NOTE — Progress Notes (Signed)
Occupational Therapy Treatment Patient Details Name: Jeremy Sherman MRN: [id number] DOB: 08/07/1950 Today's Date: 05/12/2019    History of present illness 69 year old male admitted to ED on 3/10 with AKI, AMS, R chest wall pain. Pt with foley catheter use x2 weeks. Pt with recent hospitalization for CVA and aneurysm repair, d/c from CIR on 04/21/2019. 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   OT comments  Pt in bed upon arrival with c/o of feeling cold, moaning and requesting more blankets. Pt agreeable to OT, however refused to sit up in recliner at end of session as he was very fixated on being cold. Session focused on be mobility, ADLs sitting EOB, BSC transfers. Assisted pt  back to bed and provided warm blankets. OT will continue to follow acutely  Follow Up Recommendations  Home health OT;Supervision/Assistance - 24 hour    Equipment Recommendations  Hospital bed    Recommendations for Other Services      Precautions / Restrictions Precautions Precautions: Fall Restrictions Weight Bearing Restrictions: No       Mobility Bed Mobility Overal bed mobility: Needs Assistance Bed Mobility: Rolling;Sidelying to Sit;Sit to Supine Rolling: Min assist Sidelying to sit: Min assist;HOB elevated   Sit to supine: Mod assist;HOB elevated      Transfers Overall transfer level: Needs assistance Equipment used: Rolling walker (2 wheeled);1 person hand held assist Transfers: Sit to/from Stand Sit to Stand: Min assist;From elevated surface         General transfer comment: cues for correct hand placement    Balance Overall balance assessment: Needs assistance Sitting-balance support: Feet supported Sitting balance-Leahy Scale: Fair     Standing balance support: Bilateral upper extremity supported;During functional  activity Standing balance-Leahy Scale: Poor                             ADL either performed or assessed with clinical judgement   ADL Overall ADL's : Needs assistance/impaired Eating/Feeding: Set up;Sitting;Bed level   Grooming: Wash/dry hands;Wash/dry face;Set up;Supervision/safety   Upper Body Bathing: Min guard;Supervision/ safety;Sitting Upper Body Bathing Details (indicate cue type and reason): simulated Lower Body Bathing: Moderate assistance;Minimal assistance;Sitting/lateral leans;Sit to/from stand Lower Body Bathing Details (indicate cue type and reason): simulated Upper Body Dressing : Min guard;Sitting       Toilet Transfer: Minimal assistance;Stand-pivot;RW;Cueing for safety   Toileting- Clothing Manipulation and Hygiene: Maximal assistance       Functional mobility during ADLs: Minimal assistance;Rolling walker;Cueing for safety;Cueing for sequencing       Vision Baseline Vision/History: Wears glasses Patient Visual Report: No change from baseline Vision Assessment?: No apparent visual deficits   Perception     Praxis      Cognition Arousal/Alertness: Awake/alert Behavior During Therapy: Anxious Overall Cognitive Status: Impaired/Different from baseline Area of Impairment: Orientation;Attention;Memory;Following commands;Safety/judgement;Awareness;Problem solving                 Orientation Level: Disoriented to;Time     Following Commands: Follows one step commands inconsistently;Follows one step commands with increased time Safety/Judgement: Decreased awareness of safety;Decreased awareness of deficits   Problem Solving: Slow processing;Decreased initiation;Requires verbal cues          Exercises     Shoulder Instructions       General Comments      Pertinent Vitals/ Pain       Pain Assessment: Faces  Faces Pain Scale: Hurts even more Pain Location: R knee Pain Descriptors / Indicators:  Guarding;Grimacing;Moaning Pain Intervention(s): Limited activity within patient's tolerance;Monitored during session;Repositioned  Home Living                                          Prior Functioning/Environment              Frequency  Min 3X/week        Progress Toward Goals  OT Goals(current goals can now be found in the care plan section)  Progress towards OT goals: OT to reassess next treatment     Plan Discharge plan remains appropriate    Co-evaluation                 AM-PAC OT "6 Clicks" Daily Activity     Outcome Measure   Help from another person eating meals?: None Help from another person taking care of personal grooming?: A Little Help from another person toileting, which includes using toliet, bedpan, or urinal?: A Lot Help from another person bathing (including washing, rinsing, drying)?: A Lot Help from another person to put on and taking off regular upper body clothing?: A Little Help from another person to put on and taking off regular lower body clothing?: A Lot 6 Click Score: 16    End of Session Equipment Utilized During Treatment: Gait belt;Rolling walker  OT Visit Diagnosis: Unsteadiness on feet (R26.81);Other abnormalities of gait and mobility (R26.89);Muscle weakness (generalized) (M62.81);Ataxia, unspecified (R27.0);Other symptoms and signs involving cognitive function;Pain Pain - Right/Left: Right Pain - part of body: Knee;Leg   Activity Tolerance Patient tolerated treatment well   Patient Left with call bell/phone within reach;in bed;with bed alarm set   Nurse Communication          Time: [id number] OT Time Calculation (min): 15 min  Charges: OT General Charges $OT Visit: 1 Visit OT Treatments $Self Care/Home Management : 8-22 mins    Britt Bottom 05/12/2019, 1:49 PM

## 2019-05-12 NOTE — Plan of Care (Signed)
  Problem: Nutritional: Goal: Ability to make appropriate dietary choices will improve Outcome: Not Progressing   

## 2019-05-12 NOTE — Progress Notes (Signed)
PROGRESS NOTE  Jeremy Sherman Sherman [id number] DOB: Dec 12, 1950   PCP: Imagene Riches, NP  Patient is from: home. Uses walker with help recently.   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  Per family, patient has some confusion as well.  In ED, hemodynamically stable.  Creatinine 4.1 (baseline 0.9-1). Na 133.  K3.8.  BUN 43.  Hgb 9.3.  Bicarb 20.  AG 12.  Ammonia 43.  LA 1.9.  UA concerning for UTI.  CT head without acute finding.  CXR stable cardiomegaly.  Renal ultrasound without significant finding.  COVID-19 negative.  Patient was admitted for AKI, CAUTI and AME.  Started on IV fluid and ceftriaxone.   Nephrology consulted the next day.  Urine culture no growth to date.  Ceftriaxone discontinued.   05/12/2019: Etiology of AKI remains unclear.  Serum creatinine is improving.  Serum creatinine today is 3.39 (down from 3.71).  Further renal work-up is in progress.  Patient reported poor p.o. intake and poor appetite today.  According to patient's wife, patient feels cold chronically.  Will discharge patient once cleared for discharge by nephrology team.   Subjective: Patient is a poor historian. No new complaints.  Objective: Vitals:   05/11/19 1811 05/11/19 2031 05/12/19 0430 05/12/19 0922  BP: 122/61 127/66 (!) 162/80 (!) 155/81  Pulse: 76 65 67 69  Resp: 18 18 16 18   Temp: 99 F (37.2 C) 99 F (37.2 C) 98.3 F (36.8 C) 98 F (36.7 C)  TempSrc: Oral Oral Oral Oral  SpO2: 96% 97% 98% 100%  Weight:  97.6 kg    Height:        Intake/Output Summary (Last 24 hours) at 05/12/2019 1743 Last data filed at 05/12/2019 1339 Gross per 24 hour    Intake 820 ml  Output 1075 ml  Net -255 ml   Filed Weights   05/10/19 1331 05/10/19 2113 05/11/19 2031  Weight: 99 kg 98 kg 97.6 kg    Examination:  GENERAL: Not in any distress.  Patient is awake and alert.  Patient has Foley catheter in situ HEENT: Mild pallor.  No jaundice.  Dry buccal mucosa.   NECK: Supple.  No apparent JVD.  RESP: Clear to auscultation. CVS: S1-S2.   ABD: Soft and nontender.  Organs are not palpable. .  EXT: No leg edema. NEURO: Awake and alert.  Patient moves all extremities.    Procedures:  None  Assessment & Plan: Acute kidney injury: -Baseline Cr 0.9-1.0> 4.1 (admit)> 4.72> 4.35> 4.27.  BUN 43> 51> 47> 46.   -Nephrology is directing work-up. -ANCA is negative.   -Avoid nephrotoxic meds 05/12/2019: AKI slowly improving.  Serum creatinine is 3.39 today.  Pursue disposition once cleared for discharge by nephrology.    Pyuria/hematuria in patient with chronic indwelling Foley catheter due to urine retention/BPH: -Urine culture came back negative -IV antibiotics discontinued -Patient has chronic Foley catheter  Acute metabolic encephalopathy:  -Suspect some underlying cognitive impairment. -Multifactorial-dehydration, AKI,  hyperammonia, low vitamin B12, and tramadol if he has been taking.  Normocytic anemia:  IDA and ACD. Baseline Hgb 7-9> 9.3 (admit)>> 7.8> 7.8.  Pattern suggests dilution from IV fluid.  Iron sats 9%.  Vitamin B12 low at 142. -Gave IM B12 1000 mcg once.  Continue p.o. -IV iron and ESA per nephrology -Check FOBT to exclude GI bleed.  He is on Brilinta and Eliquis. 05/12/2019: Likely multifactorial.  Continue to monitor and manage accordingly.  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  No focal neuro deficit other than BLE weakness.  He was discharged home from rehab on 2/25. -Continue Eliquis and Brilinta -Continue  statin -PT/OT  History of coronary artery disease: -CABG in 2009 -Stable.  Hypertension:  Optimize.   Continue to monitor closely.    Hypothyroidism:  -TSH 6.5 -Continue home Synthroid  Hyponatremia:  Resolved.  Hyperphosphatemia: Likely due to renal failure: -Per nephrology  Debility generalized weakness: -PT/OT  Chronic pain: -Scheduled Tylenol 1 g 3 times daily -Avoid sedating medications  GERD -Continue PPI  Nutrition:  poor by mouth intake and nausea -Dietitian consulted. -Caloric count  Nutrition Problem: Inadequate oral intake Etiology: acute illness, poor appetite  Signs/Symptoms: meal completion < 25%  Interventions: Magic cup, Refer to RD note for recommendations, Liberalize Diet, Nepro shake   DVT prophylaxis: On Eliquis Code Status: Full code Family Communication:   Discharge barrier: Evaluation and treatment of AKI.  Patient is from: Home Final disposition: To be determined pending clinical improvement and clearance by nephrology  Consultants: Nephrology   Microbiology summarized: [id number] negative Urine culture NGTD  Sch Meds:  Scheduled Meds: . acetaminophen  1,000 mg Oral Q8H  . apixaban  5 mg Oral BID  . atorvastatin  80 mg Oral q1800  . Chlorhexidine Gluconate Cloth  6 each Topical Daily  . feeding supplement (NEPRO CARB STEADY)  237 mL Oral BID BM  . influenza vaccine adjuvanted  0.5 mL Intramuscular Tomorrow-1000  . levothyroxine  100 mcg Oral Q0600  . pneumococcal 23 valent vaccine  0.5 mL Intramuscular Tomorrow-1000  . sodium bicarbonate  650 mg Oral TID  . ticagrelor  90 mg Oral BID  . vitamin B-12  1,000 mcg Oral Daily   Continuous Infusions:  PRN Meds:.  Antimicrobials: Anti-infectives (From admission, onward)   Start     Dose/Rate Route Frequency Ordered Stop   05/08/19 0130  cefTRIAXone (ROCEPHIN) 1 g in sodium chloride 0.9 % 100 mL IVPB  Status:  Discontinued     1 g 200 mL/hr over 30 Minutes Intravenous  Daily at bedtime 05/08/19 0100 05/09/19 0752       I have personally reviewed the following labs and images: CBC: Recent Labs  Lab 05/07/19 1831 05/09/19 0729 05/10/19 0512 05/11/19 0405 05/12/19 0302  WBC 7.2 5.0 5.7 9.2 12.0*  HGB 9.3* 7.8* 7.8* 8.1* 8.2*  HCT 29.5* 24.5* 24.9* 25.5* 25.9*  MCV 90.5 91.4 90.2 88.2 89.9  PLT 343 302 327 349 354   BMP &GFR Recent Labs  Lab 05/08/19 0130 05/08/19 0230 05/08/19 1948 05/09/19 0729 05/10/19 0512 05/11/19 0405 05/12/19 0302  NA 131*   < > 135 136 138 137 141  K 5.8*   < > 4.2 4.3 4.0 3.7 3.8  CL 100   < > 107 108 108 109 109  CO2 16*   < > 16* 18* 20* 18* 21*  GLUCOSE 131*   < > 120* 88 89 95 102*  BUN 47*   < > 51* 47* 46* 41* 40*  CREATININE 4.45*   < > 4.72* 4.35* 4.27* 3.71* 3.39*  CALCIUM  8.4*   < > 7.9* 7.7* 7.9* 7.9* 7.9*  MG 2.9*  --   --  2.7* 2.8* 2.5* 2.4  PHOS  --   --  4.9* 4.9* 5.0* 4.4 4.3   < > = values in this interval not displayed.   Estimated Creatinine Clearance: 25.6 mL/min (A) (by C-G formula based on SCr of 3.39 mg/dL (H)). Liver & Pancreas: Recent Labs  Lab 05/08/19 1948 05/09/19 0729 05/10/19 0512 05/11/19 0405 05/12/19 0302  ALBUMIN 2.3* 2.2* 2.2* 2.1* 2.1*   No results for input(s): LIPASE, AMYLASE in the last 168 hours. Recent Labs  Lab 05/08/19 0120  AMMONIA 43*   Diabetic: No results for input(s): HGBA1C in the last 72 hours. No results for input(s): GLUCAP in the last 168 hours. Cardiac Enzymes: Recent Labs  Lab 05/08/19 1948  CKTOTAL 78   No results for input(s): PROBNP in the last 8760 hours. Coagulation Profile: No results for input(s): INR, PROTIME in the last 168 hours. Thyroid Function Tests: No results for input(s): TSH, T4TOTAL, FREET4, T3FREE, THYROIDAB in the last 72 hours. Lipid Profile: No results for input(s): CHOL, HDL, LDLCALC, TRIG, CHOLHDL, LDLDIRECT in the last 72 hours. Anemia Panel: No results for input(s): VITAMINB12, FOLATE, FERRITIN, TIBC,  IRON, RETICCTPCT in the last 72 hours. Urine analysis:    Component Value Date/Time   COLORURINE YELLOW 05/07/2019 2352   APPEARANCEUR CLOUDY (A) 05/07/2019 2352   LABSPEC 1.012 05/07/2019 2352   PHURINE 6.0 05/07/2019 2352   GLUCOSEU NEGATIVE 05/07/2019 2352   HGBUR LARGE (A) 05/07/2019 2352   BILIRUBINUR NEGATIVE 05/07/2019 2352   [place] 05/07/2019 2352   PROTEINUR 100 (A) 05/07/2019 2352   NITRITE NEGATIVE 05/07/2019 2352   LEUKOCYTESUR LARGE (A) 05/07/2019 2352   Sepsis Labs: Invalid input(s): PROCALCITONIN, [place]  Microbiology: Recent Results (from the past 240 hour(s))  SARS CORONAVIRUS 2 (TAT 6-24 HRS) Nasopharyngeal Nasopharyngeal Swab     Status: None   Collection Time: 05/07/19  8:48 PM   Specimen: Nasopharyngeal Swab  Result Value Ref Range Status   SARS Coronavirus 2 NEGATIVE NEGATIVE Final    Comment: (NOTE) SARS-CoV-2 target nucleic acids are NOT DETECTED. The SARS-CoV-2 RNA is generally detectable in upper and lower respiratory specimens during the acute phase of infection. Negative results do not preclude SARS-CoV-2 infection, do not rule out co-infections with other pathogens, and should not be used as the sole basis for treatment or other patient management decisions. Negative results must be combined with clinical observations, patient history, and epidemiological information. The expected result is Negative. Fact Sheet for Patients: SugarRoll.be Fact Sheet for Healthcare Providers: https://www.woods-mathews.com/ This test is not yet approved or cleared by the [place] FDA and  has been authorized for detection and/or diagnosis of SARS-CoV-2 by FDA under an Emergency Use Authorization (EUA). This EUA will remain  in effect (meaning this test can be used) for the duration of the COVID-19 declaration under Section 56 4(b)(1) of the Act, 21 U.S.C. section 360bbb-3(b)(1), unless the  authorization is terminated or revoked sooner. Performed at [facility], [place] [address], [place], [place] [address]   Urine culture     Status: None   Collection Time: 05/07/19 11:45 PM   Specimen: Urine, Random  Result Value Ref Range Status   Specimen Description URINE, RANDOM  Final   Special Requests NONE  Final   Culture   Final    NO GROWTH Performed at [facility], [place],  [place] [address]    Report Status 05/08/2019 FINAL  Final  Culture, blood (routine x 2)     Status: None (Preliminary result)   Collection Time: 05/08/19  1:20 AM   Specimen: BLOOD  Result Value Ref Range Status   Specimen Description BLOOD RIGHT ARM  Final   Special Requests   Final    BOTTLES DRAWN AEROBIC AND ANAEROBIC Blood Culture adequate volume Performed at [facility], [place] [address], [place], [place] [address]    Culture NO GROWTH 4 DAYS  Final   Report Status PENDING  Incomplete  Culture, blood (routine x 2)     Status: None (Preliminary result)   Collection Time: 05/08/19  1:20 AM   Specimen: BLOOD  Result Value Ref Range Status   Specimen Description BLOOD LEFT ARM  Final   Special Requests   Final    BOTTLES DRAWN AEROBIC AND ANAEROBIC Blood Culture adequate volume Performed at [facility], [place] [address], [place], [place] [address]    Culture NO GROWTH 4 DAYS  Final   Report Status PENDING  Incomplete    Radiology Studies: No results found.   Bonnell Public, M.D. [facility]  If 7PM-7AM, please contact night-coverage [email or web address] Password [email or web address] 05/12/2019, 5:43 PM

## 2019-05-13 ENCOUNTER — Ambulatory Visit (HOSPITAL_COMMUNITY): Admission: RE | Admit: 2019-05-13 | Payer: BC Managed Care – PPO | Source: Ambulatory Visit

## 2019-05-13 ENCOUNTER — Inpatient Hospital Stay (HOSPITAL_COMMUNITY): Payer: Medicare Other

## 2019-05-13 LAB — URINALYSIS, ROUTINE W REFLEX MICROSCOPIC
Bilirubin Urine: NEGATIVE
Glucose, UA: NEGATIVE mg/dL
Ketones, ur: NEGATIVE mg/dL
Nitrite: NEGATIVE
Protein, ur: 100 mg/dL — AB
Specific Gravity, Urine: 1.016 (ref 1.005–1.030)
WBC, UA: >50 WBC/hpf — ABNORMAL HIGH (ref 0–5)
pH: 6 (ref 5.0–8.0)

## 2019-05-13 LAB — CBC
HCT: 26.8 % — ABNORMAL LOW (ref 39.0–52.0)
Hemoglobin: 8.4 g/dL — ABNORMAL LOW (ref 13.0–17.0)
MCH: 29.3 pg (ref 26.0–34.0)
MCHC: 31.3 g/dL (ref 30.0–36.0)
MCV: 93.4 fL (ref 80.0–100.0)
Platelets: 375 10*3/uL (ref 150–400)
RBC: 2.87 MIL/uL — ABNORMAL LOW (ref 4.22–5.81)
RDW: 17.5 % — ABNORMAL HIGH (ref 11.5–15.5)
WBC: 14.3 10*3/uL — ABNORMAL HIGH (ref 4.0–10.5)
nRBC: 0 % (ref 0.0–0.2)

## 2019-05-13 LAB — RENAL FUNCTION PANEL
Albumin: 2.1 g/dL — ABNORMAL LOW (ref 3.5–5.0)
Anion gap: 12 (ref 5–15)
BUN: 36 mg/dL — ABNORMAL HIGH (ref 8–23)
CO2: 18 mmol/L — ABNORMAL LOW (ref 22–32)
Calcium: 8.1 mg/dL — ABNORMAL LOW (ref 8.9–10.3)
Chloride: 107 mmol/L (ref 98–111)
Creatinine, Ser: 2.92 mg/dL — ABNORMAL HIGH (ref 0.61–1.24)
GFR calc Af Amer: 24 mL/min — ABNORMAL LOW (ref >60)
GFR calc non Af Amer: 21 mL/min — ABNORMAL LOW (ref >60)
Glucose, Bld: 93 mg/dL (ref 70–99)
Phosphorus: 3.8 mg/dL (ref 2.5–4.6)
Potassium: 4.1 mmol/L (ref 3.5–5.1)
Sodium: 137 mmol/L (ref 135–145)

## 2019-05-13 LAB — KAPPA/LAMBDA LIGHT CHAINS
Kappa free light chain: 220.7 mg/L — ABNORMAL HIGH (ref 3.3–19.4)
Kappa, lambda light chain ratio: 5.4 — ABNORMAL HIGH (ref 0.26–1.65)
Lambda free light chains: 40.9 mg/L — ABNORMAL HIGH (ref 5.7–26.3)

## 2019-05-13 LAB — HEPATITIS B SURFACE ANTIBODY, QUANTITATIVE: Hep B S AB Quant (Post): <3.1 m[IU]/mL — ABNORMAL LOW (ref >9.9)

## 2019-05-13 LAB — CULTURE, BLOOD (ROUTINE X 2)
Culture: NO GROWTH
Culture: NO GROWTH
Special Requests: ADEQUATE
Special Requests: ADEQUATE

## 2019-05-13 LAB — PROCALCITONIN: Procalcitonin: 0.4 ng/mL

## 2019-05-13 LAB — MAGNESIUM: Magnesium: 2.4 mg/dL (ref 1.7–2.4)

## 2019-05-13 MED ORDER — LIDOCAINE HCL URETHRAL/MUCOSAL 2 % EX GEL
1.0000 "application " | Freq: Once | CUTANEOUS | Status: DC
  Filled 2019-05-13: qty 20

## 2019-05-13 MED ORDER — SODIUM CHLORIDE 0.9 % IV SOLN
1.0000 g | INTRAVENOUS | Status: DC
  Administered 2019-05-13 – 2019-05-17 (×5): 1 g via INTRAVENOUS
  Filled 2019-05-13: qty 10
  Filled 2019-05-13 (×2): qty 1
  Filled 2019-05-13: qty 10
  Filled 2019-05-13 (×2): qty 1

## 2019-05-13 MED ORDER — SODIUM CHLORIDE 0.9 % IV SOLN: INTRAVENOUS | Status: DC

## 2019-05-13 NOTE — Progress Notes (Signed)
PROGRESS NOTE  Jeremy Sherman [id number] DOB: [birth year]   PCP: Imagene Riches, NP  Patient is from: home. Uses walker with help recently.   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  Per family, patient has some confusion as well.  In ED, hemodynamically stable.  Creatinine 4.1 (baseline 0.9-1). Na 133.  K3.8.  BUN 43.  Hgb 9.3.  Bicarb 20.  AG 12.  Ammonia 43.  LA 1.9.  UA concerning for UTI.  CT head without acute finding.  CXR stable cardiomegaly.  Renal ultrasound without significant finding.  COVID-19 negative.  Patient was admitted for AKI, CAUTI and AME.  Started on IV fluid and ceftriaxone.   Nephrology consulted the next day.  Urine culture no growth to date.  Ceftriaxone discontinued.   05/12/2019: Etiology of AKI remains unclear.  Serum creatinine is improving.  Serum creatinine today is 3.39 (down from 3.71).  Further renal work-up is in progress.  Patient reported poor p.o. intake and poor appetite today.  According to patient's wife, patient feels cold chronically.  Will discharge patient once cleared for discharge by nephrology team.   05/13/2019: T-max of 100.5 Fahrenheit documented.  Patient continues to report chills, however, patient has thyroid problems.  Nausea vomiting reported.  Abdominal pain reported.  Worsening leukocytosis.  Patient has chronic indwelling Foley's catheter.  Will change Foley's catheter, repeat urinalysis and urine culture, possible blood culture and procalcitonin.  Will start patient on IV ceftriaxone afterwards.  Constellation of findings are worrisome for complicated UTI/pyelonephritis.  Renal function is  improving.  UA done today revealed specific gravity of 1.016, indicative of possible volume depletion as well.  Procalcitonin is elevated.  Plan is to discharge patient to skilled nursing facility for short-term rehab when patient is medically optimized.  Further management will depend on hospital course.  Subjective: Patient is a poor historian. Reports chills and pain. Reports nausea.  Vomited later. Poor appetite persists. T-max of 100.5 Fahrenheit documented.  Objective: Vitals:   05/13/19 0435 05/13/19 0621 05/13/19 0926 05/13/19 1500  BP: (!) 146/70  140/79 132/70  Pulse: 74  80 76  Resp: 18  18 19   Temp: (!) 100.5 F (38.1 C) 99.5 F (37.5 C) 99 F (37.2 C) 98.8 F (37.1 C)  TempSrc: Oral Oral Oral Oral  SpO2: 93%  96% 95%  Weight:      Height:        Intake/Output Summary (Last 24 hours) at 05/13/2019 1720 Last data filed at 05/13/2019 1714 Gross per 24 hour  Intake 400 ml  Output 700 ml  Net -300 ml   Filed Weights   05/10/19 1331 05/10/19 2113 05/11/19 2031  Weight: 99 kg 98 kg 97.6 kg    Examination:  GENERAL: Not in any distress.  Patient is awake and alert.  Patient has Foley catheter in situ HEENT: Mild pallor.  No jaundice.  Dry buccal mucosa.   NECK: Supple.  No apparent JVD.  RESP: Clear to auscultation. CVS: S1-S2.   ABD: Soft and nontender.  Organs are not palpable. .  EXT: No leg edema. NEURO: Awake and alert.  Patient moves all extremities.    Procedures:  None  Assessment & Plan: Acute kidney injury: -Baseline Cr 0.9-1.0> 4.1 (admit)> 4.72> 4.35> 4.27.  BUN 43> 51> 47> 46.   -Nephrology is directing work-up. -ANCA is negative.   -Avoid nephrotoxic meds 05/12/2019: AKI slowly improving.  Serum creatinine is 3.39 today.  Pursue disposition once cleared for discharge by nephrology. 05/13/2019: Serum creatinine is 2.92 today.  Start IV fluids normal saline 100 cc/h.  Volume depletion: -Start IV fluids.  Complicated  UTI/pyelonephritis/pyuria/hematuria in patient with chronic indwelling Foley catheter due to urine retention/BPH: -Urine culture came back negative -IV antibiotics discontinued -Patient has chronic Foley catheter 05/13/2019: Change Foley catheter.  Urine culture.  Blood culture.  Procalcitonin.  Start IV Rocephin.  Acute metabolic encephalopathy:  -Suspect some underlying cognitive impairment. -Multifactorial-dehydration, AKI,  hyperammonia, low vitamin B12, and tramadol if he has been taking. 05/13/2019: Patient is actually more communicative today.  Improving.  Normocytic anemia:  IDA and ACD. Baseline Hgb 7-9> 9.3 (admit)>> 7.8> 7.8.  Pattern suggests dilution from IV fluid.  Iron sats 9%.  Vitamin B12 low at 142. -Gave IM B12 1000 mcg once.  Continue p.o. -IV iron and ESA per nephrology -Check FOBT to exclude GI bleed.  He is on Brilinta and Eliquis. 05/12/2019: Likely multifactorial.  Continue to monitor and manage accordingly.  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  No focal neuro deficit other than BLE weakness.  He was discharged home from rehab on 2/25. -Continue Eliquis and Brilinta -Continue statin -PT/OT  History of coronary artery disease: -CABG in 2009 -Stable.  Hypertension:  Optimize.   Continue to monitor closely.    Hypothyroidism:  -TSH 6.5 -Continue home Synthroid  Hyponatremia:  Resolved.  Hyperphosphatemia: Likely due to renal failure: -Per nephrology  Debility generalized weakness: -PT/OT  Chronic pain: -Scheduled Tylenol 1 g 3 times daily -Avoid sedating medications  GERD -Continue PPI  Nutrition:  poor by mouth intake and nausea -Dietitian consulted. -Caloric count  Nutrition Problem: Inadequate oral intake Etiology: acute illness, poor appetite  Signs/Symptoms: meal completion < 25%  Interventions: Magic cup, Refer to RD note for  recommendations, Liberalize Diet, Nepro shake   DVT prophylaxis: On Eliquis Code Status: Full code Family Communication:   Discharge barrier: Evaluation and treatment of AKI.  Patient is from: Home Final disposition: To be determined pending clinical improvement and clearance by nephrology  Consultants: Nephrology   Microbiology summarized: [id number] negative Urine culture NGTD  Sch Meds:  Scheduled Meds: . apixaban  5 mg Oral BID  . atorvastatin  80 mg Oral q1800  . Chlorhexidine Gluconate Cloth  6 each Topical Daily  . feeding supplement (NEPRO CARB STEADY)  237 mL Oral BID BM  . influenza vaccine adjuvanted  0.5 mL Intramuscular Tomorrow-1000  . levothyroxine  100 mcg Oral Q0600  . lidocaine  1 application Urethral Once  . pneumococcal 23 valent vaccine  0.5 mL Intramuscular Tomorrow-1000  . sodium bicarbonate  650 mg Oral TID  . ticagrelor  90 mg Oral BID  . vitamin B-12  1,000 mcg Oral Daily   Continuous Infusions: . cefTRIAXone (ROCEPHIN)  IV     PRN Meds:.  Antimicrobials: Anti-infectives (From admission, onward)   Start     Dose/Rate Route Frequency Ordered Stop   05/13/19 1045  cefTRIAXone (ROCEPHIN) 1 g in sodium chloride 0.9 % 100 mL IVPB     1 g 200 mL/hr over 30 Minutes Intravenous Every  24 hours 05/13/19 1036     05/08/19 0130  cefTRIAXone (ROCEPHIN) 1 g in sodium chloride 0.9 % 100 mL IVPB  Status:  Discontinued     1 g 200 mL/hr over 30 Minutes Intravenous Daily at bedtime 05/08/19 0100 05/09/19 0752       I have personally reviewed the following labs and images: CBC: Recent Labs  Lab 05/09/19 0729 05/10/19 0512 05/11/19 0405 05/12/19 0302 05/13/19 0328  WBC 5.0 5.7 9.2 12.0* 14.3*  HGB 7.8* 7.8* 8.1* 8.2* 8.4*  HCT 24.5* 24.9* 25.5* 25.9* 26.8*  MCV 91.4 90.2 88.2 89.9 93.4  PLT 302 327 349 354 375   BMP &GFR Recent Labs  Lab 05/09/19 0729 05/10/19 0512 05/11/19 0405 05/12/19 0302 05/13/19 0328  NA 136 138 137 141 137  K 4.3 4.0  3.7 3.8 4.1  CL 108 108 109 109 107  CO2 18* 20* 18* 21* 18*  GLUCOSE 88 89 95 102* 93  BUN 47* 46* 41* 40* 36*  CREATININE 4.35* 4.27* 3.71* 3.39* 2.92*  CALCIUM 7.7* 7.9* 7.9* 7.9* 8.1*  MG 2.7* 2.8* 2.5* 2.4 2.4  PHOS 4.9* 5.0* 4.4 4.3 3.8   Estimated Creatinine Clearance: 29.7 mL/min (A) (by C-G formula based on SCr of 2.92 mg/dL (H)). Liver & Pancreas: Recent Labs  Lab 05/09/19 0729 05/10/19 0512 05/11/19 0405 05/12/19 0302 05/13/19 0328  ALBUMIN 2.2* 2.2* 2.1* 2.1* 2.1*   No results for input(s): LIPASE, AMYLASE in the last 168 hours. Recent Labs  Lab 05/08/19 0120  AMMONIA 43*   Diabetic: No results for input(s): HGBA1C in the last 72 hours. No results for input(s): GLUCAP in the last 168 hours. Cardiac Enzymes: Recent Labs  Lab 05/08/19 1948  CKTOTAL 78   No results for input(s): PROBNP in the last 8760 hours. Coagulation Profile: No results for input(s): INR, PROTIME in the last 168 hours. Thyroid Function Tests: No results for input(s): TSH, T4TOTAL, FREET4, T3FREE, THYROIDAB in the last 72 hours. Lipid Profile: No results for input(s): CHOL, HDL, LDLCALC, TRIG, CHOLHDL, LDLDIRECT in the last 72 hours. Anemia Panel: No results for input(s): VITAMINB12, FOLATE, FERRITIN, TIBC, IRON, RETICCTPCT in the last 72 hours. Urine analysis:    Component Value Date/Time   COLORURINE YELLOW 05/13/2019 0954   APPEARANCEUR HAZY (A) 05/13/2019 0954   LABSPEC 1.016 05/13/2019 0954   PHURINE 6.0 05/13/2019 0954   GLUCOSEU NEGATIVE 05/13/2019 0954   HGBUR LARGE (A) 05/13/2019 [place] 05/13/2019 [place] 05/13/2019 0954   PROTEINUR 100 (A) 05/13/2019 0954   NITRITE NEGATIVE 05/13/2019 0954   LEUKOCYTESUR LARGE (A) 05/13/2019 0954   Sepsis Labs: Invalid input(s): PROCALCITONIN, [place]  Microbiology: Recent Results (from the past 240 hour(s))  SARS CORONAVIRUS 2 (TAT 6-24 HRS) Nasopharyngeal Nasopharyngeal Swab     Status:  None   Collection Time: 05/07/19  8:48 PM   Specimen: Nasopharyngeal Swab  Result Value Ref Range Status   SARS Coronavirus 2 NEGATIVE NEGATIVE Final    Comment: (NOTE) SARS-CoV-2 target nucleic acids are NOT DETECTED. The SARS-CoV-2 RNA is generally detectable in upper and lower respiratory specimens during the acute phase of infection. Negative results do not preclude SARS-CoV-2 infection, do not rule out co-infections with other pathogens, and should not be used as the sole basis for treatment or other patient management decisions. Negative results must be combined with clinical observations, patient history, and epidemiological information. The expected result is Negative. Fact Sheet for Patients: SugarRoll.be Fact Sheet for  Healthcare Providers: https://www.woods-mathews.com/ This test is not yet approved or cleared by the [place] and  has been authorized for detection and/or diagnosis of SARS-CoV-2 by FDA under an Emergency Use Authorization (EUA). This EUA will remain  in effect (meaning this test can be used) for the duration of the COVID-19 declaration under Section 56 4(b)(1) of the Act, 21 U.S.C. section 360bbb-3(b)(1), unless the authorization is terminated or revoked sooner. Performed at [facility], [place] [address], [place], [place] [address]   Urine culture     Status: None   Collection Time: 05/07/19 11:45 PM   Specimen: Urine, Random  Result Value Ref Range Status   Specimen Description URINE, RANDOM  Final   Special Requests NONE  Final   Culture   Final    NO GROWTH Performed at [facility], [place] [address] [place], [place] [address]    Report Status 05/08/2019 FINAL  Final  Culture, blood (routine x 2)     Status: None   Collection Time: 05/08/19  1:20 AM   Specimen: BLOOD  Result Value Ref Range Status   Specimen Description BLOOD RIGHT ARM  Final   Special Requests   Final    BOTTLES  DRAWN AEROBIC AND ANAEROBIC Blood Culture adequate volume   Culture   Final    NO GROWTH 5 DAYS Performed at [facility], [place] [address], [place], [place] [address]    Report Status 05/13/2019 FINAL  Final  Culture, blood (routine x 2)     Status: None   Collection Time: 05/08/19  1:20 AM   Specimen: BLOOD  Result Value Ref Range Status   Specimen Description BLOOD LEFT ARM  Final   Special Requests   Final    BOTTLES DRAWN AEROBIC AND ANAEROBIC Blood Culture adequate volume   Culture   Final    NO GROWTH 5 DAYS Performed at [facility], [place] [address] [place], [place] [address]    Report Status 05/13/2019 FINAL  Final    Radiology Studies: DG Abd 1 View  Result Date: 05/13/2019 CLINICAL DATA:  Abdominal pain. EXAM: ABDOMEN - 1 VIEW COMPARISON:  March 23, 2019. FINDINGS: The bowel gas pattern is normal. No radio-opaque calculi or other significant radiographic abnormality are seen. IMPRESSION: Negative. Electronically Signed   By: Marijo Conception M.D.   On: 05/13/2019 16:26     Bonnell Public, M.D. [facility]  If 7PM-7AM, please contact night-coverage [email or web address] Password [email or web address] 05/13/2019, 5:20 PM

## 2019-05-13 NOTE — Progress Notes (Signed)
  [facility] Progress Note    Assessment/ Plan:   Pt is a 69 y.o. yo male with HTN, CAD who was admitted on 05/07/2019 with AKI (crt 1.08 on 04/21/19)  Assessment/Plan: 1. Renal-  True AKI from normal crt on 04/21/19.  Left hospital with indwelling foley-  Has stayed in place and changed out per home health on Monday.  Many new meds per cardiology including brilinta and a statin.  U/A showing 100 of prot, white and red blood cells- possible UTI but no growth on culture.  Renal u/s normal with 12 cm kidneys.  Is non oliguric. BP has not been low.  CK 78.  ANA negative, complements WNLand ANCA negative. Protein to crt ratio low. Right now non oliguric and crt trending better.  Will do SPEP/ free light chains, 2. HTN/volume-  Appeared dry if anything-  Had not been eating well-  Was getting IVF -  Now BP is up have stopped 3. Anemia-  Significant-  Low iron stores-  First feraheme dose 3/12, will order second for 3/15.  Have not given ESA 4. Metabolic acidosis - on oral bicarb - improving some 5. Fever-  Per primary- ceftriaxone had been stopped    Subjective:    Cr slightly trending down, serologic workup neg so far.  Getting SPEP/ free light chains.     Objective:   BP 140/79 (BP Location: Right Arm)   Pulse 80   Temp 99 F (37.2 C) (Oral)   Resp 18   Ht 6\' 5"  (1.956 m)   Wt 97.6 kg   SpO2 96%   BMI 25.52 kg/m   Intake/Output Summary (Last 24 hours) at 05/13/2019 1233 Last data filed at 05/13/2019 0601 Gross per 24 hour  Intake 200 ml  Output 725 ml  Net -525 ml   Weight change:   Physical Exam: Gen: sleeping CVS: RRR Resp: clear Abd: soft Ext:no LE edema  Imaging: No results found.  Labs: BMET Recent Labs  Lab 05/08/19 0230 05/08/19 1948 05/09/19 0729 05/10/19 0512 05/11/19 0405 05/12/19 0302 05/13/19 0328  NA 134* 135 136 138 137 141 137  K 4.7 4.2 4.3 4.0 3.7 3.8 4.1  CL 102 107 108 108 109 109 107  CO2 20* 16* 18* 20* 18* 21* 18*  GLUCOSE  123* 120* 88 89 95 102* 93  BUN 47* 51* 47* 46* 41* 40* 36*  CREATININE 4.59* 4.72* 4.35* 4.27* 3.71* 3.39* 2.92*  CALCIUM 8.5* 7.9* 7.7* 7.9* 7.9* 7.9* 8.1*  PHOS  --  4.9* 4.9* 5.0* 4.4 4.3 3.8   CBC Recent Labs  Lab 05/10/19 0512 05/11/19 0405 05/12/19 0302 05/13/19 0328  WBC 5.7 9.2 12.0* 14.3*  HGB 7.8* 8.1* 8.2* 8.4*  HCT 24.9* 25.5* 25.9* 26.8*  MCV 90.2 88.2 89.9 93.4  PLT 327 349 354 375    Medications:    . apixaban  5 mg Oral BID  . atorvastatin  80 mg Oral q1800  . Chlorhexidine Gluconate Cloth  6 each Topical Daily  . feeding supplement (NEPRO CARB STEADY)  237 mL Oral BID BM  . influenza vaccine adjuvanted  0.5 mL Intramuscular Tomorrow-1000  . levothyroxine  100 mcg Oral Q0600  . pneumococcal 23 valent vaccine  0.5 mL Intramuscular Tomorrow-1000  . sodium bicarbonate  650 mg Oral TID  . ticagrelor  90 mg Oral BID  . vitamin B-12  1,000 mcg Oral Daily      Madelon Lips, MD

## 2019-05-13 NOTE — Care Management (Signed)
Spoke w wife over the phone. Tentative DC plan will be for home w [facility] services. Patient was active with [facility] services prior to DC. Wife is agreeable to resume services.  Patient has RW, WC, 3/1, shower seat at home.  Wife is requesting hospital bed. Order and note placed. Discussed with Lelon Perla [phone]. Will hold off on delivery for now.   After speaking with wife, I spoke w MD who states that patient may be more suited for SNF, and he was going to speak w the wife shortly.  Update from MD- She is agreeable to ST SNF.   Patient with low grade fever, worsening leuks and MD to start on Rocephin. TOC to continue to follow

## 2019-05-13 NOTE — Progress Notes (Signed)
Physical Therapy Treatment Patient Details Name: Jeremy Sherman MRN: [id number] DOB: 01-23-1951 Today's Date: 05/13/2019    History of Present Illness 69 year old male admitted to ED on 3/10 with AKI, AMS, R chest wall pain. Pt with foley catheter use x2 weeks. Pt with recent hospitalization for CVA and aneurysm repair, d/c from CIR on 04/21/2019. 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    PT Comments    Pt required very increased time and effort to mobilize today, taking most time for bed mobility and preparing for standing. Pt required min-mod assist for mobility today, with multimodal cuing provided for safety and command following. PT spoke with Dr. Marthenia Rolling, and MD, case management, and pt's family are all in agreement that short term SNF is most appropriate d/c option at this time. Given pt's continued physical assist, safety awareness issues, and slow mobility progress, ST-SNF is an appropriate d/c option at this time. PT to continue to follow acutely.    Follow Up Recommendations  SNF     Equipment Recommendations  Hospital bed    Recommendations for Other Services       Precautions / Restrictions Precautions Precautions: Fall Restrictions Weight Bearing Restrictions: No    Mobility  Bed Mobility Overal bed mobility: Needs Assistance Bed Mobility: Supine to Sit     Supine to sit: Min assist;HOB elevated     General bed mobility comments: Min assist for progression of LEs to EOB, pt able to pull trunk to upright with use of bedrails and HOB elevation. Increased time and effort to scoot to EOB.  Transfers Overall transfer level: Needs assistance Equipment used: Rolling walker (2 wheeled) Transfers: Sit to/from Stand Sit to Stand: Mod assist;From elevated surface         General transfer comment: Mod assist for  initial power up, steadying while pt rises. Increased time, verbal cuing for hand placement when rising.  Ambulation/Gait Ambulation/Gait assistance: Min assist Gait Distance (Feet): 55 Feet Assistive device: Rolling walker (2 wheeled) Gait Pattern/deviations: Step-through pattern;Decreased stride length;Trunk flexed;Drifts right/left Gait velocity: decr   General Gait Details: Min assist for steadying, guiding pt and RW during hallway navigation. Verbal cuing for placement in RW especially with direction changes.   Stairs             Wheelchair Mobility    Modified Rankin (Stroke Patients Only)       Balance Overall balance assessment: Needs assistance Sitting-balance support: Feet supported Sitting balance-Leahy Scale: Fair     Standing balance support: Bilateral upper extremity supported;During functional activity Standing balance-Leahy Scale: Poor                              Cognition Arousal/Alertness: Awake/alert Behavior During Therapy: WFL for tasks assessed/performed Overall Cognitive Status: Impaired/Different from baseline Area of Impairment: Orientation;Attention;Following commands;Safety/judgement;Awareness;Problem solving                 Orientation Level: Disoriented to;Time;Situation Current Attention Level: Sustained   Following Commands: Follows one step commands with increased time;Follows one step commands consistently Safety/Judgement: Decreased awareness of safety;Decreased awareness of deficits Awareness: Intellectual Problem Solving: Slow processing;Decreased initiation;Requires verbal cues General Comments: Increased time and effort to mobilize, requires repeated verbal and tactile cuing at times for mobility. Pt keeps saying "are they gonna let me go home tomorrow?"      Exercises General  Exercises - Lower Extremity Ankle Circles/Pumps: AROM;Both;10 reps;Seated    General Comments        Pertinent Vitals/Pain  Pain Assessment: Faces Faces Pain Scale: Hurts even more Pain Location: R side chest wall Pain Descriptors / Indicators: Guarding;Grimacing;Moaning Pain Intervention(s): Limited activity within patient's tolerance;Monitored during session;Repositioned    Home Living                      Prior Function            PT Goals (current goals can now be found in the care plan section) Acute Rehab PT Goals Patient Stated Goal: get home with wife PT Goal Formulation: With patient Time For Goal Achievement: 05/22/19 Potential to Achieve Goals: Fair Progress towards PT goals: Progressing toward goals    Frequency    Min 2X/week      PT Plan Discharge plan needs to be updated    Co-evaluation              AM-PAC PT "6 Clicks" Mobility   Outcome Measure  Help needed turning from your back to your side while in a flat bed without using bedrails?: A Little Help needed moving from lying on your back to sitting on the side of a flat bed without using bedrails?: A Lot Help needed moving to and from a bed to a chair (including a wheelchair)?: A Lot Help needed standing up from a chair using your arms (e.g., wheelchair or bedside chair)?: A Lot Help needed to walk in hospital room?: A Little Help needed climbing 3-5 steps with a railing? : A Lot 6 Click Score: 14    End of Session Equipment Utilized During Treatment: Gait belt Activity Tolerance: Patient limited by fatigue Patient left: in chair;with call bell/phone within reach;with chair alarm set;with nursing/sitter in room Nurse Communication: Mobility status;Other (comment)(L IV site bleeding) PT Visit Diagnosis: Other abnormalities of gait and mobility (R26.89);Muscle weakness (generalized) (M62.81)     Time: [id number] PT Time Calculation (min) (ACUTE ONLY): 28 min  Charges:  $Gait Training: 8-22 mins $Therapeutic Activity: 8-22 mins                     Tylyn Derwin E, [place] Pager  [phone]  Office [phone]    Jadalyn Oliveri D Elonda Husky 05/13/2019, 2:53 PM

## 2019-05-13 NOTE — Care Management (Cosign Needed)
    Durable Medical Equipment  (From admission, onward)         Start     Ordered   Unscheduled  For home use only DME Hospital bed  Once    Question Answer Comment  Length of Need Lifetime   Patient has (list medical condition): Acute Kidney Injury, weakness, chronic indwelling urinary catheter, confusion   The above medical condition requires: Patient requires the ability to reposition frequently   Head must be elevated greater than: 30 degrees   Bed type Semi-electric   Support Surface: Gel Overlay      05/13/19 1025

## 2019-05-13 NOTE — Progress Notes (Signed)
[facility] Progress Note    Assessment/ Plan:   Pt is a 69 y.o. yo male with HTN, CAD who was admitted on 05/07/2019 with AKI (crt 1.08 on 04/21/19)  Assessment/Plan: 1. Renal-  True AKI from normal crt on 04/21/19.  Left hospital with indwelling foley-  Has stayed in place and changed out per home health on Monday.  Many new meds per cardiology including brilinta and a statin.  U/A showing 100 of prot, white and red blood cells- possible UTI but no growth on culture.  Renal u/s normal with 12 cm kidneys.  Is non oliguric. BP has not been low.  CK 78.  ANA negative, complements WNLand ANCA negative. Protein to crt ratio low. Right now non oliguric and crt trending better.  Will do SPEP/ free light chains.  Don't think biopsy warranted now, looks frail like would not do well with IS.   2.  Fever/ vomiting: UA/ culture redrawn, blood cultures done, restarted ceftriaxone. Will order AXR. Per RN, no BM sincd 3/10 3. HTN/volume-  Appeared dry if anything-  Had not been eating well-  Was getting IVF -  Now BP is up have stopped 4 Anemia-  Significant-  Low iron stores-  First feraheme dose 3/12, s/p [id number].   5 Metabolic acidosis - on oral bicarb - improving some   Subjective:    Seen in room- had a fever again, threw up this AM.  Blood cultures drawn, UA and culture done.  Cr down to 2.9 this AM.   Objective:   BP 140/79 (BP Location: Right Arm)   Pulse 80   Temp 99 F (37.2 C) (Oral)   Resp 18   Ht 6\' 5"  (1.956 m)   Wt 97.6 kg   SpO2 96%   BMI 25.52 kg/m   Intake/Output Summary (Last 24 hours) at 05/13/2019 1236 Last data filed at 05/13/2019 0601 Gross per 24 hour  Intake 200 ml  Output 725 ml  Net -525 ml   Weight change:   Physical Exam: Gen: sitting in bed, getting cleaned up by nursing CVS: RRR Resp: clear Abd: soft, hypoactive bowel sounds, mildly diffusely tender Ext:no LE edema  Imaging: No results found.  Labs: BMET Recent Labs  Lab 05/08/19 0230  05/08/19 1948 05/09/19 0729 05/10/19 0512 05/11/19 0405 05/12/19 0302 05/13/19 0328  NA 134* 135 136 138 137 141 137  K 4.7 4.2 4.3 4.0 3.7 3.8 4.1  CL 102 107 108 108 109 109 107  CO2 20* 16* 18* 20* 18* 21* 18*  GLUCOSE 123* 120* 88 89 95 102* 93  BUN 47* 51* 47* 46* 41* 40* 36*  CREATININE 4.59* 4.72* 4.35* 4.27* 3.71* 3.39* 2.92*  CALCIUM 8.5* 7.9* 7.7* 7.9* 7.9* 7.9* 8.1*  PHOS  --  4.9* 4.9* 5.0* 4.4 4.3 3.8   CBC Recent Labs  Lab 05/10/19 0512 05/11/19 0405 05/12/19 0302 05/13/19 0328  WBC 5.7 9.2 12.0* 14.3*  HGB 7.8* 8.1* 8.2* 8.4*  HCT 24.9* 25.5* 25.9* 26.8*  MCV 90.2 88.2 89.9 93.4  PLT 327 349 354 375    Medications:    . apixaban  5 mg Oral BID  . atorvastatin  80 mg Oral q1800  . Chlorhexidine Gluconate Cloth  6 each Topical Daily  . feeding supplement (NEPRO CARB STEADY)  237 mL Oral BID BM  . influenza vaccine adjuvanted  0.5 mL Intramuscular Tomorrow-1000  . levothyroxine  100 mcg Oral Q0600  . pneumococcal 23 valent vaccine  0.5  mL Intramuscular Tomorrow-1000  . sodium bicarbonate  650 mg Oral TID  . ticagrelor  90 mg Oral BID  . vitamin B-12  1,000 mcg Oral Daily      Madelon Lips, MD

## 2019-05-14 LAB — PROTEIN ELECTROPHORESIS, SERUM
A/G Ratio: 0.6 — ABNORMAL LOW (ref 0.7–1.7)
Albumin ELP: 2.3 g/dL — ABNORMAL LOW (ref 2.9–4.4)
Alpha-1-Globulin: 0.5 g/dL — ABNORMAL HIGH (ref 0.0–0.4)
Alpha-2-Globulin: 0.8 g/dL (ref 0.4–1.0)
Beta Globulin: 0.7 g/dL (ref 0.7–1.3)
Gamma Globulin: 1.9 g/dL — ABNORMAL HIGH (ref 0.4–1.8)
Globulin, Total: 3.8 g/dL (ref 2.2–3.9)
M-Spike, %: 1 g/dL — ABNORMAL HIGH
Total Protein ELP: 6.1 g/dL (ref 6.0–8.5)

## 2019-05-14 LAB — RENAL FUNCTION PANEL
Albumin: 1.9 g/dL — ABNORMAL LOW (ref 3.5–5.0)
Anion gap: 10 (ref 5–15)
BUN: 33 mg/dL — ABNORMAL HIGH (ref 8–23)
CO2: 20 mmol/L — ABNORMAL LOW (ref 22–32)
Calcium: 8.1 mg/dL — ABNORMAL LOW (ref 8.9–10.3)
Chloride: 107 mmol/L (ref 98–111)
Creatinine, Ser: 2.79 mg/dL — ABNORMAL HIGH (ref 0.61–1.24)
GFR calc Af Amer: 25 mL/min — ABNORMAL LOW (ref >60)
GFR calc non Af Amer: 22 mL/min — ABNORMAL LOW (ref >60)
Glucose, Bld: 95 mg/dL (ref 70–99)
Phosphorus: 4 mg/dL (ref 2.5–4.6)
Potassium: 3.9 mmol/L (ref 3.5–5.1)
Sodium: 137 mmol/L (ref 135–145)

## 2019-05-14 LAB — URINE CULTURE: Culture: NO GROWTH

## 2019-05-14 MED ORDER — HYDROCODONE-ACETAMINOPHEN 5-325 MG PO TABS
1.0000 | ORAL_TABLET | Freq: Once | ORAL | Status: AC | PRN
  Administered 2019-05-18: 1 via ORAL
  Filled 2019-05-14: qty 1

## 2019-05-14 NOTE — Progress Notes (Signed)
Occupational Therapy Treatment Patient Details Name: Jeremy Sherman MRN: [id number] DOB: 08/26/1950 Today's Date: 05/14/2019    History of present illness 69 year old male admitted to ED on 3/10 with AKI, AMS, R chest wall pain. Pt with foley catheter use x2 weeks. Pt with recent hospitalization for CVA and aneurysm repair, d/c from CIR on 04/21/2019. 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   OT comments  Pt progressing with OT goals gradually. Pt received in bed, resting and initially lethargic. After setup for washing face and drinking juice from cup, pt became more alert. Pt did demo decreased coordination with drinking, difficulty manipulating objects and straw to mouth. Pt Min A for bed mobility to sit EOB with increased time and HOB elevated. Pt Min A for sit to stand from elevated bed (pt with very tall stature), Min A to gain initial standing balance, progressing to Min guard for stand pivot to recliner chair. Pt with moaning during movement, citing soreness "all over", only able to specify right abdomen soreness. DC recommendations updated to SNF for short term rehab to improve overall safety and decrease caregiver burden before return home. Will continue to follow acutely.    Follow Up Recommendations  SNF;Supervision/Assistance - 24 hour    Equipment Recommendations  Hospital bed    Recommendations for Other Services      Precautions / Restrictions Precautions Precautions: Fall Restrictions Weight Bearing Restrictions: No       Mobility Bed Mobility Overal bed mobility: Needs Assistance Bed Mobility: Supine to Sit     Supine to sit: Min assist;HOB elevated     General bed mobility comments: MIn A to advance LE and cues for use of bedrail, provided light HHA for trunk stability/advancement  Transfers Overall transfer  level: Needs assistance Equipment used: Rolling walker (2 wheeled) Transfers: Sit to/from [facility] Sit to Stand: Min assist;From elevated surface(elevated surface due to tall stature) Stand pivot transfers: Min guard;Min assist       General transfer comment: Min A for initial sit to stand to gain initial balance, progressed to Min guard for pivot/mobility     Balance Overall balance assessment: Needs assistance Sitting-balance support: Feet supported;Bilateral upper extremity supported;Single extremity supported Sitting balance-Leahy Scale: Fair     Standing balance support: Bilateral upper extremity supported;During functional activity Standing balance-Leahy Scale: Fair Standing balance comment: Cues for posture correction, Min A initially progressing to Min guard                            ADL either performed or assessed with clinical judgement   ADL Overall ADL's : Needs assistance/impaired Eating/Feeding: Set up;Sitting;Bed level Eating/Feeding Details (indicate cue type and reason): Setup with cues needed for coordination, pt missed mouth with straw  Grooming: Set up;Wash/dry face;Sitting Grooming Details (indicate cue type and reason): Setup to wash face, as pt initially resting and lethargic on OT entry                             Functional mobility during ADLs: Min guard;Rolling walker;Cueing for sequencing       Vision       Perception     Praxis      Cognition Arousal/Alertness: Awake/alert Behavior During Therapy: WFL for tasks assessed/performed Overall Cognitive Status: Impaired/Different from baseline Area of Impairment:  Orientation;Memory                 Orientation Level: Time Current Attention Level: Sustained                    Exercises     Shoulder Instructions       General Comments      Pertinent Vitals/ Pain       Pain Assessment: Faces Faces Pain Scale: Hurts little  more Pain Location: R side of abdomen. pt also reports pain/soreness "all over" when moving Pain Descriptors / Indicators: Guarding;Grimacing;Moaning Pain Intervention(s): Limited activity within patient's tolerance;Monitored during session  Home Living                                          Prior Functioning/Environment              Frequency  Min 2X/week        Progress Toward Goals  OT Goals(current goals can now be found in the care plan section)  Progress towards OT goals: Progressing toward goals  Acute Rehab OT Goals Patient Stated Goal: get home with wife OT Goal Formulation: With patient/family Time For Goal Achievement: 05/23/19 Potential to Achieve Goals: Good ADL Goals Pt Will Perform Lower Body Bathing: with min assist;sit to/from stand Pt Will Perform Lower Body Dressing: with min assist;sit to/from stand Pt Will Transfer to Toilet: ambulating;with supervision;bedside commode Pt Will Perform Toileting - Clothing Manipulation and hygiene: with min assist;sit to/from stand Additional ADL Goal #1: Pt/caregiver will verbalize 3 strategies to increase safety and reduce risk of falls  Plan Discharge plan needs to be updated    Co-evaluation                 AM-PAC OT "6 Clicks" Daily Activity     Outcome Measure   Help from another person eating meals?: None Help from another person taking care of personal grooming?: A Little Help from another person toileting, which includes using toliet, bedpan, or urinal?: A Lot Help from another person bathing (including washing, rinsing, drying)?: A Lot Help from another person to put on and taking off regular upper body clothing?: A Little Help from another person to put on and taking off regular lower body clothing?: A Lot 6 Click Score: 16    End of Session Equipment Utilized During Treatment: Rolling walker  OT Visit Diagnosis: Unsteadiness on feet (R26.81);Other abnormalities of gait  and mobility (R26.89);Muscle weakness (generalized) (M62.81);Ataxia, unspecified (R27.0);Other symptoms and signs involving cognitive function;Pain Pain - Right/Left: Right Pain - part of body: (abdomen)   Activity Tolerance Patient tolerated treatment well   Patient Left in chair;with call bell/phone within reach;with chair alarm set   Nurse Communication Mobility status        Time: [id number] OT Time Calculation (min): 32 min  Charges: OT General Charges $OT Visit: 1 Visit OT Treatments $Self Care/Home Management : 8-22 mins $Therapeutic Activity: 8-22 mins  Layla Maw, OTR/L   Layla Maw 05/14/2019, 9:59 AM

## 2019-05-14 NOTE — Progress Notes (Signed)
[facility] Progress Note    Assessment/ Plan:   Pt is a 69 y.o. yo male with HTN, CAD who was admitted on 05/07/2019 with AKI (crt 1.08 on 04/21/19)  Assessment/Plan: 1. Renal-  True AKI from normal crt on 04/21/19.  Left hospital with indwelling foley-  Has stayed in place and changed out per home health on Monday.  Many new meds per cardiology including brilinta and a statin.  U/A showing 100 of prot, white and red blood cells- possible UTI but no growth on culture.  Renal u/s normal with 12 cm kidneys.  Is non oliguric. BP has not been low.  CK 78.  ANA negative, complements WNLand ANCA negative. Protein to crt ratio low. Right now non oliguric and crt trending better.  Will do SPEP/ free light chains--> pending  Don't think biopsy warranted now, looks frail like would not do well with IS.   2.  Fever/ vomiting: UA/ culture redrawn, blood cultures done, restarted ceftriaxone. AXR--> negative Per RN, no BM sincd 3/10 3. HTN/volume-  Appeared dry if anything-  Had not been eating well-  Was getting IVF -  Now BP is up have stopped 4 Anemia-  Significant-  Low iron stores-  First feraheme dose 3/12, s/p [id number].   5 Metabolic acidosis - on oral bicarb - improving some 6.  Dispo: OK with discharge whenever ready.   Subjective:    Feeling better today.  Back on cefriaxone.  Repeat cultures pending.   Objective:   BP (!) 155/80 (BP Location: Left Arm)   Pulse 72   Temp 99.9 F (37.7 C) (Oral)   Resp 20   Ht 6\' 5"  (1.956 m)   Wt 97.9 kg   SpO2 98%   BMI 25.59 kg/m   Intake/Output Summary (Last 24 hours) at 05/14/2019 1134 Last data filed at 05/14/2019 0900 Gross per 24 hour  Intake 1823.63 ml  Output 1650 ml  Net 173.63 ml   Weight change:   Physical Exam: Gen: sitting in bed, getting cleaned up by nursing CVS: RRR Resp: clear Abd: soft, hypoactive bowel sounds, mildly diffusely tender Ext:no LE edema  Imaging: DG Abd 1 View  Result Date: 05/13/2019 CLINICAL  DATA:  Abdominal pain. EXAM: ABDOMEN - 1 VIEW COMPARISON:  March 23, 2019. FINDINGS: The bowel gas pattern is normal. No radio-opaque calculi or other significant radiographic abnormality are seen. IMPRESSION: Negative. Electronically Signed   By: Marijo Conception M.D.   On: 05/13/2019 16:26    Labs: BMET Recent Labs  Lab 05/08/19 1948 05/09/19 0729 05/10/19 0512 05/11/19 0405 05/12/19 0302 05/13/19 0328 05/14/19 0247  NA 135 136 138 137 141 137 137  K 4.2 4.3 4.0 3.7 3.8 4.1 3.9  CL 107 108 108 109 109 107 107  CO2 16* 18* 20* 18* 21* 18* 20*  GLUCOSE 120* 88 89 95 102* 93 95  BUN 51* 47* 46* 41* 40* 36* 33*  CREATININE 4.72* 4.35* 4.27* 3.71* 3.39* 2.92* 2.79*  CALCIUM 7.9* 7.7* 7.9* 7.9* 7.9* 8.1* 8.1*  PHOS 4.9* 4.9* 5.0* 4.4 4.3 3.8 4.0   CBC Recent Labs  Lab 05/10/19 0512 05/11/19 0405 05/12/19 0302 05/13/19 0328  WBC 5.7 9.2 12.0* 14.3*  HGB 7.8* 8.1* 8.2* 8.4*  HCT 24.9* 25.5* 25.9* 26.8*  MCV 90.2 88.2 89.9 93.4  PLT 327 349 354 375    Medications:    . apixaban  5 mg Oral BID  . atorvastatin  80 mg Oral q1800  .  Chlorhexidine Gluconate Cloth  6 each Topical Daily  . feeding supplement (NEPRO CARB STEADY)  237 mL Oral BID BM  . influenza vaccine adjuvanted  0.5 mL Intramuscular Tomorrow-1000  . levothyroxine  100 mcg Oral Q0600  . lidocaine  1 application Urethral Once  . pneumococcal 23 valent vaccine  0.5 mL Intramuscular Tomorrow-1000  . sodium bicarbonate  650 mg Oral TID  . ticagrelor  90 mg Oral BID  . vitamin B-12  1,000 mcg Oral Daily      Madelon Lips, MD

## 2019-05-14 NOTE — Progress Notes (Signed)
PROGRESS NOTE    Jeremy Sherman  [id number] DOB: 12-Oct-1950 DOA: 05/07/2019 PCP: Imagene Riches, NP   Brief Narrative:  HPI On 05/08/2019 by Dr. Shela Leff Jeremy Sherman is a 69 y.o. male with medical history significant of CAD status post CABG, hypertension, hyperlipidemia, hypothyroidism, prediabetes, recent hospital admission for stroke, carotid artery disease status post stent on Eliquis and Brilinta, left atrial appendage thrombus presenting to the ED for evaluation of abnormal labs.  Patient was called by his physician after outpatient labs revealed worsening renal failure.  Patient appears confused and is not sure why he is here.  He has no complaints.  Denies fevers, chills, chest pain, cough, shortness of breath, nausea, vomiting, abdominal pain, diarrhea, flank pain, or dysuria.  No family available at this time.  Per ED provider's conversation with the patient's wife since his discharge from rehab 2 weeks ago he has had slowly worsening confusion.  He has an indwelling Foley catheter since his prior hospitalization which has been draining urine.  Interim history Patient found to have acute kidney injury and nephrology consulted and following.  Also developed fever with worsening leukocytosis, work-up thus far has been unremarkable.  There was initial concern of chordee when he was first admitted however repeat urine cultures pending. Assessment & Plan   Acute kidney injury -Creatinine on admission 4.72 (baseline approximately 1) -Today creatinine down to 2.79 -Nephrology consulted and appreciated -Renal ultrasound unremarkable -ANCA negative -Patient was placed on IV fluids -Continue to monitor BMP  Complicated UTI/pyelonephritis/pyuria with hematuria in a patient with chronic indwelling Foley catheter due to urinary retention and BPH -Urine culture initially unremarkable.  Repeat urine culture on 05/13/2019 pending -Foley catheter was changed on  05/13/2019 -Blood cultures show no growth to date -patient was placed on IV ceftriaxone   Acute metabolic encephalopathy -Suspect some underlying cognitive impairment -Multifactorial including dehydration, AKI, hyperammonemia, low B12, tramadol use -Appears to be improving, although unsure patient's baseline  Normocytic anemia -Certainly iron deficiency anemia versus anemia of chronic disease -Hemoglobin currently 8.4 -Patient's hemoglobin did drop to 7.8, likely dilutional from IV fluid -Iron saturation 9, B12 142 -Was given B12 supplementation along with IV iron and ESA per nephrology -FOBT pending -Of note, patient on Brilinta and Eliquis  History of CVA -Left CAS/left MCA stenosis status post left CEA and left MCA stent in January 2021.  On Brilinta.  Patient also noted to have a left atrial appendage thrombus was placed on Eliquis. -Currently no focal neuro deficits other than lower extremity bilateral weakness.  Patient was discharged home from rehab on 04/24/2019. -Continue Brilinta, Eliquis, statin -PT, OT recommending SNF  History of coronary artery disease -Patient with CABG in 2019 -Currently stable, no complaints of chest pain  Essential hypertension   Hypothyroidism -Continue Synthroid -TSH 6.5- however this was a hemolyzed sample  Hyponatremia -Resolved  Hyperphosphatemia -Likely due to renal failure -Treatment per nephrology  Generalized weakness with debility -PT and OT as above  Chronic pain -Continue scheduled Tylenol, avoid sedating medications   GERD -Continue PPI  DVT Prophylaxis Eliquis  Code Status: Full  Family Communication: None at bedside  Disposition Plan: Admitted from home for acute metabolic encephalopathy along with acute kidney injury.  Pending SNF placement.  Patient did develop fever, pending work-up.  Suspect disposition to SNF in 1 to 2 days.  Consultants Nephrology  Procedures  Renal ultrasound  Antibiotics     Anti-infectives (From admission, onward)   Start  Dose/Rate Route Frequency Ordered Stop   05/13/19 1045  cefTRIAXone (ROCEPHIN) 1 g in sodium chloride 0.9 % 100 mL IVPB     1 g 200 mL/hr over 30 Minutes Intravenous Every 24 hours 05/13/19 1036     05/08/19 0130  cefTRIAXone (ROCEPHIN) 1 g in sodium chloride 0.9 % 100 mL IVPB  Status:  Discontinued     1 g 200 mL/hr over 30 Minutes Intravenous Daily at bedtime 05/08/19 0100 05/09/19 [id number]      Subjective:   Jeremy Sherman seen and examined today.  Patient was asking for water this morning.  States that he is feeling better than previous days but does not feel ready to leave.  Denies current chest pain or shortness of breath, abdominal pain, current nausea or vomiting, dizziness or headache.  Objective:   Vitals:   05/13/19 1500 05/13/19 2059 05/13/19 2235 05/14/19 0517  BP: 132/70 (!) 146/59  (!) 149/75  Pulse: 76 80  76  Resp: 19 20  18   Temp: 98.8 F (37.1 C) (!) 101.1 F (38.4 C) 99.5 F (37.5 C) 98.9 F (37.2 C)  TempSrc: Oral Oral Oral Oral  SpO2: 95% 98%  98%  Weight:  97.9 kg    Height:        Intake/Output Summary (Last 24 hours) at 05/14/2019 1009 Last data filed at 05/14/2019 0601 Gross per 24 hour  Intake 1703.63 ml  Output 1650 ml  Net 53.63 ml   Filed Weights   05/10/19 2113 05/11/19 2031 05/13/19 2059  Weight: 98 kg 97.6 kg 97.9 kg    Exam  General: Well developed, chronically ill-appearing, NAD  HEENT: NCAT, mucous membranes moist.   Cardiovascular: S1 S2 auscultated, RRR  Respiratory: Clear to auscultation bilaterally with equal chest rise  Abdomen: Soft, mildly TTP, nondistended, + bowel sounds  Extremities: warm dry without cyanosis clubbing or edema  Neuro: AAOx2 (self, place), nonfocal  Psych: Appropriate   Data Reviewed: I have personally reviewed following labs and imaging studies  CBC: Recent Labs  Lab 05/09/19 0729 05/10/19 0512 05/11/19 0405 05/12/19 0302  05/13/19 0328  WBC 5.0 5.7 9.2 12.0* 14.3*  HGB 7.8* 7.8* 8.1* 8.2* 8.4*  HCT 24.5* 24.9* 25.5* 25.9* 26.8*  MCV 91.4 90.2 88.2 89.9 93.4  PLT 302 327 349 354 [id number]   Basic Metabolic Panel: Recent Labs  Lab 05/09/19 0729 05/09/19 0729 05/10/19 0512 05/11/19 0405 05/12/19 0302 05/13/19 0328 05/14/19 0247  NA 136   < > 138 137 141 137 137  K 4.3   < > 4.0 3.7 3.8 4.1 3.9  CL 108   < > 108 109 109 107 107  CO2 18*   < > 20* 18* 21* 18* 20*  GLUCOSE 88   < > 89 95 102* 93 95  BUN 47*   < > 46* 41* 40* 36* 33*  CREATININE 4.35*   < > 4.27* 3.71* 3.39* 2.92* 2.79*  CALCIUM 7.7*   < > 7.9* 7.9* 7.9* 8.1* 8.1*  MG 2.7*  --  2.8* 2.5* 2.4 2.4  --   PHOS 4.9*   < > 5.0* 4.4 4.3 3.8 4.0   < > = values in this interval not displayed.   GFR: Estimated Creatinine Clearance: 31 mL/min (A) (by C-G formula based on SCr of 2.79 mg/dL (H)). Liver Function Tests: Recent Labs  Lab 05/10/19 0512 05/11/19 0405 05/12/19 0302 05/13/19 0328 05/14/19 0247  ALBUMIN 2.2* 2.1* 2.1* 2.1* 1.9*   No results for input(s):  LIPASE, AMYLASE in the last 168 hours. Recent Labs  Lab 05/08/19 0120  AMMONIA 43*   Coagulation Profile: No results for input(s): INR, PROTIME in the last 168 hours. Cardiac Enzymes: Recent Labs  Lab 05/08/19 1948  CKTOTAL 78   BNP (last 3 results) No results for input(s): PROBNP in the last 8760 hours. HbA1C: No results for input(s): HGBA1C in the last 72 hours. CBG: No results for input(s): GLUCAP in the last 168 hours. Lipid Profile: No results for input(s): CHOL, HDL, LDLCALC, TRIG, CHOLHDL, LDLDIRECT in the last 72 hours. Thyroid Function Tests: No results for input(s): TSH, T4TOTAL, FREET4, T3FREE, THYROIDAB in the last 72 hours. Anemia Panel: No results for input(s): VITAMINB12, FOLATE, FERRITIN, TIBC, IRON, RETICCTPCT in the last 72 hours. Urine analysis:    Component Value Date/Time   COLORURINE YELLOW 05/13/2019 0954   APPEARANCEUR HAZY (A) 05/13/2019  0954   LABSPEC 1.016 05/13/2019 0954   PHURINE 6.0 05/13/2019 0954   GLUCOSEU NEGATIVE 05/13/2019 0954   HGBUR LARGE (A) 05/13/2019 0954   BILIRUBINUR NEGATIVE 05/13/2019 [place] 05/13/2019 0954   PROTEINUR 100 (A) 05/13/2019 0954   NITRITE NEGATIVE 05/13/2019 0954   LEUKOCYTESUR LARGE (A) 05/13/2019 0954   Sepsis Labs: @LABRCNTIP (procalcitonin:4,lacticidven:4)  ) Recent Results (from the past 240 hour(s))  SARS CORONAVIRUS 2 (TAT 6-24 HRS) Nasopharyngeal Nasopharyngeal Swab     Status: None   Collection Time: 05/07/19  8:48 PM   Specimen: Nasopharyngeal Swab  Result Value Ref Range Status   SARS Coronavirus 2 NEGATIVE NEGATIVE Final    Comment: (NOTE) SARS-CoV-2 target nucleic acids are NOT DETECTED. The SARS-CoV-2 RNA is generally detectable in upper and lower respiratory specimens during the acute phase of infection. Negative results do not preclude SARS-CoV-2 infection, do not rule out co-infections with other pathogens, and should not be used as the sole basis for treatment or other patient management decisions. Negative results must be combined with clinical observations, patient history, and epidemiological information. The expected result is Negative. Fact Sheet for Patients: SugarRoll.be Fact Sheet for Healthcare Providers: https://www.woods-mathews.com/ This test is not yet approved or cleared by the [place] FDA and  has been authorized for detection and/or diagnosis of SARS-CoV-2 by FDA under an Emergency Use Authorization (EUA). This EUA will remain  in effect (meaning this test can be used) for the duration of the COVID-19 declaration under Section 56 4(b)(1) of the Act, 21 U.S.C. section 360bbb-3(b)(1), unless the authorization is terminated or revoked sooner. Performed at [facility], [place] [address], [place], [place] [address]   Urine culture     Status: None   Collection Time: 05/07/19  11:45 PM   Specimen: Urine, Random  Result Value Ref Range Status   Specimen Description URINE, RANDOM  Final   Special Requests NONE  Final   Culture   Final    NO GROWTH Performed at [facility], [place] [address], [place], [place] [address]    Report Status 05/08/2019 FINAL  Final  Culture, blood (routine x 2)     Status: None   Collection Time: 05/08/19  1:20 AM   Specimen: BLOOD  Result Value Ref Range Status   Specimen Description BLOOD RIGHT ARM  Final   Special Requests   Final    BOTTLES DRAWN AEROBIC AND ANAEROBIC Blood Culture adequate volume   Culture   Final    NO GROWTH 5 DAYS Performed at [facility], [place],  [place] [address]    Report Status 05/13/2019 FINAL  Final  Culture, blood (routine x 2)     Status: None   Collection Time: 05/08/19  1:20 AM   Specimen: BLOOD  Result Value Ref Range Status   Specimen Description BLOOD LEFT ARM  Final   Special Requests   Final    BOTTLES DRAWN AEROBIC AND ANAEROBIC Blood Culture adequate volume   Culture   Final    NO GROWTH 5 DAYS Performed at [facility], [address], [place], [place] [address]    Report Status 05/13/2019 FINAL  Final  Culture, blood (routine x 2)     Status: None (Preliminary result)   Collection Time: 05/13/19 10:04 AM   Specimen: BLOOD  Result Value Ref Range Status   Specimen Description BLOOD RIGHT ANTECUBITAL  Final   Special Requests   Final    AEROBIC BOTTLE ONLY Blood Culture results may not be optimal due to an inadequate volume of blood received in culture bottles   Culture   Final    NO GROWTH < 24 HOURS Performed at [facility], [place] [address], [place], [place] [address]    Report Status PENDING  Incomplete  Culture, blood (routine x 2)     Status: None (Preliminary result)   Collection Time: 05/13/19 10:30 AM   Specimen: BLOOD RIGHT ARM  Result Value Ref Range Status   Specimen Description BLOOD RIGHT ARM  Final   Special Requests    Final    AEROBIC BOTTLE ONLY Blood Culture results may not be optimal due to an inadequate volume of blood received in culture bottles   Culture   Final    NO GROWTH < 24 HOURS Performed at [facility], [place] [address], [place], [place] [address]    Report Status PENDING  Incomplete      Radiology Studies: DG Abd 1 View  Result Date: 05/13/2019 CLINICAL DATA:  Abdominal pain. EXAM: ABDOMEN - 1 VIEW COMPARISON:  March 23, 2019. FINDINGS: The bowel gas pattern is normal. No radio-opaque calculi or other significant radiographic abnormality are seen. IMPRESSION: Negative. Electronically Signed   By: Marijo Conception M.D.   On: 05/13/2019 16:26     Scheduled Meds: . apixaban  5 mg Oral BID  . atorvastatin  80 mg Oral q1800  . Chlorhexidine Gluconate Cloth  6 each Topical Daily  . feeding supplement (NEPRO CARB STEADY)  237 mL Oral BID BM  . influenza vaccine adjuvanted  0.5 mL Intramuscular Tomorrow-1000  . levothyroxine  100 mcg Oral Q0600  . lidocaine  1 application Urethral Once  . pneumococcal 23 valent vaccine  0.5 mL Intramuscular Tomorrow-1000  . sodium bicarbonate  650 mg Oral TID  . ticagrelor  90 mg Oral BID  . vitamin B-12  1,000 mcg Oral Daily   Continuous Infusions: . sodium chloride 100 mL/hr at 05/14/19 0526  . cefTRIAXone (ROCEPHIN)  IV 1 g (05/13/19 2028)     LOS: 6 days   Time Spent in minutes   45 minutes  Chanc Kervin D.O. on 05/14/2019 at 10:09 AM  Between 7am to 7pm - Please see pager noted on [email or web address]  After 7pm go to [email or web address]  And look for the night coverage person covering for me after hours  [facility] Office  [phone]

## 2019-05-15 ENCOUNTER — Encounter (HOSPITAL_COMMUNITY): Payer: Self-pay | Admitting: Internal Medicine

## 2019-05-15 LAB — RENAL FUNCTION PANEL
Albumin: 1.9 g/dL — ABNORMAL LOW (ref 3.5–5.0)
Anion gap: 11 (ref 5–15)
BUN: 32 mg/dL — ABNORMAL HIGH (ref 8–23)
CO2: 18 mmol/L — ABNORMAL LOW (ref 22–32)
Calcium: 7.7 mg/dL — ABNORMAL LOW (ref 8.9–10.3)
Chloride: 107 mmol/L (ref 98–111)
Creatinine, Ser: 2.7 mg/dL — ABNORMAL HIGH (ref 0.61–1.24)
GFR calc Af Amer: 26 mL/min — ABNORMAL LOW (ref >60)
GFR calc non Af Amer: 23 mL/min — ABNORMAL LOW (ref >60)
Glucose, Bld: 113 mg/dL — ABNORMAL HIGH (ref 70–99)
Phosphorus: 4.1 mg/dL (ref 2.5–4.6)
Potassium: 4.1 mmol/L (ref 3.5–5.1)
Sodium: 136 mmol/L (ref 135–145)

## 2019-05-15 LAB — HEMOGLOBIN AND HEMATOCRIT, BLOOD
HCT: 24.5 % — ABNORMAL LOW (ref 39.0–52.0)
Hemoglobin: 7.7 g/dL — ABNORMAL LOW (ref 13.0–17.0)

## 2019-05-15 LAB — CBC
HCT: 24.8 % — ABNORMAL LOW (ref 39.0–52.0)
Hemoglobin: 7.6 g/dL — ABNORMAL LOW (ref 13.0–17.0)
MCH: 28 pg (ref 26.0–34.0)
MCHC: 30.6 g/dL (ref 30.0–36.0)
MCV: 91.5 fL (ref 80.0–100.0)
Platelets: 382 10*3/uL (ref 150–400)
RBC: 2.71 MIL/uL — ABNORMAL LOW (ref 4.22–5.81)
RDW: 18.4 % — ABNORMAL HIGH (ref 11.5–15.5)
WBC: 14 10*3/uL — ABNORMAL HIGH (ref 4.0–10.5)
nRBC: 0 % (ref 0.0–0.2)

## 2019-05-15 LAB — HEPARIN LEVEL (UNFRACTIONATED)
Heparin Unfractionated: >2.2 IU/mL — ABNORMAL HIGH (ref 0.30–0.70)
Heparin Unfractionated: >2.2 IU/mL — ABNORMAL HIGH (ref 0.30–0.70)

## 2019-05-15 LAB — APTT
aPTT: >200 seconds (ref 24–36)
aPTT: >200 seconds (ref 24–36)

## 2019-05-15 MED ORDER — PRO-STAT SUGAR FREE PO LIQD
30.0000 mL | Freq: Three times a day (TID) | ORAL | Status: DC
  Administered 2019-05-16 – 2019-05-21 (×11): 30 mL via ORAL
  Filled 2019-05-15 (×11): qty 30

## 2019-05-15 MED ORDER — BOOST / RESOURCE BREEZE PO LIQD CUSTOM
1.0000 | Freq: Three times a day (TID) | ORAL | Status: DC
  Administered 2019-05-15 – 2019-05-22 (×10): 1 via ORAL

## 2019-05-15 MED ORDER — HEPARIN (PORCINE) 25000 UT/250ML-% IV SOLN
1250.0000 [IU]/h | INTRAVENOUS | Status: DC
  Administered 2019-05-15: 1250 [IU]/h via INTRAVENOUS
  Filled 2019-05-15: qty 250

## 2019-05-15 NOTE — Progress Notes (Addendum)
ANTICOAGULATION CONSULT NOTE - Follow Up Consult  Pharmacy Consult for Apixaban to Heparin Indication: LA thrombus  No Known Allergies  Patient Measurements: Height: _0  (195.6 cm) Weight: 215 lb 13.3 oz (97.9 kg) IBW/kg (Calculated) : 89.1  Heparin Dosing Weight:  97.9 kg  Vital Signs: Temp: 99.1 F (37.3 C) (03/18 2144) Temp Source: Oral (03/18 2144) BP: 136/61 (03/18 2144) Pulse Rate: 77 (03/18 2144)  Labs: Recent Labs    05/13/19 0328 05/13/19 0328 05/14/19 0247 05/15/19 0320 05/15/19 1427 05/15/19 1902  HGB 8.4*   < >  --  7.6* 7.7*  --   HCT 26.8*  --   --  24.8* 24.5*  --   PLT 375  --   --  382  --   --   APTT  --   --   --   --   --  >200*  HEPARINUNFRC  --   --   --   --   --  >2.20*  CREATININE 2.92*  --  2.79* 2.70*  --   --    < > = values in this interval not displayed.    Estimated Creatinine Clearance: 32.1 mL/min (A) (by C-G formula based on SCr of 2.7 mg/dL (H)).  Assessment: 69 year old male with history of MCA stenosis (S/P stent in 1/21) with LA thrombus on Eliquis prior to admission.  Now to begin heparin while Eliquis on hold in anticipation of bone marrow biopsy.  Last dose of Eliquis was on 3/17 PM.   Will use aPTT to guide heparin dosing until heparin level and aPTT correlate.  aPTT and heparin level drawn ~8.5 her after heparin infusion was started at 1250 units/hr were >200 sec and >2.20 units/ml, respectively. Per RN, no issues with IV. Per phlebotomist, labs were not drawn from the same arm in which the heparin IV was infusing. Phlebotomist reported that pt had bandage on arm from some prior bleeding during the day. H/H 7.7/24.5 (~stable), platelets 382.  Goal of Therapy:  aPTT 66-102 seconds Monitor platelets by anticoagulation protocol: Yes  Heparin level 0.3-0.7   Plan:  Hold heparin infusion for 1 hr and repeat aPTT, heparin level  Monitor daily aPTT, heparin level, CBC Monitor for signs/symptoms of bleeding  Gillermina Hu,  PharmD, BCPS, [facility], [place] Clinical Pharmacist 05/15/2019,10:15 PM

## 2019-05-15 NOTE — Plan of Care (Signed)
  Problem: Clinical Measurements: Goal: Complications related to the disease process or treatment will be avoided or minimized Outcome: Progressing

## 2019-05-15 NOTE — Consult Note (Addendum)
Chief Complaint: Patient was seen in consultation today for random renal biopsy Chief Complaint  Patient presents with  . abnormal labs   at the request of Dr Laurena Bering    Supervising Physician: Sandi Mariscal  Patient Status: [facility] - In-pt  History of Present Illness: Jeremy Sherman is a 69 y.o. male   CAD/CABG HTN; HLD; pre DM Recent CVA Carotid artery dz- on Brilinta and Eliquis  To ED with abnormal labs per PMD--- worsening renal failure Worsening confusion  AKI-- normal fxn even 04/21/19 (1.08) New meds per Cardiology Anemia (hg 7.6 today);  metabolic acidosis Renal following pt--- now requesting Random renal bx Admission Cr 4.72   Past Medical History:  Diagnosis Date  . AKI (acute kidney injury) ([place]) 04/2019  . CAD (coronary artery disease)   . Carotid stenosis, left    L ICA 50%  . Hyperlipidemia   . Hypertension   . Hypertensive urgency 03/11/2019  . 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 Prediabetes   . Stroke [facility])     Past Surgical History:  Procedure Laterality Date  . CORONARY ARTERY BYPASS GRAFT  07/02/2007   [place]-> LAD SVG-> PD branch of RCA [name]) at [place] Left 03/26/2019   Procedure: EXPOSURE OF LEFT COMMON CAROTID ARTERY AND PLACEMENT OF SHEATH;  Surgeon: Marty Heck, MD;  Location: [place];  Service: Vascular;  Laterality: Left;  . ENDARTERECTOMY Left 03/26/2019   Procedure: REMOVAL OF CAROTID SHEATH AND CLOSURE OF  CAROTID ARTERY;  Surgeon: Serafina Mitchell, MD;  Location: [facility] OR;  Service: Vascular;  Laterality: Left;  . IR ANGIO INTRA EXTRACRAN SEL COM CAROTID INNOMINATE BILAT MOD SED  03/21/2019  . IR ANGIO INTRA EXTRACRAN SEL INTERNAL CAROTID UNI L MOD SED  03/26/2019  . IR ANGIO VERTEBRAL SEL SUBCLAVIAN INNOMINATE BILAT MOD SED  03/21/2019  . IR ANGIOGRAM FOLLOW UP STUDY  03/26/2019  . IR ANGIOGRAM FOLLOW UP STUDY  03/26/2019  . IR ANGIOGRAM FOLLOW UP STUDY  03/26/2019  .  IR ANGIOGRAM FOLLOW UP STUDY  03/26/2019  . IR ANGIOGRAM FOLLOW UP STUDY  03/26/2019  . IR ANGIOGRAM FOLLOW UP STUDY  03/26/2019  . IR CT HEAD LTD  03/26/2019  . IR INTRA CRAN STENT  03/26/2019  . IR INTRAVSC STENT CERV CAROTID W/O EMB-PROT MOD SED INC ANGIO  03/26/2019  . IR NEURO EACH ADD'L AFTER BASIC UNI LEFT ([facility])  03/26/2019  . IR TRANSCATH/EMBOLIZ  03/26/2019    Allergies: Patient has no known allergies.  Medications: Prior to Admission medications   Medication Sig Start Date End Date Taking? Authorizing Provider  ammonium lactate (LAC-HYDRIN) 12 % lotion Apply topically 2 (two) times daily. Apply to affected area 04/23/19  Yes Angiulli, Lavon Paganini, PA-C  apixaban (ELIQUIS) 5 MG TABS tablet Take 1 tablet (5 mg total) by mouth 2 (two) times daily. 04/23/19  Yes Angiulli, Lavon Paganini, PA-C  atorvastatin (LIPITOR) 80 MG tablet Take 1 tablet (80 mg total) by mouth daily at 6 PM. 04/23/19  Yes Angiulli, Lavon Paganini, PA-C  diclofenac Sodium (VOLTAREN) 1 % GEL Apply 2 g topically 4 (four) times daily. 04/23/19  Yes Angiulli, Lavon Paganini, PA-C  levothyroxine (SYNTHROID) 100 MCG tablet Take 1 tablet (100 mcg total) by mouth daily at 6 (six) AM. 04/23/19  Yes Angiulli, Lavon Paganini, PA-C  pantoprazole (PROTONIX) 40 MG tablet Take 1 tablet (40 mg total) by mouth at bedtime. 04/23/19  Yes  Angiulli, Lavon Paganini, PA-C  polyethylene glycol (MIRALAX / GLYCOLAX) 17 g packet Take 17 g by mouth daily. Patient taking differently: Take 17 g by mouth daily as needed for mild constipation.  04/24/19  Yes Angiulli, Lavon Paganini, PA-C  senna (SENOKOT) 8.6 MG TABS tablet Take 1 tablet (8.6 mg total) by mouth at bedtime. Patient taking differently: Take 1 tablet by mouth at bedtime as needed for mild constipation.  04/23/19  Yes Angiulli, Lavon Paganini, PA-C  ticagrelor (BRILINTA) 90 MG TABS tablet Take 1 tablet (90 mg total) by mouth 2 (two) times daily. 04/23/19  Yes Angiulli, Lavon Paganini, PA-C  traMADol (ULTRAM) 50 MG tablet Take 1 tablet (50 mg total)  by mouth every 6 (six) hours as needed for moderate pain or severe pain. 04/23/19  Yes Angiulli, Lavon Paganini, PA-C     Family History  Problem Relation Age of Onset  . Stroke Mother     Social History   Socioeconomic History  . Marital status: Married    Spouse name: Not on file  . Number of children: Not on file  . Years of education: Not on file  . Highest education level: Not on file  Occupational History  . Not on file  Tobacco Use  . Smoking status: Former Smoker    Packs/day: 0.50    Years: 45.00    Pack years: 22.50    Types: Cigarettes    Start date: 02/27/1974    Quit date: 03/11/2019    Years since quitting: 0.1  . Smokeless tobacco: Never Used  Substance and Sexual Activity  . Alcohol use: Not Currently  . Drug use: Never  . Sexual activity: Not on file  Other Topics Concern  . Not on file  Social History Narrative  . Not on file   Social Determinants of Health   Financial Resource Strain:   . Difficulty of Paying Living Expenses:   Food Insecurity:   . Worried About Charity fundraiser in the Last Year:   . Arboriculturist in the Last Year:   Transportation Needs:   . Film/video editor (Medical):   Marland Kitchen Lack of Transportation (Non-Medical):   Physical Activity:   . Days of Exercise per Week:   . Minutes of Exercise per Session:   Stress:   . Feeling of Stress :   Social Connections:   . Frequency of Communication with Friends and Family:   . Frequency of Social Gatherings with Friends and Family:   . Attends Religious Services:   . Active Member of Clubs or Organizations:   . Attends Archivist Meetings:   Marland Kitchen Marital Status:     Review of Systems: A 12 point ROS discussed and pertinent positives are indicated in the HPI above.  All other systems are negative.  Review of Systems  Constitutional: Positive for activity change. Negative for fatigue and fever.  Respiratory: Negative for cough and shortness of breath.   Cardiovascular:  Negative for chest pain.  Gastrointestinal: Positive for nausea.  Neurological: Positive for weakness.  Psychiatric/Behavioral: Positive for decreased concentration. Negative for behavioral problems.    Vital Signs: BP 129/73 (BP Location: Right Arm)   Pulse 82   Temp 98 F (36.7 C) (Oral)   Resp 20   Ht 6\' 5"  (1.956 m)   Wt 215 lb 13.3 oz (97.9 kg)   SpO2 96%   BMI 25.59 kg/m   Physical Exam Vitals reviewed.  Cardiovascular:     Rate  and Rhythm: Normal rate and regular rhythm.     Heart sounds: Normal heart sounds.  Pulmonary:     Breath sounds: Normal breath sounds.  Abdominal:     Palpations: Abdomen is soft.  Skin:    General: Skin is warm and dry.  Neurological:     Mental Status: He is alert.  Psychiatric:        Behavior: Behavior normal.        Thought Content: Thought content normal.        Judgment: Judgment normal.     Comments: Consented with wife Mee Hives     Imaging: DG Abd 1 View  Result Date: 05/13/2019 CLINICAL DATA:  Abdominal pain. EXAM: ABDOMEN - 1 VIEW COMPARISON:  March 23, 2019. FINDINGS: The bowel gas pattern is normal. No radio-opaque calculi or other significant radiographic abnormality are seen. IMPRESSION: Negative. Electronically Signed   By: Marijo Conception M.D.   On: 05/13/2019 16:26   CT HEAD WO CONTRAST  Result Date: 05/08/2019 CLINICAL DATA:  69 year old male with encephalopathy. EXAM: CT HEAD WITHOUT CONTRAST TECHNIQUE: Contiguous axial images were obtained from the base of the skull through the vertex without intravenous contrast. COMPARISON:  Brain MRI 03/18/2019. CTA head and neck 03/26/2019. FINDINGS: Brain: Mild streak artifact associated with a distal ACA region aneurysm coil pack. No midline shift, ventriculomegaly, mass effect, evidence of mass lesion, intracranial hemorrhage or evidence of cortically based acute infarction. Gray-white matter differentiation is within normal limits throughout the brain. Mild for age white matter  hypodensity, most pronounced in the right cerebellum. Vascular: No suspicious intracranial vascular hyperdensity. Skull: No acute osseous abnormality identified. Sinuses/Orbits: The visible paranasal sinuses have cleared since January. Tympanic cavities and mastoids remain well pneumatized. Other: No acute orbit or scalp soft tissue finding. IMPRESSION: 1. No acute intracranial abnormality. 2. Left MCA M1 and distal ACA stents with pericallosal region coil pack redemonstrated. Electronically Signed   By: Genevie Ann M.D.   On: 05/08/2019 02:30   US RENAL  Result Date: 05/08/2019 CLINICAL DATA:  Acute kidney injury EXAM: RENAL / URINARY TRACT ULTRASOUND COMPLETE COMPARISON:  March 24, 2019 FINDINGS: Right Kidney: Renal measurements: 12.5 x 5.7 x 4.2 cm = volume: 156 mL . Echogenicity within normal limits. No mass or hydronephrosis visualized. Left Kidney: Renal measurements: 12.3 x 6.1 x 4.6 cm = volume: 180 mL. Echogenicity within normal limits. No mass or hydronephrosis visualized. Bladder: The bladder is decompressed with a Foley catheter. Other: None. IMPRESSION: No acute abnormality.  No hydronephrosis. Electronically Signed   By: Constance Holster M.D.   On: 05/08/2019 02:49   DG Chest Port 1 View  Result Date: 05/08/2019 CLINICAL DATA:  69 year old male with pain. EXAM: PORTABLE CHEST 1 VIEW COMPARISON:  Chest radiographs 04/02/2019 and earlier. FINDINGS: Portable AP semi upright view at 2349 hours. Right PICC line has been removed since last month. Stable cardiomegaly and mediastinal contours. Lung volumes are at the upper limits of normal. Allowing for portable technique the lungs are clear. Visualized tracheal air column is within normal limits. No pneumothorax. Prior sternotomy. IMPRESSION: Stable cardiomegaly. No acute cardiopulmonary abnormality. Electronically Signed   By: Genevie Ann M.D.   On: 05/08/2019 00:15    Labs:  CBC: Recent Labs    05/11/19 0405 05/12/19 0302 05/13/19 0328  05/15/19 0320  WBC 9.2 12.0* 14.3* 14.0*  HGB 8.1* 8.2* 8.4* 7.6*  HCT 25.5* 25.9* 26.8* 24.8*  PLT 349 354 375 382    COAGS: Recent Labs  03/21/19 0413 03/29/19 0528  INR 1.0 1.4*    BMP: Recent Labs    05/12/19 0302 05/13/19 0328 05/14/19 0247 05/15/19 0320  NA 141 137 137 136  K 3.8 4.1 3.9 4.1  CL 109 107 107 107  CO2 21* 18* 20* 18*  GLUCOSE 102* 93 95 113*  BUN 40* 36* 33* 32*  CALCIUM 7.9* 8.1* 8.1* 7.7*  CREATININE 3.39* 2.92* 2.79* 2.70*  GFRNONAA 17* 21* 22* 23*  GFRAA 20* 24* 25* 26*    LIVER FUNCTION TESTS: Recent Labs    03/28/19 0633 03/28/19 0633 03/28/19 1553 03/28/19 1553 04/04/19 0750 04/04/19 0750 04/21/19 0550 05/08/19 1948 05/12/19 0302 05/13/19 0328 05/14/19 0247 05/15/19 0320  BILITOT 0.7  --  0.6  --  1.1  --  1.1  --   --   --   --   --   AST 26  --  34  --  48*  --  15  --   --   --   --   --   ALT 17  --  19  --  52*  --  14  --   --   --   --   --   ALKPHOS 71  --  89  --  168*  --  85  --   --   --   --   --   PROT 5.6*  --  6.1*  --  6.3*  --  6.7  --   --   --   --   --   ALBUMIN 2.3*   < > 2.3*   < > 2.3*   < > 2.5*   < > 2.1* 2.1* 1.9* 1.9*   < > = values in this interval not displayed.    TUMOR MARKERS: No results for input(s): AFPTM, CEA, CA199, CHROMGRNA in the last 8760 hours.  Assessment and Plan:  Acute kidney disease Worsening renal fxn New Cardiac meds--- LD Eiquis and LD Brilinta 3/17 Need off 5 days Renal biopsy scheduled for 3/22 in IR Risks and benefits of random renal bx was discussed with the patient's wife via phone  including, but not limited to bleeding, infection, damage to adjacent structures or low yield requiring additional tests.  All of the questions were answered and there is agreement to proceed. Consent signed and in chart.   Thank you for this interesting consult.  I greatly enjoyed meeting [employer] and look forward to participating in their care.  A copy of this  report was sent to the requesting provider on this date.  Electronically Signed: Lavonia Drafts, PA-C 05/15/2019, 10:39 AM   I spent a total of 20 Minutes    in face to face in clinical consultation, greater than 50% of which was counseling/coordinating care for random renal bx

## 2019-05-15 NOTE — Progress Notes (Signed)
CRITICAL VALUE ALERT  Critical Value:  PTT > 200  Date & Time Notied:  05/15/2019 @ [id number]  Provider Notified: Beaulah Corin  Orders Received/Actions taken: no new orders

## 2019-05-15 NOTE — Progress Notes (Addendum)
ANTICOAGULATION CONSULT NOTE - Follow Up Consult  Pharmacy Consult for Apixaban to Heparin Indication: LA thrombus  No Known Allergies  Patient Measurements: Height: 6\' 5"  (195.6 cm) Weight: 215 lb 13.3 oz (97.9 kg) IBW/kg (Calculated) : 89.1  Vital Signs: Temp: 98 F (36.7 C) (03/18 0911) Temp Source: Oral (03/18 0911) BP: 129/73 (03/18 0911) Pulse Rate: 82 (03/18 0911)  Labs: Recent Labs    05/13/19 0328 05/14/19 0247 05/15/19 0320  HGB 8.4*  --  7.6*  HCT 26.8*  --  24.8*  PLT 375  --  382  CREATININE 2.92* 2.79* 2.70*    Estimated Creatinine Clearance: 32.1 mL/min (A) (by C-G formula based on SCr of 2.7 mg/dL (H)).  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  Now to begin heparin while Eliquis on hold in anticipation of  biopsy.  Last dose of Eliquis 3/17 PM.   Will use PTTs to guide heparin dosing until heparin level and PTTs corelate  Goal of Therapy:  aPTT 66-102 seconds Monitor platelets by anticoagulation protocol: Yes  Heparin level 0.3-0.7   Plan:  Heparin at 1250 units / hr 8 hour heparin level / PTT Daily labs  Thank you Anette Guarneri, PharmD [phone]  05/15/2019,9:48 AM

## 2019-05-15 NOTE — Progress Notes (Signed)
Nutrition Follow-up  DOCUMENTATION CODES:   Not applicable  INTERVENTION:   -D/c Nepro shake, due to poor acceptance -Boost Breeze po TID, each supplement provides 250 kcal and 9 grams of protein -30 ml Prostat TID, each supplement provides 100 kcals and 15 grams protein -Continue Magic cup BID with meals, each supplement provides 290 kcal and 9 grams of protein -Continue liberalized diet of 2 gram sodium  NUTRITION DIAGNOSIS:   Inadequate oral intake related to acute illness, poor appetite as evidenced by meal completion < 25%.  Ongoing  GOAL:   Patient will meet greater than or equal to 90% of their needs  Unmet   MONITOR:   PO intake, Supplement acceptance, Labs, Weight trends  REASON FOR ASSESSMENT:   Consult Assessment of nutrition requirement/status  ASSESSMENT:   69 yo male admitted with AKI with metabolic acidosis, acute metabolic encephalopathy. PMH includes CAD s/p CABG,HTN, pre-DM, CVA, HLD,current tobacco user/smoker  Reviewed I/O's: +1.2 ml x 24 hours and +1.4 L since admission  UOP: 850 ml x 24 hours  Spoke with pt at bedside, who was pleasant and in good spirits today. Pt answered most questions appropriately, but often does not recall specifics details during interview, so difficult to obtain accurate history. He reports feeling better, but complains of poor oral intake. Per pt, this has been going for at least 1-2 weeks PTA. Observed breakfast tray- pt consumed only about 20% of french toast. Per pt, "I try to eat, but when I do, I throw up".   Noticed pt with multiple missing teeth; he denies any difficulty chewing or swallowing regular textured foods and had been doing this at home. Pt denies any difficulty swallowing liquids; he, however, does not like Nepro. Pt shares he has been consuming mostly water and ice chips, which do not cause him distress.   Discussed with pt importance of good meal and supplement intake to promote healing.   Medications  reviewed and include vitamin B-12 and heparin.   Per IR notes, plan for renal biopsy on 05/19/19.  Labs reviewed.   NUTRITION - FOCUSED PHYSICAL EXAM:    Most Recent Value  Orbital Region  No depletion  Upper Arm Region  No depletion  Thoracic and Lumbar Region  No depletion  Buccal Region  No depletion  Temple Region  No depletion  Clavicle Bone Region  No depletion  Clavicle and Acromion Bone Region  No depletion  Scapular Bone Region  No depletion  Dorsal Hand  No depletion  Patellar Region  Mild depletion  Anterior Thigh Region  Mild depletion  Posterior Calf Region  Mild depletion  Edema (RD Assessment)  Mild  Hair  Reviewed  Eyes  Reviewed  Mouth  Reviewed  Skin  Reviewed  Nails  Reviewed       Diet Order:   Diet Order            Diet NPO time specified Except for: Sips with Meds  Diet effective midnight        Diet 2 gram sodium Room service appropriate? Yes; Fluid consistency: Thin  Diet effective now              EDUCATION NEEDS:   Not appropriate for education at this time  Skin:  Skin Assessment: Skin Integrity Issues: Skin Integrity Issues:: Other (Comment) Other: non-pressure wound on buttock  Last BM:  05/14/19  Height:   Ht Readings from Last 1 Encounters:  05/10/19 6\' 5"  (1.956 m)    Weight:  Wt Readings from Last 1 Encounters:  05/13/19 97.9 kg   BMI:  Body mass index is 25.59 kg/m.  Estimated Nutritional Needs:   Kcal:  2400-2700 kcals  Protein:  120-135 g  Fluid:  >/= 2 L    Loistine Chance, RD, LDN, CDCES Registered Dietitian II Certified Diabetes Care and Education Specialist Please refer to [facility] for RD and/or RD on-call/weekend/after hours pager

## 2019-05-15 NOTE — Progress Notes (Signed)
[facility] Progress Note    Assessment/ Plan:   Pt is a 69 y.o. yo male with HTN, CAD who was admitted on 05/07/2019 with AKI (crt 1.08 on 04/21/19)  Assessment/Plan: 1. Renal-  True AKI from normal crt on 04/21/19.  Left hospital with indwelling foley-  Has stayed in place and changed out per home health on Monday.  Many new meds per cardiology including brilinta and a statin.  U/A showing 100 of prot, white and red blood cells- possible UTI but no growth on culture.  Renal u/s normal with 12 cm kidneys.  Is non oliguric. BP has not been low.  CK 78.  ANA negative, complements WNLand ANCA negative. Protein to crt ratio low. Right now non oliguric and crt trending better.  Will do SPEP/ free light chains--> M-spike 1.0. has not had + urine culture in 2021.  Since Cr hasn't improved as expected and SPEP resulted with M-spike- think we need renal biopsy.  Have ordered, appreciate IR assistance 2. + M-spike- would consider heme c/s too 3.  Fever/ vomiting: UA/ culture redrawn, blood cultures done, restarted ceftriaxone. AXR--> negative  4. HTN/volume-  Appeared dry if anything-  Had not been eating well-  Was getting IVF -  Now BP is up have stopped 5 Anemia-  Significant-  Low iron stores-  First feraheme dose 3/12, s/p 3/15. Recheck pending  6 Metabolic acidosis - on oral bicarb - improving some 7.  Dispo: Since Cr has stalled out and M-spike resulted 1.0, would like to keep to complete workup  Subjective:    M-spike 1.0, urine cultures negative- of note have been negative since January.  Cr improvement stalled out at 2.7.    Objective:   BP 129/73 (BP Location: Right Arm)   Pulse 82   Temp 98 F (36.7 C) (Oral)   Resp 20   Ht 6\' 5"  (1.956 m)   Wt 97.9 kg   SpO2 96%   BMI 25.59 kg/m   Intake/Output Summary (Last 24 hours) at 05/15/2019 1128 Last data filed at 05/15/2019 0800 Gross per 24 hour  Intake 1561.53 ml  Output 1075 ml  Net 486.53 ml   Weight change:    Physical Exam: Gen: sitting in bed, getting cleaned up by nursing CVS: RRR Resp: clear Abd: soft, nontender Ext:no LE edema  Imaging: DG Abd 1 View  Result Date: 05/13/2019 CLINICAL DATA:  Abdominal pain. EXAM: ABDOMEN - 1 VIEW COMPARISON:  March 23, 2019. FINDINGS: The bowel gas pattern is normal. No radio-opaque calculi or other significant radiographic abnormality are seen. IMPRESSION: Negative. Electronically Signed   By: Marijo Conception M.D.   On: 05/13/2019 16:26    Labs: BMET Recent Labs  Lab 05/09/19 0729 05/10/19 0512 05/11/19 0405 05/12/19 0302 05/13/19 0328 05/14/19 0247 05/15/19 0320  NA 136 138 137 141 137 137 136  K 4.3 4.0 3.7 3.8 4.1 3.9 4.1  CL 108 108 109 109 107 107 107  CO2 18* 20* 18* 21* 18* 20* 18*  GLUCOSE 88 89 95 102* 93 95 113*  BUN 47* 46* 41* 40* 36* 33* 32*  CREATININE 4.35* 4.27* 3.71* 3.39* 2.92* 2.79* 2.70*  CALCIUM 7.7* 7.9* 7.9* 7.9* 8.1* 8.1* 7.7*  PHOS 4.9* 5.0* 4.4 4.3 3.8 4.0 4.1   CBC Recent Labs  Lab 05/11/19 0405 05/12/19 0302 05/13/19 0328 05/15/19 0320  WBC 9.2 12.0* 14.3* 14.0*  HGB 8.1* 8.2* 8.4* 7.6*  HCT 25.5* 25.9* 26.8* 24.8*  MCV 88.2  89.9 93.4 91.5  PLT 349 354 375 382    Medications:    . atorvastatin  80 mg Oral q1800  . Chlorhexidine Gluconate Cloth  6 each Topical Daily  . feeding supplement (NEPRO CARB STEADY)  237 mL Oral BID BM  . influenza vaccine adjuvanted  0.5 mL Intramuscular Tomorrow-1000  . levothyroxine  100 mcg Oral Q0600  . lidocaine  1 application Urethral Once  . pneumococcal 23 valent vaccine  0.5 mL Intramuscular Tomorrow-1000  . sodium bicarbonate  650 mg Oral TID  . vitamin B-12  1,000 mcg Oral Daily      Madelon Lips, MD

## 2019-05-15 NOTE — Progress Notes (Signed)
PROGRESS NOTE    Jeremy Sherman  GMW:[id number] DOB: January 25, 1951 DOA: 05/07/2019 PCP: Jeremy Riches, NP   Brief Narrative:  HPI On 05/08/2019 by Dr. Shela Leff Jeremy Sherman is a 69 y.o. male with medical history significant of CAD status post CABG, hypertension, hyperlipidemia, hypothyroidism, prediabetes, recent hospital admission for stroke, carotid artery disease status post stent on Eliquis and Brilinta, left atrial appendage thrombus presenting to the ED for evaluation of abnormal labs.  Patient was called by his physician after outpatient labs revealed worsening renal failure.  Patient appears confused and is not sure why he is here.  He has no complaints.  Denies fevers, chills, chest pain, cough, shortness of breath, nausea, vomiting, abdominal pain, diarrhea, flank pain, or dysuria.  No family available at this time.  Per ED provider's conversation with the patient's wife since his discharge from rehab 2 weeks ago he has had slowly worsening confusion.  He has an indwelling Foley catheter since his prior hospitalization which has been draining urine.  Interim history Patient found to have acute kidney injury and nephrology consulted and following.  Also developed fever with worsening leukocytosis, work-up thus far has been unremarkable.  There was initial concern of chordee when he was first admitted however repeat urine cultures show no growth. Assessment & Plan   Acute kidney injury -Creatinine on admission 4.72 (baseline approximately 1) -Today creatinine down to 2.70 -Nephrology consulted and appreciated- discussed with Dr. Hollie Salk, planning for bone marrow biopsy -Renal ultrasound unremarkable -ANCA negative -Patient was placed on IV fluids -Continue to monitor BMP  Complicated UTI/pyelonephritis/pyuria with hematuria in a patient with chronic indwelling Foley catheter due to urinary retention and BPH -Urine culture initially unremarkable.  Repeat  urine culture on 05/13/2019 show no growth  -Foley catheter was changed on 05/13/2019 -Blood cultures show no growth to date -patient was placed on IV ceftriaxone   Acute metabolic encephalopathy -Suspect some underlying cognitive impairment -Multifactorial including dehydration, AKI, hyperammonemia, low B12, tramadol use -Appears to be improving, although unsure patient's baseline  Normocytic anemia -Certainly iron deficiency anemia versus anemia of chronic disease -Hemoglobin currently 7.6 -Iron saturation 9, B12 142 -Was given B12 supplementation along with IV iron and ESA per nephrology -FOBT pending -Of note, patient on Brilinta and Eliquis- will hold for bone marrow biopsy and place on heparin  History of CVA -Left CAS/left MCA stenosis status post left CEA and left MCA stent in January 2021.  On Brilinta.  Patient also noted to have a left atrial appendage thrombus was placed on Eliquis. -Currently no focal neuro deficits other than lower extremity bilateral weakness.  Patient was discharged home from rehab on 04/24/2019. -Continue Brilinta, Eliquis, statin- as above- hold Brilinta and Eliquis  -PT, OT recommending SNF  History of coronary artery disease -Patient with CABG in 2019 -Currently stable, no complaints of chest pain  Essential hypertension -Currently on no meds, will add on meds as needed  Hypothyroidism -Continue Synthroid -TSH 6.5- however this was a hemolyzed sample  Hyponatremia -Resolved  Hyperphosphatemia -Likely due to renal failure -Treatment per nephrology  Generalized weakness with debility -PT and OT as above  Chronic pain -Continue scheduled Tylenol, avoid sedating medications   GERD -Continue PPI  DVT Prophylaxis Eliquis--> Heparin  Code Status: Full  Family Communication: None at bedside  Disposition Plan: Admitted from home for acute metabolic encephalopathy along with acute kidney injury.  Pending bone marrow biopsy. Patient did  develop fever, pending work-up.  Suspect disposition to  SNF when stable.   Consultants Nephrology  Procedures  Renal ultrasound  Antibiotics   Anti-infectives (From admission, onward)   Start     Dose/Rate Route Frequency Ordered Stop   05/13/19 1045  cefTRIAXone (ROCEPHIN) 1 g in sodium chloride 0.9 % 100 mL IVPB     1 g 200 mL/hr over 30 Minutes Intravenous Every 24 hours 05/13/19 1036     05/08/19 0130  cefTRIAXone (ROCEPHIN) 1 g in sodium chloride 0.9 % 100 mL IVPB  Status:  Discontinued     1 g 200 mL/hr over 30 Minutes Intravenous Daily at bedtime 05/08/19 0100 05/09/19 4944      Subjective:   Jeremy Sherman seen and examined today.  Has no complaints this morning. States he does not want me to take blood from him. After orienting him, and explaining who I was, he stated he was feeling mildly better than previous days but not ready to leave the hospital. Denies chest pain, shortness of breath, abdominal pain, N/V, dizziness, headache.   Objective:   Vitals:   05/14/19 2053 05/14/19 2223 05/15/19 0501 05/15/19 0911  BP: (!) 103/51 127/70 119/68 129/73  Pulse: 81 (!) 109 77 82  Resp: _0 Temp: 99.3 F (37.4 C) 98.4 F (36.9 C) 98.5 F (36.9 C) 98 F (36.7 C)  TempSrc: Oral Oral Oral Oral  SpO2: 100% 100% 95% 96%  Weight:      Height:        Intake/Output Summary (Last 24 hours) at 05/15/2019 0932 Last data filed at 05/15/2019 0800 Gross per 24 hour  Intake 1959.9 ml  Output 1075 ml  Net 884.9 ml   Filed Weights   05/10/19 2113 05/11/19 2031 05/13/19 2059  Weight: 98 kg 97.6 kg 97.9 kg   Exam  General: Well developed,chronically ill appearing, NAD  HEENT: NCAT, mucous membranes moist.   Cardiovascular: S1 S2 auscultated, RRR  Respiratory: Clear to auscultation bilaterally with equal chest rise  Abdomen: Soft, nontender, nondistended, + bowel sounds  Extremities: warm dry without cyanosis clubbing or edema  Neuro: AAOx2 (self, place),  nonfocal  Psych: Appropriate mood and affect, pleasantly confused   Data Reviewed: I have personally reviewed following labs and imaging studies  CBC: Recent Labs  Lab 05/10/19 0512 05/11/19 0405 05/12/19 0302 05/13/19 0328 05/15/19 0320  WBC 5.7 9.2 12.0* 14.3* 14.0*  HGB 7.8* 8.1* 8.2* 8.4* 7.6*  HCT 24.9* 25.5* 25.9* 26.8* 24.8*  MCV 90.2 88.2 89.9 93.4 91.5  PLT 327 349 354 375 967   Basic Metabolic Panel: Recent Labs  Lab 05/09/19 0729 05/09/19 0729 05/10/19 0512 05/10/19 0512 05/11/19 0405 05/12/19 0302 05/13/19 0328 05/14/19 0247 05/15/19 0320  NA 136   < > 138   < > 137 141 137 137 136  K 4.3   < > 4.0   < > 3.7 3.8 4.1 3.9 4.1  CL 108   < > 108   < > 109 109 107 107 107  CO2 18*   < > 20*   < > 18* 21* 18* 20* 18*  GLUCOSE 88   < > 89   < > 95 102* 93 95 113*  BUN 47*   < > 46*   < > 41* 40* 36* 33* 32*  CREATININE 4.35*   < > 4.27*   < > 3.71* 3.39* 2.92* 2.79* 2.70*  CALCIUM 7.7*   < > 7.9*   < > 7.9* 7.9* 8.1* 8.1* 7.7*  MG  2.7*  --  2.8*  --  2.5* 2.4 2.4  --   --   PHOS 4.9*   < > 5.0*   < > 4.4 4.3 3.8 4.0 4.1   < > = values in this interval not displayed.   GFR: Estimated Creatinine Clearance: 32.1 mL/min (A) (by C-G formula based on SCr of 2.7 mg/dL (H)). Liver Function Tests: Recent Labs  Lab 05/11/19 0405 05/12/19 0302 05/13/19 0328 05/14/19 0247 05/15/19 0320  ALBUMIN 2.1* 2.1* 2.1* 1.9* 1.9*   No results for input(s): LIPASE, AMYLASE in the last 168 hours. No results for input(s): AMMONIA in the last 168 hours. Coagulation Profile: No results for input(s): INR, PROTIME in the last 168 hours. Cardiac Enzymes: Recent Labs  Lab 05/08/19 1948  CKTOTAL 78   BNP (last 3 results) No results for input(s): PROBNP in the last 8760 hours. HbA1C: No results for input(s): HGBA1C in the last 72 hours. CBG: No results for input(s): GLUCAP in the last 168 hours. Lipid Profile: No results for input(s): CHOL, HDL, LDLCALC, TRIG, CHOLHDL,  LDLDIRECT in the last 72 hours. Thyroid Function Tests: No results for input(s): TSH, T4TOTAL, FREET4, T3FREE, THYROIDAB in the last 72 hours. Anemia Panel: No results for input(s): VITAMINB12, FOLATE, FERRITIN, TIBC, IRON, RETICCTPCT in the last 72 hours. Urine analysis:    Component Value Date/Time   COLORURINE YELLOW 05/13/2019 0954   APPEARANCEUR HAZY (A) 05/13/2019 0954   LABSPEC 1.016 05/13/2019 0954   PHURINE 6.0 05/13/2019 0954   GLUCOSEU NEGATIVE 05/13/2019 0954   HGBUR LARGE (A) 05/13/2019 0954   BILIRUBINUR NEGATIVE 05/13/2019 0954   KETONESUR NEGATIVE 05/13/2019 0954   PROTEINUR 100 (A) 05/13/2019 0954   NITRITE NEGATIVE 05/13/2019 0954   LEUKOCYTESUR LARGE (A) 05/13/2019 0954   Sepsis Labs: _0 (procalcitonin:4,lacticidven:4)  ) Recent Results (from the past 240 hour(s))  SARS CORONAVIRUS 2 (TAT 6-24 HRS) Nasopharyngeal Nasopharyngeal Swab     Status: None   Collection Time: 05/07/19  8:48 PM   Specimen: Nasopharyngeal Swab  Result Value Ref Range Status   SARS Coronavirus 2 NEGATIVE NEGATIVE Final    Comment: (NOTE) SARS-CoV-2 target nucleic acids are NOT DETECTED. The SARS-CoV-2 RNA is generally detectable in upper and lower respiratory specimens during the acute phase of infection. Negative results do not preclude SARS-CoV-2 infection, do not rule out co-infections with other pathogens, and should not be used as the sole basis for treatment or other patient management decisions. Negative results must be combined with clinical observations, patient history, and epidemiological information. The expected result is Negative. Fact Sheet for Patients: SugarRoll.be Fact Sheet for Healthcare Providers: https://www.woods-mathews.com/ This test is not yet approved or cleared by the [place] FDA and  has been authorized for detection and/or diagnosis of SARS-CoV-2 by FDA under an Emergency Use Authorization (EUA).  This EUA will remain  in effect (meaning this test can be used) for the duration of the COVID-19 declaration under Section 56 4(b)(1) of the Act, 21 U.S.C. section 360bbb-3(b)(1), unless the authorization is terminated or revoked sooner. Performed at [facility], [place] [address] [place], [place] [address]   Urine culture     Status: None   Collection Time: 05/07/19 11:45 PM   Specimen: Urine, Random  Result Value Ref Range Status   Specimen Description URINE, RANDOM  Final   Special Requests NONE  Final   Culture   Final    NO GROWTH Performed at [facility], [place],  [place] [address]    Report Status 05/08/2019 FINAL  Final  Culture, blood (routine x 2)     Status: None   Collection Time: 05/08/19  1:20 AM   Specimen: BLOOD  Result Value Ref Range Status   Specimen Description BLOOD RIGHT ARM  Final   Special Requests   Final    BOTTLES DRAWN AEROBIC AND ANAEROBIC Blood Culture adequate volume   Culture   Final    NO GROWTH 5 DAYS Performed at [facility], [address], [place], [place] [address]    Report Status 05/13/2019 FINAL  Final  Culture, blood (routine x 2)     Status: None   Collection Time: 05/08/19  1:20 AM   Specimen: BLOOD  Result Value Ref Range Status   Specimen Description BLOOD LEFT ARM  Final   Special Requests   Final    BOTTLES DRAWN AEROBIC AND ANAEROBIC Blood Culture adequate volume   Culture   Final    NO GROWTH 5 DAYS Performed at [facility], [place] [address], [place], [place] [address]    Report Status 05/13/2019 FINAL  Final  Culture, blood (routine x 2)     Status: None (Preliminary result)   Collection Time: 05/13/19 10:04 AM   Specimen: BLOOD  Result Value Ref Range Status   Specimen Description BLOOD RIGHT ANTECUBITAL  Final   Special Requests   Final    AEROBIC BOTTLE ONLY Blood Culture results may not be optimal due to an inadequate volume of blood received in culture bottles   Culture    Final    NO GROWTH 2 DAYS Performed at [facility], [place] [address] [place], [place] [address]    Report Status PENDING  Incomplete  Culture, blood (routine x 2)     Status: None (Preliminary result)   Collection Time: 05/13/19 10:30 AM   Specimen: BLOOD RIGHT ARM  Result Value Ref Range Status   Specimen Description BLOOD RIGHT ARM  Final   Special Requests   Final    AEROBIC BOTTLE ONLY Blood Culture results may not be optimal due to an inadequate volume of blood received in culture bottles   Culture   Final    NO GROWTH 2 DAYS Performed at [facility], [place] [address], [place], [place] [address]    Report Status PENDING  Incomplete  Culture, Urine     Status: None   Collection Time: 05/13/19  6:58 PM   Specimen: Urine, Catheterized  Result Value Ref Range Status   Specimen Description URINE, CATHETERIZED  Final   Special Requests NONE  Final   Culture   Final    NO GROWTH Performed at [facility], [address], [place], [place] [address]    Report Status 05/14/2019 FINAL  Final      Radiology Studies: DG Abd 1 View  Result Date: 05/13/2019 CLINICAL DATA:  Abdominal pain. EXAM: ABDOMEN - 1 VIEW COMPARISON:  March 23, 2019. FINDINGS: The bowel gas pattern is normal. No radio-opaque calculi or other significant radiographic abnormality are seen. IMPRESSION: Negative. Electronically Signed   By: Marijo Conception M.D.   On: 05/13/2019 16:26     Scheduled Meds: . atorvastatin  80 mg Oral q1800  . Chlorhexidine Gluconate Cloth  6 each Topical Daily  . feeding supplement (NEPRO CARB STEADY)  237 mL Oral BID BM  . influenza vaccine adjuvanted  0.5 mL Intramuscular Tomorrow-1000  . levothyroxine  100 mcg Oral Q0600  . lidocaine  1 application Urethral Once  . pneumococcal 23 valent vaccine  0.5 mL Intramuscular Tomorrow-1000  . sodium bicarbonate  650 mg Oral TID  . vitamin B-12  1,000 mcg Oral Daily   Continuous Infusions: . sodium chloride 100 mL/hr  at 05/15/19 0253  . cefTRIAXone (ROCEPHIN)  IV 1 g (05/14/19 1017)     LOS: 7 days   Time Spent in minutes   45 minutes  Ahijah Devery D.O. on 05/15/2019 at 9:32 AM  Between 7am to 7pm - Please see pager noted on [email or web address]  After 7pm go to [email or web address]  And look for the night coverage person covering for me after hours  [facility] Office  [phone]

## 2019-05-15 NOTE — Plan of Care (Signed)
  Problem: Activity: Goal: Activity intolerance will improve Outcome: Progressing   

## 2019-05-15 NOTE — Progress Notes (Addendum)
CRITICAL VALUE ALERT  Critical Value:  PTT > 200  Date & Time Notied:  05/15/2019 @ 2031  Provider Notified: Beaulah Corin   Orders Received/Actions taken: orders to redraw labs, and to hold heparin gtt.

## 2019-05-16 ENCOUNTER — Other Ambulatory Visit (HOSPITAL_COMMUNITY): Payer: BC Managed Care – PPO

## 2019-05-16 LAB — RENAL FUNCTION PANEL
Albumin: 1.9 g/dL — ABNORMAL LOW (ref 3.5–5.0)
Anion gap: 14 (ref 5–15)
BUN: 31 mg/dL — ABNORMAL HIGH (ref 8–23)
CO2: 17 mmol/L — ABNORMAL LOW (ref 22–32)
Calcium: 8 mg/dL — ABNORMAL LOW (ref 8.9–10.3)
Chloride: 106 mmol/L (ref 98–111)
Creatinine, Ser: 2.8 mg/dL — ABNORMAL HIGH (ref 0.61–1.24)
GFR calc Af Amer: 25 mL/min — ABNORMAL LOW (ref >60)
GFR calc non Af Amer: 22 mL/min — ABNORMAL LOW (ref >60)
Glucose, Bld: 85 mg/dL (ref 70–99)
Phosphorus: 3.7 mg/dL (ref 2.5–4.6)
Potassium: 4 mmol/L (ref 3.5–5.1)
Sodium: 137 mmol/L (ref 135–145)

## 2019-05-16 LAB — APTT
aPTT: >200 seconds (ref 24–36)
aPTT: 182 seconds (ref 24–36)
aPTT: 79 seconds — ABNORMAL HIGH (ref 24–36)

## 2019-05-16 LAB — CBC
HCT: 25.5 % — ABNORMAL LOW (ref 39.0–52.0)
HCT: 23.9 % — ABNORMAL LOW (ref 39.0–52.0)
Hemoglobin: 8 g/dL — ABNORMAL LOW (ref 13.0–17.0)
Hemoglobin: 7.7 g/dL — ABNORMAL LOW (ref 13.0–17.0)
MCH: 28.4 pg (ref 26.0–34.0)
MCH: 28.2 pg (ref 26.0–34.0)
MCHC: 31.4 g/dL (ref 30.0–36.0)
MCHC: 32.2 g/dL (ref 30.0–36.0)
MCV: 90.4 fL (ref 80.0–100.0)
MCV: 87.5 fL (ref 80.0–100.0)
Platelets: 370 10*3/uL (ref 150–400)
Platelets: 352 10*3/uL (ref 150–400)
RBC: 2.82 MIL/uL — ABNORMAL LOW (ref 4.22–5.81)
RBC: 2.73 MIL/uL — ABNORMAL LOW (ref 4.22–5.81)
RDW: 18.5 % — ABNORMAL HIGH (ref 11.5–15.5)
RDW: 18.6 % — ABNORMAL HIGH (ref 11.5–15.5)
WBC: 13.8 10*3/uL — ABNORMAL HIGH (ref 4.0–10.5)
WBC: 10.8 10*3/uL — ABNORMAL HIGH (ref 4.0–10.5)
nRBC: 0 % (ref 0.0–0.2)
nRBC: 0 % (ref 0.0–0.2)

## 2019-05-16 MED ORDER — SALINE SPRAY 0.65 % NA SOLN
1.0000 | NASAL | Status: DC | PRN
  Administered 2019-05-16: 1 via NASAL
  Filled 2019-05-16: qty 44

## 2019-05-16 MED ORDER — HEPARIN (PORCINE) 25000 UT/250ML-% IV SOLN
850.0000 [IU]/h | INTRAVENOUS | Status: DC
  Administered 2019-05-16: 850 [IU]/h via INTRAVENOUS

## 2019-05-16 NOTE — Plan of Care (Signed)
  Problem: Education: Goal: Knowledge of General Education information will improve Description: Including pain rating scale, medication(s)/side effects and non-pharmacologic comfort measures Outcome: Progressing  Sodium bicarb helps kidneys, which regulate fluid and electrolytes.  Problem: Education: Goal: Knowledge of disease and its progression will improve Outcome: Progressing  Education provided regarding staff checking patient frequently for bleeding due to supratherapeutic levels of heparin.  Problem: Activity: Goal: Activity intolerance will improve Outcome: Progressing  Patient up to chair with assistance, denes SOB.

## 2019-05-16 NOTE — Progress Notes (Signed)
Physical Therapy Treatment Patient Details Name: Jeremy Sherman MRN: [id number] DOB: [birth year] Today's Date: 05/16/2019    History of Present Illness 69 year old male admitted to ED on 3/10 with AKI, AMS, R chest wall pain. Pt with foley catheter use x2 weeks. Pt with recent hospitalization for CVA and aneurysm repair, d/c from CIR on 04/21/2019. 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    PT Comments    Pt in bed upon arrival of PT, agreeable to PT session at this time. The pt continues to present with limitations in functional mobility, power, functional strength, and dynamic stability compared to their prior level of function and independence due to above dx. The pt's course is further complicated by cog deficits that limit safety of decision making as well as fear of falling. The pt was able to demo multiple transfers today during session to allow for peri-care, but demos increased anxiety with each stand despite no LOB and constant encouragement. The pt will continue to benefit from skilled PT to improve functional strength in BLE to improve mobility and independence.     Follow Up Recommendations  SNF;Supervision/Assistance - 24 hour     Equipment Recommendations  (defer to post acute)    Recommendations for Other Services       Precautions / Restrictions Precautions Precautions: Fall Restrictions Weight Bearing Restrictions: No    Mobility  Bed Mobility Overal bed mobility: Needs Assistance Bed Mobility: Supine to Sit Rolling: Min guard   Supine to sit: Min assist;HOB elevated     General bed mobility comments: MIn A to advance LE and cues for use of bedrail, provided light HHA for trunk stability/advancement and VCs for hand placement on bed rails  Transfers Overall transfer level: Needs assistance Equipment used:  Rolling walker (2 wheeled) Transfers: Sit to/from Stand Sit to Stand: Min assist;Mod assist;From elevated surface         General transfer comment: minA for initial stand from bed, then completed x3 more for cleaning BM, pt with need for modA from recliner as it is not elevated surface.  Ambulation/Gait Ambulation/Gait assistance: Min assist Gait Distance (Feet): 5 Feet Assistive device: Rolling walker (2 wheeled) Gait Pattern/deviations: Decreased stride length;Trunk flexed;Step-to pattern Gait velocity: decr   General Gait Details: minA for steadying and RW management, pt with no LOB   Stairs             Wheelchair Mobility    Modified Rankin (Stroke Patients Only)       Balance Overall balance assessment: Needs assistance Sitting-balance support: Feet supported;Bilateral upper extremity supported;Single extremity supported Sitting balance-Leahy Scale: Fair     Standing balance support: Bilateral upper extremity supported;During functional activity Standing balance-Leahy Scale: Fair Standing balance comment: Cues for posture correction, Min A initially progressing to Min guard                             Cognition Arousal/Alertness: Awake/alert Behavior During Therapy: WFL for tasks assessed/performed Overall Cognitive Status: Impaired/Different from baseline Area of Impairment: Orientation;Memory;Safety/judgement;Attention;Problem solving                   Current Attention Level: Sustained Memory: Decreased recall of precautions Following Commands: Follows one step commands with increased time;Follows one step commands consistently Safety/Judgement: Decreased awareness of safety;Decreased awareness of deficits   Problem Solving: Slow processing;Decreased initiation;Requires verbal  cues General Comments: Increased time and effort to mobilize, pt having multiple BM this morning, not alerting staff to need to be cleaned stating "I didn't  know who's job it was so I didn't tell them". Pt educated on importance of telling any Lobbyist when he needs to use the restroom      Exercises General Exercises - Lower Extremity Long Arc Quad: Strengthening;10 reps;Seated;Both Hip Flexion/Marching: Strengthening;Both;10 reps;Seated    General Comments        Pertinent Vitals/Pain Pain Assessment: Faces Faces Pain Scale: Hurts little more Pain Location: Bilateral feet Pain Descriptors / Indicators: Sore Pain Intervention(s): Limited activity within patient's tolerance;Monitored during session;Repositioned    Home Living                      Prior Function            PT Goals (current goals can now be found in the care plan section) Acute Rehab PT Goals Patient Stated Goal: get home with wife PT Goal Formulation: With patient Time For Goal Achievement: 05/22/19 Potential to Achieve Goals: Fair Progress towards PT goals: Progressing toward goals    Frequency    Min 2X/week      PT Plan Current plan remains appropriate    Co-evaluation              AM-PAC PT "6 Clicks" Mobility   Outcome Measure  Help needed turning from your back to your side while in a flat bed without using bedrails?: A Little Help needed moving from lying on your back to sitting on the side of a flat bed without using bedrails?: A Little Help needed moving to and from a bed to a chair (including a wheelchair)?: A Little Help needed standing up from a chair using your arms (e.g., wheelchair or bedside chair)?: A Little Help needed to walk in hospital room?: A Little Help needed climbing 3-5 steps with a railing? : A Lot 6 Click Score: 17    End of Session Equipment Utilized During Treatment: Gait belt Activity Tolerance: Patient limited by fatigue Patient left: in chair;with call bell/phone within reach;with chair alarm set;with nursing/sitter in room Nurse Communication: Mobility status(pt with BM) PT Visit  Diagnosis: Other abnormalities of gait and mobility (R26.89);Muscle weakness (generalized) (M62.81)     Time: [id number] PT Time Calculation (min) (ACUTE ONLY): 38 min  Charges:  $Gait Training: 8-22 mins $Therapeutic Exercise: 8-22 mins $Therapeutic Activity: 8-22 mins                     Karma Ganja, PT, DPT   Acute Rehabilitation Department Pager #: [phone]   Otho Bellows 05/16/2019, 3:07 PM

## 2019-05-16 NOTE — Progress Notes (Signed)
ANTICOAGULATION CONSULT NOTE - Follow Up Consult  Pharmacy Consult for Apixaban to Heparin Indication: LA thrombus  No Known Allergies  Patient Measurements: Height: 6\' 5"  (195.6 cm) Weight: 215 lb 13.3 oz (97.9 kg) IBW/kg (Calculated) : 89.1  Heparin Dosing Weight:  97.9 kg  Vital Signs: Temp: 97.8 F (36.6 C) (03/19 0906) Temp Source: Oral (03/19 0906) BP: 133/92 (03/19 0906) Pulse Rate: 75 (03/19 0906)  Labs: Recent Labs    05/14/19 0247 05/15/19 0320 05/15/19 0320 05/15/19 1427 05/15/19 1902 05/15/19 2139 05/16/19 0347 05/16/19 0828  HGB  --  7.6*   < > 7.7*  --   --  8.0*  --   HCT  --  24.8*  --  24.5*  --   --  25.5*  --   PLT  --  382  --   --   --   --  370  --   APTT  --   --   --   --  >200* >200*  --  >200*  HEPARINUNFRC  --   --   --   --  >2.20* >2.20*  --   --   CREATININE 2.79* 2.70*  --   --   --   --  2.80*  --    < > = values in this interval not displayed.    Estimated Creatinine Clearance: 30.9 mL/min (A) (by C-G formula based on SCr of 2.8 mg/dL (H)).  Assessment: 69 year old male with history of MCA stenosis (S/P stent in 1/21) with LA thrombus on Eliquis prior to admission.  Now to begin heparin while Eliquis on hold in anticipation of renal biopsy.  Last dose of Eliquis was on 3/17 PM.   PTT remains elevated. Drawn appropriately from arm opposite where heparin running. No bleeding noted.  Goal of Therapy:  aPTT 66-102 seconds Monitor platelets by anticoagulation protocol: Yes  Heparin level 0.3-0.7 units/hr   Plan:  Heparin turned off Will check PTT at noon before restarting  Thank you Anette Guarneri, PharmD Please see amion for complete clinical pharmacist phone list 05/16/2019,10:16 AM

## 2019-05-16 NOTE — TOC Progression Note (Addendum)
Transition of Care [facility]) - Progression Note    Patient Details  Name: Devarsh Cattanach MRN: [id number] Date of Birth: 10/31/1950  Transition of Care [facility]) CM/SW Contact  Bartholomew Crews, RN Phone Number: [phone] 05/16/2019, 3:39 PM  Clinical Narrative:    Damaris Schooner with Olivia Mackie at [facility]. They can offer bed to patient for Monday or Tuesday next week pending insurance authorization (BCBS PPO with Medicare A secondary). Patient and spouse had both stated preference for [facility]. Left voicemail for spouse to return call to discuss bed offers.   Update: spoke with MD - patient is pending renal biopsy per nephrology. Spouse had stated earlier today that she thought it was for Monday. SNF aware. SNF advised that patient will have 30% coinsurance for his rehab. Pending call back from spouse and will discuss bed offer and coinsurance.  Update: Received call back from [facility]. Unable to offer bed d/t out of network with [facility].   Update: Spoke with patient at bedside. Advised of bed offer for [facility]. Patient stated that his wife had already left for the day.   Spoke with spouse on the phone to discuss bed offers. She is agreeable to [facility], but unsure about 30% coinsurance. NCM to f/u with SNF for specific numbers next week. [facility] initiating auth.   TOC team following for transition needs.       Expected Discharge Plan and Services                                                 Social Determinants of Health (SDOH) Interventions    Readmission Risk Interventions No flowsheet data found.

## 2019-05-16 NOTE — Progress Notes (Signed)
ANTICOAGULATION CONSULT NOTE - Follow Up Consult  Pharmacy Consult for Apixaban to Heparin Indication: LA thrombus  No Known Allergies  Patient Measurements: Height: 6\' 5"  (195.6 cm) Weight: 215 lb 13.3 oz (97.9 kg) IBW/kg (Calculated) : 89.1  Heparin Dosing Weight:  97.9 kg  Vital Signs: Temp: 97.8 F (36.6 C) (03/19 0906) Temp Source: Oral (03/19 0906) BP: 133/92 (03/19 0906) Pulse Rate: 75 (03/19 0906)  Labs: Recent Labs    05/14/19 0247 05/15/19 0320 05/15/19 0320 05/15/19 1427 05/15/19 1902 05/15/19 1902 05/15/19 2139 05/16/19 0347 05/16/19 0828 05/16/19 1249  HGB  --  7.6*   < > 7.7*  --   --   --  8.0*  --   --   HCT  --  24.8*  --  24.5*  --   --   --  25.5*  --   --   PLT  --  382  --   --   --   --   --  370  --   --   APTT  --   --   --   --  >200*   < > >200*  --  >200* 182*  HEPARINUNFRC  --   --   --   --  >2.20*  --  >2.20*  --   --   --   CREATININE 2.79* 2.70*  --   --   --   --   --  2.80*  --   --    < > = values in this interval not displayed.    Estimated Creatinine Clearance: 30.9 mL/min (A) (by C-G formula based on SCr of 2.8 mg/dL (H)).  Assessment: 69 year old male with history of MCA stenosis (S/P stent in 1/21) with LA thrombus on Eliquis prior to admission.  Now to begin heparin while Eliquis on hold in anticipation of renal biopsy.  Last dose of Eliquis was on 3/17 PM.   PTT remains elevated. Drawn appropriately from arm opposite where heparin running. No bleeding noted.  Goal of Therapy:  aPTT 66-102 seconds Monitor platelets by anticoagulation protocol: Yes  Heparin level 0.3-0.7 units/hr   Plan:  Heparin off since 10 : 30 am Recheck PTT at 8 pm   Thank you Anette Guarneri, PharmD Please see amion for complete clinical pharmacist phone list 05/16/2019,3:26 PM

## 2019-05-16 NOTE — Progress Notes (Signed)
[facility] Progress Note    Assessment/ Plan:   Pt is a 69 y.o. yo male with HTN, CAD who was admitted on 05/07/2019 with AKI (crt 1.08 on 04/21/19)  Assessment/Plan: 1. Renal-  True AKI from normal crt on 04/21/19.  Left hospital with indwelling foley-  Has stayed in place and changed out per home health on Monday.  Many new meds per cardiology including brilinta and a statin.  U/A showing 100 of prot, white and red blood cells- possible UTI but no growth on culture.  Renal u/s normal with 12 cm kidneys.  Is non oliguric. BP has not been low.  CK 78.  ANA negative, complements WNLand ANCA negative. Protein to crt ratio low. Right now non oliguric and crt trending better.  Will do SPEP/ free light chains--> M-spike 1.0. has not had + urine culture in 2021.  Since Cr hasn't improved as expected and SPEP resulted with M-spike- think we need renal biopsy.  Have ordered, appreciate IR assistance, for Monday 2. + M-spike- would consider heme c/s too 3.  Fever/ vomiting: UA/ culture redrawn, blood cultures done, restarted ceftriaxone. AXR--> negative  4. HTN/volume-  Appeared dry if anything-  Had not been eating well-  Was getting IVF -  Now BP is up have stopped 5 Anemia-  Significant-  Low iron stores-  First feraheme dose 3/12, s/p 3/15. Recheck pending  6 Metabolic acidosis - on oral bicarb - improving some 7.  Dispo: Since Cr has stalled out and M-spike resulted 1.0, would like to keep to complete workup  Subjective:    No overall change.  Cr at 2.8.  Plan for renal biopsy Monday  Objective:   BP (!) 133/92 (BP Location: Right Arm)   Pulse 75   Temp 97.8 F (36.6 C) (Oral)   Resp 20   Ht 6\' 5"  (1.956 m)   Wt 97.9 kg   SpO2 100%   BMI 25.59 kg/m   Intake/Output Summary (Last 24 hours) at 05/16/2019 1654 Last data filed at 05/16/2019 1000 Gross per 24 hour  Intake 180.85 ml  Output 675 ml  Net -494.15 ml   Weight change:   Physical Exam: Gen: sitting in bed,  NAD CVS: RRR Resp: clear Abd: soft, nontender Ext:no LE edema  Imaging: No results found.  Labs: BMET Recent Labs  Lab 05/10/19 0512 05/11/19 0405 05/12/19 0302 05/13/19 0328 05/14/19 0247 05/15/19 0320 05/16/19 0347  NA 138 137 141 137 137 136 137  K 4.0 3.7 3.8 4.1 3.9 4.1 4.0  CL 108 109 109 107 107 107 106  CO2 20* 18* 21* 18* 20* 18* 17*  GLUCOSE 89 95 102* 93 95 113* 85  BUN 46* 41* 40* 36* 33* 32* 31*  CREATININE 4.27* 3.71* 3.39* 2.92* 2.79* 2.70* 2.80*  CALCIUM 7.9* 7.9* 7.9* 8.1* 8.1* 7.7* 8.0*  PHOS 5.0* 4.4 4.3 3.8 4.0 4.1 3.7   CBC Recent Labs  Lab 05/12/19 0302 05/12/19 0302 05/13/19 0328 05/15/19 0320 05/15/19 1427 05/16/19 0347  WBC 12.0*  --  14.3* 14.0*  --  13.8*  HGB 8.2*   < > 8.4* 7.6* 7.7* 8.0*  HCT 25.9*   < > 26.8* 24.8* 24.5* 25.5*  MCV 89.9  --  93.4 91.5  --  90.4  PLT 354  --  375 382  --  370   < > = values in this interval not displayed.    Medications:    . atorvastatin  80 mg Oral  q1800  . Chlorhexidine Gluconate Cloth  6 each Topical Daily  . feeding supplement  1 Container Oral TID BM  . feeding supplement (PRO-STAT SUGAR FREE 64)  30 mL Oral TID WC  . influenza vaccine adjuvanted  0.5 mL Intramuscular Tomorrow-1000  . levothyroxine  100 mcg Oral Q0600  . lidocaine  1 application Urethral Once  . pneumococcal 23 valent vaccine  0.5 mL Intramuscular Tomorrow-1000  . sodium bicarbonate  650 mg Oral TID  . vitamin B-12  1,000 mcg Oral Daily      Madelon Lips, MD

## 2019-05-16 NOTE — Progress Notes (Signed)
PROGRESS NOTE    Jeremy Sherman  [id number] DOB: [birth year] DOA: 05/07/2019 PCP: Imagene Riches, NP   Brief Narrative:  HPI On 05/08/2019 by Dr. Shela Leff Jeremy Sherman is a 69 y.o. male with medical history significant of CAD status post CABG, hypertension, hyperlipidemia, hypothyroidism, prediabetes, recent hospital admission for stroke, carotid artery disease status post stent on Eliquis and Brilinta, left atrial appendage thrombus presenting to the ED for evaluation of abnormal labs.  Patient was called by his physician after outpatient labs revealed worsening renal failure.  Patient appears confused and is not sure why he is here.  He has no complaints.  Denies fevers, chills, chest pain, cough, shortness of breath, nausea, vomiting, abdominal pain, diarrhea, flank pain, or dysuria.  No family available at this time.  Per ED provider's conversation with the patient's wife since his discharge from rehab 2 weeks ago he has had slowly worsening confusion.  He has an indwelling Foley catheter since his prior hospitalization which has been draining urine.  Interim history Patient found to have acute kidney injury and nephrology consulted and following.  Also developed fever with worsening leukocytosis, work-up thus far has been unremarkable.  There was initial concern of chordee when he was first admitted however repeat urine cultures show no growth. Assessment & Plan   Acute kidney injury -Creatinine on admission 4.72 (baseline approximately 1) -Today creatinine down to 2.80 -Nephrology consulted and appreciated- discussed with Dr. Hollie Salk, planning for renal biopsy- IR consulted -Renal ultrasound unremarkable -ANCA negative -Patient was placed on IV fluids -Continue to monitor BMP  M spike -patient noted to have M spike and pending renal biopsy -will discuss with oncology  Complicated UTI/pyelonephritis/pyuria with hematuria in a patient with chronic  indwelling Foley catheter due to urinary retention and BPH -Urine culture initially unremarkable.  Repeat urine culture on 05/13/2019 show no growth  -Foley catheter was changed on 05/13/2019 -Blood cultures show no growth to date -patient was placed on IV ceftriaxone   Acute metabolic encephalopathy -Suspect some underlying cognitive impairment -Multifactorial including dehydration, AKI, hyperammonemia, low B12, tramadol use -Appears to be improving, although unsure patient's baseline  Normocytic anemia -Certainly iron deficiency anemia versus anemia of chronic disease -Hemoglobin currently 8 -Iron saturation 9, B12 142 -Was given B12 supplementation along with IV iron and ESA per nephrology -FOBT pending -Of note, patient on Brilinta and Eliquis- will hold for renal biopsy and place on heparin  History of CVA -Left CAS/left MCA stenosis status post left CEA and left MCA stent in January 2021.  On Brilinta.  Patient also noted to have a left atrial appendage thrombus was placed on Eliquis. -Currently no focal neuro deficits other than lower extremity bilateral weakness.  Patient was discharged home from rehab on 04/24/2019. -Continue Brilinta, Eliquis, statin- as above- hold Brilinta and Eliquis  -PT, OT recommending SNF  History of coronary artery disease -Patient with CABG in 2019 -Currently stable, no complaints of chest pain  Essential hypertension -Currently on no meds, will add on meds as needed  Hypothyroidism -Continue Synthroid -TSH 6.5- however this was a hemolyzed sample  Metabolic acidosis  -continue sodium bicarb  Hyponatremia -Resolved  Hyperphosphatemia -Likely due to renal failure -Treatment per nephrology  Generalized weakness with debility -PT and OT as above  Chronic pain -Continue scheduled Tylenol, avoid sedating medications   GERD -Continue PPI  DVT Prophylaxis Eliquis--> Heparin  Code Status: Full  Family Communication: None at  bedside  Disposition Plan: Admitted from home  for acute metabolic encephalopathy along with acute kidney injury.  Pending renal biopsy.  Suspect disposition to SNF when stable.   Consultants Nephrology Interventional radiology  Procedures  Renal ultrasound  Antibiotics   Anti-infectives (From admission, onward)   Start     Dose/Rate Route Frequency Ordered Stop   05/13/19 1045  cefTRIAXone (ROCEPHIN) 1 g in sodium chloride 0.9 % 100 mL IVPB     1 g 200 mL/hr over 30 Minutes Intravenous Every 24 hours 05/13/19 1036     05/08/19 0130  cefTRIAXone (ROCEPHIN) 1 g in sodium chloride 0.9 % 100 mL IVPB  Status:  Discontinued     1 g 200 mL/hr over 30 Minutes Intravenous Daily at bedtime 05/08/19 0100 05/09/19 [id number]      Subjective:   Jeremy Sherman seen and examined today.  No complaints this morning.  States he feels very sleepy.  Denies chest pain or shortness of breath, abdominal pain, nausea or vomiting, headache or dizziness.    Objective:   Vitals:   05/15/19 0911 05/15/19 1748 05/15/19 2144 05/16/19 0446  BP: 129/73 (!) 141/79 136/61 (!) 156/73  Pulse: 82 79 77 81  Resp: 20 (!) 21 18 18   Temp: 98 F (36.7 C) 98.2 F (36.8 C) 99.1 F (37.3 C) 98.2 F (36.8 C)  TempSrc: Oral Oral Oral Oral  SpO2: 96% 94% 92% 94%  Weight:      Height:        Intake/Output Summary (Last 24 hours) at 05/16/2019 0901 Last data filed at 05/16/2019 0800 Gross per 24 hour  Intake 1092.26 ml  Output 1325 ml  Net -232.74 ml   Filed Weights   05/10/19 2113 05/11/19 2031 05/13/19 2059  Weight: 98 kg 97.6 kg 97.9 kg   Exam  General: Well developed, chronically ill appearing, NAD  HEENT: NCAT, mucous membranes moist.   Cardiovascular: S1 S2 auscultated, RRR  Respiratory: Clear to auscultation bilaterally   Abdomen: Soft, nontender, nondistended, + bowel sounds  Extremities: warm dry without cyanosis clubbing or edema  Neuro: AAOx2 (self, place), nonfocal  Psych: Appropriate mood  and affect, pleasantly confused  Data Reviewed: I have personally reviewed following labs and imaging studies  CBC: Recent Labs  Lab 05/11/19 0405 05/11/19 0405 05/12/19 0302 05/13/19 0328 05/15/19 0320 05/15/19 1427 05/16/19 0347  WBC 9.2  --  12.0* 14.3* 14.0*  --  13.8*  HGB 8.1*   < > 8.2* 8.4* 7.6* 7.7* 8.0*  HCT 25.5*   < > 25.9* 26.8* 24.8* 24.5* 25.5*  MCV 88.2  --  89.9 93.4 91.5  --  90.4  PLT 349  --  354 375 382  --  370   < > = values in this interval not displayed.   Basic Metabolic Panel: Recent Labs  Lab 05/10/19 0512 05/10/19 0512 05/11/19 0405 05/11/19 0405 05/12/19 0302 05/13/19 0328 05/14/19 0247 05/15/19 0320 05/16/19 0347  NA 138   < > 137   < > 141 137 137 136 137  K 4.0   < > 3.7   < > 3.8 4.1 3.9 4.1 4.0  CL 108   < > 109   < > 109 107 107 107 106  CO2 20*   < > 18*   < > 21* 18* 20* 18* 17*  GLUCOSE 89   < > 95   < > 102* 93 95 113* 85  BUN 46*   < > 41*   < > 40* 36* 33* 32* 31*  CREATININE 4.27*   < > 3.71*   < > 3.39* 2.92* 2.79* 2.70* 2.80*  CALCIUM 7.9*   < > 7.9*   < > 7.9* 8.1* 8.1* 7.7* 8.0*  MG 2.8*  --  2.5*  --  2.4 2.4  --   --   --   PHOS 5.0*   < > 4.4   < > 4.3 3.8 4.0 4.1 3.7   < > = values in this interval not displayed.   GFR: Estimated Creatinine Clearance: 30.9 mL/min (A) (by C-G formula based on SCr of 2.8 mg/dL (H)). Liver Function Tests: Recent Labs  Lab 05/12/19 0302 05/13/19 0328 05/14/19 0247 05/15/19 0320 05/16/19 0347  ALBUMIN 2.1* 2.1* 1.9* 1.9* 1.9*   No results for input(s): LIPASE, AMYLASE in the last 168 hours. No results for input(s): AMMONIA in the last 168 hours. Coagulation Profile: No results for input(s): INR, PROTIME in the last 168 hours. Cardiac Enzymes: No results for input(s): CKTOTAL, CKMB, CKMBINDEX, TROPONINI in the last 168 hours. BNP (last 3 results) No results for input(s): PROBNP in the last 8760 hours. HbA1C: No results for input(s): HGBA1C in the last 72 hours. CBG: No  results for input(s): GLUCAP in the last 168 hours. Lipid Profile: No results for input(s): CHOL, HDL, LDLCALC, TRIG, CHOLHDL, LDLDIRECT in the last 72 hours. Thyroid Function Tests: No results for input(s): TSH, T4TOTAL, FREET4, T3FREE, THYROIDAB in the last 72 hours. Anemia Panel: No results for input(s): VITAMINB12, FOLATE, FERRITIN, TIBC, IRON, RETICCTPCT in the last 72 hours. Urine analysis:    Component Value Date/Time   COLORURINE YELLOW 05/13/2019 0954   APPEARANCEUR HAZY (A) 05/13/2019 0954   LABSPEC 1.016 05/13/2019 0954   PHURINE 6.0 05/13/2019 0954   GLUCOSEU NEGATIVE 05/13/2019 0954   HGBUR LARGE (A) 05/13/2019 0954   BILIRUBINUR NEGATIVE 05/13/2019 0954   [place] 05/13/2019 0954   PROTEINUR 100 (A) 05/13/2019 0954   NITRITE NEGATIVE 05/13/2019 0954   LEUKOCYTESUR LARGE (A) 05/13/2019 0954   Sepsis Labs: @LABRCNTIP (procalcitonin:4,lacticidven:4)  ) Recent Results (from the past 240 hour(s))  SARS CORONAVIRUS 2 (TAT 6-24 HRS) Nasopharyngeal Nasopharyngeal Swab     Status: None   Collection Time: 05/07/19  8:48 PM   Specimen: Nasopharyngeal Swab  Result Value Ref Range Status   SARS Coronavirus 2 NEGATIVE NEGATIVE Final    Comment: (NOTE) SARS-CoV-2 target nucleic acids are NOT DETECTED. The SARS-CoV-2 RNA is generally detectable in upper and lower respiratory specimens during the acute phase of infection. Negative results do not preclude SARS-CoV-2 infection, do not rule out co-infections with other pathogens, and should not be used as the sole basis for treatment or other patient management decisions. Negative results must be combined with clinical observations, patient history, and epidemiological information. The expected result is Negative. Fact Sheet for Patients: SugarRoll.be Fact Sheet for Healthcare Providers: https://www.woods-mathews.com/ This test is not yet approved or cleared by the [place]  FDA and  has been authorized for detection and/or diagnosis of SARS-CoV-2 by FDA under an Emergency Use Authorization (EUA). This EUA will remain  in effect (meaning this test can be used) for the duration of the COVID-19 declaration under Section 56 4(b)(1) of the Act, 21 U.S.C. section 360bbb-3(b)(1), unless the authorization is terminated or revoked sooner. Performed at [facility], [place] [address] [place], [place] [address]   Urine culture     Status: None   Collection Time: 05/07/19 11:45 PM   Specimen: Urine, Random  Result Value Ref  Range Status   Specimen Description URINE, RANDOM  Final   Special Requests NONE  Final   Culture   Final    NO GROWTH Performed at [facility], [address] [place], [place] [address]    Report Status 05/08/2019 FINAL  Final  Culture, blood (routine x 2)     Status: None   Collection Time: 05/08/19  1:20 AM   Specimen: BLOOD  Result Value Ref Range Status   Specimen Description BLOOD RIGHT ARM  Final   Special Requests   Final    BOTTLES DRAWN AEROBIC AND ANAEROBIC Blood Culture adequate volume   Culture   Final    NO GROWTH 5 DAYS Performed at [facility], [place] [address], [place], [place] [address]    Report Status 05/13/2019 FINAL  Final  Culture, blood (routine x 2)     Status: None   Collection Time: 05/08/19  1:20 AM   Specimen: BLOOD  Result Value Ref Range Status   Specimen Description BLOOD LEFT ARM  Final   Special Requests   Final    BOTTLES DRAWN AEROBIC AND ANAEROBIC Blood Culture adequate volume   Culture   Final    NO GROWTH 5 DAYS Performed at [facility], [place] [address], [place], [place] [address]    Report Status 05/13/2019 FINAL  Final  Culture, blood (routine x 2)     Status: None (Preliminary result)   Collection Time: 05/13/19 10:04 AM   Specimen: BLOOD  Result Value Ref Range Status   Specimen Description BLOOD RIGHT ANTECUBITAL  Final   Special Requests   Final    AEROBIC  BOTTLE ONLY Blood Culture results may not be optimal due to an inadequate volume of blood received in culture bottles   Culture   Final    NO GROWTH 2 DAYS Performed at [facility], [place] [address], [place], [place] [address]    Report Status PENDING  Incomplete  Culture, blood (routine x 2)     Status: None (Preliminary result)   Collection Time: 05/13/19 10:30 AM   Specimen: BLOOD RIGHT ARM  Result Value Ref Range Status   Specimen Description BLOOD RIGHT ARM  Final   Special Requests   Final    AEROBIC BOTTLE ONLY Blood Culture results may not be optimal due to an inadequate volume of blood received in culture bottles   Culture   Final    NO GROWTH 2 DAYS Performed at [facility], [place] [address], [place], [place] [address]    Report Status PENDING  Incomplete  Culture, Urine     Status: None   Collection Time: 05/13/19  6:58 PM   Specimen: Urine, Catheterized  Result Value Ref Range Status   Specimen Description URINE, CATHETERIZED  Final   Special Requests NONE  Final   Culture   Final    NO GROWTH Performed at [facility], [address], [place], [place] [address]    Report Status 05/14/2019 FINAL  Final      Radiology Studies: No results found.   Scheduled Meds: . atorvastatin  80 mg Oral q1800  . Chlorhexidine Gluconate Cloth  6 each Topical Daily  . feeding supplement  1 Container Oral TID BM  . feeding supplement (PRO-STAT SUGAR FREE 64)  30 mL Oral TID WC  . influenza vaccine adjuvanted  0.5 mL Intramuscular Tomorrow-1000  . levothyroxine  100 mcg Oral Q0600  .  lidocaine  1 application Urethral Once  . pneumococcal 23 valent vaccine  0.5 mL Intramuscular Tomorrow-1000  . sodium bicarbonate  650 mg Oral TID  . vitamin B-12  1,000 mcg Oral Daily   Continuous Infusions: . cefTRIAXone (ROCEPHIN)  IV 1 g (05/15/19 1026)  . heparin 850 Units/hr (05/16/19 0036)     LOS: 8 days   Time Spent in minutes   45 minutes  Tuyen Uncapher D.O.  on 05/16/2019 at 9:01 AM  Between 7am to 7pm - Please see pager noted on [email or web address]  After 7pm go to [email or web address]  And look for the night coverage person covering for me after hours  [facility] Office  [phone]

## 2019-05-16 NOTE — NC FL2 (Signed)
[place] LEVEL OF CARE SCREENING TOOL     IDENTIFICATION  Patient Name: Jeremy Sherman Birthdate: Oct 18, 1950 Sex: male Admission Date (Current Location): 05/07/2019  [facility] and [place] Number:  Herbalist and Address:  [facility] [place], [place] [address], [place], [place] [address]      Provider Number: [id number]  Attending Physician Name and Address:  Cristal Ford, DO  Relative Name and Phone Number:  Havier Kellar, wife   [phone]    Current Level of Care: Hospital Recommended Level of Care: [place] Prior Approval Number:    Date Approved/Denied: 05/16/19 PASRR Number: [id number]  Discharge Plan: SNF    Current Diagnoses: Patient Active Problem List   Diagnosis Date Noted  . UTI (urinary tract infection) due to urinary indwelling catheter ([place]) 05/08/2019  . Acute metabolic encephalopathy [id number]  . QT prolongation 05/08/2019  . Orthostatic hypotension   . Chronic diastolic congestive heart failure ([place])   . Hypoalbuminemia due to protein-calorie malnutrition ([place])   . Acute blood loss anemia   . Transaminitis   . Diabetes mellitus type 2 in obese ([place])   . Benign prostatic hyperplasia with urinary retention   . Hypothyroidism   . 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 AKI (acute kidney injury) ([place])   . Aneurysm of anterior cerebral artery   . Essential hypertension   . Prediabetes   . Dysphagia, post-stroke   . Thrombus of left atrial appendage   . 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 Carotid stenosis 03/21/2019  . CAD (coronary artery disease) 03/21/2019  . Brain aneurysm 03/21/2019  . Stroke ([place]) 03/20/2019  . Grade II diastolic dysfunction 0000000  . Dizziness 03/11/2019  . Hypertensive urgency 03/11/2019    Orientation RESPIRATION BLADDER Height & Weight     Self, Time, Situation, Place  Normal Continent Weight: 97.9 kg Height:  6\' 5"   (195.6 cm)  BEHAVIORAL SYMPTOMS/MOOD NEUROLOGICAL BOWEL NUTRITION STATUS      Continent Diet  AMBULATORY STATUS COMMUNICATION OF NEEDS Skin   Extensive Assist Verbally Normal                       Personal Care Assistance Level of Assistance  Bathing, Feeding, Dressing Bathing Assistance: Maximum assistance Feeding assistance: Limited assistance Dressing Assistance: Maximum assistance     Functional Limitations Info  Sight, Speech, Hearing Sight Info: Adequate Hearing Info: Adequate Speech Info: Adequate    SPECIAL CARE FACTORS FREQUENCY  PT (By licensed PT), OT (By licensed OT)     PT Frequency: PT at SNF to eval and treat a min of 5x/week OT Frequency: OT at SNF to eval and treat a min of 5x/week            Contractures Contractures Info: Not present    Additional Factors Info  Code Status, Allergies Code Status Info: Full Allergies Info: NKDA           Current Medications (05/16/2019):  This is the current hospital active medication list Current Facility-Administered Medications  Medication Dose Route Frequency Provider Last Rate Last Admin  . acetaminophen (TYLENOL) tablet 650 mg  650 mg Oral Q6H PRN Dana Allan I, MD   650 mg at 05/16/19 0424  . atorvastatin (LIPITOR) tablet 80 mg  80 mg Oral q1800 Wendee Beavers T, MD   80 mg at 05/15/19 1720  . cefTRIAXone (ROCEPHIN) 1 g in sodium chloride 0.9 % 100 mL IVPB  1 g Intravenous Q24H Dana Allan I, MD 200 mL/hr at 05/16/19 1021 1 g at 05/16/19 1021  . Chlorhexidine Gluconate Cloth 2 % PADS 6 each  6 each Topical Daily Mercy Riding, MD   6 each at 05/15/19 1029  . feeding supplement (BOOST / RESOURCE BREEZE) liquid 1 Container  1 Container Oral TID BM Cristal Ford, DO   1 Container at 05/15/19 2134  . feeding supplement (PRO-STAT SUGAR FREE 64) liquid 30 mL  30 mL Oral TID WC Mikhail, [place], DO      . HYDROcodone-acetaminophen (NORCO/VICODIN) 5-325 MG per tablet 1 tablet  1 tablet Oral Once PRN  Kirby-Graham, Karsten Fells, NP      . influenza vaccine adjuvanted (FLUAD) injection 0.5 mL  0.5 mL Intramuscular Tomorrow-1000 Corliss Parish, MD      . levothyroxine (SYNTHROID) tablet 100 mcg  100 mcg Oral Q0600 Wendee Beavers T, MD   100 mcg at 05/16/19 0605  . lidocaine (XYLOCAINE) 2 % jelly 1 application  1 application Urethral Once Dana Allan I, MD      . ondansetron [facility]) injection 4 mg  4 mg Intravenous Q8H PRN Wendee Beavers T, MD   4 mg at 05/14/19 2225  . pneumococcal 23 valent vaccine (PNEUMOVAX-23) injection 0.5 mL  0.5 mL Intramuscular Tomorrow-1000 Corliss Parish, MD      . promethazine (PHENERGAN) injection 12.5 mg  12.5 mg Intravenous Q8H PRN Wendee Beavers T, MD   12.5 mg at 05/13/19 1058  . sodium bicarbonate tablet 650 mg  650 mg Oral TID Claudia Desanctis, MD   650 mg at 05/15/19 2135  . sodium chloride (OCEAN) 0.65 % nasal spray 1 spray  1 spray Each Nare PRN Omar Person, NP   1 spray at 05/16/19 0136  . vitamin B-12 (CYANOCOBALAMIN) tablet 1,000 mcg  1,000 mcg Oral Daily Wendee Beavers T, MD   1,000 mcg at 05/15/19 1028     Discharge Medications: Please see discharge summary for a list of discharge medications.  Relevant Imaging Results:  Relevant Lab Results:   Additional Information SSN [id number]  Bartholomew Crews, RN

## 2019-05-16 NOTE — TOC Progression Note (Signed)
Transition of Care [facility]) - Progression Note    Patient Details  Name: Jeremy Sherman MRN: [id number] Date of Birth: [birth year]  [facility] CM/SW Contact  Bartholomew Crews, RN Phone Number: [phone] 05/16/2019, 11:08 AM  Clinical Narrative:    Spoke with patient at the bedside and confirmed desire for SNF when ready to dc. Patient advised NCM to discuss with his wife.   Spoke with patient spouse, Jeremy Sherman, on the phone. She confirmed that she is agreeable for SNF for STR. Reviewed SNF process. Jeremy Sherman stated that her brother is at [facility] right now. Both patient and spouse prefer that patient go to a facility in [facility] if possible.   Spouse stated that patient is pending a renal biopsy on Monday. She asked when patient would be ready to go. NCM advised that MD would determine when patient was medically stable.  PASRR received. FL2 completed and faxed to area SNFs. Will follow up with patient and spouse for bed offers.   TOC following for transition needs.       Expected Discharge Plan and Services                                                 Social Determinants of Health (SDOH) Interventions    Readmission Risk Interventions No flowsheet data found.

## 2019-05-16 NOTE — Progress Notes (Signed)
ANTICOAGULATION CONSULT NOTE - Follow Up Consult  Pharmacy Consult for Apixaban to Heparin Indication: LA thrombus  No Known Allergies  Patient Measurements: Height: _0  (195.6 cm) Weight: 215 lb 13.3 oz (97.9 kg) IBW/kg (Calculated) : 89.1  Heparin Dosing Weight:  97.9 kg  Vital Signs: Temp: 99.1 F (37.3 C) (03/18 2144) Temp Source: Oral (03/18 2144) BP: 136/61 (03/18 2144) Pulse Rate: 77 (03/18 2144)  Labs: Recent Labs    05/13/19 0328 05/13/19 0328 05/14/19 0247 05/15/19 0320 05/15/19 1427 05/15/19 1902 05/15/19 2139  HGB 8.4*   < >  --  7.6* 7.7*  --   --   HCT 26.8*  --   --  24.8* 24.5*  --   --   PLT 375  --   --  382  --   --   --   APTT  --   --   --   --   --  >200* >200*  HEPARINUNFRC  --   --   --   --   --  >2.20* >2.20*  CREATININE 2.92*  --  2.79* 2.70*  --   --   --    < > = values in this interval not displayed.    Estimated Creatinine Clearance: 32.1 mL/min (A) (by C-G formula based on SCr of 2.7 mg/dL (H)).  Assessment: 69 year old male with history of MCA stenosis (S/P stent in 1/21) with LA thrombus on Eliquis prior to admission.  Now to begin heparin while Eliquis on hold in anticipation of bone marrow biopsy.  Last dose of Eliquis was on 3/17 PM.   Will use aPTT to guide heparin dosing until heparin level and aPTT correlate.  Repeat PTT remains >200 (drawn ~50 min after heparin stopped). Drawn appropriately from arm opposite where heparin running. No bleeding noted.  Goal of Therapy:  aPTT 66-102 seconds Monitor platelets by anticoagulation protocol: Yes  Heparin level 0.3-0.7 units/hr   Plan:  Heparin has been held for total of 3.5 hours at this point (2 hours past last PTT draw) Restart heparin at reduced rate of 850 units/hr Will f/u 8hr PTT  Sherlon Handing, PharmD, BCPS Please see amion for complete clinical pharmacist phone list 05/16/2019,12:15 AM

## 2019-05-16 NOTE — Progress Notes (Addendum)
ANTICOAGULATION CONSULT NOTE - Follow Up Consult  Pharmacy Consult for Apixaban to Heparin Indication: LA thrombus  No Known Allergies  Patient Measurements: Height: 6\' 5"  (195.6 cm) Weight: 215 lb 13.3 oz (97.9 kg) IBW/kg (Calculated) : 89.1  Heparin Dosing Weight:  97.9 kg  Vital Signs: Temp: 98.5 F (36.9 C) (03/19 1806) Temp Source: Oral (03/19 1806) BP: 137/87 (03/19 1806) Pulse Rate: 94 (03/19 1806)  Labs: Recent Labs    05/14/19 0247 05/15/19 0320 05/15/19 0320 05/15/19 1427 05/15/19 1427 05/15/19 1902 05/15/19 1902 05/15/19 2139 05/15/19 2139 05/16/19 0347 05/16/19 0828 05/16/19 1249 05/16/19 1959  HGB  --  7.6*   < > 7.7*   < >  --   --   --   --  8.0*  --   --  7.7*  HCT  --  24.8*   < > 24.5*  --   --   --   --   --  25.5*  --   --  23.9*  PLT  --  382  --   --   --   --   --   --   --  370  --   --  352  APTT  --   --   --   --   --  >200*   < > >200*   < >  --  >200* 182* 79*  HEPARINUNFRC  --   --   --   --   --  >2.20*  --  >2.20*  --   --   --   --   --   CREATININE 2.79* 2.70*  --   --   --   --   --   --   --  2.80*  --   --   --    < > = values in this interval not displayed.    Estimated Creatinine Clearance: 30.9 mL/min (A) (by C-G formula based on SCr of 2.8 mg/dL (H)).  Assessment: 69 year old male with history of MCA stenosis (S/P stent in 1/21) with LA thrombus on Eliquis prior to admission.  Now to begin heparin while Eliquis on hold in anticipation of renal biopsy.  Last dose of Eliquis was on 3/17 PM.  -aPTT= 79 (heparin has been off since 10:30am) -Hg= 7.7 (low/stable)  Goal of Therapy:  aPTT 66-102 seconds Monitor platelets by anticoagulation protocol: Yes  Heparin level 0.3-0.7 units/hr   Plan:  -No heparin now -Recheck an aPTT at midnight  Hildred Laser, PharmD Clinical Pharmacist **Pharmacist phone directory can now be found on [email or web address] ([email or web address]).  Listed under [place].

## 2019-05-16 NOTE — Plan of Care (Signed)
  Problem: Activity: Goal: Activity intolerance will improve Outcome: Progressing   

## 2019-05-16 NOTE — Progress Notes (Signed)
Occupational Therapy Treatment Patient Details Name: Jeremy Sherman MRN: [id number] DOB: [birth year] Today's Date: 05/16/2019    History of present illness 69 year old male admitted to ED on 3/10 with AKI, AMS, R chest wall pain. Pt with foley catheter use x2 weeks. Pt with recent hospitalization for CVA and aneurysm repair, d/c from CIR on 04/21/2019. 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   OT comments  Pt making steady progress with functional goals. Session focused on bed mobility to sit EOB for grooming, UB and LB ADL tasks. Pt required cues for safety and sequencing during ADL mobility. OT will continue to follow acutely  Follow Up Recommendations  SNF;Supervision/Assistance - 24 hour    Equipment Recommendations  Other (comment)(TBD at next venue of care)    Recommendations for Other Services      Precautions / Restrictions Precautions Precautions: Fall Restrictions Weight Bearing Restrictions: No       Mobility Bed Mobility Overal bed mobility: Needs Assistance Bed Mobility: Supine to Sit Rolling: Min guard   Supine to sit: Min assist;HOB elevated     General bed mobility comments: MIn A to advance LE and cues for use of bedrail, provided light HHA for trunk stability/advancement  Transfers                      Balance Overall balance assessment: Needs assistance Sitting-balance support: Feet supported;Bilateral upper extremity supported;Single extremity supported Sitting balance-Leahy Scale: Fair                                     ADL either performed or assessed with clinical judgement   ADL Overall ADL's : Needs assistance/impaired     Grooming: Wash/dry hands;Wash/dry face;Set up;Supervision/safety;Sitting   Upper Body Bathing: Set up;Supervision/ safety;Sitting   Lower Body  Bathing: Moderate assistance;Minimal assistance;Sitting/lateral leans;Sit to/from stand   Upper Body Dressing : Min guard;Sitting       Toilet Transfer: Moderate assistance;Minimal assistance;Stand-pivot;RW;Cueing for safety;Cueing for sequencing   Toileting- Clothing Manipulation and Hygiene: Moderate assistance;Sit to/from stand               Vision Baseline Vision/History: Wears glasses Wears Glasses: Reading only Patient Visual Report: No change from baseline     Perception     Praxis      Cognition Arousal/Alertness: Awake/alert Behavior During Therapy: WFL for tasks assessed/performed Overall Cognitive Status: Impaired/Different from baseline Area of Impairment: Orientation;Memory                       Following Commands: Follows one step commands with increased time;Follows one step commands consistently     Problem Solving: Slow processing;Decreased initiation;Requires verbal cues General Comments: Increased time and effort to mobilize, requires repeated verbal and tactile cuing at times for mobility        Exercises     Shoulder Instructions       General Comments      Pertinent Vitals/ Pain       Pain Assessment: Faces Faces Pain Scale: Hurts a little bit Pain Location: R side of abdomen with mobility Pain Descriptors / Indicators: Sore Pain Intervention(s): Monitored during session;Repositioned  Home Living  Prior Functioning/Environment              Frequency  Min 2X/week        Progress Toward Goals  OT Goals(current goals can now be found in the care plan section)  Progress towards OT goals: Progressing toward goals     Plan Discharge plan remains appropriate    Co-evaluation                 AM-PAC OT "6 Clicks" Daily Activity     Outcome Measure   Help from another person eating meals?: None Help from another person taking care of personal  grooming?: A Little Help from another person toileting, which includes using toliet, bedpan, or urinal?: A Lot Help from another person bathing (including washing, rinsing, drying)?: A Lot Help from another person to put on and taking off regular upper body clothing?: A Little Help from another person to put on and taking off regular lower body clothing?: A Lot 6 Click Score: 16    End of Session Equipment Utilized During Treatment: Gait belt;Other (comment);Rolling walker(BSC)  OT Visit Diagnosis: Unsteadiness on feet (R26.81);Other abnormalities of gait and mobility (R26.89);Muscle weakness (generalized) (M62.81);Ataxia, unspecified (R27.0);Other symptoms and signs involving cognitive function;Pain Pain - Right/Left: Right Pain - part of body: (abdomen)   Activity Tolerance Patient tolerated treatment well   Patient Left in bed;with bed alarm set;with call bell/phone within reach   Nurse Communication          Time: [id number] OT Time Calculation (min): 26 min  Charges: OT General Charges $OT Visit: 1 Visit OT Treatments $Self Care/Home Management : 8-22 mins $Therapeutic Activity: 8-22 mins     Britt Bottom 05/16/2019, 2:42 PM

## 2019-05-16 NOTE — Progress Notes (Signed)
Patient coughed up a blood clot this AM. Patient stable. Will continue to monitor.

## 2019-05-17 DIAGNOSIS — R9431 Abnormal electrocardiogram [ECG] [EKG]: Secondary | ICD-10-CM

## 2019-05-17 LAB — APTT
aPTT: 66 seconds — ABNORMAL HIGH (ref 24–36)
aPTT: 68 seconds — ABNORMAL HIGH (ref 24–36)
aPTT: 48 seconds — ABNORMAL HIGH (ref 24–36)

## 2019-05-17 LAB — COMPREHENSIVE METABOLIC PANEL
ALT: 36 U/L (ref 0–44)
AST: 72 U/L — ABNORMAL HIGH (ref 15–41)
Albumin: 1.8 g/dL — ABNORMAL LOW (ref 3.5–5.0)
Alkaline Phosphatase: 247 U/L — ABNORMAL HIGH (ref 38–126)
Anion gap: 10 (ref 5–15)
BUN: 31 mg/dL — ABNORMAL HIGH (ref 8–23)
CO2: 19 mmol/L — ABNORMAL LOW (ref 22–32)
Calcium: 8 mg/dL — ABNORMAL LOW (ref 8.9–10.3)
Chloride: 107 mmol/L (ref 98–111)
Creatinine, Ser: 2.85 mg/dL — ABNORMAL HIGH (ref 0.61–1.24)
GFR calc Af Amer: 25 mL/min — ABNORMAL LOW (ref >60)
GFR calc non Af Amer: 21 mL/min — ABNORMAL LOW (ref >60)
Glucose, Bld: 90 mg/dL (ref 70–99)
Potassium: 3.9 mmol/L (ref 3.5–5.1)
Sodium: 136 mmol/L (ref 135–145)
Total Bilirubin: 0.9 mg/dL (ref 0.3–1.2)
Total Protein: 6.7 g/dL (ref 6.5–8.1)

## 2019-05-17 LAB — CBC
HCT: 22.7 % — ABNORMAL LOW (ref 39.0–52.0)
Hemoglobin: 7.2 g/dL — ABNORMAL LOW (ref 13.0–17.0)
MCH: 28.5 pg (ref 26.0–34.0)
MCHC: 31.7 g/dL (ref 30.0–36.0)
MCV: 89.7 fL (ref 80.0–100.0)
Platelets: 358 10*3/uL (ref 150–400)
RBC: 2.53 MIL/uL — ABNORMAL LOW (ref 4.22–5.81)
RDW: 18.5 % — ABNORMAL HIGH (ref 11.5–15.5)
WBC: 11.3 10*3/uL — ABNORMAL HIGH (ref 4.0–10.5)
nRBC: 0 % (ref 0.0–0.2)

## 2019-05-17 LAB — HEMOGLOBIN AND HEMATOCRIT, BLOOD
HCT: 22.6 % — ABNORMAL LOW (ref 39.0–52.0)
Hemoglobin: 7.1 g/dL — ABNORMAL LOW (ref 13.0–17.0)

## 2019-05-17 LAB — PHOSPHORUS: Phosphorus: 4.3 mg/dL (ref 2.5–4.6)

## 2019-05-17 LAB — HEPARIN LEVEL (UNFRACTIONATED): Heparin Unfractionated: >2.2 IU/mL — ABNORMAL HIGH (ref 0.30–0.70)

## 2019-05-17 LAB — OCCULT BLOOD X 1 CARD TO LAB, STOOL: Fecal Occult Bld: NEGATIVE

## 2019-05-17 MED ORDER — HEPARIN (PORCINE) 25000 UT/250ML-% IV SOLN
900.0000 [IU]/h | INTRAVENOUS | Status: DC
  Administered 2019-05-17: 200 [IU]/h via INTRAVENOUS
  Filled 2019-05-17: qty 250

## 2019-05-17 NOTE — Progress Notes (Signed)
PROGRESS NOTE    Jeremy Sherman  [id number] DOB: October 13, 1950 DOA: 05/07/2019 PCP: Jeremy Riches, NP   Brief Narrative:  HPI On 05/08/2019 by Dr. Shela Leff Jabriel Sherman is a 69 y.o. male with medical history significant of CAD status post CABG, hypertension, hyperlipidemia, hypothyroidism, prediabetes, recent hospital admission for stroke, carotid artery disease status post stent on Eliquis and Brilinta, left atrial appendage thrombus presenting to the ED for evaluation of abnormal labs.  Patient was called by his physician after outpatient labs revealed worsening renal failure.  Patient appears confused and is not sure why he is here.  He has no complaints.  Denies fevers, chills, chest pain, cough, shortness of breath, nausea, vomiting, abdominal pain, diarrhea, flank pain, or dysuria.  No family available at this time.  Per ED provider's conversation with the patient's wife since his discharge from rehab 2 weeks ago he has had slowly worsening confusion.  He has an indwelling Foley catheter since his prior hospitalization which has been draining urine.  Interim history Patient found to have acute kidney injury and nephrology consulted and following.  Also developed fever with worsening leukocytosis, work-up thus far has been unremarkable.  There was initial concern of CAUTI when he was first admitted however repeat urine cultures show no growth. Assessment & Plan   Acute kidney injury -Creatinine on admission 4.72 (baseline approximately 1) -Today creatinine down to 2.85 -Nephrology consulted and appreciated- discussed with Dr. Hollie Salk, planning for renal biopsy- IR consulted- likely on 05/19/2019 -Renal ultrasound unremarkable -ANCA negative -Patient was placed on IV fluids -Continue to monitor BMP  M spike -patient noted to have M spike and pending renal biopsy -will need to discuss with oncology  Complicated UTI/pyelonephritis/pyuria with hematuria in  a patient with chronic indwelling Foley catheter due to urinary retention and BPH -Urine culture initially unremarkable.  Repeat urine culture on 05/13/2019 show no growth  -Foley catheter was changed on 05/13/2019 -Blood cultures show no growth to date -patient was placed on IV ceftriaxone   Acute metabolic encephalopathy -Suspect some underlying cognitive impairment -Multifactorial including dehydration, AKI, hyperammonemia, low B12, tramadol use -Appears to be improving, although unsure patient's baseline  Normocytic anemia -Certainly iron deficiency anemia versus anemia of chronic disease -Hemoglobin currently 7.2, will repeat H/H -Iron saturation 9, B12 142 -Was given B12 supplementation along with IV iron and ESA per nephrology -FOBT reordered, not sure why my orders are being canceled  -Of note, patient on Brilinta and Eliquis- will hold for renal biopsy and place on heparin  History of CVA -Left CAS/left MCA stenosis status post left CEA and left MCA stent in January 2021.  On Brilinta.  Patient also noted to have a left atrial appendage thrombus was placed on Eliquis. -Currently no focal neuro deficits other than lower extremity bilateral weakness.  Patient was discharged home from rehab on 04/24/2019. -Continue Brilinta, Eliquis, statin- as above- hold Brilinta and Eliquis  -PT, OT recommending SNF  History of coronary artery disease -Patient with CABG in 2019 -Currently stable, no complaints of chest pain  Essential hypertension -Currently on no meds, will add on meds as needed  Hypothyroidism -Continue Synthroid -TSH 6.5- however this was a hemolyzed sample  Metabolic acidosis  -continue sodium bicarb  Hyponatremia -Resolved  Hyperphosphatemia -Likely due to renal failure -Treatment per nephrology  Generalized weakness with debility -PT and OT as above  Chronic pain -Continue scheduled Tylenol, avoid sedating medications   GERD -Continue PPI  DVT  Prophylaxis Eliquis-->  Heparin  Code Status: Full  Family Communication: None at bedside  Disposition Plan: Admitted from home for acute metabolic encephalopathy along with acute kidney injury.  Pending renal biopsy on 05/19/2019.  Suspect disposition to SNF when stable.   Consultants Nephrology Interventional radiology  Procedures  Renal ultrasound  Antibiotics   Anti-infectives (From admission, onward)   Start     Dose/Rate Route Frequency Ordered Stop   05/13/19 1045  cefTRIAXone (ROCEPHIN) 1 g in sodium chloride 0.9 % 100 mL IVPB     1 g 200 mL/hr over 30 Minutes Intravenous Every 24 hours 05/13/19 1036     05/08/19 0130  cefTRIAXone (ROCEPHIN) 1 g in sodium chloride 0.9 % 100 mL IVPB  Status:  Discontinued     1 g 200 mL/hr over 30 Minutes Intravenous Daily at bedtime 05/08/19 0100 05/09/19 [id number]      Subjective:   Jeremy Sherman seen and examined today.  No complaints today. Just grunting. Denies chest pain or shortness of breath, dizziness, headache, N/V/D/C.    Objective:   Vitals:   05/16/19 0906 05/16/19 1806 05/16/19 2132 05/17/19 0441  BP: (!) 133/92 137/87 132/69 128/70  Pulse: 75 94 75 70  Resp: 20 18 18 18   Temp: 97.8 F (36.6 C) 98.5 F (36.9 C) 98.3 F (36.8 C) 98.5 F (36.9 C)  TempSrc: Oral Oral Oral Oral  SpO2: 100% 100% 98% 97%  Weight:      Height:        Intake/Output Summary (Last 24 hours) at 05/17/2019 [id number] Last data filed at 05/17/2019 [id number] Gross per 24 hour  Intake 125.39 ml  Output 700 ml  Net -574.61 ml   Filed Weights   05/10/19 2113 05/11/19 2031 05/13/19 2059  Weight: 98 kg 97.6 kg 97.9 kg   Exam  General: Well developed, chronically ill appearing, NAD  HEENT: NCAT, mucous membranes moist.   Cardiovascular: S1 S2 auscultated, RRR  Respiratory: Clear to auscultation bilaterally  Abdomen: Soft, nontender, nondistended, + bowel sounds  Extremities: warm dry without cyanosis clubbing or edema  Neuro: AAOx2 (self,  place), nonfocal   Psych: Appropriate mood and affect, pleasantly confused  Data Reviewed: I have personally reviewed following labs and imaging studies  CBC: Recent Labs  Lab 05/13/19 0328 05/13/19 0328 05/15/19 0320 05/15/19 1427 05/16/19 0347 05/16/19 1959 05/17/19 0122  WBC 14.3*  --  14.0*  --  13.8* 10.8* 11.3*  HGB 8.4*   < > 7.6* 7.7* 8.0* 7.7* 7.2*  HCT 26.8*   < > 24.8* 24.5* 25.5* 23.9* 22.7*  MCV 93.4  --  91.5  --  90.4 87.5 89.7  PLT 375  --  382  --  370 352 358   < > = values in this interval not displayed.   Basic Metabolic Panel: Recent Labs  Lab 05/11/19 0405 05/11/19 0405 05/12/19 0302 05/12/19 0302 05/13/19 0328 05/14/19 0247 05/15/19 0320 05/16/19 0347 05/17/19 0122  NA 137   < > 141   < > 137 137 136 137 136  K 3.7   < > 3.8   < > 4.1 3.9 4.1 4.0 3.9  CL 109   < > 109   < > 107 107 107 106 107  CO2 18*   < > 21*   < > 18* 20* 18* 17* 19*  GLUCOSE 95   < > 102*   < > 93 95 113* 85 90  BUN 41*   < > 40*   < >  36* 33* 32* 31* 31*  CREATININE 3.71*   < > 3.39*   < > 2.92* 2.79* 2.70* 2.80* 2.85*  CALCIUM 7.9*   < > 7.9*   < > 8.1* 8.1* 7.7* 8.0* 8.0*  MG 2.5*  --  2.4  --  2.4  --   --   --   --   PHOS 4.4   < > 4.3   < > 3.8 4.0 4.1 3.7 4.3   < > = values in this interval not displayed.   GFR: Estimated Creatinine Clearance: 30.4 mL/min (A) (by C-G formula based on SCr of 2.85 mg/dL (H)). Liver Function Tests: Recent Labs  Lab 05/13/19 0328 05/14/19 0247 05/15/19 0320 05/16/19 0347 05/17/19 0122  AST  --   --   --   --  72*  ALT  --   --   --   --  36  ALKPHOS  --   --   --   --  247*  BILITOT  --   --   --   --  0.9  PROT  --   --   --   --  6.7  ALBUMIN 2.1* 1.9* 1.9* 1.9* 1.8*   No results for input(s): LIPASE, AMYLASE in the last 168 hours. No results for input(s): AMMONIA in the last 168 hours. Coagulation Profile: No results for input(s): INR, PROTIME in the last 168 hours. Cardiac Enzymes: No results for input(s):  CKTOTAL, CKMB, CKMBINDEX, TROPONINI in the last 168 hours. BNP (last 3 results) No results for input(s): PROBNP in the last 8760 hours. HbA1C: No results for input(s): HGBA1C in the last 72 hours. CBG: No results for input(s): GLUCAP in the last 168 hours. Lipid Profile: No results for input(s): CHOL, HDL, LDLCALC, TRIG, CHOLHDL, LDLDIRECT in the last 72 hours. Thyroid Function Tests: No results for input(s): TSH, T4TOTAL, FREET4, T3FREE, THYROIDAB in the last 72 hours. Anemia Panel: No results for input(s): VITAMINB12, FOLATE, FERRITIN, TIBC, IRON, RETICCTPCT in the last 72 hours. Urine analysis:    Component Value Date/Time   COLORURINE YELLOW 05/13/2019 0954   APPEARANCEUR HAZY (A) 05/13/2019 0954   LABSPEC 1.016 05/13/2019 0954   PHURINE 6.0 05/13/2019 0954   GLUCOSEU NEGATIVE 05/13/2019 0954   HGBUR LARGE (A) 05/13/2019 0954   BILIRUBINUR NEGATIVE 05/13/2019 0954   [place] 05/13/2019 0954   PROTEINUR 100 (A) 05/13/2019 0954   NITRITE NEGATIVE 05/13/2019 0954   LEUKOCYTESUR LARGE (A) 05/13/2019 0954   Sepsis Labs: @LABRCNTIP (procalcitonin:4,lacticidven:4)  ) Recent Results (from the past 240 hour(s))  SARS CORONAVIRUS 2 (TAT 6-24 HRS) Nasopharyngeal Nasopharyngeal Swab     Status: None   Collection Time: 05/07/19  8:48 PM   Specimen: Nasopharyngeal Swab  Result Value Ref Range Status   SARS Coronavirus 2 NEGATIVE NEGATIVE Final    Comment: (NOTE) SARS-CoV-2 target nucleic acids are NOT DETECTED. The SARS-CoV-2 RNA is generally detectable in upper and lower respiratory specimens during the acute phase of infection. Negative results do not preclude SARS-CoV-2 infection, do not rule out co-infections with other pathogens, and should not be used as the sole basis for treatment or other patient management decisions. Negative results must be combined with clinical observations, patient history, and epidemiological information. The expected result is  Negative. Fact Sheet for Patients: SugarRoll.be Fact Sheet for Healthcare Providers: https://www.woods-mathews.com/ This test is not yet approved or cleared by the [place] FDA and  has been authorized for detection and/or diagnosis of SARS-CoV-2 by FDA under  an Emergency Use Authorization (EUA). This EUA will remain  in effect (meaning this test can be used) for the duration of the COVID-19 declaration under Section 56 4(b)(1) of the Act, 21 U.S.C. section 360bbb-3(b)(1), unless the authorization is terminated or revoked sooner. Performed at [facility], [place] [address] [place], [place] [address]   Urine culture     Status: None   Collection Time: 05/07/19 11:45 PM   Specimen: Urine, Random  Result Value Ref Range Status   Specimen Description URINE, RANDOM  Final   Special Requests NONE  Final   Culture   Final    NO GROWTH Performed at [facility], [place] [address], [place], [place] [address]    Report Status 05/08/2019 FINAL  Final  Culture, blood (routine x 2)     Status: None   Collection Time: 05/08/19  1:20 AM   Specimen: BLOOD  Result Value Ref Range Status   Specimen Description BLOOD RIGHT ARM  Final   Special Requests   Final    BOTTLES DRAWN AEROBIC AND ANAEROBIC Blood Culture adequate volume   Culture   Final    NO GROWTH 5 DAYS Performed at [facility], [place] [address] [place], [place] [address]    Report Status 05/13/2019 FINAL  Final  Culture, blood (routine x 2)     Status: None   Collection Time: 05/08/19  1:20 AM   Specimen: BLOOD  Result Value Ref Range Status   Specimen Description BLOOD LEFT ARM  Final   Special Requests   Final    BOTTLES DRAWN AEROBIC AND ANAEROBIC Blood Culture adequate volume   Culture   Final    NO GROWTH 5 DAYS Performed at [facility], [place] [address] [place], [place] [address]    Report Status 05/13/2019 FINAL  Final  Culture, blood (routine x 2)      Status: None (Preliminary result)   Collection Time: 05/13/19 10:04 AM   Specimen: BLOOD  Result Value Ref Range Status   Specimen Description BLOOD RIGHT ANTECUBITAL  Final   Special Requests   Final    AEROBIC BOTTLE ONLY Blood Culture results may not be optimal due to an inadequate volume of blood received in culture bottles   Culture   Final    NO GROWTH 4 DAYS Performed at [facility], [place] [address], [place], [place] [address]    Report Status PENDING  Incomplete  Culture, blood (routine x 2)     Status: None (Preliminary result)   Collection Time: 05/13/19 10:30 AM   Specimen: BLOOD RIGHT ARM  Result Value Ref Range Status   Specimen Description BLOOD RIGHT ARM  Final   Special Requests   Final    AEROBIC BOTTLE ONLY Blood Culture results may not be optimal due to an inadequate volume of blood received in culture bottles   Culture   Final    NO GROWTH 4 DAYS Performed at [facility], [place] [address] [place], [place] [address]    Report Status PENDING  Incomplete  Culture, Urine     Status: None   Collection Time: 05/13/19  6:58 PM   Specimen: Urine, Catheterized  Result Value Ref Range Status   Specimen Description URINE, CATHETERIZED  Final   Special Requests NONE  Final   Culture   Final    NO GROWTH Performed at [facility], [address] [place], [place] [address]  Report Status 05/14/2019 FINAL  Final      Radiology Studies: No results found.   Scheduled Meds: . atorvastatin  80 mg Oral q1800  . Chlorhexidine Gluconate Cloth  6 each Topical Daily  . feeding supplement  1 Container Oral TID BM  . feeding supplement (PRO-STAT SUGAR FREE 64)  30 mL Oral TID WC  . influenza vaccine adjuvanted  0.5 mL Intramuscular Tomorrow-1000  . levothyroxine  100 mcg Oral Q0600  . lidocaine  1 application Urethral Once  . pneumococcal 23 valent vaccine  0.5 mL Intramuscular Tomorrow-1000  . sodium bicarbonate  650 mg Oral TID  . vitamin B-12   1,000 mcg Oral Daily   Continuous Infusions: . cefTRIAXone (ROCEPHIN)  IV 1 g (05/16/19 1021)  . heparin 200 Units/hr (05/17/19 0345)     LOS: 9 days   Time Spent in minutes   30 minutes  Lanora Reveron D.O. on 05/17/2019 at 8:22 AM  Between 7am to 7pm - Please see pager noted on [email or web address]  After 7pm go to [email or web address]  And look for the night coverage person covering for me after hours  [facility] Office  [phone]

## 2019-05-17 NOTE — Plan of Care (Signed)
  Problem: Activity: Goal: Activity intolerance will improve Outcome: Progressing   

## 2019-05-17 NOTE — Progress Notes (Signed)
ANTICOAGULATION CONSULT NOTE - Follow Up Consult  Pharmacy Consult for Apixaban to Heparin Indication: LA thrombus  No Known Allergies  Patient Measurements: Height: 6\' 5"  (195.6 cm) Weight: 215 lb 13.3 oz (97.9 kg) IBW/kg (Calculated) : 89.1  Heparin Dosing Weight:  97.9 kg  Vital Signs: Temp: 98.5 F (36.9 C) (03/20 0441) Temp Source: Oral (03/20 0441) BP: 128/70 (03/20 0441) Pulse Rate: 70 (03/20 0441)  Labs: Recent Labs    05/15/19 0320 05/15/19 0320 05/15/19 1427 05/15/19 1902 05/15/19 1902 05/15/19 2139 05/16/19 0347 05/16/19 0347 05/16/19 0828 05/16/19 1249 05/16/19 1959 05/17/19 0122  HGB 7.6*   < >   < >  --   --   --  8.0*   < >  --   --  7.7* 7.2*  HCT 24.8*   < >   < >  --   --   --  25.5*  --   --   --  23.9* 22.7*  PLT 382   < >  --   --   --   --  370  --   --   --  352 358  APTT  --   --   --  >200*   < > >200*  --   --    < > 182* 79* 66*  HEPARINUNFRC  --   --   --  >2.20*  --  >2.20*  --   --   --   --   --  >2.20*  CREATININE 2.70*  --   --   --   --   --  2.80*  --   --   --   --  2.85*   < > = values in this interval not displayed.    Estimated Creatinine Clearance: 30.4 mL/min (A) (by C-G formula based on SCr of 2.85 mg/dL (H)).  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  Now to begin heparin while Eliquis on hold in anticipation of renal biopsy.  Last dose of Eliquis was on 3/17 PM.   aPTT this afternoon is therapeutic after restarting at a low rate. It is plausible that both the aPTT and HL are falsely elevated given holding on to Apixaban with AKI. Hgb 7.2 << 7.7, plts wnl.   Heparin infusing in right upper forearm and labs being drawn in the R-hand. The RN stated that she was not asked to pause the drip. Will increase slightly and ask for the drip to be paused with this evening's recheck to avoid falsely elevated labs. No bleeding or issues noted  per RN.   Goal of Therapy:  aPTT 66-102 seconds Monitor platelets by anticoagulation protocol: Yes  Heparin level 0.3-0.7 units/hr   Plan:  Increase Heparin to 300 units/hr (3 ml/hr) Will continue to monitor for any signs/symptoms of bleeding and will follow up with heparin level in 6 hours   Thank you for allowing pharmacy to be a part of this patient's care.  Alycia Rossetti, PharmD, BCPS Clinical Pharmacist Clinical phone for 05/17/2019: [phone] 05/17/2019 9:12 AM   **Pharmacist phone directory can now be found on [email or web address] ([email or web address]).  Listed under [place].

## 2019-05-17 NOTE — Progress Notes (Signed)
[facility] Progress Note    Assessment/ Plan:   Pt is a 69 y.o. yo male with HTN, CAD who was admitted on 05/07/2019 with AKI (crt 1.08 on 04/21/19)  Assessment/Plan: 1. Renal-  True AKI from normal crt on 04/21/19.  Left hospital with indwelling foley-  Has stayed in place and changed out per home health on Monday.  Many new meds per cardiology including brilinta and a statin.  U/A showing 100 of prot, white and red blood cells- possible UTI but no growth on culture.  Renal u/s normal with 12 cm kidneys.  Is non oliguric. BP has not been low.  CK 78.  ANA negative, complements WNLand ANCA negative. Protein to crt ratio low. Right now non oliguric and crt trending better.  Will do SPEP/ free light chains--> M-spike 1.0. has not had + urine culture in 2021.  Since Cr hasn't improved as expected and SPEP resulted with M-spike- think we need renal biopsy.  Have ordered, appreciate IR assistance, for Monday 2. + M-spike- would consider heme c/s too 3.  Fever/ vomiting: UA/ culture redrawn, blood cultures done, restarted ceftriaxone. AXR--> negative  4. HTN/volume-  Appeared dry if anything-  Had not been eating well-  Was getting IVF -  Now BP is up have stopped 5 Anemia-  Significant-  Low iron stores-  First feraheme dose 3/12, s/p 3/15. Recheck pending  6 Metabolic acidosis - on oral bicarb - improving some 7.  Dispo: Since Cr has stalled out and M-spike resulted 1.0, would like to keep to complete workup  Subjective:    Tearful today-- thinking about his relatives who have passed away/  Objective:   BP 138/62 (BP Location: Right Arm)   Pulse 75   Temp 98.4 F (36.9 C) (Oral)   Resp 20   Ht 6\' 5"  (1.956 m)   Wt 97.9 kg   SpO2 99%   BMI 25.59 kg/m   Intake/Output Summary (Last 24 hours) at 05/17/2019 1058 Last data filed at 05/17/2019 0909 Gross per 24 hour  Intake 205.39 ml  Output 700 ml  Net -494.61 ml   Weight change:   Physical Exam: Gen: sitting in bed,  NAD CVS: RRR Resp: clear Abd: soft, nontender Ext:no LE edema  Imaging: No results found.  Labs: BMET Recent Labs  Lab 05/11/19 0405 05/12/19 0302 05/13/19 0328 05/14/19 0247 05/15/19 0320 05/16/19 0347 05/17/19 0122  NA 137 141 137 137 136 137 136  K 3.7 3.8 4.1 3.9 4.1 4.0 3.9  CL 109 109 107 107 107 106 107  CO2 18* 21* 18* 20* 18* 17* 19*  GLUCOSE 95 102* 93 95 113* 85 90  BUN 41* 40* 36* 33* 32* 31* 31*  CREATININE 3.71* 3.39* 2.92* 2.79* 2.70* 2.80* 2.85*  CALCIUM 7.9* 7.9* 8.1* 8.1* 7.7* 8.0* 8.0*  PHOS 4.4 4.3 3.8 4.0 4.1 3.7 4.3   CBC Recent Labs  Lab 05/15/19 0320 05/15/19 0320 05/15/19 1427 05/16/19 0347 05/16/19 1959 05/17/19 0122  WBC 14.0*  --   --  13.8* 10.8* 11.3*  HGB 7.6*   < > 7.7* 8.0* 7.7* 7.2*  HCT 24.8*   < > 24.5* 25.5* 23.9* 22.7*  MCV 91.5  --   --  90.4 87.5 89.7  PLT 382  --   --  370 352 358   < > = values in this interval not displayed.    Medications:    . atorvastatin  80 mg Oral q1800  . Chlorhexidine  Gluconate Cloth  6 each Topical Daily  . feeding supplement  1 Container Oral TID BM  . feeding supplement (PRO-STAT SUGAR FREE 64)  30 mL Oral TID WC  . influenza vaccine adjuvanted  0.5 mL Intramuscular Tomorrow-1000  . levothyroxine  100 mcg Oral Q0600  . lidocaine  1 application Urethral Once  . pneumococcal 23 valent vaccine  0.5 mL Intramuscular Tomorrow-1000  . sodium bicarbonate  650 mg Oral TID  . vitamin B-12  1,000 mcg Oral Daily      Madelon Lips, MD

## 2019-05-17 NOTE — Progress Notes (Signed)
ANTICOAGULATION CONSULT NOTE - Follow Up Consult  Pharmacy Consult for Apixaban to Heparin Indication: LA thrombus 03/20/19  No Known Allergies  Patient Measurements: Height: 6\' 5"  (195.6 cm) Weight: 215 lb 13.3 oz (97.9 kg) IBW/kg (Calculated) : 89.1  Heparin Dosing Weight:  98 kg  Vital Signs: Temp: 98.5 F (36.9 C) (03/20 1855) Temp Source: Oral (03/20 1855) BP: 146/81 (03/20 1855) Pulse Rate: 78 (03/20 1855)  Labs: Recent Labs    05/15/19 0320 05/15/19 0320 05/15/19 1427 05/15/19 1902 05/15/19 1902 05/15/19 2139 05/16/19 0347 05/16/19 0828 05/16/19 1959 05/16/19 1959 05/17/19 0122 05/17/19 1226 05/17/19 1953  HGB 7.6*   < >   < >  --   --   --  8.0*  --  7.7*   < > 7.2* 7.1*  --   HCT 24.8*   < >   < >  --   --   --  25.5*  --  23.9*  --  22.7* 22.6*  --   PLT 382   < >  --   --   --   --  370  --  352  --  358  --   --   APTT  --   --   --  >200*   < > >200*  --    < > 79*   < > 66* 68* 48*  HEPARINUNFRC  --   --   --  >2.20*  --  >2.20*  --   --   --   --  >2.20*  --   --   CREATININE 2.70*  --   --   --   --   --  2.80*  --   --   --  2.85*  --   --    < > = values in this interval not displayed.    Estimated Creatinine Clearance: 30.4 mL/min (A) (by C-G formula based on SCr of 2.85 mg/dL (H)).  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  Now to begin heparin while Eliquis on hold in anticipation of renal biopsy. Last dose of Eliquis was on 3/17 PM. APTT and HL on 3/18 were both undetectably high.   aPTT earlier this afternoon was therapeutic after restarting at very low rate. It is plausible that both the aPTT and HL are falsely elevated given holding on to Apixaban with AKI. Daytime RN was asked to pause heparin before 2000 APTT draw but per evening RN this was not done. No issues with infusion running at 31ml/hr per RN. APTT still trended down so likely was affected by  apixaban. Will increase rate closer to usual heparin dose given subtherapeutic level.   Goal of Therapy:  aPTT 66-102 seconds Monitor platelets by anticoagulation protocol: Yes  Heparin level 0.3-0.7 units/hr   Plan:  Increase Heparin to 900 units/hr   Follow aPTT until correlating with HL Monitor daily aPTT, HL, CBC/plt Monitor for signs/symptoms of bleeding    Benetta Spar, PharmD, BCPS, BCCP Clinical Pharmacist  Please check AMION for all [place] phone numbers After 10:00 PM, call [place]

## 2019-05-17 NOTE — Progress Notes (Signed)
ANTICOAGULATION CONSULT NOTE - Follow Up Consult  Pharmacy Consult for Apixaban to Heparin Indication: LA thrombus  No Known Allergies  Patient Measurements: Height: 6\' 5"  (195.6 cm) Weight: 215 lb 13.3 oz (97.9 kg) IBW/kg (Calculated) : 89.1  Heparin Dosing Weight:  97.9 kg  Vital Signs: Temp: 98.3 F (36.8 C) (03/19 2132) Temp Source: Oral (03/19 2132) BP: 132/69 (03/19 2132) Pulse Rate: 75 (03/19 2132)  Labs: Recent Labs    05/15/19 0320 05/15/19 0320 05/15/19 1427 05/15/19 1902 05/15/19 1902 05/15/19 2139 05/16/19 0347 05/16/19 0347 05/16/19 0828 05/16/19 1249 05/16/19 1959 05/17/19 0122  HGB 7.6*   < >   < >  --   --   --  8.0*   < >  --   --  7.7* 7.2*  HCT 24.8*   < >   < >  --   --   --  25.5*  --   --   --  23.9* 22.7*  PLT 382   < >  --   --   --   --  370  --   --   --  352 358  APTT  --   --   --  >200*   < > >200*  --   --    < > 182* 79* 66*  HEPARINUNFRC  --   --   --  >2.20*  --  >2.20*  --   --   --   --   --  >2.20*  CREATININE 2.70*  --   --   --   --   --  2.80*  --   --   --   --  2.85*   < > = values in this interval not displayed.    Estimated Creatinine Clearance: 30.4 mL/min (A) (by C-G formula based on SCr of 2.85 mg/dL (H)).  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  Now to begin heparin while Eliquis on hold in anticipation of renal biopsy.  Last dose of Eliquis was on 3/17 PM.   PTT down to 66 (low end of therapeutic) - heparin has been off since 1015. Likely patient with elevated baseline PTT before heparin started. Heparin level remains >2.2 due to Eliquis.  -Hgb 8 (low/stable)  Goal of Therapy:  aPTT 66-102 seconds Monitor platelets by anticoagulation protocol: Yes  Heparin level 0.3-0.7 units/hr   Plan:  -Restart heparin at very low rate of 200 units/hr -Will f/u 8hr PTT  Sherlon Handing, PharmD, BCPS Please see amion for complete  clinical pharmacist phone list 05/17/2019 3:22 AM

## 2019-05-18 ENCOUNTER — Inpatient Hospital Stay (HOSPITAL_COMMUNITY): Payer: Medicare Other

## 2019-05-18 LAB — CBC
HCT: 19.9 % — ABNORMAL LOW (ref 39.0–52.0)
Hemoglobin: 6.6 g/dL — CL (ref 13.0–17.0)
MCH: 28.7 pg (ref 26.0–34.0)
MCHC: 33.2 g/dL (ref 30.0–36.0)
MCV: 86.5 fL (ref 80.0–100.0)
Platelets: 333 10*3/uL (ref 150–400)
RBC: 2.3 MIL/uL — ABNORMAL LOW (ref 4.22–5.81)
RDW: 18.5 % — ABNORMAL HIGH (ref 11.5–15.5)
WBC: 9.9 10*3/uL (ref 4.0–10.5)
nRBC: 0 % (ref 0.0–0.2)

## 2019-05-18 LAB — RENAL FUNCTION PANEL
Albumin: 1.6 g/dL — ABNORMAL LOW (ref 3.5–5.0)
Anion gap: 10 (ref 5–15)
BUN: 36 mg/dL — ABNORMAL HIGH (ref 8–23)
CO2: 19 mmol/L — ABNORMAL LOW (ref 22–32)
Calcium: 7.8 mg/dL — ABNORMAL LOW (ref 8.9–10.3)
Chloride: 106 mmol/L (ref 98–111)
Creatinine, Ser: 2.67 mg/dL — ABNORMAL HIGH (ref 0.61–1.24)
GFR calc Af Amer: 27 mL/min — ABNORMAL LOW (ref >60)
GFR calc non Af Amer: 23 mL/min — ABNORMAL LOW (ref >60)
Glucose, Bld: 98 mg/dL (ref 70–99)
Phosphorus: 3.8 mg/dL (ref 2.5–4.6)
Potassium: 4 mmol/L (ref 3.5–5.1)
Sodium: 135 mmol/L (ref 135–145)

## 2019-05-18 LAB — CULTURE, BLOOD (ROUTINE X 2)
Culture: NO GROWTH
Culture: NO GROWTH

## 2019-05-18 LAB — APTT: aPTT: 133 seconds — ABNORMAL HIGH (ref 24–36)

## 2019-05-18 LAB — HEPARIN LEVEL (UNFRACTIONATED): Heparin Unfractionated: >2.2 IU/mL — ABNORMAL HIGH (ref 0.30–0.70)

## 2019-05-18 LAB — HEMOGLOBIN AND HEMATOCRIT, BLOOD
HCT: 25.3 % — ABNORMAL LOW (ref 39.0–52.0)
Hemoglobin: 8 g/dL — ABNORMAL LOW (ref 13.0–17.0)

## 2019-05-18 LAB — PREPARE RBC (CROSSMATCH)

## 2019-05-18 MED ORDER — IOHEXOL 9 MG/ML PO SOLN: 500.0000 mL | ORAL | Status: AC

## 2019-05-18 MED ORDER — SODIUM CHLORIDE 0.9% IV SOLUTION: Freq: Once | INTRAVENOUS | Status: DC

## 2019-05-18 MED ORDER — HEPARIN (PORCINE) 25000 UT/250ML-% IV SOLN
600.0000 [IU]/h | INTRAVENOUS | Status: AC
  Administered 2019-05-18: 600 [IU]/h via INTRAVENOUS
  Filled 2019-05-18: qty 250

## 2019-05-18 MED ORDER — HEPARIN (PORCINE) 25000 UT/250ML-% IV SOLN: 600.0000 [IU]/h | INTRAVENOUS | Status: DC

## 2019-05-18 MED ORDER — LORAZEPAM 2 MG/ML IJ SOLN: 0.5000 mg | Freq: Once | INTRAMUSCULAR | Status: DC

## 2019-05-18 NOTE — Progress Notes (Signed)
During rounds with MD Ree Kida, discussed that pt was scheduled to start heparin drip at 0800 but was currently on first bag of PRBCs and is scheduled to get a second bag of PRBCs. MD gave verbal order to hold heparin gtt until H&H lab came back after first PRBCs was completed.   Pharm was notified that the heparin gtt was to be held.   Paulla Fore, RN, BSN

## 2019-05-18 NOTE — Progress Notes (Signed)
Notified MD Mikhail that the CT was resulted. Asked if heparin gtt could be restarted. MD cleared for heparin gtt to be restarted and to DC second unit of PRBCs.    Paulla Fore, RN, BSN.

## 2019-05-18 NOTE — Progress Notes (Signed)
CRITICAL VALUE ALERT  Critical Value:  HGB 6.6  Date & Time Notied:  0323  05/18/2019  Provider Notified: Kennon Holter, also informed of Heparin gtt going at 9 at this time. No signs of active bleeding.   Orders Received/Actions taken: Orders to hold Heparin gtt until 8am and to transfuse 2 units of PRBC.

## 2019-05-18 NOTE — Progress Notes (Signed)
This RN and Multimedia programmer did a telephone consent for blood transfusion with pt's wife, Kayvan Laskey at this time.   Eleanora Neighbor, RN

## 2019-05-18 NOTE — Progress Notes (Signed)
PROGRESS NOTE    Jeremy Sherman  [id number] DOB: 07-09-1950 DOA: 05/07/2019 PCP: Imagene Riches, NP   Brief Narrative:  HPI On 05/08/2019 by Dr. Shela Leff Jeremy Sherman is a 69 y.o. male with medical history significant of CAD status post CABG, hypertension, hyperlipidemia, hypothyroidism, prediabetes, recent hospital admission for stroke, carotid artery disease status post stent on Eliquis and Brilinta, left atrial appendage thrombus presenting to the ED for evaluation of abnormal labs.  Patient was called by his physician after outpatient labs revealed worsening renal failure.  Patient appears confused and is not sure why he is here.  He has no complaints.  Denies fevers, chills, chest pain, cough, shortness of breath, nausea, vomiting, abdominal pain, diarrhea, flank pain, or dysuria.  No family available at this time.  Per ED provider's conversation with the patient's wife since his discharge from rehab 2 weeks ago he has had slowly worsening confusion.  He has an indwelling Foley catheter since his prior hospitalization which has been draining urine.  Interim history Patient found to have acute kidney injury and nephrology consulted and following.  Also developed fever with worsening leukocytosis, work-up thus far has been unremarkable.  There was initial concern of CAUTI when he was first admitted however repeat urine cultures show no growth. Assessment & Plan   Acute kidney injury -Creatinine on admission 4.72 (baseline approximately 1) -Today creatinine down to 2.67 -Nephrology consulted and appreciated- discussed with Dr. Hollie Salk, planning for renal biopsy- IR consulted- likely on 05/19/2019 -Renal ultrasound unremarkable -ANCA negative -Patient was placed on IV fluids -Continue to monitor BMP  M spike -patient noted to have M spike and pending renal biopsy -will need to discuss with oncology  Complicated UTI/pyelonephritis/pyuria with hematuria in  a patient with chronic indwelling Foley catheter due to urinary retention and BPH -Urine culture initially unremarkable.  Repeat urine culture on 05/13/2019 show no growth  -Foley catheter was changed on 05/13/2019 -Blood cultures show no growth to date -patient was placed on IV ceftriaxone   Acute metabolic encephalopathy -Suspect some underlying cognitive impairment -Multifactorial including dehydration, AKI, hyperammonemia, low B12, tramadol use -Appears to be improving, although unsure patient's baseline  Normocytic anemia -Certainly iron deficiency anemia versus anemia of chronic disease -Hemoglobin dropped to 6.6 overnight, was transfused 1 unit PRBC -Iron saturation 9, B12 142 -Was given B12 supplementation along with IV iron and ESA per nephrology -FOBT negative -Of note, patient on Brilinta and Eliquis- will hold for renal biopsy and placed on heparin-currently on hold given anemia -Will obtain CT abdomen pelvis to rule out retroperitoneal bleed  History of CVA -Left CAS/left MCA stenosis status post left CEA and left MCA stent in January 2021.  On Brilinta.  Patient also noted to have a left atrial appendage thrombus was placed on Eliquis. -Currently no focal neuro deficits other than lower extremity bilateral weakness.  Patient was discharged home from rehab on 04/24/2019. -Continue Brilinta, Eliquis, statin- as above- hold Brilinta and Eliquis  -PT, OT recommending SNF  History of coronary artery disease -Patient with CABG in 2019 -Currently stable, no complaints of chest pain  Essential hypertension -Currently on no meds, will add on meds as needed  Hypothyroidism -Continue Synthroid -TSH 6.5- however this was a hemolyzed sample  Metabolic acidosis  -continue sodium bicarb  Hyponatremia -Resolved  Hyperphosphatemia -Likely due to renal failure -Treatment per nephrology  Generalized weakness with debility -PT and OT as above  Chronic pain -Continue  scheduled Tylenol, avoid sedating medications  GERD -Continue PPI  DVT Prophylaxis Eliquis--> Heparin  Code Status: Full  Family Communication: None at bedside  Disposition Plan: Admitted from home for acute metabolic encephalopathy along with acute kidney injury.  Pending renal biopsy on 05/19/2019.  Now with anemia.  Suspect disposition to SNF when stable.   Consultants Nephrology Interventional radiology  Procedures  Renal ultrasound  Antibiotics   Anti-infectives (From admission, onward)   Start     Dose/Rate Route Frequency Ordered Stop   05/13/19 1045  cefTRIAXone (ROCEPHIN) 1 g in sodium chloride 0.9 % 100 mL IVPB  Status:  Discontinued     1 g 200 mL/hr over 30 Minutes Intravenous Every 24 hours 05/13/19 1036 05/18/19 1024   05/08/19 0130  cefTRIAXone (ROCEPHIN) 1 g in sodium chloride 0.9 % 100 mL IVPB  Status:  Discontinued     1 g 200 mL/hr over 30 Minutes Intravenous Daily at bedtime 05/08/19 0100 05/09/19 [id number]      Subjective:   Jeremy Sherman seen and examined today.  Patient upset this morning and wanting to be repositioned in bed.  Denies current chest pain or shortness of breath, abdominal pain, nausea or vomiting, dizziness or headache.  Objective:   Vitals:   05/17/19 2123 05/18/19 0441 05/18/19 0505 05/18/19 0857  BP: (!) 142/67 119/71 (!) 122/59 (!) 151/71  Pulse: 75 72 71 67  Resp: 20 20 18 18   Temp: 99.2 F (37.3 C) 98.4 F (36.9 C) 99.7 F (37.6 C) 98.6 F (37 C)  TempSrc: Oral Oral Oral Oral  SpO2: 100% 100% 97% 98%  Weight:      Height:        Intake/Output Summary (Last 24 hours) at 05/18/2019 1049 Last data filed at 05/18/2019 0826 Gross per 24 hour  Intake 1042 ml  Output 730 ml  Net 312 ml   Filed Weights   05/11/19 2031 05/13/19 2059 05/17/19 2119  Weight: 97.6 kg 97.9 kg 99.8 kg   Exam  General: Well developed, chronically ill-appearing, NAD  HEENT: NCAT, mucous membranes moist.   Cardiovascular: S1 S2 auscultated,  RRR  Respiratory: Clear to auscultation bilaterally  Abdomen: Soft, nontender, nondistended, + bowel sounds  Extremities: warm dry without cyanosis clubbing or edema  Neuro: AAOx2 (self, place) nonfocal  Psych: Tearful, anxious   Data Reviewed: I have personally reviewed following labs and imaging studies  CBC: Recent Labs  Lab 05/15/19 0320 05/15/19 1427 05/16/19 0347 05/16/19 0347 05/16/19 1959 05/17/19 0122 05/17/19 1226 05/18/19 0249 05/18/19 0909  WBC 14.0*  --  13.8*  --  10.8* 11.3*  --  9.9  --   HGB 7.6*   < > 8.0*   < > 7.7* 7.2* 7.1* 6.6* 8.0*  HCT 24.8*   < > 25.5*   < > 23.9* 22.7* 22.6* 19.9* 25.3*  MCV 91.5  --  90.4  --  87.5 89.7  --  86.5  --   PLT 382  --  370  --  352 358  --  333  --    < > = values in this interval not displayed.   Basic Metabolic Panel: Recent Labs  Lab 05/12/19 0302 05/12/19 0302 05/13/19 0328 05/13/19 0328 05/14/19 0247 05/15/19 0320 05/16/19 0347 05/17/19 0122 05/18/19 0249  NA 141   < > 137   < > 137 136 137 136 135  K 3.8   < > 4.1   < > 3.9 4.1 4.0 3.9 4.0  CL 109   < > 107   < >  107 107 106 107 106  CO2 21*   < > 18*   < > 20* 18* 17* 19* 19*  GLUCOSE 102*   < > 93   < > 95 113* 85 90 98  BUN 40*   < > 36*   < > 33* 32* 31* 31* 36*  CREATININE 3.39*   < > 2.92*   < > 2.79* 2.70* 2.80* 2.85* 2.67*  CALCIUM 7.9*   < > 8.1*   < > 8.1* 7.7* 8.0* 8.0* 7.8*  MG 2.4  --  2.4  --   --   --   --   --   --   PHOS 4.3   < > 3.8   < > 4.0 4.1 3.7 4.3 3.8   < > = values in this interval not displayed.   GFR: Estimated Creatinine Clearance: 32.4 mL/min (A) (by C-G formula based on SCr of 2.67 mg/dL (H)). Liver Function Tests: Recent Labs  Lab 05/14/19 0247 05/15/19 0320 05/16/19 0347 05/17/19 0122 05/18/19 0249  AST  --   --   --  72*  --   ALT  --   --   --  36  --   ALKPHOS  --   --   --  247*  --   BILITOT  --   --   --  0.9  --   PROT  --   --   --  6.7  --   ALBUMIN 1.9* 1.9* 1.9* 1.8* 1.6*   No results  for input(s): LIPASE, AMYLASE in the last 168 hours. No results for input(s): AMMONIA in the last 168 hours. Coagulation Profile: No results for input(s): INR, PROTIME in the last 168 hours. Cardiac Enzymes: No results for input(s): CKTOTAL, CKMB, CKMBINDEX, TROPONINI in the last 168 hours. BNP (last 3 results) No results for input(s): PROBNP in the last 8760 hours. HbA1C: No results for input(s): HGBA1C in the last 72 hours. CBG: No results for input(s): GLUCAP in the last 168 hours. Lipid Profile: No results for input(s): CHOL, HDL, LDLCALC, TRIG, CHOLHDL, LDLDIRECT in the last 72 hours. Thyroid Function Tests: No results for input(s): TSH, T4TOTAL, FREET4, T3FREE, THYROIDAB in the last 72 hours. Anemia Panel: No results for input(s): VITAMINB12, FOLATE, FERRITIN, TIBC, IRON, RETICCTPCT in the last 72 hours. Urine analysis:    Component Value Date/Time   COLORURINE YELLOW 05/13/2019 0954   APPEARANCEUR HAZY (A) 05/13/2019 0954   LABSPEC 1.016 05/13/2019 0954   PHURINE 6.0 05/13/2019 0954   GLUCOSEU NEGATIVE 05/13/2019 0954   HGBUR LARGE (A) 05/13/2019 0954   BILIRUBINUR NEGATIVE 05/13/2019 0954   [place] 05/13/2019 0954   PROTEINUR 100 (A) 05/13/2019 0954   NITRITE NEGATIVE 05/13/2019 0954   LEUKOCYTESUR LARGE (A) 05/13/2019 0954   Sepsis Labs: @LABRCNTIP (procalcitonin:4,lacticidven:4)  ) Recent Results (from the past 240 hour(s))  Culture, blood (routine x 2)     Status: None   Collection Time: 05/13/19 10:04 AM   Specimen: BLOOD  Result Value Ref Range Status   Specimen Description BLOOD RIGHT ANTECUBITAL  Final   Special Requests   Final    AEROBIC BOTTLE ONLY Blood Culture results may not be optimal due to an inadequate volume of blood received in culture bottles   Culture   Final    NO GROWTH 5 DAYS Performed at [facility], [place] [address], [place], [place] [address]    Report Status 05/18/2019 FINAL  Final  Culture, blood (routine  x 2)      Status: None   Collection Time: 05/13/19 10:30 AM   Specimen: BLOOD RIGHT ARM  Result Value Ref Range Status   Specimen Description BLOOD RIGHT ARM  Final   Special Requests   Final    AEROBIC BOTTLE ONLY Blood Culture results may not be optimal due to an inadequate volume of blood received in culture bottles   Culture   Final    NO GROWTH 5 DAYS Performed at [facility], [address] [place], [place] [address]    Report Status 05/18/2019 FINAL  Final  Culture, Urine     Status: None   Collection Time: 05/13/19  6:58 PM   Specimen: Urine, Catheterized  Result Value Ref Range Status   Specimen Description URINE, CATHETERIZED  Final   Special Requests NONE  Final   Culture   Final    NO GROWTH Performed at [facility], [address], [place], [place] [address]    Report Status 05/14/2019 FINAL  Final      Radiology Studies: No results found.   Scheduled Meds: . sodium chloride   Intravenous Once  . atorvastatin  80 mg Oral q1800  . Chlorhexidine Gluconate Cloth  6 each Topical Daily  . feeding supplement  1 Container Oral TID BM  . feeding supplement (PRO-STAT SUGAR FREE 64)  30 mL Oral TID WC  . influenza vaccine adjuvanted  0.5 mL Intramuscular Tomorrow-1000  . iohexol  500 mL Oral Q1H  . levothyroxine  100 mcg Oral Q0600  . lidocaine  1 application Urethral Once  . pneumococcal 23 valent vaccine  0.5 mL Intramuscular Tomorrow-1000  . sodium bicarbonate  650 mg Oral TID  . vitamin B-12  1,000 mcg Oral Daily   Continuous Infusions: . heparin Stopped (05/18/19 1036)     LOS: 10 days   Time Spent in minutes   30 minutes  Jenayah Antu D.O. on 05/18/2019 at 10:49 AM  Between 7am to 7pm - Please see pager noted on [email or web address]  After 7pm go to [email or web address]  And look for the night coverage person covering for me after hours  [facility] Office  [phone]

## 2019-05-18 NOTE — Progress Notes (Signed)
[facility] Progress Note    Assessment/ Plan:   Pt is a 69 y.o. yo male with HTN, CAD who was admitted on 05/07/2019 with AKI (crt 1.08 on 04/21/19)    1. AKI-  True AKI from normal crt on 04/21/19.  Left hospital with indwelling foley-  Has stayed in place and changed out per home health.  Many new meds per cardiology in setting of recent CVA including brilinta and a statin.  U/A showing 100 of prot, white and red blood cells- possible UTI but no growth on culture.  Renal u/s normal with 12 cm kidneys.  Is non oliguric. BP has not been low.  CK 78.  ANA negative, complements WNL and ANCA negative. Protein to crt ratio 0.3 g  Will do SPEP/ free light chains--> M-spike 1.0. has not had + urine culture in 2021.  Since Cr hasn't improved as expected and SPEP resulted with M-spike- think we need renal biopsy.  Have ordered, appreciate IR assistance, for Monday 3/22.  [facility] Nephropathology form in paper chart.   2. + M-spike- would consider heme c/s too 3.  Fever/ vomiting: UA/ culture redrawn, blood cultures done, restarted ceftriaxone. AXR--> negative  4. HTN/volume-  s/p IVFs 5 Anemia-  Received feraheme x 2 while here.  Hgb down to 6.6 today, recheck 8.0, has been on heparin gtt for procedures.  Consider CT for RP bleed, some mild back pain.  6 Metabolic acidosis - on oral bicarb - improving some 7.  Recent CVA: holding brilinta and Eliquis for procedures, on hep gtt 8.  Dispo: Pending biopsy  Subjective:    Sitting in chair.  Doing OK.    Objective:   BP (!) 151/71 (BP Location: Right Arm)   Pulse 67   Temp 98.6 F (37 C) (Oral)   Resp 18   Ht 6\' 5"  (1.956 m)   Wt 99.8 kg   SpO2 98%   BMI 26.09 kg/m   Intake/Output Summary (Last 24 hours) at 05/18/2019 1232 Last data filed at 05/18/2019 1000 Gross per 24 hour  Intake 1242 ml  Output 730 ml  Net 512 ml   Weight change:   Physical Exam: Gen: sitting in bed, NAD CVS: RRR Resp: clear Abd: soft, nontender Ext:no  LE edema  Imaging: No results found.  Labs: BMET Recent Labs  Lab 05/12/19 0302 05/13/19 0328 05/14/19 0247 05/15/19 0320 05/16/19 0347 05/17/19 0122 05/18/19 0249  NA 141 137 137 136 137 136 135  K 3.8 4.1 3.9 4.1 4.0 3.9 4.0  CL 109 107 107 107 106 107 106  CO2 21* 18* 20* 18* 17* 19* 19*  GLUCOSE 102* 93 95 113* 85 90 98  BUN 40* 36* 33* 32* 31* 31* 36*  CREATININE 3.39* 2.92* 2.79* 2.70* 2.80* 2.85* 2.67*  CALCIUM 7.9* 8.1* 8.1* 7.7* 8.0* 8.0* 7.8*  PHOS 4.3 3.8 4.0 4.1 3.7 4.3 3.8   CBC Recent Labs  Lab 05/16/19 0347 05/16/19 0347 05/16/19 1959 05/16/19 1959 05/17/19 0122 05/17/19 1226 05/18/19 0249 05/18/19 0909  WBC 13.8*  --  10.8*  --  11.3*  --  9.9  --   HGB 8.0*   < > 7.7*   < > 7.2* 7.1* 6.6* 8.0*  HCT 25.5*   < > 23.9*   < > 22.7* 22.6* 19.9* 25.3*  MCV 90.4  --  87.5  --  89.7  --  86.5  --   PLT 370  --  352  --  358  --  333  --    < > = values in this interval not displayed.    Medications:    . sodium chloride   Intravenous Once  . atorvastatin  80 mg Oral q1800  . Chlorhexidine Gluconate Cloth  6 each Topical Daily  . feeding supplement  1 Container Oral TID BM  . feeding supplement (PRO-STAT SUGAR FREE 64)  30 mL Oral TID WC  . influenza vaccine adjuvanted  0.5 mL Intramuscular Tomorrow-1000  . levothyroxine  100 mcg Oral Q0600  . lidocaine  1 application Urethral Once  . pneumococcal 23 valent vaccine  0.5 mL Intramuscular Tomorrow-1000  . sodium bicarbonate  650 mg Oral TID  . vitamin B-12  1,000 mcg Oral Daily      Madelon Lips, MD

## 2019-05-18 NOTE — Progress Notes (Signed)
ANTICOAGULATION CONSULT NOTE - Follow Up Consult  Pharmacy Consult for heparin Indication: LA thrombus  Labs: Recent Labs    05/15/19 1427 05/15/19 1902 05/15/19 1902 05/15/19 2139 05/16/19 0347 05/16/19 0828 05/16/19 1959 05/16/19 1959 05/17/19 0122 05/17/19 0122 05/17/19 1226 05/17/19 1953 05/18/19 0249  HGB   < >  --   --   --  8.0*  --  7.7*   < > 7.2*   < > 7.1*  --  6.6*  HCT   < >  --   --   --  25.5*  --  23.9*   < > 22.7*  --  22.6*  --  19.9*  PLT   < >  --   --   --  370  --  352  --  358  --   --   --  333  APTT  --  >200*   < > >200*  --    < > 79*   < > 66*   < > 68* 48* 133*  HEPARINUNFRC  --  >2.20*  --  >2.20*  --   --   --   --  >2.20*  --   --   --   --   CREATININE  --   --   --   --  2.80*  --   --   --  2.85*  --   --   --  2.67*   < > = values in this interval not displayed.    Assessment: 69yo male supratherapeutic on heparin after rate increase for dropping PTT despite increased rated; RN rec'd order from [facility] to hold heparin gtt until 8a given Hgb 6.6 (down from 7.1), no overt signs of bleeding but pt will be transfued.  Goal of Therapy:  aPTT 66-102 seconds   Plan:  At 0800 will resume heparin gtt at lower rate of 600 units/hr and check PTT in 8 hours.    Wynona Neat, PharmD, BCPS  05/18/2019,4:03 AM

## 2019-05-18 NOTE — Progress Notes (Signed)
ANTICOAGULATION CONSULT NOTE - Follow Up Consult  Pharmacy Consult for Apixaban to Heparin Indication: LA thrombus 03/20/19  No Known Allergies  Patient Measurements: Height: 6\' 5"  (195.6 cm) Weight: 220 lb 0.3 oz (99.8 kg) IBW/kg (Calculated) : 89.1  Heparin Dosing Weight:  98 kg  Vital Signs: Temp: 98.6 F (37 C) (03/21 0857) Temp Source: Oral (03/21 0857) BP: 151/71 (03/21 0857) Pulse Rate: 67 (03/21 0857)  Labs: Recent Labs     0000 05/15/19 2139 05/16/19 0347 05/16/19 0828 05/16/19 1959 05/16/19 1959 05/17/19 0122 05/17/19 0122 05/17/19 1226 05/17/19 1226 05/17/19 1953 05/18/19 0249 05/18/19 0909  HGB   < >  --  8.0*  --  7.7*   < > 7.2*   < > 7.1*   < >  --  6.6* 8.0*  HCT   < >  --  25.5*  --  23.9*   < > 22.7*   < > 22.6*  --   --  19.9* 25.3*  PLT   < >  --  370  --  352  --  358  --   --   --   --  333  --   APTT  --  >200*  --    < > 79*   < > 66*   < > 68*  --  48* 133*  --   HEPARINUNFRC  --  >2.20*  --   --   --   --  >2.20*  --   --   --   --  >2.20*  --   CREATININE  --   --  2.80*  --   --   --  2.85*  --   --   --   --  2.67*  --    < > = values in this interval not displayed.    Estimated Creatinine Clearance: 32.4 mL/min (A) (by C-G formula based on SCr of 2.67 mg/dL (H)).  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  Now to begin heparin while Eliquis on hold in anticipation of renal biopsy. Last dose of Eliquis was on 3/17 PM. APTT and HL on 3/18 were both undetectably high.   aPTT this AM was high again on 900 units/hr. Heparin was held for H/H drop with CT scan negative for abdominal bleed. Ok to restart heparin per MD with stop time tomorrow at [facility] for planned renal biopsy. Will still follow AM aPTT and HL to assist future dosing given unexpected levels to relatively low doses of heparin.    Goal of Therapy:  Heparin level 0.3-0.7 units/hr aPTT  66-102 seconds Monitor platelets by anticoagulation protocol: Yes     Plan:  Restart Heparin at 600 units/hr with stop 3/22 at 6am for renal biopsy Follow aPTT until correlating with HL Monitor daily aPTT, HL, CBC/plt Monitor for signs/symptoms of bleeding    Benetta Spar, PharmD, BCPS, BCCP Clinical Pharmacist  Please check AMION for all [place] phone numbers After 10:00 PM, call [place]

## 2019-05-19 ENCOUNTER — Inpatient Hospital Stay (HOSPITAL_COMMUNITY): Payer: Medicare Other

## 2019-05-19 LAB — CBC
HCT: 23.3 % — ABNORMAL LOW (ref 39.0–52.0)
Hemoglobin: 7.4 g/dL — ABNORMAL LOW (ref 13.0–17.0)
MCH: 28.6 pg (ref 26.0–34.0)
MCHC: 31.8 g/dL (ref 30.0–36.0)
MCV: 90 fL (ref 80.0–100.0)
Platelets: 312 10*3/uL (ref 150–400)
RBC: 2.59 MIL/uL — ABNORMAL LOW (ref 4.22–5.81)
RDW: 18.1 % — ABNORMAL HIGH (ref 11.5–15.5)
WBC: 8.6 10*3/uL (ref 4.0–10.5)
nRBC: 0 % (ref 0.0–0.2)

## 2019-05-19 LAB — RENAL FUNCTION PANEL
Albumin: 1.6 g/dL — ABNORMAL LOW (ref 3.5–5.0)
Anion gap: 11 (ref 5–15)
BUN: 34 mg/dL — ABNORMAL HIGH (ref 8–23)
CO2: 21 mmol/L — ABNORMAL LOW (ref 22–32)
Calcium: 8.2 mg/dL — ABNORMAL LOW (ref 8.9–10.3)
Chloride: 106 mmol/L (ref 98–111)
Creatinine, Ser: 2.45 mg/dL — ABNORMAL HIGH (ref 0.61–1.24)
GFR calc Af Amer: 30 mL/min — ABNORMAL LOW (ref >60)
GFR calc non Af Amer: 26 mL/min — ABNORMAL LOW (ref >60)
Glucose, Bld: 94 mg/dL (ref 70–99)
Phosphorus: 4.2 mg/dL (ref 2.5–4.6)
Potassium: 3.9 mmol/L (ref 3.5–5.1)
Sodium: 138 mmol/L (ref 135–145)

## 2019-05-19 LAB — PROTIME-INR
INR: 1.4 — ABNORMAL HIGH (ref 0.8–1.2)
Prothrombin Time: 17.1 seconds — ABNORMAL HIGH (ref 11.4–15.2)

## 2019-05-19 LAB — APTT: aPTT: 100 seconds — ABNORMAL HIGH (ref 24–36)

## 2019-05-19 LAB — HEPARIN LEVEL (UNFRACTIONATED): Heparin Unfractionated: 1.68 IU/mL — ABNORMAL HIGH (ref 0.30–0.70)

## 2019-05-19 MED ORDER — MIDAZOLAM HCL 2 MG/2ML IJ SOLN
INTRAMUSCULAR | Status: AC | PRN
  Administered 2019-05-19: 0.5 mg via INTRAVENOUS

## 2019-05-19 MED ORDER — FENTANYL CITRATE (PF) 100 MCG/2ML IJ SOLN
INTRAMUSCULAR | Status: AC
  Filled 2019-05-19: qty 2

## 2019-05-19 MED ORDER — HEPARIN (PORCINE) 25000 UT/250ML-% IV SOLN
600.0000 [IU]/h | INTRAVENOUS | Status: AC
  Administered 2019-05-19: 600 [IU]/h via INTRAVENOUS

## 2019-05-19 MED ORDER — FENTANYL CITRATE (PF) 100 MCG/2ML IJ SOLN
INTRAMUSCULAR | Status: AC | PRN
  Administered 2019-05-19: 25 ug via INTRAVENOUS

## 2019-05-19 MED ORDER — MIDAZOLAM HCL 2 MG/2ML IJ SOLN
INTRAMUSCULAR | Status: AC
  Filled 2019-05-19: qty 2

## 2019-05-19 NOTE — Progress Notes (Signed)
[facility] Progress Note    Assessment/ Plan:   Pt is a 69 y.o. yo male with HTN, CAD who was admitted on 05/07/2019 with AKI (crt 1.08 on 04/21/19)    1. AKI-  True AKI from normal crt on 04/21/19.  Left hospital with indwelling foley-  Has stayed in place and changed out per home health.  Many new meds per cardiology in setting of recent CVA including brilinta and a statin.  U/A showing 100 of prot, white and red blood cells- possible UTI but no growth on culture.  Renal u/s normal with 12 cm kidneys.  Is non oliguric. BP has not been low.  CK 78.  ANA negative, complements WNL and ANCA negative. Protein to crt ratio 0.3 g  Will do SPEP/ free light chains--> M-spike 1.0 and abnormal K/L FLC ratio. Has not had + urine culture in 2021.  Since Cr hasn't improved as expected and SPEP resulted with M-spike- think we need renal biopsy.  Have ordered, appreciate IR assistance, for Monday 3/22.  [facility] form in paper chart.   2. + M-spike- would consider heme c/s too  3.  Fever/ vomiting: UA/ culture redrawn, blood cultures done, restarted ceftriaxone. AXR--> negative  4. HTN/volume-  s/p IVFs, po intake for now. 5 Anemia-  Received feraheme x 2 while here.  Hb 7.4 this AM, has been on heparin gtt for procedures.  CT negative for RP bleed. 6 Metabolic acidosis - on oral bicarb - improving some 7.  Recent CVA: holding brilinta and Eliquis for procedures, on hep gtt 8.  Dispo: Pending biopsy - d/w RN, should be done this afternoon; would expect prelim results Wed most likely.   Subjective:    Pt c/o being hungry from NPO status o/w without complaints.   Objective:   BP (!) 146/73 (BP Location: Right Arm)   Pulse 93   Temp 98.3 F (36.8 C) (Oral)   Resp 18   Ht 6\' 5"  (1.956 m)   Wt 99.8 kg   SpO2 100%   BMI 26.09 kg/m   Intake/Output Summary (Last 24 hours) at 05/19/2019 1222 Last data filed at 05/19/2019 0543 Gross per 24 hour  Intake 390.46 ml  Output 900 ml   Net -509.54 ml   Weight change:   Physical Exam: Gen: sitting in bed, NAD CVS: RRR Resp: clear Abd: soft, nontender Ext:no LE edema  Imaging: CT ABDOMEN PELVIS WO CONTRAST  Result Date: 05/18/2019 CLINICAL DATA:  Anemia with mild back pain. EXAM: CT ABDOMEN AND PELVIS WITHOUT CONTRAST TECHNIQUE: Multidetector CT imaging of the abdomen and pelvis was performed following the standard protocol without IV contrast. COMPARISON:  None. FINDINGS: Lower chest: No acute abnormality. Hepatobiliary: No focal liver abnormality is seen. The gallbladder is moderately distended, without evidence of gallstones. Mild gallbladder wall thickening is seen. Very mild pericholecystic inflammation is suspected. Pancreas: Unremarkable. No pancreatic ductal dilatation or surrounding inflammatory changes. Spleen: Normal in size without focal abnormality. Adrenals/Urinary Tract: Adrenal glands are unremarkable. Kidneys are normal in size, without renal calculi or hydronephrosis. An ill-defined 1.1 cm cyst is seen within the anterior aspect of the mid right kidney. A 1.8 cm cyst is seen within the anteromedial aspect of the mid left kidney. Mild, bilateral nonspecific perinephric inflammatory fat stranding is noted. A Foley catheter is seen within a contracted urinary bladder. Mild diffuse urinary bladder wall thickening is seen. Stomach/Bowel: Stomach is within normal limits. The appendix is not clearly identified. No evidence of bowel  dilatation. Noninflamed diverticula are seen within the proximal sigmoid colon. Vascular/Lymphatic: There is mild aortic calcification. No enlarged abdominal or pelvic lymph nodes. Reproductive: Prostate is unremarkable. Other: No abdominal wall hernia or abnormality. No abdominopelvic ascites. Musculoskeletal: Degenerative changes seen throughout the lumbar spine. IMPRESSION: 1. Mild gallbladder wall thickening, without evidence of gallstones. Very mild pericholecystic inflammation is  suspected. If there is concern for acute cholecystitis, a right upper quadrant ultrasound could be performed for further evaluation. 2. Bilateral renal cysts with mild, bilateral nonspecific perinephric inflammatory fat stranding. Sequelae associated with acute pyelonephritis cannot be excluded. 3. Noninflamed sigmoid diverticulosis. Aortic Atherosclerosis (ICD10-I70.0). Electronically Signed   By: Virgina Norfolk M.D.   On: 05/18/2019 16:03    Labs: BMET Recent Labs  Lab 05/13/19 0328 05/14/19 0247 05/15/19 0320 05/16/19 0347 05/17/19 0122 05/18/19 0249 05/19/19 0542  NA 137 137 136 137 136 135 138  K 4.1 3.9 4.1 4.0 3.9 4.0 3.9  CL 107 107 107 106 107 106 106  CO2 18* 20* 18* 17* 19* 19* 21*  GLUCOSE 93 95 113* 85 90 98 94  BUN 36* 33* 32* 31* 31* 36* 34*  CREATININE 2.92* 2.79* 2.70* 2.80* 2.85* 2.67* 2.45*  CALCIUM 8.1* 8.1* 7.7* 8.0* 8.0* 7.8* 8.2*  PHOS 3.8 4.0 4.1 3.7 4.3 3.8 4.2   CBC Recent Labs  Lab 05/16/19 1959 05/16/19 1959 05/17/19 0122 05/17/19 0122 05/17/19 1226 05/18/19 0249 05/18/19 0909 05/19/19 0542  WBC 10.8*  --  11.3*  --   --  9.9  --  8.6  HGB 7.7*   < > 7.2*   < > 7.1* 6.6* 8.0* 7.4*  HCT 23.9*   < > 22.7*   < > 22.6* 19.9* 25.3* 23.3*  MCV 87.5  --  89.7  --   --  86.5  --  90.0  PLT 352  --  358  --   --  333  --  312   < > = values in this interval not displayed.    Medications:    . sodium chloride   Intravenous Once  . atorvastatin  80 mg Oral q1800  . Chlorhexidine Gluconate Cloth  6 each Topical Daily  . feeding supplement  1 Container Oral TID BM  . feeding supplement (PRO-STAT SUGAR FREE 64)  30 mL Oral TID WC  . influenza vaccine adjuvanted  0.5 mL Intramuscular Tomorrow-1000  . levothyroxine  100 mcg Oral Q0600  . lidocaine  1 application Urethral Once  . LORazepam  0.5 mg Intravenous Once  . pneumococcal 23 valent vaccine  0.5 mL Intramuscular Tomorrow-1000  . sodium bicarbonate  650 mg Oral TID  . vitamin B-12  1,000 mcg  Oral Daily      Madelon Lips, MD

## 2019-05-19 NOTE — Progress Notes (Signed)
Patient is s/p kidney biopsy; spoke to Dr. Kathlene Cote about the timing of when to restart heparin drip, was told OK to restart at 1730. Pharmacy notified.

## 2019-05-19 NOTE — Progress Notes (Signed)
PROGRESS NOTE    Jeremy Sherman  [id number] DOB: [birth year] DOA: 05/07/2019 PCP: Jeremy Riches, NP   Brief Narrative:  HPI On 05/08/2019 by Dr. Shela Leff [facility] is a 69 y.o. male with medical history significant of CAD status post CABG, hypertension, hyperlipidemia, hypothyroidism, prediabetes, recent hospital admission for stroke, carotid artery disease status post stent on Eliquis and Brilinta, left atrial appendage thrombus presenting to the ED for evaluation of abnormal labs.  Patient was called by his physician after outpatient labs revealed worsening renal failure.  Patient appears confused and is not sure why he is here.  He has no complaints.  Denies fevers, chills, chest pain, cough, shortness of breath, nausea, vomiting, abdominal pain, diarrhea, flank pain, or dysuria.  No family available at this time.  Per ED provider's conversation with the patient's wife since his discharge from rehab 2 weeks ago he has had slowly worsening confusion.  He has an indwelling Foley catheter since his prior hospitalization which has been draining urine.  Interim history Patient found to have acute kidney injury and nephrology consulted and following.  Also developed fever with worsening leukocytosis, work-up thus far has been unremarkable.  There was initial concern of CAUTI when he was first admitted however repeat urine cultures show no growth. Assessment & Plan   Acute kidney injury -Creatinine on admission 4.72 (baseline approximately 1) -Today creatinine down to 2.45 -Nephrology consulted and appreciated- discussed with Dr. Hollie Salk, planning for renal biopsy- IR consulted- likely today 05/19/2019 -Renal ultrasound unremarkable -ANCA negative -Patient was placed on IV fluids -Continue to monitor BMP  M spike -patient noted to have M spike and pending renal biopsy -will need to discuss with oncology once renal biopsy is completed and  resulted  Complicated UTI/pyelonephritis/pyuria with hematuria in a patient with chronic indwelling Foley catheter due to urinary retention and BPH -Urine culture initially unremarkable.  Repeat urine culture on 05/13/2019 show no growth  -Foley catheter was changed on 05/13/2019 -Blood cultures show no growth to date -patient was placed on IV ceftriaxone - and completed course during hospitalization  Acute metabolic encephalopathy -Suspect some underlying cognitive impairment -Multifactorial including dehydration, AKI, hyperammonemia, low B12, tramadol use -Appears to be improving, although unsure patient's baseline  Normocytic anemia -Certainly iron deficiency anemia versus anemia of chronic disease -Hemoglobin dropped to 6.6 and patient was transfused 1 unit PRBC.  Hemoglobin currently 7.4 -Iron saturation 9, B12 142 -Was given B12 supplementation along with IV iron and ESA per nephrology -FOBT negative -Of note, patient on Brilinta and Eliquis- will hold for renal biopsy and placed on heparin -CT abdomen pelvis made no mention of bleed  History of CVA -Left CAS/left MCA stenosis status post left CEA and left MCA stent in January 2021.  On Brilinta.  Patient also noted to have a left atrial appendage thrombus was placed on Eliquis. -Currently no focal neuro deficits other than lower extremity bilateral weakness.  Patient was discharged home from rehab on 04/24/2019. -Continue Brilinta, Eliquis, statin- as above- hold Brilinta and Eliquis  -PT, OT recommending SNF  History of coronary artery disease -Patient with CABG in 2019 -Currently stable, no complaints of chest pain  Essential hypertension -Currently on no meds, will add on meds as needed  Hypothyroidism -Continue Synthroid -TSH 6.5- however this was a hemolyzed sample  Metabolic acidosis  -continue sodium bicarb  Hyponatremia -Resolved  Hyperphosphatemia -Likely due to renal failure -Treatment per  nephrology  Generalized weakness with debility -PT and OT  as above  Chronic pain -Continue scheduled Tylenol, avoid sedating medications   GERD -Continue PPI  Gallbladder thickening -CT A/P: Her wall thickening, without evidence of gallstones. -Patient with no abdominal pain on exam  DVT Prophylaxis Eliquis--> Heparin  Code Status: Full  Family Communication: None at bedside. Wife via phone.  Disposition Plan: Admitted from home for acute metabolic encephalopathy along with acute kidney injury.  Pending renal biopsy on 05/19/2019.  Now with anemia.  Suspect disposition to SNF when stable.   Consultants Nephrology Interventional radiology  Procedures  Renal ultrasound  Antibiotics   Anti-infectives (From admission, onward)   Start     Dose/Rate Route Frequency Ordered Stop   05/13/19 1045  cefTRIAXone (ROCEPHIN) 1 g in sodium chloride 0.9 % 100 mL IVPB  Status:  Discontinued     1 g 200 mL/hr over 30 Minutes Intravenous Every 24 hours 05/13/19 1036 05/18/19 1024   05/08/19 0130  cefTRIAXone (ROCEPHIN) 1 g in sodium chloride 0.9 % 100 mL IVPB  Status:  Discontinued     1 g 200 mL/hr over 30 Minutes Intravenous Daily at bedtime 05/08/19 0100 05/09/19 [id number]      Subjective:   [facility] seen and examined today.  Patient very tearful this morning and wanting me to call his wife.  Feels very overwhelmed.  Does not know what is going on.  Denies current abdominal pain, chest pain or shortness of breath.  Objective:   Vitals:   05/18/19 0857 05/18/19 1826 05/18/19 2156 05/19/19 0601  BP: (!) 151/71 (!) 145/80 124/65 (!) 160/78  Pulse: 67 70 70 67  Resp: 18 16 18 18   Temp: 98.6 F (37 C) 98.5 F (36.9 C) (!) 100.7 F (38.2 C) 98.4 F (36.9 C)  TempSrc: Oral Oral Oral Oral  SpO2: 98% 96% 97% 100%  Weight:      Height:        Intake/Output Summary (Last 24 hours) at 05/19/2019 0855 Last data filed at 05/19/2019 0543 Gross per 24 hour  Intake 590.46 ml  Output  900 ml  Net -309.54 ml   Filed Weights   05/11/19 2031 05/13/19 2059 05/17/19 2119  Weight: 97.6 kg 97.9 kg 99.8 kg   Exam  General: Well developed, chronically ill appearing, NAD  HEENT: NCAT, mucous membranes moist.   Cardiovascular: S1 S2 auscultated, RRR  Respiratory: Clear to auscultation bilaterally   Abdomen: Soft, nontender, nondistended, + bowel sounds  Extremities: warm dry without cyanosis clubbing or edema  Neuro: AAOx2 (self, place), nonfocal  Psych: Anxious, tearful  Data Reviewed: I have personally reviewed following labs and imaging studies  CBC: Recent Labs  Lab 05/16/19 0347 05/16/19 0347 05/16/19 1959 05/16/19 1959 05/17/19 0122 05/17/19 1226 05/18/19 0249 05/18/19 0909 05/19/19 0542  WBC 13.8*  --  10.8*  --  11.3*  --  9.9  --  8.6  HGB 8.0*   < > 7.7*   < > 7.2* 7.1* 6.6* 8.0* 7.4*  HCT 25.5*   < > 23.9*   < > 22.7* 22.6* 19.9* 25.3* 23.3*  MCV 90.4  --  87.5  --  89.7  --  86.5  --  90.0  PLT 370  --  352  --  358  --  333  --  312   < > = values in this interval not displayed.   Basic Metabolic Panel: Recent Labs  Lab 05/13/19 0328 05/14/19 0247 05/15/19 0320 05/16/19 0347 05/17/19 0122 05/18/19 0249 05/19/19 0542  NA  137   < > 136 137 136 135 138  K 4.1   < > 4.1 4.0 3.9 4.0 3.9  CL 107   < > 107 106 107 106 106  CO2 18*   < > 18* 17* 19* 19* 21*  GLUCOSE 93   < > 113* 85 90 98 94  BUN 36*   < > 32* 31* 31* 36* 34*  CREATININE 2.92*   < > 2.70* 2.80* 2.85* 2.67* 2.45*  CALCIUM 8.1*   < > 7.7* 8.0* 8.0* 7.8* 8.2*  MG 2.4  --   --   --   --   --   --   PHOS 3.8   < > 4.1 3.7 4.3 3.8 4.2   < > = values in this interval not displayed.   GFR: Estimated Creatinine Clearance: 35.4 mL/min (A) (by C-G formula based on SCr of 2.45 mg/dL (H)). Liver Function Tests: Recent Labs  Lab 05/15/19 0320 05/16/19 0347 05/17/19 0122 05/18/19 0249 05/19/19 0542  AST  --   --  72*  --   --   ALT  --   --  36  --   --   ALKPHOS  --   --   247*  --   --   BILITOT  --   --  0.9  --   --   PROT  --   --  6.7  --   --   ALBUMIN 1.9* 1.9* 1.8* 1.6* 1.6*   No results for input(s): LIPASE, AMYLASE in the last 168 hours. No results for input(s): AMMONIA in the last 168 hours. Coagulation Profile: Recent Labs  Lab 05/19/19 0542  INR 1.4*   Cardiac Enzymes: No results for input(s): CKTOTAL, CKMB, CKMBINDEX, TROPONINI in the last 168 hours. BNP (last 3 results) No results for input(s): PROBNP in the last 8760 hours. HbA1C: No results for input(s): HGBA1C in the last 72 hours. CBG: No results for input(s): GLUCAP in the last 168 hours. Lipid Profile: No results for input(s): CHOL, HDL, LDLCALC, TRIG, CHOLHDL, LDLDIRECT in the last 72 hours. Thyroid Function Tests: No results for input(s): TSH, T4TOTAL, FREET4, T3FREE, THYROIDAB in the last 72 hours. Anemia Panel: No results for input(s): VITAMINB12, FOLATE, FERRITIN, TIBC, IRON, RETICCTPCT in the last 72 hours. Urine analysis:    Component Value Date/Time   COLORURINE YELLOW 05/13/2019 0954   APPEARANCEUR HAZY (A) 05/13/2019 0954   LABSPEC 1.016 05/13/2019 0954   PHURINE 6.0 05/13/2019 0954   GLUCOSEU NEGATIVE 05/13/2019 0954   HGBUR LARGE (A) 05/13/2019 0954   BILIRUBINUR NEGATIVE 05/13/2019 0954   KETONESUR NEGATIVE 05/13/2019 0954   PROTEINUR 100 (A) 05/13/2019 0954   NITRITE NEGATIVE 05/13/2019 0954   LEUKOCYTESUR LARGE (A) 05/13/2019 0954   Sepsis Labs: @LABRCNTIP (procalcitonin:4,lacticidven:4)  ) Recent Results (from the past 240 hour(s))  Culture, blood (routine x 2)     Status: None   Collection Time: 05/13/19 10:04 AM   Specimen: BLOOD  Result Value Ref Range Status   Specimen Description BLOOD RIGHT ANTECUBITAL  Final   Special Requests   Final    AEROBIC BOTTLE ONLY Blood Culture results may not be optimal due to an inadequate volume of blood received in culture bottles   Culture   Final    NO GROWTH 5 DAYS Performed at [facility],  [place] [address] [place], [place] [address]    Report Status 05/18/2019 FINAL  Final  Culture, blood (routine x 2)  Status: None   Collection Time: 05/13/19 10:30 AM   Specimen: BLOOD RIGHT ARM  Result Value Ref Range Status   Specimen Description BLOOD RIGHT ARM  Final   Special Requests   Final    AEROBIC BOTTLE ONLY Blood Culture results may not be optimal due to an inadequate volume of blood received in culture bottles   Culture   Final    NO GROWTH 5 DAYS Performed at [facility], [address], [place], [place] [address]    Report Status 05/18/2019 FINAL  Final  Culture, Urine     Status: None   Collection Time: 05/13/19  6:58 PM   Specimen: Urine, Catheterized  Result Value Ref Range Status   Specimen Description URINE, CATHETERIZED  Final   Special Requests NONE  Final   Culture   Final    NO GROWTH Performed at [facility], [address] [place],  [address]    Report Status 05/14/2019 FINAL  Final      Radiology Studies: CT ABDOMEN PELVIS WO CONTRAST  Result Date: 05/18/2019 CLINICAL DATA:  Anemia with mild back pain. EXAM: CT ABDOMEN AND PELVIS WITHOUT CONTRAST TECHNIQUE: Multidetector CT imaging of the abdomen and pelvis was performed following the standard protocol without IV contrast. COMPARISON:  None. FINDINGS: Lower chest: No acute abnormality. Hepatobiliary: No focal liver abnormality is seen. The gallbladder is moderately distended, without evidence of gallstones. Mild gallbladder wall thickening is seen. Very mild pericholecystic inflammation is suspected. Pancreas: Unremarkable. No pancreatic ductal dilatation or surrounding inflammatory changes. Spleen: Normal in size without focal abnormality. Adrenals/Urinary Tract: Adrenal glands are unremarkable. Kidneys are normal in size, without renal calculi or hydronephrosis. An ill-defined 1.1 cm cyst is seen within the anterior aspect of the mid right kidney. A 1.8 cm cyst is seen within the  anteromedial aspect of the mid left kidney. Mild, bilateral nonspecific perinephric inflammatory fat stranding is noted. A Foley catheter is seen within a contracted urinary bladder. Mild diffuse urinary bladder wall thickening is seen. Stomach/Bowel: Stomach is within normal limits. The appendix is not clearly identified. No evidence of bowel dilatation. Noninflamed diverticula are seen within the proximal sigmoid colon. Vascular/Lymphatic: There is mild aortic calcification. No enlarged abdominal or pelvic lymph nodes. Reproductive: Prostate is unremarkable. Other: No abdominal wall hernia or abnormality. No abdominopelvic ascites. Musculoskeletal: Degenerative changes seen throughout the lumbar spine. IMPRESSION: 1. Mild gallbladder wall thickening, without evidence of gallstones. Very mild pericholecystic inflammation is suspected. If there is concern for acute cholecystitis, a right upper quadrant ultrasound could be performed for further evaluation. 2. Bilateral renal cysts with mild, bilateral nonspecific perinephric inflammatory fat stranding. Sequelae associated with acute pyelonephritis cannot be excluded. 3. Noninflamed sigmoid diverticulosis. Aortic Atherosclerosis (ICD10-I70.0). Electronically Signed   By: Virgina Norfolk M.D.   On: 05/18/2019 16:03     Scheduled Meds: . sodium chloride   Intravenous Once  . atorvastatin  80 mg Oral q1800  . Chlorhexidine Gluconate Cloth  6 each Topical Daily  . feeding supplement  1 Container Oral TID BM  . feeding supplement (PRO-STAT SUGAR FREE 64)  30 mL Oral TID WC  . influenza vaccine adjuvanted  0.5 mL Intramuscular Tomorrow-1000  . levothyroxine  100 mcg Oral Q0600  . lidocaine  1 application Urethral Once  . LORazepam  0.5 mg Intravenous Once  . pneumococcal 23 valent vaccine  0.5 mL Intramuscular Tomorrow-1000  . sodium bicarbonate  650 mg Oral TID  . vitamin B-12  1,000 mcg Oral Daily   Continuous Infusions:    LOS: 11 days   Time  Spent in minutes   30 minutes  Cauy Melody D.O. on 05/19/2019 at 8:55 AM  Between 7am to 7pm - Please see pager noted on [email or web address]  After 7pm go to [email or web address]  And look for the night coverage person covering for me after hours  [facility] Office  [phone]

## 2019-05-19 NOTE — Procedures (Signed)
Interventional Radiology Procedure Note  Procedure: US Guided Biopsy of left kidney  Complications: None  Estimated Blood Loss: < 10 mL  Findings: 16 G core biopsy of left kidney performed under US guidance.  Two core samples obtained and sent to Pathology.  Aki Abalos T. Havilah Topor, M.D Pager:  [phone]    

## 2019-05-19 NOTE — Progress Notes (Signed)
Pt has been NPO since midnight.   Jeremy Sherman Jeremy Amelia Burgard, RN  

## 2019-05-19 NOTE — Progress Notes (Signed)
ANTICOAGULATION CONSULT NOTE - Follow Up Consult  Pharmacy Consult for Apixaban to Heparin Indication: LA thrombus 03/20/19  No Known Allergies  Patient Measurements: Height: 6\' 5"  (195.6 cm) Weight: 220 lb 0.3 oz (99.8 kg) IBW/kg (Calculated) : 89.1  Heparin Dosing Weight:  98 kg  Vital Signs: Temp: 98.3 F (36.8 C) (03/22 0911) Temp Source: Oral (03/22 0911) BP: 147/77 (03/22 1530) Pulse Rate: 70 (03/22 1530)  Labs: Recent Labs    05/17/19 0122 05/17/19 1226 05/17/19 1953 05/18/19 0249 05/18/19 0249 05/18/19 0909 05/19/19 0542  HGB 7.2*   < >  --  6.6*   < > 8.0* 7.4*  HCT 22.7*   < >  --  19.9*  --  25.3* 23.3*  PLT 358  --   --  333  --   --  312  APTT 66*   < > 48* 133*  --   --  100*  LABPROT  --   --   --   --   --   --  17.1*  INR  --   --   --   --   --   --  1.4*  HEPARINUNFRC >2.20*  --   --  >2.20*  --   --  1.68*  CREATININE 2.85*  --   --  2.67*  --   --  2.45*   < > = values in this interval not displayed.    Estimated Creatinine Clearance: 35.4 mL/min (A) (by C-G formula based on SCr of 2.45 mg/dL (H)).  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  Now to begin heparin while Eliquis on hold in anticipation of renal biopsy. Last dose of Eliquis was on 3/17 PM. APTT and HL on 3/18 were both undetectably high.  05/18/19: CT scan negative for abdominal bleed AKI:  Scr peak 4.72 on 3/11, ([place] SCr = 1), [place] trnd 2.85>2.67> 2.45 today.  This morning the heparin level = 1.68 falsely elevated due to prior Apixaban use, PTT = 100 sec which is therapeutic on IV heparin rate of 600 units/hr.  Heparin drip was stopped at 0700 this AM in preparation for renal biopsy today.  Now s/p renal bx , RN reports to me that Dr. Kathlene Cote told her to restart heparin infusion  at 17:30 today.  Hgb 7.4, pltc 312 , Received feraheme x 2 while here. No active bleeding noted.  Will  still follow AM aPTT and HL to assist future dosing given PTA apixaban effect on heparin levels.     Goal of Therapy:  Heparin level 0.3-0.7 units/hr aPTT 66-102 seconds Monitor platelets by anticoagulation protocol: Yes     Plan:  Restart Heparin at 600 units/hr with stop 3/22 at 6am for renal biopsy Follow aPTT until correlating with HL Monitor daily aPTT, HL, CBC/plt Monitor for signs/symptoms of bleeding    Nicole Cella, [facility] Clinical Pharmacist [phone] Please check AMION for all [facility] phone numbers After 10:00 PM, call [place]

## 2019-05-20 ENCOUNTER — Encounter (HOSPITAL_COMMUNITY): Payer: Self-pay | Admitting: Internal Medicine

## 2019-05-20 ENCOUNTER — Inpatient Hospital Stay (HOSPITAL_COMMUNITY): Payer: Medicare Other

## 2019-05-20 DIAGNOSIS — N179 Acute kidney failure, unspecified: Principal | ICD-10-CM

## 2019-05-20 LAB — PREPARE RBC (CROSSMATCH)

## 2019-05-20 LAB — RENAL FUNCTION PANEL
Albumin: 1.5 g/dL — ABNORMAL LOW (ref 3.5–5.0)
Anion gap: 9 (ref 5–15)
BUN: 35 mg/dL — ABNORMAL HIGH (ref 8–23)
CO2: 19 mmol/L — ABNORMAL LOW (ref 22–32)
Calcium: 7.9 mg/dL — ABNORMAL LOW (ref 8.9–10.3)
Chloride: 109 mmol/L (ref 98–111)
Creatinine, Ser: 2.44 mg/dL — ABNORMAL HIGH (ref 0.61–1.24)
GFR calc Af Amer: 30 mL/min — ABNORMAL LOW (ref >60)
GFR calc non Af Amer: 26 mL/min — ABNORMAL LOW (ref >60)
Glucose, Bld: 153 mg/dL — ABNORMAL HIGH (ref 70–99)
Phosphorus: 3.6 mg/dL (ref 2.5–4.6)
Potassium: 3.7 mmol/L (ref 3.5–5.1)
Sodium: 137 mmol/L (ref 135–145)

## 2019-05-20 LAB — UPEP/UIFE/LIGHT CHAINS/TP, 24-HR UR
% BETA, Urine: UNDETERMINED %
ALPHA 1 URINE: UNDETERMINED %
Albumin, U: UNDETERMINED %
Alpha 2, Urine: UNDETERMINED %
Free Kappa Lt Chains,Ur: 1765.07 mg/L — ABNORMAL HIGH (ref 0.63–113.79)
Free Kappa/Lambda Ratio: 6.99 (ref 1.03–31.76)
Free Lambda Lt Chains,Ur: 252.48 mg/L — ABNORMAL HIGH (ref 0.47–11.77)
GAMMA GLOBULIN URINE: UNDETERMINED %
Immunofixation Result, Urine: UNDETERMINED
M-SPIKE %, Urine: UNDETERMINED %
Total Protein, Urine-Ur/day: 1468 mg/24 hr — ABNORMAL HIGH (ref 30–150)
Total Protein, Urine: 146.8 mg/dL

## 2019-05-20 LAB — CBC
HCT: 21.1 % — ABNORMAL LOW (ref 39.0–52.0)
Hemoglobin: 6.7 g/dL — CL (ref 13.0–17.0)
MCH: 28 pg (ref 26.0–34.0)
MCHC: 31.8 g/dL (ref 30.0–36.0)
MCV: 88.3 fL (ref 80.0–100.0)
Platelets: 317 10*3/uL (ref 150–400)
RBC: 2.39 MIL/uL — ABNORMAL LOW (ref 4.22–5.81)
RDW: 18.1 % — ABNORMAL HIGH (ref 11.5–15.5)
WBC: 9.6 10*3/uL (ref 4.0–10.5)
nRBC: 0 % (ref 0.0–0.2)

## 2019-05-20 LAB — HEPARIN LEVEL (UNFRACTIONATED): Heparin Unfractionated: 1.22 IU/mL — ABNORMAL HIGH (ref 0.30–0.70)

## 2019-05-20 LAB — APTT
aPTT: 65 seconds — ABNORMAL HIGH (ref 24–36)
aPTT: 91 seconds — ABNORMAL HIGH (ref 24–36)

## 2019-05-20 LAB — HEMOGLOBIN AND HEMATOCRIT, BLOOD
HCT: 26.7 % — ABNORMAL LOW (ref 39.0–52.0)
Hemoglobin: 8.7 g/dL — ABNORMAL LOW (ref 13.0–17.0)

## 2019-05-20 LAB — SAVE SMEAR(SSMR), FOR PROVIDER SLIDE REVIEW

## 2019-05-20 MED ORDER — HEPARIN (PORCINE) 25000 UT/250ML-% IV SOLN
700.0000 [IU]/h | INTRAVENOUS | Status: DC
  Administered 2019-05-20: 600 [IU]/h via INTRAVENOUS
  Administered 2019-05-22: 700 [IU]/h via INTRAVENOUS
  Filled 2019-05-20 (×2): qty 250

## 2019-05-20 MED ORDER — FENTANYL CITRATE (PF) 100 MCG/2ML IJ SOLN
INTRAMUSCULAR | Status: AC | PRN
  Administered 2019-05-19: 25 ug via INTRAVENOUS

## 2019-05-20 MED ORDER — MIDAZOLAM HCL 2 MG/2ML IJ SOLN
INTRAMUSCULAR | Status: AC | PRN
  Administered 2019-05-19: 0.5 mg via INTRAVENOUS

## 2019-05-20 MED ORDER — SODIUM CHLORIDE 0.9% IV SOLUTION: Freq: Once | INTRAVENOUS | Status: AC

## 2019-05-20 NOTE — Plan of Care (Signed)
  Problem: Activity: Goal: Activity intolerance will improve Outcome: Progressing   

## 2019-05-20 NOTE — Progress Notes (Signed)
[facility] Progress Note    Assessment/ Plan:   Pt is a 69 y.o. yo male with HTN, CAD who was admitted on 05/07/2019 with AKI (crt 1.08 on 04/21/19)    1. AKI-  True AKI from normal crt on 04/21/19.  Left hospital with indwelling foley-  Has stayed in place and changed out per home health.  Many new meds per cardiology in setting of recent CVA including brilinta and a statin.  U/A showing 100 of prot, white and red blood cells- possible UTI but no growth on culture.  Renal u/s normal with 12 cm kidneys.  Is non oliguric. BP has not been low.  CK 78.  ANA negative, complements WNL and ANCA negative. Protein to crt ratio 0.3 g  Will do SPEP/ free light chains--> M-spike 1.0 and abnormal K/L FLC ratio. Has not had + urine culture in 2021.  S/p renal biopsy 3/22, should have prelim 3/21.  2. + M-spike- onc has been consulted.   3.  Fever/ vomiting: Urine, blood cultures unrevealing, AXR unrevealing; off abx for now.  4. HTN/volume-  s/p IVFs, po intake for now. 5 Anemia-  Received feraheme x 2 while here.  Hb 8.7 this AM it appears after transfusion overnight, has been on heparin gtt for procedures.  CT negative for RP bleed. 6 Metabolic acidosis - on oral bicarb - improving some 7.  Recent CVA: holding brilinta and Eliquis for procedures, on hep gtt 8.  Dispo: Pending biopsy -  prelim results Wed most likely.   Subjective:    S/p renal biopsy yesterday. Onc c/s today. He has no complaints.   Objective:   BP (!) 148/74 (BP Location: Left Arm)   Pulse 77   Temp 98.6 F (37 C) (Oral)   Resp 18   Ht 6' 5"  (1.956 m)   Wt 99.8 kg   SpO2 100%   BMI 26.09 kg/m   Intake/Output Summary (Last 24 hours) at 05/20/2019 1211 Last data filed at 05/20/2019 0900 Gross per 24 hour  Intake 885.09 ml  Output 1175 ml  Net -289.91 ml   Weight change:   Physical Exam: Gen: sitting in chair eating cake, NAD CVS: RRR Resp: clear Abd: soft, nontender XFG:HWEXH LE edema, very ttp c/w  yesterday  Imaging: CT ABDOMEN PELVIS WO CONTRAST  Result Date: 05/18/2019 CLINICAL DATA:  Anemia with mild back pain. EXAM: CT ABDOMEN AND PELVIS WITHOUT CONTRAST TECHNIQUE: Multidetector CT imaging of the abdomen and pelvis was performed following the standard protocol without IV contrast. COMPARISON:  None. FINDINGS: Lower chest: No acute abnormality. Hepatobiliary: No focal liver abnormality is seen. The gallbladder is moderately distended, without evidence of gallstones. Mild gallbladder wall thickening is seen. Very mild pericholecystic inflammation is suspected. Pancreas: Unremarkable. No pancreatic ductal dilatation or surrounding inflammatory changes. Spleen: Normal in size without focal abnormality. Adrenals/Urinary Tract: Adrenal glands are unremarkable. Kidneys are normal in size, without renal calculi or hydronephrosis. An ill-defined 1.1 cm cyst is seen within the anterior aspect of the mid right kidney. A 1.8 cm cyst is seen within the anteromedial aspect of the mid left kidney. Mild, bilateral nonspecific perinephric inflammatory fat stranding is noted. A Foley catheter is seen within a contracted urinary bladder. Mild diffuse urinary bladder wall thickening is seen. Stomach/Bowel: Stomach is within normal limits. The appendix is not clearly identified. No evidence of bowel dilatation. Noninflamed diverticula are seen within the proximal sigmoid colon. Vascular/Lymphatic: There is mild aortic calcification. No enlarged abdominal or pelvic  lymph nodes. Reproductive: Prostate is unremarkable. Other: No abdominal wall hernia or abnormality. No abdominopelvic ascites. Musculoskeletal: Degenerative changes seen throughout the lumbar spine. IMPRESSION: 1. Mild gallbladder wall thickening, without evidence of gallstones. Very mild pericholecystic inflammation is suspected. If there is concern for acute cholecystitis, a right upper quadrant ultrasound could be performed for further evaluation. 2.  Bilateral renal cysts with mild, bilateral nonspecific perinephric inflammatory fat stranding. Sequelae associated with acute pyelonephritis cannot be excluded. 3. Noninflamed sigmoid diverticulosis. Aortic Atherosclerosis (ICD10-I70.0). Electronically Signed   By: Virgina Norfolk M.D.   On: 05/18/2019 16:03   DG Bone Survey Met  Result Date: 05/20/2019 CLINICAL DATA:  Acute renal failure. 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 No lytic or sclerotic bone lesions. No lytic myelomatous lesions are identified involving the axial or appendicular skeleton. Degenerative changes involving the spine but no bone lesions. Moderate scattered vascular calcifications. Left carotid artery stent noted. Median sternotomy wires are noted. IMPRESSION: Unremarkable bone survey. No lytic or sclerotic bone lesions are identified. Electronically Signed   By: Marijo Sanes M.D.   On: 05/20/2019 11:18   US BIOPSY (KIDNEY)  Result Date: 05/19/2019 INDICATION: Worsening renal function and need for renal biopsy. EXAM: ULTRASOUND GUIDED CORE BIOPSY OF LEFT KIDNEY MEDICATIONS: None. ANESTHESIA/SEDATION: Fentanyl 50 mcg IV; Versed 1.0 mg IV Moderate Sedation Time:  15 minutes. The patient was continuously monitored during the procedure by the interventional radiology nurse under my direct supervision. PROCEDURE: The procedure, risks, benefits, and alternatives were explained to the patient. Questions regarding the procedure were encouraged and answered. The patient understands and consents to the procedure. A time-out was performed prior to initiating the procedure. Both kidneys were examined by ultrasound in a prone position. The left flank region was prepped with chlorhexidine in a sterile fashion, and a sterile drape was applied covering the operative field. A sterile gown and sterile gloves were used for the procedure.  Local anesthesia was provided with 1% Lidocaine. Under ultrasound guidance, 2 separate 16 gauge core biopsy samples were obtained at the level of lower pole renal cortex. Core biopsy samples were submitted in saline. Additional ultrasound was performed after removing the biopsy needle. COMPLICATIONS: None immediate. FINDINGS: The left kidney was chosen for biopsy. Both were well visualized by ultrasound. Solid material was obtained at the level of the left renal cortex. IMPRESSION: Ultrasound-guided core biopsy performed of the left kidney at the level of lower pole renal cortex. Electronically Signed   By: Aletta Edouard M.D.   On: 05/19/2019 16:02    Labs: BMET Recent Labs  Lab 05/14/19 0247 05/15/19 0320 05/16/19 0347 05/17/19 0122 05/18/19 0249 05/19/19 0542 05/20/19 0012  NA 137 136 137 136 135 138 137  K 3.9 4.1 4.0 3.9 4.0 3.9 3.7  CL 107 107 106 107 106 106 109  CO2 20* 18* 17* 19* 19* 21* 19*  GLUCOSE 95 113* 85 90 98 94 153*  BUN 33* 32* 31* 31* 36* 34* 35*  CREATININE 2.79* 2.70* 2.80* 2.85* 2.67* 2.45* 2.44*  CALCIUM 8.1* 7.7* 8.0* 8.0* 7.8* 8.2* 7.9*  PHOS 4.0 4.1 3.7 4.3 3.8 4.2 3.6   CBC Recent Labs  Lab 05/17/19 0122 05/17/19 1226 05/18/19 0249 05/18/19 0249 05/18/19 0909 05/19/19 0542 05/20/19 0012 05/20/19 0807  WBC 11.3*  --  9.9  --   --  8.6 9.6  --   HGB 7.2*   < >  6.6*   < > 8.0* 7.4* 6.7* 8.7*  HCT 22.7*   < > 19.9*   < > 25.3* 23.3* 21.1* 26.7*  MCV 89.7  --  86.5  --   --  90.0 88.3  --   PLT 358  --  333  --   --  312 317  --    < > = values in this interval not displayed.    Medications:    . atorvastatin  80 mg Oral q1800  . Chlorhexidine Gluconate Cloth  6 each Topical Daily  . feeding supplement  1 Container Oral TID BM  . feeding supplement (PRO-STAT SUGAR FREE 64)  30 mL Oral TID WC  . influenza vaccine adjuvanted  0.5 mL Intramuscular Tomorrow-1000  . levothyroxine  100 mcg Oral Q0600  . pneumococcal 23 valent vaccine  0.5 mL  Intramuscular Tomorrow-1000  . sodium bicarbonate  650 mg Oral TID  . vitamin B-12  1,000 mcg Oral Daily      Madelon Lips, MD

## 2019-05-20 NOTE — Consult Note (Signed)
Chief Complaint: Patient was seen in consultation today for bone marrow biopsy Chief Complaint  Patient presents with  . abnormal labs   at the request of Dr Curly Shores   Supervising Physician: Arne Cleveland  Patient Status: [facility] - In-pt  History of Present Illness: Jeremy Sherman is a 69 y.o. male   CAD/CABG; HTN; HLD; CVA CAD post stent on Eliquis and Brilinta Confusion Worsening renal function and followed by Nephrology Random renal biopsy performed yesterday-- pending result  As part of his work-up, he also had an SPEP and light chains.  On 05/12/2019, his M spike was 1.0, kappa free light chain to 20.7, lambda free light chain 40.9, and kappa, lambda light chain ratio 5.40.  UPEP has been ordered and appears to be in progress.  Immunofixation was ordered earlier today and results are pending  Oncology note today: Recommendations: 1.  I briefly discussed with the patient his laboratory findings and concern for underlying multiple myeloma.  He has limited insight into this discussion.  Recommend bone marrow biopsy by interventional radiology to confirm this diagnosis. 2.  We will also await the results of his renal biopsy, immunofixation, and UPEP. 3.  Once these results are obtained, will have a further discussion with the patient and family members regarding treatment options.  Request for Bone marrow biopsy Plan for same in IR 3/25    Past Medical History:  Diagnosis Date  . AKI (acute kidney injury) ([place]) 04/2019  . CAD (coronary artery disease)   . Carotid stenosis, left    L ICA 50%  . Hyperlipidemia   . Hypertension   . Hypertensive urgency 03/11/2019  . 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 Prediabetes   . Stroke [facility])     Past Surgical History:  Procedure Laterality Date  . CORONARY ARTERY BYPASS GRAFT  07/02/2007   [place]-> LAD SVG-> PD branch of RCA [name]) at [place] Left 03/26/2019   Procedure: EXPOSURE OF LEFT COMMON CAROTID ARTERY AND PLACEMENT OF SHEATH;  Surgeon: Marty Heck, MD;  Location: [place];  Service: Vascular;  Laterality: Left;  . ENDARTERECTOMY Left 03/26/2019   Procedure: REMOVAL OF CAROTID SHEATH AND CLOSURE OF  CAROTID ARTERY;  Surgeon: Serafina Mitchell, MD;  Location: [facility] OR;  Service: Vascular;  Laterality: Left;  . IR ANGIO INTRA EXTRACRAN SEL COM CAROTID INNOMINATE BILAT MOD SED  03/21/2019  . IR ANGIO INTRA EXTRACRAN SEL INTERNAL CAROTID UNI L MOD SED  03/26/2019  . IR ANGIO VERTEBRAL SEL SUBCLAVIAN INNOMINATE BILAT MOD SED  03/21/2019  . IR ANGIOGRAM FOLLOW UP STUDY  03/26/2019  . IR ANGIOGRAM FOLLOW UP STUDY  03/26/2019  . IR ANGIOGRAM FOLLOW UP STUDY  03/26/2019  . IR ANGIOGRAM FOLLOW UP STUDY  03/26/2019  . IR ANGIOGRAM FOLLOW UP STUDY  03/26/2019  . IR ANGIOGRAM FOLLOW UP STUDY  03/26/2019  . IR CT HEAD LTD  03/26/2019  . IR INTRA CRAN STENT  03/26/2019  . IR INTRAVSC STENT CERV CAROTID W/O EMB-PROT MOD SED INC ANGIO  03/26/2019  . IR NEURO EACH ADD'L AFTER BASIC UNI LEFT (MS)  03/26/2019  . IR TRANSCATH/EMBOLIZ  03/26/2019    Allergies: Patient has no known allergies.  Medications: Prior to Admission medications   Medication Sig Start Date End Date Taking? Authorizing Provider  ammonium lactate (LAC-HYDRIN) 12 % lotion Apply topically 2 (two) times daily. Apply to affected area 04/23/19  Yes Angiulli, Quillian Quince  J, PA-C  apixaban (ELIQUIS) 5 MG TABS tablet Take 1 tablet (5 mg total) by mouth 2 (two) times daily. 04/23/19  Yes Angiulli, Lavon Paganini, PA-C  atorvastatin (LIPITOR) 80 MG tablet Take 1 tablet (80 mg total) by mouth daily at 6 PM. 04/23/19  Yes Angiulli, Lavon Paganini, PA-C  diclofenac Sodium (VOLTAREN) 1 % GEL Apply 2 g topically 4 (four) times daily. 04/23/19  Yes Angiulli, Lavon Paganini, PA-C  levothyroxine (SYNTHROID) 100 MCG tablet Take 1 tablet (100 mcg total) by mouth daily at 6 (six) AM. 04/23/19  Yes Angiulli, Lavon Paganini, PA-C  pantoprazole (PROTONIX)  40 MG tablet Take 1 tablet (40 mg total) by mouth at bedtime. 04/23/19  Yes Angiulli, Lavon Paganini, PA-C  polyethylene glycol (MIRALAX / GLYCOLAX) 17 g packet Take 17 g by mouth daily. Patient taking differently: Take 17 g by mouth daily as needed for mild constipation.  04/24/19  Yes Angiulli, Lavon Paganini, PA-C  senna (SENOKOT) 8.6 MG TABS tablet Take 1 tablet (8.6 mg total) by mouth at bedtime. Patient taking differently: Take 1 tablet by mouth at bedtime as needed for mild constipation.  04/23/19  Yes Angiulli, Lavon Paganini, PA-C  ticagrelor (BRILINTA) 90 MG TABS tablet Take 1 tablet (90 mg total) by mouth 2 (two) times daily. 04/23/19  Yes Angiulli, Lavon Paganini, PA-C  traMADol (ULTRAM) 50 MG tablet Take 1 tablet (50 mg total) by mouth every 6 (six) hours as needed for moderate pain or severe pain. 04/23/19  Yes Angiulli, Lavon Paganini, PA-C     Family History  Problem Relation Age of Onset  . Stroke Mother     Social History   Socioeconomic History  . Marital status: Married    Spouse name: Not on file  . Number of children: Not on file  . Years of education: Not on file  . Highest education level: Not on file  Occupational History  . Not on file  Tobacco Use  . Smoking status: Former Smoker    Packs/day: 0.50    Years: 45.00    Pack years: 22.50    Types: Cigarettes    Start date: 02/27/1974    Quit date: 03/11/2019    Years since quitting: 0.1  . Smokeless tobacco: Never Used  Substance and Sexual Activity  . Alcohol use: Not Currently  . Drug use: Never  . Sexual activity: Not on file  Other Topics Concern  . Not on file  Social History Narrative  . Not on file   Social Determinants of Health   Financial Resource Strain:   . Difficulty of Paying Living Expenses:   Food Insecurity:   . Worried About Charity fundraiser in the Last Year:   . Arboriculturist in the Last Year:   Transportation Needs:   . Film/video editor (Medical):   Marland Kitchen Lack of Transportation (Non-Medical):     Physical Activity:   . Days of Exercise per Week:   . Minutes of Exercise per Session:   Stress:   . Feeling of Stress :   Social Connections:   . Frequency of Communication with Friends and Family:   . Frequency of Social Gatherings with Friends and Family:   . Attends Religious Services:   . Active Member of Clubs or Organizations:   . Attends Archivist Meetings:   Marland Kitchen Marital Status:     Review of Systems: A 12 point ROS discussed and pertinent positives are indicated in the HPI above.  All other systems are negative.  Review of Systems  Constitutional: Positive for activity change, appetite change and fatigue. Negative for fever and unexpected weight change.  Psychiatric/Behavioral: Positive for confusion and decreased concentration.    Vital Signs: BP (!) 148/71 (BP Location: Left Arm)   Pulse 68   Temp 98.6 F (37 C) (Oral)   Resp 18   Ht _0  (1.956 m)   Wt 220 lb 0.3 oz (99.8 kg)   SpO2 99%   BMI 26.09 kg/m   Physical Exam Vitals reviewed.  Cardiovascular:     Rate and Rhythm: Normal rate and regular rhythm.     Heart sounds: Normal heart sounds.  Pulmonary:     Breath sounds: Normal breath sounds.  Abdominal:     Palpations: Abdomen is soft.  Skin:    General: Skin is warm and dry.  Neurological:     Mental Status: He is disoriented.  Psychiatric:     Comments: Consented with wife at bedside      Imaging: CT ABDOMEN PELVIS WO CONTRAST  Result Date: 05/18/2019 CLINICAL DATA:  Anemia with mild back pain. EXAM: CT ABDOMEN AND PELVIS WITHOUT CONTRAST TECHNIQUE: Multidetector CT imaging of the abdomen and pelvis was performed following the standard protocol without IV contrast. COMPARISON:  None. FINDINGS: Lower chest: No acute abnormality. Hepatobiliary: No focal liver abnormality is seen. The gallbladder is moderately distended, without evidence of gallstones. Mild gallbladder wall thickening is seen. Very mild pericholecystic inflammation is  suspected. Pancreas: Unremarkable. No pancreatic ductal dilatation or surrounding inflammatory changes. Spleen: Normal in size without focal abnormality. Adrenals/Urinary Tract: Adrenal glands are unremarkable. Kidneys are normal in size, without renal calculi or hydronephrosis. An ill-defined 1.1 cm cyst is seen within the anterior aspect of the mid right kidney. A 1.8 cm cyst is seen within the anteromedial aspect of the mid left kidney. Mild, bilateral nonspecific perinephric inflammatory fat stranding is noted. A Foley catheter is seen within a contracted urinary bladder. Mild diffuse urinary bladder wall thickening is seen. Stomach/Bowel: Stomach is within normal limits. The appendix is not clearly identified. No evidence of bowel dilatation. Noninflamed diverticula are seen within the proximal sigmoid colon. Vascular/Lymphatic: There is mild aortic calcification. No enlarged abdominal or pelvic lymph nodes. Reproductive: Prostate is unremarkable. Other: No abdominal wall hernia or abnormality. No abdominopelvic ascites. Musculoskeletal: Degenerative changes seen throughout the lumbar spine. IMPRESSION: 1. Mild gallbladder wall thickening, without evidence of gallstones. Very mild pericholecystic inflammation is suspected. If there is concern for acute cholecystitis, a right upper quadrant ultrasound could be performed for further evaluation. 2. Bilateral renal cysts with mild, bilateral nonspecific perinephric inflammatory fat stranding. Sequelae associated with acute pyelonephritis cannot be excluded. 3. Noninflamed sigmoid diverticulosis. Aortic Atherosclerosis (ICD10-I70.0). Electronically Signed   By: Virgina Norfolk M.D.   On: 05/18/2019 16:03   DG Abd 1 View  Result Date: 05/13/2019 CLINICAL DATA:  Abdominal pain. EXAM: ABDOMEN - 1 VIEW COMPARISON:  March 23, 2019. FINDINGS: The bowel gas pattern is normal. No radio-opaque calculi or other significant radiographic abnormality are seen.  IMPRESSION: Negative. Electronically Signed   By: Marijo Conception M.D.   On: 05/13/2019 16:26   CT HEAD WO CONTRAST  Result Date: 05/08/2019 CLINICAL DATA:  69 year old male with encephalopathy. EXAM: CT HEAD WITHOUT CONTRAST TECHNIQUE: Contiguous axial images were obtained from the base of the skull through the vertex without intravenous contrast. COMPARISON:  Brain MRI 03/18/2019. CTA head and neck 03/26/2019. FINDINGS: Brain: Mild streak artifact associated  with a distal ACA region aneurysm coil pack. No midline shift, ventriculomegaly, mass effect, evidence of mass lesion, intracranial hemorrhage or evidence of cortically based acute infarction. Gray-white matter differentiation is within normal limits throughout the brain. Mild for age white matter hypodensity, most pronounced in the right cerebellum. Vascular: No suspicious intracranial vascular hyperdensity. Skull: No acute osseous abnormality identified. Sinuses/Orbits: The visible paranasal sinuses have cleared since January. Tympanic cavities and mastoids remain well pneumatized. Other: No acute orbit or scalp soft tissue finding. IMPRESSION: 1. No acute intracranial abnormality. 2. Left MCA M1 and distal ACA stents with pericallosal region coil pack redemonstrated. Electronically Signed   By: Genevie Ann M.D.   On: 05/08/2019 02:30   US RENAL  Result Date: 05/08/2019 CLINICAL DATA:  Acute kidney injury EXAM: RENAL / URINARY TRACT ULTRASOUND COMPLETE COMPARISON:  March 24, 2019 FINDINGS: Right Kidney: Renal measurements: 12.5 x 5.7 x 4.2 cm = volume: 156 mL . Echogenicity within normal limits. No mass or hydronephrosis visualized. Left Kidney: Renal measurements: 12.3 x 6.1 x 4.6 cm = volume: 180 mL. Echogenicity within normal limits. No mass or hydronephrosis visualized. Bladder: The bladder is decompressed with a Foley catheter. Other: None. IMPRESSION: No acute abnormality.  No hydronephrosis. Electronically Signed   By: Constance Holster M.D.    On: 05/08/2019 02:49   DG Chest Port 1 View  Result Date: 05/08/2019 CLINICAL DATA:  69 year old male with pain. EXAM: PORTABLE CHEST 1 VIEW COMPARISON:  Chest radiographs 04/02/2019 and earlier. FINDINGS: Portable AP semi upright view at 2349 hours. Right PICC line has been removed since last month. Stable cardiomegaly and mediastinal contours. Lung volumes are at the upper limits of normal. Allowing for portable technique the lungs are clear. Visualized tracheal air column is within normal limits. No pneumothorax. Prior sternotomy. IMPRESSION: Stable cardiomegaly. No acute cardiopulmonary abnormality. Electronically Signed   By: Genevie Ann M.D.   On: 05/08/2019 00:15   DG Bone Survey Met  Result Date: 05/20/2019 CLINICAL DATA:  Acute renal failure. 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 No lytic or sclerotic bone lesions. No lytic myelomatous lesions are identified involving the axial or appendicular skeleton. Degenerative changes involving the spine but no bone lesions. Moderate scattered vascular calcifications. Left carotid artery stent noted. Median sternotomy wires are noted. IMPRESSION: Unremarkable bone survey. No lytic or sclerotic bone lesions are identified. Electronically Signed   By: Marijo Sanes M.D.   On: 05/20/2019 11:18   US BIOPSY (KIDNEY)  Result Date: 05/19/2019 INDICATION: Worsening renal function and need for renal biopsy. EXAM: ULTRASOUND GUIDED CORE BIOPSY OF LEFT KIDNEY MEDICATIONS: None. ANESTHESIA/SEDATION: Fentanyl 50 mcg IV; Versed 1.0 mg IV Moderate Sedation Time:  15 minutes. The patient was continuously monitored during the procedure by the interventional radiology nurse under my direct supervision. PROCEDURE: The procedure, risks, benefits, and alternatives were explained to the patient. Questions regarding the procedure were encouraged and answered. The patient  understands and consents to the procedure. A time-out was performed prior to initiating the procedure. Both kidneys were examined by ultrasound in a prone position. The left flank region was prepped with chlorhexidine in a sterile fashion, and a sterile drape was applied covering the operative field. A sterile gown and sterile gloves were used for the procedure. Local anesthesia was provided with 1% Lidocaine. Under ultrasound guidance, 2 separate 16 gauge core biopsy samples were obtained at the level of lower pole  renal cortex. Core biopsy samples were submitted in saline. Additional ultrasound was performed after removing the biopsy needle. COMPLICATIONS: None immediate. FINDINGS: The left kidney was chosen for biopsy. Both were well visualized by ultrasound. Solid material was obtained at the level of the left renal cortex. IMPRESSION: Ultrasound-guided core biopsy performed of the left kidney at the level of lower pole renal cortex. Electronically Signed   By: Aletta Edouard M.D.   On: 05/19/2019 16:02    Labs:  CBC: Recent Labs    05/17/19 0122 05/17/19 1226 05/18/19 0249 05/18/19 0249 05/18/19 0909 05/19/19 0542 05/20/19 0012 05/20/19 0807  WBC 11.3*  --  9.9  --   --  8.6 9.6  --   HGB 7.2*   < > 6.6*   < > 8.0* 7.4* 6.7* 8.7*  HCT 22.7*   < > 19.9*   < > 25.3* 23.3* 21.1* 26.7*  PLT 358  --  333  --   --  312 317  --    < > = values in this interval not displayed.    COAGS: Recent Labs    03/21/19 0413 03/29/19 0528 05/15/19 1902 05/17/19 1953 05/18/19 0249 05/19/19 0542 05/20/19 0012  INR 1.0 1.4*  --   --   --  1.4*  --   APTT  --   --    < > 48* 133* 100* 91*   < > = values in this interval not displayed.    BMP: Recent Labs    05/17/19 0122 05/18/19 0249 05/19/19 0542 05/20/19 0012  NA 136 135 138 137  K 3.9 4.0 3.9 3.7  CL 107 106 106 109  CO2 19* 19* 21* 19*  GLUCOSE 90 98 94 153*  BUN 31* 36* 34* 35*  CALCIUM 8.0* 7.8* 8.2* 7.9*  CREATININE 2.85*  2.67* 2.45* 2.44*  GFRNONAA 21* 23* 26* 26*  GFRAA 25* 27* 30* 30*    LIVER FUNCTION TESTS: Recent Labs    03/28/19 1553 03/28/19 1553 04/04/19 0750 04/04/19 0750 04/21/19 0550 05/08/19 1948 05/17/19 0122 05/18/19 0249 05/19/19 0542 05/20/19 0012  BILITOT 0.6  --  1.1  --  1.1  --  0.9  --   --   --   AST 34  --  48*  --  15  --  72*  --   --   --   ALT 19  --  52*  --  14  --  36  --   --   --   ALKPHOS 89  --  168*  --  85  --  247*  --   --   --   PROT 6.1*  --  6.3*  --  6.7  --  6.7  --   --   --   ALBUMIN 2.3*   < > 2.3*   < > 2.5*   < > 1.8* 1.6* 1.6* 1.5*   < > = values in this interval not displayed.    TUMOR MARKERS: No results for input(s): AFPTM, CEA, CA199, CHROMGRNA in the last 8760 hours.  Assessment and Plan:  Scheduled for bone marrow biopsy in IR 3/25 Orders in chart for same Wife aware Risks and benefits of Bone marrow bx was discussed with the patient and/or patient's family including, but not limited to bleeding, infection, damage to adjacent structures or low yield requiring additional tests.  All of the questions were answered and there is agreement to proceed. Consent signed and in chart.  Thank you for this interesting consult.  I greatly enjoyed meeting [employer] and look forward to participating in their care.  A copy of this report was sent to the requesting provider on this date.  Electronically Signed: Lavonia Drafts, PA-C 05/20/2019, 2:27 PM   I spent a total of 20 Minutes    in face to face in clinical consultation, greater than 50% of which was counseling/coordinating care for bone marrow bx

## 2019-05-20 NOTE — Progress Notes (Signed)
ANTICOAGULATION CONSULT NOTE - Follow Up Consult  Pharmacy Consult for Heparin (Eliquis on hold) Indication: LA thrombus 03/20/19  No Known Allergies  Patient Measurements: Height: 6\' 5"  (195.6 cm) Weight: 220 lb 0.3 oz (99.8 kg) IBW/kg (Calculated) : 89.1 Heparin Dosing Weight: 98 kg  Vital Signs: Temp: 98.2 F (36.8 C) (03/23 1701) Temp Source: Oral (03/23 1701) BP: 145/70 (03/23 1701) Pulse Rate: 79 (03/23 1701)  Labs: Recent Labs    05/18/19 0249 05/18/19 0909 05/19/19 0542 05/19/19 0542 05/20/19 0012 05/20/19 0807 05/20/19 1836  HGB 6.6*   < > 7.4*   < > 6.7* 8.7*  --   HCT 19.9*   < > 23.3*  --  21.1* 26.7*  --   PLT 333  --  312  --  317  --   --   APTT 133*  --  100*  --  91*  --  65*  LABPROT  --   --  17.1*  --   --   --   --   INR  --   --  1.4*  --   --   --   --   HEPARINUNFRC >2.20*  --  1.68*  --  1.22*  --   --   CREATININE 2.67*  --  2.45*  --  2.44*  --   --    < > = values in this interval not displayed.    Estimated Creatinine Clearance: 35.5 mL/min (A) (by C-G formula based on SCr of 2.44 mg/dL (H)).  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 Pharmacy consulted to begin heparin while Eliquis held in anticipation of renal biopsy. Last dose of Eliquis was on 3/17 PM.  Using aPTT for heparin monitoring while heparin levels are falsely elevated due to recent Eliquis doses.  CT negative for abdominal bleed on 05/18/19.  APTT this evening came back at 65, on 600 units/hr. Hgb up from 6.7 to 8.7 this evening after 1 PRBC, plt 317. No s/sx of bleeding or infusion issues per nursing.   Goal of Therapy:  Heparin level 0.3-0.7 units/ml aPTT 66-102 seconds Monitor platelets by anticoagulation protocol: Yes   Plan:   Increase heparin drip at 700units/hr  aPTT ~8 hrs after rate change   Daily aPTT and heparin level until correlating; daily CBC.  Monitor for  s/sx bleeding.  Eliquis and Brilinta on hold for procedures.  Antonietta Jewel, PharmD, BCCCP Clinical Pharmacist  Phone: [phone]  Please check AMION for all [place] phone numbers After 10:00 PM, call [place] [phone] 05/20/2019,7:37 PM

## 2019-05-20 NOTE — Plan of Care (Signed)
  Problem: Education: Goal: Knowledge of disease and its progression will improve Outcome: Progressing   

## 2019-05-20 NOTE — Progress Notes (Signed)
Physical Therapy Treatment Patient Details Name: Jeremy Sherman MRN: [id number] DOB: April 13, 1950 Today's Date: 05/20/2019    History of Present Illness 69 year old male admitted to ED on 3/10 with AKI, AMS, R chest wall pain. Pt with foley catheter use x2 weeks. Pt with recent hospitalization for CVA and aneurysm repair, d/c from CIR on 04/21/2019. 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  Pt's Eliquis and Brilinta on hold for procedure, he has been placed on heparin.  Per pharmacy note, goal of aPTT 66-102 - currently 91.    PT Comments    Pt required increased time and cues for transfers and initiation of transfers.  Pt limited due to pain in bil LE and confusion.  He was able to ambulate with min A but fatigued easily.  Pt remains high fall risk and with decreased mobility, cont POC and recommendation of SNF>    Follow Up Recommendations  SNF     Equipment Recommendations  Wheelchair cushion (measurements PT);Wheelchair (measurements PT);Rolling walker with 5" wheels;3in1 (PT)    Recommendations for Other Services       Precautions / Restrictions Precautions Precautions: Fall Precaution Comments: crying outbursts Restrictions Weight Bearing Restrictions: No    Mobility  Bed Mobility Overal bed mobility: Needs Assistance       Supine to sit: Mod assist;HOB elevated     General bed mobility comments: required assist to elevated trunk, mod A to scoot to EOB, cues for sequencing and hand placement  Transfers Overall transfer level: Needs assistance Equipment used: Rolling walker (2 wheeled) Transfers: Sit to/from Stand x3 Sit to Stand: Mod assist;From elevated surface         General transfer comment: Required increased time  to stand upright, cues for posture, and cues for safe hand placment  Ambulation/Gait Ambulation/Gait assistance: Min assist Gait Distance (Feet): 5 Feet Assistive device: Rolling walker (2 wheeled) Gait Pattern/deviations: Decreased stride length;Trunk flexed;Step-to pattern Gait velocity: decr   General Gait Details: minA for steadying and RW management, fatigued easily; limited by bil foot pain   Stairs             Wheelchair Mobility    Modified Rankin (Stroke Patients Only)       Balance Overall balance assessment: Needs assistance Sitting-balance support: Feet supported;No upper extremity supported Sitting balance-Leahy Scale: Good  Sat EOB for 5 minutes for vitals and exercise     Standing balance support: Bilateral upper extremity supported;During functional activity Standing balance-Leahy Scale: Poor Standing balance comment: Cues for posture correction, Min A initially progressing to Min guard                             Cognition Arousal/Alertness: Awake/alert Behavior During Therapy: WFL for tasks assessed/performed Overall Cognitive Status: No family/caregiver present to determine baseline cognitive functioning                   Orientation Level: Disoriented to;Time;Situation     Following Commands: Follows one step commands with increased time;Follows one step commands consistently Safety/Judgement: Decreased awareness of safety;Decreased awareness of deficits   Problem Solving: Slow processing;Decreased initiation;Requires verbal cues;Difficulty sequencing;Requires tactile cues        Exercises General Exercises - Lower Extremity Ankle Circles/Pumps: AROM;Both;10 reps;Seated Long Arc Quad: 10 reps;Seated;Both;AROM  General Comments        Pertinent Vitals/Pain Pain Assessment: Faces Faces Pain Scale: Hurts even more Pain Location: Bilateral feet Pain Descriptors / Indicators: Sore;Guarding;Grimacing Pain  Intervention(s): Limited activity within patient's tolerance;Monitored during session;Relaxation    Home Living                      Prior Function            PT Goals (current goals can now be found in the care plan section) Progress towards PT goals: Progressing toward goals    Frequency    Min 2X/week      PT Plan Current plan remains appropriate    Co-evaluation              AM-PAC PT "6 Clicks" Mobility   Outcome Measure  Help needed turning from your back to your side while in a flat bed without using bedrails?: A Little Help needed moving from lying on your back to sitting on the side of a flat bed without using bedrails?: A Lot Help needed moving to and from a bed to a chair (including a wheelchair)?: A Lot Help needed standing up from a chair using your arms (e.g., wheelchair or bedside chair)?: A Lot Help needed to walk in hospital room?: A Little Help needed climbing 3-5 steps with a railing? : A Lot 6 Click Score: 14    End of Session Equipment Utilized During Treatment: Gait belt Activity Tolerance: Patient limited by fatigue;Patient limited by pain Patient left: in chair;with call bell/phone within reach;with chair alarm set(blankets layered under to elevate chair) Nurse Communication: Mobility status PT Visit Diagnosis: Other abnormalities of gait and mobility (R26.89);Muscle weakness (generalized) (M62.81)     Time: [id number] PT Time Calculation (min) (ACUTE ONLY): 25 min  Charges:  $Gait Training: 8-22 mins $Therapeutic Activity: 8-22 mins                     Maggie Font, PT Acute Rehab Services Pager [phone] [facility] [phone] [facility] [phone]    [name] 05/20/2019, 3:38 PM

## 2019-05-20 NOTE — Progress Notes (Signed)
ANTICOAGULATION CONSULT NOTE - Follow Up Consult  Pharmacy Consult for heparin Indication: LA thrombus  Labs: Recent Labs    05/17/19 0122 05/17/19 1226 05/18/19 0249 05/18/19 0249 05/18/19 0909 05/19/19 0542 05/20/19 0012  HGB 7.2*   < > 6.6*   < > 8.0* 7.4*  --   HCT 22.7*   < > 19.9*  --  25.3* 23.3*  --   PLT 358  --  333  --   --  312  --   APTT 66*   < > 133*  --   --  100* 91*  LABPROT  --   --   --   --   --  17.1*  --   INR  --   --   --   --   --  1.4*  --   HEPARINUNFRC >2.20*  --  >2.20*  --   --  1.68*  --   CREATININE 2.85*  --  2.67*  --   --  2.45*  --    < > = values in this interval not displayed.    Assessment/Plan:  69yo male therapeutic on heparin after resumed. Will continue gtt at current rate until off for biopsy and f/u after for anticoag plans.   Wynona Neat, PharmD, BCPS  05/20/2019,12:59 AM

## 2019-05-20 NOTE — Progress Notes (Signed)
ANTICOAGULATION CONSULT NOTE - Follow Up Consult  Pharmacy Consult for Heparin (Eliquis on hold) Indication: LA thrombus 03/20/19  No Known Allergies  Patient Measurements: Height: 6\' 5"  (195.6 cm) Weight: 220 lb 0.3 oz (99.8 kg) IBW/kg (Calculated) : 89.1 Heparin Dosing Weight: 98 kg  Vital Signs: Temp: 98.6 F (37 C) (03/23 0935) Temp Source: Oral (03/23 0935) BP: 148/74 (03/23 0935) Pulse Rate: 77 (03/23 0935)  Labs: Recent Labs    05/18/19 0249 05/18/19 0909 05/19/19 0542 05/19/19 0542 05/20/19 0012 05/20/19 0807  HGB 6.6*   < > 7.4*   < > 6.7* 8.7*  HCT 19.9*   < > 23.3*  --  21.1* 26.7*  PLT 333  --  312  --  317  --   APTT 133*  --  100*  --  91*  --   LABPROT  --   --  17.1*  --   --   --   INR  --   --  1.4*  --   --   --   HEPARINUNFRC >2.20*  --  1.68*  --  1.22*  --   CREATININE 2.67*  --  2.45*  --  2.44*  --    < > = values in this interval not displayed.    Estimated Creatinine Clearance: 35.5 mL/min (A) (by C-G formula based on SCr of 2.44 mg/dL (H)).  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 Pharmacy consulted to begin heparin while Eliquis held in anticipation of renal biopsy. Last dose of Eliquis was on 3/17 PM.  Using aPTT for heparin monitoring while heparin levels are falsely elevated due to recent Eliquis doses. CT negative for abdominal bleed on 05/18/19.    Heparin held 3/22 am for renal biopsy ~3pm.  Drip resumed at 600 units/hr ~5:30 pm on 3/22 per guidance from Dr. Kathlene Cote.  APTT was therapeutic (91 seconds) ~7 hrs after drip resumed. However, drip was held again at 6:40am today due to inadvertent stop time carried over from prior order.  Hgb down 6.7 with 12am lab > 1 unit PRBCs given > 8.7.   Discussed with Dr. Ree Kida.   Goal of Therapy:  Heparin level 0.3-0.7 units/ml aPTT 66-102 seconds Monitor platelets by anticoagulation protocol: Yes    Plan:   Resume heparin drip at 600 units/hr  aPTT ~8 hrs after resuming.  Daily aPTT and heparin level until correlating; daily CBC.  Monitor for s/sx bleeding.  Eliquis and Brilinta on hold for procedures.  Arty Baumgartner, [place] Phone: [phone] 05/20/2019,10:27 AM

## 2019-05-20 NOTE — TOC Progression Note (Signed)
Transition of Care [facility]) - Progression Note    Patient Details  Name: Jeremy Sherman MRN: [id number] Date of Birth: Sep 02, 1950  Transition of Care [facility]) CM/SW Contact  Bartholomew Crews, RN Phone Number: [phone] 05/20/2019, 4:51 PM  Clinical Narrative:    Spoke with patient's spouse on the phone. She had questions concerning patient's critical illness paperwork and needed records from previous hospitalization. Advised to contact medical records. Also discussed 30% coinsurance for SNF, will be roughly $200 per day out of pocket cost for short term rehab. Spouse is considering her options. TOC following for transition needs.       Expected Discharge Plan and Services                                                 Social Determinants of Health (SDOH) Interventions    Readmission Risk Interventions No flowsheet data found.

## 2019-05-20 NOTE — Progress Notes (Addendum)
PROGRESS NOTE    Jeremy Sherman  [id number] DOB: [birth year] DOA: 05/07/2019 PCP: Jeremy Riches, NP   Brief Narrative:  HPI On 05/08/2019 by Dr. Shela Leff Sherman Jeremy Sherman is a 69 y.o. male with medical history significant of CAD status post CABG, hypertension, hyperlipidemia, hypothyroidism, prediabetes, recent hospital admission for stroke, carotid artery disease status post stent on Eliquis and Brilinta, left atrial appendage thrombus presenting to the ED for evaluation of abnormal labs.  Patient was called by his physician after outpatient labs revealed worsening renal failure.  Patient appears confused and is not sure why he is here.  He has no complaints.  Denies fevers, chills, chest pain, cough, shortness of breath, nausea, vomiting, abdominal pain, diarrhea, flank pain, or dysuria.  No family available at this time.  Per ED provider's conversation with the patient's wife since his discharge from rehab 2 weeks ago he has had slowly worsening confusion.  He has an indwelling Foley catheter since his prior hospitalization which has been draining urine.  Interim history Patient found to have acute kidney injury and nephrology consulted and following.  Also developed fever with worsening leukocytosis, work-up thus far has been unremarkable.  There was initial concern of CAUTI when he was first admitted however repeat urine cultures show no growth. Assessment & Plan   Acute kidney injury -Creatinine on admission 4.72 (baseline approximately 1) -Today creatinine down to 2.44 -Nephrology consulted and appreciated- discussed with Dr. Hollie Sherman, planning for renal biopsy -Interventional radiology consulted and appreciated, s/p US guided biopsy of left kidney- biopsy results pending  -Renal ultrasound unremarkable -ANCA negative -Patient was placed on IV fluids -Continue to monitor BMP  M spike -patient noted to have M spike and pending renal biopsy  results -Oncology, Jeremy Sherman, consulted and appreciated. Recommended serum immunofixation and bone survey  Complicated UTI/pyelonephritis/pyuria with hematuria in a patient with chronic indwelling Foley catheter due to urinary retention and BPH -Urine culture initially unremarkable.  Repeat urine culture on 05/13/2019 show no growth  -Foley catheter was changed on 05/13/2019 -Blood cultures show no growth to date -patient was placed on IV ceftriaxone - and completed course during hospitalization  Acute metabolic encephalopathy -Suspect some underlying cognitive impairment -Multifactorial including dehydration, AKI, hyperammonemia, low B12, tramadol use -Appears to be improving, although unsure patient's baseline  Normocytic anemia -Certainly iron deficiency anemia versus anemia of chronic disease -hemoglobin dropped again overnight to 6.7 -has received 2u PRBC during hospitalization, hemoglobin up to 8.7 today -Iron saturation 9, B12 142 -Was given B12 supplementation along with IV iron and ESA per nephrology -FOBT negative -Of note, patient on Brilinta and Eliquis- will hold for renal biopsy and placed on heparin -CT abdomen pelvis made no mention of bleed  History of CVA -Left CAS/left MCA stenosis status post left CEA and left MCA stent in January 2021.  On Brilinta.  Patient also noted to have a left atrial appendage thrombus was placed on Eliquis. -Currently no focal neuro deficits other than lower extremity bilateral weakness.  Patient was discharged home from rehab on 04/24/2019. -Continue Brilinta, Eliquis, statin- as above- hold Brilinta and Eliquis  -PT, OT recommending SNF  History of coronary artery disease -Patient with CABG in 2019 -Currently stable, no complaints of chest pain  Essential hypertension -Currently on no meds, will add on meds as needed  Hypothyroidism -Continue Synthroid -TSH 6.5- however this was a hemolyzed sample  Metabolic acidosis  -continue  sodium bicarb  Hyponatremia -Resolved  Hyperphosphatemia -Likely due to  renal failure -Treatment per nephrology  Generalized weakness with debility -PT and OT as above  Chronic pain -Continue scheduled Tylenol, avoid sedating medications   GERD -Continue PPI  Gallbladder thickening -CT A/P: Her wall thickening, without evidence of gallstones. -Patient with no abdominal pain on exam  DVT Prophylaxis Eliquis--> Heparin  Code Status: Full  Family Communication: None at bedside. Wife via phone on 3/22  Disposition Plan: Admitted from home for acute metabolic encephalopathy along with acute kidney injury.  Pending renal biopsy on 05/19/2019.  Now with anemia.  Suspect disposition to SNF when stable.   Consultants Nephrology Interventional radiology Oncology  Procedures  Renal ultrasound US guided biopsy of left kidney  Antibiotics   Anti-infectives (From admission, onward)   Start     Dose/Rate Route Frequency Ordered Stop   05/13/19 1045  cefTRIAXone (ROCEPHIN) 1 g in sodium chloride 0.9 % 100 mL IVPB  Status:  Discontinued     1 g 200 mL/hr over 30 Minutes Intravenous Every 24 hours 05/13/19 1036 05/18/19 1024   05/08/19 0130  cefTRIAXone (ROCEPHIN) 1 g in sodium chloride 0.9 % 100 mL IVPB  Status:  Discontinued     1 g 200 mL/hr over 30 Minutes Intravenous Daily at bedtime 05/08/19 0100 05/09/19 [id number]      Subjective:   Jeremy Sherman seen and examined today.  Patient tearful today and crying out for help. He wanted his head put down some. Denies pain, chest pain, shortness of breath.   Objective:   Vitals:   05/19/19 2017 05/20/19 0214 05/20/19 0243 05/20/19 0512  BP: 127/71 (!) 146/68 137/70 (!) 159/72  Pulse: 79 71 73 73  Resp:  18 18 18   Temp: 98.5 F (36.9 C) 99.1 F (37.3 C) 99.5 F (37.5 C) 98.3 F (36.8 C)  TempSrc: Oral Oral Oral Oral  SpO2: 94% 100% 96% 100%  Weight:      Height:        Intake/Output Summary (Last 24 hours) at 05/20/2019  0932 Last data filed at 05/20/2019 0640 Gross per 24 hour  Intake 645.09 ml  Output 850 ml  Net -204.91 ml   Filed Weights   05/11/19 2031 05/13/19 2059 05/17/19 2119  Weight: 97.6 kg 97.9 kg 99.8 kg   Exam  General: Well developed, chronically ill appearing, NAD  HEENT: NCAT, mucous membranes moist.   Cardiovascular: S1 S2 auscultated, RRR  Respiratory: Clear to auscultation bilaterally   Abdomen: Soft, nontender, nondistended, + bowel sounds  Extremities: warm dry without cyanosis clubbing or edema  Neuro: AAOx2 (self, place), nonfocal  Psych: Confused, tearful   Data Reviewed: I have personally reviewed following labs and imaging studies  CBC: Recent Labs  Lab 05/16/19 1959 05/16/19 1959 05/17/19 0122 05/17/19 1226 05/18/19 0249 05/18/19 0909 05/19/19 0542 05/20/19 0012 05/20/19 0807  WBC 10.8*  --  11.3*  --  9.9  --  8.6 9.6  --   HGB 7.7*   < > 7.2*   < > 6.6* 8.0* 7.4* 6.7* 8.7*  HCT 23.9*   < > 22.7*   < > 19.9* 25.3* 23.3* 21.1* 26.7*  MCV 87.5  --  89.7  --  86.5  --  90.0 88.3  --   PLT 352  --  358  --  333  --  312 317  --    < > = values in this interval not displayed.   Basic Metabolic Panel: Recent Labs  Lab 05/16/19 0347 05/17/19 0122 05/18/19 0249 05/19/19  0542 05/20/19 0012  NA 137 136 135 138 137  K 4.0 3.9 4.0 3.9 3.7  CL 106 107 106 106 109  CO2 17* 19* 19* 21* 19*  GLUCOSE 85 90 98 94 153*  BUN 31* 31* 36* 34* 35*  CREATININE 2.80* 2.85* 2.67* 2.45* 2.44*  CALCIUM 8.0* 8.0* 7.8* 8.2* 7.9*  PHOS 3.7 4.3 3.8 4.2 3.6   GFR: Estimated Creatinine Clearance: 35.5 mL/min (A) (by C-G formula based on SCr of 2.44 mg/dL (H)). Liver Function Tests: Recent Labs  Lab 05/16/19 0347 05/17/19 0122 05/18/19 0249 05/19/19 0542 05/20/19 0012  AST  --  72*  --   --   --   ALT  --  36  --   --   --   ALKPHOS  --  247*  --   --   --   BILITOT  --  0.9  --   --   --   PROT  --  6.7  --   --   --   ALBUMIN 1.9* 1.8* 1.6* 1.6* 1.5*    No results for input(s): LIPASE, AMYLASE in the last 168 hours. No results for input(s): AMMONIA in the last 168 hours. Coagulation Profile: Recent Labs  Lab 05/19/19 0542  INR 1.4*   Cardiac Enzymes: No results for input(s): CKTOTAL, CKMB, CKMBINDEX, TROPONINI in the last 168 hours. BNP (last 3 results) No results for input(s): PROBNP in the last 8760 hours. HbA1C: No results for input(s): HGBA1C in the last 72 hours. CBG: No results for input(s): GLUCAP in the last 168 hours. Lipid Profile: No results for input(s): CHOL, HDL, LDLCALC, TRIG, CHOLHDL, LDLDIRECT in the last 72 hours. Thyroid Function Tests: No results for input(s): TSH, T4TOTAL, FREET4, T3FREE, THYROIDAB in the last 72 hours. Anemia Panel: No results for input(s): VITAMINB12, FOLATE, FERRITIN, TIBC, IRON, RETICCTPCT in the last 72 hours. Urine analysis:    Component Value Date/Time   COLORURINE YELLOW 05/13/2019 0954   APPEARANCEUR HAZY (A) 05/13/2019 0954   LABSPEC 1.016 05/13/2019 0954   PHURINE 6.0 05/13/2019 0954   GLUCOSEU NEGATIVE 05/13/2019 0954   HGBUR LARGE (A) 05/13/2019 0954   BILIRUBINUR NEGATIVE 05/13/2019 0954   KETONESUR NEGATIVE 05/13/2019 0954   PROTEINUR 100 (A) 05/13/2019 0954   NITRITE NEGATIVE 05/13/2019 0954   LEUKOCYTESUR LARGE (A) 05/13/2019 0954   Sepsis Labs: @LABRCNTIP (procalcitonin:4,lacticidven:4)  ) Recent Results (from the past 240 hour(s))  Culture, blood (routine x 2)     Status: None   Collection Time: 05/13/19 10:04 AM   Specimen: BLOOD  Result Value Ref Range Status   Specimen Description BLOOD RIGHT ANTECUBITAL  Final   Special Requests   Final    AEROBIC BOTTLE ONLY Blood Culture results may not be optimal due to an inadequate volume of blood received in culture bottles   Culture   Final    NO GROWTH 5 DAYS Performed at [facility], [address] [place], [place] [address]    Report Status 05/18/2019 FINAL  Final  Culture, blood (routine x 2)      Status: None   Collection Time: 05/13/19 10:30 AM   Specimen: BLOOD RIGHT ARM  Result Value Ref Range Status   Specimen Description BLOOD RIGHT ARM  Final   Special Requests   Final    AEROBIC BOTTLE ONLY Blood Culture results may not be optimal due to an inadequate volume of blood received in culture bottles   Culture   Final  NO GROWTH 5 DAYS Performed at [facility], [place] [address], [place], [place] [address]    Report Status 05/18/2019 FINAL  Final  Culture, Urine     Status: None   Collection Time: 05/13/19  6:58 PM   Specimen: Urine, Catheterized  Result Value Ref Range Status   Specimen Description URINE, CATHETERIZED  Final   Special Requests NONE  Final   Culture   Final    NO GROWTH Performed at [facility], [address], [place],  [address]    Report Status 05/14/2019 FINAL  Final      Radiology Studies: CT ABDOMEN PELVIS WO CONTRAST  Result Date: 05/18/2019 CLINICAL DATA:  Anemia with mild back pain. EXAM: CT ABDOMEN AND PELVIS WITHOUT CONTRAST TECHNIQUE: Multidetector CT imaging of the abdomen and pelvis was performed following the standard protocol without IV contrast. COMPARISON:  None. FINDINGS: Lower chest: No acute abnormality. Hepatobiliary: No focal liver abnormality is seen. The gallbladder is moderately distended, without evidence of gallstones. Mild gallbladder wall thickening is seen. Very mild pericholecystic inflammation is suspected. Pancreas: Unremarkable. No pancreatic ductal dilatation or surrounding inflammatory changes. Spleen: Normal in size without focal abnormality. Adrenals/Urinary Tract: Adrenal glands are unremarkable. Kidneys are normal in size, without renal calculi or hydronephrosis. An ill-defined 1.1 cm cyst is seen within the anterior aspect of the mid right kidney. A 1.8 cm cyst is seen within the anteromedial aspect of the mid left kidney. Mild, bilateral nonspecific perinephric inflammatory fat stranding is noted. A  Foley catheter is seen within a contracted urinary bladder. Mild diffuse urinary bladder wall thickening is seen. Stomach/Bowel: Stomach is within normal limits. The appendix is not clearly identified. No evidence of bowel dilatation. Noninflamed diverticula are seen within the proximal sigmoid colon. Vascular/Lymphatic: There is mild aortic calcification. No enlarged abdominal or pelvic lymph nodes. Reproductive: Prostate is unremarkable. Other: No abdominal wall hernia or abnormality. No abdominopelvic ascites. Musculoskeletal: Degenerative changes seen throughout the lumbar spine. IMPRESSION: 1. Mild gallbladder wall thickening, without evidence of gallstones. Very mild pericholecystic inflammation is suspected. If there is concern for acute cholecystitis, a right upper quadrant ultrasound could be performed for further evaluation. 2. Bilateral renal cysts with mild, bilateral nonspecific perinephric inflammatory fat stranding. Sequelae associated with acute pyelonephritis cannot be excluded. 3. Noninflamed sigmoid diverticulosis. Aortic Atherosclerosis (ICD10-I70.0). Electronically Signed   By: Virgina Norfolk M.D.   On: 05/18/2019 16:03   US BIOPSY (KIDNEY)  Result Date: 05/19/2019 INDICATION: Worsening renal function and need for renal biopsy. EXAM: ULTRASOUND GUIDED CORE BIOPSY OF LEFT KIDNEY MEDICATIONS: None. ANESTHESIA/SEDATION: Fentanyl 50 mcg IV; Versed 1.0 mg IV Moderate Sedation Time:  15 minutes. The patient was continuously monitored during the procedure by the interventional radiology nurse under my direct supervision. PROCEDURE: The procedure, risks, benefits, and alternatives were explained to the patient. Questions regarding the procedure were encouraged and answered. The patient understands and consents to the procedure. A time-out was performed prior to initiating the procedure. Both kidneys were examined by ultrasound in a prone position. The left flank region was prepped with  chlorhexidine in a sterile fashion, and a sterile drape was applied covering the operative field. A sterile gown and sterile gloves were used for the procedure. Local anesthesia was provided with 1% Lidocaine. Under ultrasound guidance, 2 separate 16 gauge core biopsy samples were obtained at the level of lower pole renal cortex. Core biopsy samples were submitted in saline. Additional ultrasound was performed after removing the biopsy needle.  COMPLICATIONS: None immediate. FINDINGS: The left kidney was chosen for biopsy. Both were well visualized by ultrasound. Solid material was obtained at the level of the left renal cortex. IMPRESSION: Ultrasound-guided core biopsy performed of the left kidney at the level of lower pole renal cortex. Electronically Signed   By: Aletta Edouard M.D.   On: 05/19/2019 16:02     Scheduled Meds: . atorvastatin  80 mg Oral q1800  . Chlorhexidine Gluconate Cloth  6 each Topical Daily  . feeding supplement  1 Container Oral TID BM  . feeding supplement (PRO-STAT SUGAR FREE 64)  30 mL Oral TID WC  . influenza vaccine adjuvanted  0.5 mL Intramuscular Tomorrow-1000  . levothyroxine  100 mcg Oral Q0600  . pneumococcal 23 valent vaccine  0.5 mL Intramuscular Tomorrow-1000  . sodium bicarbonate  650 mg Oral TID  . vitamin B-12  1,000 mcg Oral Daily   Continuous Infusions:    LOS: 12 days   Time Spent in minutes   45 minutes  Rionna Feltes D.O. on 05/20/2019 at 9:32 AM  Between 7am to 7pm - Please see pager noted on [email or web address]  After 7pm go to [email or web address]  And look for the night coverage person covering for me after hours  [facility] Office  [phone]

## 2019-05-20 NOTE — Progress Notes (Signed)
CRITICAL VALUE ALERT  Critical Value:  hgb 6.7  Date & Time Notied:  05/20/2019 @0118   Provider Notified: M. Sharlet Salina, NP  Orders Received/Actions taken: transfuse 1 unit RBC

## 2019-05-20 NOTE — Consult Note (Addendum)
[place]  [phone]) [phone] [phone]   MEDICAL ONCOLOGY - INITIAL CONSULTATION  Referral MD: Dr. Cristal Ford  Reason for Referral: Elevated M spike and light chains  HPI: Mr. Ciresi is a 69 year old male with a past medical history significant for CAD status post CABG, hypertension, hyperlipidemia, hypothyroidism, prediabetes, recent admission for CVA, carotid artery disease status post stent and subsequently placed on Eliquis and Brilinta.  He presented to the emergency room for evaluation of abnormal lab work.  He had labs performed as an outpatient which showed worsening renal function.  Family had reported worsening confusion since discharge from skilled nursing facility 2 weeks prior to this admission.  On admission, his BUN was 43 and creatinine 4.10.  His hemoglobin was noted to be 9.3.  He has been seen by nephrology who perform additional work-up including a renal biopsy that was performed on 05/19/2019.  As part of his work-up, he also had an SPEP and light chains.  On 05/12/2019, his M spike was 1.0, kappa free light chain to 20.7, lambda free light chain 40.9, and kappa, lambda light chain ratio 5.40.  UPEP has been ordered and appears to be in progress.  Immunofixation was ordered earlier today and results are pending.  The patient is sitting up in the recliner chair eating lunch at the time my visit.  He answers questions briefly and then starts crying.  States that he is upset because his son died and he will never see him again.  He is also upset because he cannot remember why he is here in the hospital or what is going on.  He reports that he has no complaints today other than his concern over loss of memory.  He does complain of pain to his legs with light palpation.  Nursing student was in the room at time of visit and reports that he was unable to stand without assistance.  Medical oncology was asked to see the patient to make recommendations  regarding his elevated M spike and light chains.   Past Medical History:  Diagnosis Date  . AKI (acute kidney injury) ([place]) 04/2019  . CAD (coronary artery disease)   . Carotid stenosis, left    L ICA 50%  . Hyperlipidemia   . Hypertension   . Hypertensive urgency 03/11/2019  . 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 Prediabetes   . Stroke [facility])   :  Past Surgical History:  Procedure Laterality Date  . CORONARY ARTERY BYPASS GRAFT  07/02/2007   [place]-> LAD SVG-> PD branch of R[facility] [name]) at [place] Left 03/26/2019   Procedure: EXPOSURE OF LEFT COMMON CAROTID ARTERY AND PLACEMENT OF SHEATH;  Surgeon: Marty Heck, MD;  Location: [facility];  Service: Vascular;  Laterality: Left;  . ENDARTERECTOMY Left 03/26/2019   Procedure: REMOVAL OF CAROTID SHEATH AND CLOSURE OF  CAROTID ARTERY;  Surgeon: Serafina Mitchell, MD;  Location: [facility] OR;  Service: Vascular;  Laterality: Left;  . IR ANGIO INTRA EXTRACRAN SEL COM CAROTID INNOMINATE BILAT MOD SED  03/21/2019  . IR ANGIO INTRA EXTRACRAN SEL INTERNAL CAROTID UNI L MOD SED  03/26/2019  . IR ANGIO VERTEBRAL SEL SUBCLAVIAN INNOMINATE BILAT MOD SED  03/21/2019  . IR ANGIOGRAM FOLLOW UP STUDY  03/26/2019  . IR ANGIOGRAM FOLLOW UP STUDY  03/26/2019  . IR ANGIOGRAM FOLLOW UP STUDY  03/26/2019  . IR ANGIOGRAM FOLLOW UP STUDY  03/26/2019  . IR  ANGIOGRAM FOLLOW UP STUDY  03/26/2019  . IR ANGIOGRAM FOLLOW UP STUDY  03/26/2019  . IR CT HEAD LTD  03/26/2019  . IR INTRA CRAN STENT  03/26/2019  . IR INTRAVSC STENT CERV CAROTID W/O EMB-PROT MOD SED INC ANGIO  03/26/2019  . IR NEURO EACH ADD'L AFTER BASIC UNI LEFT ([facility])  03/26/2019  . IR TRANSCATH/EMBOLIZ  03/26/2019  :  Current Facility-Administered Medications  Medication Dose Route Frequency Provider Last Rate Last Admin  . acetaminophen (TYLENOL) tablet 650 mg  650 mg Oral Q6H PRN Dana Allan I, MD   650 mg at 05/19/19 2117  . atorvastatin (LIPITOR) tablet 80  mg  80 mg Oral q1800 Wendee Beavers T, MD   80 mg at 05/19/19 1749  . Chlorhexidine Gluconate Cloth 2 % PADS 6 each  6 each Topical Daily Mercy Riding, MD   6 each at 05/20/19 1131  . feeding supplement (BOOST / RESOURCE BREEZE) liquid 1 Container  1 Container Oral TID BM Cristal Ford, DO   1 Container at 05/20/19 1132  . feeding supplement (PRO-STAT SUGAR FREE 64) liquid 30 mL  30 mL Oral TID WC Mikhail, Maryann, DO   30 mL at 05/20/19 1133  . heparin ADULT infusion 100 units/mL (25000 units/245m sodium chloride 0.45%)  600 Units/hr Intravenous Continuous ESkeet Simmer RPH 6 mL/hr at 05/20/19 1131 600 Units/hr at 05/20/19 1131  . influenza vaccine adjuvanted (FLUAD) injection 0.5 mL  0.5 mL Intramuscular Tomorrow-1000 GCorliss Parish MD      . levothyroxine (SYNTHROID) tablet 100 mcg  100 mcg Oral Q0600 GWendee BeaversT, MD   100 mcg at 05/20/19 0624  . ondansetron (ZOFRAN) injection 4 mg  4 mg Intravenous Q8H PRN GWendee BeaversT, MD   4 mg at 05/14/19 2225  . pneumococcal 23 valent vaccine (PNEUMOVAX-23) injection 0.5 mL  0.5 mL Intramuscular Tomorrow-1000 GCorliss Parish MD      . promethazine (PHENERGAN) injection 12.5 mg  12.5 mg Intravenous Q8H PRN GWendee BeaversT, MD   12.5 mg at 05/13/19 1058  . sodium bicarbonate tablet 650 mg  650 mg Oral TID FClaudia Desanctis MD   650 mg at 05/20/19 1131  . sodium chloride (OCEAN) 0.65 % nasal spray 1 spray  1 spray Each Nare PRN EOmar Person NP   1 spray at 05/16/19 0136  . vitamin B-12 (CYANOCOBALAMIN) tablet 1,000 mcg  1,000 mcg Oral Daily GWendee BeaversT, MD   1,000 mcg at 05/20/19 1131     No Known Allergies:  Family History  Problem Relation Age of Onset  . Stroke Mother   :  Social History   Socioeconomic History  . Marital status: Married    Spouse name: Not on file  . Number of children: Not on file  . Years of education: Not on file  . Highest education level: Not on file  Occupational History  . Not on file   Tobacco Use  . Smoking status: Former Smoker    Packs/day: 0.50    Years: 45.00    Pack years: 22.50    Types: Cigarettes    Start date: 02/27/1974    Quit date: 03/11/2019    Years since quitting: 0.1  . Smokeless tobacco: Never Used  Substance and Sexual Activity  . Alcohol use: Not Currently  . Drug use: Never  . Sexual activity: Not on file  Other Topics Concern  . Not on file  Social History Narrative  . Not on  file   Social Determinants of Health   Financial Resource Strain:   . Difficulty of Paying Living Expenses:   Food Insecurity:   . Worried About Charity fundraiser in the Last Year:   . Arboriculturist in the Last Year:   Transportation Needs:   . Film/video editor (Medical):   Marland Kitchen Lack of Transportation (Non-Medical):   Physical Activity:   . Days of Exercise per Week:   . Minutes of Exercise per Session:   Stress:   . Feeling of Stress :   Social Connections:   . Frequency of Communication with Friends and Family:   . Frequency of Social Gatherings with Friends and Family:   . Attends Religious Services:   . Active Member of Clubs or Organizations:   . Attends Archivist Meetings:   Marland Kitchen Marital Status:   Intimate Partner Violence:   . Fear of Current or Ex-Partner:   . Emotionally Abused:   Marland Kitchen Physically Abused:   . Sexually Abused:   :  Review of Systems: Review of systems as noted in the HPI.  Unable to obtain a comprehensive review of systems due to emotional lability.  Exam: Patient Vitals for the past 24 hrs:  BP Temp Temp src Pulse Resp SpO2  05/20/19 0935 (!) 148/74 98.6 F (37 C) Oral 77 18 100 %  05/20/19 0512 (!) 159/72 98.3 F (36.8 C) Oral 73 18 100 %  05/20/19 0243 137/70 99.5 F (37.5 C) Oral 73 18 96 %  05/20/19 0214 (!) 146/68 99.1 F (37.3 C) Oral 71 18 100 %  05/19/19 2017 127/71 98.5 F (36.9 C) Oral 79 -- 94 %  05/19/19 1751 (!) 143/78 99.2 F (37.3 C) Oral 70 20 --  05/19/19 1530 (!) 147/77 -- -- 70 16 97 %   05/19/19 1515 (!) 145/42 -- -- 68 18 98 %  05/19/19 1510 (!) 142/50 -- -- 66 14 100 %  05/19/19 1505 (!) 150/53 -- -- 70 (!) 22 98 %  05/19/19 1455 (!) 147/50 -- -- 74 20 96 %    General: Chronically ill-appearing male, no distress Eyes:  no scleral icterus.   ENT:  There were no oropharyngeal lesions.   Lymphatics:  Negative cervical, supraclavicular or axillary adenopathy.   Respiratory: lungs were clear bilaterally without wheezing or crackles.   Cardiovascular:  Regular rate and rhythm, S1/S2, without murmur, rub or gallop.  There was no pedal edema.   GI:  abdomen was soft, flat, nontender, nondistended, without organomegaly.   Musculoskeletal: Reports pain to his lower extremities with light palpation Skin exam was without echymosis, petichae.   Neuro: Nonfocal.  Confused.  Emotional lability noted.   Lab Results  Component Value Date   WBC 9.6 05/20/2019   HGB 8.7 (L) 05/20/2019   HCT 26.7 (L) 05/20/2019   PLT 317 05/20/2019   GLUCOSE 153 (H) 05/20/2019   CHOL 243 (H) 03/21/2019   TRIG 186 (H) 03/28/2019   HDL 37 (L) 03/21/2019   LDLCALC 177 (H) 03/21/2019   ALT 36 05/17/2019   AST 72 (H) 05/17/2019   NA 137 05/20/2019   K 3.7 05/20/2019   CL 109 05/20/2019   CREATININE 2.44 (H) 05/20/2019   BUN 35 (H) 05/20/2019   CO2 19 (L) 05/20/2019    CT ABDOMEN PELVIS WO CONTRAST  Result Date: 05/18/2019 CLINICAL DATA:  Anemia with mild back pain. EXAM: CT ABDOMEN AND PELVIS WITHOUT CONTRAST TECHNIQUE: Multidetector CT imaging of  the abdomen and pelvis was performed following the standard protocol without IV contrast. COMPARISON:  None. FINDINGS: Lower chest: No acute abnormality. Hepatobiliary: No focal liver abnormality is seen. The gallbladder is moderately distended, without evidence of gallstones. Mild gallbladder wall thickening is seen. Very mild pericholecystic inflammation is suspected. Pancreas: Unremarkable. No pancreatic ductal dilatation or surrounding inflammatory  changes. Spleen: Normal in size without focal abnormality. Adrenals/Urinary Tract: Adrenal glands are unremarkable. Kidneys are normal in size, without renal calculi or hydronephrosis. An ill-defined 1.1 cm cyst is seen within the anterior aspect of the mid right kidney. A 1.8 cm cyst is seen within the anteromedial aspect of the mid left kidney. Mild, bilateral nonspecific perinephric inflammatory fat stranding is noted. A Foley catheter is seen within a contracted urinary bladder. Mild diffuse urinary bladder wall thickening is seen. Stomach/Bowel: Stomach is within normal limits. The appendix is not clearly identified. No evidence of bowel dilatation. Noninflamed diverticula are seen within the proximal sigmoid colon. Vascular/Lymphatic: There is mild aortic calcification. No enlarged abdominal or pelvic lymph nodes. Reproductive: Prostate is unremarkable. Other: No abdominal wall hernia or abnormality. No abdominopelvic ascites. Musculoskeletal: Degenerative changes seen throughout the lumbar spine. IMPRESSION: 1. Mild gallbladder wall thickening, without evidence of gallstones. Very mild pericholecystic inflammation is suspected. If there is concern for acute cholecystitis, a right upper quadrant ultrasound could be performed for further evaluation. 2. Bilateral renal cysts with mild, bilateral nonspecific perinephric inflammatory fat stranding. Sequelae associated with acute pyelonephritis cannot be excluded. 3. Noninflamed sigmoid diverticulosis. Aortic Atherosclerosis (ICD10-I70.0). Electronically Signed   By: Virgina Norfolk M.D.   On: 05/18/2019 16:03   DG Abd 1 View  Result Date: 05/13/2019 CLINICAL DATA:  Abdominal pain. EXAM: ABDOMEN - 1 VIEW COMPARISON:  March 23, 2019. FINDINGS: The bowel gas pattern is normal. No radio-opaque calculi or other significant radiographic abnormality are seen. IMPRESSION: Negative. Electronically Signed   By: Marijo Conception M.D.   On: 05/13/2019 16:26   CT HEAD  WO CONTRAST  Result Date: 05/08/2019 CLINICAL DATA:  69 year old male with encephalopathy. EXAM: CT HEAD WITHOUT CONTRAST TECHNIQUE: Contiguous axial images were obtained from the base of the skull through the vertex without intravenous contrast. COMPARISON:  Brain MRI 03/18/2019. CTA head and neck 03/26/2019. FINDINGS: Brain: Mild streak artifact associated with a distal ACA region aneurysm coil pack. No midline shift, ventriculomegaly, mass effect, evidence of mass lesion, intracranial hemorrhage or evidence of cortically based acute infarction. Gray-white matter differentiation is within normal limits throughout the brain. Mild for age white matter hypodensity, most pronounced in the right cerebellum. Vascular: No suspicious intracranial vascular hyperdensity. Skull: No acute osseous abnormality identified. Sinuses/Orbits: The visible paranasal sinuses have cleared since January. Tympanic cavities and mastoids remain well pneumatized. Other: No acute orbit or scalp soft tissue finding. IMPRESSION: 1. No acute intracranial abnormality. 2. Left MCA M1 and distal ACA stents with pericallosal region coil pack redemonstrated. Electronically Signed   By: Genevie Ann M.D.   On: 05/08/2019 02:30   US RENAL  Result Date: 05/08/2019 CLINICAL DATA:  Acute kidney injury EXAM: RENAL / URINARY TRACT ULTRASOUND COMPLETE COMPARISON:  March 24, 2019 FINDINGS: Right Kidney: Renal measurements: 12.5 x 5.7 x 4.2 cm = volume: 156 mL . Echogenicity within normal limits. No mass or hydronephrosis visualized. Left Kidney: Renal measurements: 12.3 x 6.1 x 4.6 cm = volume: 180 mL. Echogenicity within normal limits. No mass or hydronephrosis visualized. Bladder: The bladder is decompressed with a Foley catheter. Other: None. IMPRESSION:  No acute abnormality.  No hydronephrosis. Electronically Signed   By: Constance Holster M.D.   On: 05/08/2019 02:49   DG Chest Port 1 View  Result Date: 05/08/2019 CLINICAL DATA:  69 year old male  with pain. EXAM: PORTABLE CHEST 1 VIEW COMPARISON:  Chest radiographs 04/02/2019 and earlier. FINDINGS: Portable AP semi upright view at 2349 hours. Right PICC line has been removed since last month. Stable cardiomegaly and mediastinal contours. Lung volumes are at the upper limits of normal. Allowing for portable technique the lungs are clear. Visualized tracheal air column is within normal limits. No pneumothorax. Prior sternotomy. IMPRESSION: Stable cardiomegaly. No acute cardiopulmonary abnormality. Electronically Signed   By: Genevie Ann M.D.   On: 05/08/2019 00:15   DG Bone Survey Met  Result Date: 05/20/2019 CLINICAL DATA:  Acute renal failure. 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 No lytic or sclerotic bone lesions. No lytic myelomatous lesions are identified involving the axial or appendicular skeleton. Degenerative changes involving the spine but no bone lesions. Moderate scattered vascular calcifications. Left carotid artery stent noted. Median sternotomy wires are noted. IMPRESSION: Unremarkable bone survey. No lytic or sclerotic bone lesions are identified. Electronically Signed   By: Marijo Sanes M.D.   On: 05/20/2019 11:18   US BIOPSY (KIDNEY)  Result Date: 05/19/2019 INDICATION: Worsening renal function and need for renal biopsy. EXAM: ULTRASOUND GUIDED CORE BIOPSY OF LEFT KIDNEY MEDICATIONS: None. ANESTHESIA/SEDATION: Fentanyl 50 mcg IV; Versed 1.0 mg IV Moderate Sedation Time:  15 minutes. The patient was continuously monitored during the procedure by the interventional radiology nurse under my direct supervision. PROCEDURE: The procedure, risks, benefits, and alternatives were explained to the patient. Questions regarding the procedure were encouraged and answered. The patient understands and consents to the procedure. A time-out was performed prior to initiating the procedure.  Both kidneys were examined by ultrasound in a prone position. The left flank region was prepped with chlorhexidine in a sterile fashion, and a sterile drape was applied covering the operative field. A sterile gown and sterile gloves were used for the procedure. Local anesthesia was provided with 1% Lidocaine. Under ultrasound guidance, 2 separate 16 gauge core biopsy samples were obtained at the level of lower pole renal cortex. Core biopsy samples were submitted in saline. Additional ultrasound was performed after removing the biopsy needle. COMPLICATIONS: None immediate. FINDINGS: The left kidney was chosen for biopsy. Both were well visualized by ultrasound. Solid material was obtained at the level of the left renal cortex. IMPRESSION: Ultrasound-guided core biopsy performed of the left kidney at the level of lower pole renal cortex. Electronically Signed   By: Aletta Edouard M.D.   On: 05/19/2019 16:02     CT ABDOMEN PELVIS WO CONTRAST  Result Date: 05/18/2019 CLINICAL DATA:  Anemia with mild back pain. EXAM: CT ABDOMEN AND PELVIS WITHOUT CONTRAST TECHNIQUE: Multidetector CT imaging of the abdomen and pelvis was performed following the standard protocol without IV contrast. COMPARISON:  None. FINDINGS: Lower chest: No acute abnormality. Hepatobiliary: No focal liver abnormality is seen. The gallbladder is moderately distended, without evidence of gallstones. Mild gallbladder wall thickening is seen. Very mild pericholecystic inflammation is suspected. Pancreas: Unremarkable. No pancreatic ductal dilatation or surrounding inflammatory changes. Spleen: Normal in size without focal abnormality. Adrenals/Urinary Tract: Adrenal glands are unremarkable. Kidneys are normal in size, without renal calculi or hydronephrosis. An ill-defined 1.1 cm cyst is seen within the anterior aspect of  the mid right kidney. A 1.8 cm cyst is seen within the anteromedial aspect of the mid left kidney. Mild, bilateral nonspecific  perinephric inflammatory fat stranding is noted. A Foley catheter is seen within a contracted urinary bladder. Mild diffuse urinary bladder wall thickening is seen. Stomach/Bowel: Stomach is within normal limits. The appendix is not clearly identified. No evidence of bowel dilatation. Noninflamed diverticula are seen within the proximal sigmoid colon. Vascular/Lymphatic: There is mild aortic calcification. No enlarged abdominal or pelvic lymph nodes. Reproductive: Prostate is unremarkable. Other: No abdominal wall hernia or abnormality. No abdominopelvic ascites. Musculoskeletal: Degenerative changes seen throughout the lumbar spine. IMPRESSION: 1. Mild gallbladder wall thickening, without evidence of gallstones. Very mild pericholecystic inflammation is suspected. If there is concern for acute cholecystitis, a right upper quadrant ultrasound could be performed for further evaluation. 2. Bilateral renal cysts with mild, bilateral nonspecific perinephric inflammatory fat stranding. Sequelae associated with acute pyelonephritis cannot be excluded. 3. Noninflamed sigmoid diverticulosis. Aortic Atherosclerosis (ICD10-I70.0). Electronically Signed   By: Virgina Norfolk M.D.   On: 05/18/2019 16:03   DG Abd 1 View  Result Date: 05/13/2019 CLINICAL DATA:  Abdominal pain. EXAM: ABDOMEN - 1 VIEW COMPARISON:  March 23, 2019. FINDINGS: The bowel gas pattern is normal. No radio-opaque calculi or other significant radiographic abnormality are seen. IMPRESSION: Negative. Electronically Signed   By: Marijo Conception M.D.   On: 05/13/2019 16:26   CT HEAD WO CONTRAST  Result Date: 05/08/2019 CLINICAL DATA:  69 year old male with encephalopathy. EXAM: CT HEAD WITHOUT CONTRAST TECHNIQUE: Contiguous axial images were obtained from the base of the skull through the vertex without intravenous contrast. COMPARISON:  Brain MRI 03/18/2019. CTA head and neck 03/26/2019. FINDINGS: Brain: Mild streak artifact associated with a distal  ACA region aneurysm coil pack. No midline shift, ventriculomegaly, mass effect, evidence of mass lesion, intracranial hemorrhage or evidence of cortically based acute infarction. Gray-white matter differentiation is within normal limits throughout the brain. Mild for age white matter hypodensity, most pronounced in the right cerebellum. Vascular: No suspicious intracranial vascular hyperdensity. Skull: No acute osseous abnormality identified. Sinuses/Orbits: The visible paranasal sinuses have cleared since January. Tympanic cavities and mastoids remain well pneumatized. Other: No acute orbit or scalp soft tissue finding. IMPRESSION: 1. No acute intracranial abnormality. 2. Left MCA M1 and distal ACA stents with pericallosal region coil pack redemonstrated. Electronically Signed   By: Genevie Ann M.D.   On: 05/08/2019 02:30   US RENAL  Result Date: 05/08/2019 CLINICAL DATA:  Acute kidney injury EXAM: RENAL / URINARY TRACT ULTRASOUND COMPLETE COMPARISON:  March 24, 2019 FINDINGS: Right Kidney: Renal measurements: 12.5 x 5.7 x 4.2 cm = volume: 156 mL . Echogenicity within normal limits. No mass or hydronephrosis visualized. Left Kidney: Renal measurements: 12.3 x 6.1 x 4.6 cm = volume: 180 mL. Echogenicity within normal limits. No mass or hydronephrosis visualized. Bladder: The bladder is decompressed with a Foley catheter. Other: None. IMPRESSION: No acute abnormality.  No hydronephrosis. Electronically Signed   By: Constance Holster M.D.   On: 05/08/2019 02:49   DG Chest Port 1 View  Result Date: 05/08/2019 CLINICAL DATA:  69 year old male with pain. EXAM: PORTABLE CHEST 1 VIEW COMPARISON:  Chest radiographs 04/02/2019 and earlier. FINDINGS: Portable AP semi upright view at 2349 hours. Right PICC line has been removed since last month. Stable cardiomegaly and mediastinal contours. Lung volumes are at the upper limits of normal. Allowing for portable technique the lungs are clear. Visualized tracheal air column  is within normal limits. No pneumothorax. Prior sternotomy. IMPRESSION: Stable cardiomegaly. No acute cardiopulmonary abnormality. Electronically Signed   By: Genevie Ann M.D.   On: 05/08/2019 00:15   DG Bone Survey Met  Result Date: 05/20/2019 CLINICAL DATA:  Acute renal failure. 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 No lytic or sclerotic bone lesions. No lytic myelomatous lesions are identified involving the axial or appendicular skeleton. Degenerative changes involving the spine but no bone lesions. Moderate scattered vascular calcifications. Left carotid artery stent noted. Median sternotomy wires are noted. IMPRESSION: Unremarkable bone survey. No lytic or sclerotic bone lesions are identified. Electronically Signed   By: Marijo Sanes M.D.   On: 05/20/2019 11:18   US BIOPSY (KIDNEY)  Result Date: 05/19/2019 INDICATION: Worsening renal function and need for renal biopsy. EXAM: ULTRASOUND GUIDED CORE BIOPSY OF LEFT KIDNEY MEDICATIONS: None. ANESTHESIA/SEDATION: Fentanyl 50 mcg IV; Versed 1.0 mg IV Moderate Sedation Time:  15 minutes. The patient was continuously monitored during the procedure by the interventional radiology nurse under my direct supervision. PROCEDURE: The procedure, risks, benefits, and alternatives were explained to the patient. Questions regarding the procedure were encouraged and answered. The patient understands and consents to the procedure. A time-out was performed prior to initiating the procedure. Both kidneys were examined by ultrasound in a prone position. The left flank region was prepped with chlorhexidine in a sterile fashion, and a sterile drape was applied covering the operative field. A sterile gown and sterile gloves were used for the procedure. Local anesthesia was provided with 1% Lidocaine. Under ultrasound guidance, 2 separate 16 gauge core biopsy samples  were obtained at the level of lower pole renal cortex. Core biopsy samples were submitted in saline. Additional ultrasound was performed after removing the biopsy needle. COMPLICATIONS: None immediate. FINDINGS: The left kidney was chosen for biopsy. Both were well visualized by ultrasound. Solid material was obtained at the level of the left renal cortex. IMPRESSION: Ultrasound-guided core biopsy performed of the left kidney at the level of lower pole renal cortex. Electronically Signed   By: Aletta Edouard M.D.   On: 05/19/2019 16:02   Assessment and Plan:  1.  Elevated M spike and kappa light chains concerning for multiple myeloma -05/12/2019-M spike was 1.0, kappa free light chain to 20.7, lambda free light chain 40.9, kappa, lambda light chain ratio 5.40 2.  [facility] -Renal biopsy performed on 05/19/2019 and results are pending 3.  Anemia 4.  Complicated UTI/pyelonephritis with chronic indwelling Foley catheter due to urinary retention and BPH 5.  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  CAD 7.  Hypertension 8.  Hypothyroidism 9.  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  Diabetes  Mr. Beaubrun is a 69 year old male admitted due to abnormal labs showing AKI.  As part of his work-up he was noted to have an elevated M spike and light chains.  Renal biopsy has been obtained and is pending.  The patient has mild anemia.  He does not have hypercalcemia.  Recommendations: 1.  I briefly discussed with the patient his laboratory findings and concern for underlying multiple myeloma.  He has limited insight into this discussion.  Recommend bone marrow biopsy by interventional radiology to confirm this diagnosis. 2.  We will also await the results of his renal  biopsy, immunofixation, and UPEP. 3.  Once these results are obtained, will have a further  discussion with the patient and family members regarding treatment options.  Thank you for this referral.   Mikey Bussing, DNP, AGPCNP-BC, AOCNP  Mr. Amini was interviewed and examined.  His wife was at the bedside.  He is admitted with renal failure and anemia.  Laboratory work-up to date reveals a serum M spike and elevated free kappa light chains.  Results from a renal biopsy are pending.  We need to rule out a diagnosis of multiple myeloma.  We will obtain a serum immunofixation, metastatic bone survey, and diagnostic bone marrow biopsy.  It is possible the elevated kappa light chains is related to renal failure.  The anemia could be due to renal failure from another cause and subclinical bleeding.  I discussed the diagnostic possibility with Mr. Paris and his wife.  If the diagnosis of myeloma is confirmed we will initiate systemic therapy while he is in the hospital.  They indicated it will be easier for them to continue care at the [facility] cancer center.  We will follow up on results from the above diagnostic studies and initiate treatment if indicated.

## 2019-05-21 LAB — TYPE AND SCREEN
ABO/RH(D): O POS
Antibody Screen: NEGATIVE
Unit division: 0
Unit division: 0

## 2019-05-21 LAB — CBC
HCT: 25 % — ABNORMAL LOW (ref 39.0–52.0)
Hemoglobin: 7.9 g/dL — ABNORMAL LOW (ref 13.0–17.0)
MCH: 28.4 pg (ref 26.0–34.0)
MCHC: 31.6 g/dL (ref 30.0–36.0)
MCV: 89.9 fL (ref 80.0–100.0)
Platelets: 356 10*3/uL (ref 150–400)
RBC: 2.78 MIL/uL — ABNORMAL LOW (ref 4.22–5.81)
RDW: 17.9 % — ABNORMAL HIGH (ref 11.5–15.5)
WBC: 9.3 10*3/uL (ref 4.0–10.5)
nRBC: 0 % (ref 0.0–0.2)

## 2019-05-21 LAB — BPAM RBC
Blood Product Expiration Date: 202104202359
Blood Product Expiration Date: 202104202359
ISSUE DATE / TIME: 202103210445
ISSUE DATE / TIME: 202103230220
Unit Type and Rh: 5100
Unit Type and Rh: 5100

## 2019-05-21 LAB — HEPARIN LEVEL (UNFRACTIONATED): Heparin Unfractionated: 0.85 IU/mL — ABNORMAL HIGH (ref 0.30–0.70)

## 2019-05-21 LAB — RENAL FUNCTION PANEL
Albumin: 1.7 g/dL — ABNORMAL LOW (ref 3.5–5.0)
Anion gap: 7 (ref 5–15)
BUN: 32 mg/dL — ABNORMAL HIGH (ref 8–23)
CO2: 22 mmol/L (ref 22–32)
Calcium: 8.1 mg/dL — ABNORMAL LOW (ref 8.9–10.3)
Chloride: 108 mmol/L (ref 98–111)
Creatinine, Ser: 2.16 mg/dL — ABNORMAL HIGH (ref 0.61–1.24)
GFR calc Af Amer: 35 mL/min — ABNORMAL LOW (ref >60)
GFR calc non Af Amer: 30 mL/min — ABNORMAL LOW (ref >60)
Glucose, Bld: 98 mg/dL (ref 70–99)
Phosphorus: 4 mg/dL (ref 2.5–4.6)
Potassium: 4.7 mmol/L (ref 3.5–5.1)
Sodium: 137 mmol/L (ref 135–145)

## 2019-05-21 LAB — APTT: aPTT: 96 seconds — ABNORMAL HIGH (ref 24–36)

## 2019-05-21 MED ORDER — SODIUM CHLORIDE 0.9 % IV SOLN
INTRAVENOUS | Status: DC | PRN
  Administered 2019-05-21: 500 mL via INTRAVENOUS

## 2019-05-21 MED ORDER — PRO-STAT SUGAR FREE PO LIQD
30.0000 mL | Freq: Four times a day (QID) | ORAL | Status: DC
  Administered 2019-05-21 – 2019-05-26 (×18): 30 mL via ORAL
  Filled 2019-05-21 (×18): qty 30

## 2019-05-21 NOTE — Progress Notes (Signed)
PROGRESS NOTE    Jeremy Sherman  MVH:[id number] DOB: 1950-03-07 DOA: 05/07/2019 PCP: Imagene Riches, NP  Brief Narrative:  HPI On 05/08/2019 by Dr. Shela Leff [place] Jeremy Sherman a 69 y.o.malewith medical history significant ofCAD status post CABG, hypertension, hyperlipidemia, hypothyroidism, prediabetes, recent hospital admission for stroke,carotid artery disease status post stent on Eliquis and Brilinta, left atrial appendage thrombus presenting to the ED for evaluation of abnormal labs. Patient was called by his physician after outpatient labs revealed worsening renal failure.Patient appears confused and is not sure why he is here. He has no complaints. Denies fevers, chills, chest pain, cough, shortness of breath, nausea, vomiting, abdominal pain, diarrhea, flank pain, or dysuria. No family available at this time. Per ED provider's conversation with the patient's wife since his discharge from rehab 2 weeks ago he has had slowly worsening confusion. He has an indwelling Foley catheter since his prior hospitalization which has been draining urine.  Interim history Patient found to have acute kidney injury and nephrology consulted and following.  Also developed fever with worsening leukocytosis, work-up thus far has been unremarkable and he continues to spike intermittent temperatures with a T-max of 100.4 yesterday.  There was initial concern of CAUTI when he was first admitted however repeat urine cultures show no growth.  Patient underwent a renal biopsy on 05/19/2019 with results still pending.  Because of the elevated M spike he is undergoing a multiple myeloma work-up and will get a bone marrow biopsy in the a.m. as his bone survey was negative.  Assessment & Plan:   Principal Problem:   AKI (acute kidney injury) ([place]) Active Problems:   Essential hypertension   UTI (urinary tract infection) due to urinary indwelling catheter ([facility])   Acute metabolic  encephalopathy   QT prolongation  Acute kidney injury, improving  -Creatinine on admission 4.72 (baseline approximately 1) -BUN/creatinine is trended down is 32/2.16 -Nephrology consulted and appreciated-renal obtained out biopsy -Interventional radiology consulted and appreciated, s/p US guided biopsy of left kidney- biopsy results pending  -Renal ultrasound unremarkable -ANCA negative -Patient was placed on IV fluids which is now stopped -Continue to monitor CMP in a.m.  M spike with concern for multiple myeloma -patient noted to have M spike and pending renal biopsy results -Oncology, Dr. Benay Spice, consulted and appreciated. Recommended serum immunofixation and bone survey; -Serum immunofixation is pending -Nephrology is also ordering SPEP and free light chains as well as patient having abnormal kappa lambda free light chain ratio -Bone survey was done and showed "Unremarkable bone survey. No lytic or sclerotic bone lesions are identified." -Patient to have a bone marrow biopsy done tomorrow and Dr. Benay Spice to initiate systemic treatment for myeloma if indicated  Complicated UTI/pyelonephritis/pyuria with hematuria in a patient with chronic indwelling Foley catheter due to urinary retention and BPH -Urine culture initially unremarkable.  Repeat urine culture on 05/13/2019 show no growth  -Foley catheter was changed on 05/13/2019 -Blood cultures show no growth to date -patient was placed on IV ceftriaxone - and completed course during hospitalization -Patient continues to have fever and some vomiting and may need to resume antibiotics but will reculture if he spiked another temperature next-May need to discuss with infectious diseases  Acute metabolic encephalopathy -Suspect some underlying cognitive impairment -Multifactorial including dehydration, AKI, hyperammonemia, low B12, tramadol use -Appears to be improving, although unsure patient's baseline -patient was very emotionally  labile up to this morning  Normocytic anemia -Certainly iron deficiency anemia versus anemia of chronic disease -hemoglobin dropped  day before yesterday to 6.7 -has received 2u PRBC during hospitalization, hemoglobin up to 8.7 yesterday and today is 7.9 -Iron saturation 9, B12 142 -Was given B12 supplementation along with IV iron and ESA per nephrology; patient has received Feraheme x2 will be in the hospital -FOBT negative; urine is slightly blood-tinged -Of note, patient on Brilinta and Eliquis- will hold for renal biopsy and placed on heparin -CT abdomen pelvis made no mention of bleed  History of CVA -Left CAS/left MCA stenosis status post left CEA and left MCA stent in January 2021.  On Brilinta.  Patient also noted to have a left atrial appendage thrombus was placed on Eliquis. -Currently no focal neuro deficits other than lower extremity bilateral weakness.  Patient was discharged home from rehab on 04/24/2019. -Continue Brilinta, Eliquis, statin- as above- hold Brilinta and Eliquis  -PT, OT recommending SNF  History of coronary artery disease -Patient with CABG in 2019 -Currently stable, no complaints of chest pain  Essential hypertension -Currently on no meds, will add on meds as needed  Hypothyroidism -Continue Synthroid -TSH 6.5- however this was a hemolyzed sample  Metabolic acidosis  -Improved and his CO2 is now 22 with a chloride level of 108 and anion gap of 7  -Continue with bicarbonate 650 mg p.o. 3 times daily for now  Hyponatremia -Resolved  Hyperphosphatemia -Likely due to renal failure -Treatment per nephrology Phos is improved to 4.0 next-continue monitor and trend  Generalized weakness with debility -PT and OT as above  Chronic pain -Continue scheduled Tylenol, avoid sedating medications -Check LFTs given that he has been on scheduled Tylenol   GERD -Continue PPI  Gallbladder Thickening -CT A/P: Her wall thickening, without evidence  of gallstones. -Patient with no abdominal pain on exam  LA Thrombus/atrial appendage thrombus -Was on Eliquis PTA but held in anticipation of Renal Bx -Last dose of Eliquis was 3/17 -Transitioned to Heparin gtt and currently remains on it -Anticipating Bone Marrow Bx in AM   DVT prophylaxis: Enoxaparin 40 mg sq q24h Code Status: FULL CODE  Family Communication: No family present at bedside Disposition Plan: Patient was admitted from home for metabolic encephalopathy along with an AKI.  He underwent a renal biopsy which is still pending results.  Continues to have anemia and been given blood and for him.  He is to go under a bone marrow biopsy in the a.m. and he will likely go to SNF once medically stable cleared by nephrology as well as oncology  Consultants:   Medical Oncology  Nephrology  Interventional Radiology    Procedures:  Renal ultrasound Ultra sound guided biopsy the left kidney Patient to go under bone marrow biopsy in a.m.   Antimicrobials:  Anti-infectives (From admission, onward)   Start     Dose/Rate Route Frequency Ordered Stop   05/13/19 1045  cefTRIAXone (ROCEPHIN) 1 g in sodium chloride 0.9 % 100 mL IVPB  Status:  Discontinued     1 g 200 mL/hr over 30 Minutes Intravenous Every 24 hours 05/13/19 1036 05/18/19 1024   05/08/19 0130  cefTRIAXone (ROCEPHIN) 1 g in sodium chloride 0.9 % 100 mL IVPB  Status:  Discontinued     1 g 200 mL/hr over 30 Minutes Intravenous Daily at bedtime 05/08/19 0100 05/09/19 4270     Subjective: Patient was seen and examined at bedside and initially was calm but then became extremely tearful and distraught.  He kept saying "I do not know, I do not know."  No nausea or  vomiting.  Urine is slightly blood-tinged but not grossly hematuric.  No other concerns or complaints at this time.  Objective: Vitals:   05/21/19 0450 05/21/19 1005 05/21/19 1200 05/21/19 1715  BP: (!) 149/79 126/69 130/72 139/80  Pulse: 68 71 68 74  Resp: 20  19 18 20   Temp: 99.1 F (37.3 C) 98.7 F (37.1 C) 98.4 F (36.9 C) 98.9 F (37.2 C)  TempSrc: Oral Oral Oral Oral  SpO2: 100% 100% 97% 100%  Weight:      Height:        Intake/Output Summary (Last 24 hours) at 05/21/2019 1921 Last data filed at 05/21/2019 1816 Gross per 24 hour  Intake 430.58 ml  Output 1000 ml  Net -569.42 ml   Filed Weights   05/13/19 2059 05/17/19 2119 05/21/19 0439  Weight: 97.9 kg 99.8 kg 102.2 kg   Examination: Physical Exam:  Constitutional: WN/WD chronically ill-appearing African-American male currently in no acute distress but then became extremely distraught and tearful Eyes: Lids and conjunctivae normal, sclerae anicteric  ENMT: External Ears, Nose appear normal. Grossly normal hearing.  Neck: Appears normal, supple, no cervical masses, normal ROM, no appreciable thyromegaly; no JVD Respiratory: Diminished to auscultation bilaterally, no wheezing, rales, rhonchi or crackles. Normal respiratory effort and patient is not tachypenic. No accessory muscle use.  Unlabored breathing Cardiovascular: RRR, no murmurs / rubs / gallops. S1 and S2 auscultated. No extremity edema.  Abdomen: Soft, non-tender, non-distended. Bowel sounds positive.  GU: Deferred.  Has a Foley catheter in place with slightly blood-tinged urine Musculoskeletal: No clubbing / cyanosis of digits/nails. No joint deformity upper and lower extremities. Skin: No rashes, lesions, ulcers or limited skin evaluation. No induration; Warm and dry.  Neurologic: CN 2-12 grossly intact with no focal deficits. Romberg sign cerebellar reflexes not assessed.  Psychiatric: Normal judgment and insight. Alert and oriented x 2.  Extremely tearful and anxious  Data Reviewed: I have personally reviewed following labs and imaging studies  CBC: Recent Labs  Lab 05/17/19 0122 05/17/19 1226 05/18/19 0249 05/18/19 0249 05/18/19 0909 05/19/19 0542 05/20/19 0012 05/20/19 0807 05/21/19 0507  WBC 11.3*  --   9.9  --   --  8.6 9.6  --  9.3  HGB 7.2*   < > 6.6*   < > 8.0* 7.4* 6.7* 8.7* 7.9*  HCT 22.7*   < > 19.9*   < > 25.3* 23.3* 21.1* 26.7* 25.0*  MCV 89.7  --  86.5  --   --  90.0 88.3  --  89.9  PLT 358  --  333  --   --  312 317  --  356   < > = values in this interval not displayed.   Basic Metabolic Panel: Recent Labs  Lab 05/17/19 0122 05/18/19 0249 05/19/19 0542 05/20/19 0012 05/21/19 0507  NA 136 135 138 137 137  K 3.9 4.0 3.9 3.7 4.7  CL 107 106 106 109 108  CO2 19* 19* 21* 19* 22  GLUCOSE 90 98 94 153* 98  BUN 31* 36* 34* 35* 32*  CREATININE 2.85* 2.67* 2.45* 2.44* 2.16*  CALCIUM 8.0* 7.8* 8.2* 7.9* 8.1*  PHOS 4.3 3.8 4.2 3.6 4.0   GFR: Estimated Creatinine Clearance: 40.1 mL/min (A) (by C-G formula based on SCr of 2.16 mg/dL (H)). Liver Function Tests: Recent Labs  Lab 05/17/19 0122 05/18/19 0249 05/19/19 0542 05/20/19 0012 05/21/19 0507  AST 72*  --   --   --   --   ALT  36  --   --   --   --   ALKPHOS 247*  --   --   --   --   BILITOT 0.9  --   --   --   --   PROT 6.7  --   --   --   --   ALBUMIN 1.8* 1.6* 1.6* 1.5* 1.7*   No results for input(s): LIPASE, AMYLASE in the last 168 hours. No results for input(s): AMMONIA in the last 168 hours. Coagulation Profile: Recent Labs  Lab 05/19/19 0542  INR 1.4*   Cardiac Enzymes: No results for input(s): CKTOTAL, CKMB, CKMBINDEX, TROPONINI in the last 168 hours. BNP (last 3 results) No results for input(s): PROBNP in the last 8760 hours. HbA1C: No results for input(s): HGBA1C in the last 72 hours. CBG: No results for input(s): GLUCAP in the last 168 hours. Lipid Profile: No results for input(s): CHOL, HDL, LDLCALC, TRIG, CHOLHDL, LDLDIRECT in the last 72 hours. Thyroid Function Tests: No results for input(s): TSH, T4TOTAL, FREET4, T3FREE, THYROIDAB in the last 72 hours. Anemia Panel: No results for input(s): VITAMINB12, FOLATE, FERRITIN, TIBC, IRON, RETICCTPCT in the last 72 hours. Sepsis Labs: No  results for input(s): PROCALCITON, LATICACIDVEN in the last 168 hours.  Recent Results (from the past 240 hour(s))  Culture, blood (routine x 2)     Status: None   Collection Time: 05/13/19 10:04 AM   Specimen: BLOOD  Result Value Ref Range Status   Specimen Description BLOOD RIGHT ANTECUBITAL  Final   Special Requests   Final    AEROBIC BOTTLE ONLY Blood Culture results may not be optimal due to an inadequate volume of blood received in culture bottles   Culture   Final    NO GROWTH 5 DAYS Performed at [facility], [place] [address] [place], [place] [address]    Report Status 05/18/2019 FINAL  Final  Culture, blood (routine x 2)     Status: None   Collection Time: 05/13/19 10:30 AM   Specimen: BLOOD RIGHT ARM  Result Value Ref Range Status   Specimen Description BLOOD RIGHT ARM  Final   Special Requests   Final    AEROBIC BOTTLE ONLY Blood Culture results may not be optimal due to an inadequate volume of blood received in culture bottles   Culture   Final    NO GROWTH 5 DAYS Performed at [facility], [place] [address], [place], [place] [address]    Report Status 05/18/2019 FINAL  Final  Culture, Urine     Status: None   Collection Time: 05/13/19  6:58 PM   Specimen: Urine, Catheterized  Result Value Ref Range Status   Specimen Description URINE, CATHETERIZED  Final   Special Requests NONE  Final   Culture   Final    NO GROWTH Performed at [facility], [address], [place], [place] [address]    Report Status 05/14/2019 FINAL  Final     RN Pressure Injury Documentation:     Estimated body mass index is 26.72 kg/m as calculated from the following:   Height as of this encounter: 6' 5"  (1.956 m).   Weight as of this encounter: 102.2 kg.  Malnutrition Type:  Nutrition Problem: Inadequate oral intake Etiology: acute illness, poor appetite   Malnutrition Characteristics:  Signs/Symptoms: meal completion < 25%   Nutrition  Interventions:  Interventions: Magic cup, Refer to RD note for recommendations, Liberalize Diet, Nepro shake  Radiology Studies: DG Bone Survey Met  Result Date: 05/20/2019 CLINICAL DATA:  Acute renal failure. 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 No lytic or sclerotic bone lesions. No lytic myelomatous lesions are identified involving the axial or appendicular skeleton. Degenerative changes involving the spine but no bone lesions. Moderate scattered vascular calcifications. Left carotid artery stent noted. Median sternotomy wires are noted. IMPRESSION: Unremarkable bone survey. No lytic or sclerotic bone lesions are identified. Electronically Signed   By: Marijo Sanes M.D.   On: 05/20/2019 11:18   Scheduled Meds: . atorvastatin  80 mg Oral q1800  . Chlorhexidine Gluconate Cloth  6 each Topical Daily  . feeding supplement  1 Container Oral TID BM  . feeding supplement (PRO-STAT SUGAR FREE 64)  30 mL Oral QID  . influenza vaccine adjuvanted  0.5 mL Intramuscular Tomorrow-1000  . levothyroxine  100 mcg Oral Q0600  . pneumococcal 23 valent vaccine  0.5 mL Intramuscular Tomorrow-1000  . sodium bicarbonate  650 mg Oral TID  . vitamin B-12  1,000 mcg Oral Daily   Continuous Infusions: . heparin 700 Units/hr (05/20/19 2023)    LOS: 13 days   Kerney Elbe, DO [facility] PAGER is on [place]  If 7PM-7AM, please contact night-coverage [email or web address]

## 2019-05-21 NOTE — Progress Notes (Signed)
IP PROGRESS NOTE  Subjective:   He appears comfortable.  No complaint this morning.  Objective: Vital signs in last 24 hours: Blood pressure 130/72, pulse 68, temperature 98.4 F (36.9 C), temperature source Oral, resp. rate 18, height 6' 5"  (1.956 m), weight 225 lb 5 oz (102.2 kg), SpO2 97 %.  Intake/Output from previous day: 03/23 0701 - 03/24 0700 In: 1298.4 [P.O.:990; I.V.:71.4; NG/GT:237] Out: 1600 [Urine:1600]  Physical Exam:  Not performed today  Lab Results: Recent Labs    05/20/19 0012 05/20/19 0012 05/20/19 0807 05/21/19 0507  WBC 9.6  --   --  9.3  HGB 6.7*   < > 8.7* 7.9*  HCT 21.1*   < > 26.7* 25.0*  PLT 317  --   --  356   < > = values in this interval not displayed.   Blood smear from 05/20/2019: The platelets appear normal in number, few large platelets.  Few plasmacytoid lymphocytes, but no monotonous white cell population.  No blasts.  Numerous acanthocytes and burr cells.  Rare schistocytes.  Few ovalocytes.  Recent Labs    05/20/19 0012 05/21/19 0507  NA 137 137  K 3.7 4.7  CL 109 108  CO2 19* 22  GLUCOSE 153* 98  BUN 35* 32*  CREATININE 2.44* 2.16*  CALCIUM 7.9* 8.1*    No results found for: CEA1  Studies/Results: DG Bone Survey Met  Result Date: 05/20/2019 CLINICAL DATA:  Acute renal failure. 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 No lytic or sclerotic bone lesions. No lytic myelomatous lesions are identified involving the axial or appendicular skeleton. Degenerative changes involving the spine but no bone lesions. Moderate scattered vascular calcifications. Left carotid artery stent noted. Median sternotomy wires are noted. IMPRESSION: Unremarkable bone survey. No lytic or sclerotic bone lesions are identified. Electronically Signed   By: Marijo Sanes M.D.   On: 05/20/2019 11:18   US BIOPSY (KIDNEY)  Result Date:  05/19/2019 INDICATION: Worsening renal function and need for renal biopsy. EXAM: ULTRASOUND GUIDED CORE BIOPSY OF LEFT KIDNEY MEDICATIONS: None. ANESTHESIA/SEDATION: Fentanyl 50 mcg IV; Versed 1.0 mg IV Moderate Sedation Time:  15 minutes. The patient was continuously monitored during the procedure by the interventional radiology nurse under my direct supervision. PROCEDURE: The procedure, risks, benefits, and alternatives were explained to the patient. Questions regarding the procedure were encouraged and answered. The patient understands and consents to the procedure. A time-out was performed prior to initiating the procedure. Both kidneys were examined by ultrasound in a prone position. The left flank region was prepped with chlorhexidine in a sterile fashion, and a sterile drape was applied covering the operative field. A sterile gown and sterile gloves were used for the procedure. Local anesthesia was provided with 1% Lidocaine. Under ultrasound guidance, 2 separate 16 gauge core biopsy samples were obtained at the level of lower pole renal cortex. Core biopsy samples were submitted in saline. Additional ultrasound was performed after removing the biopsy needle. COMPLICATIONS: None immediate. FINDINGS: The left kidney was chosen for biopsy. Both were well visualized by ultrasound. Solid material was obtained at the level of the left renal cortex. IMPRESSION: Ultrasound-guided core biopsy performed of the left kidney at the level of lower pole renal cortex. Electronically Signed   By: Aletta Edouard M.D.   On: 05/19/2019 16:02    Medications: I have reviewed the patient's current medications.  Assessment/Plan:  1.  Elevated M  spike and kappa light chains concerning for multiple myeloma -05/12/2019-M spike was 1.0, kappa free light chain to 20.7, lambda free light chain 40.9, kappa, lambda light chain ratio 5.40 -Elevated urine free kappa light chains -Negative metastatic bone survey 05/20/2019  2.   AKI -Renal biopsy performed on 05/19/2019 and results are pending 3.  Anemia 4.  Complicated UTI/pyelonephritis with chronic indwelling Foley catheter due to urinary retention and BPH 5.  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  CAD 7.  Hypertension 8.  Hypothyroidism 9.  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  Diabetes  Mr. Nevills appears unchanged.  He has elevated serum and urine kappa light chains with a serum M spike.  Serum immunofixation is pending.  He will undergo a diagnostic bone marrow biopsy today.  We will follow-up on the bone marrow biopsy result and initiate systemic treatment for myeloma if indicated.   LOS: 13 days   Betsy Coder, MD   05/21/2019, 2:37 PM

## 2019-05-21 NOTE — Progress Notes (Addendum)
ANTICOAGULATION CONSULT NOTE - Follow Up Consult  Pharmacy Consult for Heparin (Eliquis on hold) Indication: LA thrombus 03/20/19  No Known Allergies  Patient Measurements: Height: _0  (195.6 cm) Weight: 225 lb 5 oz (102.2 kg) IBW/kg (Calculated) : 89.1 Heparin Dosing Weight: 98 kg  Vital Signs: Temp: 98.7 F (37.1 C) (03/24 1005) Temp Source: Oral (03/24 1005) BP: 126/69 (03/24 1005) Pulse Rate: 71 (03/24 1005)  Labs: Recent Labs    05/19/19 0542 05/19/19 0542 05/20/19 0012 05/20/19 0012 05/20/19 0807 05/20/19 1836 05/21/19 0507  HGB 7.4*   < > 6.7*   < > 8.7*  --  7.9*  HCT 23.3*   < > 21.1*  --  26.7*  --  25.0*  PLT 312  --  317  --   --   --  356  APTT 100*   < > 91*  --   --  65* 96*  LABPROT 17.1*  --   --   --   --   --   --   INR 1.4*  --   --   --   --   --   --   HEPARINUNFRC 1.68*  --  1.22*  --   --   --  0.85*  CREATININE 2.45*  --  2.44*  --   --   --  2.16*   < > = values in this interval not displayed.    Estimated Creatinine Clearance: 40.1 mL/min (A) (by C-G formula based on SCr of 2.16 mg/dL (H)).  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 Pharmacy consulted to begin heparin while Eliquis held in anticipation of renal biopsy. Last dose of Eliquis was on 3/17 PM.  Using aPTT for heparin monitoring while heparin levels are falsely elevated due to recent Eliquis doses. CT negative for abdominal bleed on 05/18/19.     Heparin held for renal biopsy on 3/22 then resumed post-procedure.     APTT is therapeutic (96 seconds) on 700 units/hr. Heparin level 0.85, still falsely elevated from recent Eliquis doses.    Hgb trended down some again, no bleeding reported.     Bone marrow biopsy planned for 3/25.  Spoke with Gareth Eagle, PA-C from IR regarding holding heparin prior to procedure. She reports that radiologist will contact floor in am.  Goal of Therapy:   Heparin level 0.3-0.7 units/ml aPTT 66-102 seconds Monitor platelets by anticoagulation protocol: Yes   Plan:   Continue heparin drip at 700 units/hr  Daily aPTT and heparin level until correlating; daily CBC.  Monitor for s/sx bleeding.  Eliquis and Brilinta on hold for procedures.  Will follow up for holding and resuming anticoagulation for bone marrow biopsy on 3/25.   Arty Baumgartner, [place] Phone: [phone] 05/21/2019,11:50 AM

## 2019-05-21 NOTE — Progress Notes (Signed)
[facility] Progress Note    Assessment/ Plan:   Pt is a 69 y.o. yo male with HTN, CAD who was admitted on 05/07/2019 with AKI (crt 1.08 on 04/21/19)    1. AKI-  True AKI from normal crt on 04/21/19.  Left hospital with indwelling foley-  Has stayed in place and changed out per home health.  Many new meds per cardiology in setting of recent CVA including brilinta and a statin.  U/A showing 100 of prot, white and red blood cells- possible UTI but no growth on culture.  Renal u/s normal with 12 cm kidneys.  Is non oliguric. BP has not been low.  CK 78.  ANA negative, complements WNL and ANCA negative. Protein to crt ratio 0.3 g  Will do SPEP/ free light chains--> M-spike 1.0 and abnormal K/L FLC ratio. Has not had + urine culture in 2021.  S/p renal biopsy 3/22, should have prelim 3/24 PM.  Will addend note if returns. Thankfully renal function has improved and Cr now 2.1. 2. + M-spike- onc has been consulted.  Bone marrow biopsy pending. 3.  Fever/ vomiting: Urine, blood cultures unrevealing, AXR unrevealing; off abx for now. Per primary.  4. HTN/volume-  s/p IVFs, po intake for now/  Normotensive. 5 Anemia-  Received feraheme x 2 while here.  Hb 7.9 this AM.  Was transfused during admission. Has been on heparin gtt for procedures and CT negative for RP bleed on 3/21.  Urine is blood tinged after renal biopsy but not grossly bloody. 6 Metabolic acidosis - on oral bicarb - improved 7.  Recent CVA: holding brilinta and Eliquis for procedures, on hep gtt 8.  Dispo: Pending biopsies -  prelim results Wed most likely.   Subjective:    He has no complaints.  Unsure if he had bone marrow biopsy and doesn't recall seeing onc yesterday.  UOP 1600 mL yetserday.  Objective:   BP 126/69 (BP Location: Right Arm)   Pulse 71   Temp 98.7 F (37.1 C) (Oral)   Resp 19   Ht 6' 5"  (1.956 m)   Wt 102.2 kg   SpO2 100%   BMI 26.72 kg/m   Intake/Output Summary (Last 24 hours) at 05/21/2019  1217 Last data filed at 05/21/2019 7062 Gross per 24 hour  Intake 1178.36 ml  Output 1275 ml  Net -96.64 ml   Weight change:   Physical Exam: Gen: sitting in bed eating lunch, NAD CVS: RRR Resp: clear Abd: soft, nontender BJS:EGBTD LE edema, very ttp c/w yesterday  Imaging: DG Bone Survey Met  Result Date: 05/20/2019 CLINICAL DATA:  Acute renal failure. 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 No lytic or sclerotic bone lesions. No lytic myelomatous lesions are identified involving the axial or appendicular skeleton. Degenerative changes involving the spine but no bone lesions. Moderate scattered vascular calcifications. Left carotid artery stent noted. Median sternotomy wires are noted. IMPRESSION: Unremarkable bone survey. No lytic or sclerotic bone lesions are identified. Electronically Signed   By: Marijo Sanes M.D.   On: 05/20/2019 11:18   US BIOPSY (KIDNEY)  Result Date: 05/19/2019 INDICATION: Worsening renal function and need for renal biopsy. EXAM: ULTRASOUND GUIDED CORE BIOPSY OF LEFT KIDNEY MEDICATIONS: None. ANESTHESIA/SEDATION: Fentanyl 50 mcg IV; Versed 1.0 mg IV Moderate Sedation Time:  15 minutes. The patient was continuously monitored during the procedure by the interventional radiology nurse under my direct supervision.  PROCEDURE: The procedure, risks, benefits, and alternatives were explained to the patient. Questions regarding the procedure were encouraged and answered. The patient understands and consents to the procedure. A time-out was performed prior to initiating the procedure. Both kidneys were examined by ultrasound in a prone position. The left flank region was prepped with chlorhexidine in a sterile fashion, and a sterile drape was applied covering the operative field. A sterile gown and sterile gloves were used for the procedure. Local anesthesia was  provided with 1% Lidocaine. Under ultrasound guidance, 2 separate 16 gauge core biopsy samples were obtained at the level of lower pole renal cortex. Core biopsy samples were submitted in saline. Additional ultrasound was performed after removing the biopsy needle. COMPLICATIONS: None immediate. FINDINGS: The left kidney was chosen for biopsy. Both were well visualized by ultrasound. Solid material was obtained at the level of the left renal cortex. IMPRESSION: Ultrasound-guided core biopsy performed of the left kidney at the level of lower pole renal cortex. Electronically Signed   By: Aletta Edouard M.D.   On: 05/19/2019 16:02    Labs: BMET Recent Labs  Lab 05/15/19 0320 05/16/19 0347 05/17/19 0122 05/18/19 0249 05/19/19 0542 05/20/19 0012 05/21/19 0507  NA 136 137 136 135 138 137 137  K 4.1 4.0 3.9 4.0 3.9 3.7 4.7  CL 107 106 107 106 106 109 108  CO2 18* 17* 19* 19* 21* 19* 22  GLUCOSE 113* 85 90 98 94 153* 98  BUN 32* 31* 31* 36* 34* 35* 32*  CREATININE 2.70* 2.80* 2.85* 2.67* 2.45* 2.44* 2.16*  CALCIUM 7.7* 8.0* 8.0* 7.8* 8.2* 7.9* 8.1*  PHOS 4.1 3.7 4.3 3.8 4.2 3.6 4.0   CBC Recent Labs  Lab 05/18/19 0249 05/18/19 0909 05/19/19 0542 05/20/19 0012 05/20/19 0807 05/21/19 0507  WBC 9.9  --  8.6 9.6  --  9.3  HGB 6.6*   < > 7.4* 6.7* 8.7* 7.9*  HCT 19.9*   < > 23.3* 21.1* 26.7* 25.0*  MCV 86.5  --  90.0 88.3  --  89.9  PLT 333  --  312 317  --  356   < > = values in this interval not displayed.    Medications:    . atorvastatin  80 mg Oral q1800  . Chlorhexidine Gluconate Cloth  6 each Topical Daily  . feeding supplement  1 Container Oral TID BM  . feeding supplement (PRO-STAT SUGAR FREE 64)  30 mL Oral TID WC  . influenza vaccine adjuvanted  0.5 mL Intramuscular Tomorrow-1000  . levothyroxine  100 mcg Oral Q0600  . pneumococcal 23 valent vaccine  0.5 mL Intramuscular Tomorrow-1000  . sodium bicarbonate  650 mg Oral TID  . vitamin B-12  1,000 mcg Oral Daily       Madelon Lips, MD

## 2019-05-21 NOTE — Plan of Care (Signed)
  Problem: Activity: Goal: Activity intolerance will improve Outcome: Progressing   

## 2019-05-21 NOTE — Progress Notes (Signed)
Occupational Therapy Treatment Patient Details Name: [facility] MRN: [id number] DOB: January 27, 1951 Today's Date: 05/21/2019    History of present illness 68 year old male admitted to ED on 3/10 with AKI, AMS, R chest wall pain. Pt with foley catheter use x2 weeks. Pt with recent hospitalization for CVA and aneurysm repair, d/c from CIR on 04/21/2019. 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  Pt's Eliquis and Brilinta on hold for procedure, she has been placed on heparin.  Per pharmacy note, goal of aPTT 66-102 - currently 91.   OT comments  Patient very fatigued  On arrival and initially refusing therapy, though then agreed he would like to do oral care.  Required mod assist with supine <> sit and was having pain in B LE with movement.  Able to sit EOB and maintain balance to complete grooming.  Required min assist with sequencing and fine motor manipulation for oral care.  He was crying during majority of session.  Therapist offered comfort and encouragement.  Will continue to follow with OT acutely to address the deficits listed below.     Follow Up Recommendations  SNF;Supervision/Assistance - 24 hour    Equipment Recommendations       Recommendations for Other Services      Precautions / Restrictions Precautions Precautions: Fall Precaution Comments: crying outbursts Restrictions Weight Bearing Restrictions: No       Mobility Bed Mobility Overal bed mobility: Needs Assistance Bed Mobility: Supine to Sit;Sit to Supine     Supine to sit: Mod assist;HOB elevated Sit to supine: Mod assist;HOB elevated   General bed mobility comments: LE in lots of pain, required assist moving them out of bed  Transfers Overall transfer level: Needs  assistance               General transfer comment: Did not attempt today    Balance Overall balance assessment: Needs assistance Sitting-balance support: Feet supported;No upper extremity supported Sitting balance-Leahy Scale: Good                                     ADL either performed or assessed with clinical judgement   ADL Overall ADL's : Needs assistance/impaired     Grooming: Wash/dry hands;Wash/dry face;Oral care;Minimal assistance;Sitting Grooming Details (indicate cue type and reason): Cueing for steps and assistance opening toothpaste top                             Functional mobility during ADLs: Moderate assistance       Vision       Perception     Praxis      Cognition Arousal/Alertness: Awake/alert Behavior During Therapy: Restless Overall Cognitive Status: No family/caregiver present to determine baseline cognitive functioning Area of Impairment: Orientation;Memory;Safety/judgement;Attention;Problem solving                 Orientation Level: Disoriented to;Time;Situation;Place Current Attention Level: Sustained Memory: Decreased short-term memory;Decreased recall of precautions Following Commands: Follows one step commands with increased time;Follows one step commands consistently Safety/Judgement: Decreased awareness of safety;Decreased awareness of deficits Awareness: Intellectual Problem Solving: Slow processing;Decreased initiation;Requires verbal cues;Difficulty sequencing;Requires tactile cues General Comments: Patient crying for most of session for varying reasons, some unknown.  Began crying because he does not have teeth.  Very confused and did not want to get out of bed        Exercises     Shoulder Instructions       General Comments      Pertinent Vitals/ Pain       Pain Assessment: Faces Faces Pain Scale: Hurts even more Pain Location: Bilateral feet Pain Descriptors / Indicators:  Sore;Guarding;Grimacing Pain Intervention(s): Limited activity within patient's tolerance;Premedicated before session;Repositioned;Relaxation  Home Living                                          Prior Functioning/Environment              Frequency  Min 2X/week        Progress Toward Goals  OT Goals(current goals can now be found in the care plan section)  Progress towards OT goals: Progressing toward goals  Acute Rehab OT Goals Patient Stated Goal: get home with wife OT Goal Formulation: With patient/family Time For Goal Achievement: 05/23/19 Potential to Achieve Goals: Good  Plan Discharge plan remains appropriate    Co-evaluation                 AM-PAC OT "6 Clicks" Daily Activity     Outcome Measure   Help from another person eating meals?: A Little Help from another person taking care of personal grooming?: A Little Help from another person toileting, which includes using toliet, bedpan, or urinal?: A Lot Help from another person bathing (including washing, rinsing, drying)?: A Lot Help from another person to put on and taking off regular upper body clothing?: A Little Help from another person to put on and taking off regular lower body clothing?: A Lot 6 Click Score: 15    End of Session    OT Visit Diagnosis: Unsteadiness on feet (R26.81);Other abnormalities of gait and mobility (R26.89);Muscle weakness (generalized) (M62.81);Ataxia, unspecified (R27.0);Other symptoms and signs involving cognitive function;Pain Pain - Right/Left: (both) Pain - part of body: Leg;Ankle and joints of foot   Activity Tolerance Other (comment);Patient limited by fatigue(limited by confusion)   Patient Left in bed;with bed alarm set;with call bell/phone within reach   Nurse Communication Mobility status        Time: [id number] OT Time Calculation (min): 23 min  Charges: OT General Charges $OT Visit: 1 Visit OT Treatments $Self Care/Home  Management : 23-37 mins  [facility], OTR/L   [facility] 05/21/2019, 2:16 PM

## 2019-05-22 ENCOUNTER — Inpatient Hospital Stay (HOSPITAL_COMMUNITY): Payer: Medicare Other

## 2019-05-22 ENCOUNTER — Other Ambulatory Visit: Payer: Self-pay

## 2019-05-22 LAB — CBC WITH DIFFERENTIAL/PLATELET
Abs Immature Granulocytes: 0.03 10*3/uL (ref 0.00–0.07)
Basophils Absolute: 0 10*3/uL (ref 0.0–0.1)
Basophils Relative: 1 %
Eosinophils Absolute: 0.2 10*3/uL (ref 0.0–0.5)
Eosinophils Relative: 2 %
HCT: 24.3 % — ABNORMAL LOW (ref 39.0–52.0)
Hemoglobin: 7.5 g/dL — ABNORMAL LOW (ref 13.0–17.0)
Immature Granulocytes: 0 %
Lymphocytes Relative: 24 %
Lymphs Abs: 1.8 10*3/uL (ref 0.7–4.0)
MCH: 27.6 pg (ref 26.0–34.0)
MCHC: 30.9 g/dL (ref 30.0–36.0)
MCV: 89.3 fL (ref 80.0–100.0)
Monocytes Absolute: 0.7 10*3/uL (ref 0.1–1.0)
Monocytes Relative: 9 %
Neutro Abs: 4.8 10*3/uL (ref 1.7–7.7)
Neutrophils Relative %: 64 %
Platelets: 356 10*3/uL (ref 150–400)
RBC: 2.72 MIL/uL — ABNORMAL LOW (ref 4.22–5.81)
RDW: 17.9 % — ABNORMAL HIGH (ref 11.5–15.5)
WBC: 7.6 10*3/uL (ref 4.0–10.5)
nRBC: 0 % (ref 0.0–0.2)

## 2019-05-22 LAB — HEPARIN LEVEL (UNFRACTIONATED)
Heparin Unfractionated: 0.58 IU/mL (ref 0.30–0.70)
Heparin Unfractionated: 0.38 IU/mL (ref 0.30–0.70)

## 2019-05-22 LAB — COMPREHENSIVE METABOLIC PANEL
ALT: 45 U/L — ABNORMAL HIGH (ref 0–44)
AST: 61 U/L — ABNORMAL HIGH (ref 15–41)
Albumin: 1.7 g/dL — ABNORMAL LOW (ref 3.5–5.0)
Alkaline Phosphatase: 246 U/L — ABNORMAL HIGH (ref 38–126)
Anion gap: 10 (ref 5–15)
BUN: 31 mg/dL — ABNORMAL HIGH (ref 8–23)
CO2: 21 mmol/L — ABNORMAL LOW (ref 22–32)
Calcium: 8 mg/dL — ABNORMAL LOW (ref 8.9–10.3)
Chloride: 106 mmol/L (ref 98–111)
Creatinine, Ser: 1.94 mg/dL — ABNORMAL HIGH (ref 0.61–1.24)
GFR calc Af Amer: 39 mL/min — ABNORMAL LOW (ref >60)
GFR calc non Af Amer: 34 mL/min — ABNORMAL LOW (ref >60)
Glucose, Bld: 89 mg/dL (ref 70–99)
Potassium: 4.2 mmol/L (ref 3.5–5.1)
Sodium: 137 mmol/L (ref 135–145)
Total Bilirubin: 0.8 mg/dL (ref 0.3–1.2)
Total Protein: 6.4 g/dL — ABNORMAL LOW (ref 6.5–8.1)

## 2019-05-22 LAB — MAGNESIUM: Magnesium: 2.2 mg/dL (ref 1.7–2.4)

## 2019-05-22 LAB — PHOSPHORUS: Phosphorus: 3.4 mg/dL (ref 2.5–4.6)

## 2019-05-22 LAB — APTT: aPTT: 106 seconds — ABNORMAL HIGH (ref 24–36)

## 2019-05-22 MED ORDER — MIDAZOLAM HCL 2 MG/2ML IJ SOLN
INTRAMUSCULAR | Status: AC
  Filled 2019-05-22: qty 2

## 2019-05-22 MED ORDER — FENTANYL CITRATE (PF) 100 MCG/2ML IJ SOLN
INTRAMUSCULAR | Status: AC
  Filled 2019-05-22: qty 2

## 2019-05-22 MED ORDER — ENSURE ENLIVE PO LIQD
237.0000 mL | Freq: Two times a day (BID) | ORAL | Status: DC
  Administered 2019-05-22 – 2019-05-26 (×6): 237 mL via ORAL

## 2019-05-22 MED ORDER — HEPARIN (PORCINE) 25000 UT/250ML-% IV SOLN
700.0000 [IU]/h | INTRAVENOUS | Status: DC
  Administered 2019-05-22 – 2019-05-23 (×2): 700 [IU]/h via INTRAVENOUS
  Filled 2019-05-22: qty 250

## 2019-05-22 MED ORDER — MIDAZOLAM HCL 2 MG/2ML IJ SOLN
INTRAMUSCULAR | Status: AC | PRN
  Administered 2019-05-22 (×2): 1 mg via INTRAVENOUS

## 2019-05-22 MED ORDER — FENTANYL CITRATE (PF) 100 MCG/2ML IJ SOLN
INTRAMUSCULAR | Status: AC | PRN
  Administered 2019-05-22 (×2): 50 ug via INTRAVENOUS

## 2019-05-22 NOTE — Progress Notes (Addendum)
IP PROGRESS NOTE  Subjective:   He appears comfortable.  No complaints today.  No family at the bedside.  Objective: Vital signs in last 24 hours: Blood pressure (!) 141/73, pulse 63, temperature 98.6 F (37 C), temperature source Oral, resp. rate 20, height 6' 5" (1.956 m), weight 103.2 kg, SpO2 94 %.  Intake/Output from previous day: 03/24 0701 - 03/25 0700 In: 451.7 [P.O.:200; I.V.:251.7] Out: 1125 [Urine:1125]  Physical Exam:  General: Awake and alert, no distress Cardiovascular: Regular rate and rhythm, trace bilateral lower extremity edema Lungs: Clear to auscultation bilaterally  Lab Results: Recent Labs    05/21/19 0507 05/22/19 0338  WBC 9.3 7.6  HGB 7.9* 7.5*  HCT 25.0* 24.3*  PLT 356 356   Blood smear from 05/20/2019: The platelets appear normal in number, few large platelets.  Few plasmacytoid lymphocytes, but no monotonous white cell population.  No blasts.  Numerous acanthocytes and burr cells.  Rare schistocytes.  Few ovalocytes.  Recent Labs    05/21/19 0507 05/22/19 0338  NA 137 137  K 4.7 4.2  CL 108 106  CO2 22 21*  GLUCOSE 98 89  BUN 32* 31*  CREATININE 2.16* 1.94*  CALCIUM 8.1* 8.0*    No results found for: CEA1  Studies/Results: No results found.  Medications: I have reviewed the patient's current medications.  Assessment/Plan:  1.  Elevated M spike and kappa light chains concerning for multiple myeloma -05/12/2019-M spike was 1.0, kappa free light chain to 20.7, lambda free light chain 40.9, kappa, lambda light chain ratio 5.40 -Elevated urine free kappa light chains -Negative metastatic bone survey 05/20/2019  2.  AKI -Renal biopsy performed on 05/19/2019 and results are pending 3.  Anemia 4.  Complicated UTI/pyelonephritis with chronic indwelling Foley catheter due to urinary retention and BPH 5.  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  CAD 7.  Hypertension 8.  Hypothyroidism 9.  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  Diabetes  Mr. Sayas appears unchanged.  He has elevated serum and urine kappa light chains with a serum M spike.  Serum immunofixation is pending.  H status post bone marrow biopsy earlier today with preliminary report of 13% plasma cells which is not definitive diagnosis of multiple myeloma, but pathologist will review the case in detail tomorrow.  We will consider him for chemotherapy pending these results.  Recommendations: 1.  Await bone marrow biopsy results.   LOS: 14 days   Kristin Curcio, NP   05/22/2019, 3:06 PM I saw him earlier this morning.  He was tearful and asking for his wife.  Results from the bone marrow biopsy are pending.  The renal biopsy does not suggest renal involvement with light chains.  Serum immunofixation is pending.  The renal function is improving.  I will follow up on the final bone marrow biopsy pathology tomorrow to decide whether he has multiple myeloma versus an MGUS.  The etiology of the fever is unclear, but this could be tumor fever if he has myeloma. Brad Sherrill, MD 

## 2019-05-22 NOTE — Plan of Care (Signed)
  Problem: Education: Goal: Knowledge of General Education information will improve Description Including pain rating scale, medication(s)/side effects and non-pharmacologic comfort measures Outcome: Progressing   

## 2019-05-22 NOTE — Progress Notes (Addendum)
Nutrition Follow-up  DOCUMENTATION CODES:   Not applicable  INTERVENTION:   30 ml Prostat QID, each supplement provides 100 kcals and 15 grams protein.   Ensure Enlive po BID, each supplement provides 350 kcal and 20 grams of protein   NUTRITION DIAGNOSIS:   Inadequate oral intake related to acute illness, poor appetite as evidenced by meal completion < 25%.  Being addressed via supplements  GOAL:   Patient will meet greater than or equal to 90% of their needs  Progressing  MONITOR:   PO intake, Supplement acceptance, Labs, Weight trends  REASON FOR ASSESSMENT:   Consult Assessment of nutrition requirement/status  ASSESSMENT:   69 yo male admitted with AKI with metabolic acidosis, acute metabolic encephalopathy. PMH includes CAD s/p CABG,HTN, pre-DM, CVA, HLD,current tobacco user/smoker    3/25 CT bone marrow biopsy due to concern for multiple myeloma  Pt reports appetite is good, however not eating all of his meals as he doesn't like the food. Noted pt ate 25% at breakfast yesterday and 10% at lunch. NPO this AM for procedure.  Pt reports he likes the Pro-Stat supplements and takes those but does not like the [employer] or the [employer]. Pt previously indicating that he did not like the Nepro shakes  Creatinine improving  Pt denies any chewing or swallowing difficulty with the meals he is receiving.   Labs: Creatinine 1.94 Meds: sodium bicarb    Diet Order:   Diet Order            Diet NPO time specified Except for: Sips with Meds  Diet effective midnight              EDUCATION NEEDS:   Not appropriate for education at this time  Skin:  Skin Assessment: Skin Integrity Issues: Skin Integrity Issues:: Other (Comment) Other: non-pressure wound on buttock  Last BM:  3/25  Height:   Ht Readings from Last 1 Encounters:  05/10/19 6' 5" (1.956 m)    Weight:   Wt Readings from Last 1 Encounters:  05/21/19 103.2 kg    Ideal Body Weight:      BMI:  Body mass index is 26.98 kg/m.  Estimated Nutritional Needs:   Kcal:  2400-2700 kcals  Protein:  120-135 g  Fluid:  >/= 2 L   Kerman Passey MS, RDN, LDN, CNSC RD Pager Number and Weekend/On-Call After Hours Pager Located in [place]

## 2019-05-22 NOTE — Progress Notes (Signed)
ANTICOAGULATION CONSULT NOTE - Follow Up Consult  Pharmacy Consult for Heparin (Eliquis on hold) Indication: LA thrombus 03/20/19  No Known Allergies  Patient Measurements: Height: 6' 5"  (195.6 cm) Weight: 227 lb 8.2 oz (103.2 kg) IBW/kg (Calculated) : 89.1 Heparin Dosing Weight: 98 kg  Vital Signs: Temp: 98.6 F (37 C) (03/25 1115) Temp Source: Oral (03/25 1115) BP: 141/73 (03/25 1115) Pulse Rate: 63 (03/25 1115)  Labs: Recent Labs    05/20/19 0012 05/20/19 0012 05/20/19 0807 05/20/19 0807 05/20/19 1836 05/21/19 0507 05/22/19 0338  HGB 6.7*   < > 8.7*   < >  --  7.9* 7.5*  HCT 21.1*   < > 26.7*  --   --  25.0* 24.3*  PLT 317  --   --   --   --  356 356  APTT 91*   < >  --   --  65* 96* 106*  HEPARINUNFRC 1.22*  --   --   --   --  0.85* 0.58  CREATININE 2.44*  --   --   --   --  2.16* 1.94*   < > = values in this interval not displayed.    Estimated Creatinine Clearance: 44.7 mL/min (A) (by C-G formula based on SCr of 1.94 mg/dL (H)).  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 Pharmacy consulted to begin heparin while Eliquis held in anticipation of renal biopsy. Last dose of Eliquis was on 3/17 PM.  Using aPTT for heparin monitoring while heparin levels are falsely elevated due to recent Eliquis doses. CT negative for abdominal bleed on 05/18/19.     Heparin held for renal biopsy on 3/22 then resumed post-procedure.     APTT just above target range (106 seconds) on 700 units/hr. Heparin level 0.58, now therapeutic; previously falsely elevated from recent Eliquis doses. Hgb trended down some again, no bleeding reported.     S/p bone marrow biopsy this morning. Heparin held on call to procedure and to resume 4 hours post-procedure per low risk guidelines..  Goal of Therapy:  Heparin level 0.3-0.7 units/ml aPTT 66-102 seconds Monitor platelets by anticoagulation protocol:  Yes   Plan:   Resume heparin drip at 700 units/hr at 2pm.  Heparin level tonight ~8 hrs after resuming.  Daily heparin level and CBC.  Cancel daily aPTTs.  Monitor for s/sx bleeding.  Eliquis and Brilinta on hold for procedures.   Arty Baumgartner, [place] Phone: [phone] 05/22/2019,11:17 AM

## 2019-05-22 NOTE — Progress Notes (Signed)
PROGRESS NOTE    Jeremy Sherman  KAJ:[id number] DOB: 05/18/1950 DOA: 05/07/2019 PCP: Imagene Riches, NP  Brief Narrative:  HPI On 05/08/2019 by Dr. Shela Leff Jeremy Sherman a 69 y.o.malewith medical history significant ofCAD status post CABG, hypertension, hyperlipidemia, hypothyroidism, prediabetes, recent hospital admission for stroke,carotid artery disease status post stent on Eliquis and Brilinta, left atrial appendage thrombus presenting to the ED for evaluation of abnormal labs. Patient was called by his physician after outpatient labs revealed worsening renal failure.Patient appears confused and is not sure why he is here. He has no complaints. Denies fevers, chills, chest pain, cough, shortness of breath, nausea, vomiting, abdominal pain, diarrhea, flank pain, or dysuria. No family available at this time. Per ED provider's conversation with the patient's wife since his discharge from rehab 2 weeks ago he has had slowly worsening confusion. He has an indwelling Foley catheter since his prior hospitalization which has been draining urine.  Interim history Patient found to have acute kidney injury and nephrology consulted and following.  Also developed fever with worsening leukocytosis, work-up thus far has been unremarkable and he continues to spike intermittent temperatures with a T-max of 100.4 yesterday.  There was initial concern of CAUTI when he was first admitted however repeat urine cultures show no growth.  Patient underwent a renal biopsy on 05/19/2019 with results still pending.  Because of the elevated M spike he is undergoing a multiple myeloma work-up and received a bone marrow biopsy this morning given that his bone survey is negative  Assessment & Plan:   Principal Problem:   AKI (acute kidney injury) ([place]) Active Problems:   Essential hypertension   UTI (urinary tract infection) due to urinary indwelling catheter (HCC)   Acute  metabolic encephalopathy   QT prolongation  Acute kidney injury, improving  -Creatinine on admission 4.72 (baseline approximately 1) -BUN/creatinine is trended down is 31/1.94 -Nephrology consulted and appreciated-renal obtained out biopsy -Interventional radiology consulted and appreciated, s/p US guided biopsy of left kidney- biopsy results pending  -Renal ultrasound unremarkable -ANCA negative -Patient was placed on IV fluids which is now stopped -Renal biopsy preliminary showed severe stenosing arteriosclerosis and chronic active interstitial nephritis -Dr. Gwyneth Revels thinks that this could be PPI induced Intersitial Nephritis -Dr. Johnney Ou has placed a call to [facility] nephro pathology for additional information about possibly starting the patient on steroids -Continue to monitor CMP in a.m.  M spike with concern for multiple myeloma -patient noted to have M spike and pending renal biopsy results -Oncology, Dr. Benay Spice, consulted and appreciated. Recommended serum immunofixation and bone survey; -Serum immunofixation is pending -Nephrology is also ordering SPEP and free light chains as well as patient having abnormal kappa lambda free light chain ratio -Bone survey was done and showed "Unremarkable bone survey. No lytic or sclerotic bone lesions are identified." -Patient underwent a bone marrow biopsy today and Dr. Benay Spice reviewed this and preliminary report showed 30% plasma cells which is a not a definitive diagnosis of multiple myeloma but will review with the pathologist in detail tomorrow and to initiate systemic treatment for myeloma if indicated  Complicated UTI/pyelonephritis/pyuria with hematuria in a patient with chronic indwelling Foley catheter due to urinary retention and BPH -Urine culture initially unremarkable.  Repeat urine culture on 05/13/2019 show no growth  -Foley catheter was changed on 05/13/2019 -Blood cultures show no growth to date -patient was placed on IV  ceftriaxone - and completed course during hospitalization -Patient continues to have fever and some vomiting and  may need to resume antibiotics but will reculture if he spiked another temperature next-May need to discuss with infectious diseases  Acute metabolic encephalopathy, continues to be somewhat confused -Suspect some underlying cognitive impairment -Multifactorial including dehydration, AKI, hyperammonemia, low B12, tramadol use -Appears to be improving, although unsure patient's baseline -patient was very emotionally labile yesterday morning but today he was pleasant and calm  Normocytic anemia -Certainly iron deficiency anemia versus anemia of chronic disease -hemoglobin dropped day before yesterday to 6.7 -has received 2u PRBC during hospitalization; now hemoglobin/hematocrit trended up to 8.7/20.7 and dropped down to 10.5/24.3 today -Iron saturation 9, B12 142 -Was given B12 supplementation along with IV iron and ESA per nephrology; patient has received Feraheme x2 will be in the hospital -FOBT negative; urine is slightly blood-tinged -Of note, patient on Brilinta and Eliquis- will hold for renal biopsy and placed on heparin -CT abdomen pelvis made no mention of bleed -continue monitor blood count carefully and repeat CBC in a.m.  History of CVA -Left CAS/left MCA stenosis status post left CEA and left MCA stent in January 2021.  On Brilinta.  Patient also noted to have a left atrial appendage thrombus was placed on Eliquis. -Currently no focal neuro deficits other than lower extremity bilateral weakness.  Patient was discharged home from rehab on 04/24/2019. -Continue Brilinta, Eliquis, statin- as above- hold Brilinta and Eliquis  -PT, OT recommending SNF  History of coronary artery disease -Patient with CABG in 2019 -Currently stable, no complaints of chest pain  Essential hypertension -Currently on no meds, will add on meds as needed  Hypothyroidism -Continue  Synthroid -TSH 6.5- however this was a hemolyzed sample  Metabolic acidosis  -Improved and his CO2 is now 22 with a chloride level of 108 and anion gap of 7  -Continue with bicarbonate 650 mg p.o. 3 times daily for now  Hyponatremia -Resolved sodium is 137  Hyperphosphatemia -Likely due to renal failure, improved and phosphorus is now 3.4 -Treatment per nephrology Phos is improved to 4.0 next-continue monitor and trend  Generalized weakness with debility -PT and OT as above  Chronic pain -Continue scheduled Tylenol, avoid sedating medications -Check LFTs given that he has been on scheduled Tylenol   GERD -PPI has been held in the setting of above  Gallbladder Thickening -CT A/P: Her wall thickening, without evidence of gallstones. -Patient with no abdominal pain on exam  LA Thrombus/atrial appendage thrombus -Was on Eliquis PTA but held in anticipation of Renal Bx -Last dose of Eliquis was 3/17 -Transitioned to Heparin gtt and currently remains on it -Anticipating Bone Marrow Bx in AM   DVT prophylaxis: Enoxaparin 40 mg sq q24h Code Status: FULL CODE  Family Communication: No family present at bedside Disposition Plan: Patient was admitted from home for metabolic encephalopathy along with an AKI.  He underwent a renal biopsy which is still pending results.  Continues to have anemia and been given blood and for him.  Patient underwent a bone marrow biopsy this morning and will likely go to SNF once medically stable cleared by nephrology as well as oncology  Consultants:   Medical Oncology  Nephrology  Interventional Radiology    Procedures:  Renal ultrasound Ultra sound guided biopsy the left kidney:  Patient to go under bone marrow biopsy in a.m.   Antimicrobials:  Anti-infectives (From admission, onward)   Start     Dose/Rate Route Frequency Ordered Stop   05/13/19 1045  cefTRIAXone (ROCEPHIN) 1 g in sodium chloride 0.9 % 100  mL IVPB  Status:   Discontinued     1 g 200 mL/hr over 30 Minutes Intravenous Every 24 hours 05/13/19 1036 05/18/19 1024   05/08/19 0130  cefTRIAXone (ROCEPHIN) 1 g in sodium chloride 0.9 % 100 mL IVPB  Status:  Discontinued     1 g 200 mL/hr over 30 Minutes Intravenous Daily at bedtime 05/08/19 0100 05/09/19 5449     Subjective: Patient was seen and examined at bedside and he was calm.  Had no complaints at this time but remains somewhat confused.  No nausea or vomiting.  Denies any current pain.  Objective: Vitals:   05/22/19 0930 05/22/19 0935 05/22/19 1010 05/22/19 1115  BP: (!) 156/81 140/84 136/72 (!) 141/73  Pulse: 73 78 66 63  Resp: 10 20 16 20   Temp:   98.4 F (36.9 C) 98.6 F (37 C)  TempSrc:   Oral Oral  SpO2: 100% 100% 100% 94%  Weight:      Height:        Intake/Output Summary (Last 24 hours) at 05/22/2019 1716 Last data filed at 05/22/2019 1500 Gross per 24 hour  Intake 675.22 ml  Output 775 ml  Net -99.78 ml   Filed Weights   05/17/19 2119 05/21/19 0439 05/21/19 2054  Weight: 99.8 kg 102.2 kg 103.2 kg   Examination: Physical Exam:  Constitutional: WN/WD American male currently in no acute distress and is more calm today and not as emotionally labile Eyes: Lids and conjunctivae normal, sclerae anicteric  ENMT: External Ears, Nose appear normal. Grossly normal hearing. Neck: Appears normal, supple, no cervical masses, normal ROM, no appreciable thyromegaly: No JVD Respiratory: Mildly diminished to auscultation bilaterally, no wheezing, rales, rhonchi or crackles. Normal respiratory effort and patient is not tachypenic. No accessory muscle use.  Unlabored breathing Cardiovascular: RRR, no murmurs / rubs / gallops. S1 and S2 auscultated. No extremity edema. Abdomen: Soft, non-tender, non-distended.  Bowel sounds positive x4.  GU: Deferred. Musculoskeletal: No clubbing / cyanosis of digits/nails. No joint deformity upper and lower extremities.  Skin: No rashes, lesions, ulcers  on limited skin evaluation. No induration; Warm and dry.  Neurologic: CN 2-12 grossly intact with no focal deficits. Romberg sign and cerebellar reflexes not assessed.  Psychiatric: Impaired judgment and insight. Alert and oriented x 2. Calm today   Data Reviewed: I have personally reviewed following labs and imaging studies  CBC: Recent Labs  Lab 05/18/19 0249 05/18/19 0909 05/19/19 0542 05/20/19 0012 05/20/19 0807 05/21/19 0507 05/22/19 0338  WBC 9.9  --  8.6 9.6  --  9.3 7.6  NEUTROABS  --   --   --   --   --   --  4.8  HGB 6.6*   < > 7.4* 6.7* 8.7* 7.9* 7.5*  HCT 19.9*   < > 23.3* 21.1* 26.7* 25.0* 24.3*  MCV 86.5  --  90.0 88.3  --  89.9 89.3  PLT 333  --  312 317  --  356 356   < > = values in this interval not displayed.   Basic Metabolic Panel: Recent Labs  Lab 05/18/19 0249 05/19/19 0542 05/20/19 0012 05/21/19 0507 05/22/19 0338  NA 135 138 137 137 137  K 4.0 3.9 3.7 4.7 4.2  CL 106 106 109 108 106  CO2 19* 21* 19* 22 21*  GLUCOSE 98 94 153* 98 89  BUN 36* 34* 35* 32* 31*  CREATININE 2.67* 2.45* 2.44* 2.16* 1.94*  CALCIUM 7.8* 8.2* 7.9* 8.1* 8.0*  MG  --   --   --   --  2.2  PHOS 3.8 4.2 3.6 4.0 3.4   GFR: Estimated Creatinine Clearance: 44.7 mL/min (A) (by C-G formula based on SCr of 1.94 mg/dL (H)). Liver Function Tests: Recent Labs  Lab 05/17/19 0122 05/17/19 0122 05/18/19 0249 05/19/19 0542 05/20/19 0012 05/21/19 0507 05/22/19 0338  AST 72*  --   --   --   --   --  61*  ALT 36  --   --   --   --   --  45*  ALKPHOS 247*  --   --   --   --   --  246*  BILITOT 0.9  --   --   --   --   --  0.8  PROT 6.7  --   --   --   --   --  6.4*  ALBUMIN 1.8*   < > 1.6* 1.6* 1.5* 1.7* 1.7*   < > = values in this interval not displayed.   No results for input(s): LIPASE, AMYLASE in the last 168 hours. No results for input(s): AMMONIA in the last 168 hours. Coagulation Profile: Recent Labs  Lab 05/19/19 0542  INR 1.4*   Cardiac Enzymes: No results for  input(s): CKTOTAL, CKMB, CKMBINDEX, TROPONINI in the last 168 hours. BNP (last 3 results) No results for input(s): PROBNP in the last 8760 hours. HbA1C: No results for input(s): HGBA1C in the last 72 hours. CBG: No results for input(s): GLUCAP in the last 168 hours. Lipid Profile: No results for input(s): CHOL, HDL, LDLCALC, TRIG, CHOLHDL, LDLDIRECT in the last 72 hours. Thyroid Function Tests: No results for input(s): TSH, T4TOTAL, FREET4, T3FREE, THYROIDAB in the last 72 hours. Anemia Panel: No results for input(s): VITAMINB12, FOLATE, FERRITIN, TIBC, IRON, RETICCTPCT in the last 72 hours. Sepsis Labs: No results for input(s): PROCALCITON, LATICACIDVEN in the last 168 hours.  Recent Results (from the past 240 hour(s))  Culture, blood (routine x 2)     Status: None   Collection Time: 05/13/19 10:04 AM   Specimen: BLOOD  Result Value Ref Range Status   Specimen Description BLOOD RIGHT ANTECUBITAL  Final   Special Requests   Final    AEROBIC BOTTLE ONLY Blood Culture results may not be optimal due to an inadequate volume of blood received in culture bottles   Culture   Final    NO GROWTH 5 DAYS Performed at [facility], [place] [address] [place], [place] [address]    Report Status 05/18/2019 FINAL  Final  Culture, blood (routine x 2)     Status: None   Collection Time: 05/13/19 10:30 AM   Specimen: BLOOD RIGHT ARM  Result Value Ref Range Status   Specimen Description BLOOD RIGHT ARM  Final   Special Requests   Final    AEROBIC BOTTLE ONLY Blood Culture results may not be optimal due to an inadequate volume of blood received in culture bottles   Culture   Final    NO GROWTH 5 DAYS Performed at [facility], [place] [address] [place], [place] [address]    Report Status 05/18/2019 FINAL  Final  Culture, Urine     Status: None   Collection Time: 05/13/19  6:58 PM   Specimen: Urine, Catheterized  Result Value Ref Range Status   Specimen Description URINE, CATHETERIZED   Final   Special Requests NONE  Final   Culture   Final    NO GROWTH Performed at [facility], [address] [place], [place] [address]    Report Status 05/14/2019 FINAL  Final     RN Pressure Injury Documentation:     Estimated body mass index is 26.98 kg/m as calculated from the following:   Height as of this encounter: 6' 5"  (1.956 m).   Weight as of this encounter: 103.2 kg.  Malnutrition Type:  Nutrition Problem: Inadequate oral intake Etiology: acute illness, poor appetite   Malnutrition Characteristics:  Signs/Symptoms: meal completion < 25%   Nutrition Interventions:  Interventions: Magic cup, Refer to RD note for recommendations, Liberalize Diet, Nepro shake   Radiology Studies: CT BONE MARROW BIOPSY & ASPIRATION  Result Date: 05/22/2019 CLINICAL DATA:  Elevated M spike and light chains EXAM: CT GUIDED DEEP ILIAC BONE ASPIRATION AND CORE BIOPSY TECHNIQUE: Patient was placed prone on the CT gantry and limited axial scans through the pelvis were obtained. Appropriate skin entry site was identified. Skin site was marked, prepped with chlorhexidine, draped in usual sterile fashion, and infiltrated locally with 1% lidocaine. Intravenous Fentanyl 165mg and Versed 252mwere administered as conscious sedation during continuous monitoring of the patient's level of consciousness and physiological / cardiorespiratory status by the radiology RN, with a total moderate sedation time of 10 minutes. Under CT fluoroscopic guidance an 11-gauge Cook trocar bone needle was advanced into the right iliac bone just lateral to the sacroiliac joint. Once needle tip position was confirmed, core and aspiration samples were obtained, submitted to pathology for approval. Post procedure scans show no hematoma or fracture. Patient tolerated procedure well. COMPLICATIONS: COMPLICATIONS none IMPRESSION: 1. Technically successful CT guided right iliac bone core and aspiration biopsy. Electronically  Signed   By: D Lucrezia Europe.D.   On: 05/22/2019 15:21   Scheduled Meds:  atorvastatin  80 mg Oral q1800   Chlorhexidine Gluconate Cloth  6 each Topical Daily   feeding supplement (ENSURE ENLIVE)  237 mL Oral BID BM   feeding supplement (PRO-STAT SUGAR FREE 64)  30 mL Oral QID   fentaNYL       influenza vaccine adjuvanted  0.5 mL Intramuscular Tomorrow-1000   levothyroxine  100 mcg Oral Q0600   midazolam       pneumococcal 23 valent vaccine  0.5 mL Intramuscular Tomorrow-1000   sodium bicarbonate  650 mg Oral TID   vitamin B-12  1,000 mcg Oral Daily   Continuous Infusions:  sodium chloride 500 mL (05/21/19 1938)   heparin 700 Units/hr (05/22/19 1351)    LOS: 14 days   OmWilliamsdaleDO [facility] PAGER is on AMHollywoodIf 7PM-7AM, please contact night-coverage [email or web address]

## 2019-05-22 NOTE — Progress Notes (Signed)
ANTICOAGULATION CONSULT NOTE - Follow Up Consult  Pharmacy Consult for Heparin (Eliquis on hold) Indication: LA thrombus 03/20/19  No Known Allergies  Patient Measurements: Height: _0  (195.6 cm) Weight: 229 lb 4.5 oz (104 kg) IBW/kg (Calculated) : 89.1 Heparin Dosing Weight: 98 kg  Vital Signs: Temp: 98.2 F (36.8 C) (03/25 2010) Temp Source: Oral (03/25 2010) BP: 140/80 (03/25 2010) Pulse Rate: 68 (03/25 2010)  Labs: Recent Labs    05/20/19 0012 05/20/19 0012 05/20/19 0807 05/20/19 1836 05/21/19 0507 05/22/19 0338 05/22/19 2159  HGB 6.7*   < > 8.7*  --  7.9* 7.5*  --   HCT 21.1*   < > 26.7*  --  25.0* 24.3*  --   PLT 317  --   --   --  356 356  --   APTT 91*   < >  --  65* 96* 106*  --   HEPARINUNFRC 1.22*   < >  --   --  0.85* 0.58 0.38  CREATININE 2.44*  --   --   --  2.16* 1.94*  --    < > = values in this interval not displayed.    Estimated Creatinine Clearance: 44.7 mL/min (A) (by C-G formula based on SCr of 1.94 mg/dL (H)).  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 Pharmacy consulted to begin heparin while Eliquis held in anticipation of renal biopsy. Last dose of Eliquis was on 3/17 PM.  Using aPTT for heparin monitoring while heparin levels are falsely elevated due to recent Eliquis doses. CT negative for abdominal bleed on 05/18/19.     Heparin held for renal biopsy on 3/22 then resumed post-procedure.     APTT just above target range (106 seconds) on 700 units/hr. Heparin level 0.58, now therapeutic; previously falsely elevated from recent Eliquis doses. Hgb trended down some again, no bleeding reported.     S/p bone marrow biopsy this morning. Heparin held on call to procedure and to resume 4 hours post-procedure per low risk guidelines..  3/25 AM update:  Heparin level therapeutic after re-start s/p procedure   Goal of Therapy:  Heparin level 0.3-0.7  units/mL Monitor platelets by anticoagulation protocol: Yes   Plan:  Cont heparin at 700 units/hr Confirmatory heparin level with AM labs  Narda Bonds, PharmD, [facility] Pharmacist Phone: [phone]

## 2019-05-22 NOTE — Progress Notes (Addendum)
Jeremy Sherman Progress Note    Assessment/ Plan:   Pt is a 69 y.o. yo male with HTN, CAD who was admitted on 05/07/2019 with AKI (crt 1.08 on 04/21/19)    1. AKI-  True AKI from normal crt on 04/21/19.  Left hospital with indwelling foley-  Has stayed in place and changed out per home health.  Many new meds per cardiology in setting of recent CVA including brilinta and a statin.  U/A showing 100 of prot, white and red blood cells- possible UTI but no growth on culture.  Renal u/s normal with 12 cm kidneys.  Is non oliguric. BP has not been low.  CK 78.  ANA negative, complements WNL and ANCA negative. Protein to crt ratio 0.3 g   M-spike 1.0 and abnormal K/L FLC ratio. Has not had + urine culture in 2021.    S/p renal biopsy 3/22:  Prelim is severe stenosing arteriosclerosis, chronic active interstitial nephritis.  Thankfully renal function has improved and Cr now 1.9. He was on a PPI recently which has been off this admission - query PPI induced interstitial nephritis.  With continuously improving renal function I'm not excited about giving steroids as he seems high risk for complications but have placed call to [facility] nephropathology for additional info re: how much activity and scarring.   2. + M-spike- onc has been consulted.  Bone marrow biopsy pending. 3.  Fever/ vomiting: Urine, blood cultures unrevealing, AXR unrevealing; off abx for now. Per primary.  4. HTN/volume-  s/p IVFs, po intake for now/  Normotensive. 5 Anemia-  Received feraheme x 2 while here.  Hb 7.9 this AM.  Was transfused during admission. Has been on heparin gtt for procedures and CT negative for RP bleed on 8/93.   6 Metabolic acidosis - on oral bicarb - improved 7.  Recent CVA: holding brilinta and Eliquis for procedures, on hep gtt 8.  Dispo: Pending bone marrow biopsy.   ADDENDUM: spoke with [facility] nephropathology re: biopsy.  Pretty limited sample, kappa restricted protein in tubules (not cast nephropathy, LCDD  or amyloid) but ? Of end organ damage from monoclonal protein ? MM vs MGRS.  Will await final renal path and bone marrow biopsy.   Subjective:    He has no complaints.  Family at bedside. Bone marrow biopsy completed today.  UOP 1.1L yesterday.  Objective:   BP (!) 141/73 (BP Location: Right Arm)   Pulse 63   Temp 98.6 F (37 C) (Oral)   Resp 20   Ht '6\' 5"'$  (1.956 m)   Wt 103.2 kg   SpO2 94%   BMI 26.98 kg/m   Intake/Output Summary (Last 24 hours) at 05/22/2019 1319 Last data filed at 05/22/2019 0900 Gross per 24 hour  Intake 311.73 ml  Output 775 ml  Net -463.27 ml   Weight change: 1 kg  Physical Exam: Gen: sitting in bed sleepy CVS: RRR Resp: clear Abd: soft, nontender TDS:KAJGO LE edema, very tender with palpation which he says is chronic.  Imaging: No results found.  Labs: BMET Recent Labs  Lab 05/16/19 0347 05/17/19 0122 05/18/19 0249 05/19/19 0542 05/20/19 0012 05/21/19 0507 05/22/19 0338  NA 137 136 135 138 137 137 137  K 4.0 3.9 4.0 3.9 3.7 4.7 4.2  CL 106 107 106 106 109 108 106  CO2 17* 19* 19* 21* 19* 22 21*  GLUCOSE 85 90 98 94 153* 98 89  BUN 31* 31* 36* 34* 35* 32* 31*  CREATININE 2.80* 2.85* 2.67* 2.45* 2.44* 2.16* 1.94*  CALCIUM 8.0* 8.0* 7.8* 8.2* 7.9* 8.1* 8.0*  PHOS 3.7 4.3 3.8 4.2 3.6 4.0 3.4   CBC Recent Labs  Lab 05/19/19 0542 05/19/19 0542 05/20/19 0012 05/20/19 0807 05/21/19 0507 05/22/19 0338  WBC 8.6  --  9.6  --  9.3 7.6  NEUTROABS  --   --   --   --   --  4.8  HGB 7.4*   < > 6.7* 8.7* 7.9* 7.5*  HCT 23.3*   < > 21.1* 26.7* 25.0* 24.3*  MCV 90.0  --  88.3  --  89.9 89.3  PLT 312  --  317  --  356 356   < > = values in this interval not displayed.    Medications:    . atorvastatin  80 mg Oral q1800  . Chlorhexidine Gluconate Cloth  6 each Topical Daily  . feeding supplement (ENSURE ENLIVE)  237 mL Oral BID BM  . feeding supplement (PRO-STAT SUGAR FREE 64)  30 mL Oral QID  . fentaNYL      . influenza vaccine  adjuvanted  0.5 mL Intramuscular Tomorrow-1000  . levothyroxine  100 mcg Oral Q0600  . midazolam      . pneumococcal 23 valent vaccine  0.5 mL Intramuscular Tomorrow-1000  . sodium bicarbonate  650 mg Oral TID  . vitamin B-12  1,000 mcg Oral Daily     Jannifer Hick MD [facility] Pager [phone]

## 2019-05-22 NOTE — Procedures (Signed)
  Procedure: CT bone marrow biopsy  R illiac EBL:   minimal Complications:  none immediate  See full dictation in [facility].  Dillard Cannon MD Main # [phone] Pager  [phone]

## 2019-05-22 NOTE — Plan of Care (Signed)
  Problem: Clinical Measurements: Goal: Dialysis access will remain free of complications Outcome: Not Applicable   

## 2019-05-23 LAB — COMPREHENSIVE METABOLIC PANEL
ALT: 43 U/L (ref 0–44)
AST: 56 U/L — ABNORMAL HIGH (ref 15–41)
Albumin: 1.7 g/dL — ABNORMAL LOW (ref 3.5–5.0)
Alkaline Phosphatase: 228 U/L — ABNORMAL HIGH (ref 38–126)
Anion gap: 8 (ref 5–15)
BUN: 36 mg/dL — ABNORMAL HIGH (ref 8–23)
CO2: 23 mmol/L (ref 22–32)
Calcium: 8.1 mg/dL — ABNORMAL LOW (ref 8.9–10.3)
Chloride: 107 mmol/L (ref 98–111)
Creatinine, Ser: 1.79 mg/dL — ABNORMAL HIGH (ref 0.61–1.24)
GFR calc Af Amer: 44 mL/min — ABNORMAL LOW (ref >60)
GFR calc non Af Amer: 38 mL/min — ABNORMAL LOW (ref >60)
Glucose, Bld: 102 mg/dL — ABNORMAL HIGH (ref 70–99)
Potassium: 4.5 mmol/L (ref 3.5–5.1)
Sodium: 138 mmol/L (ref 135–145)
Total Bilirubin: 1 mg/dL (ref 0.3–1.2)
Total Protein: 6.6 g/dL (ref 6.5–8.1)

## 2019-05-23 LAB — RENAL FUNCTION PANEL
Albumin: 1.7 g/dL — ABNORMAL LOW (ref 3.5–5.0)
Anion gap: 9 (ref 5–15)
BUN: 36 mg/dL — ABNORMAL HIGH (ref 8–23)
CO2: 22 mmol/L (ref 22–32)
Calcium: 8.1 mg/dL — ABNORMAL LOW (ref 8.9–10.3)
Chloride: 108 mmol/L (ref 98–111)
Creatinine, Ser: 1.78 mg/dL — ABNORMAL HIGH (ref 0.61–1.24)
GFR calc Af Amer: 44 mL/min — ABNORMAL LOW (ref >60)
GFR calc non Af Amer: 38 mL/min — ABNORMAL LOW (ref >60)
Glucose, Bld: 100 mg/dL — ABNORMAL HIGH (ref 70–99)
Phosphorus: 3.3 mg/dL (ref 2.5–4.6)
Potassium: 4.5 mmol/L (ref 3.5–5.1)
Sodium: 139 mmol/L (ref 135–145)

## 2019-05-23 LAB — CBC WITH DIFFERENTIAL/PLATELET
Abs Immature Granulocytes: 0.07 10*3/uL (ref 0.00–0.07)
Basophils Absolute: 0.1 10*3/uL (ref 0.0–0.1)
Basophils Relative: 1 %
Eosinophils Absolute: 0.2 10*3/uL (ref 0.0–0.5)
Eosinophils Relative: 2 %
HCT: 24.2 % — ABNORMAL LOW (ref 39.0–52.0)
Hemoglobin: 7.5 g/dL — ABNORMAL LOW (ref 13.0–17.0)
Immature Granulocytes: 1 %
Lymphocytes Relative: 21 %
Lymphs Abs: 1.8 10*3/uL (ref 0.7–4.0)
MCH: 28.1 pg (ref 26.0–34.0)
MCHC: 31 g/dL (ref 30.0–36.0)
MCV: 90.6 fL (ref 80.0–100.0)
Monocytes Absolute: 0.8 10*3/uL (ref 0.1–1.0)
Monocytes Relative: 10 %
Neutro Abs: 5.4 10*3/uL (ref 1.7–7.7)
Neutrophils Relative %: 65 %
Platelets: 386 10*3/uL (ref 150–400)
RBC: 2.67 MIL/uL — ABNORMAL LOW (ref 4.22–5.81)
RDW: 17.9 % — ABNORMAL HIGH (ref 11.5–15.5)
WBC: 8.3 10*3/uL (ref 4.0–10.5)
nRBC: 0 % (ref 0.0–0.2)

## 2019-05-23 LAB — IMMUNOFIXATION ELECTROPHORESIS
IgA: 234 mg/dL (ref 61–437)
IgG (Immunoglobin G), Serum: 1772 mg/dL — ABNORMAL HIGH (ref 603–1613)
IgM (Immunoglobulin M), Srm: 168 mg/dL (ref 20–172)
Total Protein ELP: 5.9 g/dL — ABNORMAL LOW (ref 6.0–8.5)

## 2019-05-23 LAB — MAGNESIUM: Magnesium: 2.1 mg/dL (ref 1.7–2.4)

## 2019-05-23 LAB — PHOSPHORUS: Phosphorus: 3.3 mg/dL (ref 2.5–4.6)

## 2019-05-23 LAB — HEPARIN LEVEL (UNFRACTIONATED): Heparin Unfractionated: 0.35 IU/mL (ref 0.30–0.70)

## 2019-05-23 NOTE — Progress Notes (Addendum)
IP PROGRESS NOTE  Subjective:   Resting quietly in bed.  He has no complaints today.  No family at the bedside.  Objective: Vital signs in last 24 hours: Blood pressure 131/85, pulse 74, temperature 99 F (37.2 C), temperature source Oral, resp. rate 16, height _0  (1.956 m), weight 104 kg, SpO2 94 %.  Intake/Output from previous day: 03/25 0701 - 03/26 0700 In: 1198.5 [P.O.:580; I.V.:381.5; NG/GT:237] Out: 1225 [Urine:1225]  Physical Exam:  General: Awake and alert, no distress Cardiovascular: Regular rate and rhythm, trace bilateral lower extremity edema Lungs: Clear to auscultation bilaterally  Lab Results: Recent Labs    05/22/19 0338 05/23/19 0434  WBC 7.6 8.3  HGB 7.5* 7.5*  HCT 24.3* 24.2*  PLT 356 386   Blood smear from 05/20/2019: The platelets appear normal in number, few large platelets.  Few plasmacytoid lymphocytes, but no monotonous white cell population.  No blasts.  Numerous acanthocytes and burr cells.  Rare schistocytes.  Few ovalocytes.  Recent Labs    05/22/19 0338 05/23/19 0434  NA 137 138  139  K 4.2 4.5  4.5  CL 106 107  108  CO2 21* 23  22  GLUCOSE 89 102*  100*  BUN 31* 36*  36*  CREATININE 1.94* 1.79*  1.78*  CALCIUM 8.0* 8.1*  8.1*    No results found for: CEA1  Studies/Results: CT BONE MARROW BIOPSY & ASPIRATION  Result Date: 05/22/2019 CLINICAL DATA:  Elevated M spike and light chains EXAM: CT GUIDED DEEP ILIAC BONE ASPIRATION AND CORE BIOPSY TECHNIQUE: Patient was placed prone on the CT gantry and limited axial scans through the pelvis were obtained. Appropriate skin entry site was identified. Skin site was marked, prepped with chlorhexidine, draped in usual sterile fashion, and infiltrated locally with 1% lidocaine. Intravenous Fentanyl 174mg and Versed 275mwere administered as conscious sedation during continuous monitoring of the patient's level of consciousness and physiological / cardiorespiratory status by the  radiology RN, with a total moderate sedation time of 10 minutes. Under CT fluoroscopic guidance an 11-gauge Cook trocar bone needle was advanced into the right iliac bone just lateral to the sacroiliac joint. Once needle tip position was confirmed, core and aspiration samples were obtained, submitted to pathology for approval. Post procedure scans show no hematoma or fracture. Patient tolerated procedure well. COMPLICATIONS: COMPLICATIONS none IMPRESSION: 1. Technically successful CT guided right iliac bone core and aspiration biopsy. Electronically Signed   By: D Lucrezia Europe.D.   On: 05/22/2019 15:21    Medications: I have reviewed the patient's current medications.  Assessment/Plan:  1.  Elevated M spike and kappa light chains concerning for multiple myeloma -05/12/2019-M spike was 1.0, kappa free light chain to 20.7, lambda free light chain 40.9, kappa, lambda light chain ratio 5.40 -Elevated urine free kappa light chains -Negative metastatic bone survey 05/20/2019 -Bone marrow biopsy 05/22/2019, increased plasma cells, final report pending  2.  AKI -Renal biopsy performed on 05/19/2019 and results are pending 3.  Anemia 4.  Complicated UTI/pyelonephritis with chronic indwelling Foley catheter due to urinary retention and BPH 5.  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  CAD 7.  Hypertension 8.  Hypothyroidism 9.  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  Diabetes  Jeremy Sherman appears unchanged.  He has elevated serum and urine kappa light chains with a serum M spike.  Serum immunofixation is pending.  He is status post bone marrow biopsy on 05/22/2019.  Spoke with Dr. Tresa Moore of pathology earlier today who is indicated that he has a high number of plasma cells and preliminary working diagnosis of multiple myeloma.  However, final pathology  report will not be available until Monday.  Renal function has continued to improve this admission.  Recommendations: 1.  Will follow up on final bone marrow biopsy results which will not be available until early next week. 2.  Since the patient lives in [place], [place] we will plan for outpatient follow-up at the [facility] cancer center.  We will reach out to arrange for outpatient follow-up.  The patient may discharge to home from our perspective if otherwise medically stable.  There is no indication for urgent chemotherapy given his continued improvement in his renal function.  Please call medical oncology over the weekend for questions.  We will follow up on Monday if he is still in the hospital.    LOS: 15 days   Jeremy Bussing, Jeremy Sherman   05/23/2019, 11:11 AM Jeremy Sherman appears stable.  The serum immunofixation is still pending.  The final bone marrow result is pending.  I discussed the case with Dr. Tresa Moore.  She indicates there are an increased number of atypical appearing plasma cells consistent with a diagnosis of multiple myeloma.  Immunohistochemical stains are pending to establish clonality and the extent of bone marrow involvement.  I suspect he does have multiple myeloma.  He appears stable from a hematologic standpoint and the renal function has improved/stabilized.  I will wait on the final bone marrow result prior to initiating systemic therapy.  I contacted Dr. Bobby Rumpf at the [facility] cancer center.  He will see Jeremy Sherman as an outpatient.  I attempted to contact Ms. Squillace, but could not reach her by telephone.

## 2019-05-23 NOTE — Progress Notes (Signed)
ANTICOAGULATION CONSULT NOTE - Follow Up Consult  Pharmacy Consult for Heparin (Eliquis on hold) Indication: LA thrombus 03/20/19  No Known Allergies  Patient Measurements: Height: 6' 5" (195.6 cm) Weight: 229 lb 4.5 oz (104 kg) IBW/kg (Calculated) : 89.1 Heparin Dosing Weight: 98 kg  Vital Signs: Temp: 99 F (37.2 C) (03/26 0459) Temp Source: Oral (03/26 0459) BP: 131/85 (03/26 0459) Pulse Rate: 74 (03/26 0459)  Labs: Recent Labs    05/20/19 1836 05/21/19 0507 05/21/19 0507 05/22/19 0338 05/22/19 2159 05/23/19 0434  HGB  --  7.9*   < > 7.5*  --  7.5*  HCT  --  25.0*  --  24.3*  --  24.2*  PLT  --  356  --  356  --  386  APTT 65* 96*  --  106*  --   --   HEPARINUNFRC  --  0.85*   < > 0.58 0.38 0.35  CREATININE  --  2.16*  --  1.94*  --  1.79*  1.78*   < > = values in this interval not displayed.    Estimated Creatinine Clearance: 48.7 mL/min (A) (by C-G formula based on SCr of 1.78 mg/dL (H)).  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 Pharmacy consulted to begin heparin while Eliquis held in anticipation of renal biopsy. Last dose of Eliquis was on 3/17 PM.  CT negative for abdominal bleed on 05/18/19.     Heparin held for renal biopsy on 3/22 then resumed post-procedure.    Also held for bone marrow biopsy on 3/25 then resumed 4 hours post-procedure per low risk guidelines.     Heparin level remains therapeutic (0.35) on 700 units/hr.    No longer using aPTTs as heparin levels are no longer falsely elevated from recent Eliquis doses. Hgb low stable. No bleeding reported.   Goal of Therapy:  Heparin level 0.3-0.7 units/ml Monitor platelets by anticoagulation protocol: Yes   Plan:   Continue heparin drip at 700 units/hr.  Daily heparin level and CBC.  Monitor for s/sx bleeding.  Eliquis and Brilinta held for procedures.    Arty Baumgartner, [place] Phone:  [phone] 05/23/2019,12:17 PM

## 2019-05-23 NOTE — Progress Notes (Signed)
Patient ID: Jeremy Sherman, male   DOB: October 08, 1950, 69 y.o.   MRN: [id number] [facility] Progress Note   Assessment/ Plan:   1. Acute kidney Injury: Nonoliguric with creatinine remaining roughly around 1.7 overnight.  He does not have any acute electrolyte abnormality, significant volume overload or uremic signs or symptoms to prompt intervention at this time.  Concern raised for possible MGRS and chronic active interstitial nephritis for which PPI therapy has been discontinued.  Given downtrending creatinine, prednisone not used.  Continue sodium bicarbonate for metabolic acidosis. 2.  Elevated M spike/kappa light chains.  Concern for plasma cell dyscrasia/multiple myeloma with negative bone survey on 05/20/2019.  Status post bone marrow biopsy-results pending. 3.  Hypertension: Blood pressures marginally elevated status post IV fluids, continue to monitor for antihypertensive therapy. 4.  Anemia: With evidence of iron deficiency status post PRBC and intravenous iron earlier.  No overt loss and will await bone marrow biopsy results prior to ESA. 5.  History of recent CVA: Anticoagulation therapy/secondary prophylactic therapy on hold to allow for procedures.  Subjective:   Reports some pain over his back/legs   Objective:   BP 131/85 (BP Location: Right Arm)   Pulse 74   Temp 99 F (37.2 C) (Oral)   Resp 16   Ht 6' 5"  (1.956 m)   Wt 104 kg   SpO2 94%   BMI 27.19 kg/m   Intake/Output Summary (Last 24 hours) at 05/23/2019 1103 Last data filed at 05/23/2019 0636 Gross per 24 hour  Intake 1090.62 ml  Output 1225 ml  Net -134.38 ml   Weight change: 0.8 kg  Physical Exam: Gen: Appears to be fatigued resting in bed, easy to awaken and engage in conversation CVS: Pulse regular rhythm, normal S1 and S2 with ESM Resp: Clear to auscultation, no rales/rhonchi Abd: Soft, obese, nontender Ext: Trace to 1+ bilateral lower extremity edema with tenderness and some skin  scabs.   Imaging: CT BONE MARROW BIOPSY & ASPIRATION  Result Date: 05/22/2019 CLINICAL DATA:  Elevated M spike and light chains EXAM: CT GUIDED DEEP ILIAC BONE ASPIRATION AND CORE BIOPSY TECHNIQUE: Patient was placed prone on the CT gantry and limited axial scans through the pelvis were obtained. Appropriate skin entry site was identified. Skin site was marked, prepped with chlorhexidine, draped in usual sterile fashion, and infiltrated locally with 1% lidocaine. Intravenous Fentanyl 154mg and Versed 23mwere administered as conscious sedation during continuous monitoring of the patient's level of consciousness and physiological / cardiorespiratory status by the radiology RN, with a total moderate sedation time of 10 minutes. Under CT fluoroscopic guidance an 11-gauge Cook trocar bone needle was advanced into the right iliac bone just lateral to the sacroiliac joint. Once needle tip position was confirmed, core and aspiration samples were obtained, submitted to pathology for approval. Post procedure scans show no hematoma or fracture. Patient tolerated procedure well. COMPLICATIONS: COMPLICATIONS none IMPRESSION: 1. Technically successful CT guided right iliac bone core and aspiration biopsy. Electronically Signed   By: D Lucrezia Europe.D.   On: 05/22/2019 15:21    Labs: BMET Recent Labs  Lab 05/17/19 0122 05/18/19 0249 05/19/19 0542 05/20/19 0012 05/21/19 0507 05/22/19 0338 05/23/19 0434  NA 136 135 138 137 137 137 138  139  K 3.9 4.0 3.9 3.7 4.7 4.2 4.5  4.5  CL 107 106 106 109 108 106 107  108  CO2 19* 19* 21* 19* 22 21* 23  22  GLUCOSE 90 98 94 153* 98  89 102*  100*  BUN 31* 36* 34* 35* 32* 31* 36*  36*  CREATININE 2.85* 2.67* 2.45* 2.44* 2.16* 1.94* 1.79*  1.78*  CALCIUM 8.0* 7.8* 8.2* 7.9* 8.1* 8.0* 8.1*  8.1*  PHOS 4.3 3.8 4.2 3.6 4.0 3.4 3.3  3.3   CBC Recent Labs  Lab 05/20/19 0012 05/20/19 0012 05/20/19 0807 05/21/19 0507 05/22/19 0338 05/23/19 0434  WBC 9.6  --    --  9.3 7.6 8.3  NEUTROABS  --   --   --   --  4.8 5.4  HGB 6.7*   < > 8.7* 7.9* 7.5* 7.5*  HCT 21.1*   < > 26.7* 25.0* 24.3* 24.2*  MCV 88.3  --   --  89.9 89.3 90.6  PLT 317  --   --  356 356 386   < > = values in this interval not displayed.    Medications:    . atorvastatin  80 mg Oral q1800  . Chlorhexidine Gluconate Cloth  6 each Topical Daily  . feeding supplement (ENSURE ENLIVE)  237 mL Oral BID BM  . feeding supplement (PRO-STAT SUGAR FREE 64)  30 mL Oral QID  . influenza vaccine adjuvanted  0.5 mL Intramuscular Tomorrow-1000  . levothyroxine  100 mcg Oral Q0600  . pneumococcal 23 valent vaccine  0.5 mL Intramuscular Tomorrow-1000  . sodium bicarbonate  650 mg Oral TID  . vitamin B-12  1,000 mcg Oral Daily   Elmarie Shiley, MD 05/23/2019, 11:03 AM

## 2019-05-23 NOTE — Plan of Care (Signed)
  Problem: Health Behavior/Discharge Planning: Goal: Ability to manage health-related needs will improve Outcome: Progressing   

## 2019-05-23 NOTE — Progress Notes (Signed)
Physical Therapy Treatment Patient Details Name: Jeremy Sherman MRN: [id number] DOB: 05/13/1950 Today's Date: 05/23/2019    History of Present Illness 69 year old male admitted to ED on 3/10 with AKI, AMS, R chest wall pain. Pt with foley catheter use x2 weeks. Pt with recent hospitalization for CVA and aneurysm repair, d/c from CIR on 04/21/2019. 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    PT Comments    Pt demonstrating gradual progress.  Goals were extended.  Pt requiring min-mod A for transfers.  Able to ambulate 5'x2 with RW.  Required increased time, cues for sequencing and safety.  Cont POC.    Follow Up Recommendations  SNF     Equipment Recommendations  Wheelchair cushion (measurements PT);Wheelchair (measurements PT);Rolling walker with 5" wheels;3in1 (PT)    Recommendations for Other Services       Precautions / Restrictions Precautions Precautions: Fall    Mobility  Bed Mobility Overal bed mobility: Needs Assistance Bed Mobility: Supine to Sit     Supine to sit: Mod assist;HOB elevated     General bed mobility comments: Required cues for sequencing; min A for legs off bed and mod A to lift trunk and scoot to EOB  Transfers Overall transfer level: Needs assistance Equipment used: Rolling walker (2 wheeled) Transfers: Sit to/from Stand Sit to Stand: Min assist;Mod assist         General transfer comment: Sit to stand with increased time; min A from elevated bed and mod A from chair with support under buttock; cues for hand placement and use of momentum  Ambulation/Gait Ambulation/Gait assistance: Min assist Gait Distance (Feet): 5 Feet(x2) Assistive device: Rolling walker (2 wheeled) Gait Pattern/deviations:  Decreased stride length;Trunk flexed;Step-to pattern Gait velocity: decr   General Gait Details: minA for steadying and RW management, fatigued easily ; cued for posture   Stairs             Wheelchair Mobility    Modified Rankin (Stroke Patients Only)       Balance Overall balance assessment: Needs assistance Sitting-balance support: Feet supported;No upper extremity supported Sitting balance-Leahy Scale: Good Sitting balance - Comments: Sat EOB for 5 mins prior to transfer   Standing balance support: Bilateral upper extremity supported;During functional activity Standing balance-Leahy Scale: Poor Standing balance comment: Cues for posture correction, Min A initially progressing to Min guard                             Cognition Arousal/Alertness: Awake/alert Behavior During Therapy: WFL for tasks assessed/performed Overall Cognitive Status: Impaired/Different from baseline                   Orientation Level: Disoriented to;Time;Situation     Following Commands: Follows one step commands consistently     Problem Solving: Slow processing;Decreased initiation;Requires verbal cues;Difficulty sequencing;Requires tactile cues General Comments: Pt tends to yell with movements and transfers.  No crying episodes today - family present      Exercises      General Comments General comments (skin integrity, edema, etc.): VSS      Pertinent Vitals/Pain Pain Assessment: Faces Faces Pain Scale: Hurts even more Pain Location: R chest under arm (reports hurting for weeks) Pain Descriptors / Indicators: Sore;Guarding;Grimacing Pain Intervention(s): Limited activity within patient's tolerance;Monitored during  session;Repositioned;Relaxation    Home Living                      Prior Function            PT Goals (current goals can now be found in the care plan section) Progress towards PT goals: Progressing toward goals     Frequency    Min 2X/week      PT Plan Current plan remains appropriate    Co-evaluation              AM-PAC PT "6 Clicks" Mobility   Outcome Measure  Help needed turning from your back to your side while in a flat bed without using bedrails?: A Little Help needed moving from lying on your back to sitting on the side of a flat bed without using bedrails?: A Lot Help needed moving to and from a bed to a chair (including a wheelchair)?: A Lot Help needed standing up from a chair using your arms (e.g., wheelchair or bedside chair)?: A Lot Help needed to walk in hospital room?: A Little Help needed climbing 3-5 steps with a railing? : A Lot 6 Click Score: 14    End of Session Equipment Utilized During Treatment: Gait belt Activity Tolerance: Patient limited by fatigue Patient left: in chair;with call bell/phone within reach;with chair alarm set;with family/visitor present(pillows under to elevate seat) Nurse Communication: Mobility status PT Visit Diagnosis: Other abnormalities of gait and mobility (R26.89);Muscle weakness (generalized) (M62.81)     Time: [id number] PT Time Calculation (min) (ACUTE ONLY): 24 min  Charges:  $Gait Training: 8-22 mins $Therapeutic Activity: 8-22 mins                     Maggie Font, PT Acute Rehab Services Pager [phone] [facility] [phone] [facility] [phone]    [name] 05/23/2019, 4:20 PM

## 2019-05-23 NOTE — Progress Notes (Signed)
PROGRESS NOTE    Jeremy Sherman  LSL:[id number] DOB: [birth year] DOA: 05/07/2019 PCP: Jeremy Riches, NP  Brief Narrative:  HPI On 05/08/2019 by Dr. Shela Leff Jeremy Charon [name] medical history significant ofCAD status post CABG, hypertension, hyperlipidemia, hypothyroidism, prediabetes, recent hospital admission for stroke,carotid artery disease status post stent on Eliquis and Brilinta, left atrial appendage thrombus presenting to the ED for evaluation of abnormal labs. Patient was called by his physician after outpatient labs revealed worsening renal failure.Patient appears confused and is not sure why he is here. He has no complaints. Denies fevers, chills, chest pain, Sherman, shortness of breath, nausea, vomiting, abdominal pain, diarrhea, flank pain, or dysuria. No family available at this time. Per ED provider's conversation with the patient's wife since his discharge from rehab 2 weeks ago he has had slowly worsening confusion. He has an indwelling Foley catheter since his prior hospitalization which has been draining urine.  Interim history Patient found to have acute kidney injury and nephrology consulted and following.  Also developed fever with worsening leukocytosis, work-up thus far has been unremarkable and he continues to spike intermittent temperatures with a T-max of 100.4 yesterday.  There was initial concern of CAUTI when he was first admitted however repeat urine cultures show no growth.  Patient underwent a renal biopsy on 05/19/2019 with results still pending.  Because of the elevated M spike he is undergoing a multiple myeloma work-up and received a bone marrow biopsy yesterday morning given that his bone survey is negative.  Preliminary bone marrow biopsy findings showed increased plasma cells that are atypical consistent with a diagnosis of multiple myeloma but the immunohistochemical stains are pending to establish  tonality and the oncologist does suspect the patient has multiple myeloma.  They feel that he is stable from a hematologic standpoint and that his renal function is improved and stabilized and await on final bone marrow biopsy result prior to initiating systemic therapy and patient is likely to be referred to the [facility].  Patient's renal failure is improving and given the downtrending creatinine prednisone was not used and they are recommending continue sodium bicarbonate for metabolic acidosis  Assessment & Plan:   Principal Problem:   AKI (acute kidney injury) ([place]) Active Problems:   Essential hypertension   UTI (urinary tract infection) due to urinary indwelling catheter ([facility])   Acute metabolic encephalopathy   QT prolongation  Acute kidney injury, improving  -Creatinine on admission 4.72 (baseline approximately 1) -BUN/creatinine is trended down is 36/1.79 -Nephrology consulted and appreciated-renal obtained out biopsy -Interventional radiology consulted and appreciated, s/p US guided biopsy of left kidney- biopsy results pending  -Renal ultrasound unremarkable -ANCA negative -Patient was placed on IV fluids which is now stopped -Renal biopsy preliminary showed severe stenosing arteriosclerosis and chronic active interstitial nephritis -Dr. Johnney Sherman thinks that this could be PPI induced Intersitial Nephritis -Dr. Johnney Sherman has placed a call to Jeremy Sherman nephro pathology for additional information about possibly starting the patient on steroids per Jeremy Sherman feels that it is not indicated at this time given her downtrending creatinine -Continue to monitor CMP in a.m.  M spike with concern for multiple myeloma -patient noted to have M spike and pending renal biopsy results -Oncology, Jeremy Sherman, consulted and appreciated. Recommended serum immunofixation and bone survey; -Serum immunofixation is finally back and showed a total protein ELP of 5.9, and IgG of 1772, and IgM of  168, and an IgA level of 234 -Nephrology is also ordering  SPEP and free light chains as well as patient having abnormal kappa lambda free light chain ratio -Bone survey was done and showed "Unremarkable bone survey. No lytic or sclerotic bone lesions are identified." -Patient underwent a bone marrow biopsy yesterday and Jeremy Sherman reviewed this and preliminary report showed 30% plasma cells which is a not a definitive diagnosis of multiple myeloma but will review with the pathologist in detail tomorrow and to initiate systemic treatment for myeloma if indicated; Jeremy Sherman discussed with Jeremy Sherman of pathology and she indicated that there are increased number of atypical appearing plasma cells consistent with the diagnosis of multiple myeloma however the immunohistochemical stains are still pending to establish clonality and extent of bone marrow involvement -Dr. Malachy Sherman does believe the patient has multiple myeloma but feels that he is stable from a hematological standpoint and the renal function has improved and stabilized and they will go to await final bone marrow results prior to initiating systemic chemotherapy and they have contacted the outpatient Jeremy Sherman oncologist Jeremy Sherman -Patient is stable from a hematological perspective to be discharged and will need SNF  Complicated UTI/pyelonephritis/pyuria with hematuria in a patient with chronic indwelling Foley catheter due to urinary retention and BPH -Urine culture initially unremarkable.  Repeat urine culture on 05/13/2019 show no growth  -Foley catheter was changed on 05/13/2019 -Blood cultures show no growth to date -patient was placed on IV ceftriaxone - and completed course during hospitalization -Patient continues to have fever and some vomiting and may need to resume antibiotics but will reculture if he spiked another temperature; likely temperatures are related to multiple myeloma as above -May need to discuss with  infectious diseases  Acute metabolic encephalopathy, continues to be somewhat confused -Suspect some underlying cognitive impairment -Multifactorial including dehydration, AKI, hyperammonemia, low B12, tramadol use -Appears to be improving, although unsure patient's baseline -patient was very emotionally labile y a few days ago but today was pleasant and calm -Continue with delirium precautions -PT OT recommending SNF  Normocytic Anemia -Certainly iron deficiency anemia versus anemia of chronic disease -hemoglobin dropped day before yesterday to 6.7 -has received 2u PRBC during hospitalization; now hemoglobin/hematocrit has been trending down and stable from yesterday 7.5/24.2 -Iron saturation 9, B12 142 -Was given B12 supplementation along with IV iron and ESA per nephrology; patient has received Feraheme x2 will be in the hospital -FOBT negative; urine is slightly blood-tinged but this is improved -Of note, patient on Brilinta and Eliquis-this was held for renal biopsy and bone marrow biopsy and patient was placed on a heparin drip however IV can be resumed shortly -CT abdomen pelvis made no mention of bleed -continue monitor blood count carefully and repeat CBC in a.m.  History of CVA -Left CAS/left MCA stenosis status post left CEA and left MCA stent in January 2021.  On Brilinta.  Patient also noted to have a left atrial appendage thrombus was placed on Eliquis. -Currently no focal neuro deficits other than lower extremity bilateral weakness.  Patient was discharged home from rehab on 04/24/2019. -Continue Brilinta, Eliquis, statin unable- as above- hold Brilinta and Eliquis  -PT, OT recommending SNF anticipating discharging the next 24 to 48 hours  History of coronary artery disease -Patient with CABG in 2019 -Currently stable, no complaints of chest pain -continue with Brilinta when able  Essential hypertension -Currently on no meds, will add on meds as  needed  Hypothyroidism -Continue Synthroid -TSH 6.5- however this was a hemolyzed sample  Metabolic acidosis  -  Improved and his CO2 is now 23 with a chloride level of 108 and anion gap of 9 -Continue with bicarbonate 650 mg p.o. 3 times daily for now  Hyponatremia -Resolved sodium is 139  Hyperphosphatemia -Likely due to renal failure, improved and phosphorus is now 3.3 -Treatment per nephrology  Generalized weakness with debility -PT and OT as above recommending SNF  Chronic pain -Continue scheduled Tylenol, avoid sedating medications -Check LFTs given that he has been on scheduled Tylenol   GERD -PPI has been held in the setting of above and will not resume  Gallbladder Thickening -CT A/P: Her wall thickening, without evidence of gallstones. -Patient with no abdominal pain on exam  LA Thrombus/atrial appendage thrombus -Was on Eliquis PTA but held in anticipation of Renal Bx -Last dose of Eliquis was 3/17 -Transitioned to Heparin gtt and currently remains on it we will transition back as he likely has no further invasive testing needed -Bone marrow biopsy was done yesterday  DVT prophylaxis: Heparin GTT Code Status: FULL CODE  Family Communication: No family present at bedside Disposition Plan: Patient was admitted from home for metabolic encephalopathy along with an AKI.  He underwent a renal biopsy and a bone marrow biopsy which is concerning for multiple myeloma.  Continues to have anemia and has been transfused PRBCs.  Patient underwent a bone marrow biopsy yesterday morning and will likely go to SNF once medically stable cleared by nephrology as well as oncology; oncology feels the patient no longer needs to be in the hospital and likely can be discharged from their perspective -We will need to make sure the patient is stable from nephrology perspective to discharge  Consultants:   Medical Oncology  Nephrology  Interventional Radiology    Procedures:   Renal ultrasound Ultra sound guided biopsy the left kidney:  Bone Marrow biopsy   Antimicrobials:  Anti-infectives (From admission, onward)   Start     Dose/Rate Route Frequency Ordered Stop   05/13/19 1045  cefTRIAXone (ROCEPHIN) 1 g in sodium chloride 0.9 % 100 mL IVPB  Status:  Discontinued     1 g 200 mL/hr over 30 Minutes Intravenous Every 24 hours 05/13/19 1036 05/18/19 1024   05/08/19 0130  cefTRIAXone (ROCEPHIN) 1 g in sodium chloride 0.9 % 100 mL IVPB  Status:  Discontinued     1 g 200 mL/hr over 30 Minutes Intravenous Daily at bedtime 05/08/19 0100 05/09/19 6283     Subjective: Patient was seen and examined at bedside and he was resting and denied any complaints of pain.  No nausea or lightheadedness or dizziness.  No other concerns or complaints at this time  Objective: Vitals:   05/22/19 1801 05/22/19 2010 05/23/19 0459 05/23/19 1651  BP: 134/78 140/80 131/85 128/68  Pulse: 67 68 74 74  Resp: 20 18 16 18   Temp: 97.7 F (36.5 C) 98.2 F (36.8 C) 99 F (37.2 C) 98.8 F (37.1 C)  TempSrc:  Oral Oral Oral  SpO2: 99% 98% 94% 100%  Weight:  104 kg    Height:        Intake/Output Summary (Last 24 hours) at 05/23/2019 1842 Last data filed at 05/23/2019 1800 Gross per 24 hour  Intake 1395.91 ml  Output 1075 ml  Net 320.91 ml   Filed Weights   05/21/19 0439 05/21/19 2054 05/22/19 2010  Weight: 102.2 kg 103.2 kg 104 kg   Examination: Physical Exam:  Constitutional: WN/WD African-American male currently in no acute distress and is calm and is  resting Eyes: Lids and conjunctivae normal, sclerae anicteric  ENMT: External Ears, Nose appear normal. Grossly normal hearing.  Neck: Appears normal, supple, no cervical masses, normal ROM, no appreciable thyromegaly no JVD Respiratory: Diminished to auscultation bilaterally, no wheezing, rales, rhonchi or crackles. Normal respiratory effort and patient is not tachypenic. No accessory muscle use.  Unlabored  breathing Cardiovascular: RRR, no murmurs / rubs / gallops. S1 and S2 auscultated.  Minimal extremity edema.   Abdomen: Soft, non-tender, non-distended. Bowel sounds positive.  GU: Deferred.  Foley catheter in place with amber color urine but no appreciable blood Musculoskeletal: No clubbing / cyanosis of digits/nails. No joint deformity upper and lower extremities.  Is tender palpation to the lower extremities Skin: No rashes, lesions, ulcers on limited skin evaluation but has some skin discoloration in the lower extremities. No induration; Warm and dry.  Neurologic: CN 2-12 grossly intact with no focal deficits. Romberg sign and cerebellar reflexes not assessed.  Psychiatric: Impaired judgment and insight.  Slightly somnolent and drowsy but easily arousable; normal Sherman and appropriate affect.   Data Reviewed: I have personally reviewed following labs and imaging studies  CBC: Recent Labs  Lab 05/19/19 0542 05/19/19 0542 05/20/19 0012 05/20/19 0807 05/21/19 0507 05/22/19 0338 05/23/19 0434  WBC 8.6  --  9.6  --  9.3 7.6 8.3  NEUTROABS  --   --   --   --   --  4.8 5.4  HGB 7.4*   < > 6.7* 8.7* 7.9* 7.5* 7.5*  HCT 23.3*   < > 21.1* 26.7* 25.0* 24.3* 24.2*  MCV 90.0  --  88.3  --  89.9 89.3 90.6  PLT 312  --  317  --  356 356 386   < > = values in this interval not displayed.   Basic Metabolic Panel: Recent Labs  Lab 05/19/19 0542 05/20/19 0012 05/21/19 0507 05/22/19 0338 05/23/19 0434  NA 138 137 137 137 138  139  K 3.9 3.7 4.7 4.2 4.5  4.5  CL 106 109 108 106 107  108  CO2 21* 19* 22 21* 23  22  GLUCOSE 94 153* 98 89 102*  100*  BUN 34* 35* 32* 31* 36*  36*  CREATININE 2.45* 2.44* 2.16* 1.94* 1.79*  1.78*  CALCIUM 8.2* 7.9* 8.1* 8.0* 8.1*  8.1*  MG  --   --   --  2.2 2.1  PHOS 4.2 3.6 4.0 3.4 3.3  3.3   GFR: Estimated Creatinine Clearance: 48.7 mL/min (A) (by C-G formula based on SCr of 1.78 mg/dL (H)). Liver Function Tests: Recent Labs  Lab  05/17/19 0122 05/18/19 0249 05/19/19 0542 05/20/19 0012 05/21/19 0507 05/22/19 0338 05/23/19 0434  AST 72*  --   --   --   --  61* 56*  ALT 36  --   --   --   --  45* 43  ALKPHOS 247*  --   --   --   --  246* 228*  BILITOT 0.9  --   --   --   --  0.8 1.0  PROT 6.7  --   --   --   --  6.4* 6.6  ALBUMIN 1.8*   < > 1.6* 1.5* 1.7* 1.7* 1.7*  1.7*   < > = values in this interval not displayed.   No results for input(s): LIPASE, AMYLASE in the last 168 hours. No results for input(s): AMMONIA in the last 168 hours. Coagulation Profile: Recent Labs  Lab 05/19/19 0542  INR 1.4*   Cardiac Enzymes: No results for input(s): CKTOTAL, CKMB, CKMBINDEX, TROPONINI in the last 168 hours. BNP (last 3 results) No results for input(s): PROBNP in the last 8760 hours. HbA1C: No results for input(s): HGBA1C in the last 72 hours. CBG: No results for input(s): GLUCAP in the last 168 hours. Lipid Profile: No results for input(s): CHOL, HDL, LDLCALC, TRIG, CHOLHDL, LDLDIRECT in the last 72 hours. Thyroid Function Tests: No results for input(s): TSH, T4TOTAL, FREET4, T3FREE, THYROIDAB in the last 72 hours. Anemia Panel: No results for input(s): VITAMINB12, FOLATE, FERRITIN, TIBC, IRON, RETICCTPCT in the last 72 hours. Sepsis Labs: No results for input(s): PROCALCITON, LATICACIDVEN in the last 168 hours.  Recent Results (from the past 240 hour(s))  Culture, Urine     Status: None   Collection Time: 05/13/19  6:58 PM   Specimen: Urine, Catheterized  Result Value Ref Range Status   Specimen Description URINE, CATHETERIZED  Final   Special Requests NONE  Final   Culture   Final    NO GROWTH Performed at [facility], [address], [place], [place] [address]    Report Status 05/14/2019 FINAL  Final     RN Pressure Injury Documentation:     Estimated body mass index is 27.19 kg/m as calculated from the following:   Height as of this encounter: 6' 5"  (1.956 m).   Weight as of this  encounter: 104 kg.  Malnutrition Type:  Nutrition Problem: Inadequate oral intake Etiology: acute illness, poor appetite   Malnutrition Characteristics:  Signs/Symptoms: meal completion < 25%   Nutrition Interventions:  Interventions: Magic cup, Refer to RD note for recommendations, Liberalize Diet, Nepro shake   Radiology Studies: CT BONE MARROW BIOPSY & ASPIRATION  Result Date: 05/22/2019 CLINICAL DATA:  Elevated M spike and light chains EXAM: CT GUIDED DEEP ILIAC BONE ASPIRATION AND CORE BIOPSY TECHNIQUE: Patient was placed prone on the CT gantry and limited axial scans through the pelvis were obtained. Appropriate skin entry site was identified. Skin site was marked, prepped with chlorhexidine, draped in usual sterile fashion, and infiltrated locally with 1% lidocaine. Intravenous Fentanyl 123mg and Versed 210mwere administered as conscious sedation during continuous monitoring of the patient's level of consciousness and physiological / cardiorespiratory status by the radiology RN, with a total moderate sedation time of 10 minutes. Under CT fluoroscopic guidance an 11-gauge Cook trocar bone needle was advanced into the right iliac bone just lateral to the sacroiliac joint. Once needle tip position was confirmed, core and aspiration samples were obtained, submitted to pathology for approval. Post procedure scans show no hematoma or fracture. Patient tolerated procedure well. COMPLICATIONS: COMPLICATIONS none IMPRESSION: 1. Technically successful CT guided right iliac bone core and aspiration biopsy. Electronically Signed   By: D Lucrezia Europe.D.   On: 05/22/2019 15:21   Scheduled Meds: . atorvastatin  80 mg Oral q1800  . Chlorhexidine Gluconate Cloth  6 each Topical Daily  . feeding supplement (ENSURE ENLIVE)  237 mL Oral BID BM  . feeding supplement (PRO-STAT SUGAR FREE 64)  30 mL Oral QID  . influenza vaccine adjuvanted  0.5 mL Intramuscular Tomorrow-1000  . levothyroxine  100 mcg Oral  Q0600  . pneumococcal 23 valent vaccine  0.5 mL Intramuscular Tomorrow-1000  . sodium bicarbonate  650 mg Oral TID  . vitamin B-12  1,000 mcg Oral Daily   Continuous Infusions: . sodium chloride 500 mL (05/21/19 1938)  . heparin 700 Units/hr (05/22/19  1351)    LOS: 15 days   Kerney Elbe, DO [facility] PAGER is on AMION  If 7PM-7AM, please contact night-coverage [email or web address]

## 2019-05-24 LAB — HEPARIN LEVEL (UNFRACTIONATED): Heparin Unfractionated: 0.23 IU/mL — ABNORMAL LOW (ref 0.30–0.70)

## 2019-05-24 LAB — CBC WITH DIFFERENTIAL/PLATELET
Abs Immature Granulocytes: 0.03 10*3/uL (ref 0.00–0.07)
Basophils Absolute: 0.1 10*3/uL (ref 0.0–0.1)
Basophils Relative: 1 %
Eosinophils Absolute: 0.2 10*3/uL (ref 0.0–0.5)
Eosinophils Relative: 3 %
HCT: 24.1 % — ABNORMAL LOW (ref 39.0–52.0)
Hemoglobin: 7.5 g/dL — ABNORMAL LOW (ref 13.0–17.0)
Immature Granulocytes: 0 %
Lymphocytes Relative: 23 %
Lymphs Abs: 1.6 10*3/uL (ref 0.7–4.0)
MCH: 28.2 pg (ref 26.0–34.0)
MCHC: 31.1 g/dL (ref 30.0–36.0)
MCV: 90.6 fL (ref 80.0–100.0)
Monocytes Absolute: 0.7 10*3/uL (ref 0.1–1.0)
Monocytes Relative: 10 %
Neutro Abs: 4.5 10*3/uL (ref 1.7–7.7)
Neutrophils Relative %: 63 %
Platelets: 402 10*3/uL — ABNORMAL HIGH (ref 150–400)
RBC: 2.66 MIL/uL — ABNORMAL LOW (ref 4.22–5.81)
RDW: 17.6 % — ABNORMAL HIGH (ref 11.5–15.5)
WBC: 7.1 10*3/uL (ref 4.0–10.5)
nRBC: 0 % (ref 0.0–0.2)

## 2019-05-24 LAB — COMPREHENSIVE METABOLIC PANEL
ALT: 46 U/L — ABNORMAL HIGH (ref 0–44)
AST: 54 U/L — ABNORMAL HIGH (ref 15–41)
Albumin: 1.7 g/dL — ABNORMAL LOW (ref 3.5–5.0)
Alkaline Phosphatase: 197 U/L — ABNORMAL HIGH (ref 38–126)
Anion gap: 9 (ref 5–15)
BUN: 35 mg/dL — ABNORMAL HIGH (ref 8–23)
CO2: 24 mmol/L (ref 22–32)
Calcium: 8.3 mg/dL — ABNORMAL LOW (ref 8.9–10.3)
Chloride: 105 mmol/L (ref 98–111)
Creatinine, Ser: 1.71 mg/dL — ABNORMAL HIGH (ref 0.61–1.24)
GFR calc Af Amer: 46 mL/min — ABNORMAL LOW (ref >60)
GFR calc non Af Amer: 40 mL/min — ABNORMAL LOW (ref >60)
Glucose, Bld: 96 mg/dL (ref 70–99)
Potassium: 4.6 mmol/L (ref 3.5–5.1)
Sodium: 138 mmol/L (ref 135–145)
Total Bilirubin: 0.9 mg/dL (ref 0.3–1.2)
Total Protein: 6.5 g/dL (ref 6.5–8.1)

## 2019-05-24 LAB — PHOSPHORUS: Phosphorus: 3.6 mg/dL (ref 2.5–4.6)

## 2019-05-24 LAB — RENAL FUNCTION PANEL
Albumin: 1.7 g/dL — ABNORMAL LOW (ref 3.5–5.0)
Anion gap: 8 (ref 5–15)
BUN: 36 mg/dL — ABNORMAL HIGH (ref 8–23)
CO2: 24 mmol/L (ref 22–32)
Calcium: 8.5 mg/dL — ABNORMAL LOW (ref 8.9–10.3)
Chloride: 106 mmol/L (ref 98–111)
Creatinine, Ser: 1.71 mg/dL — ABNORMAL HIGH (ref 0.61–1.24)
GFR calc Af Amer: 46 mL/min — ABNORMAL LOW (ref >60)
GFR calc non Af Amer: 40 mL/min — ABNORMAL LOW (ref >60)
Glucose, Bld: 93 mg/dL (ref 70–99)
Phosphorus: 3.4 mg/dL (ref 2.5–4.6)
Potassium: 4.4 mmol/L (ref 3.5–5.1)
Sodium: 138 mmol/L (ref 135–145)

## 2019-05-24 LAB — MAGNESIUM: Magnesium: 2.1 mg/dL (ref 1.7–2.4)

## 2019-05-24 MED ORDER — TICAGRELOR 90 MG PO TABS
90.0000 mg | ORAL_TABLET | Freq: Two times a day (BID) | ORAL | Status: DC
  Administered 2019-05-24 – 2019-05-26 (×5): 90 mg via ORAL
  Filled 2019-05-24 (×5): qty 1

## 2019-05-24 MED ORDER — APIXABAN 5 MG PO TABS
5.0000 mg | ORAL_TABLET | Freq: Two times a day (BID) | ORAL | Status: DC
  Administered 2019-05-24 – 2019-05-26 (×5): 5 mg via ORAL
  Filled 2019-05-24 (×5): qty 1

## 2019-05-24 NOTE — TOC Progression Note (Addendum)
Transition of Care [facility]) - Progression Note    Patient Details  Name: Jeremy Sherman MRN: [id number] Date of Birth: 1950-09-04  [facility] CM/SW Contact  Bartholomew Crews, RN Phone Number: [phone] 05/24/2019, 3:57 PM  Clinical Narrative:    Notified by MD of patient's readiness to transition home. Spoke with liaison at [facility] to schedule delivery of hospital bed. Liaison to f/u with availability for delivery. Spoke with liaison for [facility]. Liaison to follow up with ability to accept referral. Requested HH orders for RN, PT, OT. Left voicemail for wife to discuss transition plans.   Update: Confirmed with [facility] that hospital bed is confirmed for delivery on Monday. Home Health has been arranged with [facility] for RN, PT, OT. MD aware.   TOC team following for transition needs.       Expected Discharge Plan and Services                                                 Social Determinants of Health (SDOH) Interventions    Readmission Risk Interventions No flowsheet data found.

## 2019-05-24 NOTE — Progress Notes (Signed)
ANTICOAGULATION CONSULT NOTE - Follow Up Consult  Pharmacy Consult for Heparin (Eliquis on hold) Indication: LA thrombus 03/20/19  No Known Allergies  Patient Measurements: Height: 6' 5" (195.6 cm) Weight: 229 lb 4.5 oz (104 kg) IBW/kg (Calculated) : 89.1 Heparin Dosing Weight: 98 kg  Vital Signs: Temp: 97.5 F (36.4 C) (03/27 0937) Temp Source: Oral (03/27 0937) BP: 141/77 (03/27 0937) Pulse Rate: 65 (03/27 0937)  Labs: Recent Labs    05/22/19 0338 05/22/19 0338 05/22/19 2159 05/23/19 0434 05/24/19 0724  HGB 7.5*   < >  --  7.5* 7.5*  HCT 24.3*  --   --  24.2* 24.1*  PLT 356  --   --  386 402*  APTT 106*  --   --   --   --   HEPARINUNFRC 0.58   < > 0.38 0.35 0.23*  CREATININE 1.94*  --   --  1.79*  1.78* 1.71*  1.71*   < > = values in this interval not displayed.    Estimated Creatinine Clearance: 50.7 mL/min (A) (by C-G formula based on SCr of 1.71 mg/dL (H)).  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 Pharmacy consulted to begin heparin while Eliquis held in anticipation of renal biopsy. Last dose of Eliquis was on 3/17 PM.  CT negative for abdominal bleed on 05/18/19.     Heparin held for renal biopsy on 3/22 then resumed post-procedure.    Also held for bone marrow biopsy on 3/25 then resumed 4 hours post-procedure per low risk guidelines.  Heparin is slightly subtherapeutic this AM at 0.23. S/p renal and bone marrow bx. Messaging MD to resume apixaban and brilinta. - Ok per Dr. Sheikh   Goal of Therapy:  Heparin level 0.3-0.7 units/ml Monitor platelets by anticoagulation protocol: Yes   Plan:   Dc heparin and level Resume apixaban 5mg PO BID Resume Brilinta 90mg PO BID  Minh Pham, PharmD, BCIDP, AAHIVP, CPP Infectious Disease Pharmacist 05/24/2019 10:55 AM    

## 2019-05-24 NOTE — Plan of Care (Signed)
  Problem: Health Behavior/Discharge Planning: Goal: Ability to manage health-related needs will improve Outcome: Progressing   Problem: Urinary Elimination: Goal: Progression of disease will be identified and treated Outcome: Progressing

## 2019-05-24 NOTE — Progress Notes (Signed)
Patient ID: Jeremy Sherman, male   DOB: 31-Dec-1950, 69 y.o.   MRN: [id number] [facility] Progress Note   Assessment/ Plan:   1. Acute kidney Injury: Nonoliguric with creatinine remaining roughly around 1.7 overnight.  He does not have any acute electrolyte abnormality, significant volume overload or uremic signs or symptoms to indicate dialysis at this time and I will discontinue sodium bicarbonate.  Concern raised for possible MGRS and chronic active interstitial nephritis following which PPI was stopped.  2.  Elevated M spike/kappa light chains.  Concern for plasma cell dyscrasia/multiple myeloma with negative bone survey on 05/20/2019.  Awaiting bone marrow biopsy results on which additional management will be directed by hematology. 3.  Hypertension: Blood pressures marginally elevated status post IV fluids, continue to monitor for antihypertensive therapy. 4.  Anemia: With evidence of iron deficiency status post PRBC and intravenous iron earlier.  No overt loss and will await bone marrow biopsy results prior to ESA. 5.  History of recent CVA: Anticoagulation therapy/secondary prophylactic therapy on hold to allow for procedures.  I will sign off at this time, he is scheduled to follow-up with Dr. Hollie Salk on 06/20/2019.  Subjective:   Denies any chest pain or shortness of breath and still having intermittent leg pain/discomfort over back.   Objective:   BP (!) 141/77 (BP Location: Left Arm)   Pulse 65   Temp (!) 97.5 F (36.4 C) (Oral)   Resp 16   Ht 6' 5"  (1.956 m)   Wt 104 kg   SpO2 100%   BMI 27.19 kg/m   Intake/Output Summary (Last 24 hours) at 05/24/2019 1032 Last data filed at 05/24/2019 7867 Gross per 24 hour  Intake 1313.24 ml  Output 1200 ml  Net 113.24 ml   Weight change:   Physical Exam: Gen: Resting comfortably in bed, awakens to calling out his name with minimal verbal interactions. CVS: Pulse regular rhythm, normal S1 and S2 with ESM Resp:  Clear to auscultation, no rales/rhonchi Abd: Soft, obese, nontender Ext: Trace to 1+ bilateral lower extremity edema with tenderness and some skin scabs.   Imaging: No results found.  Labs: BMET Recent Labs  Lab 05/18/19 0249 05/19/19 0542 05/20/19 0012 05/21/19 0507 05/22/19 0338 05/23/19 0434 05/24/19 0724  NA 135 138 137 137 137 138  139 138  138  K 4.0 3.9 3.7 4.7 4.2 4.5  4.5 4.6  4.4  CL 106 106 109 108 106 107  108 105  106  CO2 19* 21* 19* 22 21* 23  22 24  24   GLUCOSE 98 94 153* 98 89 102*  100* 96  93  BUN 36* 34* 35* 32* 31* 36*  36* 35*  36*  CREATININE 2.67* 2.45* 2.44* 2.16* 1.94* 1.79*  1.78* 1.71*  1.71*  CALCIUM 7.8* 8.2* 7.9* 8.1* 8.0* 8.1*  8.1* 8.3*  8.5*  PHOS 3.8 4.2 3.6 4.0 3.4 3.3  3.3 3.6  3.4   CBC Recent Labs  Lab 05/21/19 0507 05/22/19 0338 05/23/19 0434 05/24/19 0724  WBC 9.3 7.6 8.3 7.1  NEUTROABS  --  4.8 5.4 4.5  HGB 7.9* 7.5* 7.5* 7.5*  HCT 25.0* 24.3* 24.2* 24.1*  MCV 89.9 89.3 90.6 90.6  PLT 356 356 386 402*    Medications:    . atorvastatin  80 mg Oral q1800  . Chlorhexidine Gluconate Cloth  6 each Topical Daily  . feeding supplement (ENSURE ENLIVE)  237 mL Oral BID BM  . feeding supplement (PRO-STAT SUGAR FREE  64)  30 mL Oral QID  . influenza vaccine adjuvanted  0.5 mL Intramuscular Tomorrow-1000  . levothyroxine  100 mcg Oral Q0600  . pneumococcal 23 valent vaccine  0.5 mL Intramuscular Tomorrow-1000  . sodium bicarbonate  650 mg Oral TID  . vitamin B-12  1,000 mcg Oral Daily   Elmarie Shiley, MD 05/24/2019, 10:32 AM

## 2019-05-24 NOTE — Plan of Care (Signed)
  Problem: Education: Goal: Knowledge of General Education information will improve Description Including pain rating scale, medication(s)/side effects and non-pharmacologic comfort measures Outcome: Progressing   

## 2019-05-24 NOTE — Progress Notes (Addendum)
PROGRESS NOTE    Andreus Cure Dutson  IDP:[id number] DOB: 1951-02-14 DOA: 05/07/2019 PCP: Imagene Riches, NP  Brief Narrative:  HPI On 05/08/2019 by Dr. Shela Leff [name] medical history significant ofCAD status post CABG, hypertension, hyperlipidemia, hypothyroidism, prediabetes, recent hospital admission for stroke,carotid artery disease status post stent on Eliquis and Brilinta, left atrial appendage thrombus presenting to the ED for evaluation of abnormal labs. Patient was called by his physician after outpatient labs revealed worsening renal failure.Patient appears confused and is not sure why he is here. He has no complaints. Denies fevers, chills, chest pain, cough, shortness of breath, nausea, vomiting, abdominal pain, diarrhea, flank pain, or dysuria. No family available at this time. Per ED provider's conversation with the patient's wife since his discharge from rehab 2 weeks ago he has had slowly worsening confusion. He has an indwelling Foley catheter since his prior hospitalization which has been draining urine.  Interim history Patient found to have acute kidney injury and nephrology consulted and following.  Also developed fever with worsening leukocytosis, work-up thus far has been unremarkable and he continues to spike intermittent temperatures with a T-max of 100.4 yesterday.  There was initial concern of CAUTI when he was first admitted however repeat urine cultures show no growth.  Patient underwent a renal biopsy on 05/19/2019 with results still pending.  Because of the elevated M spike he is undergoing a multiple myeloma work-up and received a bone marrow biopsy yesterday morning given that his bone survey is negative.  Preliminary bone marrow biopsy findings showed increased plasma cells that are atypical consistent with a diagnosis of multiple myeloma but the immunohistochemical stains are pending to establish  tonality and the oncologist does suspect the patient has multiple myeloma.  They feel that he is stable from a hematologic standpoint and that his renal function is improved and stabilized and await on final bone marrow biopsy result prior to initiating systemic therapy and patient is likely to be referred to the [facility] cancer center.  Patient's renal failure is improving and given the downtrending creatinine prednisone was not used and they were recommending continue sodium bicarbonate for metabolic acidosis but this is now been stopped.  Patient has been transitioned back to his home Brilinta as well as Eliquis and he is stable to be discharged as he has been cleared from a nephrology and oncology perspective.  Patient will follow up with Dr. Bobby Rumpf in [place] for his likely multiple myeloma.  Unfortunately patient cannot be discharged today given that he will require a hospital bed and this cannot be delivered until at least Monday.  Patient's family cannot send the patient to SNF at this time due to financial reasons and will take the patient home.  We will try maximize his home health services prior to discharge and anticipated discharge is Monday, 05/26/2019  Assessment & Plan:   Principal Problem:   AKI (acute kidney injury) ([place]) Active Problems:   Essential hypertension   UTI (urinary tract infection) due to urinary indwelling catheter ([place])   Acute metabolic encephalopathy   QT prolongation  Acute kidney injury, improving  -Creatinine on admission 4.72 (baseline approximately 1) -BUN/creatinine is trended down is 35/1.71 -Nephrology consulted and appreciated-renal obtained out biopsy -Interventional radiology consulted and appreciated, s/p US guided biopsy of left kidney- biopsy results pending  -Renal ultrasound unremarkable -ANCA negative -Patient was placed on IV fluids which is now stopped -Renal biopsy preliminary showed severe stenosing arteriosclerosis and chronic active  interstitial nephritis -Nephrologhy thinks that this could be PPI induced Intersitial Nephritis -Dr. Johnney Ou has placed a call to [facility] nephro pathology for additional information about possibly starting the patient on steroids per Dr. Posey Pronto feels that it is not indicated at this time given her downtrending creatinine -Continue to monitor CMP in a.m and nephrology feels the patient is stable from their perspective and can be discharged and follow-up in outpatient setting  M spike with concern for multiple myeloma -patient noted to have M spike and pending renal biopsy results -Oncology, Dr. Benay Spice, consulted and appreciated. Recommended serum immunofixation and bone survey; -Serum immunofixation is finally back and showed a total protein ELP of 5.9, and IgG of 1772, and IgM of 168, and an IgA level of 234 -Nephrology is also ordering SPEP and free light chains as well as patient having abnormal kappa lambda free light chain ratio -Bone survey was done and showed "Unremarkable bone survey. No lytic or sclerotic bone lesions are identified." -Patient underwent a bone marrow biopsy yesterday and Dr. Benay Spice reviewed this and preliminary report showed 30% plasma cells which is a not a definitive diagnosis of multiple myeloma but will review with the pathologist in detail tomorrow and to initiate systemic treatment for myeloma if indicated; Dr. Benay Spice discussed with Dr. Barbee Cough of pathology and she indicated that there are increased number of atypical appearing plasma cells consistent with the diagnosis of multiple myeloma however the immunohistochemical stains are still pending to establish clonality and extent of bone marrow involvement -Dr. Malachy Mood does believe the patient has multiple myeloma but feels that he is stable from a hematological standpoint and the renal function has improved and stabilized and they will go to await final bone marrow results prior to initiating systemic chemotherapy and they  have contacted the outpatient [facility] oncologist Dr. Bobby Rumpf -Patient is stable from a hematological perspective to be discharged and will need SNF however patient's family cannot afford SNF financially and have elected to take the patient home with them and cannot take him home until hospital bed is delivered and it will be until Monday when this will happen  Complicated UTI/pyelonephritis/pyuria with hematuria in a patient with chronic indwelling Foley catheter due to urinary retention and BPH -Urine culture initially unremarkable.  Repeat urine culture on 05/13/2019 show no growth  -Foley catheter was changed on 05/13/2019 -Blood cultures show no growth to date -patient was placed on IV ceftriaxone - and completed course during hospitalization -Patient continues to have fever and some vomiting and may need to resume antibiotics but will reculture if he spiked another temperature; likely temperatures are related to multiple myeloma as above -Currently has been afebrile for last 48 hours with a T-max of 40.0  Acute metabolic encephalopathy, continues to be somewhat confused but appears to be at baseline due to his underlying cognitive impairment -Suspect some underlying cognitive impairment -Multifactorial including dehydration, AKI, hyperammonemia, low B12, tramadol use -Appears to be improving, although unsure patient's baseline -patient continues to be very emotionally labile -Continue with delirium precautions -PT OT recommending SNF but unfortunately patient's family cannot afford the SNF due to financial reasons and because of their insurance and I have elected to take the patient home and this will be done Monday after hospital bed is delivered  Normocytic Anemia -Certainly iron deficiency anemia versus anemia of chronic disease -has received 2u PRBC during hospitalization; now hemoglobin/hematocrit is stabilized and was 7.5 24.1 today -Iron saturation 9, B12 142 -Was  given B12 supplementation  along with IV iron and ESA per nephrology; patient has received Feraheme x2 will be in the hospital; ESA is currently being held due to possible myeloma -FOBT negative; urine is slightly blood-tinged but this is improved -Of note, patient on Brilinta and Eliquis-this was held for renal biopsy and bone marrow biopsy and patient was placed on a heparin drip we we will resume his home Brilinta and Eliquis today -CT abdomen pelvis made no mention of bleed -continue monitor blood count carefully and repeat CBC in a.m.  History of CVA -Left CAS/left MCA stenosis status post left CEA and left MCA stent in January 2021.  On Brilinta.  Patient also noted to have a left atrial appendage thrombus was placed on Eliquis. -Currently no focal neuro deficits other than lower extremity bilateral weakness.  Patient was discharged home from rehab on 04/24/2019. -Continue Brilinta, Eliquis, statin now and will transition off of the heparin drip-PT, OT recommending SNF anticipating discharging the next 24 to 48 hours -May need to hold statin if LFTs continue to worsen  History of coronary artery disease -Patient with CABG in 2019 -Currently stable, no complaints of chest pain -continue with Brilinta when able  Essential hypertension -Currently on no meds, will add on meds as needed  Hypothyroidism -Continue Synthroid -TSH 6.5- however this was a hemolyzed sample  Metabolic acidosis  -Improved and his CO2 is now 24 with a chloride level of 106 and anion gap of 8 -Stopped bicarbonate 650 mg p.o. 3 times daily for now  Hyponatremia -Resolved sodium is 139  Hyperphosphatemia -Likely due to renal failure, improved and phosphorus is now 3.6 -Treatment per nephrology and now they have signed off the case  Generalized weakness with debility -PT and OT as above recommending SNF but family will take the patient home with home health  Chronic pain -Continued scheduled Tylenol  but will stop scheduling it given LFT Elevation and now back on q6hprn -Avoid sedating medications -Check LFTs given that he has been on scheduled Tylenol and LFTs are slightly elevated with an AST of 54 and ALT of 46.   GERD -PPI has been held in the setting of above and will not resume  Gallbladder Thickening -CT A/P: Her wall thickening, without evidence of gallstones. -AST and ALT is slightly elevated but improved from a few days ago; AST was elevated to 72 is now 54 and ALT is now 39 -Patient with no abdominal pain on exam  LA Thrombus/atrial appendage thrombus -Was on Eliquis PTA but held in anticipation of Renal Bx -Last dose of Eliquis was 3/17 before switching to IV heparin -Transitioned to Heparin gtt and currently remains on it we will transition back today as he likely has no further invasive testing needed -Bone marrow biopsy was done the day before yesterday  DVT prophylaxis: Heparin GTT changed back to Eliquis  Code Status: FULL CODE  Family Communication: No family present at bedside I spoke with the wife over the telephone Disposition Plan: Patient was admitted from home for metabolic encephalopathy along with an AKI.  He underwent a renal biopsy and a bone marrow biopsy which is concerning for multiple myeloma.  Continues to have anemia and has been transfused PRBCs.  Patient underwent a bone marrow biopsy the day before yesterday and was likely to go to SNF once medically stable cleared by nephrology as well as oncology but family has now elected to take him home; oncology feels the patient no longer needs to be in the hospital and  likely can be discharged from their perspective and nephrology today sign off the case; currently cannot safely discharge home until hospital bed is delivered and this is likely to be done Monday from my discussion with the case management team.  Consultants:   Medical Oncology  Nephrology  Interventional Radiology    Procedures:  Renal  ultrasound Ultra sound guided biopsy the left kidney:  Bone Marrow biopsy   Antimicrobials:  Anti-infectives (From admission, onward)   Start     Dose/Rate Route Frequency Ordered Stop   05/13/19 1045  cefTRIAXone (ROCEPHIN) 1 g in sodium chloride 0.9 % 100 mL IVPB  Status:  Discontinued     1 g 200 mL/hr over 30 Minutes Intravenous Every 24 hours 05/13/19 1036 05/18/19 1024   05/08/19 0130  cefTRIAXone (ROCEPHIN) 1 g in sodium chloride 0.9 % 100 mL IVPB  Status:  Discontinued     1 g 200 mL/hr over 30 Minutes Intravenous Daily at bedtime 05/08/19 0100 05/09/19 2831     Subjective: Patient was seen and examined at bedside and he denies any complaints today and no nausea or vomiting.  States that he felt good and he states that he is "liked it here".  He denies any chest pain, lightheadedness or dizziness.  No other concerns or claims at this time but had some painful palpation on the leg since then started crying.  Updated wife via the telephone and she is agreeable to take the patient home  Objective: Vitals:   05/23/19 1651 05/23/19 2024 05/24/19 0512 05/24/19 0937  BP: 128/68 128/82 (!) 148/80 (!) 141/77  Pulse: 74 72 67 65  Resp: _0 Temp: 98.8 F (37.1 C) 99.3 F (37.4 C) 98.5 F (36.9 C) (!) 97.5 F (36.4 C)  TempSrc: Oral Oral Oral Oral  SpO2: 100% 95% 97% 100%  Weight:      Height:        Intake/Output Summary (Last 24 hours) at 05/24/2019 1507 Last data filed at 05/24/2019 1400 Gross per 24 hour  Intake 1396.91 ml  Output 700 ml  Net 696.91 ml   Filed Weights   05/21/19 0439 05/21/19 2054 05/22/19 2010  Weight: 102.2 kg 103.2 kg 104 kg   Examination: Physical Exam:  Constitutional: WN/WD in no acute distress and is calm and he is awake today not complain of any pain Eyes: Lids and conjunctivae normal, sclerae anicteric  ENMT: External Ears, Nose appear normal. Grossly normal hearing.  Neck: Appears normal, supple, no cervical masses, normal ROM, no  appreciable thyromegaly neck: No JVD Respiratory: Diminished to auscultation bilaterally, no wheezing, rales, rhonchi or crackles. Normal respiratory effort and patient is not tachypenic. No accessory muscle use.  Unlabored breathing Cardiovascular: RRR, no murmurs / rubs / gallops. S1 and S2 auscultated.  Trace lower extremity edema and Abdomen: Soft, non-tender, distended secondary body habitus. Bowel sounds positive.  GU: Deferred.  Chronic Foley catheter in place with amber color but no appreciable blood Musculoskeletal: No clubbing / cyanosis of digits/nails. No joint deformity upper and lower extremities are painful palpation of the lower extremities Skin: No rashes, lesions, ulcers on limited skin evaluation. No induration; Warm and dry.  Neurologic: CN 2-12 grossly intact with no focal deficits. Romberg sign and cerebellar reflexes not assessed.  Psychiatric: Impaired judgment and insight. Alert and awake. Anxious and Tearful mood and appropriate affect.   Data Reviewed: I have personally reviewed following labs and imaging studies  CBC: Recent Labs  Lab 05/20/19 0012  05/20/19 0012 05/20/19 6283 05/21/19 0507 05/22/19 0338 05/23/19 0434 05/24/19 0724  WBC 9.6  --   --  9.3 7.6 8.3 7.1  NEUTROABS  --   --   --   --  4.8 5.4 4.5  HGB 6.7*   < > 8.7* 7.9* 7.5* 7.5* 7.5*  HCT 21.1*   < > 26.7* 25.0* 24.3* 24.2* 24.1*  MCV 88.3  --   --  89.9 89.3 90.6 90.6  PLT 317  --   --  356 356 386 402*   < > = values in this interval not displayed.   Basic Metabolic Panel: Recent Labs  Lab 05/20/19 0012 05/21/19 0507 05/22/19 0338 05/23/19 0434 05/24/19 0724  NA 137 137 137 138  139 138  138  K 3.7 4.7 4.2 4.5  4.5 4.6  4.4  CL 109 108 106 107  108 105  106  CO2 19* 22 21* _0 GLUCOSE 153* 98 89 102*  100* 96  93  BUN 35* 32* 31* 36*  36* 35*  36*  CREATININE 2.44* 2.16* 1.94* 1.79*  1.78* 1.71*  1.71*  CALCIUM 7.9* 8.1* 8.0* 8.1*  8.1* 8.3*  8.5*  MG   --   --  2.2 2.1 2.1  PHOS 3.6 4.0 3.4 3.3  3.3 3.6  3.4   GFR: Estimated Creatinine Clearance: 50.7 mL/min (A) (by C-G formula based on SCr of 1.71 mg/dL (H)). Liver Function Tests: Recent Labs  Lab 05/20/19 0012 05/21/19 0507 05/22/19 0338 05/23/19 0434 05/24/19 0724  AST  --   --  61* 56* 54*  ALT  --   --  45* 43 46*  ALKPHOS  --   --  246* 228* 197*  BILITOT  --   --  0.8 1.0 0.9  PROT  --   --  6.4* 6.6 6.5  ALBUMIN 1.5* 1.7* 1.7* 1.7*  1.7* 1.7*  1.7*   No results for input(s): LIPASE, AMYLASE in the last 168 hours. No results for input(s): AMMONIA in the last 168 hours. Coagulation Profile: Recent Labs  Lab 05/19/19 0542  INR 1.4*   Cardiac Enzymes: No results for input(s): CKTOTAL, CKMB, CKMBINDEX, TROPONINI in the last 168 hours. BNP (last 3 results) No results for input(s): PROBNP in the last 8760 hours. HbA1C: No results for input(s): HGBA1C in the last 72 hours. CBG: No results for input(s): GLUCAP in the last 168 hours. Lipid Profile: No results for input(s): CHOL, HDL, LDLCALC, TRIG, CHOLHDL, LDLDIRECT in the last 72 hours. Thyroid Function Tests: No results for input(s): TSH, T4TOTAL, FREET4, T3FREE, THYROIDAB in the last 72 hours. Anemia Panel: No results for input(s): VITAMINB12, FOLATE, FERRITIN, TIBC, IRON, RETICCTPCT in the last 72 hours. Sepsis Labs: No results for input(s): PROCALCITON, LATICACIDVEN in the last 168 hours.  No results found for this or any previous visit (from the past 240 hour(s)).   RN Pressure Injury Documentation:     Estimated body mass index is 27.19 kg/m as calculated from the following:   Height as of this encounter: _1  (1.956 m).   Weight as of this encounter: 104 kg.  Malnutrition Type:  Nutrition Problem: Inadequate oral intake Etiology: acute illness, poor appetite   Malnutrition Characteristics:  Signs/Symptoms: meal completion < 25%   Nutrition Interventions:  Interventions: Magic cup,  Refer to RD note for recommendations, Liberalize Diet, Nepro shake   Radiology Studies: No results found. Scheduled Meds: . apixaban  5 mg  Oral BID  . atorvastatin  80 mg Oral q1800  . Chlorhexidine Gluconate Cloth  6 each Topical Daily  . feeding supplement (ENSURE ENLIVE)  237 mL Oral BID BM  . feeding supplement (PRO-STAT SUGAR FREE 64)  30 mL Oral QID  . influenza vaccine adjuvanted  0.5 mL Intramuscular Tomorrow-1000  . levothyroxine  100 mcg Oral Q0600  . pneumococcal 23 valent vaccine  0.5 mL Intramuscular Tomorrow-1000  . ticagrelor  90 mg Oral BID  . vitamin B-12  1,000 mcg Oral Daily   Continuous Infusions: . sodium chloride Stopped (05/24/19 1147)    LOS: 16 days   Kerney Elbe, DO [facility] PAGER is on AMION  If 7PM-7AM, please contact night-coverage [email or web address]

## 2019-05-25 LAB — CBC WITH DIFFERENTIAL/PLATELET
Abs Immature Granulocytes: 0.03 10*3/uL (ref 0.00–0.07)
Basophils Absolute: 0.1 10*3/uL (ref 0.0–0.1)
Basophils Relative: 1 %
Eosinophils Absolute: 0.2 10*3/uL (ref 0.0–0.5)
Eosinophils Relative: 2 %
HCT: 25.1 % — ABNORMAL LOW (ref 39.0–52.0)
Hemoglobin: 7.7 g/dL — ABNORMAL LOW (ref 13.0–17.0)
Immature Granulocytes: 0 %
Lymphocytes Relative: 20 %
Lymphs Abs: 1.5 10*3/uL (ref 0.7–4.0)
MCH: 28.1 pg (ref 26.0–34.0)
MCHC: 30.7 g/dL (ref 30.0–36.0)
MCV: 91.6 fL (ref 80.0–100.0)
Monocytes Absolute: 0.7 10*3/uL (ref 0.1–1.0)
Monocytes Relative: 9 %
Neutro Abs: 5.1 10*3/uL (ref 1.7–7.7)
Neutrophils Relative %: 68 %
Platelets: 443 10*3/uL — ABNORMAL HIGH (ref 150–400)
RBC: 2.74 MIL/uL — ABNORMAL LOW (ref 4.22–5.81)
RDW: 17.7 % — ABNORMAL HIGH (ref 11.5–15.5)
WBC: 7.5 10*3/uL (ref 4.0–10.5)
nRBC: 0 % (ref 0.0–0.2)

## 2019-05-25 LAB — COMPREHENSIVE METABOLIC PANEL
ALT: 43 U/L (ref 0–44)
AST: 55 U/L — ABNORMAL HIGH (ref 15–41)
Albumin: 1.7 g/dL — ABNORMAL LOW (ref 3.5–5.0)
Alkaline Phosphatase: 192 U/L — ABNORMAL HIGH (ref 38–126)
Anion gap: 9 (ref 5–15)
BUN: 38 mg/dL — ABNORMAL HIGH (ref 8–23)
CO2: 22 mmol/L (ref 22–32)
Calcium: 8.2 mg/dL — ABNORMAL LOW (ref 8.9–10.3)
Chloride: 107 mmol/L (ref 98–111)
Creatinine, Ser: 1.46 mg/dL — ABNORMAL HIGH (ref 0.61–1.24)
GFR calc Af Amer: 56 mL/min — ABNORMAL LOW (ref >60)
GFR calc non Af Amer: 48 mL/min — ABNORMAL LOW (ref >60)
Glucose, Bld: 98 mg/dL (ref 70–99)
Potassium: 4.5 mmol/L (ref 3.5–5.1)
Sodium: 138 mmol/L (ref 135–145)
Total Bilirubin: 1 mg/dL (ref 0.3–1.2)
Total Protein: 6.7 g/dL (ref 6.5–8.1)

## 2019-05-25 LAB — MAGNESIUM: Magnesium: 2 mg/dL (ref 1.7–2.4)

## 2019-05-25 LAB — PHOSPHORUS: Phosphorus: 2.5 mg/dL (ref 2.5–4.6)

## 2019-05-25 NOTE — Progress Notes (Signed)
PROGRESS NOTE    Jeremy Sherman  GMW:[id number] DOB: 12-29-1950 DOA: 05/07/2019 PCP: Imagene Riches, NP  Brief Narrative:  HPI On 05/08/2019 by Dr. Shela Leff [name] medical history significant ofCAD status post CABG, hypertension, hyperlipidemia, hypothyroidism, prediabetes, recent hospital admission for stroke,carotid artery disease status post stent on Eliquis and Brilinta, left atrial appendage thrombus presenting to the ED for evaluation of abnormal labs. Patient was called by his physician after outpatient labs revealed worsening renal failure.Patient appears confused and is not sure why he is here. He has no complaints. Denies fevers, chills, chest pain, cough, shortness of breath, nausea, vomiting, abdominal pain, diarrhea, flank pain, or dysuria. No family available at this time. Per ED provider's conversation with the patient's wife since his discharge from rehab 2 weeks ago he has had slowly worsening confusion. He has an indwelling Foley catheter since his prior hospitalization which has been draining urine.  Interim history Patient found to have acute kidney injury and nephrology consulted and following.  Also developed fever with worsening leukocytosis, work-up thus far has been unremarkable and he continues to spike intermittent temperatures with a T-max of 100.4 yesterday.  There was initial concern of CAUTI when he was first admitted however repeat urine cultures show no growth.  Patient underwent a renal biopsy on 05/19/2019 with results still pending.  Because of the elevated M spike he is undergoing a multiple myeloma work-up and received a bone marrow biopsy yesterday morning given that his bone survey is negative.  Preliminary bone marrow biopsy findings showed increased plasma cells that are atypical consistent with a diagnosis of multiple myeloma but the immunohistochemical stains are pending to establish  tonality and the oncologist does suspect the patient has multiple myeloma.  They feel that he is stable from a hematologic standpoint and that his renal function is improved and stabilized and await on final bone marrow biopsy result prior to initiating systemic therapy and patient is likely to be referred to the [facility] cancer center.  Patient's renal failure is improving and given the downtrending creatinine prednisone was not used and they were recommending continue sodium bicarbonate for metabolic acidosis but this is now been stopped.  Patient has been transitioned back to his home Brilinta as well as Eliquis and he is stable to be discharged as he has been cleared from a nephrology and oncology perspective but cannot be discharged until 05/26/19 as his Hospital bed will be delivered then.  Patient will follow up with Dr. Bobby Rumpf in [place] for his likely multiple myeloma.  Unfortunately patient cannot be discharged today given that he will require a hospital bed and this cannot be delivered until at least Monday.  Patient's family cannot send the patient to SNF at this time due to financial reasons and will take the patient home.  We will try maximize his home health services prior to discharge and anticipated discharge is Monday, 05/26/2019 pending no issues.   Assessment & Plan:   Principal Problem:   AKI (acute kidney injury) ([place]) Active Problems:   Essential hypertension   UTI (urinary tract infection) due to urinary indwelling catheter (HCC)   Acute metabolic encephalopathy   QT prolongation  Acute kidney injury, improving  -Creatinine on admission 4.72 (baseline approximately 1) -BUN/creatinine is trended down and is now improved to 38/1.46 -Nephrology consulted and appreciated-renal obtained out biopsy -Interventional radiology consulted and appreciated, s/p US guided biopsy of left kidney- biopsy results pending  -Renal ultrasound unremarkable -  ANCA negative -Patient was placed on  IV fluids which is now stopped -Renal biopsy preliminary showed severe stenosing arteriosclerosis and chronic active interstitial nephritis -Nephrologhy thinks that this could be PPI induced Intersitial Nephritis -Dr. Johnney Ou has placed a call to [facility] nephro pathology for additional information about possibly starting the patient on steroids per Dr. Posey Pronto feels that it is not indicated at this time given her downtrending creatinine -Continued to monitor CMP and nephrology feels the patient is stable from their perspective and can be discharged and follow-up in outpatient setting -Will not Repeat Renal Fxn in AM and can have a repeat CMP within 1 week  M spike with concern for Multiple Myeloma -Patient noted to have M spike and pending renal biopsy results -Oncology, Dr. Benay Spice, consulted and appreciated. Recommended serum immunofixation and bone survey; -Serum immunofixation is finally back and showed a total protein ELP of 5.9, and IgG of 1772, and IgM of 168, and an IgA level of 234 -Nephrology is also ordering SPEP and free light chains as well as patient having abnormal kappa lambda free light chain ratio -Bone survey was done and showed "Unremarkable bone survey. No lytic or sclerotic bone lesions are identified." -Patient underwent a bone marrow biopsy yesterday and Dr. Benay Spice reviewed this and preliminary report showed 30% plasma cells which is a not a definitive diagnosis of multiple myeloma but will review with the pathologist in detail tomorrow and to initiate systemic treatment for myeloma if indicated; Dr. Benay Spice discussed with Dr. Barbee Cough of pathology and she indicated that there are increased number of atypical appearing plasma cells consistent with the diagnosis of multiple myeloma however the immunohistochemical stains are still pending to establish clonality and extent of bone marrow involvement -Dr. Benay Spice does believe the patient has multiple myeloma but feels that he is stable  from a hematological standpoint and the renal function has improved and stabilized and they will go to await final bone marrow results prior to initiating systemic chemotherapy and they have contacted the outpatient [facility] oncologist Dr. Bobby Rumpf; Bone Marrow Bx results can be reviewed in the outpatient setting with Oncology  -Patient is stable from a hematological perspective to be discharged and will need SNF however patient's family cannot afford SNF financially and have elected to take the patient home with them and cannot take him home until hospital bed is delivered and it will be until Monday when this will happen  Complicated UTI/pyelonephritis/pyuria with hematuria in a patient with chronic indwelling Foley catheter due to urinary retention and BPH -Urine culture initially unremarkable.  Repeat urine culture on 05/13/2019 show no growth  -Foley catheter was changed on 05/13/2019 -Blood cultures show no growth to date -patient was placed on IV ceftriaxone - and completed course during hospitalization -Patient continues to have fever and some vomiting and may need to resume antibiotics but will reculture if he spiked another temperature; likely temperatures are related to multiple myeloma as above -Currently has been afebrile for last 48 hours with a T-max of 98.9 -Will need to follow up with Urology in the outpatient setting   Acute metabolic encephalopathy, continues to be somewhat confused but appears to be at baseline due to his underlying cognitive impairment -Suspect some underlying cognitive impairment; Wife at bedside thinks he is at baseline now -Multifactorial including dehydration, AKI, hyperammonemia, low B12, tramadol use -Appears to be improving, although unsure patient's baseline -patient continues to be very emotionally labile but was calm today  -Continue with delirium precautions -PT  OT recommending SNF but unfortunately patient's family cannot afford the SNF  due to financial reasons and because of their insurance and I have elected to take the patient home and this will be done Monday after hospital bed is delivered  Normocytic Anemia -Certainly iron deficiency anemia versus anemia of chronic disease -has received 2u PRBC during hospitalization; now hemoglobin/hematocrit is stabilized and was 7.7/25.1 today -Iron saturation 9, B12 142 -Was given B12 supplementation along with IV iron and ESA per nephrology; patient has received Feraheme x2 will be in the hospital; ESA is currently being held due to possible myeloma -FOBT negative; urine is slightly blood-tinged but this is improved -Of note, patient on Brilinta and Eliquis-this was held for renal biopsy and bone marrow biopsy and patient was placed on a heparin drip we we will resume his home Brilinta and Eliquis today -CT abdomen pelvis made no mention of bleed -Continue to monitor blood count carefully and repeat CBC within 1 week   History of CVA -Left CAS/left MCA stenosis status post left CEA and left MCA stent in January 2021.  On Brilinta.  Patient also noted to have a left atrial appendage thrombus was placed on Eliquis. -Currently no focal neuro deficits other than lower extremity bilateral weakness.  Patient was discharged home from rehab on 04/24/2019. -Continue Brilinta, Eliquis, statin now and will transition off of the heparin drip-PT, OT recommending SNF anticipating discharging the next 24 to 48 hours -May need to hold statin if LFTs continue to worsen  History of CAD -Patient with CABG in 2019 -Currently stable, no complaints of chest pain -continue with Brilinta when able  Essential hypertension -Currently on no meds, will add on meds as needed -BP currently is 136/75  Hypothyroidism -Continue Levothyroxine 100 mcg po Daily  -TSH 6.5- however this was a hemolyzed sample  Metabolic acidosis  -Improved and his CO2 is now 22 with a chloride level of 107 and anion gap  of 9 -Stopped bicarbonate 650 mg p.o. 3 times daily for now  Hyponatremia -Resolved as Sodium is now 138 -Repeat CMP within 1 week   Hyperphosphatemia -Likely due to renal failure, improved and phosphorus is now 3.6 -Treatment per nephrology and now they have signed off the case  Generalized weakness with debility -PT and OT as above recommending SNF but family will take the patient home with home health  Chronic pain -Continued scheduled Tylenol but will stop scheduling it given LFT Elevation and now back on q6hprn -Avoid sedating medications -Check LFTs given that he has been on scheduled Tylenol and LFTs are slightly elevated with an AST of 54 and ALT of 46.   GERD -PPI has been held in the setting of above and will not resume  Gallbladder Thickening -CT A/P: Her wall thickening, without evidence of gallstones. -AST and ALT is slightly elevated but improved from a few days ago; AST was elevated to 72 is now 55 and ALT is now 38 -Patient with no abdominal pain on exam  LA Thrombus/atrial appendage thrombus -Was on Eliquis PTA but held in anticipation of Renal Bx -Last dose of Eliquis was 3/17 before switching to IV heparin -Transitioned to Heparin gtt and currently remains on it we will transition back today as he likely has no further invasive testing needed -Bone marrow biopsy was done as above  DVT prophylaxis: Heparin GTT changed back to Eliquis yesterday  Code Status: FULL CODE  Family Communication: Spoke with the wife Mee Hives over the Telephone Disposition Plan: Patient  was admitted from home for metabolic encephalopathy along with an AKI.  He underwent a renal biopsy and a bone marrow biopsy which is concerning for multiple myeloma.  Continues to have anemia and has been transfused PRBCs.  Patient underwent a bone marrow biopsy the day before yesterday and was likely to go to SNF once medically stable cleared by nephrology as well as oncology but family has now  elected to take him home; oncology feels the patient no longer needs to be in the hospital and likely can be discharged from their perspective and nephrology today sign off the case; currently cannot safely discharge home until hospital bed is delivered and this is likely to be done Monday from my discussion with the case management team.  Consultants:   Medical Oncology  Nephrology  Interventional Radiology    Procedures:  Renal ultrasound Ultra sound guided biopsy the left kidney:  Bone Marrow biopsy   Antimicrobials:  Anti-infectives (From admission, onward)   Start     Dose/Rate Route Frequency Ordered Stop   05/13/19 1045  cefTRIAXone (ROCEPHIN) 1 g in sodium chloride 0.9 % 100 mL IVPB  Status:  Discontinued     1 g 200 mL/hr over 30 Minutes Intravenous Every 24 hours 05/13/19 1036 05/18/19 1024   05/08/19 0130  cefTRIAXone (ROCEPHIN) 1 g in sodium chloride 0.9 % 100 mL IVPB  Status:  Discontinued     1 g 200 mL/hr over 30 Minutes Intravenous Daily at bedtime 05/08/19 0100 05/09/19 2035     Subjective: Patient was seen and examined at bedside and he denies any current complaints at this time.  No chest pain, lightheadedness or dizziness.  No nausea or vomiting.  No other complaints and feels well and understand that he is going to go home tomorrow.  Objective: Vitals:   05/24/19 1747 05/24/19 2144 05/25/19 0552 05/25/19 0917  BP: (!) 153/77 (!) 151/74 (!) 144/71 136/75  Pulse: 70 75 70 72  Resp: 18 (!) 22 (!) 22 18  Temp: 98 F (36.7 C) 98.9 F (37.2 C) 98.4 F (36.9 C) 98.7 F (37.1 C)  TempSrc: Oral Oral Oral Oral  SpO2: 100% 100% 100% 100%  Weight:  104.7 kg    Height:        Intake/Output Summary (Last 24 hours) at 05/25/2019 1126 Last data filed at 05/25/2019 0900 Gross per 24 hour  Intake 1051.66 ml  Output 2165 ml  Net -1113.34 ml   Filed Weights   05/21/19 2054 05/22/19 2010 05/24/19 2144  Weight: 103.2 kg 104 kg 104.7 kg   Examination: Physical  Exam:  Constitutional: WN/WD AAM in NAD and appears calm Eyes: Lids and conjunctivae normal, sclerae anicteric  ENMT: External Ears, Nose appear normal. Grossly normal hearing. Neck: Appears normal, supple, no cervical masses, normal ROM, no appreciable thyromegaly; no JVD Respiratory: Diminished to auscultation bilaterally, no wheezing, rales, rhonchi or crackles. Normal respiratory effort and patient is not tachypenic. No accessory muscle use. Unlabored breathing  Cardiovascular: RRR, no murmurs / rubs / gallops. S1 and S2 auscultated. Mild LE edema. Abdomen: Soft, non-tender, non-distended.  Bowel sounds positive.  GU: Deferred. Has a Chronic Foley Catheter  Musculoskeletal: No clubbing / cyanosis of digits/nails. No joint deformity upper and lower extremities.  Skin: No rashes, lesions, ulcers on a limited skin evaluation. No induration; Warm and dry.  Neurologic: CN 2-12 grossly intact with no focal deficits. Romberg sign and cerebellar reflexes not assessed.  Psychiatric: Slightly impaired judgment and insight. Alert and  awake. Normal mood and appropriate affect.   Data Reviewed: I have personally reviewed following labs and imaging studies  CBC: Recent Labs  Lab 05/21/19 0507 05/22/19 0338 05/23/19 0434 05/24/19 0724 05/25/19 0445  WBC 9.3 7.6 8.3 7.1 7.5  NEUTROABS  --  4.8 5.4 4.5 5.1  HGB 7.9* 7.5* 7.5* 7.5* 7.7*  HCT 25.0* 24.3* 24.2* 24.1* 25.1*  MCV 89.9 89.3 90.6 90.6 91.6  PLT 356 356 386 402* 161*   Basic Metabolic Panel: Recent Labs  Lab 05/21/19 0507 05/22/19 0338 05/23/19 0434 05/24/19 0724 05/25/19 0445  NA 137 137 138   139 138   138 138  K 4.7 4.2 4.5   4.5 4.6   4.4 4.5  CL 108 106 107   108 105   106 107  CO2 22 21* _0 GLUCOSE 98 89 102*   100* 96   93 98  BUN 32* 31* 36*   36* 35*   36* 38*  CREATININE 2.16* 1.94* 1.79*   1.78* 1.71*   1.71* 1.46*  CALCIUM 8.1* 8.0* 8.1*   8.1* 8.3*   8.5* 8.2*  MG  --  2.2 2.1 2.1 2.0  PHOS  4.0 3.4 3.3   3.3 3.6   3.4 2.5   GFR: Estimated Creatinine Clearance: 59.3 mL/min (A) (by C-G formula based on SCr of 1.46 mg/dL (H)). Liver Function Tests: Recent Labs  Lab 05/21/19 0507 05/22/19 0338 05/23/19 0434 05/24/19 0724 05/25/19 0445  AST  --  61* 56* 54* 55*  ALT  --  45* 43 46* 43  ALKPHOS  --  246* 228* 197* 192*  BILITOT  --  0.8 1.0 0.9 1.0  PROT  --  6.4* 6.6 6.5 6.7  ALBUMIN 1.7* 1.7* 1.7*   1.7* 1.7*   1.7* 1.7*   No results for input(s): LIPASE, AMYLASE in the last 168 hours. No results for input(s): AMMONIA in the last 168 hours. Coagulation Profile: Recent Labs  Lab 05/19/19 0542  INR 1.4*   Cardiac Enzymes: No results for input(s): CKTOTAL, CKMB, CKMBINDEX, TROPONINI in the last 168 hours. BNP (last 3 results) No results for input(s): PROBNP in the last 8760 hours. HbA1C: No results for input(s): HGBA1C in the last 72 hours. CBG: No results for input(s): GLUCAP in the last 168 hours. Lipid Profile: No results for input(s): CHOL, HDL, LDLCALC, TRIG, CHOLHDL, LDLDIRECT in the last 72 hours. Thyroid Function Tests: No results for input(s): TSH, T4TOTAL, FREET4, T3FREE, THYROIDAB in the last 72 hours. Anemia Panel: No results for input(s): VITAMINB12, FOLATE, FERRITIN, TIBC, IRON, RETICCTPCT in the last 72 hours. Sepsis Labs: No results for input(s): PROCALCITON, LATICACIDVEN in the last 168 hours.  No results found for this or any previous visit (from the past 240 hour(s)).   RN Pressure Injury Documentation:     Estimated body mass index is 27.37 kg/m as calculated from the following:   Height as of this encounter: 6' 5" (1.956 m).   Weight as of this encounter: 104.7 kg.  Malnutrition Type:  Nutrition Problem: Inadequate oral intake Etiology: acute illness, poor appetite   Malnutrition Characteristics:  Signs/Symptoms: meal completion < 25%   Nutrition Interventions:  Interventions: Magic cup, Refer to RD note for  recommendations, Liberalize Diet, Nepro shake   Radiology Studies: No results found. Scheduled Meds:  apixaban  5 mg Oral BID   atorvastatin  80 mg Oral q1800   Chlorhexidine Gluconate Cloth  6 each Topical Daily   feeding supplement (ENSURE ENLIVE)  237 mL Oral BID BM   feeding supplement (PRO-STAT SUGAR FREE 64)  30 mL Oral QID   influenza vaccine adjuvanted  0.5 mL Intramuscular Tomorrow-1000   levothyroxine  100 mcg Oral Q0600   pneumococcal 23 valent vaccine  0.5 mL Intramuscular Tomorrow-1000   ticagrelor  90 mg Oral BID   vitamin B-12  1,000 mcg Oral Daily   Continuous Infusions:  sodium chloride Stopped (05/24/19 1147)    LOS: 17 days   Kerney Elbe, DO [facility] PAGER is on AMION  If 7PM-7AM, please contact night-coverage [email or web address]

## 2019-05-25 NOTE — Plan of Care (Signed)
  Problem: Health Behavior/Discharge Planning: Goal: Ability to manage health-related needs will improve Outcome: Progressing   Problem: Activity: Goal: Activity intolerance will improve Outcome: Progressing   Problem: Urinary Elimination: Goal: Progression of disease will be identified and treated Outcome: Progressing

## 2019-05-25 NOTE — Plan of Care (Signed)
  Problem: Education: Goal: Knowledge of disease and its progression will improve Outcome: Progressing   

## 2019-05-26 ENCOUNTER — Telehealth: Payer: Self-pay

## 2019-05-26 ENCOUNTER — Encounter: Payer: Self-pay | Admitting: *Deleted

## 2019-05-26 DIAGNOSIS — C9000 Multiple myeloma not having achieved remission: Secondary | ICD-10-CM

## 2019-05-26 LAB — RENAL FUNCTION PANEL
Albumin: 1.8 g/dL — ABNORMAL LOW (ref 3.5–5.0)
Anion gap: 8 (ref 5–15)
BUN: 34 mg/dL — ABNORMAL HIGH (ref 8–23)
CO2: 23 mmol/L (ref 22–32)
Calcium: 8.5 mg/dL — ABNORMAL LOW (ref 8.9–10.3)
Chloride: 110 mmol/L (ref 98–111)
Creatinine, Ser: 1.47 mg/dL — ABNORMAL HIGH (ref 0.61–1.24)
GFR calc Af Amer: 55 mL/min — ABNORMAL LOW (ref >60)
GFR calc non Af Amer: 48 mL/min — ABNORMAL LOW (ref >60)
Glucose, Bld: 100 mg/dL — ABNORMAL HIGH (ref 70–99)
Phosphorus: 3.5 mg/dL (ref 2.5–4.6)
Potassium: 3.7 mmol/L (ref 3.5–5.1)
Sodium: 141 mmol/L (ref 135–145)

## 2019-05-26 LAB — CBC
HCT: 26.1 % — ABNORMAL LOW (ref 39.0–52.0)
Hemoglobin: 8.1 g/dL — ABNORMAL LOW (ref 13.0–17.0)
MCH: 28.2 pg (ref 26.0–34.0)
MCHC: 31 g/dL (ref 30.0–36.0)
MCV: 90.9 fL (ref 80.0–100.0)
Platelets: 507 10*3/uL — ABNORMAL HIGH (ref 150–400)
RBC: 2.87 MIL/uL — ABNORMAL LOW (ref 4.22–5.81)
RDW: 17.9 % — ABNORMAL HIGH (ref 11.5–15.5)
WBC: 7.1 10*3/uL (ref 4.0–10.5)
nRBC: 0 % (ref 0.0–0.2)

## 2019-05-26 LAB — SURGICAL PATHOLOGY

## 2019-05-26 MED ORDER — SALINE SPRAY 0.65 % NA SOLN: 1.0000 | NASAL | 0 refills | Status: AC | PRN

## 2019-05-26 MED ORDER — PRO-STAT SUGAR FREE PO LIQD: 30.0000 mL | Freq: Four times a day (QID) | ORAL | 0 refills | Status: AC

## 2019-05-26 MED ORDER — ENSURE ENLIVE PO LIQD: 237.0000 mL | Freq: Two times a day (BID) | ORAL | 12 refills | Status: AC

## 2019-05-26 MED ORDER — CYANOCOBALAMIN 1000 MCG PO TABS: 1000.0000 ug | ORAL_TABLET | Freq: Every day | ORAL | 0 refills | Status: AC

## 2019-05-26 NOTE — Telephone Encounter (Signed)
Ptn's wife called reg paperwork for [employer] [phone]

## 2019-05-26 NOTE — Plan of Care (Signed)
  Problem: Education: Goal: Knowledge of General Education information will improve Description: Including pain rating scale, medication(s)/side effects and non-pharmacologic comfort measures Outcome: Adequate for Discharge   Problem: Education: Goal: Knowledge of disease and its progression will improve Outcome: Adequate for Discharge   Problem: Health Behavior/Discharge Planning: Goal: Ability to manage health-related needs will improve Outcome: Adequate for Discharge   Problem: Clinical Measurements: Goal: Complications related to the disease process or treatment will be avoided or minimized Outcome: Adequate for Discharge   Problem: Activity: Goal: Activity intolerance will improve Outcome: Adequate for Discharge   Problem: Fluid Volume: Goal: Fluid volume balance will be maintained or improved Outcome: Adequate for Discharge   Problem: Nutritional: Goal: Ability to make appropriate dietary choices will improve Outcome: Adequate for Discharge   Problem: Self-Concept: Goal: Body image disturbance will be avoided or minimized Outcome: Adequate for Discharge   Problem: Urinary Elimination: Goal: Progression of disease will be identified and treated Outcome: Adequate for Discharge

## 2019-05-26 NOTE — Discharge Summary (Signed)
Physician Discharge Summary  Jeremy Sherman Dullea KNL:[id number] DOB: 1950/11/30 DOA: 05/07/2019  PCP: Imagene Riches, NP  Admit date: 05/07/2019 Discharge date: 05/26/2019  Admitted From: Home Disposition: Home with [facility] PT/OT/RN as SNF not a feasible option for the Family   Recommendations for Outpatient Follow-up:  1. Follow up with PCP in 1-2 weeks 2. Follow up with Medical Oncology Dr. Bobby Rumpf in the outpatient setting and follow-up on the bone marrow biopsies prior to initiating systemic therapy for multiple myeloma 3. Follow-up with Nephrology in outpatient setting for continued renal function monitoring and following up on the kidney biopsy 4. Follow-up with urology in the outpatient setting for Foley catheter exchange given that he has a chronic Foley catheter 5. Please obtain CMP/CBC, Mag, Phos in one week 6. Please follow up on the following pending results: Final Bone Marrow Biopsy Results   Home Health: YES Equipment/Devices: Hospital Bed   Discharge Condition: Stable  CODE STATUS: FULL CODE  Diet recommendation: Heart Healthy 2 gram Sodium Diet   Brief/Interim Summary: HPI On 05/08/2019 by Dr. Shela Leff Jeremy Charon [name] medical history significant ofCAD status post CABG, hypertension, hyperlipidemia, hypothyroidism, prediabetes, recent hospital admission for stroke,carotid artery disease status post stent on Eliquis and Brilinta, left atrial appendage thrombus presenting to the ED for evaluation of abnormal labs. Patient was called by his physician after outpatient labs revealed worsening renal failure.Patient appears confused and is not sure why he is here. He has no complaints. Denies fevers, chills, chest pain, cough, shortness of breath, nausea, vomiting, abdominal pain, diarrhea, flank pain, or dysuria. No family available at this time. Per ED provider's conversation with the patient's wife since his  discharge from rehab 2 weeks ago he has had slowly worsening confusion. He has an indwelling Foley catheter since his prior hospitalization which has been draining urine.  Interim history Patient found to have acute kidney injury and nephrology consulted and following. Also developed fever with worsening leukocytosis, work-up thus far has been unremarkable and he continues to spike intermittent temperatures with a T-max of 100.4 yesterday. There was initial concern of CAUTI when he was first admitted however repeat urine cultures show no growth.  Patient underwent a renal biopsy on 05/19/2019 with results still pending.  Because of the elevated M spike he is undergoing a multiple myeloma work-up and received a bone marrow biopsy yesterday morning given that his bone survey is negative.  Preliminary bone marrow biopsy findings showed increased plasma cells that are atypical consistent with a diagnosis of multiple myeloma but the immunohistochemical stains are pending to establish tonality and the oncologist does suspect the patient has multiple myeloma.  They feel that he is stable from a hematologic standpoint and that his renal function is improved and stabilized and await on final bone marrow biopsy result prior to initiating systemic therapy and patient is likely to be referred to the [facility] cancer center.  Patient's renal failure is improving and given the downtrending creatinine prednisone was not used and they were recommending continue sodium bicarbonate for metabolic acidosis but this is now been stopped.  Patient has been transitioned back to his home Brilinta as well as Eliquis and he is stable to be discharged as he has been cleared from a nephrology and oncology perspective but cannot be discharged until 05/26/19 as his Hospital bed will be delivered then.  Patient will follow up with Dr. Bobby Rumpf in [place] for his likely multiple myeloma.    Unfortunately patient  cannot be discharged over  the weekend given that he will require a hospital bed and this was finally delivered today.  Patient's family cannot send the patient to SNF at this time due to financial reasons and will take the patient home.  We will try maximize his home health services prior to discharge and anticipated discharge is Monday, 05/26/2019 pending no issues.   Does have a IgG kappa light chain multiple myeloma and will need to follow bone marrow biopsy results to be done in the outpatient setting.  Patient is to follow-up with PCP, medical oncology as well as nephrology in the outpatient setting as he is improved and doing much better.   Discharge Diagnoses:  Principal Problem:   AKI (acute kidney injury) ([place]) Active Problems:   Essential hypertension   UTI (urinary tract infection) due to urinary indwelling catheter ([facility])   Acute metabolic encephalopathy   QT prolongation  Acute kidney injury, improving  -Creatinine on admission 4.72 (baseline approximately 1) -BUN/creatinine is trended down and is now improved to 34/1.47 -Nephrology consulted and appreciated-renal obtained out biopsy -Interventional radiology consulted and appreciated, s/p US guided biopsy of left kidney- biopsy results pending -Renal ultrasound unremarkable -ANCA negative -Patient was placed on IV fluids which is now stopped -Renal biopsy preliminary showed severe stenosing arteriosclerosis and chronic active interstitial nephritis -Nephrologhy thinks that this could be PPI induced Intersitial Nephritis -Dr. Johnney Ou has placed a call to [facility] nephro pathology for additional information about possibly starting the patient on steroids per Dr. Posey Pronto feels that it is not indicated at this time given her downtrending creatinine -Continued to monitor CMP and nephrology feels the patient is stable from their perspective and can be discharged and follow-up in outpatient setting -Follow-up CMP within 1 week  M spike in the setting of IgG Kappa  Light Chain Multiple Myeloma -Patient noted to have M spike and pending renal biopsyresults -Oncology, Dr. Benay Spice, consulted and appreciated. Recommended serum immunofixation and bone survey; -Serum immunofixation is finally back and showed a total protein ELP of 5.9, and IgG of 1772, and IgM of 168, and an IgA level of 234 -Nephrology is also ordering SPEP and free light chains as well as patient having abnormal kappa lambda free light chain ratio -Bone survey was done and showed "Unremarkable bone survey. No lytic or sclerotic bone lesions are identified." -Patient underwent a bone marrow biopsy yesterday and Dr. Benay Spice reviewed this and preliminary report showed 30% plasma cells which is a not a definitive diagnosis of multiple myeloma but will review with the pathologist in detail tomorrow and to initiate systemic treatment for myeloma if indicated; Dr. Benay Spice discussed with Dr. Barbee Cough of pathology and she indicated that there are increased number of atypical appearing plasma cells consistent with the diagnosis of multiple myeloma however the immunohistochemical stains are still pending to establish clonality and extent of bone marrow involvement -Dr. Benay Spice does believe the patient has multiple myeloma and likely is confirmed due to the serum immunofixation being back; he is stable from a hematological standpoint and the renal function has improved and stabilized and they will go to await final bone marrow results prior to initiating systemic chemotherapy and they have contacted the outpatient [facility] oncologist Dr. Bobby Rumpf; Bone Marrow Bx results can be reviewed in the outpatient setting with Oncology  as the final bone marrow she biopsy results are still pending -Patient is stable from a hematological perspective to be discharged and will need SNF however patient's family cannot  afford SNF financially and have elected to take the patient home with them and cannot take him home  until hospital bed is delivered and this was done this morning  Complicated UTI/pyelonephritis/pyuria with hematuria in a patient with chronic indwelling Foley catheter due to urinary retention and BPH -Urine culture initially unremarkable. Repeat urine culture on 05/13/2019 show no growth  -Foley catheter was changed on 05/13/2019 -Blood cultures show no growth to date -patient was placed on IV ceftriaxone - and completed course during hospitalization -Patient continues to have fever and some vomiting and may need to resume antibiotics but will reculture if he spiked another temperature; likely temperatures are related to multiple myeloma as above -Currently has been afebrile for last 48 hours with a T-max of 98.9 -Will need to follow up with Urology in the outpatient setting  for Foley catheter maintenance and exchanges  Acute metabolic encephalopathy, continues to be somewhat confused but appears to be at baseline due to his underlying cognitive impairment -Suspect some underlying cognitive impairment; Wife at bedside thinks he is at baseline now -Multifactorial including dehydration, AKI, hyperammonemia, low B12, tramadol use -Appears to be improving, although unsure patient's baseline -patient continues to be very emotionally labile but was calm today  -Continue with delirium precautions -PT OT recommending SNF but unfortunately patient's family cannot afford the SNF due to financial reasons and because of their insurance and have elected to take the patient home and this will be done Monday after hospital bed is delivered  Normocytic Anemia -Certainly iron deficiency anemia versus anemia of chronic disease -has received 2u PRBC during hospitalization; now hemoglobin/hematocrit is stabilized and was  8.1/26.1 -Iron saturation 9, B12 142 -Was given B12 supplementation along with IV iron and ESA per nephrology; patient has received Feraheme x2 will be in the hospital; ESA is currently  being held due to possible myeloma -FOBT negative; urine is slightly blood-tinged but this is improved -Of note, patient on Brilinta and Eliquis-this was held for renal biopsy and bone marrow biopsy and patient was placed on a heparin drip we we will resume his home Brilinta and Eliquis today -CT abdomen pelvis made no mention of bleed -Continue to monitor blood count carefully and repeat CBC within 1 week   History of CVA -Left CAS/left MCA stenosis status post left CEA and left MCA stent in January 2021. On Brilinta. Patient also noted to have a left atrial appendage thrombus was placed on Eliquis. -Currently no focal neuro deficits other than lower extremity bilateral weakness. Patient was discharged home from rehab on 04/24/2019. -Continue Brilinta, Eliquis, statin now and will transition off of the heparin drip -PT, OT recommending SNF but family is taking the patient home with home health services -May need to hold statin if LFTs continue to worsen  History of CAD -Patient with CABG in 2019 -Currently stable, no complaints of chest pain -continue with Brilinta   Essential hypertension -Currently on no meds, will add on meds as needed -BP currently is 121/83  Hypothyroidism -Continue Levothyroxine 100 mcg po Daily  -TSH 6.5- however this was a hemolyzed sample -Follow-up TFTs in the outpatient setting  Metabolic acidosis  -Improved and his CO2 is now 23 with a chloride level of 110 and anion gap of 8 -Stopped bicarbonate 650 mg p.o. 3 times daily for now  Hyponatremia -Resolved as Sodium is now 141 -Repeat CMP within 1 week   Hyperphosphatemia -Likely due to renal failure, improved and phosphorus is now 3.6 -Treatment per nephrology and  now they have signed off the case  Generalized weakness with debility -PT and OT as above recommending SNF but family will take the patient home with home health  Chronic Pain -Continued scheduled Tylenol but will stop  scheduling it given LFT Elevation and now back on q6hprn -Avoid sedating medications and will discontinue his tramadol at discharge -Check LFTs given that he has been on scheduled Tylenol and LFTs are slightly elevated with an AST of 55 and ALT of 43.  GERD -PPI has been held in the setting of above and will not resume  Gallbladder Thickening -CT A/P: Her wall thickening, without evidence of gallstones. -AST and ALT is slightly elevated but improved from a few days ago; AST was elevated to 72 is now 55 and ALT is now 55 -Patient with no abdominal pain on exam  LA Thrombus/atrial appendage thrombus -Was on Eliquis PTA but held in anticipation of Renal Bx -Last dose of Eliquis was 3/17 before switching to IV heparin -Transitioned to Heparin gtt and addition back to Eliquis as he likely does not need any further invasive testing -Bone marrow biopsy was done and final biopsy results are still pending  Discharge Instructions  Discharge Instructions    Call MD for:  difficulty breathing, headache or visual disturbances   Complete by: As directed    Call MD for:  extreme fatigue   Complete by: As directed    Call MD for:  hives   Complete by: As directed    Call MD for:  persistant dizziness or light-headedness   Complete by: As directed    Call MD for:  persistant nausea and vomiting   Complete by: As directed    Call MD for:  redness, tenderness, or signs of infection (pain, swelling, redness, odor or green/yellow discharge around incision site)   Complete by: As directed    Call MD for:  severe uncontrolled pain   Complete by: As directed    Call MD for:  temperature >100.4   Complete by: As directed    Diet - low sodium heart healthy   Complete by: As directed    Discharge instructions   Complete by: As directed    You were cared for by a hospitalist during your hospital stay. If you have any questions about your discharge medications or the care you received while you were  in the hospital after you are discharged, you can call the unit and ask to speak with the hospitalist on call if the hospitalist that took care of you is not available. Once you are discharged, your primary care physician will handle any further medical issues. Please note that NO REFILLS for any discharge medications will be authorized once you are discharged, as it is imperative that you return to your primary care physician (or establish a relationship with a primary care physician if you do not have one) for your aftercare needs so that they can reassess your need for medications and monitor your lab values.  Follow up with PCP, Medical Oncology, and Nephrology as an outpatient. Follow up on the Biopsy Results. Take all medications as prescribed. If symptoms change or worsen please return to the ED for evaluation   Increase activity slowly   Complete by: As directed      Allergies as of 05/26/2019   No Known Allergies     Medication List    STOP taking these medications   ammonium lactate 12 % lotion Commonly known as: LAC-HYDRIN  pantoprazole 40 MG tablet Commonly known as: PROTONIX   traMADol 50 MG tablet Commonly known as: ULTRAM     TAKE these medications   apixaban 5 MG Tabs tablet Commonly known as: ELIQUIS Take 1 tablet (5 mg total) by mouth 2 (two) times daily.   atorvastatin 80 MG tablet Commonly known as: LIPITOR Take 1 tablet (80 mg total) by mouth daily at 6 PM.   cyanocobalamin 1000 MCG tablet Take 1 tablet (1,000 mcg total) by mouth daily. Start taking on: May 27, 2019   diclofenac Sodium 1 % Gel Commonly known as: VOLTAREN Apply 2 g topically 4 (four) times daily.   feeding supplement (ENSURE ENLIVE) Liqd Take 237 mLs by mouth 2 (two) times daily between meals.   feeding supplement (PRO-STAT SUGAR FREE 64) Liqd Take 30 mLs by mouth 4 (four) times daily.   levothyroxine 100 MCG tablet Commonly known as: SYNTHROID Take 1 tablet (100 mcg total) by  mouth daily at 6 (six) AM.   polyethylene glycol 17 g packet Commonly known as: MIRALAX / GLYCOLAX Take 17 g by mouth daily. What changed:   when to take this  reasons to take this   senna 8.6 MG Tabs tablet Commonly known as: SENOKOT Take 1 tablet (8.6 mg total) by mouth at bedtime. What changed:   when to take this  reasons to take this   sodium chloride 0.65 % Soln nasal spray Commonly known as: OCEAN Place 1 spray into both nostrils as needed for congestion.   ticagrelor 90 MG Tabs tablet Commonly known as: BRILINTA Take 1 tablet (90 mg total) by mouth 2 (two) times daily.            Durable Medical Equipment  (From admission, onward)         Start     Ordered   05/13/19 1023  For home use only DME Hospital bed  Once    Question Answer Comment  Length of Need Lifetime   Patient has (list medical condition): Acute Kidney Injury, weakness, chronic indwelling urinary catheter, confusion   The above medical condition requires: Patient requires the ability to reposition frequently   Head must be elevated greater than: 30 degrees   Bed type Semi-electric   Support Surface: Gel Overlay      05/13/19 1025         Follow-up Information    Madelon Lips, MD. Go on 06/20/2019.   Specialty: Nephrology Why: Appointment at 11:30 AM.  You will have labs done 1 week prior to the appointment. Contact information: [place] [address] [address] [place] Follow up.   Specialty: Home Health Services Contact information: [address] [address] [phone]          No Known Allergies  Consultations:  Medical Oncology  Nephrology  Interventional Radiology   Procedures/Studies: CT ABDOMEN PELVIS WO CONTRAST  Result Date: 05/18/2019 CLINICAL DATA:  Anemia with mild back pain. EXAM: CT ABDOMEN AND PELVIS WITHOUT CONTRAST TECHNIQUE: Multidetector CT imaging of the abdomen and pelvis was performed  following the standard protocol without IV contrast. COMPARISON:  None. FINDINGS: Lower chest: No acute abnormality. Hepatobiliary: No focal liver abnormality is seen. The gallbladder is moderately distended, without evidence of gallstones. Mild gallbladder wall thickening is seen. Very mild pericholecystic inflammation is suspected. Pancreas: Unremarkable. No pancreatic ductal dilatation or surrounding inflammatory changes. Spleen: Normal in size without focal abnormality. Adrenals/Urinary Tract: Adrenal  glands are unremarkable. Kidneys are normal in size, without renal calculi or hydronephrosis. An ill-defined 1.1 cm cyst is seen within the anterior aspect of the mid right kidney. A 1.8 cm cyst is seen within the anteromedial aspect of the mid left kidney. Mild, bilateral nonspecific perinephric inflammatory fat stranding is noted. A Foley catheter is seen within a contracted urinary bladder. Mild diffuse urinary bladder wall thickening is seen. Stomach/Bowel: Stomach is within normal limits. The appendix is not clearly identified. No evidence of bowel dilatation. Noninflamed diverticula are seen within the proximal sigmoid colon. Vascular/Lymphatic: There is mild aortic calcification. No enlarged abdominal or pelvic lymph nodes. Reproductive: Prostate is unremarkable. Other: No abdominal wall hernia or abnormality. No abdominopelvic ascites. Musculoskeletal: Degenerative changes seen throughout the lumbar spine. IMPRESSION: 1. Mild gallbladder wall thickening, without evidence of gallstones. Very mild pericholecystic inflammation is suspected. If there is concern for acute cholecystitis, a right upper quadrant ultrasound could be performed for further evaluation. 2. Bilateral renal cysts with mild, bilateral nonspecific perinephric inflammatory fat stranding. Sequelae associated with acute pyelonephritis cannot be excluded. 3. Noninflamed sigmoid diverticulosis. Aortic Atherosclerosis (ICD10-I70.0).  Electronically Signed   By: Virgina Norfolk M.D.   On: 05/18/2019 16:03   DG Abd 1 View  Result Date: 05/13/2019 CLINICAL DATA:  Abdominal pain. EXAM: ABDOMEN - 1 VIEW COMPARISON:  March 23, 2019. FINDINGS: The bowel gas pattern is normal. No radio-opaque calculi or other significant radiographic abnormality are seen. IMPRESSION: Negative. Electronically Signed   By: Marijo Conception M.D.   On: 05/13/2019 16:26   CT HEAD WO CONTRAST  Result Date: 05/08/2019 CLINICAL DATA:  69 year old male with encephalopathy. EXAM: CT HEAD WITHOUT CONTRAST TECHNIQUE: Contiguous axial images were obtained from the base of the skull through the vertex without intravenous contrast. COMPARISON:  Brain MRI 03/18/2019. CTA head and neck 03/26/2019. FINDINGS: Brain: Mild streak artifact associated with a distal ACA region aneurysm coil pack. No midline shift, ventriculomegaly, mass effect, evidence of mass lesion, intracranial hemorrhage or evidence of cortically based acute infarction. Gray-white matter differentiation is within normal limits throughout the brain. Mild for age white matter hypodensity, most pronounced in the right cerebellum. Vascular: No suspicious intracranial vascular hyperdensity. Skull: No acute osseous abnormality identified. Sinuses/Orbits: The visible paranasal sinuses have cleared since January. Tympanic cavities and mastoids remain well pneumatized. Other: No acute orbit or scalp soft tissue finding. IMPRESSION: 1. No acute intracranial abnormality. 2. Left MCA M1 and distal ACA stents with pericallosal region coil pack redemonstrated. Electronically Signed   By: Genevie Ann M.D.   On: 05/08/2019 02:30   US RENAL  Result Date: 05/08/2019 CLINICAL DATA:  Acute kidney injury EXAM: RENAL / URINARY TRACT ULTRASOUND COMPLETE COMPARISON:  March 24, 2019 FINDINGS: Right Kidney: Renal measurements: 12.5 x 5.7 x 4.2 cm = volume: 156 mL . Echogenicity within normal limits. No mass or hydronephrosis  visualized. Left Kidney: Renal measurements: 12.3 x 6.1 x 4.6 cm = volume: 180 mL. Echogenicity within normal limits. No mass or hydronephrosis visualized. Bladder: The bladder is decompressed with a Foley catheter. Other: None. IMPRESSION: No acute abnormality.  No hydronephrosis. Electronically Signed   By: Constance Holster M.D.   On: 05/08/2019 02:49   DG Chest Port 1 View  Result Date: 05/08/2019 CLINICAL DATA:  69 year old male with pain. EXAM: PORTABLE CHEST 1 VIEW COMPARISON:  Chest radiographs 04/02/2019 and earlier. FINDINGS: Portable AP semi upright view at 2349 hours. Right PICC line has been removed since last month. Stable cardiomegaly  and mediastinal contours. Lung volumes are at the upper limits of normal. Allowing for portable technique the lungs are clear. Visualized tracheal air column is within normal limits. No pneumothorax. Prior sternotomy. IMPRESSION: Stable cardiomegaly. No acute cardiopulmonary abnormality. Electronically Signed   By: Genevie Ann M.D.   On: 05/08/2019 00:15   DG Bone Survey Met  Result Date: 05/20/2019 CLINICAL DATA:  Acute renal failure. 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 No lytic or sclerotic bone lesions. No lytic myelomatous lesions are identified involving the axial or appendicular skeleton. Degenerative changes involving the spine but no bone lesions. Moderate scattered vascular calcifications. Left carotid artery stent noted. Median sternotomy wires are noted. IMPRESSION: Unremarkable bone survey. No lytic or sclerotic bone lesions are identified. Electronically Signed   By: Marijo Sanes M.D.   On: 05/20/2019 11:18   CT BONE MARROW BIOPSY & ASPIRATION  Result Date: 05/22/2019 CLINICAL DATA:  Elevated M spike and light chains EXAM: CT GUIDED DEEP ILIAC BONE ASPIRATION AND CORE BIOPSY TECHNIQUE: Patient was placed prone on the CT gantry and  limited axial scans through the pelvis were obtained. Appropriate skin entry site was identified. Skin site was marked, prepped with chlorhexidine, draped in usual sterile fashion, and infiltrated locally with 1% lidocaine. Intravenous Fentanyl 12mg and Versed 229mwere administered as conscious sedation during continuous monitoring of the patient's level of consciousness and physiological / cardiorespiratory status by the radiology RN, with a total moderate sedation time of 10 minutes. Under CT fluoroscopic guidance an 11-gauge Cook trocar bone needle was advanced into the right iliac bone just lateral to the sacroiliac joint. Once needle tip position was confirmed, core and aspiration samples were obtained, submitted to pathology for approval. Post procedure scans show no hematoma or fracture. Patient tolerated procedure well. COMPLICATIONS: COMPLICATIONS none IMPRESSION: 1. Technically successful CT guided right iliac bone core and aspiration biopsy. Electronically Signed   By: D Lucrezia Europe.D.   On: 05/22/2019 15:21   [place] (KIDNEY)  Result Date: 05/19/2019 INDICATION: Worsening renal function and need for renal biopsy. EXAM: ULTRASOUND GUIDED CORE BIOPSY OF LEFT KIDNEY MEDICATIONS: None. ANESTHESIA/SEDATION: Fentanyl 50 mcg IV; Versed 1.0 mg IV Moderate Sedation Time:  15 minutes. The patient was continuously monitored during the procedure by the interventional radiology nurse under my direct supervision. PROCEDURE: The procedure, risks, benefits, and alternatives were explained to the patient. Questions regarding the procedure were encouraged and answered. The patient understands and consents to the procedure. A time-out was performed prior to initiating the procedure. Both kidneys were examined by ultrasound in a prone position. The left flank region was prepped with chlorhexidine in a sterile fashion, and a sterile drape was applied covering the operative field. A sterile gown and sterile gloves were  used for the procedure. Local anesthesia was provided with 1% Lidocaine. Under ultrasound guidance, 2 separate 16 gauge core biopsy samples were obtained at the level of lower pole renal cortex. Core biopsy samples were submitted in saline. Additional ultrasound was performed after removing the biopsy needle. COMPLICATIONS: None immediate. FINDINGS: The left kidney was chosen for biopsy. Both were well visualized by ultrasound. Solid material was obtained at the level of the left renal cortex. IMPRESSION: Ultrasound-guided core biopsy performed of the left kidney at the level of lower pole renal cortex. Electronically Signed   By: GlAletta Edouard.D.   On: 05/19/2019 16:02     Subjective:  Seen and examined at bedside and patient denies any complaints at this time.  Denies any lightheadedness, chest pain, dizziness.  No nausea or vomiting.  He feels well and is stable to be discharged home as his renal function is improved.  Hematology and nephrology have cleared the patient for discharge and wife is taking him home given that the SNF is not currently feasible for the patient's family.  Discharge Exam: Vitals:   05/26/19 0539 05/26/19 0927  BP: (!) 146/81 121/83  Pulse: 73 72  Resp: 19 18  Temp: 98.1 F (36.7 C) 98 F (36.7 C)  SpO2: 99% 96%   Vitals:   05/25/19 2119 05/25/19 2223 05/26/19 0539 05/26/19 0927  BP: (!) 161/70 (!) 144/80 (!) 146/81 121/83  Pulse: 72 72 73 72  Resp: 20  19 18   Temp: 98.9 F (37.2 C)  98.1 F (36.7 C) 98 F (36.7 C)  TempSrc: Oral  Oral Oral  SpO2: 100%  99% 96%  Weight: 104.7 kg     Height:       General: Pt is alert, awake, not in acute distress Cardiovascular: RRR, S1/S2 +, no rubs, no gallops Respiratory: Diminished bilaterally, no wheezing, no rhonchi Abdominal: Soft, NT, slightly distended secondary body habitus, bowel sounds + Extremities: no edema, no cyanosis  The results of significant diagnostics from this hospitalization (including  imaging, microbiology, ancillary and laboratory) are listed below for reference.    Microbiology: No results found for this or any previous visit (from the past 240 hour(s)).   Labs: BNP (last 3 results) No results for input(s): BNP in the last 8760 hours. Basic Metabolic Panel: Recent Labs  Lab 05/22/19 0338 05/23/19 0434 05/24/19 0724 05/25/19 0445 05/26/19 0606  NA 137 138  139 138  138 138 141  K 4.2 4.5  4.5 4.6  4.4 4.5 3.7  CL 106 107  108 105  106 107 110  CO2 21* 23  22 24  24 22 23   GLUCOSE 89 102*  100* 96  93 98 100*  BUN 31* 36*  36* 35*  36* 38* 34*  CREATININE 1.94* 1.79*  1.78* 1.71*  1.71* 1.46* 1.47*  CALCIUM 8.0* 8.1*  8.1* 8.3*  8.5* 8.2* 8.5*  MG 2.2 2.1 2.1 2.0  --   PHOS 3.4 3.3  3.3 3.6  3.4 2.5 3.5   Liver Function Tests: Recent Labs  Lab 05/22/19 0338 05/23/19 0434 05/24/19 0724 05/25/19 0445 05/26/19 0606  AST 61* 56* 54* 55*  --   ALT 45* 43 46* 43  --   ALKPHOS 246* 228* 197* 192*  --   BILITOT 0.8 1.0 0.9 1.0  --   PROT 6.4* 6.6 6.5 6.7  --   ALBUMIN 1.7* 1.7*  1.7* 1.7*  1.7* 1.7* 1.8*   No results for input(s): LIPASE, AMYLASE in the last 168 hours. No results for input(s): AMMONIA in the last 168 hours. CBC: Recent Labs  Lab 05/22/19 0338 05/23/19 0434 05/24/19 0724 05/25/19 0445 05/26/19 0606  WBC 7.6 8.3 7.1 7.5 7.1  NEUTROABS 4.8 5.4 4.5 5.1  --   HGB 7.5* 7.5* 7.5* 7.7* 8.1*  HCT 24.3* 24.2* 24.1* 25.1* 26.1*  MCV 89.3 90.6 90.6 91.6 90.9  PLT 356 386 402* 443* 507*   Cardiac Enzymes: No results for input(s): CKTOTAL, CKMB, CKMBINDEX, TROPONINI in the last 168 hours. BNP: Invalid input(s): POCBNP CBG: No results for input(s): GLUCAP in the last 168 hours. D-Dimer No results for input(s): DDIMER in  the last 72 hours. Hgb A1c No results for input(s): HGBA1C in the last 72 hours. Lipid Profile No results for input(s): CHOL, HDL, LDLCALC, TRIG, CHOLHDL, LDLDIRECT in the last 72 hours. Thyroid  function studies No results for input(s): TSH, T4TOTAL, T3FREE, THYROIDAB in the last 72 hours.  Invalid input(s): FREET3 Anemia work up No results for input(s): VITAMINB12, FOLATE, FERRITIN, TIBC, IRON, RETICCTPCT in the last 72 hours. Urinalysis    Component Value Date/Time   COLORURINE YELLOW 05/13/2019 0954   APPEARANCEUR HAZY (A) 05/13/2019 0954   LABSPEC 1.016 05/13/2019 0954   PHURINE 6.0 05/13/2019 0954   GLUCOSEU NEGATIVE 05/13/2019 0954   HGBUR LARGE (A) 05/13/2019 0954   BILIRUBINUR NEGATIVE 05/13/2019 [place] 05/13/2019 0954   PROTEINUR 100 (A) 05/13/2019 0954   NITRITE NEGATIVE 05/13/2019 0954   LEUKOCYTESUR LARGE (A) 05/13/2019 0954   Sepsis Labs Invalid input(s): PROCALCITONIN,  WBC,  LACTICIDVEN Microbiology No results found for this or any previous visit (from the past 240 hour(s)).  Time coordinating discharge: 35 minutes  SIGNED:  Kerney Elbe, DO [facility] 05/26/2019, 11:54 AM Pager is on [place]  If 7PM-7AM, please contact night-coverage [email or web address] Password [email or web address]

## 2019-05-26 NOTE — Discharge Instructions (Signed)
Information on my medicine - ELIQUIS (apixaban)  This medication education was reviewed with me or my healthcare representative as part of my discharge preparation.   You were on this medication prior to this hospital admission.  Eliquis was continued.   Why was Eliquis prescribed for you?    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 Eliquis continued for this history of LAA thrombus.  What do You need to know about Eliquis ? Take your Eliquis TWICE DAILY - one tablet in the morning and one tablet in the evening with or without food. If you have difficulty swallowing the tablet whole please discuss with your pharmacist how to take the medication safely.  Take Eliquis exactly as prescribed by your doctor and DO NOT stop taking Eliquis without talking to the doctor who prescribed the medication.  Stopping may increase your risk of developing a stroke.  Refill your prescription before you run out.  After discharge, you should have regular check-up appointments with your healthcare provider that is prescribing your Eliquis.  In the future your dose may need to be changed if your kidney function or weight changes by a significant amount or as you get older.  What do you do if you miss a dose? If you miss a dose, take it as soon as you remember on the same day and resume taking twice daily.  Do not take more than one dose of ELIQUIS at the same time to make up a missed dose.  Important Safety Information A possible side effect of Eliquis is bleeding. You should call your healthcare provider right away if you experience any of the following: ? Bleeding from an injury or your nose that does not stop. ? Unusual colored urine (red or dark brown) or unusual colored stools (red or black). ? Unusual bruising for unknown reasons. ? A serious fall or if you hit your head (even if there is no bleeding).  Some medicines may interact with Eliquis and might increase your risk of  bleeding or clotting while on Eliquis. To help avoid this, consult your healthcare provider or pharmacist prior to using any new prescription or non-prescription medications, including herbals, vitamins, non-steroidal anti-inflammatory drugs (NSAIDs) and supplements.  This website has more information on Eliquis (apixaban): http://www.eliquis.com/eliquis/home

## 2019-05-26 NOTE — Plan of Care (Signed)
  Problem: Education: Goal: Knowledge of General Education information will improve Description Including pain rating scale, medication(s)/side effects and non-pharmacologic comfort measures Outcome: Progressing   

## 2019-05-26 NOTE — Progress Notes (Signed)
IP PROGRESS NOTE  Subjective:  No complaint.  Objective: Vital signs in last 24 hours: Blood pressure 121/83, pulse 72, temperature 98 F (36.7 C), temperature source Oral, resp. rate 18, height 6' 5"  (1.956 m), weight 230 lb 13.2 oz (104.7 kg), SpO2 96 %.  Intake/Output from previous day: 03/28 0701 - 03/29 0700 In: 820 [P.O.:660] Out: 2025 [Urine:2025]  Physical Exam:  General: Awake and alert, no distress   Lab Results: Recent Labs    05/25/19 0445 05/26/19 0606  WBC 7.5 7.1  HGB 7.7* 8.1*  HCT 25.1* 26.1*  PLT 443* 507*   Blood smear from 05/20/2019: The platelets appear normal in number, few large platelets.  Few plasmacytoid lymphocytes, but no monotonous white cell population.  No blasts.  Numerous acanthocytes and burr cells.  Rare schistocytes.  Few ovalocytes.  Recent Labs    05/25/19 0445 05/26/19 0606  NA 138 141  K 4.5 3.7  CL 107 110  CO2 22 23  GLUCOSE 98 100*  BUN 38* 34*  CREATININE 1.46* 1.47*  CALCIUM 8.2* 8.5*    No results found for: CEA1  Studies/Results: No results found.  Medications: I have reviewed the patient's current medications.  Assessment/Plan:  1.  Multiple myeloma - Elevated M spike and kappa light chains, serum intrafixation positive for an IgG kappa monoclonal protein -05/12/2019-M spike was 1.0, kappa free light chain to 20.7, lambda free light chain 40.9, kappa, lambda light chain ratio 5.40 -Elevated urine free kappa light chains -Negative metastatic bone survey 05/20/2019 -Bone marrow biopsy 05/22/2019, increased plasma cells (13% on aspirate, 10-15% on biopsy), kappa light chain restricted  2.  AKI -Renal biopsy performed on 05/19/2019 and results are pending 3.  Anemia 4.  Complicated UTI/pyelonephritis with chronic indwelling Foley catheter due to urinary retention and BPH 5.  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  CAD 7.  Hypertension 8.  Hypothyroidism 9.  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  Diabetes  Mr. Runion appears unchanged.  The hemoglobin and creatinine are stable.  The bone marrow biopsy is consistent with a diagnosis of multiple myeloma.  Mr. Giller will be evaluated and treated by Dr. Bobby Rumpf at the [facility] cancer center.  I discussed the diagnosis of multiple myeloma with Mr. Deshler this morning.  His wife was not present.  I was unable to locate his wife by home phone or the hospital room phone today.  Recommendations: 1.  Okay to discharge to home from an oncology standpoint 2.  Outpatient follow-up will be scheduled with Dr. Bobby Rumpf at the [facility] cancer center..  The patient may discharge to home from our perspective if otherwise medically stable.  There is no indication for urgent chemotherapy given his continued improvement in his renal function.  Please call medical oncology over the weekend for questions.  We will follow up on Monday if he is still in the hospital.    LOS: 18 days   Betsy Coder, MD

## 2019-05-26 NOTE — TOC Transition Note (Signed)
Transition of Care [facility]) - CM/SW Discharge Note   Patient Details  Name: Langston Metter MRN: [id number] Date of Birth: [birth year]  Transition of Care [facility]) CM/SW Contact:  Bartholomew Crews, RN Phone Number: [phone] 05/26/2019, 12:11 PM   Clinical Narrative:    Received call from patient's spouse stating that hospital bed had been delivered. She states that she will arrive about 12:30 to pick patient up. MD notified. Patient is ready for discharge. No further TOC needs identified.    Final next level of care: [place] Barriers to Discharge: No Barriers Identified   Patient Goals and CMS Choice        Discharge Placement                       Discharge Plan and Services                DME Arranged: Hospital bed DME Agency: [employer] Date DME Agency Contacted: 05/24/19   Representative spoke with at DME Agency: [place]: RN, PT, OT [facility] Agency: [facility] Date [place]: 05/26/19 Time [place]: 1210 Representative spoke with at [place]: orders and clinical notes faxed  Social Determinants of Health ([place]) Interventions     Readmission Risk Interventions No flowsheet data found.

## 2019-05-26 NOTE — Progress Notes (Signed)
Faxed referral w/Dr. Gearldine Shown consult note and visit note today, discharge summary,labs to Dr. Keturah Barre. Jeremy Sherman [phone] at [facility] requesting appointment as soon as possible. Demographics included.

## 2019-05-28 ENCOUNTER — Encounter (HOSPITAL_COMMUNITY): Payer: Self-pay | Admitting: Internal Medicine

## 2019-05-28 LAB — SURGICAL PATHOLOGY

## 2019-05-29 ENCOUNTER — Encounter (HOSPITAL_COMMUNITY): Payer: Self-pay | Admitting: Oncology

## 2019-06-02 ENCOUNTER — Ambulatory Visit: Payer: BC Managed Care – PPO | Admitting: Physical Medicine and Rehabilitation

## 2019-06-03 ENCOUNTER — Telehealth: Payer: Self-pay | Admitting: *Deleted

## 2019-06-03 ENCOUNTER — Encounter: Payer: BC Managed Care – PPO | Admitting: Physical Medicine and Rehabilitation

## 2019-06-03 ENCOUNTER — Encounter (HOSPITAL_COMMUNITY): Payer: Self-pay | Admitting: Oncology

## 2019-06-03 NOTE — Telephone Encounter (Signed)
Received fax stating Mr Ransburg has an appt with Dr Bobby Rumpf on 06/05/19 @ 3:00pm

## 2019-06-04 ENCOUNTER — Ambulatory Visit (INDEPENDENT_AMBULATORY_CARE_PROVIDER_SITE_OTHER): Payer: BC Managed Care – PPO | Admitting: Diagnostic Neuroimaging

## 2019-06-04 ENCOUNTER — Encounter: Payer: Self-pay | Admitting: Diagnostic Neuroimaging

## 2019-06-04 ENCOUNTER — Other Ambulatory Visit: Payer: Self-pay

## 2019-06-04 VITALS — BP 149/90 | HR 73 | Temp 97.4°F

## 2019-06-04 DIAGNOSIS — I63512 Cerebral infarction due to unspecified occlusion or stenosis of left middle cerebral artery: Secondary | ICD-10-CM | POA: Diagnosis not present

## 2019-06-04 DIAGNOSIS — I6522 Occlusion and stenosis of left carotid artery: Secondary | ICD-10-CM

## 2019-06-04 NOTE — Progress Notes (Signed)
[facility]  PATIENT: Rolly Pancake DOB: 29-Mar-1950  REFERRING CLINICIAN: Imagene Riches, NP HISTORY FROM: patient and wife REASON FOR VISIT: new consult    HISTORICAL  CHIEF COMPLAINT:  Chief Complaint  Patient presents with  . Cerebrovascular Accident    rm [place] "had stroke in Jan, went to rehab, recently in hospital again for AKI"    HISTORY OF PRESENT ILLNESS:   69 year old male here for evaluation of stroke follow-up.  Patient was admitted to hospital in January 2021 with dizziness, confusion, unresponsiveness.  Patient had MRI which demonstrated stroke and he was transferred to [facility] hospital.  Patient had left carotid endarterectomy, left M1 stent angioplasty and left ACA aneurysm coiling procedures done.  Patient was discharged on Brilinta and Eliquis.  Patient went to rehabilitation and then went home.  More recently he has had readmission to the hospital for acute kidney injury.  Now back home.  Right sided weakness is improving.  He has home physical therapy and home aides.  He is tolerating his medications.   03/29/19 Dr. Erlinda Hong: "69 y.o. 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 He subsequently became diaphoretic, hypotensive andunresponsiveand was taken to [facility] where an MRI revealed astroke.transferred to [facility]. Found to have a 90% L M1 stenosis, large L ACA aneurysm and atrial appendage clot. Underwent L CEA followed by L M1 stent angioplasty and stent assisted coiling L ACA aneurysm. Found to have R hemiparesis following procedure."    REVIEW OF SYSTEMS: Full 14 system review of systems performed and negative with exception of: As per HPI.  ALLERGIES: No Known Allergies  HOME  MEDICATIONS: Outpatient Medications Prior to Visit  Medication Sig Dispense Refill  . Amino Acids-Protein Hydrolys (FEEDING SUPPLEMENT, PRO-STAT SUGAR FREE 64,) LIQD Take 30 mLs by mouth 4 (four) times daily. 887 mL 0  . apixaban (ELIQUIS) 5 MG TABS tablet Take 1 tablet (5 mg total) by mouth 2 (two) times daily. 60 tablet 0  . atorvastatin (LIPITOR) 80 MG tablet Take 1 tablet (80 mg total) by mouth daily at 6 PM. 30 tablet 0  . diclofenac Sodium (VOLTAREN) 1 % GEL Apply 2 g topically 4 (four) times daily. 2 g 0  . feeding supplement, ENSURE ENLIVE, (ENSURE ENLIVE) LIQD Take 237 mLs by mouth 2 (two) times daily between meals. 237 mL 12  . levothyroxine (SYNTHROID) 100 MCG tablet Take 1 tablet (100 mcg total) by mouth daily at 6 (six) AM. 30 tablet 0  . polyethylene glycol (MIRALAX / GLYCOLAX) 17 g packet Take 17 g by mouth daily. (Patient taking differently: Take 17 g by mouth daily as needed for mild constipation. ) 14 each 0  . senna (SENOKOT) 8.6 MG TABS tablet Take 1 tablet (8.6 mg total) by mouth at bedtime. (Patient taking differently: Take 1 tablet by mouth at bedtime as needed for mild constipation. ) 120 tablet 0  . sodium chloride (OCEAN) 0.65 % SOLN nasal spray Place 1 spray into both nostrils as needed for congestion. 30 mL 0  . ticagrelor (BRILINTA) 90 MG TABS tablet Take 1 tablet (90 mg total) by mouth 2 (two) times daily. 60 tablet 0  . vitamin B-12 1000 MCG tablet Take 1 tablet (1,000 mcg total) by mouth daily. 30 tablet 0   No facility-administered medications prior to visit.    PAST MEDICAL HISTORY:  Past Medical History:  Diagnosis Date  . AKI (acute kidney injury) ([place]) 04/2019  . CAD (coronary artery disease)   . Carotid stenosis, left    L ICA 50%  . Hyperlipidemia   . Hypertension   . Hypertensive urgency 03/11/2019  . 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 Prediabetes   . Stroke [facility])     PAST SURGICAL HISTORY: Past Surgical History:  Procedure Laterality  Date  . CORONARY ARTERY BYPASS GRAFT  07/02/2007   [place]-> LAD SVG-> PD branch of RCA [name]) at [place] Left 03/26/2019   Procedure: EXPOSURE OF LEFT COMMON CAROTID ARTERY AND PLACEMENT OF SHEATH;  Surgeon: Marty Heck, MD;  Location: [place];  Service: Vascular;  Laterality: Left;  . ENDARTERECTOMY Left 03/26/2019   Procedure: REMOVAL OF CAROTID SHEATH AND CLOSURE OF  CAROTID ARTERY;  Surgeon: Serafina Mitchell, MD;  [facility];  Service: Vascular;  Laterality: Left;  . IR ANGIO INTRA EXTRACRAN SEL COM CAROTID INNOMINATE BILAT MOD SED  03/21/2019  . IR ANGIO INTRA EXTRACRAN SEL INTERNAL CAROTID UNI L MOD SED  03/26/2019  . IR ANGIO VERTEBRAL SEL SUBCLAVIAN INNOMINATE BILAT MOD SED  03/21/2019  . IR ANGIOGRAM FOLLOW UP STUDY  03/26/2019  . IR ANGIOGRAM FOLLOW UP STUDY  03/26/2019  . IR ANGIOGRAM FOLLOW UP STUDY  03/26/2019  . IR ANGIOGRAM FOLLOW UP STUDY  03/26/2019  . IR ANGIOGRAM FOLLOW UP STUDY  03/26/2019  . IR ANGIOGRAM FOLLOW UP STUDY  03/26/2019  . IR CT HEAD LTD  03/26/2019  . IR INTRA CRAN STENT  03/26/2019  . IR INTRAVSC STENT CERV CAROTID W/O EMB-PROT MOD SED INC ANGIO  03/26/2019  . IR NEURO EACH ADD'L AFTER BASIC UNI LEFT (MS)  03/26/2019  . IR TRANSCATH/EMBOLIZ  03/26/2019    FAMILY HISTORY: Family History  Problem Relation Age of Onset  . Stroke Mother     SOCIAL HISTORY: Social History   Socioeconomic History  . Marital status: Married    Spouse name: Mee Hives  . Number of children: 3  . Years of education: Not on file  . Highest education level: Not on file  Occupational History    Comment: retired  Tobacco Use  . Smoking status: Former Smoker    Packs/day: 0.50    Years: 45.00    Pack years: 22.50    Types: Cigarettes    Start date: 02/27/1974    Quit date: 03/11/2019    Years since quitting: 0.2  . Smokeless tobacco: Never Used  Substance and Sexual Activity  . Alcohol use: Not Currently  . Drug use: Never   . Sexual activity: Not on file  Other Topics Concern  . Not on file  Social History Narrative   06/04/19 lives with wife   Social Determinants of Health   Financial Resource Strain:   . Difficulty of Paying Living Expenses:   Food Insecurity:   . Worried About Charity fundraiser in the Last Year:   . Arboriculturist in the Last Year:   Transportation Needs:   . Film/video editor (Medical):   Marland Kitchen Lack of Transportation (Non-Medical):   Physical Activity:   . Days of Exercise per Week:   . Minutes of Exercise per Session:   Stress:   . Feeling of Stress :   Social Connections:   . Frequency of Communication with Friends and Family:   . Frequency of Social  Gatherings with Friends and Family:   . Attends Religious Services:   . Active Member of Clubs or Organizations:   . Attends Archivist Meetings:   Marland Kitchen Marital Status:   Intimate Partner Violence:   . Fear of Current or Ex-Partner:   . Emotionally Abused:   Marland Kitchen Physically Abused:   . Sexually Abused:      PHYSICAL EXAM  GENERAL EXAM/CONSTITUTIONAL: Vitals:  Vitals:   06/04/19 1111  BP: (!) 149/90  Pulse: 73  Temp: (!) 97.4 F (36.3 C)     There is no height or weight on file to calculate BMI. Wt Readings from Last 3 Encounters:  05/25/19 230 lb 13.2 oz (104.7 kg)  05/02/19 225 lb (102.1 kg)  04/20/19 221 lb 9 oz (100.5 kg)     Patient is in no distress; well developed, nourished and groomed; neck is supple  CARDIOVASCULAR:  Examination of carotid arteries is normal; no carotid bruits  Regular rate and rhythm, no murmurs  Examination of peripheral vascular system by observation and palpation is normal  EYES:  Ophthalmoscopic exam of optic discs and posterior segments is normal; no papilledema or hemorrhages  No exam data present  MUSCULOSKELETAL:  Gait, strength, tone, movements noted in Neurologic exam below  NEUROLOGIC: MENTAL STATUS:  No flowsheet data found.  awake, alert,  oriented to person, place and time  recent and remote memory intact  normal attention and concentration  language fluent, comprehension intact, naming intact  fund of knowledge appropriate  CRANIAL NERVE:   2nd - no papilledema on fundoscopic exam  2nd, 3rd, 4th, 6th - pupils equal and reactive to light, visual fields full to confrontation, extraocular muscles intact, no nystagmus  5th - facial sensation symmetric  7th - facial strength symmetric  8th - hearing intact  9th - palate elevates symmetrically, uvula midline  11th - shoulder shrug symmetric  12th - tongue protrusion midline  MOTOR:   normal bulk and tone, SYMM 4+ strength in the BUE; BLE 4  SENSORY:   normal and symmetric to light touch  COORDINATION:   finger-nose-finger, fine finger movements SLIGHTLY SLOWER ON RIGHT  REFLEXES:   deep tendon reflexes SLIGHTLY BRISK ON RIGHT SIDE  GAIT/STATION:   IN WHEELCHAIR; HAS FOLEY CATHETER     DIAGNOSTIC DATA (LABS, IMAGING, TESTING) - I reviewed patient records, labs, notes, testing and imaging myself where available.  Lab Results  Component Value Date   WBC 7.1 05/26/2019   HGB 8.1 (L) 05/26/2019   HCT 26.1 (L) 05/26/2019   MCV 90.9 05/26/2019   PLT 507 (H) 05/26/2019      Component Value Date/Time   NA 141 05/26/2019 0606   K 3.7 05/26/2019 0606   CL 110 05/26/2019 0606   CO2 23 05/26/2019 0606   GLUCOSE 100 (H) 05/26/2019 0606   BUN 34 (H) 05/26/2019 0606   CREATININE 1.47 (H) 05/26/2019 0606   CALCIUM 8.5 (L) 05/26/2019 0606   PROT 6.7 05/25/2019 0445   ALBUMIN 1.8 (L) 05/26/2019 0606   AST 55 (H) 05/25/2019 0445   ALT 43 05/25/2019 0445   ALKPHOS 192 (H) 05/25/2019 0445   BILITOT 1.0 05/25/2019 0445   GFRNONAA 48 (L) 05/26/2019 0606   GFRAA 55 (L) 05/26/2019 0606   Lab Results  Component Value Date   CHOL 243 (H) 03/21/2019   HDL 37 (L) 03/21/2019   LDLCALC 177 (H) 03/21/2019   TRIG 186 (H) 03/28/2019   CHOLHDL 6.6  03/21/2019   Lab  Results  Component Value Date   HGBA1C 6.4 (H) 03/21/2019   Lab Results  Component Value Date   VITAMINB12 142 (L) 05/08/2019   Lab Results  Component Value Date   TSH 6.549 (H) 05/08/2019       ASSESSMENT AND PLAN  69 y.o. year old male here with:  Dx:  1. Cerebrovascular accident (CVA) due to occlusion of left middle cerebral artery (HCC)      PLAN:  Left M1 high grade stenosis  CTA head and neck moderate to severe stenosis left M1 with poststenotic dilatation  IR severe left M1 90% stenosis  S/p left M1 stenting 1/27 Deveshwar  CT stable, CTA showed patent stent  continue brilinta  Left ACA pericallosal segment aneurysm  CTA head and neck 4x7  IR 9.7x7  S/p stent assisted coiling for aneurysm repair 1/27 Deveshwar  CTA head and neck - 1/28 - stable coiling and stenting   continue brilinta  Left ICA proximal stenosis  CT head and neck left ICA proximal 50% stenosis with soft plaque versus mural thrombus  IR 75% left ICA proximal stenosis  Was on Plavix and heparin IV  L CCA sheath placement in OR by VVS due to poor femoral access then to IR frontal operculum stent  S/p L ICA stent 1/27 Deveshwar, then back to OR for sheath removal and CCA closure by VVS -> JP removed today  Post IR neuro changes - R HP, resolved  CT stable and CTA neck patent stent  continue brilinta  Stroke:early/ subacute infarct with in the right cerebellar peduncle, likely small vessel disease.  TEE - OSH- Left atrial appendage thrombus  LDL -177 --> statin  HgbA1c- 6.4  VTE prophylaxis -SCDs and Heparin IV  aspirin 81 mg dailyprior to admission, now on eliquis and brilinta  LAA thrombus  TEE -1/21/2021OSH- Left atrial appendage thrombus --> continue eliquis   Hyperlipidemia  Home Lipid lowering medication:Lovaza  LDL177, goal < 70  Current lipid lowering medication:Lipitor 80 mg daily   Continue statin at  discharge  Diabetes  Home diabetic meds: none  Current diabetic meds: SSI  HgbA1c6.4, goal < 7.0  CBG monitoring  Close PCP follow-up  Return for pending if symptoms worsen or fail to improve, return to PCP.    Penni Bombard, MD [id number], [id number] AM Certified in Neurology, Neurophysiology and Neuroimaging  [facility] [address], [place], [place] [address] [phone]

## 2019-06-05 DIAGNOSIS — C9000 Multiple myeloma not having achieved remission: Secondary | ICD-10-CM | POA: Diagnosis not present

## 2019-06-17 ENCOUNTER — Encounter: Payer: Self-pay | Admitting: Physical Medicine and Rehabilitation

## 2019-06-17 ENCOUNTER — Other Ambulatory Visit: Payer: Self-pay

## 2019-06-17 ENCOUNTER — Encounter
Payer: BC Managed Care – PPO | Attending: Physical Medicine and Rehabilitation | Admitting: Physical Medicine and Rehabilitation

## 2019-06-17 DIAGNOSIS — I663 Occlusion and stenosis of cerebellar arteries: Secondary | ICD-10-CM

## 2019-06-17 DIAGNOSIS — R4189 Other symptoms and signs involving cognitive functions and awareness: Secondary | ICD-10-CM | POA: Insufficient documentation

## 2019-06-17 DIAGNOSIS — Z7409 Other reduced mobility: Secondary | ICD-10-CM | POA: Insufficient documentation

## 2019-06-17 DIAGNOSIS — Z789 Other specified health status: Secondary | ICD-10-CM | POA: Diagnosis present

## 2019-06-17 NOTE — Progress Notes (Signed)
Subjective:    Patient ID: Jeremy Sherman, male    DOB: 30-Mar-1950, 69 y.o.   MRN: [id number]  HPI  Jeremy Sherman presents for follow-up after CIR admission for right carotid artery stenosis.   Brief [id number] Jeremy Gavel Noblesis a 69 y.o.right-handed malewith history of hypertension, hyperlipidemia, prediabetes, CAD with CABG 2009, tobacco abuse. Patient lives with spouse and daughter worked full-time as a Glass blower/designer. Patient had episode of dizziness confusion 03/17/2019. He went to his PCPs office became unresponsive hypotensive and diaphoretic. Blood pressure reportedly lower than 100 he was brought by EMS to [facility]. Patient had recent been hospitalized at [facility] 03/11/2019 to 03/13/2019 for hypertensive urgency. 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 TEE at outside hospital showed left atrial appendage thrombus was placed on intravenous heparin. He was transferred to [facility]. 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 Oneida Alar 03/26/2019. Found to have right hemiparesis following procedure. Follow-up imaging showed early subacute infarction in the right cerebellar peduncle likely small vessel disease. He did remain intubated through 03/27/2019. He had been receiving Cleviprex for blood pressure control. Initially on intravenous heparin transition to Eliquis as well as Brilinta. Patient with persistent low-grade fever urinalysis 03/29/2019 + nitrite placed on Levaquin x5 doses that has since been completed. Maintained on dysphagia #1 thin liquid diet. Hospital course findings of elevated TSH level 45.808  placed on Synthroid. Therapy evaluations completed and patient was admitted for a comprehensive rehab program   Hospital Course:Jeremy Frizzelle Nobleswas admitted to rehab 2/4/2021for inpatient therapies to consist of PT, ST and OT at least three hours five days a week. Past admission physiatrist, therapy team and rehab RN have worked together to provide customized collaborative inpatient rehab.Hospital course pertaining to patient's left ACA pericallosal segment aneurysm underwent stenting coiling as well as left ICA proximal stenosis with CEA followed by stent angioplasty 03/26/2019 per interventional radiology postoperative right cerebellar peduncle infarction patient remained on Eliquis as well as Brilinta. He would follow-up neurology services as well as interventional radiology. Patient's blood pressure was currently maintained on no antihypertensive medications. Lipitor for hyperlipidemia. History of tobacco abuse he received counts regards to cessation of nicotine products. Diabetes mellitus hemoglobin A1c 6.4 diabetic teaching. His diet was slowly advanced to mechanical soft. Patient was noted significant onychogryphosis/mycosis he would need follow-up outpatient with podiatry services. In regards to patient's hypothyroidism. Noted elevated TSH levels maintained on Synthroid he would need follow-up thyroid panel as outpatient. In regards to patient's BPH difficulty in and out catheterizations with some hematuria related to anticoagulation in light of retention a Foley catheter tube was placed outpatient referral obtained for urology services for follow-up and Flomax had been discontinued due to orthostasis.  Jeremy Sherman has been receiving home PT and OT. He did stand and ambulate with therapy but does not usually when it is just his wife home with him given his size and weakness. She does have the support of her children and they can help her if needed. He continues to have  cognitive deficits and memory impairments and his wife provides all history. He enjoys working and operates [employer] says he is the only one who can do  what he does but that they will need to find someone else now. His wife asks if I may fill out his short-term disability paperwork and provide documents for his insurance claim. All medications are reviewed and he is taking them as prescribed. He no longer has foot pain and is now off the Tramadol. He has been having some incontinence of bowel and bladder and has diapers.     Pain Inventory Average Pain 0 Pain Right Now 0 My pain is no pain  In the last 24 hours, has pain interfered with the following? General activity 0 Relation with others 0 Enjoyment of life 0 What TIME of day is your pain at its worst? no pain Sleep (in general) Fair  Pain is worse with: no pain Pain improves with: no pain Relief from Meds: no pain  Mobility walk with assistance use a walker ability to climb steps?  no do you drive?  no use a wheelchair needs help with transfers  Function I need assistance with the following:  dressing and bathing  Neuro/Psych bladder control problems bowel control problems weakness trouble walking  Prior Studies Any changes since last visit?  no  Physicians involved in your care Any changes since last visit?  no   Family History  Problem Relation Age of Onset  . Stroke Jeremy Sherman    Social History   Socioeconomic History  . Marital status: Married    Spouse name: Jeremy Sherman  . Number of children: 3  . Years of education: Not on file  . Highest education level: Not on file  Occupational History    Comment: retired  Tobacco Use  . Smoking status: Former Smoker    Packs/day: 0.50    Years: 45.00    Pack years: 22.50    Types: Cigarettes    Start date: 02/27/1974    Quit date: 03/11/2019    Years since quitting: 0.2  . Smokeless tobacco: Never Used  Substance and Sexual Activity  . Alcohol use: Not  Currently  . Drug use: Never  . Sexual activity: Not on file  Other Topics Concern  . Not on file  Social History Narrative   06/04/19 lives with wife   Social Determinants of Health   Financial Resource Strain:   . Difficulty of Paying Living Expenses:   Food Insecurity:   . Worried About Charity fundraiser in the Last Year:   . Arboriculturist in the Last Year:   Transportation Needs:   . Film/video editor (Medical):   Marland Kitchen Lack of Transportation (Non-Medical):   Physical Activity:   . Days of Exercise per Week:   . Minutes of Exercise per Session:   Stress:   . Feeling of Stress :   Social Connections:   . Frequency of Communication with Friends and Family:   . Frequency of Social Gatherings with Friends and Family:   . Attends Religious Services:   . Active Member of Clubs or Organizations:   . Attends Archivist Meetings:   Marland Kitchen Marital Status:    Past Surgical History:  Procedure Laterality Date  . CORONARY ARTERY BYPASS GRAFT  07/02/2007   [place]-> LAD SVG-> PD branch of RCA [name]) at [place] Left 03/26/2019   Procedure: EXPOSURE OF LEFT COMMON CAROTID ARTERY AND PLACEMENT OF SHEATH;  Surgeon: Marty Heck, MD;  Location: [place];  Service: Vascular;  Laterality: Left;  . ENDARTERECTOMY Left 03/26/2019  Procedure: REMOVAL OF CAROTID SHEATH AND CLOSURE OF  CAROTID ARTERY;  Surgeon: Serafina Mitchell, MD;  [facility];  Service: Vascular;  Laterality: Left;  . IR ANGIO INTRA EXTRACRAN SEL COM CAROTID INNOMINATE BILAT MOD SED  03/21/2019  . IR ANGIO INTRA EXTRACRAN SEL INTERNAL CAROTID UNI L MOD SED  03/26/2019  . IR ANGIO VERTEBRAL SEL SUBCLAVIAN INNOMINATE BILAT MOD SED  03/21/2019  . IR ANGIOGRAM FOLLOW UP STUDY  03/26/2019  . IR ANGIOGRAM FOLLOW UP STUDY  03/26/2019  . IR ANGIOGRAM FOLLOW UP STUDY  03/26/2019  . IR ANGIOGRAM FOLLOW UP STUDY  03/26/2019  . IR ANGIOGRAM FOLLOW UP STUDY  03/26/2019  . IR  ANGIOGRAM FOLLOW UP STUDY  03/26/2019  . IR CT HEAD LTD  03/26/2019  . IR INTRA CRAN STENT  03/26/2019  . IR INTRAVSC STENT CERV CAROTID W/O EMB-PROT MOD SED INC ANGIO  03/26/2019  . IR NEURO EACH ADD'L AFTER BASIC UNI LEFT (MS)  03/26/2019  . IR TRANSCATH/EMBOLIZ  03/26/2019   Past Medical History:  Diagnosis Date  . AKI (acute kidney injury) ([place]) 04/2019  . CAD (coronary artery disease)   . Carotid stenosis, left    L ICA 50%  . Hyperlipidemia   . Hypertension   . Hypertensive urgency 03/11/2019  . 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 Prediabetes   . Stroke [facility])    There were no vitals taken for this visit.  Opioid Risk Score:   Fall Risk Score:  `1  Depression screen PHQ 2/9  No flowsheet data found.  Review of Systems  Constitutional: Negative.   HENT: Negative.   Eyes: Negative.   Respiratory: Negative.   Cardiovascular: Negative.   Gastrointestinal: Negative.   Endocrine: Negative.   Genitourinary: Negative.   Musculoskeletal: Positive for gait problem.  Skin: Negative.   Allergic/Immunologic: Negative.   Neurological: Positive for weakness.  Hematological: Negative.   Psychiatric/Behavioral: Negative.   All other systems reviewed and are negative.      Objective:   Physical Exam Constitutional: No distress . Vital signs reviewed. Appears fatigued HENT: Normocephalic. Atraumatic. Eyes: EOMI. No discharge. Cardiovascular: No JVD.RRR no murmur  Respiratory: Normal effort. No stridor.No rales or rhonchi  GI: Non-distended.+ BS soft , NT  Skin: Warm and dry. Intact. Psych: Normal mood. Normal behavior.Unable to provide much history. Musc: No edema in extremities. No tenderness in extremities. Neurologic: Alert Motor: Bilateral upper extremities: 5/5 proximal distal Bilateral lower extremities: 4-/5 proximal to distal Seated in WC. Left foot rest of wheelchair is turned outward.     Assessment & Plan:  1. Right side weakness with dysphagia  secondary to left ACA pericallosal segment aneurysm S/P stent assisted coiling as well as left ICA proximal stenosis S/P CEA followed by stent angioplasty 03/26/2019 per interventional radiology with post procedure right cerebellar peduncle infarction  Pressure ulcer has resolved.            Continue home PT and OT. Advised that he perform HEP every day he does not have therapy and that wife assist him in ambulation around the home as much as possible when he does not have therapy. Advised children help wife to assist patient in ambulation so he can maintain his strength. Advised regarding exercises he can perform himself at home. Recommend q2H repositioning to prevent new pressure ulcer formation.   2. Antithrombotics:  -Continue Brilinta and Eliquis. Does not require refills.  3. Pain Management: Pain has resolved and he is no  longer requiring Tramadol.  4. Elevated blood pressure readings in CIR: Much better controlled in office today, but still elevated. Advised that he follow with PCP.   5. Impaired mobility and cognition: patient is not safe currently to return to work and operate machinery. I have filled out his short term disability paperwork and provides the records he needs for his insurance claim given the long-lasting effects of his brain injury. Provided with handicap placard, 5 year, no need for renewal  20 minutes of face to face patient care time were spent during this visit. All questions were encouraged and answered.  Follow up with me PRN.

## 2019-07-02 ENCOUNTER — Encounter (HOSPITAL_COMMUNITY): Payer: Self-pay | Admitting: Internal Medicine

## 2019-07-09 ENCOUNTER — Ambulatory Visit (INDEPENDENT_AMBULATORY_CARE_PROVIDER_SITE_OTHER): Payer: BC Managed Care – PPO | Admitting: Sports Medicine

## 2019-07-09 ENCOUNTER — Ambulatory Visit: Payer: Medicare Other | Admitting: Sports Medicine

## 2019-07-09 ENCOUNTER — Other Ambulatory Visit: Payer: Self-pay

## 2019-07-09 ENCOUNTER — Encounter: Payer: Self-pay | Admitting: Sports Medicine

## 2019-07-09 DIAGNOSIS — Z8673 Personal history of transient ischemic attack (TIA), and cerebral infarction without residual deficits: Secondary | ICD-10-CM

## 2019-07-09 DIAGNOSIS — E1169 Type 2 diabetes mellitus with other specified complication: Secondary | ICD-10-CM | POA: Diagnosis not present

## 2019-07-09 DIAGNOSIS — L602 Onychogryphosis: Secondary | ICD-10-CM

## 2019-07-09 DIAGNOSIS — E669 Obesity, unspecified: Secondary | ICD-10-CM

## 2019-07-09 DIAGNOSIS — M79674 Pain in right toe(s): Secondary | ICD-10-CM | POA: Diagnosis not present

## 2019-07-09 DIAGNOSIS — B351 Tinea unguium: Secondary | ICD-10-CM

## 2019-07-09 DIAGNOSIS — M79675 Pain in left toe(s): Secondary | ICD-10-CM

## 2019-07-09 DIAGNOSIS — L853 Xerosis cutis: Secondary | ICD-10-CM

## 2019-07-09 DIAGNOSIS — Z7901 Long term (current) use of anticoagulants: Secondary | ICD-10-CM

## 2019-07-09 NOTE — Progress Notes (Signed)
Subjective: Jeremy Sherman is a 69 y.o. male patient with history of stroke on Eliquis is here today for nail trim.  Patient is assisted by wife who helps to care for him who reports that he has not had any problems but has noticed more dry skin building up in between the toes.  No other pedal complaints noted.  Meds and problem history as listed in chart  No Known Allergies   Objective: General: Patient is awake, alert, and oriented x 2 and crying during the visit  Integument: Skin is warm, dry and supple bilateral. Nails are tender, moderately long, thickened and dystrophic with severe subungual debris, consistent with onychomycosis, 1-5 bilateral, less curled over in a rams horn's appearance. No signs of infection. No open lesions or preulcerative lesions present bilateral.  Dry skin diffusely bilateral with scaling and lifting of the stop scales in between the toes.  Remaining integument unremarkable.  Vasculature:  Dorsalis Pedis pulse 1/4 bilateral. Posterior Tibial pulse  1/4 bilateral.  Capillary fill time <5 sec 1-5 bilateral.  No hair growth to the level of the digits. Temperature gradient within normal limits. No varicosities present bilateral.  Trace edema present bilateral.   Neurology: Hypersensitivity to light touch worse on right foot compared to left with history of stroke with right-sided weakness like before  Musculoskeletal: Pes planus foot type with hammertoe deformity there is decreased pain palpation to toenails especially right foot.  Assessment and Plan: Problem List Items Addressed This Visit      Endocrine   Diabetes mellitus type 2 in obese [facility])    Other Visit Diagnoses    Pain due to onychomycosis of toenails of both feet    -  Primary   Onychogryposis of toenail       Dry skin       Current use of anticoagulant therapy       History of stroke         -Examined patient. -Re-discussed and educated patient and wife on proper foot care,  especially with  regards to the vascular, neurological and musculoskeletal systems.  -Mechanically debrided all nails 1-5 bilateral using sterile nail nipper and filed with dremel without incident to patient's tolerance -Recommend once weekly soaks for the skin areas and Vicks VapoRub as previously recommended with your wife says she forgot to do but will try soon -Answered all patient questions -Patient to return  in 62 days for at risk foot care like before -Patient advised to call the office if any problems or questions arise in the meantime.  Jeremy Sherman, DPM

## 2019-07-21 DIAGNOSIS — C9000 Multiple myeloma not having achieved remission: Secondary | ICD-10-CM

## 2019-08-01 ENCOUNTER — Emergency Department (HOSPITAL_COMMUNITY): Payer: BC Managed Care – PPO

## 2019-08-01 ENCOUNTER — Inpatient Hospital Stay (HOSPITAL_COMMUNITY): Payer: BC Managed Care – PPO

## 2019-08-01 ENCOUNTER — Observation Stay (HOSPITAL_COMMUNITY)
Admission: EM | Admit: 2019-08-01 | Discharge: 2019-08-28 | DRG: 309 | Disposition: E | Payer: BC Managed Care – PPO | Attending: Critical Care Medicine | Admitting: Critical Care Medicine

## 2019-08-01 ENCOUNTER — Encounter (HOSPITAL_COMMUNITY): Payer: Self-pay

## 2019-08-01 ENCOUNTER — Inpatient Hospital Stay (HOSPITAL_COMMUNITY): Admission: EM | Disposition: E | Payer: Self-pay | Source: Home / Self Care | Attending: Emergency Medicine

## 2019-08-01 ENCOUNTER — Other Ambulatory Visit: Payer: Self-pay | Admitting: *Deleted

## 2019-08-01 DIAGNOSIS — I671 Cerebral aneurysm, nonruptured: Secondary | ICD-10-CM | POA: Diagnosis not present

## 2019-08-01 DIAGNOSIS — W06XXXA Fall from bed, initial encounter: Secondary | ICD-10-CM | POA: Diagnosis present

## 2019-08-01 DIAGNOSIS — Z20822 Contact with and (suspected) exposure to covid-19: Secondary | ICD-10-CM | POA: Diagnosis not present

## 2019-08-01 DIAGNOSIS — R570 Cardiogenic shock: Secondary | ICD-10-CM | POA: Insufficient documentation

## 2019-08-01 DIAGNOSIS — I6609 Occlusion and stenosis of unspecified middle cerebral artery: Secondary | ICD-10-CM | POA: Diagnosis present

## 2019-08-01 DIAGNOSIS — E039 Hypothyroidism, unspecified: Secondary | ICD-10-CM | POA: Diagnosis not present

## 2019-08-01 DIAGNOSIS — I6601 Occlusion and stenosis of right middle cerebral artery: Secondary | ICD-10-CM

## 2019-08-01 DIAGNOSIS — I2510 Atherosclerotic heart disease of native coronary artery without angina pectoris: Secondary | ICD-10-CM | POA: Diagnosis not present

## 2019-08-01 DIAGNOSIS — E119 Type 2 diabetes mellitus without complications: Secondary | ICD-10-CM | POA: Diagnosis not present

## 2019-08-01 DIAGNOSIS — Z978 Presence of other specified devices: Secondary | ICD-10-CM

## 2019-08-01 DIAGNOSIS — I69351 Hemiplegia and hemiparesis following cerebral infarction affecting right dominant side: Secondary | ICD-10-CM

## 2019-08-01 DIAGNOSIS — C9000 Multiple myeloma not having achieved remission: Secondary | ICD-10-CM | POA: Diagnosis not present

## 2019-08-01 DIAGNOSIS — I5032 Chronic diastolic (congestive) heart failure: Secondary | ICD-10-CM | POA: Insufficient documentation

## 2019-08-01 DIAGNOSIS — G9340 Encephalopathy, unspecified: Secondary | ICD-10-CM | POA: Insufficient documentation

## 2019-08-01 DIAGNOSIS — I10 Essential (primary) hypertension: Secondary | ICD-10-CM | POA: Diagnosis present

## 2019-08-01 DIAGNOSIS — I513 Intracardiac thrombosis, not elsewhere classified: Secondary | ICD-10-CM | POA: Insufficient documentation

## 2019-08-01 DIAGNOSIS — Z79890 Hormone replacement therapy (postmenopausal): Secondary | ICD-10-CM | POA: Insufficient documentation

## 2019-08-01 DIAGNOSIS — I663 Occlusion and stenosis of cerebellar arteries: Secondary | ICD-10-CM | POA: Diagnosis not present

## 2019-08-01 DIAGNOSIS — G253 Myoclonus: Secondary | ICD-10-CM | POA: Insufficient documentation

## 2019-08-01 DIAGNOSIS — I69391 Dysphagia following cerebral infarction: Secondary | ICD-10-CM | POA: Insufficient documentation

## 2019-08-01 DIAGNOSIS — I519 Heart disease, unspecified: Secondary | ICD-10-CM

## 2019-08-01 DIAGNOSIS — Z66 Do not resuscitate: Secondary | ICD-10-CM | POA: Diagnosis not present

## 2019-08-01 DIAGNOSIS — E669 Obesity, unspecified: Secondary | ICD-10-CM | POA: Diagnosis present

## 2019-08-01 DIAGNOSIS — G931 Anoxic brain damage, not elsewhere classified: Secondary | ICD-10-CM | POA: Insufficient documentation

## 2019-08-01 DIAGNOSIS — R1310 Dysphagia, unspecified: Secondary | ICD-10-CM | POA: Insufficient documentation

## 2019-08-01 DIAGNOSIS — Z951 Presence of aortocoronary bypass graft: Secondary | ICD-10-CM | POA: Insufficient documentation

## 2019-08-01 DIAGNOSIS — I249 Acute ischemic heart disease, unspecified: Secondary | ICD-10-CM | POA: Clinically undetermined

## 2019-08-01 DIAGNOSIS — I252 Old myocardial infarction: Secondary | ICD-10-CM | POA: Diagnosis not present

## 2019-08-01 DIAGNOSIS — E876 Hypokalemia: Secondary | ICD-10-CM | POA: Insufficient documentation

## 2019-08-01 DIAGNOSIS — I110 Hypertensive heart disease with heart failure: Secondary | ICD-10-CM | POA: Insufficient documentation

## 2019-08-01 DIAGNOSIS — I472 Ventricular tachycardia: Secondary | ICD-10-CM | POA: Diagnosis present

## 2019-08-01 DIAGNOSIS — E872 Acidosis: Secondary | ICD-10-CM

## 2019-08-01 DIAGNOSIS — J9601 Acute respiratory failure with hypoxia: Secondary | ICD-10-CM

## 2019-08-01 DIAGNOSIS — Z7902 Long term (current) use of antithrombotics/antiplatelets: Secondary | ICD-10-CM | POA: Insufficient documentation

## 2019-08-01 DIAGNOSIS — R0689 Other abnormalities of breathing: Secondary | ICD-10-CM | POA: Insufficient documentation

## 2019-08-01 DIAGNOSIS — E785 Hyperlipidemia, unspecified: Secondary | ICD-10-CM | POA: Insufficient documentation

## 2019-08-01 DIAGNOSIS — K5900 Constipation, unspecified: Secondary | ICD-10-CM | POA: Diagnosis not present

## 2019-08-01 DIAGNOSIS — Z823 Family history of stroke: Secondary | ICD-10-CM | POA: Insufficient documentation

## 2019-08-01 DIAGNOSIS — I6522 Occlusion and stenosis of left carotid artery: Secondary | ICD-10-CM | POA: Insufficient documentation

## 2019-08-01 DIAGNOSIS — R569 Unspecified convulsions: Secondary | ICD-10-CM | POA: Diagnosis present

## 2019-08-01 DIAGNOSIS — I469 Cardiac arrest, cause unspecified: Secondary | ICD-10-CM | POA: Diagnosis present

## 2019-08-01 DIAGNOSIS — I5189 Other ill-defined heart diseases: Secondary | ICD-10-CM | POA: Diagnosis present

## 2019-08-01 DIAGNOSIS — I4891 Unspecified atrial fibrillation: Secondary | ICD-10-CM | POA: Diagnosis not present

## 2019-08-01 DIAGNOSIS — N400 Enlarged prostate without lower urinary tract symptoms: Secondary | ICD-10-CM | POA: Diagnosis present

## 2019-08-01 DIAGNOSIS — M25562 Pain in left knee: Secondary | ICD-10-CM

## 2019-08-01 DIAGNOSIS — Z6829 Body mass index (BMI) 29.0-29.9, adult: Secondary | ICD-10-CM

## 2019-08-01 DIAGNOSIS — Z79899 Other long term (current) drug therapy: Secondary | ICD-10-CM | POA: Insufficient documentation

## 2019-08-01 DIAGNOSIS — I4901 Ventricular fibrillation: Principal | ICD-10-CM | POA: Diagnosis present

## 2019-08-01 DIAGNOSIS — R159 Full incontinence of feces: Secondary | ICD-10-CM | POA: Diagnosis present

## 2019-08-01 DIAGNOSIS — Z87891 Personal history of nicotine dependence: Secondary | ICD-10-CM | POA: Insufficient documentation

## 2019-08-01 DIAGNOSIS — I462 Cardiac arrest due to underlying cardiac condition: Secondary | ICD-10-CM | POA: Diagnosis present

## 2019-08-01 DIAGNOSIS — Z8673 Personal history of transient ischemic attack (TIA), and cerebral infarction without residual deficits: Secondary | ICD-10-CM

## 2019-08-01 DIAGNOSIS — Z7901 Long term (current) use of anticoagulants: Secondary | ICD-10-CM | POA: Insufficient documentation

## 2019-08-01 LAB — CBC WITH DIFFERENTIAL/PLATELET
Abs Immature Granulocytes: 0.09 10*3/uL — ABNORMAL HIGH (ref 0.00–0.07)
Basophils Absolute: 0 10*3/uL (ref 0.0–0.1)
Basophils Relative: 0 %
Eosinophils Absolute: 0.1 10*3/uL (ref 0.0–0.5)
Eosinophils Relative: 1 %
HCT: 29 % — ABNORMAL LOW (ref 39.0–52.0)
Hemoglobin: 8.7 g/dL — ABNORMAL LOW (ref 13.0–17.0)
Immature Granulocytes: 1 %
Lymphocytes Relative: 21 %
Lymphs Abs: 1.3 10*3/uL (ref 0.7–4.0)
MCH: 29.5 pg (ref 26.0–34.0)
MCHC: 30 g/dL (ref 30.0–36.0)
MCV: 98.3 fL (ref 80.0–100.0)
Monocytes Absolute: 0.4 10*3/uL (ref 0.1–1.0)
Monocytes Relative: 6 %
Neutro Abs: 4.4 10*3/uL (ref 1.7–7.7)
Neutrophils Relative %: 71 %
Platelets: 231 10*3/uL (ref 150–400)
RBC: 2.95 MIL/uL — ABNORMAL LOW (ref 4.22–5.81)
RDW: 18.4 % — ABNORMAL HIGH (ref 11.5–15.5)
WBC: 6.2 10*3/uL (ref 4.0–10.5)
nRBC: 0.3 % — ABNORMAL HIGH (ref 0.0–0.2)

## 2019-08-01 LAB — COMPREHENSIVE METABOLIC PANEL
ALT: 32 U/L (ref 0–44)
AST: 34 U/L (ref 15–41)
Albumin: 2.1 g/dL — ABNORMAL LOW (ref 3.5–5.0)
Alkaline Phosphatase: 67 U/L (ref 38–126)
Anion gap: 17 — ABNORMAL HIGH (ref 5–15)
BUN: 36 mg/dL — ABNORMAL HIGH (ref 8–23)
CO2: 16 mmol/L — ABNORMAL LOW (ref 22–32)
Calcium: 8.1 mg/dL — ABNORMAL LOW (ref 8.9–10.3)
Chloride: 107 mmol/L (ref 98–111)
Creatinine, Ser: 1.47 mg/dL — ABNORMAL HIGH (ref 0.61–1.24)
GFR calc Af Amer: 55 mL/min — ABNORMAL LOW (ref >60)
GFR calc non Af Amer: 48 mL/min — ABNORMAL LOW (ref >60)
Glucose, Bld: 282 mg/dL — ABNORMAL HIGH (ref 70–99)
Potassium: 3.6 mmol/L (ref 3.5–5.1)
Sodium: 140 mmol/L (ref 135–145)
Total Bilirubin: 0.9 mg/dL (ref 0.3–1.2)
Total Protein: 5.4 g/dL — ABNORMAL LOW (ref 6.5–8.1)

## 2019-08-01 LAB — MRSA PCR SCREENING: MRSA by PCR: NEGATIVE

## 2019-08-01 LAB — I-STAT ARTERIAL BLOOD GAS, ED
Acid-base deficit: 7 mmol/L — ABNORMAL HIGH (ref 0.0–2.0)
Bicarbonate: 18 mmol/L — ABNORMAL LOW (ref 20.0–28.0)
Calcium, Ion: 1.21 mmol/L (ref 1.15–1.40)
HCT: 26 % — ABNORMAL LOW (ref 39.0–52.0)
Hemoglobin: 8.8 g/dL — ABNORMAL LOW (ref 13.0–17.0)
O2 Saturation: 100 %
Patient temperature: 95.4
Potassium: 3.2 mmol/L — ABNORMAL LOW (ref 3.5–5.1)
Sodium: 142 mmol/L (ref 135–145)
TCO2: 19 mmol/L — ABNORMAL LOW (ref 22–32)
pCO2 arterial: 31.1 mmHg — ABNORMAL LOW (ref 32.0–48.0)
pH, Arterial: 7.361 (ref 7.350–7.450)
pO2, Arterial: 476 mmHg — ABNORMAL HIGH (ref 83.0–108.0)

## 2019-08-01 LAB — BASIC METABOLIC PANEL
Anion gap: 18 — ABNORMAL HIGH (ref 5–15)
Anion gap: 23 — ABNORMAL HIGH (ref 5–15)
BUN: 38 mg/dL — ABNORMAL HIGH (ref 8–23)
BUN: 47 mg/dL — ABNORMAL HIGH (ref 8–23)
CO2: 18 mmol/L — ABNORMAL LOW (ref 22–32)
CO2: 15 mmol/L — ABNORMAL LOW (ref 22–32)
Calcium: 8.4 mg/dL — ABNORMAL LOW (ref 8.9–10.3)
Calcium: 8.2 mg/dL — ABNORMAL LOW (ref 8.9–10.3)
Chloride: 106 mmol/L (ref 98–111)
Chloride: 102 mmol/L (ref 98–111)
Creatinine, Ser: 1.41 mg/dL — ABNORMAL HIGH (ref 0.61–1.24)
Creatinine, Ser: 1.9 mg/dL — ABNORMAL HIGH (ref 0.61–1.24)
GFR calc Af Amer: 58 mL/min — ABNORMAL LOW (ref >60)
GFR calc Af Amer: 40 mL/min — ABNORMAL LOW (ref >60)
GFR calc non Af Amer: 50 mL/min — ABNORMAL LOW (ref >60)
GFR calc non Af Amer: 35 mL/min — ABNORMAL LOW (ref >60)
Glucose, Bld: 322 mg/dL — ABNORMAL HIGH (ref 70–99)
Glucose, Bld: 411 mg/dL — ABNORMAL HIGH (ref 70–99)
Potassium: 3.5 mmol/L (ref 3.5–5.1)
Potassium: 3.5 mmol/L (ref 3.5–5.1)
Sodium: 142 mmol/L (ref 135–145)
Sodium: 140 mmol/L (ref 135–145)

## 2019-08-01 LAB — TROPONIN I (HIGH SENSITIVITY)
Troponin I (High Sensitivity): 126 ng/L (ref <18)
Troponin I (High Sensitivity): 3997 ng/L (ref <18)

## 2019-08-01 LAB — GLUCOSE, CAPILLARY
Glucose-Capillary: 295 mg/dL — ABNORMAL HIGH (ref 70–99)
Glucose-Capillary: 462 mg/dL — ABNORMAL HIGH (ref 70–99)
Glucose-Capillary: 445 mg/dL — ABNORMAL HIGH (ref 70–99)
Glucose-Capillary: 440 mg/dL — ABNORMAL HIGH (ref 70–99)
Glucose-Capillary: 306 mg/dL — ABNORMAL HIGH (ref 70–99)

## 2019-08-01 LAB — HEMOGLOBIN A1C
Hgb A1c MFr Bld: 5.4 % (ref 4.8–5.6)
Mean Plasma Glucose: 108.28 mg/dL

## 2019-08-01 LAB — LACTIC ACID, PLASMA
Lactic Acid, Venous: 8.9 mmol/L (ref 0.5–1.9)
Lactic Acid, Venous: 9.3 mmol/L (ref 0.5–1.9)

## 2019-08-01 LAB — PROTIME-INR
INR: 1.7 — ABNORMAL HIGH (ref 0.8–1.2)
INR: 1.6 — ABNORMAL HIGH (ref 0.8–1.2)
Prothrombin Time: 19.4 seconds — ABNORMAL HIGH (ref 11.4–15.2)
Prothrombin Time: 18.3 seconds — ABNORMAL HIGH (ref 11.4–15.2)

## 2019-08-01 LAB — APTT
aPTT: 36 seconds (ref 24–36)
aPTT: 102 seconds — ABNORMAL HIGH (ref 24–36)

## 2019-08-01 LAB — ECHOCARDIOGRAM COMPLETE: Height: 74 in

## 2019-08-01 LAB — SARS CORONAVIRUS 2 BY RT PCR (HOSPITAL ORDER, PERFORMED IN ~~LOC~~ HOSPITAL LAB): SARS Coronavirus 2: NEGATIVE

## 2019-08-01 LAB — HEPARIN LEVEL (UNFRACTIONATED): Heparin Unfractionated: >2.2 IU/mL — ABNORMAL HIGH (ref 0.30–0.70)

## 2019-08-01 SURGERY — CORONARY/GRAFT ACUTE MI REVASCULARIZATION
Anesthesia: LOCAL

## 2019-08-01 MED ORDER — EPINEPHRINE HCL 5 MG/250ML IV SOLN IN NS
INTRAVENOUS | Status: AC
  Administered 2019-08-01: 10 mg via INTRAVENOUS
  Filled 2019-08-01: qty 250

## 2019-08-01 MED ORDER — ORAL CARE MOUTH RINSE
15.0000 mL | OROMUCOSAL | Status: DC
Start: 2019-08-01 — End: 2019-08-01

## 2019-08-01 MED ORDER — ETOMIDATE 2 MG/ML IV SOLN
INTRAVENOUS | Status: AC | PRN
  Administered 2019-08-01: 20 mg via INTRAVENOUS

## 2019-08-01 MED ORDER — HEPARIN SODIUM (PORCINE) 5000 UNIT/ML IJ SOLN
INTRAMUSCULAR | Status: AC
  Filled 2019-08-01: qty 1

## 2019-08-01 MED ORDER — SODIUM BICARBONATE-DEXTROSE 150-5 MEQ/L-% IV SOLN
150.0000 meq | INTRAVENOUS | Status: DC
  Filled 2019-08-01: qty 1000

## 2019-08-01 MED ORDER — SODIUM CHLORIDE 0.9 % IV SOLN: INTRAVENOUS | Status: DC

## 2019-08-01 MED ORDER — CHLORHEXIDINE GLUCONATE CLOTH 2 % EX PADS: 6.0000 | MEDICATED_PAD | Freq: Every day | CUTANEOUS | Status: DC

## 2019-08-01 MED ORDER — AMIODARONE HCL IN DEXTROSE 360-4.14 MG/200ML-% IV SOLN
INTRAVENOUS | Status: AC
  Administered 2019-08-01: 60 mg/h via INTRAVENOUS
  Filled 2019-08-01: qty 200

## 2019-08-01 MED ORDER — NOREPINEPHRINE BITARTRATE 1 MG/ML IV SOLN
INTRAVENOUS | Status: DC | PRN
  Administered 2019-08-01: 4 ug/kg/min via INTRAVENOUS

## 2019-08-01 MED ORDER — INSULIN ASPART 100 UNIT/ML ~~LOC~~ SOLN
0.0000 [IU] | SUBCUTANEOUS | Status: DC
  Administered 2019-08-01: 15 [IU] via SUBCUTANEOUS
  Administered 2019-08-01: 8 [IU] via SUBCUTANEOUS
  Administered 2019-08-01: 15 [IU] via SUBCUTANEOUS

## 2019-08-01 MED ORDER — SODIUM BICARBONATE 8.4 % IV SOLN
INTRAVENOUS | Status: DC | PRN
  Administered 2019-08-01 (×2): 100 meq via INTRAVENOUS

## 2019-08-01 MED ORDER — LEVOTHYROXINE SODIUM 25 MCG PO TABS
100.0000 ug | ORAL_TABLET | Freq: Every day | ORAL | Status: DC
  Administered 2019-08-01: 100 ug
  Filled 2019-08-01 (×2): qty 4

## 2019-08-01 MED ORDER — HEPARIN (PORCINE) 25000 UT/250ML-% IV SOLN
750.0000 [IU]/h | INTRAVENOUS | Status: DC
  Filled 2019-08-01: qty 250

## 2019-08-01 MED ORDER — ROCURONIUM BROMIDE 50 MG/5ML IV SOLN
INTRAVENOUS | Status: AC | PRN
  Administered 2019-08-01: 100 mg via INTRAVENOUS

## 2019-08-01 MED ORDER — HEPARIN SODIUM (PORCINE) 1000 UNIT/ML IJ SOLN
INTRAMUSCULAR | Status: AC
  Filled 2019-08-01: qty 1

## 2019-08-01 MED ORDER — IOHEXOL 350 MG/ML SOLN
INTRAVENOUS | Status: AC
  Filled 2019-08-01: qty 1

## 2019-08-01 MED ORDER — MIDAZOLAM HCL 2 MG/2ML IJ SOLN
1.0000 mg | INTRAMUSCULAR | Status: DC | PRN
  Administered 2019-08-01 (×2): 2 mg via INTRAVENOUS
  Filled 2019-08-01 (×2): qty 2

## 2019-08-01 MED ORDER — EPINEPHRINE HCL 5 MG/250ML IV SOLN IN NS
0.5000 ug/min | INTRAVENOUS | Status: DC
  Administered 2019-08-01 (×2): 10 ug/min via INTRAVENOUS
  Filled 2019-08-01 (×2): qty 250

## 2019-08-01 MED ORDER — DOCUSATE SODIUM 50 MG/5ML PO LIQD
100.0000 mg | Freq: Two times a day (BID) | ORAL | Status: DC
  Administered 2019-08-01: 100 mg
  Filled 2019-08-01: qty 10

## 2019-08-01 MED ORDER — AMIODARONE HCL IN DEXTROSE 360-4.14 MG/200ML-% IV SOLN: 60.0000 mg/h | INTRAVENOUS | Status: AC

## 2019-08-01 MED ORDER — POTASSIUM CHLORIDE 20 MEQ/15ML (10%) PO SOLN
40.0000 meq | Freq: Once | ORAL | Status: AC
  Administered 2019-08-01: 40 meq
  Filled 2019-08-01: qty 30

## 2019-08-01 MED ORDER — AMIODARONE HCL IN DEXTROSE 360-4.14 MG/200ML-% IV SOLN
30.0000 mg/h | INTRAVENOUS | Status: DC
  Administered 2019-08-01: 30 mg/h via INTRAVENOUS
  Filled 2019-08-01: qty 200

## 2019-08-01 MED ORDER — CHLORHEXIDINE GLUCONATE 0.12% ORAL RINSE (MEDLINE KIT)
15.0000 mL | Freq: Two times a day (BID) | OROMUCOSAL | Status: DC
  Administered 2019-08-01 (×2): 15 mL via OROMUCOSAL

## 2019-08-01 MED ORDER — MIDAZOLAM HCL 2 MG/2ML IJ SOLN: 1.0000 mg | INTRAMUSCULAR | Status: DC | PRN

## 2019-08-01 MED ORDER — LIDOCAINE HCL (PF) 1 % IJ SOLN
INTRAMUSCULAR | Status: AC
  Filled 2019-08-01: qty 30

## 2019-08-01 MED ORDER — VERAPAMIL HCL 2.5 MG/ML IV SOLN
INTRAVENOUS | Status: AC
  Filled 2019-08-01: qty 2

## 2019-08-01 MED ORDER — LIDOCAINE HCL (CARDIAC) PF 100 MG/5ML IV SOSY
PREFILLED_SYRINGE | INTRAVENOUS | Status: DC | PRN
  Administered 2019-08-01: 2 mg via INTRAVENOUS

## 2019-08-01 MED ORDER — FENTANYL CITRATE (PF) 100 MCG/2ML IJ SOLN: 25.0000 ug | INTRAMUSCULAR | Status: DC | PRN

## 2019-08-01 MED ORDER — EPINEPHRINE 1 MG/10ML IJ SOSY
PREFILLED_SYRINGE | INTRAMUSCULAR | Status: DC | PRN
  Administered 2019-08-01: 1 via INTRAVENOUS

## 2019-08-01 MED ORDER — POLYETHYLENE GLYCOL 3350 17 G PO PACK
17.0000 g | PACK | Freq: Every day | ORAL | Status: DC
  Administered 2019-08-01: 17 g via ORAL
  Filled 2019-08-01: qty 1

## 2019-08-01 MED ORDER — HEPARIN (PORCINE) IN NACL 1000-0.9 UT/500ML-% IV SOLN
INTRAVENOUS | Status: AC
  Filled 2019-08-01: qty 1500

## 2019-08-01 MED ORDER — ASPIRIN 300 MG RE SUPP
300.0000 mg | RECTAL | Status: AC
  Administered 2019-08-01: 300 mg via RECTAL
  Filled 2019-08-01: qty 1

## 2019-08-01 MED ORDER — EPINEPHRINE 0.1 MG/10ML (10 MCG/ML) SYRINGE FOR IV PUSH (FOR BLOOD PRESSURE SUPPORT)
PREFILLED_SYRINGE | INTRAVENOUS | Status: DC | PRN
  Administered 2019-08-01 (×4): 10 ug via INTRAVENOUS

## 2019-08-01 MED ORDER — LEVETIRACETAM IN NACL 500 MG/100ML IV SOLN
500.0000 mg | Freq: Two times a day (BID) | INTRAVENOUS | Status: DC
  Administered 2019-08-01: 500 mg via INTRAVENOUS
  Filled 2019-08-01: qty 100

## 2019-08-01 MED ORDER — CHLORHEXIDINE GLUCONATE CLOTH 2 % EX PADS
6.0000 | MEDICATED_PAD | Freq: Every day | CUTANEOUS | Status: DC
Start: 2019-08-01 — End: 2019-08-01

## 2019-08-01 MED ORDER — CHLORHEXIDINE GLUCONATE 0.12% ORAL RINSE (MEDLINE KIT)
15.0000 mL | Freq: Two times a day (BID) | OROMUCOSAL | Status: DC
Start: 2019-08-01 — End: 2019-08-01

## 2019-08-01 MED ORDER — ORAL CARE MOUTH RINSE
15.0000 mL | OROMUCOSAL | Status: DC
  Administered 2019-08-01 (×5): 15 mL via OROMUCOSAL

## 2019-08-01 MED ORDER — ASPIRIN 81 MG PO CHEW
CHEWABLE_TABLET | ORAL | Status: AC
  Filled 2019-08-01: qty 4

## 2019-08-01 MED ORDER — LIDOCAINE IN D5W 4-5 MG/ML-% IV SOLN
1.0000 mg/min | INTRAVENOUS | Status: DC
  Filled 2019-08-01: qty 500

## 2019-08-01 MED FILL — Medication: Qty: 1 | Status: AC

## 2019-08-04 ENCOUNTER — Encounter (HOSPITAL_COMMUNITY): Payer: BC Managed Care – PPO

## 2019-08-04 ENCOUNTER — Encounter: Payer: BC Managed Care – PPO | Admitting: Surgery

## 2019-08-28 NOTE — ED Provider Notes (Signed)
[facility] EMERGENCY DEPARTMENT Provider Note   CSN: [id number] Arrival date & time: 08/30/19  0310     History Chief Complaint  Patient presents with  . Code STEMI    Jeremy Sherman is a 69 y.o. male.  Patient presents to the emergency department from home.  EMS initiated code STEMI based on EKG changes.  Patient's wife reportedly heard a thud and found him on the ground.  She could not revive him and called 911.  911 operator instructed her on initiation of CPR.  First responders arrived and placed an AED.  A shock was advised.  Paramedics report that upon their arrival patient was still pulseless and in PEA.  They continued CPR and administered epi at which point they were able to palpate pulses.  Patient transported to the hospital.        Past Medical History:  Diagnosis Date  . AKI (acute kidney injury) ([place]) 04/2019  . CAD (coronary artery disease) 2009   s/p CABG  (LIMA-LAD, SVG-rPDA) - [facility]  . Carotid stenosis, left    L ICA 50%  . Hyperlipidemia   . Hypertension   . Hypertensive urgency 03/11/2019  . 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 Prediabetes   . Stroke [facility])     Patient Active Problem List   Diagnosis Date Noted  . Cardiac arrest with successful resuscitation ([place]) August 30, 2019  . Acute coronary syndrome ([place]) 2019/08/30  . Cardiac arrest ([place]) 08-30-2019  . Acute respiratory failure with hypoxia ([place])   . Acute encephalopathy   . UTI (urinary tract infection) due to urinary indwelling catheter ([place]) 05/08/2019  . Acute metabolic encephalopathy 13/09/6576  . QT prolongation 05/08/2019  . Orthostatic hypotension   . Chronic diastolic congestive heart failure ([place])   . Hypoalbuminemia due to protein-calorie malnutrition ([place])   . Acute blood loss anemia   . Transaminitis   . Diabetes mellitus type 2 in obese ([place])   . Benign prostatic hyperplasia with urinary retention   . Hypothyroidism   . 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 AKI (acute kidney injury) ([place])   . Aneurysm of anterior cerebral artery   . Essential hypertension   . Prediabetes   . Dysphagia, post-stroke   . Thrombus of left atrial appendage   . 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 Carotid stenosis 03/21/2019  . CAD (coronary artery disease) 03/21/2019  . Brain aneurysm 03/21/2019  . Stroke ([place]) 03/20/2019  . Grade II diastolic dysfunction 46/96/2952  . Dizziness 03/11/2019  . Hypertensive urgency 03/11/2019    Past Surgical History:  Procedure Laterality Date  . CORONARY ARTERY BYPASS GRAFT  07/02/2007   [place]-> LAD SVG-> PD branch of RCA [name]) at [place] Left 03/26/2019   Procedure: EXPOSURE OF LEFT COMMON CAROTID ARTERY AND PLACEMENT OF SHEATH;  Surgeon: Marty Heck, MD;  Location: [facility];  Service: Vascular;  Laterality: Left;  . ENDARTERECTOMY Left 03/26/2019   Procedure: REMOVAL OF CAROTID SHEATH AND CLOSURE OF  CAROTID ARTERY;  Surgeon: Serafina Mitchell, MD;  Location: [facility] OR;  Service: Vascular;  Laterality: Left;  . IR ANGIO INTRA EXTRACRAN SEL COM CAROTID INNOMINATE BILAT MOD SED  03/21/2019  . IR ANGIO INTRA EXTRACRAN SEL INTERNAL CAROTID UNI L MOD SED  03/26/2019  . IR ANGIO VERTEBRAL SEL SUBCLAVIAN INNOMINATE BILAT MOD SED  03/21/2019  . IR ANGIOGRAM FOLLOW UP STUDY  03/26/2019  .  IR ANGIOGRAM FOLLOW UP STUDY  03/26/2019  . IR ANGIOGRAM FOLLOW UP STUDY  03/26/2019  . IR ANGIOGRAM FOLLOW UP STUDY  03/26/2019  . IR ANGIOGRAM FOLLOW UP STUDY  03/26/2019  . IR ANGIOGRAM FOLLOW UP STUDY  03/26/2019  . IR CT HEAD LTD  03/26/2019  . IR INTRA CRAN STENT  03/26/2019  . IR INTRAVSC STENT CERV CAROTID W/O EMB-PROT MOD SED INC ANGIO  03/26/2019  . IR NEURO EACH ADD'L AFTER BASIC UNI LEFT (MS)  03/26/2019  . IR TRANSCATH/EMBOLIZ  03/26/2019       Family History  Problem Relation Age of Onset  . Stroke Mother     Social History   Tobacco  Use  . Smoking status: Former Smoker    Packs/day: 0.50    Years: 45.00    Pack years: 22.50    Types: Cigarettes    Start date: 02/27/1974    Quit date: 03/11/2019    Years since quitting: 0.3  . Smokeless tobacco: Never Used  Substance Use Topics  . Alcohol use: Not Currently  . Drug use: Never    Home Medications Prior to Admission medications   Medication Sig Start Date End Date Taking? Authorizing Provider  Amino Acids-Protein Hydrolys (FEEDING SUPPLEMENT, PRO-STAT SUGAR FREE 64,) LIQD Take 30 mLs by mouth 4 (four) times daily. 05/26/19   Raiford Noble Latif, DO  apixaban (ELIQUIS) 5 MG TABS tablet Take 1 tablet (5 mg total) by mouth 2 (two) times daily. 04/23/19   Angiulli, Lavon Paganini, PA-C  atorvastatin (LIPITOR) 80 MG tablet Take 1 tablet (80 mg total) by mouth daily at 6 PM. 04/23/19   Angiulli, Lavon Paganini, PA-C  dexamethasone (DECADRON) 4 MG tablet Take 4 mg by mouth 2 (two) times daily with a meal.    [provider]  diclofenac Sodium (VOLTAREN) 1 % GEL Apply 2 g topically 4 (four) times daily. 04/23/19   Angiulli, Lavon Paganini, PA-C  feeding supplement, ENSURE ENLIVE, (ENSURE ENLIVE) LIQD Take 237 mLs by mouth 2 (two) times daily between meals. 05/26/19   Raiford Noble Latif, DO  lenalidomide (REVLIMID) 15 MG capsule Take 15 mg by mouth daily. [employer] Auth # **     Date Obtained **    [provider]  levothyroxine (SYNTHROID) 100 MCG tablet Take 1 tablet (100 mcg total) by mouth daily at 6 (six) AM. 04/23/19   Angiulli, Lavon Paganini, PA-C  polyethylene glycol (MIRALAX / GLYCOLAX) 17 g packet Take 17 g by mouth daily. Patient taking differently: Take 17 g by mouth daily as needed for mild constipation.  04/24/19   Angiulli, Lavon Paganini, PA-C  senna (SENOKOT) 8.6 MG TABS tablet Take 1 tablet (8.6 mg total) by mouth at bedtime. Patient taking differently: Take 1 tablet by mouth at bedtime as needed for mild constipation.  04/23/19   Angiulli, Lavon Paganini, PA-C  sodium chloride (OCEAN)  0.65 % SOLN nasal spray Place 1 spray into both nostrils as needed for congestion. 05/26/19   Raiford Noble Latif, DO  ticagrelor (BRILINTA) 90 MG TABS tablet Take 1 tablet (90 mg total) by mouth 2 (two) times daily. 04/23/19   Angiulli, Lavon Paganini, PA-C  vitamin B-12 1000 MCG tablet Take 1 tablet (1,000 mcg total) by mouth daily. 05/27/19   Kerney Elbe, DO    Allergies    Patient has no known allergies.  Review of Systems   Review of Systems  Unable to perform ROS: Acuity of condition    Physical Exam  Updated Vital Signs BP (!) 165/57   Pulse (!) 35   Temp (!) 93.3 F (34.1 C) (Rectal)   Resp 16   Ht 6\' 2"  (1.88 m)   SpO2 99%   BMI 29.64 kg/m   Physical Exam Vitals and nursing note reviewed.  Constitutional:      General: He is in acute distress.  HENT:     Head: Atraumatic.  Eyes:     Comments: 32mm, non-reactive  Cardiovascular:     Rate and Rhythm: Tachycardia present. Rhythm irregular.  Pulmonary:     Comments: Bagged via King's airway Abdominal:     Palpations: Abdomen is soft.  Musculoskeletal:        General: No deformity.     Cervical back: Neck supple.  Skin:    General: Skin is dry.  Neurological:     Comments: unresponsive     ED Results / Procedures / Treatments   Labs (all labs ordered are listed, but only abnormal results are displayed) Labs Reviewed  CBC WITH DIFFERENTIAL/PLATELET - Abnormal; Notable for the following components:      Result Value   RBC 2.95 (*)    Hemoglobin 8.7 (*)    HCT 29.0 (*)    RDW 18.4 (*)    nRBC 0.3 (*)    Abs Immature Granulocytes 0.09 (*)    All other components within normal limits  COMPREHENSIVE METABOLIC PANEL - Abnormal; Notable for the following components:   CO2 16 (*)    Glucose, Bld 282 (*)    BUN 36 (*)    Creatinine, Ser 1.47 (*)    Calcium 8.1 (*)    Total Protein 5.4 (*)    Albumin 2.1 (*)    GFR calc non Af Amer 48 (*)    GFR calc Af Amer 55 (*)    Anion gap 17 (*)    All other  components within normal limits  LACTIC ACID, PLASMA - Abnormal; Notable for the following components:   Lactic Acid, Venous 8.9 (*)    All other components within normal limits  PROTIME-INR - Abnormal; Notable for the following components:   Prothrombin Time 19.4 (*)    INR 1.7 (*)    All other components within normal limits  I-STAT ARTERIAL BLOOD GAS, ED - Abnormal; Notable for the following components:   pCO2 arterial 31.1 (*)    pO2, Arterial 476 (*)    Bicarbonate 18.0 (*)    TCO2 19 (*)    Acid-base deficit 7.0 (*)    Potassium 3.2 (*)    HCT 26.0 (*)    Hemoglobin 8.8 (*)    All other components within normal limits  TROPONIN I (HIGH SENSITIVITY) - Abnormal; Notable for the following components:   Troponin I (High Sensitivity) 126 (*)    All other components within normal limits  SARS CORONAVIRUS 2 BY RT PCR (HOSPITAL ORDER, [facility])  LACTIC ACID, PLASMA  BASIC METABOLIC PANEL  BASIC METABOLIC PANEL  PROTIME-INR  APTT  BLOOD GAS, ARTERIAL  BLOOD GAS, ARTERIAL  LACTIC ACID, PLASMA  HEMOGLOBIN A1C  APTT  HEPARIN LEVEL (UNFRACTIONATED)  TROPONIN I (HIGH SENSITIVITY)  TROPONIN I (HIGH SENSITIVITY)    EKG EKG Interpretation  Date/Time:  2019/08/23 03:18:11 EDT Ventricular Rate:  132 PR Interval:    QRS Duration: 119 QT Interval:  370 QTC Calculation: 549 R Axis:   -66 Text Interpretation: Atrial fibrillation LAD, consider left anterior fascicular block Abnormal  T, consider ischemia, lateral leads Confirmed by Orpah Greek [id number]) on Aug 20, 2019 6:18:17 AM   Radiology CT HEAD WO CONTRAST  Result Date: 08/20/19 CLINICAL DATA:  Unresponsive EXAM: CT HEAD WITHOUT CONTRAST TECHNIQUE: Contiguous axial images were obtained from the base of the skull through the vertex without intravenous contrast. COMPARISON:  None. FINDINGS: Brain: No evidence of acute territorial infarction, hemorrhage, hydrocephalus,extra-axial  collection or mass lesion/mass effect. There is dilatation the ventricles and sulci consistent with age-related atrophy. Low-attenuation changes in the deep white matter consistent with small vessel ischemia. Vascular: No hyperdense vessel or unexpected calcification. Again noted is a coiling clips seen adjacent to the corpus callosum. Skull: The skull is intact. No fracture or focal lesion identified. Sinuses/Orbits: Ethmoid air cell mucosal thickening is seen. The orbits and globes intact. Other: None Cervical spine: Alignment: There is straightening of the normal cervical lordosis. Skull base and vertebrae: Visualized skull base is intact. No atlanto-occipital dissociation. The vertebral body heights are well maintained. No fracture or pathologic osseous lesion seen. Soft tissues and spinal canal: The visualized paraspinal soft tissues are unremarkable. No prevertebral soft tissue swelling is seen. The spinal canal is grossly unremarkable, no large epidural collection or significant canal narrowing. Disc levels: Mild disc height loss with disc osteophyte complex and uncovertebral osteophytes most notable at C5-C6. Upper chest: Biapical centrilobular emphysematous changes and bullae are seen. Thoracic inlet is within normal limits. Other: None IMPRESSION: No acute intracranial abnormality. Findings consistent with age related atrophy and chronic small vessel ischemia No acute fracture or malalignment of the spine. Electronically Signed   By: Prudencio Pair M.D.   On: 08/20/2019 03:59   CT CERVICAL SPINE WO CONTRAST  Result Date: 2019/08/20 CLINICAL DATA:  Unresponsive EXAM: CT HEAD WITHOUT CONTRAST TECHNIQUE: Contiguous axial images were obtained from the base of the skull through the vertex without intravenous contrast. COMPARISON:  None. FINDINGS: Brain: No evidence of acute territorial infarction, hemorrhage, hydrocephalus,extra-axial collection or mass lesion/mass effect. There is dilatation the ventricles  and sulci consistent with age-related atrophy. Low-attenuation changes in the deep white matter consistent with small vessel ischemia. Vascular: No hyperdense vessel or unexpected calcification. Again noted is a coiling clips seen adjacent to the corpus callosum. Skull: The skull is intact. No fracture or focal lesion identified. Sinuses/Orbits: Ethmoid air cell mucosal thickening is seen. The orbits and globes intact. Other: None Cervical spine: Alignment: There is straightening of the normal cervical lordosis. Skull base and vertebrae: Visualized skull base is intact. No atlanto-occipital dissociation. The vertebral body heights are well maintained. No fracture or pathologic osseous lesion seen. Soft tissues and spinal canal: The visualized paraspinal soft tissues are unremarkable. No prevertebral soft tissue swelling is seen. The spinal canal is grossly unremarkable, no large epidural collection or significant canal narrowing. Disc levels: Mild disc height loss with disc osteophyte complex and uncovertebral osteophytes most notable at C5-C6. Upper chest: Biapical centrilobular emphysematous changes and bullae are seen. Thoracic inlet is within normal limits. Other: None IMPRESSION: No acute intracranial abnormality. Findings consistent with age related atrophy and chronic small vessel ischemia No acute fracture or malalignment of the spine. Electronically Signed   By: Prudencio Pair M.D.   On: 08/20/2019 03:59   DG Chest Port 1 View  Result Date: 2019-08-20 CLINICAL DATA:  Post intubation EXAM: PORTABLE CHEST 1 VIEW COMPARISON:  May 07, 2019 FINDINGS: There is mild cardiomegaly. ETT is 2.9 cm above the carina. NG tube is seen coursing below the diaphragm. Overlying median sternotomy  wires are present. There is mild central pulmonary vascular congestion. No large airspace consolidation or pleural effusion. No acute osseous abnormality. IMPRESSION: Mild pulmonary vascular congestion. ETT and NG tube in  satisfactory position. Electronically Signed   By: Prudencio Pair M.D.   On: 2019/08/10 03:54    Procedures Procedures (including critical care time)  Medications Ordered in ED Medications  heparin 5000 UNIT/ML injection (has no administration in time range)  aspirin 81 MG chewable tablet (has no administration in time range)  0.9 %  sodium chloride infusion ( Intravenous New Bag/Given 08-10-2019 0515)  lidocaine (cardiac) 2000 mg in dextrose 5% 500 mL (4mg /mL) IV infusion (has no administration in time range)  docusate (COLACE) 50 MG/5ML liquid 100 mg (has no administration in time range)  polyethylene glycol (MIRALAX / GLYCOLAX) packet 17 g (has no administration in time range)  fentaNYL (SUBLIMAZE) injection 25 mcg (has no administration in time range)  fentaNYL (SUBLIMAZE) injection 25-100 mcg (has no administration in time range)  midazolam (VERSED) injection 1 mg (has no administration in time range)  midazolam (VERSED) injection 1 mg (has no administration in time range)  levothyroxine (SYNTHROID, LEVOTHROID) injection 50 mcg (has no administration in time range)  potassium chloride 20 MEQ/15ML (10%) solution 40 mEq (has no administration in time range)  insulin aspart (novoLOG) injection 0-15 Units (has no administration in time range)  heparin ADULT infusion 100 units/mL (25000 units/259mL sodium chloride 0.45%) (has no administration in time range)  EPINEPhrine 10 mcg/mL Adult IV Push Syringe (For Blood Pressure Support) (10 mcg Intravenous Given 2019-08-10 0544)  norepinephrine (LEVOPHED) injection (4 mcg/kg/min  104.7 kg (Order-Specific) Intravenous New Bag/Given 08/10/19 0550)  EPINEPHrine NaCl 4-0.9 MG/250ML-% premix infusion (has no administration in time range)  sodium bicarbonate injection (100 mEq Intravenous Given 2019/08/10 0556)  EPINEPHrine (ADRENALIN) 1 MG/10ML injection (1 Syringe Intravenous Given Aug 10, 2019 0554)  amiodarone (NEXTERONE PREMIX) 360-4.14 MG/200ML-% (1.8 mg/mL) IV  infusion (has no administration in time range)  lidocaine (cardiac) 100 mg/3mL (XYLOCAINE) injection 2% (2 mg Intravenous Given 08-10-19 0601)  sodium bicarbonate 150 mEq in dextrose 5% 1000 mL infusion (has no administration in time range)  etomidate (AMIDATE) injection (20 mg Intravenous Given 10-Aug-2019 0315)  rocuronium (ZEMURON) injection (100 mg Intravenous Given August 10, 2019 0316)  aspirin suppository 300 mg (300 mg Rectal Given 2019-08-10 0507)    ED Course  I have reviewed the triage vital signs and the nursing notes.  Pertinent labs & imaging results that were available during my care of the patient were reviewed by me and considered in my medical decision making (see chart for details).    MDM Rules/Calculators/A&P                      Patient arrived in the emergency department via EMS from home.  Wife reports that she had just checked on the patient because he needed his diaper changed.  She left the room and almost immediately heard a thud.  She went back in her room and found him unresponsive.  She was able to trying to start CPR but it is not clear if she was able to provide adequate CPR.  First responders arrived, continued CPR and shocked the patient 1 time for unknown rhythm (shock provided AED).  Paramedics report PEA initially but were able to regain spontaneous circulation after 1 dose of epi.  Patient had CPR for 10 to 15 minutes.  Patient was brought in as a code STEMI.  Initial EKG  in the field did look suspicious for inferior MI.  EKG at arrival to the ER, however, revealed ST segments back to baseline.  Extensive discussions with cardiology and the patient's wife resulted and recommendation to try and stabilize the patient, no indication for immediate Cath Lab intervention.  This was based on the fact that the patient neurologically was not doing anything at arrival despite no sedation on mechanical intervention.  Initial labs did not look significantly abnormal.  Slight elevation of  creatinine was noted.  He did have a mild troponin elevation as well.  Patient's lactic acid was elevated, as expected from prolonged downtime.  CT head and cervical spine did not show injury from the fall.  Plan was to heparinize and stabilize, reevaluate from a cardiology standpoint after stabilization.  Critical care consulted to admit patient.  After arrangements were made for admission, patient coded here in the emergency department.  CPR was initiated.  Patient noted to be in and out of PEA, V. tach, V. fib and a rhythm that looked suspicious for possible torsades.  Patient administered magnesium.  CPR was continued and was given epi on scheduled doses.  Bicarb administered for acidosis.  Patient given amiodarone bolus and started on amiodarone drip.  Pulse was regained.  I did have another discussion with patient's wife who indicates that she had spoken to the rest of the family and they do not want to repeat CPR going forward.  Agree with maintaining current care level but not escalating if he codes again.  CRITICAL CARE Performed by: Orpah Greek   Total critical care time: 50 minutes  Critical care time was exclusive of separately billable procedures and treating other patients.  Critical care was necessary to treat or prevent imminent or life-threatening deterioration.  Critical care was time spent personally by me on the following activities: development of treatment plan with patient and/or surrogate as well as nursing, discussions with consultants, evaluation of patient's response to treatment, examination of patient, obtaining history from patient or surrogate, ordering and performing treatments and interventions, ordering and review of laboratory studies, ordering and review of radiographic studies, pulse oximetry and re-evaluation of patient's condition.   Final Clinical Impression(s) / ED Diagnoses Final diagnoses:  Cardiac arrest [facility])    Rx / DC Orders ED  Discharge Orders    None       Ozro Russett, Gwenyth Allegra, MD 30-Aug-2019 [phone]

## 2019-08-28 NOTE — Progress Notes (Signed)
PT expired, mechanical ventilation stopped and ET tube was Pulled.

## 2019-08-28 NOTE — Progress Notes (Signed)
RT NOTE:  Pt transported to CT and back to ER without event 

## 2019-08-28 NOTE — ED Notes (Signed)
V-fib shocked at 200 CPR started Epip drip started at 2 mcq/min

## 2019-08-28 NOTE — Code Documentation (Signed)
Ice packs applied

## 2019-08-28 NOTE — ED Notes (Signed)
Patient started to brady went into PEA CPR started.  0533 ROSC short run of v-tach -ST 0536 no pulse CPR

## 2019-08-28 NOTE — Progress Notes (Signed)
EEG Completed; Results Pending  

## 2019-08-28 NOTE — Progress Notes (Signed)
Inpatient Diabetes Program Recommendations  AACE/ADA: New Consensus Statement on Inpatient Glycemic Control (2015)  Target Ranges:  Prepandial:   less than 140 mg/dL      Peak postprandial:   less than 180 mg/dL (1-2 hours)      Critically ill patients:  140 - 180 mg/dL   Lab Results  Component Value Date   GLUCAP 462 (H) 2019/08/07   HGBA1C 5.4 2019/08/07    Review of Glycemic Control  Results for Jeremy Sherman, Jeremy Sherman (MRN [id number]) as of 08-07-19 14:41  Ref. Range 08-07-19 08:01 August 07, 2019 11:03  Glucose-Capillary Latest Ref Range: 70 - 99 mg/dL 295 (H) 462 (H)   Diabetes history:  None  Outpatient Diabetes medications:  None  Current orders for Inpatient glycemic control:  Novolog 0-15 q4H  Inpatient Diabetes Program Recommendations:    ICU glycemic control order set  Will continue to follow while inpatient.  Thank you, Reche Dixon, RN, BSN Diabetes Coordinator Inpatient Diabetes Program [phone] (team pager from 8a-5p)

## 2019-08-28 NOTE — Significant Event (Signed)
PCCM ATTENDING   Pt went into PEA at 0533 + short runs of VTach ROSC at 0536 Lost pulses again at 0536--> [place] (received Defib for V fib) Started on Amiodarone  Vfib shocked and started on an Epi gtt in ED I discussed with wife regarding this recent event She understands her husband's grave clinical condition and the limited interventions available and that we will most likely not be able to reverse the underlying process.  She has spoken to her children and they are in agreement that he is now DNR No CPR No ACLS protocol  No additional vasopressors No escalation of care--> do not up-titrate vasopressors it does not improve prognosis or reverse underlying process Avoid fevers but no aggressive cooling.  Continues on Heparin gtt, Lidocaine per Cardiology  .Marland KitchenSigned Dr Seward Carol Pulmonary Critical Care Locums

## 2019-08-28 NOTE — Progress Notes (Signed)
RT NOTE:  Pt transported to [place] without event. Report given to [place], RT.

## 2019-08-28 NOTE — ED Notes (Signed)
Artic sun pads applied

## 2019-08-28 NOTE — Progress Notes (Signed)
  Amiodarone Drug - Drug Interaction Consult Note  Recommendations: None, monitor for now  Amiodarone is metabolized by the cytochrome P450 system and therefore has the potential to cause many drug interactions. Amiodarone has an average plasma half-life of 50 days (range 20 to 100 days).   There is potential for drug interactions to occur several weeks or months after stopping treatment and the onset of drug interactions may be slow after initiating amiodarone.   []  Statins: Increased risk of myopathy. Simvastatin- restrict dose to 20mg  daily. Other statins: counsel patients to report any muscle pain or weakness immediately.  []  Anticoagulants: Amiodarone can increase anticoagulant effect. Consider warfarin dose reduction. Patients should be monitored closely and the dose of anticoagulant altered accordingly, remembering that amiodarone levels take several weeks to stabilize.  []  Antiepileptics: Amiodarone can increase plasma concentration of phenytoin, the dose should be reduced. Note that small changes in phenytoin dose can result in large changes in levels. Monitor patient and counsel on signs of toxicity.  []  Beta blockers: increased risk of bradycardia, AV block and myocardial depression. Sotalol - avoid concomitant use.  []   Calcium channel blockers (diltiazem and verapamil): increased risk of bradycardia, AV block and myocardial depression.  []   Cyclosporine: Amiodarone increases levels of cyclosporine. Reduced dose of cyclosporine is recommended.  []  Digoxin dose should be halved when amiodarone is started.  []  Diuretics: increased risk of cardiotoxicity if hypokalemia occurs.  []  Oral hypoglycemic agents (glyburide, glipizide, glimepiride): increased risk of hypoglycemia. Patient's glucose levels should be monitored closely when initiating amiodarone therapy.   [x]  Drugs that prolong the QT interval:  Torsades de pointes risk may be increased with concurrent use - avoid if  possible.  Monitor QTc, also keep magnesium/potassium WNL if concurrent therapy can't be avoided. Marland Kitchen Antibiotics: e.g. fluoroquinolones, erythromycin. . Antiarrhythmics: e.g. quinidine, procainamide, disopyramide, sotalol. . Antipsychotics: e.g. phenothiazines, haloperidol.  . Lithium, tricyclic antidepressants, and methadone. Thank You,  Pat Patrick  03-Aug-2019 9:22 AM

## 2019-08-28 NOTE — Progress Notes (Signed)
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  Chaplain engaged in the ministries of prayer and listening with family.

## 2019-08-28 NOTE — ED Notes (Signed)
ROSC

## 2019-08-28 NOTE — ED Notes (Signed)
Ice packs removed, pt at target temp

## 2019-08-28 NOTE — Progress Notes (Addendum)
ANTICOAGULATION CONSULT NOTE - Initial Consult  Pharmacy Consult for Heparin (holding Apixaban) Indication: chest pain/ACS, hx left atrial thrombus, hx of stroke   No Known Allergies  Patient Measurements: Height: 6\' 2"  (188 cm) IBW/kg (Calculated) : 82.2  Vital Signs: Temp: 96.4 F (35.8 C) (06/04 0421) Temp Source: Tympanic (06/04 0421) BP: 114/77 (06/04 0445) Pulse Rate: 99 (06/04 0340)  Labs: Recent Labs    08/25/2019 0325 2019/08/25 0352  HGB 8.7* 8.8*  HCT 29.0* 26.0*  PLT 231  --   LABPROT 19.4*  --   INR 1.7*  --   CREATININE 1.47*  --   TROPONINIHS 126*  --     CrCl cannot be calculated (Unknown ideal weight.).   Medical History: Past Medical History:  Diagnosis Date  . AKI (acute kidney injury) ([place]) 04/2019  . CAD (coronary artery disease) 2009   s/p CABG  (LIMA-LAD, SVG-rPDA) - [facility]  . Carotid stenosis, left    L ICA 50%  . Hyperlipidemia   . Hypertension   . Hypertensive urgency 03/11/2019  . 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 Prediabetes   . Stroke [facility])     Assessment: 69 y/o M with hx of LA thrombus/stroke, here with cardiac arrest s/p ROSC, starting heparin, anticipate using aPTT to dose heparin for now  Goal of Therapy:  Heparin level 0.3-0.5 units/ml  APTT 66-84 secs Monitor platelets by anticoagulation protocol: Yes   Plan:  Start heparin drip at 800 units/hr at 1000 1800 heparin level and aPTT Daily HL/aPTT/CBC Monitor for bleeding  Narda Bonds, PharmD, BCPS Clinical Pharmacist Phone: [phone]

## 2019-08-28 NOTE — H&P (Signed)
NAME:  Jeremy Sherman, MRN:  [id number], DOB:  09-Dec-1950, LOS: 0 ADMISSION DATE:  08/13/2019, CONSULTATION DATE:  08/13/19 REFERRING MD:  Betsey Holiday  CHIEF COMPLAINT:  Cardiac Arrest   Brief History   Jeremy Sherman is a 69 y.o. male who was admitted 6/4 after cardiac arrest with roughly 20 minutes of downtime before ROSC (5 to 8 minutes for fire to arrive, then 13 minutes CPR).  History of present illness   Pt is encephelopathic; therefore, this HPI is obtained from chart review. Jeremy Sherman is a 69 y.o. male who has a PMH including but not limited to recent diagnosis MM (March 2021 and followed by Dr. Benay Spice initially but per notes, he is to follow up with Dr. Bobby Rumpf at [facility]), CAD s/p CABG 2009 and , HTN, HLD, hypothyroidism, pre-DM, stroke (see "past medical history" for rest).  He presented to [facility] ED after cardiac arrest with roughly 20 minutes of downtime before ROSC (5 to 8 minutes for fire to arrive, then 13 minutes CPR).  Around 2am, his wife heard a thump and found pt unresponsive.  EMS was called and upon their arrival, pt received 1 shock and was then PEA.  He received epi and lidocaine in the field and was transported to ED.  EKG post ROSC showed STE inferiorly and STD laterally; however, prior to arrival, STE had resolved and further EKGs in ED were clear of any STE as well.  In ED, he required intubation.  He was evaluated by cardiology who did not feel that he needed urgent cath lab.   PCCM was called for consideration of hypothermia.  Pending neurological recovery, cards will consider cath down the road.  Past Medical History  has Stroke [facility]); Carotid stenosis; CAD (coronary artery disease); Brain aneurysm; Middle cerebral artery stenosis; Aneurysm of anterior cerebral artery; Essential hypertension; Prediabetes; Dysphagia, post-stroke; Thrombus of left atrial appendage; AKI (acute kidney injury) ([place]); Stenosis of right cerebellar  artery; Hypoalbuminemia due to protein-calorie malnutrition ([place]); Acute blood loss anemia; Transaminitis; Diabetes mellitus type 2 in obese ([place]); Benign prostatic hyperplasia with urinary retention; Hypothyroidism; Orthostatic hypotension; Chronic diastolic congestive heart failure ([place]); Dizziness; Grade II diastolic dysfunction; Hypertensive urgency; UTI (urinary tract infection) due to urinary indwelling catheter ([place]); Acute metabolic encephalopathy; QT prolongation; Cardiac arrest with successful resuscitation ([place]); Acute coronary syndrome [facility]); and Cardiac arrest ([place]) on their problem list.  Significant Hospital Events   5/4 > admit.  Consults:  Cardiology.  Procedures:  ETT 6/4 >  CVL pending 6/4 >   Significant Diagnostic Tests:  CT head 6/4 > negative. Echo 6/5 >   Micro Data:  COVID 6/4 >   Antimicrobials:  None.   Interim history/subjective:  Not responsive.  Objective:  Blood pressure (!) 166/91, pulse (!) 35, temperature (!) 93.3 F (34.1 C), temperature source Rectal, resp. rate 18, height 6' 2"  (1.88 m), SpO2 99 %.    Vent Mode: PRVC FiO2 (%):  [60 %-100 %] 60 % Set Rate:  [14 bmp-16 bmp] 14 bmp Vt Set:  [650 mL] 650 mL PEEP:  [5 cmH20] 5 cmH20 Plateau Pressure:  [17 cmH20] 17 cmH20  No intake or output data in the 24 hours ending Aug 13, 2019 0521 There were no vitals filed for this visit.  Examination: General: Adult male, critically ill. Neuro: Unresponsive despite no sedation. HEENT: [place]/AT. Sclerae anicteric.  ETT in place. Cardiovascular: RRR, no M/R/G.  Lungs: Respirations even and unlabored.  CTA bilaterally, No W/R/R.  Abdomen:  BS x 4, soft, NT/ND.  Musculoskeletal: Right tibial IO that is bleeding, no edema.  Skin: Intact, warm, no rashes.  Assessment & Plan:   Cardiac arrest - unclear etiology at this point. Hx CAD s/p CABG, LA thrombus, HTN, HLD. - Start TTM, goal 36 degrees. - Assess echo. - Trend troponin, lactate. - Cardiology  following. - Continue heparin and lidocaine drips per cardiology. - Hold home apixaban, atorvastatin, ticagrelor.  Respiratory insufficiency - in the setting of above.  - Full vent support. - Wean as able. - VAP prevention measures. - Follow CXR.  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 - Sedation:  PRN fentanyl / PRN versed. - Assess EEG. - Neuro consult once rewarmed.  Hypokalemia. - 40 mEq K per tube. - Follow BMP q12 hrs.  Hx pre-DM. - SSI.  Hx hypothyroidism. - Continue home synthroid.  Recent diagnosis of multiple myeloma (March 2021). - F/u as outpatient with Dr. Bobby Rumpf at [facility].  Best Practice:  Diet: NPO. Pain/Anxiety/Delirium protocol (if indicated): Fentanyl PRN / Midazolam PRN.  RASS goal -1. VAP protocol (if indicated): In place. DVT prophylaxis: SCD's / Heparin gtt. GI prophylaxis: PPI. Glucose control: SSI. Mobility: Bedrest. Code Status: Full. Family Communication: Wife updated by Dr. Gilford Raid.  She will discuss things with children this morning. Disposition: ICU.  Labs   CBC: Recent Labs  Lab 08/09/19 0325 08-09-2019 0352  WBC 6.2  --   NEUTROABS 4.4  --   HGB 8.7* 8.8*  HCT 29.0* 26.0*  MCV 98.3  --   PLT 231  --    Basic Metabolic Panel: Recent Labs  Lab Aug 09, 2019 0325 August 09, 2019 0352  NA 140 142  K 3.6 3.2*  CL 107  --   CO2 16*  --   GLUCOSE 282*  --   BUN 36*  --   CREATININE 1.47*  --   CALCIUM 8.1*  --    GFR: CrCl cannot be calculated (Unknown ideal weight.). Recent Labs  Lab 09-Aug-2019 0325  WBC 6.2  LATICACIDVEN 8.9*   Liver Function Tests: Recent Labs  Lab 2019/08/09 0325  AST 34  ALT 32  ALKPHOS 67  BILITOT 0.9  PROT 5.4*  ALBUMIN 2.1*   No results for input(s): LIPASE, AMYLASE in the last 168 hours. No results for input(s): AMMONIA in the last 168 hours. ABG    Component Value Date/Time   PHART 7.361 08-09-2019 0352   PCO2ART 31.1 (L) Aug 09, 2019 0352   PO2ART 476 (H) Aug 09, 2019 0352   HCO3  18.0 (L) Aug 09, 2019 0352   TCO2 19 (L) August 09, 2019 0352   ACIDBASEDEF 7.0 (H) 2019/08/09 0352   O2SAT 100.0 08-09-2019 0352    Coagulation Profile: Recent Labs  Lab 2019/08/09 0325  INR 1.7*   Cardiac Enzymes: No results for input(s): CKTOTAL, CKMB, CKMBINDEX, TROPONINI in the last 168 hours. HbA1C: Hgb A1c MFr Bld  Date/Time Value Ref Range Status  03/21/2019 04:13 AM 6.4 (H) 4.8 - 5.6 % Final    Comment:    (NOTE) Pre diabetes:          5.7%-6.4% Diabetes:              >6.4% Glycemic control for   <7.0% adults with diabetes    CBG: No results for input(s): GLUCAP in the last 168 hours.  Review of Systems:   Unable to obtain as pt is encephalopathic.  Past medical history  He,  has a past medical history of AKI (acute kidney injury) ([place]) (04/2019), CAD (  coronary artery disease) (2009), Carotid stenosis, left, Hyperlipidemia, Hypertension, Hypertensive urgency (03/11/2019), Hypothyroidism, Prediabetes, and Stroke ([place]).   Surgical History    Past Surgical History:  Procedure Laterality Date  . CORONARY ARTERY BYPASS GRAFT  07/02/2007   [place]-> LAD SVG-> PD branch of RCA [name]) at [place] Left 03/26/2019   Procedure: EXPOSURE OF LEFT COMMON CAROTID ARTERY AND PLACEMENT OF SHEATH;  Surgeon: Marty Heck, MD;  Location: [facility];  Service: Vascular;  Laterality: Left;  . ENDARTERECTOMY Left 03/26/2019   Procedure: REMOVAL OF CAROTID SHEATH AND CLOSURE OF  CAROTID ARTERY;  Surgeon: Serafina Mitchell, MD;  Location: [facility] OR;  Service: Vascular;  Laterality: Left;  . IR ANGIO INTRA EXTRACRAN SEL COM CAROTID INNOMINATE BILAT MOD SED  03/21/2019  . IR ANGIO INTRA EXTRACRAN SEL INTERNAL CAROTID UNI L MOD SED  03/26/2019  . IR ANGIO VERTEBRAL SEL SUBCLAVIAN INNOMINATE BILAT MOD SED  03/21/2019  . IR ANGIOGRAM FOLLOW UP STUDY  03/26/2019  . IR ANGIOGRAM FOLLOW UP STUDY  03/26/2019  . IR ANGIOGRAM FOLLOW UP STUDY  03/26/2019  . IR  ANGIOGRAM FOLLOW UP STUDY  03/26/2019  . IR ANGIOGRAM FOLLOW UP STUDY  03/26/2019  . IR ANGIOGRAM FOLLOW UP STUDY  03/26/2019  . IR CT HEAD LTD  03/26/2019  . IR INTRA CRAN STENT  03/26/2019  . IR INTRAVSC STENT CERV CAROTID W/O EMB-PROT MOD SED INC ANGIO  03/26/2019  . IR NEURO EACH ADD'L AFTER BASIC UNI LEFT (MS)  03/26/2019  . IR TRANSCATH/EMBOLIZ  03/26/2019     Social History   reports that he quit smoking about 4 months ago. His smoking use included cigarettes. He started smoking about 45 years ago. He has a 22.50 pack-year smoking history. He has never used smokeless tobacco. He reports previous alcohol use. He reports that he does not use drugs.   Family history   His family history includes Stroke in his mother.   Allergies No Known Allergies   Home meds  Prior to Admission medications   Medication Sig Start Date End Date Taking? Authorizing Provider  Amino Acids-Protein Hydrolys (FEEDING SUPPLEMENT, PRO-STAT SUGAR FREE 64,) LIQD Take 30 mLs by mouth 4 (four) times daily. 05/26/19   Raiford Noble Latif, DO  apixaban (ELIQUIS) 5 MG TABS tablet Take 1 tablet (5 mg total) by mouth 2 (two) times daily. 04/23/19   Angiulli, Lavon Paganini, PA-C  atorvastatin (LIPITOR) 80 MG tablet Take 1 tablet (80 mg total) by mouth daily at 6 PM. 04/23/19   Angiulli, Lavon Paganini, PA-C  dexamethasone (DECADRON) 4 MG tablet Take 4 mg by mouth 2 (two) times daily with a meal.    [provider]  diclofenac Sodium (VOLTAREN) 1 % GEL Apply 2 g topically 4 (four) times daily. 04/23/19   Angiulli, Lavon Paganini, PA-C  feeding supplement, ENSURE ENLIVE, (ENSURE ENLIVE) LIQD Take 237 mLs by mouth 2 (two) times daily between meals. 05/26/19   Raiford Noble Latif, DO  lenalidomide (REVLIMID) 15 MG capsule Take 15 mg by mouth daily. [employer] Auth #     Date Obtained     [provider]  levothyroxine (SYNTHROID) 100 MCG tablet Take 1 tablet (100 mcg total) by mouth daily at 6 (six) AM. 04/23/19   Angiulli, Lavon Paganini, PA-C   polyethylene glycol (MIRALAX / GLYCOLAX) 17 g packet Take 17 g by mouth daily. Patient taking differently: Take 17 g by mouth daily as needed for mild  constipation.  04/24/19   Angiulli, Lavon Paganini, PA-C  senna (SENOKOT) 8.6 MG TABS tablet Take 1 tablet (8.6 mg total) by mouth at bedtime. Patient taking differently: Take 1 tablet by mouth at bedtime as needed for mild constipation.  04/23/19   Angiulli, Lavon Paganini, PA-C  sodium chloride (OCEAN) 0.65 % SOLN nasal spray Place 1 spray into both nostrils as needed for congestion. 05/26/19   Raiford Noble Latif, DO  ticagrelor (BRILINTA) 90 MG TABS tablet Take 1 tablet (90 mg total) by mouth 2 (two) times daily. 04/23/19   Angiulli, Lavon Paganini, PA-C  vitamin B-12 1000 MCG tablet Take 1 tablet (1,000 mcg total) by mouth daily. 05/27/19   Kerney Elbe, DO    Critical care time: 45 min.    Montey Hora, [facility] 2019/08/19, 5:21 AM

## 2019-08-28 NOTE — Progress Notes (Signed)
ANTICOAGULATION CONSULT NOTE - Initial Consult  Pharmacy Consult for Heparin (holding Apixaban) Indication: chest pain/ACS, hx left atrial thrombus, hx of stroke   Allergies  Allergen Reactions  . Protonix [Pantoprazole] Other (See Comments)    Unknown reaction    Patient Measurements: Height: 6\' 2"  (188 cm) IBW/kg (Calculated) : 82.2  Vital Signs: BP: 81/27 (06/04 1900) Pulse Rate: 61 (06/04 1615)  Labs: Recent Labs    09-Aug-2019 0325 Aug 09, 2019 0352 2019-08-09 0723 2019/08/09 1812  HGB 8.7* 8.8*  --   --   HCT 29.0* 26.0*  --   --   PLT 231  --   --   --   APTT  --   --  36 102*  LABPROT 19.4*  --  18.3*  --   INR 1.7*  --  1.6*  --   HEPARINUNFRC  --   --   --  >2.20*  CREATININE 1.47*  --  1.41* 1.90*  TROPONINIHS 126*  --  3,997*  --     CrCl cannot be calculated (Unknown ideal weight.).   Medical History: Past Medical History:  Diagnosis Date  . AKI (acute kidney injury) ([place]) 04/2019  . CAD (coronary artery disease) 2009   s/p CABG  (LIMA-LAD, SVG-rPDA) - [facility]  . Carotid stenosis, left    L ICA 50%  . Hyperlipidemia   . Hypertension   . Hypertensive urgency 03/11/2019  . 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 Prediabetes   . Stroke [facility])     Assessment: 69 y/o M with hx of LA thrombus/stroke, here with cardiac arrest s/p ROSC. On Apixaban PTA, last dose 07/31/19 at 1800. Pharmacy consulted for heparin dosing.   APTT unexpectedly at high end of goal on a relatively low dose (Last available weight was ~140kg) . HL is high due to apixaban. Patient is not on cooling protocol. Unable to confirm how level was drawn. Will decrease rate and recheck.   Goal of Therapy:  Heparin level 0.3 - 0.7 units/ml APTT 66-102 sec    Monitor platelets by anticoagulation protocol: Yes   Plan:  Decrease heparin 750 units/hr  F/u 6hr apTT  F/u aPTT until correlates with heparin level  Daily HL/aPTT/CBC Monitor for bleeding  Benetta Spar, PharmD, BCPS,  BCCP Clinical Pharmacist  Please check AMION for all [address] phone numbers After 10:00 PM, call [place]

## 2019-08-28 NOTE — Progress Notes (Addendum)
Remains critically ill on epinephrine drip. Also on lidocaine and amiodarone.  Myoclonic seizure activity noted intermittently. EEG obtained and pending. Exam -unresponsive, pinpoint pupils, bilateral ventilated breath sounds, S1-S2 regular, soft abdomen with suprapubic masslike lesion palpated,  CT abdomen pelvis 04/2019 reviewed, no mass identified.  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  If neuro prognosis confirmed, they would be amenable to withdrawal of life support  Additional critical care time x 92m  Keyen Marban V. Elsworth Soho MD

## 2019-08-28 NOTE — Consult Note (Addendum)
Interventional Cardiology Consultation:   Patient ID: Taiwo Fish MRN: [id number]; DOB: [birth year]  Admit date: 2019-08-16 Date of Consult: Aug 16, 2019  Primary Care Provider: Imagene Riches, NP [facility] Cardiologist: No primary care provider on file.  None [facility] Electrophysiologist:  None none   Patient Profile:   Jaquane Boughner is a 69 y.o. male with a hx of CAD-CABG in 2009 with recent stroke January 2021 (withdraws all to any plegia and neurologic deficit) who is being seen today for the evaluation of possible inferior ST elevation MI with cardiac arrest at the request of Dr. Betsey Holiday.  In addition to his known coronary disease and recent stroke (status post carotid stent) on Eliquis and Brilinta (for history of left atrial plantar thrombus), he has been diagnosed with multiple myeloma, and has had several episodes of acute on chronic renal failure. . He has an indwelling Foley.  He has a history of hypertension with hypertensive urgency.  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  Code STEMI was called by EMS following ROSC.Marland Kitchen  History of Present Illness:   Mr. Badal was having issues with multiple BMs and urination requiring significant assistance by his wife over the course the evening from June 3 to June 4 overnight.  Wife indicates that she had just changed him for the third or fourth time over the course of the evening and had herself gone to the restroom having left him stated he was feeling fine.  She then heard a thud coming from his room.  When she was able to complete using the restroom she went in to find him down unresponsive.  She did leave the room to get phone to call EMS.  Attempted by standard PCI although admittedly not likely effective.  5 to 8 minutes later the fire department showed up with a ED with a shockable rhythm.  He did receive several shocks from the AED and then via paramedics.   After shocks initially he went into PEA and then with epinephrine had again a shockable rhythm that was able to restore ROSC after 10 minutes of total CPR.  He was intubated in the field with a [place] airway and brought to [facility].  EKG at the setting of immediate postarrest showed pretty significant ST elevations in the inferior leads with depressions in the lateral leads elevations or least 4 to 6 mm.  However prior to arrival to [place] elevations had resolved with exception of mild elevations in aVR.  There were diffuse ST depressions are.  EKGs at [facility] also did not show further ST elevation.  Evaluation the patient was having no purposeful movements and no responsiveness to stimuli.  Airway was exchanged from [facility] airway to ETT without sedation.  After discussion with Dr. Estevan Ryder, he was sent for CT scan of the head to ensure there is no evidence of head bleed.  Plan will be to start heparin pending results.  He was started on lidocaine drip by EMS which was continued for now.  While he was in route to head CT, Dr. Betsey Holiday the on-call fellow myself had a conversation with patient's wife and her sister.  Recommendation 3 of [place] was that given resolution of the inferior ST elevations and his current neurologic status at baseline followed by prolonged downtime that likely prudent course of action would be to contact critical care and have the patient admitted for potential cooling  protocol and neurologic following.  Where he did show signs of meaningful neurologic recovery, we could then consider cardiac catheterization.  Only pertinent review of symptoms is his labile neurologic state with frequent tearfulness.  Almost incontinence, especially with stool softeners given for constipation.  He is almost complete assist requiring help with bathing changing and even walking.  Currently is in a hospital bed in a separate room downstairs.  Past Medical History:  Diagnosis Date  . AKI (acute  kidney injury) ([place]) 04/2019  . CAD (coronary artery disease) 2009   s/p CABG  (LIMA-LAD, SVG-rPDA) - [facility]  . Carotid stenosis, left    L ICA 50%  . Hyperlipidemia   . Hypertension   . Hypertensive urgency 03/11/2019  . 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 Prediabetes   . Stroke ([place])    68 [facility]21withahistory ofHTN(recent hypertensive urgency), HLD,known4x7 mm aneurysm left pericallosal segment left AVA,CAD, MI, CABG (2009),tobacco use,andhypothyroidismwho presented to his PCPs office withdizziness and confusion. He subsequently became diaphoretic, hypotensive andunresponsiveand was taken to [facility] where an MRI revealed astroke.transferred to [facility]. Found to have a 90% L M1 stenosis, large L ACA aneurysm and atrial appendage clot. Underwent L CEA followed by L M1 stent angioplasty and stent assisted coiling L ACA aneurysm. Found to have R hemiparesis following procedure."  Past Surgical History:  Procedure Laterality Date  . CORONARY ARTERY BYPASS GRAFT  07/02/2007   [place]-> LAD SVG-> PD branch of RCA [name]) at [facility] Left 03/26/2019   Procedure: EXPOSURE OF LEFT COMMON CAROTID ARTERY AND PLACEMENT OF SHEATH;  Surgeon: Marty Heck, MD;  Location: [facility];  Service: Vascular;  Laterality: Left;  . ENDARTERECTOMY Left 03/26/2019   Procedure: REMOVAL OF CAROTID SHEATH AND CLOSURE OF  CAROTID ARTERY;  Surgeon: Serafina Mitchell, MD;  Location: [facility] OR;  Service: Vascular;  Laterality: Left;  . IR ANGIO INTRA EXTRACRAN SEL COM CAROTID INNOMINATE BILAT MOD SED  03/21/2019  . IR ANGIO INTRA EXTRACRAN SEL INTERNAL CAROTID UNI L MOD SED  03/26/2019  . IR ANGIO VERTEBRAL SEL SUBCLAVIAN INNOMINATE BILAT MOD SED  03/21/2019  . IR ANGIOGRAM FOLLOW UP STUDY  03/26/2019  . IR ANGIOGRAM FOLLOW UP STUDY  03/26/2019  . IR ANGIOGRAM FOLLOW UP STUDY  03/26/2019  . IR ANGIOGRAM FOLLOW UP STUDY   03/26/2019  . IR ANGIOGRAM FOLLOW UP STUDY  03/26/2019  . IR ANGIOGRAM FOLLOW UP STUDY  03/26/2019  . IR CT HEAD LTD  03/26/2019  . IR INTRA CRAN STENT  03/26/2019  . IR INTRAVSC STENT CERV CAROTID W/O EMB-PROT MOD SED INC ANGIO  03/26/2019  . IR NEURO EACH ADD'L AFTER BASIC UNI LEFT (MS)  03/26/2019  . IR TRANSCATH/EMBOLIZ  03/26/2019     Home Medications:  Prior to Admission medications   Medication Sig Start Date End Date Taking? Authorizing Provider  Amino Acids-Protein Hydrolys (FEEDING SUPPLEMENT, PRO-STAT SUGAR FREE 64,) LIQD Take 30 mLs by mouth 4 (four) times daily. 05/26/19   Raiford Noble Latif, DO  apixaban (ELIQUIS) 5 MG TABS tablet Take 1 tablet (5 mg total) by mouth 2 (two) times daily. 04/23/19   Angiulli, Lavon Paganini, PA-C  atorvastatin (LIPITOR) 80 MG tablet Take 1 tablet (80 mg total) by mouth daily at 6 PM. 04/23/19   Angiulli, Lavon Paganini, PA-C  dexamethasone (DECADRON) 4 MG tablet Take 4 mg by mouth 2 (two) times daily with a meal.  [provider]  diclofenac Sodium (VOLTAREN) 1 % GEL Apply 2 g topically 4 (four) times daily. 04/23/19   Angiulli, Lavon Paganini, PA-C  feeding supplement, ENSURE ENLIVE, (ENSURE ENLIVE) LIQD Take 237 mLs by mouth 2 (two) times daily between meals. 05/26/19   Raiford Noble Latif, DO  lenalidomide (REVLIMID) 15 MG capsule Take 15 mg by mouth daily. [employer] Auth #      Date Obtained     [provider]  levothyroxine (SYNTHROID) 100 MCG tablet Take 1 tablet (100 mcg total) by mouth daily at 6 (six) AM. 04/23/19   Angiulli, Lavon Paganini, PA-C  polyethylene glycol (MIRALAX / GLYCOLAX) 17 g packet Take 17 g by mouth daily. Patient taking differently: Take 17 g by mouth daily as needed for mild constipation.  04/24/19   Angiulli, Lavon Paganini, PA-C  senna (SENOKOT) 8.6 MG TABS tablet Take 1 tablet (8.6 mg total) by mouth at bedtime. Patient taking differently: Take 1 tablet by mouth at bedtime as needed for mild constipation.  04/23/19   Angiulli, Lavon Paganini,  PA-C  sodium chloride (OCEAN) 0.65 % SOLN nasal spray Place 1 spray into both nostrils as needed for congestion. 05/26/19   Raiford Noble Latif, DO  ticagrelor (BRILINTA) 90 MG TABS tablet Take 1 tablet (90 mg total) by mouth 2 (two) times daily. 04/23/19   Angiulli, Lavon Paganini, PA-C  vitamin B-12 1000 MCG tablet Take 1 tablet (1,000 mcg total) by mouth daily. 05/27/19   Kerney Elbe, DO    Inpatient Medications: Scheduled Meds: . aspirin      . heparin       Continuous Infusions:  PRN Meds:   Allergies:   No Known Allergies  Social History:   Social History   Socioeconomic History  . Marital status: Married    Spouse name: Mee Hives  . Number of children: 3  . Years of education: Not on file  . Highest education level: Not on file  Occupational History    Comment: retired  Tobacco Use  . Smoking status: Former Smoker    Packs/day: 0.50    Years: 45.00    Pack years: 22.50    Types: Cigarettes    Start date: 02/27/1974    Quit date: 03/11/2019    Years since quitting: 0.3  . Smokeless tobacco: Never Used  Substance and Sexual Activity  . Alcohol use: Not Currently  . Drug use: Never  . Sexual activity: Not on file  Other Topics Concern  . Not on file  Social History Narrative   06/04/19 lives with wife   Social Determinants of Health   Financial Resource Strain:   . Difficulty of Paying Living Expenses:   Food Insecurity:   . Worried About Charity fundraiser in the Last Year:   . Arboriculturist in the Last Year:   Transportation Needs:   . Film/video editor (Medical):   Marland Kitchen Lack of Transportation (Non-Medical):   Physical Activity:   . Days of Exercise per Week:   . Minutes of Exercise per Session:   Stress:   . Feeling of Stress :   Social Connections:   . Frequency of Communication with Friends and Family:   . Frequency of Social Gatherings with Friends and Family:   . Attends Religious Services:   . Active Member of Clubs or Organizations:   .  Attends Archivist Meetings:   Marland Kitchen Marital Status:   Intimate Partner Violence:   . Fear  of Current or Ex-Partner:   . Emotionally Abused:   Marland Kitchen Physically Abused:   . Sexually Abused:     Family History:    Family History  Problem Relation Age of Onset  . Stroke Mother      ROS:  Please see the history of present illness.  Unable to obtain history from the patient is intubated and sedated.  Patient's wife describes him as being debilitated, is in a separate room with hospital bed following her stroke.  He is hemiplegic.  Has indwelling Foley.  He definitely is not at baseline neurologically fully intact, emotional swings, generalized decreased function.  He is essentially full assistance care with wife's caregiver. Only notable issues was recently trouble with constipation and has now been on stool softeners.  He had several bouts of BM and urination over the course of this evening and morning for which she has had to change him several times.    All other ROS reviewed and negative.     Physical Exam/Data:   Vitals:   2019-08-30 0335 August 30, 2019 0340 08/30/19 0350 08-30-19 0421  BP: 96/72 106/76 (!) 95/59   Pulse: (!) 101 99    Resp: _0 Temp:    (!) 96.4 F (35.8 C)  TempSrc:    Tympanic  SpO2: 100% 100%    Height:       No intake or output data in the 24 hours ending 2019/08/30 0439 Last 3 Weights 06/04/2019 05/25/2019 05/24/2019  Weight (lbs) (No Data) 230 lb 13.2 oz 230 lb 13.2 oz  Weight (kg) (No Data) 104.7 kg 104.7 kg     Body mass index is 29.64 kg/m.  General: Ill-appearing, elderly gentleman.  C-collar in place, no response to painful stimuli. HEENT: Nontraumatic. Neck: C-collar in place Vascular: Faint bilateral carotid bruits.  Decreased a palpable femoral on radial pulses.  Feet are warm to touch, but decreased pulses. Cardiac: Borderline tachycardic irregular regular rhythm with normal S1 and S2.  Soft SEM at RUSB but otherwise no R/G. Lungs:  Coarse/rhonchorous upper airway sounds from intubation. Abd: soft, nontender, no hepatomegaly  Ext: no edema, clubbing or cyanosis. Skin: warm and dry  Neuro: Intubated but not sedated.  No purposeful movement.  EKG:  The EKG was personally reviewed and demonstrates: Initial postarrest/ROSC EKG showed sinus rhythm with roughly 6 million inferior elevations and anterolateral depressions. -EKG from EMS prior to arrival showed resolution of the ST elevations in inferior leads but with persistent diffuse depressions.  Upon arrival to [facility], EKG here showed Atrial fibrillation' LAD, consider left anterior fascicular block; Abnormal T, consider ischemia, lateral leads  Telemetry:  Telemetry was personally reviewed and demonstrates: Sinus rhythm  Relevant CV Studies:  Echocardiogram March 19, 2019: EF 55%.  Mild LVH.  Impaired relaxation.  Severe LA dilation.  Severe RA dilation.  Aortic sclerosis with no stenosis.  Normal PA pressures.  Laboratory Data:  High Sensitivity Troponin:   Recent Labs  Lab 08-30-2019 0325  TROPONINIHS 126*     Chemistry Recent Labs  Lab 08/30/19 0325 Aug 30, 2019 0352  NA 140 142  K 3.6 3.2*  CL 107  --   CO2 16*  --   GLUCOSE 282*  --   BUN 36*  --   CREATININE 1.47*  --   CALCIUM 8.1*  --   GFRNONAA 48*  --   GFRAA 55*  --   ANIONGAP 17*  --     Recent Labs  Lab 08/30/2019 0325  PROT 5.4*  ALBUMIN 2.1*  AST 34  ALT 32  ALKPHOS 67  BILITOT 0.9   Hematology Recent Labs  Lab 08-11-19 0325 08-11-2019 0352  WBC 6.2  --   RBC 2.95*  --   HGB 8.7* 8.8*  HCT 29.0* 26.0*  MCV 98.3  --   MCH 29.5  --   MCHC 30.0  --   RDW 18.4*  --   PLT 231  --    BNPNo results for input(s): BNP, PROBNP in the last 168 hours.  DDimer No results for input(s): DDIMER in the last 168 hours.   Radiology/Studies:  CT HEAD WO CONTRAST  Result Date: August 11, 2019 CLINICAL DATA:  Unresponsive EXAM: CT HEAD WITHOUT CONTRAST TECHNIQUE: Contiguous axial images  were obtained from the base of the skull through the vertex without intravenous contrast. COMPARISON:  None. FINDINGS: Brain: No evidence of acute territorial infarction, hemorrhage, hydrocephalus,extra-axial collection or mass lesion/mass effect. There is dilatation the ventricles and sulci consistent with age-related atrophy. Low-attenuation changes in the deep white matter consistent with small vessel ischemia. Vascular: No hyperdense vessel or unexpected calcification. Again noted is a coiling clips seen adjacent to the corpus callosum. Skull: The skull is intact. No fracture or focal lesion identified. Sinuses/Orbits: Ethmoid air cell mucosal thickening is seen. The orbits and globes intact. Other: None Cervical spine: Alignment: There is straightening of the normal cervical lordosis. Skull base and vertebrae: Visualized skull base is intact. No atlanto-occipital dissociation. The vertebral body heights are well maintained. No fracture or pathologic osseous lesion seen. Soft tissues and spinal canal: The visualized paraspinal soft tissues are unremarkable. No prevertebral soft tissue swelling is seen. The spinal canal is grossly unremarkable, no large epidural collection or significant canal narrowing. Disc levels: Mild disc height loss with disc osteophyte complex and uncovertebral osteophytes most notable at C5-C6. Upper chest: Biapical centrilobular emphysematous changes and bullae are seen. Thoracic inlet is within normal limits. Other: None IMPRESSION: No acute intracranial abnormality. Findings consistent with age related atrophy and chronic small vessel ischemia No acute fracture or malalignment of the spine. Electronically Signed   By: Prudencio Pair M.D.   On: Aug 11, 2019 03:59   CT CERVICAL SPINE WO CONTRAST  Result Date: Aug 11, 2019 CLINICAL DATA:  Unresponsive EXAM: CT HEAD WITHOUT CONTRAST TECHNIQUE: Contiguous axial images were obtained from the base of the skull through the vertex without  intravenous contrast. COMPARISON:  None. FINDINGS: Brain: No evidence of acute territorial infarction, hemorrhage, hydrocephalus,extra-axial collection or mass lesion/mass effect. There is dilatation the ventricles and sulci consistent with age-related atrophy. Low-attenuation changes in the deep white matter consistent with small vessel ischemia. Vascular: No hyperdense vessel or unexpected calcification. Again noted is a coiling clips seen adjacent to the corpus callosum. Skull: The skull is intact. No fracture or focal lesion identified. Sinuses/Orbits: Ethmoid air cell mucosal thickening is seen. The orbits and globes intact. Other: None Cervical spine: Alignment: There is straightening of the normal cervical lordosis. Skull base and vertebrae: Visualized skull base is intact. No atlanto-occipital dissociation. The vertebral body heights are well maintained. No fracture or pathologic osseous lesion seen. Soft tissues and spinal canal: The visualized paraspinal soft tissues are unremarkable. No prevertebral soft tissue swelling is seen. The spinal canal is grossly unremarkable, no large epidural collection or significant canal narrowing. Disc levels: Mild disc height loss with disc osteophyte complex and uncovertebral osteophytes most notable at C5-C6. Upper chest: Biapical centrilobular emphysematous changes and bullae are seen. Thoracic inlet is within normal limits.  Other: None IMPRESSION: No acute intracranial abnormality. Findings consistent with age related atrophy and chronic small vessel ischemia No acute fracture or malalignment of the spine. Electronically Signed   By: Prudencio Pair M.D.   On: Aug 16, 2019 03:59   DG Chest Port 1 View  Result Date: Aug 16, 2019 CLINICAL DATA:  Post intubation EXAM: PORTABLE CHEST 1 VIEW COMPARISON:  May 07, 2019 FINDINGS: There is mild cardiomegaly. ETT is 2.9 cm above the carina. NG tube is seen coursing below the diaphragm. Overlying median sternotomy wires are  present. There is mild central pulmonary vascular congestion. No large airspace consolidation or pleural effusion. No acute osseous abnormality. IMPRESSION: Mild pulmonary vascular congestion. ETT and NG tube in satisfactory position. Electronically Signed   By: Prudencio Pair M.D.   On: 08/16/19 03:54       TIMI Risk Score for Unstable Angina or Non-ST Elevation MI:   The patient's TIMI risk score is 6, which indicates a 41% risk of all cause mortality, new or recurrent myocardial infarction or need for urgent revascularization in the next 14 days.    Assessment and Plan:   Principal Problem:   Cardiac arrest with successful resuscitation [facility]) Active Problems:   Acute coronary syndrome (HCC)   CAD (coronary artery disease)   Middle cerebral artery stenosis   Essential hypertension   Grade II diastolic dysfunction  Mr. Kimball is a very unfortunate 69 year old with a very complicated history noted above who now presents following cardiac arrest.  He was found unresponsive after falling out of the bed shortly after being seen by his wife in his usual albeit unhealthy state of health.  After roughly 5 to 8 minutes of attempted onlooker CPR, fire department arrived with a ED had a shockable rhythm.  Despite this he ended 1 back into PEA arrest with CPR and epinephrine in the had another shockable rhythm which eventually led to ROSC.  Not unexpectedly in a patient with known coronary disease is post arrest EKG did show signs of inferior ST elevation, however subsequent EKGs did not show evidence of ST elevation.  At this point he has been maintained on lidocaine drip.  He has just had a head CT performed, which provided does not show signs of bleed we will initiate IV heparin.  As noted above myself and the on-call cardiology fellow and Dr. Betsey Holiday I had a long conversation with the patient's wife.  Consensus decision was that in the absence of active ST elevation and no significant neurologic  response to stimuli, that the best course of action will be to admit and stabilize to follow for signs of neurologic recovery.  If there is evidence of not recovery and stable renal function (given recent acute on chronic renal failure just a few months ago), we could consider invasive evaluation catheterization as I suspected likely culprit for this ischemia is the SVG to the PDA.  At this point with relatively prolonged downtime in a patient with existing stroke and cerebrovascular disease, his likelihood of meaningful neurologic recovery to the point you do a stage where he was prior to this event is very unlikely.  Based on this the family agreed that heroic attempts at cardiac apposition tonight would not be warranted.  Cardiology will follow along.   Recommendations: Agree with IV heparin and lidocaine for at least the next 10 to 12 hours pending rounding by 5 cardiology service.  Could potentially switch to amiodarone given his history of A. Fib (is on Eliquis for LAA  thrombus and has A. fib on current EKG).  Follow signs of neurologic recovery and consider invasive evaluation if there is evidence of neurologic recovery and no significant renal failure.     For questions or updates, please contact [place] Please consult [email or web address] for contact info under    Signed, Glenetta Hew, MD  08-19-19 4:39 AM

## 2019-08-28 NOTE — Progress Notes (Signed)
Patient arrived on 2H unresponsive. Dr. Gilford Raid spoke to family about patient prognosis (see her notes). Patient is DNR at this time with no escalation of care. Wife and sister came to see patient at bedside.

## 2019-08-28 NOTE — ED Provider Notes (Signed)
  [facility] EMERGENCY DEPARTMENT Provider Note      Procedures Procedure Name: Intubation Date/Time: 08/29/2019 3:30 AM Performed by: Montine Circle, PA-C Pre-anesthesia Checklist: Patient identified, Patient being monitored, Emergency Drugs available, Timeout performed and Suction available Oxygen Delivery Method: Ambu bag Preoxygenation: Pre-oxygenation with 100% oxygen Induction Type: Rapid sequence Ventilation: Mask ventilation without difficulty Laryngoscope Size: Glidescope and 4 Tube size: 7.5 mm Number of attempts: 1 Airway Equipment and Method: Video-laryngoscopy Placement Confirmation: ETT inserted through vocal cords under direct vision,  CO2 detector,  Breath sounds checked- equal and bilateral and Positive ETCO2 Secured at: 23 cm Tube secured with: ETT holder Dental Injury: Teeth and Oropharynx as per pre-operative assessment        Dr. Betsey Holiday was present at the bedside for the entire procedure.   Montine Circle, PA-C 08/29/2019 [id number]    Orpah Greek, MD 2019-08-29 [phone]

## 2019-08-28 NOTE — ED Notes (Signed)
Amiodarone drip @60  mg /hr. Left EJ

## 2019-08-28 NOTE — Progress Notes (Signed)
Nutrition Brief Note  Chart reviewed. Per MD notes pt with poor prognosis. Plan is for no escalation of care; awaiting for family members from out of town to arrive.  Per PCCM notes, if neurological status is confirmed, family agreeable to withdrawal of care. 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 No further nutrition interventions warranted at this time.  Please re-consult as needed.   Loistine Chance, RD, LDN, [place] Registered Dietitian II Certified Diabetes Care and Education Specialist Please refer to [facility] for RD and/or RD on-call/weekend/after hours pager

## 2019-08-28 NOTE — ED Triage Notes (Signed)
Pt comes via [facility] from home, at 2am wife heard a thumb called out for unresponsive, pt pulseless and apenic, CPR started at 0213, ROSC at 0226. Received shock X1 by fire then PEA, received one epi PTA, 100 mg push of Lido and 2mg  drip of lido PTA

## 2019-08-28 NOTE — Progress Notes (Signed)
  Echocardiogram 2D Echocardiogram has been performed.  Jeremy Sherman 2019-08-14, 2:35 PM

## 2019-08-28 NOTE — Procedures (Signed)
Patient Name: Jeremy Sherman  MRN: [id number]  Epilepsy Attending: Lora Havens  Referring Physician/Provider: Montey Hora, PA Date: 08-16-2019 Duration: 23.29 minutes  Patient history: 69 year old male status post cardiac arrest noted to have myoclonic seizure-like activity. EEG 12 for seizures.  Level of alertness: Comatose  AEDs during EEG study: Keppra  Technical aspects: This EEG study was done with scalp electrodes positioned according to the 10-20 International system of electrode placement. Electrical activity was acquired at a sampling rate of 500Hz  and reviewed with a high frequency filter of 70Hz  and a low frequency filter of 1Hz . EEG data were recorded continuously and digitally stored.   Description: EEG showed burst suppression pattern with highly epileptiform bursts,.'s of suppression lasting about 15 to 20 seconds with bursts of generalized polyspikes lasting 2 to 8 seconds. Patient was noted to have intermittent head twitching as well as left lower extremity twitching consistent with myoclonic seizures. EEG was not reactive to noxious stimulation. Hyperventilation and photic stimulation were not performed.     ABNORMALITY - Myoclonic seizure, generalized - Burst suppression with highly epileptiform discharges, generalized  IMPRESSION: This study showed multiple myoclonic seizures as well as evidence of profound diffuse encephalopathy suggestive of diffuse anoxic/hypoxic brain injury.      Merdith Adan Barbra Sarks

## 2019-08-28 NOTE — ED Notes (Signed)
Mag.2gm Bolus given 0538 v-fib patient shocked at 120 cpr started. Ns saline bolus given. 0541 pulse check no pulse v-fib shocked at 150 . 0544 V-fib shocked at 200 0546 Amiodarone 300 mg IV push 0548 ROSC

## 2019-08-28 DEATH — deceased

## 2019-09-17 ENCOUNTER — Ambulatory Visit: Payer: Medicare Other | Admitting: Sports Medicine

## 2019-09-28 NOTE — Discharge Summary (Signed)
   NAME:  Jeremy Sherman, MRN:  [id number], DOB:  08-03-1950, LOS: 1 ADMISSION DATE:  05-Aug-2019, CONSULTATION DATE:  2019/08/05 REFERRING MD:  Jeremy Sherman  CHIEF COMPLAINT:  Cardiac Arrest    History of present illness   Jeremy Sherman is a 69 y.o. male who has a PMH including but not limited to recent diagnosis MM (March 2021 and followed by Dr. Benay Sherman initially but per notes, he is to follow up with Dr. Bobby Sherman at [facility]), CAD s/p CABG 2009 and , HTN, HLD, hypothyroidism, pre-DM, stroke (see "past medical history" for rest).  He presented to [facility] ED after cardiac arrest with roughly 20 minutes of downtime before ROSC (5 to 8 minutes for fire to arrive, then 13 minutes CPR).  Around 2am, his wife heard a thump and found pt unresponsive.  EMS was called and upon their arrival, pt received 1 shock and was then PEA.  He received epi and lidocaine in the field and was transported to ED.  EKG post ROSC showed STE inferiorly and STD laterally; however, prior to arrival, STE had resolved and further EKGs in ED were clear of any STE as well.  In ED, he required intubation.  He was evaluated by cardiology who did not feel that he needed urgent cath lab.   PCCM was called for consideration of hypothermia.  Pending neurological recovery, cards will consider cath down the road.  Past Medical History  has Stroke [facility]); Carotid stenosis; CAD (coronary artery disease); Brain aneurysm; Middle cerebral artery stenosis; Aneurysm of anterior cerebral artery; Essential hypertension; Prediabetes; Dysphagia, post-stroke; Thrombus of left atrial appendage; AKI (acute kidney injury) ([place]); Stenosis of right cerebellar artery; Hypoalbuminemia due to protein-calorie malnutrition ([place]); Acute blood loss anemia; Transaminitis; Diabetes mellitus type 2 in obese ([place]); Benign prostatic hyperplasia with urinary retention; Hypothyroidism; Orthostatic hypotension; Chronic diastolic congestive heart failure  ([place]); Dizziness; Grade II diastolic dysfunction; Hypertensive urgency; UTI (urinary tract infection) due to urinary indwelling catheter ([place]); Acute metabolic encephalopathy; QT prolongation; Cardiac arrest with successful resuscitation ([place]); Acute coronary syndrome ([place]); Cardiac arrest ([place]); Acute respiratory failure with hypoxia ([place]); and Acute encephalopathy on their problem list.  Significant Hospital Events   5/4 > admit.  Consults:  Cardiology.  Procedures:  ETT August 05, 2022 >  CVL pending 05-Aug-2022 >   Significant Diagnostic Tests:  CT head 6/4 > negative. Echo 6/5 >   Micro Data:  COVID 05-Aug-2022 > neg   COURSE -he developed 2 more PEA arrest Pt went into PEA at 0533 + short runs of VTach ROSC at 0536 Lost pulses again at 0536--> ROSC 0548 (received Defib for V fib) Started on Amiodarone  Vfib shocked and started on an Epi gtt in ED  He was maintained on epinephrine, lidocaine and amiodarone drips.  Myoclonic seizure activity was noted. After discussing with family DNR was issued, no escalation of care. He passed away on the ventilator on 08-05-22 at 11:33 AM.  Cause of death -acute coronary syndrome, ventricular fibrillation, cardiogenic shock , comorbidities including multiple myeloma and CAD  Jeremy Sherman V. Jeremy Soho MD

## 2020-08-26 IMAGING — DX DG CHEST 1V PORT
1 series · 1 of 1 positions shown · non-contrast
Comparison: Chest radiographs 04/02/2019 and earlier.

CLINICAL DATA: 68-year-old male with pain.

EXAM:
PORTABLE CHEST 1 VIEW

[chest ap]
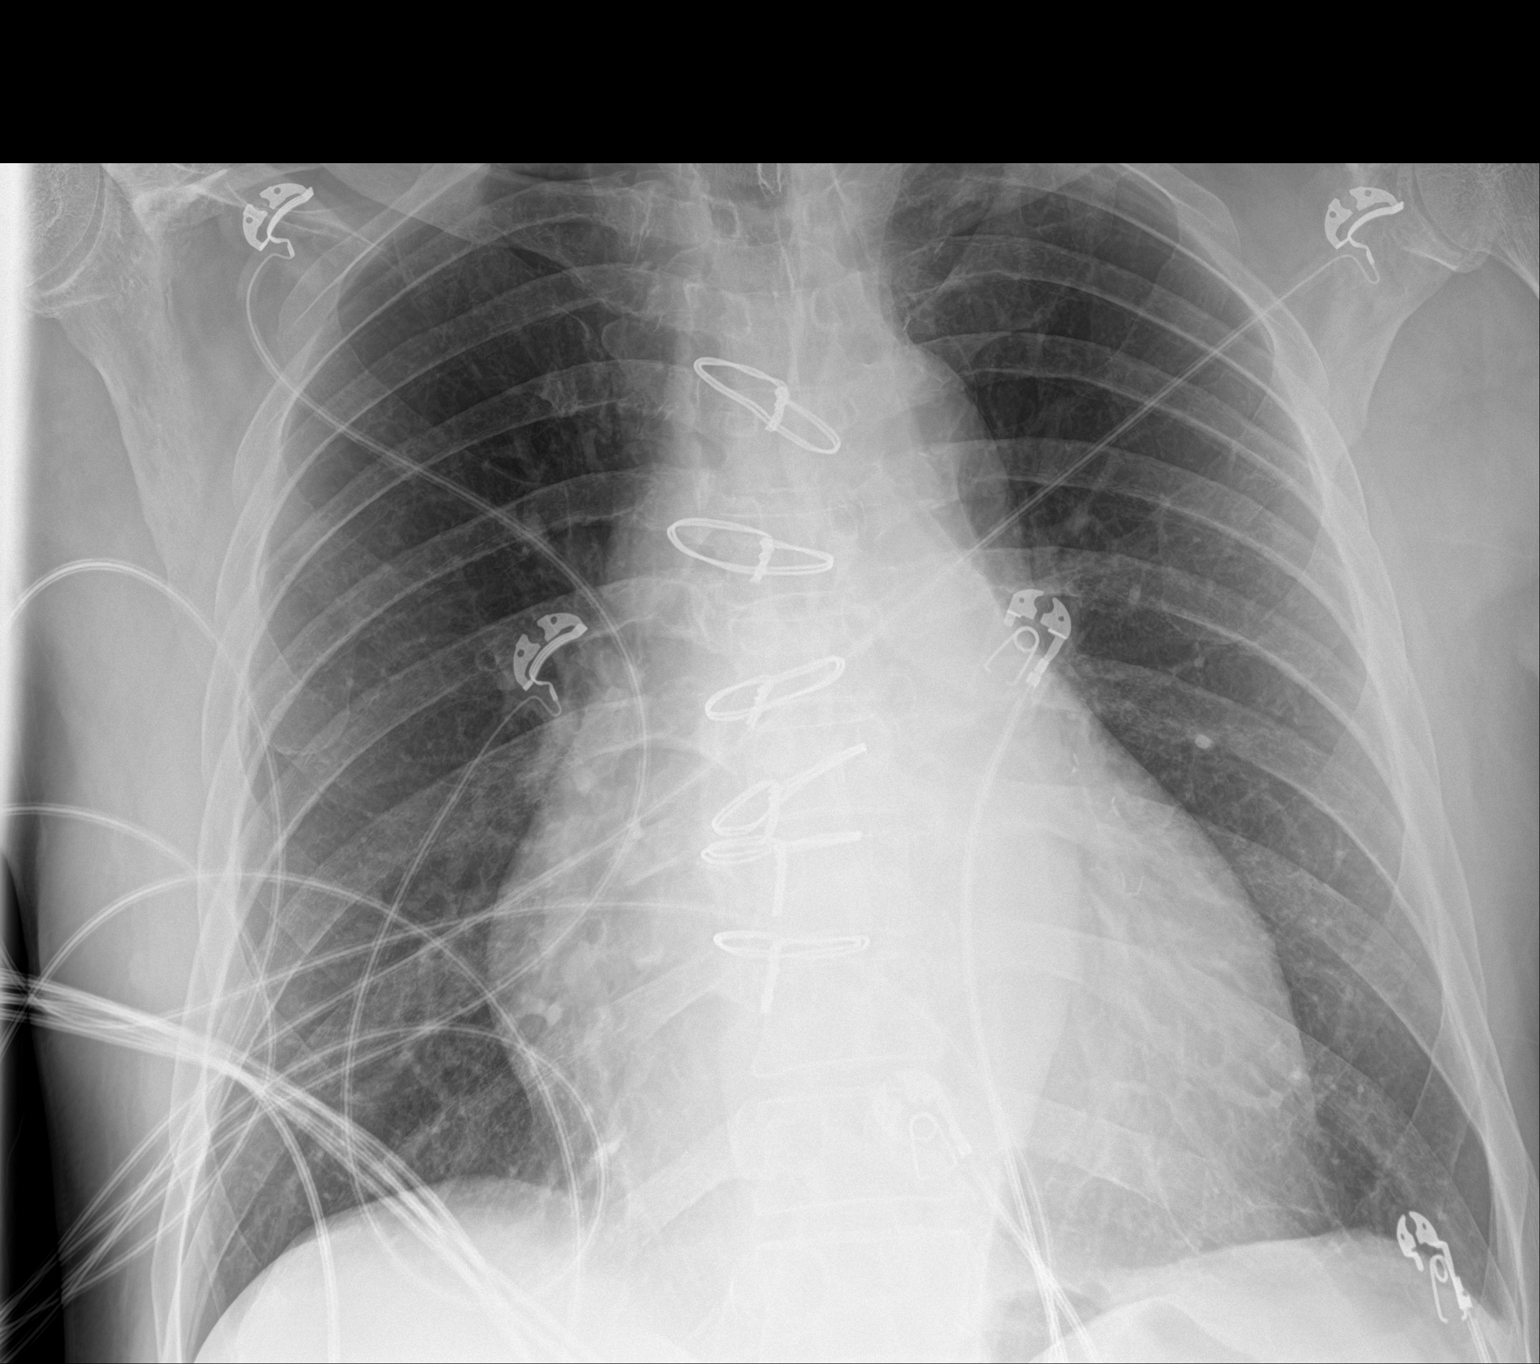

[1 of 1 positions shown; findings below may reference images not displayed]

FINDINGS: Portable AP semi upright view at 4366 hours. Right PICC line has
been removed since last month. Stable cardiomegaly and mediastinal
contours. Lung volumes are at the upper limits of normal. Allowing
for portable technique the lungs are clear. Visualized tracheal air
column is within normal limits. No pneumothorax. Prior sternotomy.
IMPRESSION: Stable cardiomegaly. No acute cardiopulmonary abnormality.

## 2020-09-01 IMAGING — CR DG ABDOMEN 1V
1 series · 1 of 1 positions shown · non-contrast
Comparison: March 23, 2019.

CLINICAL DATA: Abdominal pain.

EXAM:
ABDOMEN - 1 VIEW

[abdomen kub]
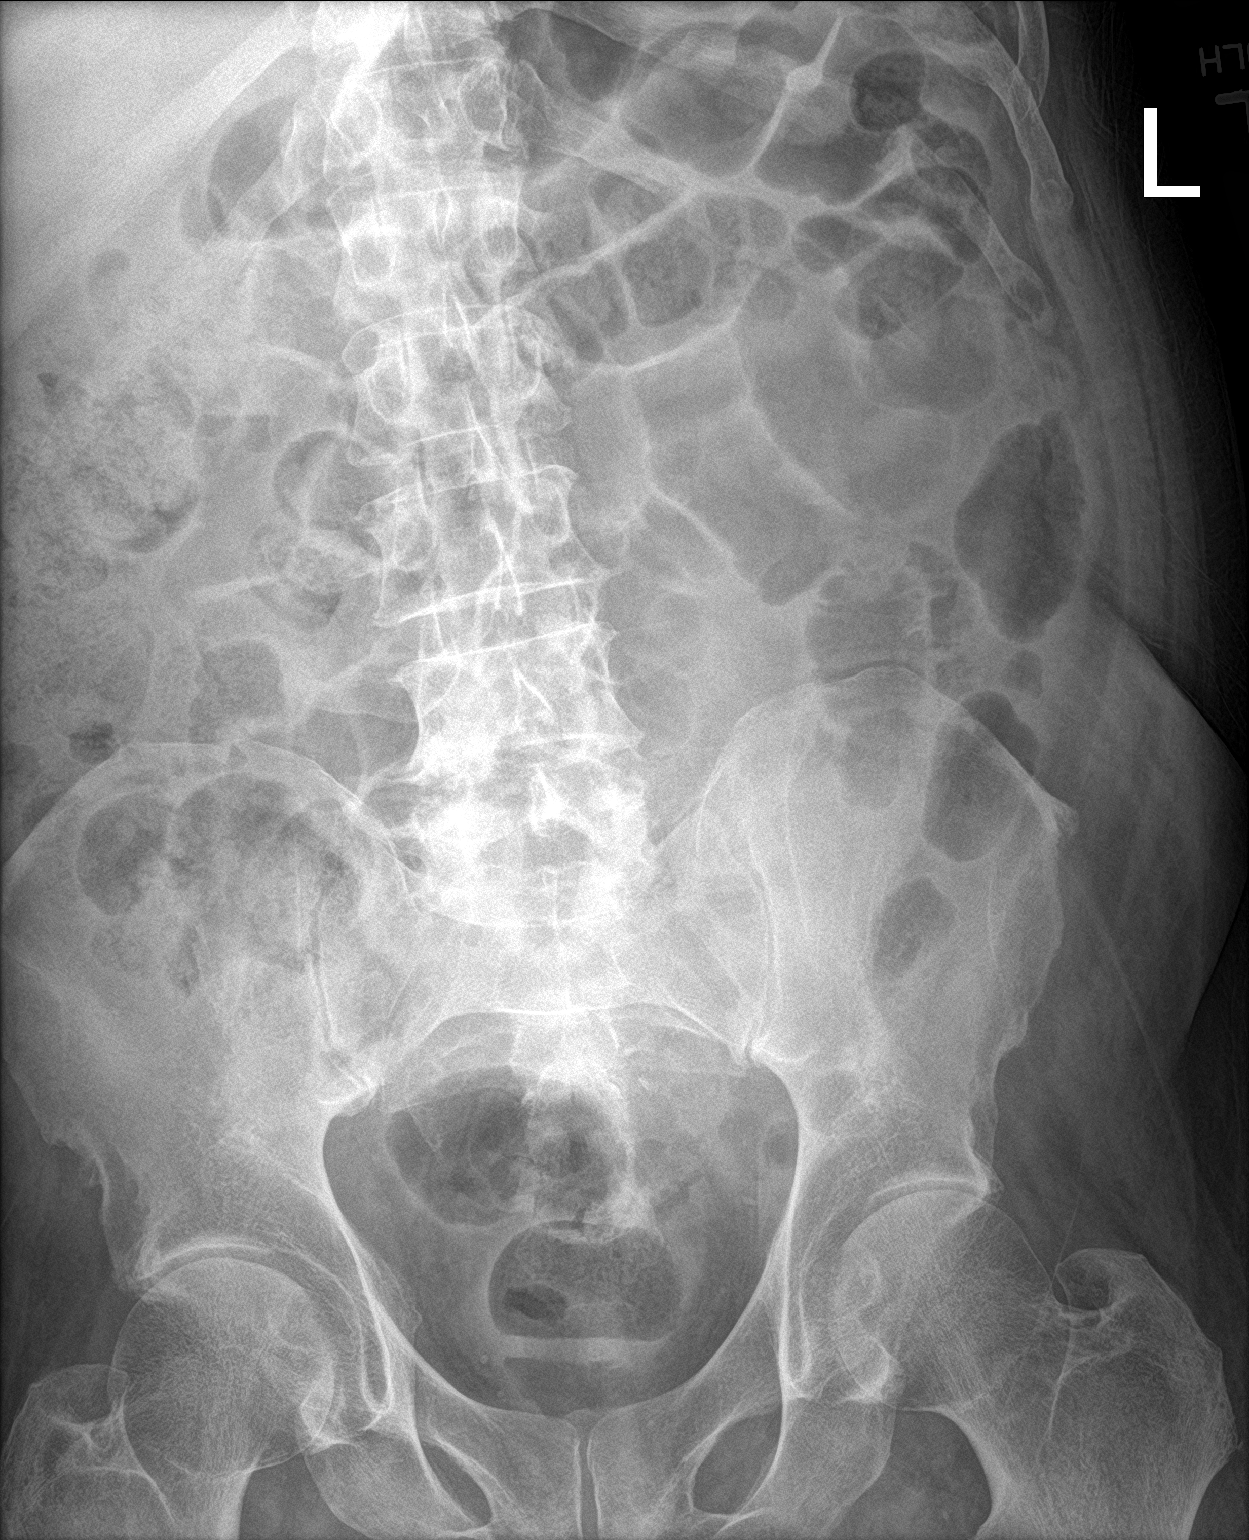

[1 of 1 positions shown; findings below may reference images not displayed]

FINDINGS: The bowel gas pattern is normal. No radio-opaque calculi or other
significant radiographic abnormality are seen.
IMPRESSION: Negative.

## 2020-09-10 IMAGING — CT CT BIOPSY AND ASPIRATION BONE MARROW
1 of 2 series · 15 of 32 positions shown, 19 images · non-contrast
Comparison: none

CLINICAL DATA: Elevated M spike and light chains

EXAM:
CT GUIDED DEEP ILIAC BONE ASPIRATION AND CORE BIOPSY
TECHNIQUE: Patient was placed prone on the CT gantry and limited axial scans
through the pelvis were obtained. Appropriate skin entry site was
identified. Skin site was marked, prepped with chlorhexidine, draped
in usual sterile fashion, and infiltrated locally with 1% lidocaine.

[Series 2: i-spiral 5.0 b40f · axial · 0.88mm/px · z∈[+1190,+1306]mm · 15 of 38 slices shown, 19 images]
[im 3/38  soft-tissue]
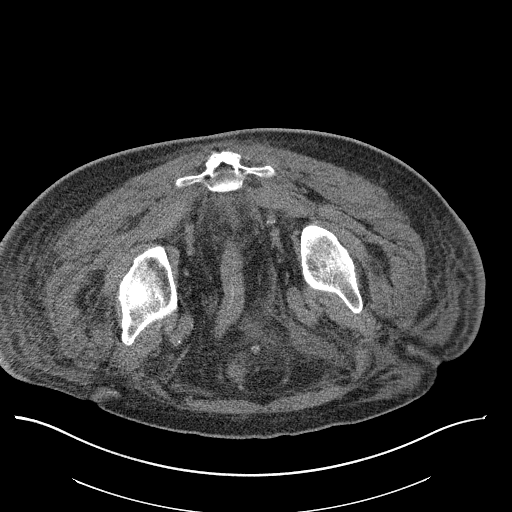
[im 3/38  bone]
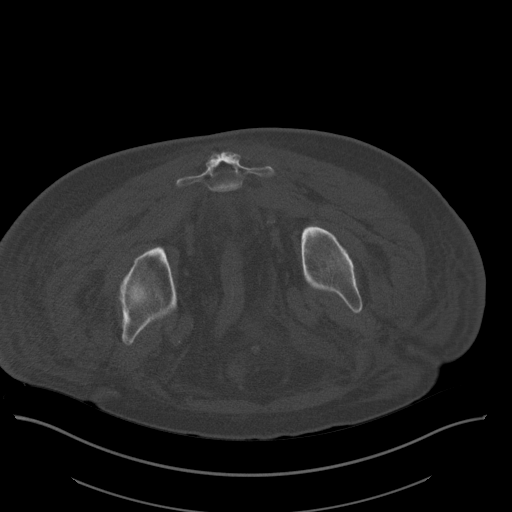
[im 6/38  soft-tissue]
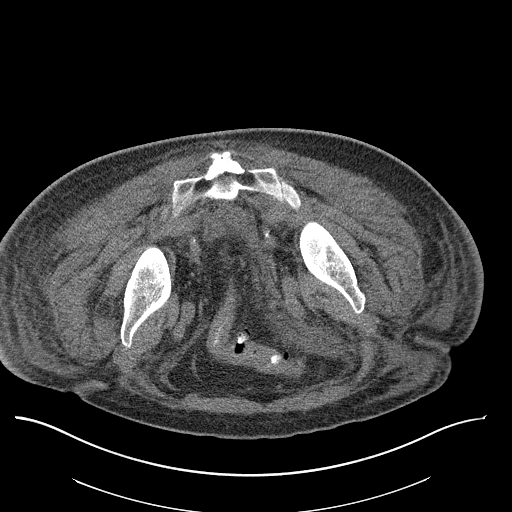
[im 9/38  soft-tissue]
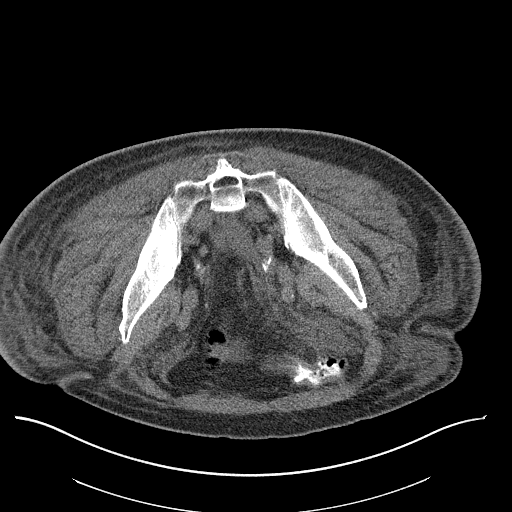
[im 11/38  soft-tissue]
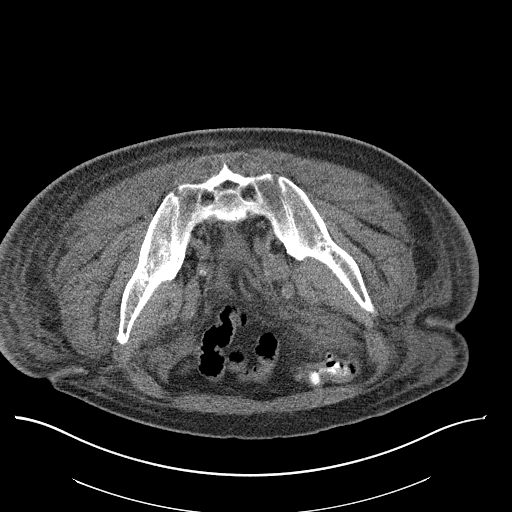
[im 14/38  soft-tissue]
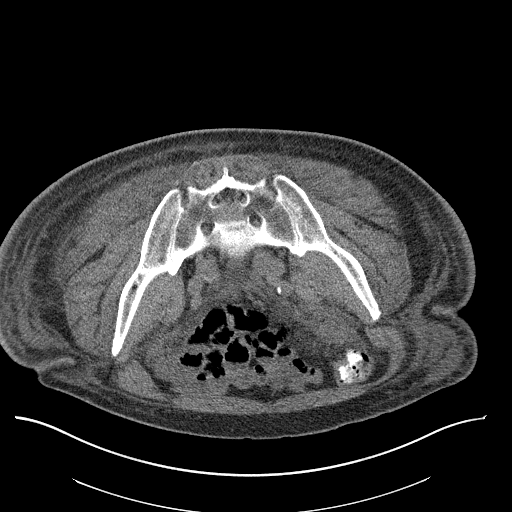
[im 17/38  soft-tissue]
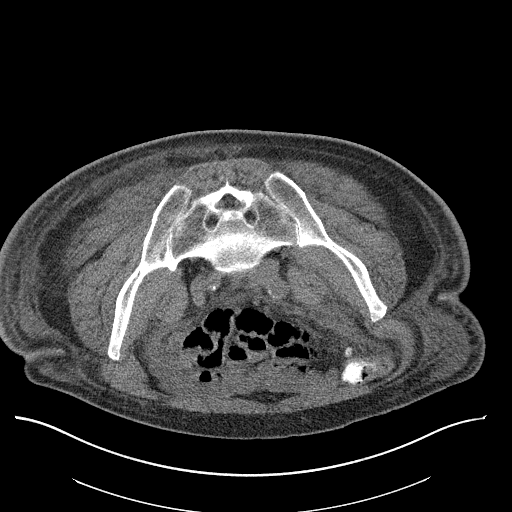
[im 20/38  soft-tissue]
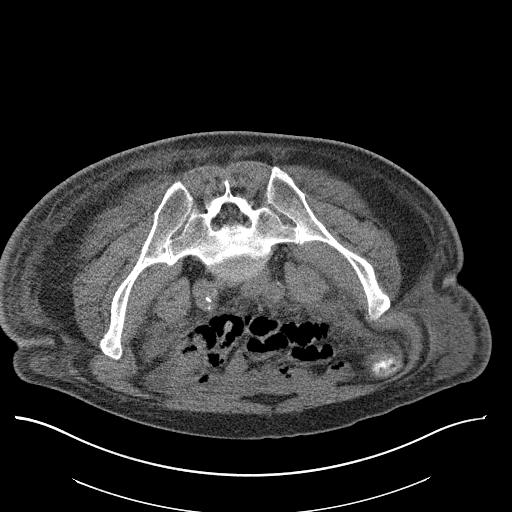
[im 22/38  soft-tissue]
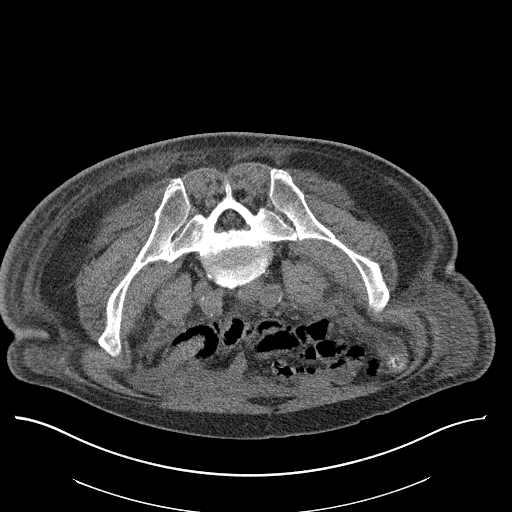
[im 25/38  soft-tissue]
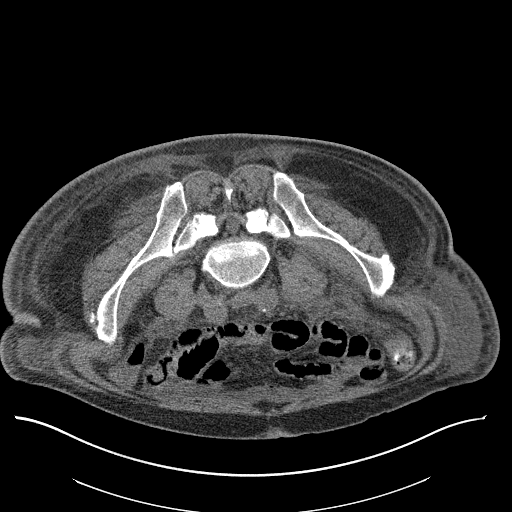
[im 25/38  bone]
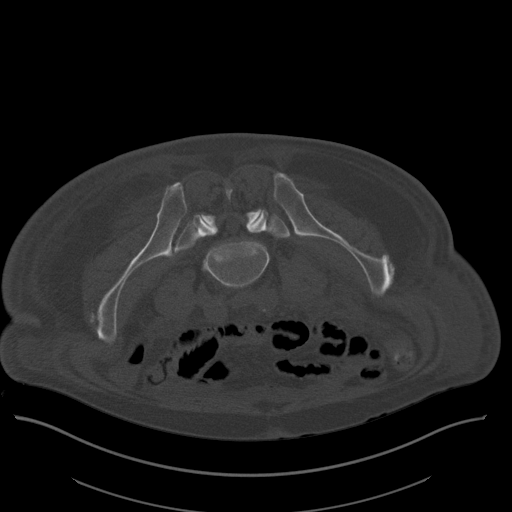
[im 28/38  soft-tissue]
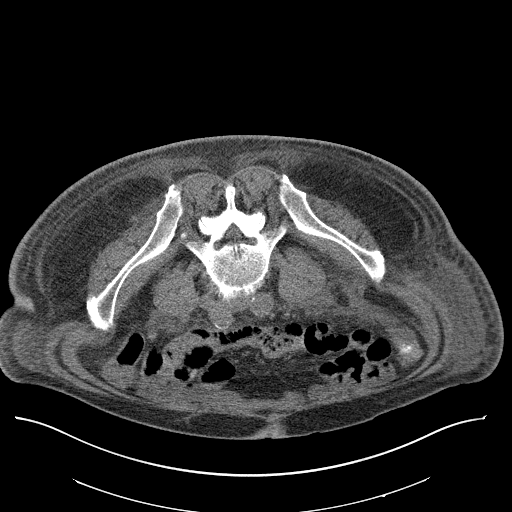
[im 31/38  soft-tissue]
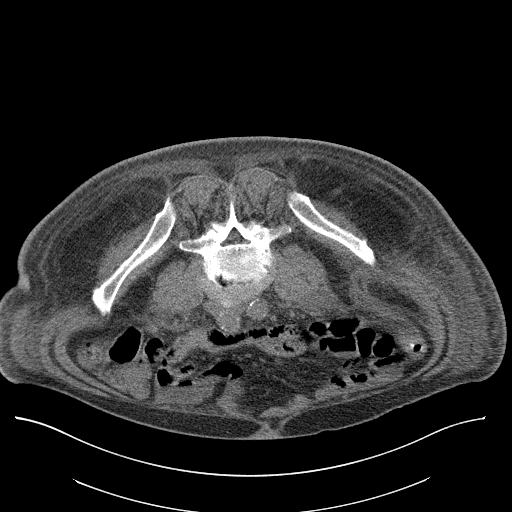
[im 32/38  lung]
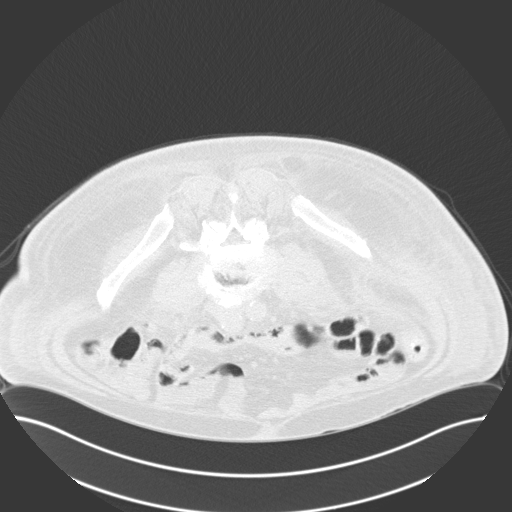
[im 33/38  soft-tissue]
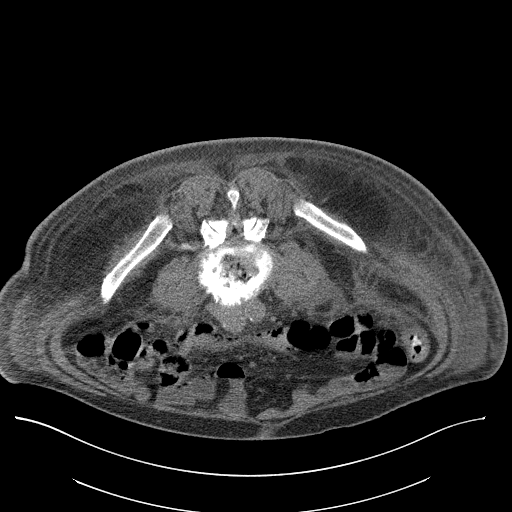
[im 33/38  lung]
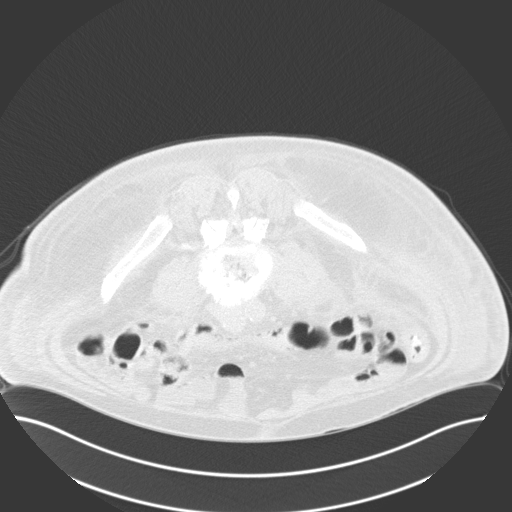
[im 35/38  lung]
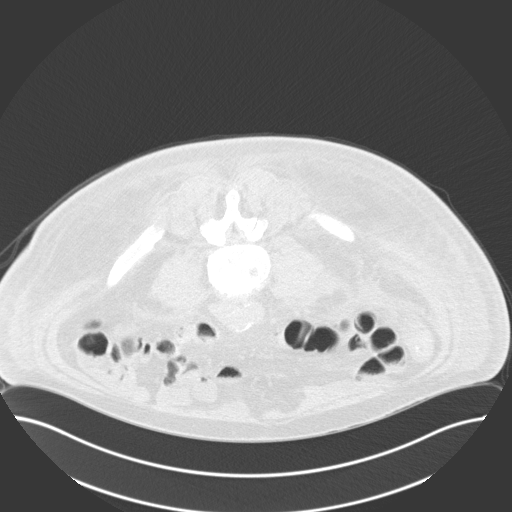
[im 36/38  soft-tissue]
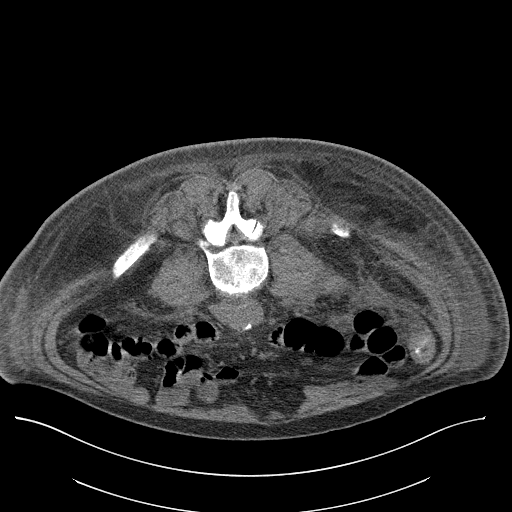
[im 36/38  lung]
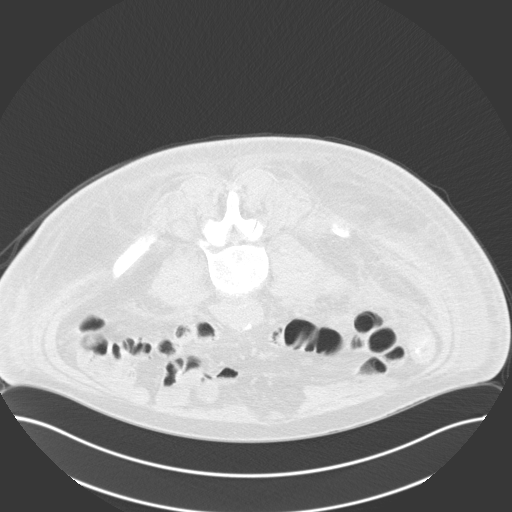

[15 of 32 positions shown; findings below may reference images not displayed]

Intravenous Fentanyl 633mcg and Versed 2mg were administered as
conscious sedation during continuous monitoring of the patient's
level of consciousness and physiological / cardiorespiratory status
by the radiology RN, with a total moderate sedation time of 10
minutes.

Under CT fluoroscopic guidance an 11-gauge Cook trocar bone needle
was advanced into the right iliac bone just lateral to the
sacroiliac joint. Once needle tip position was confirmed, core and
aspiration samples were obtained, submitted to pathology for
approval. Post procedure scans show no hematoma or fracture. Patient
tolerated procedure well.

COMPLICATIONS:
COMPLICATIONS
none
IMPRESSION: 1. Technically successful CT guided right iliac bone core and
aspiration biopsy.
